# Patient Record
Sex: Male | Born: 1960
Health system: Southern US, Community
[De-identification: ages and names within clinical notes are randomized; demographics above are authoritative.]

## PROBLEM LIST (undated history)

## (undated) DIAGNOSIS — I5030 Unspecified diastolic (congestive) heart failure: Secondary | ICD-10-CM

## (undated) DIAGNOSIS — I272 Pulmonary hypertension, unspecified: Secondary | ICD-10-CM

## (undated) DIAGNOSIS — J9691 Respiratory failure, unspecified with hypoxia: Secondary | ICD-10-CM

## (undated) DIAGNOSIS — J449 Chronic obstructive pulmonary disease, unspecified: Secondary | ICD-10-CM

## (undated) DIAGNOSIS — K219 Gastro-esophageal reflux disease without esophagitis: Secondary | ICD-10-CM

## (undated) DIAGNOSIS — I2723 Pulmonary hypertension due to lung diseases and hypoxia: Secondary | ICD-10-CM

## (undated) DIAGNOSIS — J40 Bronchitis, not specified as acute or chronic: Secondary | ICD-10-CM

## (undated) HISTORY — DX: Respiratory failure, unspecified with hypoxia: J96.91

## (undated) HISTORY — DX: Gastro-esophageal reflux disease without esophagitis: K21.9

## (undated) HISTORY — DX: Chronic obstructive pulmonary disease, unspecified: J44.9

## (undated) HISTORY — DX: Unspecified diastolic (congestive) heart failure: I50.30

## (undated) HISTORY — DX: Pulmonary hypertension, unspecified: I27.20

## (undated) HISTORY — DX: Pulmonary hypertension due to lung diseases and hypoxia: I27.23

## (undated) HISTORY — PX: HERNIA REPAIR: SHX51

---

## 2014-08-02 ENCOUNTER — Emergency Department (HOSPITAL_COMMUNITY)
Admission: EM | Admit: 2014-08-02 | Discharge: 2014-08-02 | Disposition: A | Discharge status: Home / Self Care | Payer: Self-pay | Attending: Emergency Medicine | Admitting: Emergency Medicine

## 2014-08-02 ENCOUNTER — Emergency Department (HOSPITAL_COMMUNITY): Payer: Self-pay

## 2014-08-02 ENCOUNTER — Encounter (HOSPITAL_COMMUNITY): Payer: Self-pay | Admitting: Emergency Medicine

## 2014-08-02 DIAGNOSIS — H6122 Impacted cerumen, left ear: Secondary | ICD-10-CM | POA: Insufficient documentation

## 2014-08-02 DIAGNOSIS — J40 Bronchitis, not specified as acute or chronic: Secondary | ICD-10-CM | POA: Insufficient documentation

## 2014-08-02 DIAGNOSIS — Z720 Tobacco use: Secondary | ICD-10-CM | POA: Insufficient documentation

## 2014-08-02 MED ORDER — HYDROCODONE-ACETAMINOPHEN 7.5-325 MG/15ML PO SOLN: 15.0000 mL | Freq: Every evening | ORAL | Status: DC | PRN

## 2014-08-02 MED ORDER — DM-GUAIFENESIN ER 30-600 MG PO TB12: 1.0000 | ORAL_TABLET | Freq: Two times a day (BID) | ORAL | Status: DC

## 2014-08-02 MED ORDER — ALBUTEROL SULFATE HFA 108 (90 BASE) MCG/ACT IN AERS: 1.0000 | INHALATION_SPRAY | Freq: Four times a day (QID) | RESPIRATORY_TRACT | Status: DC | PRN

## 2014-08-02 NOTE — ED Provider Notes (Signed)
CSN: [id number]     Arrival date & time 08/02/14  1557 History   This chart was scribed for non-physician practitioner, Lorrine Kin, PA-C working with [place], DO by Evelene Croon, ED Scribe. This patient was seen in room WTR8/WTR8 and the patient's care was started at 6:20 PM.    Chief Complaint  Patient presents with  . Cough    HPI Comments: Anthony Nelson is a 54 y.o. male who presents to the Emergency Department complaining of a persistent cough and chest congestion for a few weeks. Pt reports associated subjective fever, chills and night sweats.  That started last night and resolved this am. Pt has been taking ibuprofen with relief of fever; his last dose was this am around 1000. Pt also reports mild wheezing. He is a current everyday smoker. He denies CP, rash, and SOB. He also denies sick contacts but notes he works in a [place].   The history is provided by the patient. No language interpreter was used.    History reviewed. No pertinent past medical history. History reviewed. No pertinent past surgical history. History reviewed. No pertinent family history. History  Substance Use Topics  . Smoking status: Current Every Day Smoker -- 0.50 packs/day    Types: Cigarettes  . Smokeless tobacco: Not on file  . Alcohol Use: Yes     Comment: Socially     Review of Systems  Constitutional: Positive for fever, chills and diaphoresis.  HENT: Positive for congestion.   Respiratory: Positive for cough and wheezing. Negative for shortness of breath.   Cardiovascular: Negative for chest pain.  Skin: Negative for rash.  All other systems reviewed and are negative.     Allergies  Erythromycin  Home Medications   Prior to Admission medications   Not on File   BP 124/95 mmHg  Pulse 95  Temp(Src) 98 F (36.7 C) (Oral)  Resp 18  SpO2 98% Physical Exam  Constitutional: He is oriented to person, place, and time. He appears well-developed and well-nourished. No distress.   HENT:  Head: Normocephalic and atraumatic.  Left Ear: Tympanic membrane normal.  Nose: Rhinorrhea present.  Mouth/Throat: Posterior oropharyngeal erythema present. No oropharyngeal exudate, posterior oropharyngeal edema or tonsillar abscesses.  Left canal occluded by cerumen.  Neck: Neck supple.  Cardiovascular: Normal rate and regular rhythm.   Pulmonary/Chest: Effort normal. No respiratory distress. He has no wheezes. He has no rales.  Lymphadenopathy:       Head (right side): No tonsillar adenopathy present.       Head (left side): No tonsillar adenopathy present.       Right cervical: No superficial cervical and no posterior cervical adenopathy present.      Left cervical: No superficial cervical and no posterior cervical adenopathy present.  Neurological: He is oriented to person, place, and time.  Skin: Skin is warm and dry. He is not diaphoretic.  Psychiatric: He has a normal mood and affect. His behavior is normal.  Nursing note and vitals reviewed.   ED Course  Procedures   DIAGNOSTIC STUDIES:  Oxygen Saturation is 98% on RA, normal by my interpretation.    COORDINATION OF CARE:  6:31 PM Updated pt with XR results. Will discharge with inhaler and referral to PCP. Discussed treatment plan with pt and wife at bedside and pt agreed to plan.  Labs Review Labs Reviewed - No data to display  Imaging Review Dg Chest 2 View  08/02/2014   CLINICAL DATA:  Shortness of  breath and cough for 2 weeks.  Smoker.  EXAM: CHEST  2 VIEW  COMPARISON:  None.  FINDINGS: The chest is hyperexpanded with attenuation of the pulmonary vasculature. The lungs are clear. Heart size is normal. There is no pneumothorax or pleural effusion.  IMPRESSION: Emphysema without acute disease.   Electronically Signed   By: Inge Rise M.D.   On: 08/02/2014 16:46     EKG Interpretation None      MDM   Final diagnoses:  Bronchitis   Patient with cough, afebrile in ED, SPO2 98%, chest x-ray  negative for acute changes. Plan to treat for viral bronchitis. Resources given. Discussed imaging results, and treatment plan with the patient. Return precautions given. Reports understanding and no other concerns at this time.  Patient is stable for discharge at this time.  Meds given in ED:  Medications - No data to display  New Prescriptions   ALBUTEROL (PROVENTIL HFA;VENTOLIN HFA) 108 (90 BASE) MCG/ACT INHALER    Inhale 1 puff into the lungs every 6 (six) hours as needed for wheezing or shortness of breath.   DEXTROMETHORPHAN-GUAIFENESIN (MUCINEX DM) 30-600 MG PER 12 HR TABLET    Take 1 tablet by mouth 2 (two) times daily.   HYDROCODONE-ACETAMINOPHEN (HYCET) 7.5-325 MG/15 ML SOLUTION    Take 15 mLs by mouth at bedtime as needed (cough).   I personally performed the services described in this documentation, which was scribed in my presence. The recorded information has been reviewed and is accurate.     Harvie Heck, PA-C 08/02/14 [place], DO 08/03/14 [id number]

## 2014-08-02 NOTE — ED Notes (Signed)
Pt c/o chills, night sweats, nasal congestion.

## 2014-08-02 NOTE — ED Notes (Signed)
Pt reports a cough that has off and on for a couple of weeks. Pt reports that last night he had a sharp pain in his right rib cage area, no pain at this time.

## 2014-08-02 NOTE — Discharge Instructions (Signed)
Call for a follow up appointment with a Family or Primary Care Provider.  Return if Symptoms worsen.   Take medication as prescribed.  Ibuprofen and tylenol for body aches and pain. Do not drink alcohol or operate heavy machinery while taking cough syrup.  Emergency Department Resource Guide 1) Find a Doctor and Pay Out of Pocket Although you won't have to find out who is covered by your insurance plan, it is a good idea to ask around and get recommendations. You will then need to call the office and see if the doctor you have chosen will accept you as a new patient and what types of options they offer for patients who are self-pay. Some doctors offer discounts or will set up payment plans for their patients who do not have insurance, but you will need to ask so you aren't surprised when you get to your appointment.  2) Contact Your Local Health Department Not all health departments have doctors that can see patients for sick visits, but many do, so it is worth a call to see if yours does. If you don't know where your local health department is, you can check in your phone book. The CDC also has a tool to help you locate your state's health department, and many state websites also have listings of all of their local health departments.  3) Find a [facility] If your illness is not likely to be very severe or complicated, you may want to try a walk in clinic. These are popping up all over the country in pharmacies, drugstores, and shopping centers. They're usually staffed by nurse practitioners or physician assistants that have been trained to treat common illnesses and complaints. They're usually fairly quick and inexpensive. However, if you have serious medical issues or chronic medical problems, these are probably not your best option.  No Primary Care Doctor: - Call Health Connect at  [phone] - they can help you locate a primary care doctor that  accepts your insurance, provides certain  services, etc. - Physician Referral Service- [phone]  Chronic Pain Problems: Organization         Address  Phone   Notes  [facility]  [phone] Patients need to be referred by their primary care doctor.   Medication Assistance: Organization         Address  Phone   Notes  [facility] Medication [facility] [address] [place], [place] [address] [phone] --Must be a resident of [facility] -- Must have NO insurance coverage whatsoever (no Medicaid/ Medicare, etc.) -- The pt. MUST have a primary care doctor that directs their care regularly and follows them in the community   MedAssist  [phone]   [employer]  [phone]    Agencies that provide inexpensive medical care: Organization         Address  Phone   Notes  [place]  [phone]   [facility]    [phone]   [facility] [place], [place] [address] [phone]   [place] [address], [place] [phone]   Planned Parenthood    ([phone]   [facility]    [phone]   [place] [address], [place] Phone:  [phone], Fax:  [phone] Hours of Operation:  9 am - 6 pm, M-F.  Also accepts Medicaid/Medicare and self-pay.  [facility] for [address], [address], [place] Phone: [phone], Fax: [phone]. Hours of Operation:  8:30 am - 5:30 pm, M-F.  Also accepts Medicaid and self-pay.  [facility] [address] [place] Phone: [phone]   [place], [place], [place] [phone], Ext. [phone] Mondays & Thursdays: 7-9 AM.  First 15 patients are seen on a first come, first serve basis.    [place] Providers:  Organization         Address  Phone   Notes  [facility] [address], [place] [phone] Also accepts self-pay patients.  [facility] [address] [place], [place]  [phone]   [place], [address], [place] [phone]   [facility] [address], [place] [phone]   Lucianne Lei [address], [place]   [phone] Only accepts [place] Access [place] patients after they have their name applied to their card.   Self-Pay (no insurance) in [facility] Xxx:  Organization         Address  Phone   Notes  Sickle Cell Patients, [facility] [place] [phone]   [facility] [place] [phone]   [facility] [place]  [place], [place], [place] [phone]   [facility]/Dr. Osei-Bonsu  [address] [place] or [address], [place] [phone] Phone number for both [place] and [place] locations is the same.  Urgent Medical and [facility] [address], [place] [phone]   [facility] [address] [place] or [address] [phone]   [facility] [address] [place] [phone], phone; [phone], fax Sees patients 1st and 3rd Saturday of every month.  Must not qualify for public or private insurance (i.e. Medicaid, Medicare, [place] Health Choice, Veterans' Benefits)  Household income should be no more than 200% of the poverty level The clinic cannot treat you if you are pregnant or think you are pregnant  Sexually transmitted diseases are not treated at the clinic.    Dental Care: Organization         Address  Phone  Notes  [facility] Department of [facility] [place] [phone] Accepts  children up to age 57 who are enrolled in [place] or [place]; pregnant women with a Medicaid card; and children who have applied for Medicaid or [place] Health Choice, but were declined, whose parents can pay a reduced fee at time of service.  [facility] Department of [facility]  [address], [place] [phone] Accepts children up to age 80 who are enrolled in [place] or [place]; pregnant women with a Medicaid card; and children who have applied for Medicaid or [place] Health Choice, but were declined, whose parents can pay a reduced fee at time of service.  [facility] Adult [facility] Access PROGRAM  [place] [phone] Patients are seen by appointment only. Walk-ins are not accepted. [place] will see patients 42 years of age and older. Monday - Tuesday (  8am-5pm) Most Wednesdays (8:30-5pm) $30 per visit, cash only  [facility] Adult Dental Access PROGRAM  [address], [facility] [phone] Patients are seen by appointment only. Walk-ins are not accepted. [place] will see patients 78 years of age and older. One Wednesday Evening (Monthly: Volunteer Based).  $30 per visit, cash only  [place]  [phone] for adults; Children under age 55, call Graduate Pediatric Dentistry at [phone]. Children aged 17-14, please call [phone] to request a pediatric application.  Dental services are provided in all areas of dental care including fillings, crowns and bridges, complete and partial dentures, implants, gum treatment, root canals, and extractions. Preventive care is also provided. Treatment is provided to both adults and children. Patients are selected via a lottery and there is often a waiting list.   [facility], [facility] [address], [place]  [phone] [email or web address]   [facility] [address] [place], [place] [phone], Ext. [phone] Second and  Fourth Thursday of each month, opens at 6:30 AM; Clinic ends at 9 AM.  Patients are seen on a first-come first-served basis, and a limited number are seen during each clinic.   [facility]  [address] Hillard Danker [place], [place] [phone]   Eligibility Requirements You must have lived in [place], [place], or [place] counties for at least the last three months.   You cannot be eligible for state or federal sponsored [employer], including [employer], [place], or [employer].   You generally cannot be eligible for healthcare insurance through your employer.    How to apply: Eligibility screenings are held every Tuesday and Wednesday afternoon from 1:00 pm until 4:00 pm. You do not need an appointment for the interview!  [facility] [address] [place], [place]   Head of the [place]  [place] Department  [place]  [phone]    Behavioral Health Resources in the Community: Intensive Outpatient Programs Organization         Address  Phone  Notes  [place]. [address] [place], [place] [phone]   [facility] [address], [place], [place]   ADS: Alcohol & Drug Svcs [address] [place], [place]   [place] [address],  [place], [place] or [phone]   Substance Abuse Resources Organization         Address  Phone  Notes  Alcohol and Drug Services  [phone]   [place]  [phone]   [facility] [place]   Chinita Pester  [phone]   Residential & Outpatient Substance Abuse Program  [phone]   Psychological Services Organization         Address  Phone  Notes  [facility] [place]  [place]  [phone]   [place]  [address], [place] or [phone]    Mobile Crisis Teams Organization         Address  Phone  Notes  [facility], Mobile Crisis Care Unit  [phone]   Assertive Psychotherapeutic Services  [address] [place], [place]   [place] [address], [place] [phone]    Self-Help/Support Groups Organization         Address  Phone  Notes  Mental Health Assoc. of [place] - variety of support groups  [place] Call for more information  Narcotics Anonymous (NA), [facility] [address], [employer] [place]  2 meetings at this location   Special educational needs teacher         Address  Phone  Notes  ASAP Residential Treatment [place],    [place]  [phone]   [facility]  [address], [place] [address], [place], [place]   [place], [place] [phone] Admissions: 8am-3pm M-F  Incentives Substance [place] [address]    [place], [place] [phone]   [facility] [address] [place], [place], [place]   [facility] [address]  [place], [place]   [facility] Programs - Intensive Outpatient [address], [place], [place]   [facility] ([place] [place].,  [place], [place] [phone] or [phone]   [facility] (RTS) [address] [place], [place] Accepts Medicaid  [facility] [place] [address],  [place] [phone] Substance Abuse/Addiction Treatment   [facility] Organization         Address  Phone  Notes  [facility]  [phone]   Domenic Schwab, PhD [address] Arlis [place], [place]   [phone] or [phone]   [place], [place] [phone]   [facility] [address], [place], [place] [phone] Insurance/Medicaid/sponsorship through [facility] and Families [address]                                    [place], [place] [phone] [place] [address], [place] [phone]    Dr. Adele Schilder  [phone]   [facility] Dept. 1) [address], [place] 2) [place] 3)  [place] [address], [place] [phone]  [phone]   [place] [phone] or [phone] (After Hours)

## 2018-02-08 ENCOUNTER — Emergency Department (HOSPITAL_COMMUNITY): Payer: Self-pay

## 2018-02-08 ENCOUNTER — Inpatient Hospital Stay (HOSPITAL_COMMUNITY)
Admission: EM | Admit: 2018-02-08 | Discharge: 2018-02-10 | DRG: 190 | Disposition: A | Discharge status: Home / Self Care | Payer: Self-pay | Attending: Family Medicine | Admitting: Family Medicine

## 2018-02-08 ENCOUNTER — Other Ambulatory Visit: Payer: Self-pay

## 2018-02-08 ENCOUNTER — Inpatient Hospital Stay (HOSPITAL_COMMUNITY): Payer: Self-pay

## 2018-02-08 ENCOUNTER — Encounter (HOSPITAL_COMMUNITY): Payer: Self-pay

## 2018-02-08 DIAGNOSIS — J441 Chronic obstructive pulmonary disease with (acute) exacerbation: Principal | ICD-10-CM | POA: Diagnosis present

## 2018-02-08 DIAGNOSIS — J9601 Acute respiratory failure with hypoxia: Secondary | ICD-10-CM | POA: Diagnosis present

## 2018-02-08 DIAGNOSIS — K219 Gastro-esophageal reflux disease without esophagitis: Secondary | ICD-10-CM | POA: Diagnosis present

## 2018-02-08 DIAGNOSIS — Z881 Allergy status to other antibiotic agents status: Secondary | ICD-10-CM

## 2018-02-08 DIAGNOSIS — F17210 Nicotine dependence, cigarettes, uncomplicated: Secondary | ICD-10-CM | POA: Diagnosis present

## 2018-02-08 DIAGNOSIS — I2729 Other secondary pulmonary hypertension: Secondary | ICD-10-CM | POA: Diagnosis present

## 2018-02-08 DIAGNOSIS — I5031 Acute diastolic (congestive) heart failure: Secondary | ICD-10-CM | POA: Diagnosis present

## 2018-02-08 DIAGNOSIS — K761 Chronic passive congestion of liver: Secondary | ICD-10-CM | POA: Diagnosis present

## 2018-02-08 DIAGNOSIS — I361 Nonrheumatic tricuspid (valve) insufficiency: Secondary | ICD-10-CM

## 2018-02-08 DIAGNOSIS — M7989 Other specified soft tissue disorders: Secondary | ICD-10-CM

## 2018-02-08 DIAGNOSIS — I5082 Biventricular heart failure: Secondary | ICD-10-CM | POA: Diagnosis present

## 2018-02-08 DIAGNOSIS — Z87891 Personal history of nicotine dependence: Secondary | ICD-10-CM

## 2018-02-08 HISTORY — DX: Bronchitis, not specified as acute or chronic: J40

## 2018-02-08 LAB — I-STAT CHEM 8, ED
BUN: 24 mg/dL — AB (ref 6–20)
CREATININE: 0.9 mg/dL (ref 0.61–1.24)
Calcium, Ion: 0.96 mmol/L — ABNORMAL LOW (ref 1.15–1.40)
Chloride: 91 mmol/L — ABNORMAL LOW (ref 98–111)
Glucose, Bld: 108 mg/dL — ABNORMAL HIGH (ref 70–99)
HCT: 56 % — ABNORMAL HIGH (ref 39.0–52.0)
HEMOGLOBIN: 19 g/dL — AB (ref 13.0–17.0)
POTASSIUM: 3.4 mmol/L — AB (ref 3.5–5.1)
Sodium: 136 mmol/L (ref 135–145)
TCO2: 35 mmol/L — ABNORMAL HIGH (ref 22–32)

## 2018-02-08 LAB — URINALYSIS, ROUTINE W REFLEX MICROSCOPIC
Bacteria, UA: NONE SEEN
Bilirubin Urine: NEGATIVE
GLUCOSE, UA: NEGATIVE mg/dL
Hgb urine dipstick: NEGATIVE
Ketones, ur: NEGATIVE mg/dL
Leukocytes, UA: NEGATIVE
Nitrite: NEGATIVE
PROTEIN: 30 mg/dL — AB
Specific Gravity, Urine: >1.046 — ABNORMAL HIGH (ref 1.005–1.030)
pH: 6 (ref 5.0–8.0)

## 2018-02-08 LAB — CBC WITH DIFFERENTIAL/PLATELET
Basophils Absolute: 0 10*3/uL (ref 0.0–0.1)
Basophils Relative: 0 %
Eosinophils Absolute: 0 10*3/uL (ref 0.0–0.7)
Eosinophils Relative: 1 %
HCT: 52.8 % — ABNORMAL HIGH (ref 39.0–52.0)
Hemoglobin: 17.8 g/dL — ABNORMAL HIGH (ref 13.0–17.0)
LYMPHS ABS: 1 10*3/uL (ref 0.7–4.0)
LYMPHS PCT: 18 %
MCH: 34.7 pg — ABNORMAL HIGH (ref 26.0–34.0)
MCHC: 33.7 g/dL (ref 30.0–36.0)
MCV: 102.9 fL — AB (ref 78.0–100.0)
Monocytes Absolute: 0.5 10*3/uL (ref 0.1–1.0)
Monocytes Relative: 9 %
NEUTROS PCT: 72 %
Neutro Abs: 4.1 10*3/uL (ref 1.7–7.7)
Platelets: 170 10*3/uL (ref 150–400)
RBC: 5.13 MIL/uL (ref 4.22–5.81)
RDW: 14.5 % (ref 11.5–15.5)
WBC: 5.7 10*3/uL (ref 4.0–10.5)

## 2018-02-08 LAB — COMPREHENSIVE METABOLIC PANEL
ALT: 135 U/L — ABNORMAL HIGH (ref 0–44)
AST: 69 U/L — AB (ref 15–41)
Albumin: 3.6 g/dL (ref 3.5–5.0)
Alkaline Phosphatase: 66 U/L (ref 38–126)
Anion gap: 12 (ref 5–15)
BUN: 23 mg/dL — AB (ref 6–20)
CO2: 33 mmol/L — ABNORMAL HIGH (ref 22–32)
Calcium: 8.5 mg/dL — ABNORMAL LOW (ref 8.9–10.3)
Chloride: 94 mmol/L — ABNORMAL LOW (ref 98–111)
Creatinine, Ser: 0.88 mg/dL (ref 0.61–1.24)
GFR calc non Af Amer: >60 mL/min (ref >60)
Glucose, Bld: 109 mg/dL — ABNORMAL HIGH (ref 70–99)
POTASSIUM: 3.7 mmol/L (ref 3.5–5.1)
Sodium: 139 mmol/L (ref 135–145)
Total Bilirubin: 0.9 mg/dL (ref 0.3–1.2)
Total Protein: 6.4 g/dL — ABNORMAL LOW (ref 6.5–8.1)

## 2018-02-08 LAB — ECHOCARDIOGRAM COMPLETE
Height: 72 in
Weight: 2064 oz

## 2018-02-08 LAB — TROPONIN I
TROPONIN I: 0.03 ng/mL — AB (ref <0.03)
Troponin I: 0.03 ng/mL (ref <0.03)
Troponin I: <0.03 ng/mL (ref <0.03)

## 2018-02-08 LAB — BRAIN NATRIURETIC PEPTIDE: B NATRIURETIC PEPTIDE 5: 825.4 pg/mL — AB (ref 0.0–100.0)

## 2018-02-08 LAB — I-STAT CG4 LACTIC ACID, ED
LACTIC ACID, VENOUS: 0.79 mmol/L (ref 0.5–1.9)
Lactic Acid, Venous: 2.05 mmol/L (ref 0.5–1.9)

## 2018-02-08 LAB — I-STAT TROPONIN, ED: Troponin i, poc: 0.03 ng/mL (ref 0.00–0.08)

## 2018-02-08 MED ORDER — GUAIFENESIN-DM 100-10 MG/5ML PO SYRP: 5.0000 mL | ORAL_SOLUTION | ORAL | Status: DC | PRN

## 2018-02-08 MED ORDER — ACETAMINOPHEN 325 MG PO TABS: 650.0000 mg | ORAL_TABLET | Freq: Four times a day (QID) | ORAL | Status: DC | PRN

## 2018-02-08 MED ORDER — ALBUTEROL SULFATE (2.5 MG/3ML) 0.083% IN NEBU: 2.5000 mg | INHALATION_SOLUTION | RESPIRATORY_TRACT | Status: DC | PRN

## 2018-02-08 MED ORDER — ENOXAPARIN SODIUM 40 MG/0.4ML ~~LOC~~ SOLN
40.0000 mg | SUBCUTANEOUS | Status: DC
  Administered 2018-02-08 – 2018-02-09 (×2): 40 mg via SUBCUTANEOUS
  Filled 2018-02-08 (×2): qty 0.4

## 2018-02-08 MED ORDER — NICOTINE 14 MG/24HR TD PT24
14.0000 mg | MEDICATED_PATCH | Freq: Every day | TRANSDERMAL | Status: DC
  Administered 2018-02-08 – 2018-02-10 (×3): 14 mg via TRANSDERMAL
  Filled 2018-02-08 (×3): qty 1

## 2018-02-08 MED ORDER — ALBUTEROL SULFATE (2.5 MG/3ML) 0.083% IN NEBU
5.0000 mg | INHALATION_SOLUTION | Freq: Once | RESPIRATORY_TRACT | Status: AC
  Administered 2018-02-08: 5 mg via RESPIRATORY_TRACT
  Filled 2018-02-08: qty 6

## 2018-02-08 MED ORDER — POLYETHYLENE GLYCOL 3350 17 G PO PACK
17.0000 g | PACK | Freq: Every day | ORAL | Status: DC | PRN
  Administered 2018-02-09: 17 g via ORAL
  Filled 2018-02-08: qty 1

## 2018-02-08 MED ORDER — METHYLPREDNISOLONE SODIUM SUCC 125 MG IJ SOLR
125.0000 mg | Freq: Once | INTRAMUSCULAR | Status: AC
  Administered 2018-02-08: 125 mg via INTRAVENOUS
  Filled 2018-02-08: qty 2

## 2018-02-08 MED ORDER — FUROSEMIDE 10 MG/ML IJ SOLN
40.0000 mg | Freq: Two times a day (BID) | INTRAMUSCULAR | Status: DC
  Administered 2018-02-08: 40 mg via INTRAVENOUS
  Filled 2018-02-08 (×2): qty 4

## 2018-02-08 MED ORDER — IPRATROPIUM-ALBUTEROL 0.5-2.5 (3) MG/3ML IN SOLN
3.0000 mL | Freq: Three times a day (TID) | RESPIRATORY_TRACT | Status: DC
  Administered 2018-02-08 – 2018-02-10 (×6): 3 mL via RESPIRATORY_TRACT
  Filled 2018-02-08 (×7): qty 3

## 2018-02-08 MED ORDER — ACETAMINOPHEN 650 MG RE SUPP: 650.0000 mg | Freq: Four times a day (QID) | RECTAL | Status: DC | PRN

## 2018-02-08 MED ORDER — ORAL CARE MOUTH RINSE
15.0000 mL | Freq: Two times a day (BID) | OROMUCOSAL | Status: DC
  Administered 2018-02-08 – 2018-02-10 (×2): 15 mL via OROMUCOSAL

## 2018-02-08 MED ORDER — ONDANSETRON HCL 4 MG PO TABS: 4.0000 mg | ORAL_TABLET | Freq: Four times a day (QID) | ORAL | Status: DC | PRN

## 2018-02-08 MED ORDER — METHYLPREDNISOLONE SODIUM SUCC 40 MG IJ SOLR
40.0000 mg | Freq: Two times a day (BID) | INTRAMUSCULAR | Status: DC
  Administered 2018-02-09 – 2018-02-10 (×3): 40 mg via INTRAVENOUS
  Filled 2018-02-08 (×3): qty 1

## 2018-02-08 MED ORDER — IPRATROPIUM-ALBUTEROL 0.5-2.5 (3) MG/3ML IN SOLN
3.0000 mL | Freq: Once | RESPIRATORY_TRACT | Status: AC
  Administered 2018-02-08: 3 mL via RESPIRATORY_TRACT
  Filled 2018-02-08: qty 3

## 2018-02-08 MED ORDER — DOXYCYCLINE HYCLATE 100 MG PO TABS
100.0000 mg | ORAL_TABLET | Freq: Two times a day (BID) | ORAL | Status: DC
  Administered 2018-02-08 – 2018-02-10 (×5): 100 mg via ORAL
  Filled 2018-02-08 (×5): qty 1

## 2018-02-08 MED ORDER — IOPAMIDOL (ISOVUE-370) INJECTION 76%
INTRAVENOUS | Status: AC
  Administered 2018-02-08: 100 mL
  Filled 2018-02-08: qty 100

## 2018-02-08 MED ORDER — ONDANSETRON HCL 4 MG/2ML IJ SOLN
4.0000 mg | Freq: Four times a day (QID) | INTRAMUSCULAR | Status: DC | PRN
Start: 2018-02-08 — End: 2018-02-10

## 2018-02-08 MED ORDER — IPRATROPIUM-ALBUTEROL 0.5-2.5 (3) MG/3ML IN SOLN: 3.0000 mL | Freq: Four times a day (QID) | RESPIRATORY_TRACT | Status: DC

## 2018-02-08 NOTE — ED Notes (Signed)
BLOOD CULTURE X 1 OBTAINED RT FOREARM

## 2018-02-08 NOTE — ED Notes (Signed)
ADMITTING Provider at bedside.

## 2018-02-08 NOTE — ED Notes (Signed)
BLOOD CULTURES DISCARDED PER ADMISSION MD.

## 2018-02-08 NOTE — Progress Notes (Signed)
Patient O2 decreased to 1 L post-tx; patient 97% on 2 L [place]. RT will continue to monitor patient.

## 2018-02-08 NOTE — Progress Notes (Signed)
  Echocardiogram 2D Echocardiogram has been performed.  Anthony Nelson 02/08/2018, 3:37 PM

## 2018-02-08 NOTE — ED Triage Notes (Signed)
Patient c/o feeling fatigue x 2 weeks and bilateral leg swelling x 2 days. Patient also reports tht he is SOB at times. Sats in triage 73%on room air.

## 2018-02-08 NOTE — ED Notes (Signed)
Date and time results received: 02/08/18  (use smartphrase ".now" to insert current time)8:52 AM Test: LACTIC Critical Value: 2.05  Name of Provider Notified: EDP REES  Orders Received? Or Actions Taken? NEW ORDERS

## 2018-02-08 NOTE — ED Notes (Signed)
AWARE OF NEED FOR URINE. URINAL AT BEDSIDE

## 2018-02-08 NOTE — ED Notes (Signed)
MD AND RN NOTIFIED OF PATIENT'S LACTIC ACID OF 2.05

## 2018-02-08 NOTE — ED Notes (Signed)
ED Provider at bedside. EDP REES AT BEDSIDE. DISCUSSED RESULTS AND PLAN OF CARE. DECREASED 02 TO 2LNC AND WILL REASSESS PT. PT WITHOUT PAIN AND COMFORTABLE

## 2018-02-08 NOTE — ED Provider Notes (Signed)
[facility] DEPT Provider Note   CSN: [id number] Arrival date & time: 02/08/18  0746     History   Chief Complaint Chief Complaint  Patient presents with  . Fatigue  . Leg Swelling  . Shortness of Breath    HPI Anthony Nelson is a 57 y.o. male.  The history is provided by the patient. No language interpreter was used.  Shortness of Breath    Anthony Nelson is a 57 y.o. male who presents to the Emergency Department complaining of fatigue. He presents to the emergency department complaining of fatigue and generalized weakness for the last few weeks. Yesterday he noted swelling to his legs. He does endorse small amount of bleeding at the end of bowel movements that is been ongoing for a long time. He attributes this bleeding to hemorrhoids. He denies any fevers, chest pain, abdominal pain, nausea, vomiting, diarrhea. He does have occasional cough productive of phlegm. He has no known medical problems and takes no medications. He does smoke. Occasional alcohol, no drug use. Past Medical History:  Diagnosis Date  . Bronchitis     Patient Active Problem List   Diagnosis Date Noted  . COPD exacerbation ([place]) 02/08/2018  . Leg swelling 02/08/2018  . Tobacco abuse 02/08/2018    Past Surgical History:  Procedure Laterality Date  . HERNIA REPAIR          Home Medications    Prior to Admission medications   Medication Sig Start Date End Date Taking? Authorizing Provider  ibuprofen (ADVIL,MOTRIN) 200 MG tablet Take 800 mg by mouth daily as needed for moderate pain.   Yes [provider]    Family History Family History  Problem Relation Age of Onset  . Cancer Mother   . Cancer Father     Social History Social History   Tobacco Use  . Smoking status: Current Every Day Smoker    Packs/day: 0.50    Types: Cigarettes  . Smokeless tobacco: Never Used  Substance Use Topics  . Alcohol use: Yes    Comment: Socially   . Drug use: No       Allergies   Erythromycin   Review of Systems Review of Systems  Respiratory: Positive for shortness of breath.   All other systems reviewed and are negative.    Physical Exam Updated Vital Signs BP 123/85 (BP Location: Left Arm)   Pulse 96   Temp 97.9 F (36.6 C) (Oral)   Resp 18   Ht 6' (1.829 m)   Wt 58.5 kg   SpO2 92%   BMI 17.50 kg/m   Physical Exam  Constitutional: He is oriented to person, place, and time. He appears well-developed and well-nourished.  HENT:  Head: Normocephalic and atraumatic.  Cardiovascular: Regular rhythm.  No murmur heard. tachycardic  Pulmonary/Chest: Effort normal. No respiratory distress.  Tachypneic.  Decreased air movement bilaterally  Abdominal: Soft. There is no tenderness. There is no rebound and no guarding.  Musculoskeletal: He exhibits no tenderness.  2-3+ pitting edema to LLE. 1+ pitting edema to RLE.  Neurological: He is alert and oriented to person, place, and time.  Skin: Skin is warm and dry.  Psychiatric: He has a normal mood and affect. His behavior is normal.  Nursing note and vitals reviewed.    ED Treatments / Results  Labs (all labs ordered are listed, but only abnormal results are displayed) Labs Reviewed  URINALYSIS, ROUTINE W REFLEX MICROSCOPIC - Abnormal; Notable for the following components:  Result Value   Specific Gravity, Urine >1.046 (*)    Protein, ur 30 (*)    All other components within normal limits  COMPREHENSIVE METABOLIC PANEL - Abnormal; Notable for the following components:   Chloride 94 (*)    CO2 33 (*)    Glucose, Bld 109 (*)    BUN 23 (*)    Calcium 8.5 (*)    Total Protein 6.4 (*)    AST 69 (*)    ALT 135 (*)    All other components within normal limits  CBC WITH DIFFERENTIAL/PLATELET - Abnormal; Notable for the following components:   Hemoglobin 17.8 (*)    HCT 52.8 (*)    MCV 102.9 (*)    MCH 34.7 (*)    All other components within normal limits  TROPONIN I -  Abnormal; Notable for the following components:   Troponin I 0.03 (*)    All other components within normal limits  BRAIN NATRIURETIC PEPTIDE - Abnormal; Notable for the following components:   B Natriuretic Peptide 825.4 (*)    All other components within normal limits  I-STAT CHEM 8, ED - Abnormal; Notable for the following components:   Potassium 3.4 (*)    Chloride 91 (*)    BUN 24 (*)    Glucose, Bld 108 (*)    Calcium, Ion 0.96 (*)    TCO2 35 (*)    Hemoglobin 19.0 (*)    HCT 56.0 (*)    All other components within normal limits  I-STAT CG4 LACTIC ACID, ED - Abnormal; Notable for the following components:   Lactic Acid, Venous 2.05 (*)    All other components within normal limits  HIV ANTIBODY (ROUTINE TESTING)  TROPONIN I  TROPONIN I  HEPATITIS PANEL, ACUTE  I-STAT TROPONIN, ED  I-STAT CG4 LACTIC ACID, ED    EKG EKG Interpretation  Date/Time:  Saturday February 08 2018 08:16:21 EDT Ventricular Rate:  95 PR Interval:    QRS Duration: 94 QT Interval:  394 QTC Calculation: 496 R Axis:   124 Text Interpretation:  Sinus rhythm Biatrial enlargement Probable RVH w/ secondary repol abnormality Borderline prolonged QT interval baseline wander in II, III,  aVR, aVL, aVF, V4, V5, V6 no prior available for comparison Confirmed by Quintella Reichert [phone]) on 02/08/2018 8:43:21 AM   Radiology Ct Angio Chest Pe W/cm &/or Wo Cm  Result Date: 02/08/2018 CLINICAL DATA:  Fatigue, shortness of breath, hypoxia and bilateral lower extremity edema. EXAM: CT ANGIOGRAPHY CHEST WITH CONTRAST TECHNIQUE: Multidetector CT imaging of the chest was performed using the standard protocol during bolus administration of intravenous contrast. Multiplanar CT image reconstructions and MIPs were obtained to evaluate the vascular anatomy. CONTRAST:  145m ISOVUE-370 IOPAMIDOL (ISOVUE-370) INJECTION 76% COMPARISON:  Chest x-ray earlier today. FINDINGS: Cardiovascular: Satisfactory opacification of the pulmonary  arteries to the segmental level. No evidence of pulmonary embolism. Central pulmonary arteries are normal in caliber. Right ventricle and right atrium are dilated and there is some reflux of contrast into the intrahepatic IVC and central hepatic veins. Findings are consistent with some component of underlying right heart failure and elevated right heart pressures. The thoracic aorta is poorly opacified but shows normal caliber without evidence of aneurysmal disease. No pericardial fluid identified. No significant calcified coronary artery plaque identified. Mediastinum/Nodes: No enlarged mediastinal, hilar, or axillary lymph nodes. Thyroid gland, trachea, and esophagus demonstrate no significant findings. Lungs/Pleura: There is evidence of moderately advanced emphysematous lung disease throughout both lungs and predominantly in the  upper lung zones. Mild scarring present at both lung bases. There is no evidence of pulmonary edema, consolidation, pneumothorax, nodule or pleural fluid. Upper Abdomen: No acute abnormality. Musculoskeletal: No chest wall abnormality. No acute or significant osseous findings. Review of the MIP images confirms the above findings. IMPRESSION: 1. No evidence of pulmonary embolism. 2. Evidence of elevated right heart pressures and underlying right heart failure with dilated right-sided heart chambers and reflux of contrast into the intrahepatic IVC and central hepatic veins. No overt airspace edema or pleural fluid. 3. Moderately advanced emphysematous lung disease, predominantly in the upper lung zones bilaterally. No acute pulmonary findings. Emphysema (ICD10-J43.9). Electronically Signed   By: Aletta Edouard M.D.   On: 02/08/2018 09:58   Dg Chest Port 1 View  Result Date: 02/08/2018 CLINICAL DATA:  Fatigue for 2 weeks.  Bilateral leg swelling. EXAM: PORTABLE CHEST 1 VIEW COMPARISON:  August 02, 2014 FINDINGS: Evaluation of heart size is limited due to portable technique. The heart  appears borderline but this may be exaggerated. No pneumothorax. No nodules or masses. No focal infiltrates. No overt edema. IMPRESSION: Evaluation the cardiac size is limited due to portable technique. No edema or gross cardiomegaly. Electronically Signed   By: Dorise Bullion III M.D   On: 02/08/2018 09:08    Procedures Procedures (including critical care time) CRITICAL CARE Performed by: Quintella Reichert   Total critical care time: 35 minutes  Critical care time was exclusive of separately billable procedures and treating other patients.  Critical care was necessary to treat or prevent imminent or life-threatening deterioration.  Critical care was time spent personally by me on the following activities: development of treatment plan with patient and/or surrogate as well as nursing, discussions with consultants, evaluation of patient's response to treatment, examination of patient, obtaining history from patient or surrogate, ordering and performing treatments and interventions, ordering and review of laboratory studies, ordering and review of radiographic studies, pulse oximetry and re-evaluation of patient's condition.  Medications Ordered in ED Medications  enoxaparin (LOVENOX) injection 40 mg (has no administration in time range)  acetaminophen (TYLENOL) tablet 650 mg (has no administration in time range)    Or  acetaminophen (TYLENOL) suppository 650 mg (has no administration in time range)  ondansetron (ZOFRAN) tablet 4 mg (has no administration in time range)    Or  ondansetron (ZOFRAN) injection 4 mg (has no administration in time range)  polyethylene glycol (MIRALAX / GLYCOLAX) packet 17 g (has no administration in time range)  furosemide (LASIX) injection 40 mg (has no administration in time range)  doxycycline (VIBRA-TABS) tablet 100 mg (100 mg Oral Given 02/08/18 1406)  methylPREDNISolone sodium succinate (SOLU-MEDROL) 40 mg/mL injection 40 mg (has no administration in time  range)  albuterol (PROVENTIL) (2.5 MG/3ML) 0.083% nebulizer solution 2.5 mg (has no administration in time range)  guaiFENesin-dextromethorphan (ROBITUSSIN DM) 100-10 MG/5ML syrup 5 mL (has no administration in time range)  MEDLINE mouth rinse (15 mLs Mouth Rinse Given 02/08/18 1406)  ipratropium-albuterol (DUONEB) 0.5-2.5 (3) MG/3ML nebulizer solution 3 mL (has no administration in time range)  ipratropium-albuterol (DUONEB) 0.5-2.5 (3) MG/3ML nebulizer solution 3 mL (3 mLs Nebulization Given 02/08/18 0903)  methylPREDNISolone sodium succinate (SOLU-MEDROL) 125 mg/2 mL injection 125 mg (125 mg Intravenous Given 02/08/18 0958)  iopamidol (ISOVUE-370) 76 % injection (100 mLs  Contrast Given 02/08/18 0939)  albuterol (PROVENTIL) (2.5 MG/3ML) 0.083% nebulizer solution 5 mg (5 mg Nebulization Given 02/08/18 1059)     Initial Impression / Assessment and Plan / ED  Course  I have reviewed the triage vital signs and the nursing notes.  Pertinent labs & imaging results that were available during my care of the patient were reviewed by me and considered in my medical decision making (see chart for details).     Patient here for evaluation of progressive fatigue, shortness of breath and leg edema. He has hypoxia on ED arrival with tachypnea. Hypoxia improved with supplemental oxygen. Initial examination with decreased air movement bilaterally. Following albuterol treatment he did have some and expiratory wheezes. Initial concern for possible PE given his asymmetric lower extremity edema and hypoxia. CT is negative for acute PE. There is evidence of acute right-sided heart failure, likely secondary to underlying lung disease. Plan to admit to the hospital service for further evaluation and treatment. Patient and family updated findings of studies and recommendation for admission for further treatment and they are in agreement with plan.  Final Clinical Impressions(s) / ED Diagnoses   Final diagnoses:  None     ED Discharge Orders    None       Quintella Reichert, MD 02/08/18 1441

## 2018-02-08 NOTE — Progress Notes (Signed)
CRITICAL VALUE ALERT  Critical Value:  Troponin 0.03  Date & Time Notied:  02/08/2018 12:53 pm  Provider Notified: Emokpae  Orders Received/Actions taken:

## 2018-02-08 NOTE — ED Notes (Signed)
ED Provider at bedside. REES

## 2018-02-08 NOTE — H&P (Addendum)
History and Physical    Zacchaeus Halm LTJ:[id number] DOB: Oct 17, 1960 DOA: 02/08/2018  PCP: Patient, No Pcp Per   Patient coming from: Home  Chief Complaint: Fatigue, SOB, leg swelling  HPI: Anthony Nelson is a 57 y.o. male with medical history significant for bronchitis diagnosed a year ago, who presented to the ED with complaints of intermittent difficulty breathing which has become persistent and worse over the past 2 to 3 weeks, with fatigue of 2 weeks duration and bilateral lower extremity swelling of one day duration, without redness or pain.  He reports a chronic cough productive of whitish thick sputum over the past 6 months, mostly unchanged.  No associated chest pain.No fever or chills no dysuria. He denies recent travel, personal or known family history of blood clots or heart attacks.  Patient smokes half a pack to pack of cigarettes daily.  Not on home O2.  ED Course: O2 sat 73% on room air, improved to >98% on 2L , mildly elevated liver enzymes AST 69 ALT 125.  I-STAT troponin negative.  Mildly elevated lactic acid 2.05. CTA- No PE, evidence of elevated right heart pressures and underlying right heart failure with dilated right-sided heart chambers and reflux of contrast into intrahepatic IVC and central hepatic veins.  No overt airspace edema or pleural fluid.  Moderately advanced emphysematous lung disease, predominantly in upper lung zones bilaterally.  Patient was given IV Solu-Medrol 125 mg and bronchodilators.  Hospitalist was called to admit for COPD exacerbation and probable right heart failure.  Review of Systems: As per HPI all other systems reviewed and negative  Past Medical History:  Diagnosis Date  . Bronchitis     Past Surgical History:  Procedure Laterality Date  . HERNIA REPAIR       reports that he has been smoking cigarettes. He has been smoking about 0.50 packs per day. He has never used smokeless tobacco. He reports that he drinks alcohol. He reports that he  does not use drugs.  Allergies  Allergen Reactions  . Erythromycin     Family History  Problem Relation Age of Onset  . Cancer Mother   . Cancer Father     Prior to Admission medications   Medication Sig Start Date End Date Taking? Authorizing Provider  ibuprofen (ADVIL,MOTRIN) 200 MG tablet Take 800 mg by mouth daily as needed for moderate pain.   Yes [provider]    Physical Exam: Vitals:   02/08/18 0810 02/08/18 0856 02/08/18 0904 02/08/18 0930  BP: (!) 122/92   103/77  Pulse:  95 95 92  Resp:  _0 Temp:      TempSrc:      SpO2:  98% 97% 94%  Weight:      Height:        Constitutional: NAD, calm, comfortable Vitals:   02/08/18 0810 02/08/18 0856 02/08/18 0904 02/08/18 0930  BP: (!) 122/92   103/77  Pulse:  95 95 92  Resp:  _1 Temp:      TempSrc:      SpO2:  98% 97% 94%  Weight:      Height:       Eyes: PERRL, lids and conjunctivae normal ENMT: Mucous membranes are moist. Posterior pharynx clear of any exudate or lesions.  Neck: normal, supple, no masses, no thyromegaly Respiratory: Diffusely reduced breath sounds with expiratory wheezing, no crackles. Normal respiratory effort. No accessory muscle use.  Cardiovascular: Regular rate and rhythm, no murmurs / rubs /  gallops. 2 + pitting pedal edema to knees, distended neck veins to ears  Abdomen: no tenderness, no masses palpated. No hepatosplenomegaly. Bowel sounds positive.  Musculoskeletal: no clubbing / cyanosis. No joint deformity upper and lower extremities. Good ROM, no contractures. Normal muscle tone.  Skin: no rashes, lesions, ulcers. No induration Neurologic: CN 2-12 grossly intact. Strength 5/5 in all 4.  Psychiatric: Normal judgment and insight. Alert and oriented x 3. Normal mood.   Labs on Admission: I have personally reviewed following labs and imaging studies  CBC: Recent Labs  Lab 02/08/18 0828 02/08/18 0839  WBC 5.7  --   NEUTROABS 4.1  --   HGB 17.8* 19.0*    HCT 52.8* 56.0*  MCV 102.9*  --   PLT 170  --    Basic Metabolic Panel: Recent Labs  Lab 02/08/18 0828 02/08/18 0839  NA 139 136  K 3.7 3.4*  CL 94* 91*  CO2 33*  --   GLUCOSE 109* 108*  BUN 23* 24*  CREATININE 0.88 0.90  CALCIUM 8.5*  --    GFR: Estimated Creatinine Clearance: 80.4 mL/min (by C-G formula based on SCr of 0.9 mg/dL). Liver Function Tests: Recent Labs  Lab 02/08/18 0828  AST 69*  ALT 135*  ALKPHOS 66  BILITOT 0.9  PROT 6.4*  ALBUMIN 3.6    Radiological Exams on Admission: Ct Angio Chest Pe W/cm &/or Wo Cm  Result Date: 02/08/2018 CLINICAL DATA:  Fatigue, shortness of breath, hypoxia and bilateral lower extremity edema. EXAM: CT ANGIOGRAPHY CHEST WITH CONTRAST TECHNIQUE: Multidetector CT imaging of the chest was performed using the standard protocol during bolus administration of intravenous contrast. Multiplanar CT image reconstructions and MIPs were obtained to evaluate the vascular anatomy. CONTRAST:  171m ISOVUE-370 IOPAMIDOL (ISOVUE-370) INJECTION 76% COMPARISON:  Chest x-ray earlier today. FINDINGS: Cardiovascular: Satisfactory opacification of the pulmonary arteries to the segmental level. No evidence of pulmonary embolism. Central pulmonary arteries are normal in caliber. Right ventricle and right atrium are dilated and there is some reflux of contrast into the intrahepatic IVC and central hepatic veins. Findings are consistent with some component of underlying right heart failure and elevated right heart pressures. The thoracic aorta is poorly opacified but shows normal caliber without evidence of aneurysmal disease. No pericardial fluid identified. No significant calcified coronary artery plaque identified. Mediastinum/Nodes: No enlarged mediastinal, hilar, or axillary lymph nodes. Thyroid gland, trachea, and esophagus demonstrate no significant findings. Lungs/Pleura: There is evidence of moderately advanced emphysematous lung disease throughout both  lungs and predominantly in the upper lung zones. Mild scarring present at both lung bases. There is no evidence of pulmonary edema, consolidation, pneumothorax, nodule or pleural fluid. Upper Abdomen: No acute abnormality. Musculoskeletal: No chest wall abnormality. No acute or significant osseous findings. Review of the MIP images confirms the above findings. IMPRESSION: 1. No evidence of pulmonary embolism. 2. Evidence of elevated right heart pressures and underlying right heart failure with dilated right-sided heart chambers and reflux of contrast into the intrahepatic IVC and central hepatic veins. No overt airspace edema or pleural fluid. 3. Moderately advanced emphysematous lung disease, predominantly in the upper lung zones bilaterally. No acute pulmonary findings. Emphysema (ICD10-J43.9). Electronically Signed   By: GAletta EdouardM.D.   On: 02/08/2018 09:58   Dg Chest Port 1 View  Result Date: 02/08/2018 CLINICAL DATA:  Fatigue for 2 weeks.  Bilateral leg swelling. EXAM: PORTABLE CHEST 1 VIEW COMPARISON:  August 02, 2014 FINDINGS: Evaluation of heart size is limited due to  portable technique. The heart appears borderline but this may be exaggerated. No pneumothorax. No nodules or masses. No focal infiltrates. No overt edema. IMPRESSION: Evaluation the cardiac size is limited due to portable technique. No edema or gross cardiomegaly. Electronically Signed   By: Dorise Bullion III M.D   On: 02/08/2018 09:08    EKG: Independently reviewed.  Sinus rhythm with artifacts.  Nonspecific T wave flattening 2 3 aVF. no Old EKG to compare.  Assessment/Plan Active Problems:   COPD exacerbation ([facility])   Leg swelling   Tobacco abuse   COPD exacerbation- SOB, hypoxia, with wheeze.  Tobacco abuse.  CTA no infiltrates to suggest right heart failure. WBC- 5.7. -IV Solu-Medrol 40 twice daily -DuoNeb scheduled albuterol as needed -Doxycycline (erythromycin allergy noted) -PRN mucolytic's, supplemental O2 -  Counselled extensively on quitting tobacco abuse  Probable right heart failure/Pulmonary HTN-  Bilat lower extremity swelling, with distended neck veins, fatigue, SOB, elevated liver enzymes. CTA-suggest right heart failure.  ? 2/2 COPD.  i-STAT troponin negative.  EKG-artifacts, nonspecific T wave flattening 2 3 aVF. - Echocardiogram-evaluate right heart pressures - BNP -for future reference- 825 - lasix 40 BID - Strict in and out  - daily weights  -Bilateral lower extremity Dopplers - CMP a.m - Trops X 3 -Repeat EKG - Addendum- Echo- Ef 60-65%, G1DD, severe pulmonary HTN, PA pressures 80. Please Consult cardiology in A.m  Elevated liver enzymes-AST 69, ALT- 135. No priors to compare. Likely related to congestive hepatopathy 2/2 right heart failure. - CMP a.m -Acute hepatitis panel  HIV as part of routine health screening  DVT prophylaxis: Lovenox Code Status: Full Family Communication: Spouse at bedside Disposition Plan: per rounding team Consults called: Consult cardiology in a.m Admission status: inpt, tele   Bethena Roys MD [facility] Pager [phone] From 7AM-7PM.  Otherwise please contact night-coverage [email or web address] Password [facility]  02/08/2018, 10:38 AM

## 2018-02-08 NOTE — ED Notes (Signed)
BLOOD CULTURE X 2 OBTAINED BY ANNIEAH MOSER NT LEFT AC

## 2018-02-08 NOTE — ED Notes (Signed)
ED TO INPATIENT HANDOFF REPORT  Name/Age/Gender Anthony Nelson 57 y.o. male  Code Status   Home/SNF/Other Home  Chief Complaint fatigue/leg swelling  Level of Care/Admitting Diagnosis ED Disposition    ED Disposition Condition [facility] Area: [place] [[id number]]  Level of Care: Telemetry [5]  Admit to tele based on following criteria: Other see comments  Comments: Dyspnea  Diagnosis: COPD exacerbation [facility]) [[id number]]  Admitting Physician: Kara Pacer  Attending Physician: Bethena Roys [phone]  Estimated length of stay: past midnight tomorrow  Certification:: I certify this patient will need inpatient services for at least 2 midnights  PT Class (Do Not Modify): Inpatient [101]  PT Acc Code (Do Not Modify): Private [1]       Medical History Past Medical History:  Diagnosis Date  . Bronchitis     Allergies Allergies  Allergen Reactions  . Erythromycin     IV Location/Drains/Wounds Patient Lines/Drains/Airways Status   Active Line/Drains/Airways    Name:   Placement date:   Placement time:   Site:   Days:   Peripheral IV 02/08/18 Right Forearm   02/08/18    0828    Forearm   less than 1          Labs/Imaging Results for orders placed or performed during the hospital encounter of 02/08/18 (from the past 48 hour(s))  Comprehensive metabolic panel     Status: Abnormal   Collection Time: 02/08/18  8:28 AM  Result Value Ref Range   Sodium 139 135 - 145 mmol/L   Potassium 3.7 3.5 - 5.1 mmol/L   Chloride 94 (L) 98 - 111 mmol/L   CO2 33 (H) 22 - 32 mmol/L   Glucose, Bld 109 (H) 70 - 99 mg/dL   BUN 23 (H) 6 - 20 mg/dL   Creatinine, Ser 0.88 0.61 - 1.24 mg/dL   Calcium 8.5 (L) 8.9 - 10.3 mg/dL   Total Protein 6.4 (L) 6.5 - 8.1 g/dL   Albumin 3.6 3.5 - 5.0 g/dL   AST 69 (H) 15 - 41 U/L   ALT 135 (H) 0 - 44 U/L   Alkaline Phosphatase 66 38 - 126 U/L   Total Bilirubin 0.9 0.3 - 1.2 mg/dL   GFR calc non  Af Amer >60 >60 mL/min   GFR calc Af Amer >60 >60 mL/min    Comment: (NOTE) The eGFR has been calculated using the CKD EPI equation. This calculation has not been validated in all clinical situations. eGFR's persistently <60 mL/min signify possible Chronic Kidney Disease.    Anion gap 12 5 - 15    Comment: Performed at [facility], [place] [address], [place], [place] [address]  CBC with Differential     Status: Abnormal   Collection Time: 02/08/18  8:28 AM  Result Value Ref Range   WBC 5.7 4.0 - 10.5 K/uL   RBC 5.13 4.22 - 5.81 MIL/uL   Hemoglobin 17.8 (H) 13.0 - 17.0 g/dL   HCT 52.8 (H) 39.0 - 52.0 %   MCV 102.9 (H) 78.0 - 100.0 fL   MCH 34.7 (H) 26.0 - 34.0 pg   MCHC 33.7 30.0 - 36.0 g/dL   RDW 14.5 11.5 - 15.5 %   Platelets 170 150 - 400 K/uL   Neutrophils Relative % 72 %   Neutro Abs 4.1 1.7 - 7.7 K/uL   Lymphocytes Relative 18 %   Lymphs Abs 1.0 0.7 - 4.0 K/uL   Monocytes  Relative 9 %   Monocytes Absolute 0.5 0.1 - 1.0 K/uL   Eosinophils Relative 1 %   Eosinophils Absolute 0.0 0.0 - 0.7 K/uL   Basophils Relative 0 %   Basophils Absolute 0.0 0.0 - 0.1 K/uL    Comment: Performed at [facility], [place] [address] [place], [place] [address]  I-stat troponin, ED     Status: None   Collection Time: 02/08/18  8:37 AM  Result Value Ref Range   Troponin i, poc 0.03 0.00 - 0.08 ng/mL   Comment 3            Comment: Due to the release kinetics of cTnI, a negative result within the first hours of the onset of symptoms does not rule out myocardial infarction with certainty. If myocardial infarction is still suspected, repeat the test at appropriate intervals.   I-Stat CG4 Lactic Acid, ED     Status: Abnormal   Collection Time: 02/08/18  8:38 AM  Result Value Ref Range   Lactic Acid, Venous 2.05 (HH) 0.5 - 1.9 mmol/L   Comment NOTIFIED PHYSICIAN   I-stat Chem 8, ED     Status: Abnormal   Collection Time: 02/08/18  8:39 AM  Result Value Ref  Range   Sodium 136 135 - 145 mmol/L   Potassium 3.4 (L) 3.5 - 5.1 mmol/L   Chloride 91 (L) 98 - 111 mmol/L   BUN 24 (H) 6 - 20 mg/dL   Creatinine, Ser 0.90 0.61 - 1.24 mg/dL   Glucose, Bld 108 (H) 70 - 99 mg/dL   Calcium, Ion 0.96 (L) 1.15 - 1.40 mmol/L   TCO2 35 (H) 22 - 32 mmol/L   Hemoglobin 19.0 (H) 13.0 - 17.0 g/dL   HCT 56.0 (H) 39.0 - 52.0 %  I-Stat CG4 Lactic Acid, ED     Status: None   Collection Time: 02/08/18 11:11 AM  Result Value Ref Range   Lactic Acid, Venous 0.79 0.5 - 1.9 mmol/L   Ct Angio Chest Pe W/cm &/or Wo Cm  Result Date: 02/08/2018 CLINICAL DATA:  Fatigue, shortness of breath, hypoxia and bilateral lower extremity edema. EXAM: CT ANGIOGRAPHY CHEST WITH CONTRAST TECHNIQUE: Multidetector CT imaging of the chest was performed using the standard protocol during bolus administration of intravenous contrast. Multiplanar CT image reconstructions and MIPs were obtained to evaluate the vascular anatomy. CONTRAST:  137m ISOVUE-370 IOPAMIDOL (ISOVUE-370) INJECTION 76% COMPARISON:  Chest x-ray earlier today. FINDINGS: Cardiovascular: Satisfactory opacification of the pulmonary arteries to the segmental level. No evidence of pulmonary embolism. Central pulmonary arteries are normal in caliber. Right ventricle and right atrium are dilated and there is some reflux of contrast into the intrahepatic IVC and central hepatic veins. Findings are consistent with some component of underlying right heart failure and elevated right heart pressures. The thoracic aorta is poorly opacified but shows normal caliber without evidence of aneurysmal disease. No pericardial fluid identified. No significant calcified coronary artery plaque identified. Mediastinum/Nodes: No enlarged mediastinal, hilar, or axillary lymph nodes. Thyroid gland, trachea, and esophagus demonstrate no significant findings. Lungs/Pleura: There is evidence of moderately advanced emphysematous lung disease throughout both lungs and  predominantly in the upper lung zones. Mild scarring present at both lung bases. There is no evidence of pulmonary edema, consolidation, pneumothorax, nodule or pleural fluid. Upper Abdomen: No acute abnormality. Musculoskeletal: No chest wall abnormality. No acute or significant osseous findings. Review of the MIP images confirms the above findings. IMPRESSION: 1. No evidence of pulmonary embolism. 2. Evidence  of elevated right heart pressures and underlying right heart failure with dilated right-sided heart chambers and reflux of contrast into the intrahepatic IVC and central hepatic veins. No overt airspace edema or pleural fluid. 3. Moderately advanced emphysematous lung disease, predominantly in the upper lung zones bilaterally. No acute pulmonary findings. Emphysema (ICD10-J43.9). Electronically Signed   By: Aletta Edouard M.D.   On: 02/08/2018 09:58   Dg Chest Port 1 View  Result Date: 02/08/2018 CLINICAL DATA:  Fatigue for 2 weeks.  Bilateral leg swelling. EXAM: PORTABLE CHEST 1 VIEW COMPARISON:  August 02, 2014 FINDINGS: Evaluation of heart size is limited due to portable technique. The heart appears borderline but this may be exaggerated. No pneumothorax. No nodules or masses. No focal infiltrates. No overt edema. IMPRESSION: Evaluation the cardiac size is limited due to portable technique. No edema or gross cardiomegaly. Electronically Signed   By: Dorise Bullion III M.D   On: 02/08/2018 09:08    Pending Labs Unresulted Labs (From admission, onward)    Start     Ordered   02/08/18 1123  Brain natriuretic peptide  Add-on,   R     02/08/18 1122   02/08/18 1041  Troponin I  Add-on,   R     02/08/18 1040   02/08/18 0812  Urinalysis, Routine w reflex microscopic  STAT,   STAT     02/08/18 0812   Signed and Held  HIV antibody (Routine Testing)  Once,   R     Signed and Held   Signed and Held  Troponin I (q 6hr x 3)  Now then every 6 hours,   R     Signed and Held   Signed and Held   Comprehensive metabolic panel  Tomorrow morning,   R     Signed and Held   Signed and Held  Hepatitis panel, acute  Once,   R     Signed and Held          Vitals/Pain Today's Vitals   02/08/18 0930 02/08/18 0957 02/08/18 1100 02/08/18 1102  BP: 103/77  104/83   Pulse: 92  88 89  Resp: 18  (!) 21 15  Temp:      TempSrc:      SpO2: 94%  90% 92%  Weight:      Height:      PainSc:  0-No pain      Isolation Precautions No active isolations  Medications Medications  ipratropium-albuterol (DUONEB) 0.5-2.5 (3) MG/3ML nebulizer solution 3 mL (3 mLs Nebulization Given 02/08/18 0903)  methylPREDNISolone sodium succinate (SOLU-MEDROL) 125 mg/2 mL injection 125 mg (125 mg Intravenous Given 02/08/18 0958)  iopamidol (ISOVUE-370) 76 % injection (100 mLs  Contrast Given 02/08/18 0939)  albuterol (PROVENTIL) (2.5 MG/3ML) 0.083% nebulizer solution 5 mg (5 mg Nebulization Given 02/08/18 1059)    Mobility walks

## 2018-02-09 ENCOUNTER — Inpatient Hospital Stay (HOSPITAL_COMMUNITY): Payer: Self-pay

## 2018-02-09 DIAGNOSIS — M7989 Other specified soft tissue disorders: Secondary | ICD-10-CM

## 2018-02-09 LAB — COMPREHENSIVE METABOLIC PANEL
ALT: 95 U/L — ABNORMAL HIGH (ref 0–44)
AST: 38 U/L (ref 15–41)
Albumin: 3 g/dL — ABNORMAL LOW (ref 3.5–5.0)
Alkaline Phosphatase: 61 U/L (ref 38–126)
Anion gap: 11 (ref 5–15)
BUN: 18 mg/dL (ref 6–20)
CO2: 37 mmol/L — ABNORMAL HIGH (ref 22–32)
Calcium: 8.2 mg/dL — ABNORMAL LOW (ref 8.9–10.3)
Chloride: 94 mmol/L — ABNORMAL LOW (ref 98–111)
Creatinine, Ser: 0.81 mg/dL (ref 0.61–1.24)
GFR calc Af Amer: >60 mL/min (ref >60)
GFR calc non Af Amer: >60 mL/min (ref >60)
Glucose, Bld: 131 mg/dL — ABNORMAL HIGH (ref 70–99)
Potassium: 4.1 mmol/L (ref 3.5–5.1)
Sodium: 142 mmol/L (ref 135–145)
Total Bilirubin: 0.5 mg/dL (ref 0.3–1.2)
Total Protein: 5.4 g/dL — ABNORMAL LOW (ref 6.5–8.1)

## 2018-02-09 LAB — HEPATITIS PANEL, ACUTE
HCV Ab: <0.1 s/co ratio (ref 0.0–0.9)
HEP B S AG: NEGATIVE
Hep A IgM: NEGATIVE
Hep B C IgM: NEGATIVE

## 2018-02-09 LAB — HIV ANTIBODY (ROUTINE TESTING W REFLEX): HIV SCREEN 4TH GENERATION: NONREACTIVE

## 2018-02-09 MED ORDER — BUDESONIDE 0.5 MG/2ML IN SUSP
0.5000 mg | Freq: Two times a day (BID) | RESPIRATORY_TRACT | Status: DC
  Administered 2018-02-09 – 2018-02-10 (×2): 0.5 mg via RESPIRATORY_TRACT
  Filled 2018-02-09 (×2): qty 2

## 2018-02-09 MED ORDER — FAMOTIDINE 20 MG PO TABS
20.0000 mg | ORAL_TABLET | Freq: Two times a day (BID) | ORAL | Status: DC
  Administered 2018-02-09 – 2018-02-10 (×2): 20 mg via ORAL
  Filled 2018-02-09 (×2): qty 1

## 2018-02-09 MED ORDER — ARFORMOTEROL TARTRATE 15 MCG/2ML IN NEBU
15.0000 ug | INHALATION_SOLUTION | Freq: Two times a day (BID) | RESPIRATORY_TRACT | Status: DC
  Administered 2018-02-09 – 2018-02-10 (×2): 15 ug via RESPIRATORY_TRACT
  Filled 2018-02-09 (×2): qty 2

## 2018-02-09 NOTE — Progress Notes (Signed)
PROGRESS NOTE [facility]   Anthony Nelson   XHB:[id number] DOB: [birth year]  DOA: 02/08/2018 PCP: Patient, No Pcp Per   Brief Narrative:  Anthony Nelson 57 year old male with no significant medical history other than bronchitis diagnosed a year ago and smoker.  Patient presented to the emergency department complaining of shortness of breath, cough, fatigue and leg swelling.  Symptoms started 2 to 3 weeks prior to admission.  However worsening over day past 2 days.  Upon ED evaluation was found to have oxygen saturation of 73% on room air, which improved to 98% on 2 L nasal cannula.  Lab work-up revealed mild elevated LFTs, mild elevated lactic acid. CTA with no evidence of PE, however there was evidence of elevated right heart pressure and possible underlying right heart failure.  Moderately advanced emphysematous lungs disease predominantly in the upper lungs bilaterally.  Patient was admitted with working diagnosis of COPD exacerbation and work-up for right heart failure.  Subjective: Patient seen and examined, reported his breathing is significantly better also his leg swelling is almost resolved.  Patient denies any chest pain, palpitations and weakness.  No acute events overnight.  And remains afebrile.  Assessment & Plan: Acute respiratory failure with hypoxia Multifactorial felt to be secondary to undiagnosed COPD and pulmonary hypertension, leading to acute diastolic CHF exacerbation.  Clinically improving, wean oxygen as able.  Treat underlying causes.  COPD exacerbation Clinically correlate with COPD CT with emphysematous changes, long-term smoker and responding to nebulizer and steroids.  Will continue IV Solu-Medrol, add Brovana and Pulmicort. Continue duo nebs, and supportive treatment.  Will add flutter valve.  Will check pulse ox with ambulation's in a.m. check procalcitonin.  Continue doxycycline for now.  Pulmonary hypertension - GP 3 Treat underlying cause, continue oxygen  for hypoxemia.  Acute diastolic CHF Echocardiogram shows severe pulmonary hypertension and grade 1 diastolic dysfunction. LVEF 60 to 65%.  PA pressure 80  Elevated BNPPatient was treated with IV Lasix and responded well.  Will place TED hose on the d/c IV Lasix for now will evaluate in a.m. to see if will need oral diuretics.  .  No further ischemic work-up at this point.  Elevated liver enzymes Felt to be related to congestive hepatopathy, LFTs trending down.  Panel negative Continue to monitor  GERD Started on famotidine.  Tobacco abuse Cessation discussed Nicotine patch  DVT prophylaxis: Lovenox Code Status: Full code Family Communication: Wife at bedside Disposition Plan: Home in 1 to 2 days  Consultants:   None  Procedures:   ECHO 02/08/18  Antimicrobials:  None   Objective: Vitals:   02/09/18 0651 02/09/18 0851 02/09/18 0941 02/09/18 1332  BP: 100/68  90/64 103/71  Pulse:  98 93 95  Resp:  _0 Temp:    98.8 F (37.1 C)  TempSrc:    Oral  SpO2:  94% 95% 94%  Weight: 58.4 kg     Height:        Intake/Output Summary (Last 24 hours) at 02/09/2018 1446 Last data filed at 02/09/2018 1330 Gross per 24 hour  Intake 600 ml  Output 1600 ml  Net -1000 ml   Filed Weights   02/08/18 0756 02/08/18 1230 02/09/18 0651  Weight: 63.5 kg 58.5 kg 58.4 kg    Examination:  General exam: NAD HEENT:  OP moist and clear Respiratory system: Decreased air entry, diffuse wheezing bilaterally, mild rhonchi bibasilar Cardiovascular system: S1 & S2 heard, RRR. No JVD, murmurs, rubs or gallops Gastrointestinal system: Abdomen  is nondistended, soft and nontender.  Central nervous system: Alert and oriented. No focal neurological deficits. Extremities: Lateral lower extremity trace edema Skin: No rashes, lesions or ulcers Psychiatry: Mood & affect appropriate.    Data Reviewed: I have personally reviewed following labs and imaging studies  CBC: Recent Labs  Lab  02/08/18 0828 02/08/18 0839  WBC 5.7  --   NEUTROABS 4.1  --   HGB 17.8* 19.0*  HCT 52.8* 56.0*  MCV 102.9*  --   PLT 170  --    Basic Metabolic Panel: Recent Labs  Lab 02/08/18 0828 02/08/18 0839 02/09/18 0431  NA 139 136 142  K 3.7 3.4* 4.1  CL 94* 91* 94*  CO2 33*  --  37*  GLUCOSE 109* 108* 131*  BUN 23* 24* 18  CREATININE 0.88 0.90 0.81  CALCIUM 8.5*  --  8.2*   GFR: Estimated Creatinine Clearance: 82.1 mL/min (by C-G formula based on SCr of 0.81 mg/dL). Liver Function Tests: Recent Labs  Lab 02/08/18 0828 02/09/18 0431  AST 69* 38  ALT 135* 95*  ALKPHOS 66 61  BILITOT 0.9 0.5  PROT 6.4* 5.4*  ALBUMIN 3.6 3.0*   No results for input(s): LIPASE, AMYLASE in the last 168 hours. No results for input(s): AMMONIA in the last 168 hours. Coagulation Profile: No results for input(s): INR, PROTIME in the last 168 hours. Cardiac Enzymes: Recent Labs  Lab 02/08/18 1153 02/08/18 1402 02/08/18 2055  TROPONINI 0.03* 0.03* <0.03   BNP (last 3 results) No results for input(s): PROBNP in the last 8760 hours. HbA1C: No results for input(s): HGBA1C in the last 72 hours. CBG: No results for input(s): GLUCAP in the last 168 hours. Lipid Profile: No results for input(s): CHOL, HDL, LDLCALC, TRIG, CHOLHDL, LDLDIRECT in the last 72 hours. Thyroid Function Tests: No results for input(s): TSH, T4TOTAL, FREET4, T3FREE, THYROIDAB in the last 72 hours. Anemia Panel: No results for input(s): VITAMINB12, FOLATE, FERRITIN, TIBC, IRON, RETICCTPCT in the last 72 hours. Sepsis Labs: Recent Labs  Lab 02/08/18 0838 02/08/18 1111  LATICACIDVEN 2.05* 0.79    No results found for this or any previous visit (from the past 240 hour(s)).    Radiology Studies: Ct Angio Chest Pe W/cm &/or Wo Cm  Result Date: 02/08/2018 CLINICAL DATA:  Fatigue, shortness of breath, hypoxia and bilateral lower extremity edema. EXAM: CT ANGIOGRAPHY CHEST WITH CONTRAST TECHNIQUE: Multidetector CT  imaging of the chest was performed using the standard protocol during bolus administration of intravenous contrast. Multiplanar CT image reconstructions and MIPs were obtained to evaluate the vascular anatomy. CONTRAST:  187m ISOVUE-370 IOPAMIDOL (ISOVUE-370) INJECTION 76% COMPARISON:  Chest x-ray earlier today. FINDINGS: Cardiovascular: Satisfactory opacification of the pulmonary arteries to the segmental level. No evidence of pulmonary embolism. Central pulmonary arteries are normal in caliber. Right ventricle and right atrium are dilated and there is some reflux of contrast into the intrahepatic IVC and central hepatic veins. Findings are consistent with some component of underlying right heart failure and elevated right heart pressures. The thoracic aorta is poorly opacified but shows normal caliber without evidence of aneurysmal disease. No pericardial fluid identified. No significant calcified coronary artery plaque identified. Mediastinum/Nodes: No enlarged mediastinal, hilar, or axillary lymph nodes. Thyroid gland, trachea, and esophagus demonstrate no significant findings. Lungs/Pleura: There is evidence of moderately advanced emphysematous lung disease throughout both lungs and predominantly in the upper lung zones. Mild scarring present at both lung bases. There is no evidence of pulmonary edema, consolidation, pneumothorax, nodule  or pleural fluid. Upper Abdomen: No acute abnormality. Musculoskeletal: No chest wall abnormality. No acute or significant osseous findings. Review of the MIP images confirms the above findings. IMPRESSION: 1. No evidence of pulmonary embolism. 2. Evidence of elevated right heart pressures and underlying right heart failure with dilated right-sided heart chambers and reflux of contrast into the intrahepatic IVC and central hepatic veins. No overt airspace edema or pleural fluid. 3. Moderately advanced emphysematous lung disease, predominantly in the upper lung zones  bilaterally. No acute pulmonary findings. Emphysema (ICD10-J43.9). Electronically Signed   By: Aletta Edouard M.D.   On: 02/08/2018 09:58   Dg Chest Port 1 View  Result Date: 02/08/2018 CLINICAL DATA:  Fatigue for 2 weeks.  Bilateral leg swelling. EXAM: PORTABLE CHEST 1 VIEW COMPARISON:  August 02, 2014 FINDINGS: Evaluation of heart size is limited due to portable technique. The heart appears borderline but this may be exaggerated. No pneumothorax. No nodules or masses. No focal infiltrates. No overt edema. IMPRESSION: Evaluation the cardiac size is limited due to portable technique. No edema or gross cardiomegaly. Electronically Signed   By: Dorise Bullion III M.D   On: 02/08/2018 09:08      Scheduled Meds: . arformoterol  15 mcg Nebulization BID  . budesonide (PULMICORT) nebulizer solution  0.5 mg Nebulization BID  . doxycycline  100 mg Oral Q12H  . enoxaparin (LOVENOX) injection  40 mg Subcutaneous Q24H  . ipratropium-albuterol  3 mL Nebulization TID  . mouth rinse  15 mL Mouth Rinse BID  . methylPREDNISolone (SOLU-MEDROL) injection  40 mg Intravenous Q12H  . nicotine  14 mg Transdermal Daily   Continuous Infusions:   LOS: 1 day    Time spent: Total of 35 minutes spent with pt, greater than 50% of which was spent in discussion of  treatment, counseling and coordination of care   Anthony Oman, MD Pager: Text Page via [email or web address]   If 7PM-7AM, please contact night-coverage [email or web address] 02/09/2018, 2:46 PM   Note - This record has been created using [employer]. Chart creation errors have been sought, but may not always have been located. Such creation errors do not reflect on the standard of medical care.

## 2018-02-09 NOTE — Progress Notes (Signed)
Pt ambulated >360 feet in hallway. O2 Sats dropped to 68% 3L/[place] when ambulating, but pt showed no signs of hypoxia. Pt was able to continue talking the whole walk and only c/o mild SOB. Pt also c/o his belly feeling distended. O2 Sats recovered to 95-96% 3L/[place] after sitting down. MD Quincy Simmonds paged to be made aware.

## 2018-02-09 NOTE — Progress Notes (Signed)
Bilateral lower extremity venous duplex completed. Preliminary results - There is no evidence of a DVT or Baker's cyst. Toma Copier, RVS  02/09/2018 8:55 AM

## 2018-02-10 ENCOUNTER — Telehealth: Payer: Self-pay

## 2018-02-10 DIAGNOSIS — Z720 Tobacco use: Secondary | ICD-10-CM

## 2018-02-10 DIAGNOSIS — I5031 Acute diastolic (congestive) heart failure: Secondary | ICD-10-CM

## 2018-02-10 DIAGNOSIS — J441 Chronic obstructive pulmonary disease with (acute) exacerbation: Principal | ICD-10-CM

## 2018-02-10 DIAGNOSIS — I272 Pulmonary hypertension, unspecified: Secondary | ICD-10-CM

## 2018-02-10 LAB — PROCALCITONIN

## 2018-02-10 MED ORDER — TIOTROPIUM BROMIDE MONOHYDRATE 18 MCG IN CAPS: 18.0000 ug | ORAL_CAPSULE | Freq: Every day | RESPIRATORY_TRACT | 12 refills | Status: DC

## 2018-02-10 MED ORDER — BISACODYL 5 MG PO TBEC: 10.0000 mg | DELAYED_RELEASE_TABLET | Freq: Once | ORAL | Status: DC

## 2018-02-10 MED ORDER — BUDESONIDE-FORMOTEROL FUMARATE 160-4.5 MCG/ACT IN AERO: 2.0000 | INHALATION_SPRAY | Freq: Two times a day (BID) | RESPIRATORY_TRACT | 0 refills | Status: DC

## 2018-02-10 MED ORDER — MOMETASONE FURO-FORMOTEROL FUM 200-5 MCG/ACT IN AERO: 2.0000 | INHALATION_SPRAY | Freq: Two times a day (BID) | RESPIRATORY_TRACT | 0 refills | Status: DC

## 2018-02-10 MED ORDER — FUROSEMIDE 20 MG PO TABS: 20.0000 mg | ORAL_TABLET | Freq: Every day | ORAL | 0 refills | Status: DC

## 2018-02-10 MED ORDER — ALBUTEROL SULFATE HFA 108 (90 BASE) MCG/ACT IN AERS: 2.0000 | INHALATION_SPRAY | Freq: Four times a day (QID) | RESPIRATORY_TRACT | 0 refills | Status: DC | PRN

## 2018-02-10 MED ORDER — FAMOTIDINE 20 MG PO TABS: 20.0000 mg | ORAL_TABLET | Freq: Two times a day (BID) | ORAL | 0 refills | Status: DC

## 2018-02-10 MED ORDER — PREDNISONE 20 MG PO TABS: 40.0000 mg | ORAL_TABLET | Freq: Every day | ORAL | 0 refills | Status: DC

## 2018-02-10 MED ORDER — NICOTINE 14 MG/24HR TD PT24: 14.0000 mg | MEDICATED_PATCH | Freq: Every day | TRANSDERMAL | 0 refills | Status: DC

## 2018-02-10 MED FILL — DULERA 200 MCG/5 MCG INH: 200-5 | 30 days supply | Qty: 13 | Fill #0

## 2018-02-10 MED FILL — VENTOLIN HFA 90 MCG INHALER: 108 (90 BAS | 25 days supply | Qty: 18 | Fill #0

## 2018-02-10 MED FILL — FUROSEMIDE 20 MG TABS: 20 | 30 days supply | Qty: 30 | Fill #0

## 2018-02-10 MED FILL — FAMOTIDINE 20 MG TABLET: 20 | 30 days supply | Qty: 60 | Fill #0

## 2018-02-10 MED FILL — predniSONE 20 MG TABS: 20 | 5 days supply | Qty: 10 | Fill #0

## 2018-02-10 NOTE — Progress Notes (Signed)
Patient discharged via [facility] with no complaints a the current time. Patient given d/c instructions, scripts and home O2.

## 2018-02-10 NOTE — Care Management Note (Signed)
Case Management Note  Patient Details  Name: Francisco Ostrovsky MRN: [id number] Date of Birth: 05-24-1961  Subjective/Objective: Admitted w/COPD. From home. No health insurance, or pcp. Provided w/pcp listing-encouraged [facility], [employer], [employer] med list. Qualify for home 02, await home 02 order-[facility] dme rep Jermaine aware of d/c today to deliver travel home 02 tank to rm prior d/c. 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 Patient stated he will f/u w/[facility] for [facility] later this week.No further CM needs.                   Action/Plan:d/c home w/home 02.   Expected Discharge Date:  02/10/18               Expected Discharge Plan:  Home/Self Care  In-House Referral:     Discharge planning Services  CM Consult, [facility], Medication Assistance  Post Acute Care Choice:    Choice offered to:     DME Arranged:  [facility] DME Agency:  [place]:    [facility] Agency:     Status of Service:  Completed, signed off  If discussed at [place] of Stay Meetings, dates discussed:    Additional Comments:  Dessa Phi, RN 02/10/2018, 11:26 AM

## 2018-02-10 NOTE — Care Management Note (Signed)
Case Management Note  Patient Details  Name: Anthony Nelson MRN: [id number] Date of Birth: [birth year]  Subjective/Objective: Will utilize [facility] program for albuterol inhaler, & Dulera both under alloted cost. Patient can afford co pay $3/per script. Informed patient of MATCH program-1x use/12 calendar months/$3 co pay/each script/select pharmacies. Patient will use [facility] otpt pharmacy. Provided patient w/MATCH program form to take to the pharmacy.No further CM needs.                   Action/Plan:d/c home.   Expected Discharge Date:  02/10/18               Expected Discharge Plan:  Home/Self Care  In-House Referral:     Discharge planning Services  CM Consult, [facility], [facility] Program  Post Acute Care Choice:    Choice offered to:     DME Arranged:  [facility] DME Agency:  [place]:    [facility] Agency:     Status of Service:  Completed, signed off  If discussed at [place] of Stay Meetings, dates discussed:    Additional Comments:  Dessa Phi, RN 02/10/2018, 1:31 PM

## 2018-02-10 NOTE — Discharge Summary (Signed)
Physician Discharge Summary  Anthony Nelson  HUT:[id number]  DOB: 08/01/1960  DOA: 02/08/2018 PCP: Patient, No Pcp Per  Admit date: 02/08/2018 Discharge date: 02/10/2018  Admitted From: Home  Disposition: Home  Recommendations for Outpatient Follow-up:  1. Follow up with PCP in 1 week 2. Pulmonology follow in 3-4 weeks for PFT  3. Will need cardiology referral   Equipment/Devices: O2 2L [place]   Discharge Condition: Stable  CODE STATUS: Full Code  Diet recommendation: Heart Healthy   Brief/Interim Summary: For full details see H&P/Progress note, but in brief, Anthony Nelson is a 57 year old male with no significant medical history other than bronchitis diagnosed a year ago and smoker.  Patient presented to the emergency department complaining of shortness of breath, cough, fatigue and leg swelling.  Symptoms started 2 to 3 weeks prior to admission.  However worsening over day past 2 days.  Upon ED evaluation was found to have oxygen saturation of 73% on room air, which improved to 98% on 2 L nasal cannula.  Lab work-up revealed mild elevated LFTs, mild elevated lactic acid. CTA with no evidence of PE, however there was evidence of elevated right heart pressure and possible underlying right heart failure.  Moderately advanced emphysematous lungs disease predominantly in the upper lungs bilaterally.  Patient was admitted with working diagnosis of COPD exacerbation and work-up for right heart failure.  Subjective: Patient seen and examined, he is doing well, denies SOB, palpitations and chest pain. No acute events overnight. Desated with ambulation while on room air.   Discharge Diagnoses/Hospital Course:  Acute respiratory failure with hypoxia Multifactorial felt to be secondary to undiagnosed COPD and pulmonary hypertension, leading to acute diastolic CHF exacerbation.  Clinically improved, unable to wean O2 as expected.   COPD exacerbation Clinically correlate with COPD CT with emphysematous  changes, long-term smoker and responding to nebulizer and steroids.  Patient was treated with IV Solu-Medrol, Brovana and Pulmicort.  Will transition to prednisone for 5 more days.  Will start on Symbicort, Spiriva and albuterol as needed.  Procalcitonin was negative okay to discontinue doxycycline.  Patient will need to follow-up with pulmonologist for official PFTs. Will need to continue with oxygen supplementation for now to help with pulmonary hypertension.    Pulmonary hypertension - GP 3 Treat underlying cause, continue oxygen for hypoxemia. Cardiology follow-up as an outpatient.  Acute diastolic CHF Echocardiogram shows severe pulmonary hypertension and grade 1 diastolic dysfunction. LVEF 60 to 65%.  PA pressure 80  Elevated BNPPatient was treated with IV Lasix and responded well.  Place it on TED hose, he has mild trace edema.  Patient will be discharged on Lasix 20 mg daily.  No further ischemic work-up at this point.  Follow-up with PCP to evaluate renal function and need of continued diuresis.   Elevated liver enzymes Felt to be related to congestive hepatopathy, LFTs trending down. Hepatitis panel negative Monitor CMP in 1 week.   GERD Continue Famotidine   Tobacco abuse Long discussion about cessation. Patient   On the day of the discharge the patient's vitals were stable, and no other acute medical condition were reported by patient. the patient was felt safe to be discharge to home.   Discharge Instructions  You were cared for by a hospitalist during your hospital stay. If you have any questions about your discharge medications or the care you received while you were in the hospital after you are discharged, you can call the unit and asked to speak with the hospitalist on call  if the hospitalist that took care of you is not available. Once you are discharged, your primary care physician will handle any further medical issues. Please note that NO REFILLS for any discharge  medications will be authorized once you are discharged, as it is imperative that you return to your primary care physician (or establish a relationship with a primary care physician if you do not have one) for your aftercare needs so that they can reassess your need for medications and monitor your lab values.  Discharge Instructions    Call MD for:  difficulty breathing, headache or visual disturbances   Complete by:  As directed    Call MD for:  extreme fatigue   Complete by:  As directed    Call MD for:  hives   Complete by:  As directed    Call MD for:  persistant dizziness or light-headedness   Complete by:  As directed    Call MD for:  persistant nausea and vomiting   Complete by:  As directed    Call MD for:  redness, tenderness, or signs of infection (pain, swelling, redness, odor or green/yellow discharge around incision site)   Complete by:  As directed    Call MD for:  severe uncontrolled pain   Complete by:  As directed    Call MD for:  temperature >100.4   Complete by:  As directed    Diet - low sodium heart healthy   Complete by:  As directed    Increase activity slowly   Complete by:  As directed      Allergies as of 02/10/2018      Reactions   Erythromycin       Medication List    STOP taking these medications   ibuprofen 200 MG tablet Commonly known as:  ADVIL,MOTRIN     TAKE these medications   albuterol 108 (90 Base) MCG/ACT inhaler Commonly known as:  PROVENTIL HFA;VENTOLIN HFA Inhale 2 puffs into the lungs every 6 (six) hours as needed for wheezing or shortness of breath.   budesonide-formoterol 160-4.5 MCG/ACT inhaler Commonly known as:  SYMBICORT Inhale 2 puffs into the lungs 2 (two) times daily.   famotidine 20 MG tablet Commonly known as:  PEPCID Take 1 tablet (20 mg total) by mouth 2 (two) times daily.   furosemide 20 MG tablet Commonly known as:  LASIX Take 1 tablet (20 mg total) by mouth daily.   nicotine 14 mg/24hr patch Commonly  known as:  NICODERM CQ - dosed in mg/24 hours Place 1 patch (14 mg total) onto the skin daily. Start taking on:  02/11/2018   predniSONE 20 MG tablet Commonly known as:  DELTASONE Take 2 tablets (40 mg total) by mouth daily with breakfast.   tiotropium 18 MCG inhalation capsule Commonly known as:  SPIRIVA Place 1 capsule (18 mcg total) into inhaler and inhale daily.            Durable Medical Equipment  (From admission, onward)         Start     Ordered   02/10/18 1227  DME Oxygen  Once    Question Answer Comment  Mode or (Route) Nasal cannula   Liters per Minute 2   Frequency Continuous (stationary and portable oxygen unit needed)   Oxygen delivery system Gas      02/10/18 1227         Follow-up Information    [place]. Go on 02/18/2018.  Why:  at 1:30pm for a hospital follow up appointment with Dr Chapman Fitch.  Contact information: [place] [phone] [phone]         Allergies  Allergen Reactions  . Erythromycin     Consultations:  None    Procedures/Studies: Ct Angio Chest Pe W/cm &/or Wo Cm  Result Date: 02/08/2018 CLINICAL DATA:  Fatigue, shortness of breath, hypoxia and bilateral lower extremity edema. EXAM: CT ANGIOGRAPHY CHEST WITH CONTRAST TECHNIQUE: Multidetector CT imaging of the chest was performed using the standard protocol during bolus administration of intravenous contrast. Multiplanar CT image reconstructions and MIPs were obtained to evaluate the vascular anatomy. CONTRAST:  194m ISOVUE-370 IOPAMIDOL (ISOVUE-370) INJECTION 76% COMPARISON:  Chest x-ray earlier today. FINDINGS: Cardiovascular: Satisfactory opacification of the pulmonary arteries to the segmental level. No evidence of pulmonary embolism. Central pulmonary arteries are normal in caliber. Right ventricle and right atrium are dilated and there is some reflux of contrast into the intrahepatic IVC and central  hepatic veins. Findings are consistent with some component of underlying right heart failure and elevated right heart pressures. The thoracic aorta is poorly opacified but shows normal caliber without evidence of aneurysmal disease. No pericardial fluid identified. No significant calcified coronary artery plaque identified. Mediastinum/Nodes: No enlarged mediastinal, hilar, or axillary lymph nodes. Thyroid gland, trachea, and esophagus demonstrate no significant findings. Lungs/Pleura: There is evidence of moderately advanced emphysematous lung disease throughout both lungs and predominantly in the upper lung zones. Mild scarring present at both lung bases. There is no evidence of pulmonary edema, consolidation, pneumothorax, nodule or pleural fluid. Upper Abdomen: No acute abnormality. Musculoskeletal: No chest wall abnormality. No acute or significant osseous findings. Review of the MIP images confirms the above findings. IMPRESSION: 1. No evidence of pulmonary embolism. 2. Evidence of elevated right heart pressures and underlying right heart failure with dilated right-sided heart chambers and reflux of contrast into the intrahepatic IVC and central hepatic veins. No overt airspace edema or pleural fluid. 3. Moderately advanced emphysematous lung disease, predominantly in the upper lung zones bilaterally. No acute pulmonary findings. Emphysema (ICD10-J43.9). Electronically Signed   By: GAletta EdouardM.D.   On: 02/08/2018 09:58   Dg Chest Port 1 View  Result Date: 02/08/2018 CLINICAL DATA:  Fatigue for 2 weeks.  Bilateral leg swelling. EXAM: PORTABLE CHEST 1 VIEW COMPARISON:  August 02, 2014 FINDINGS: Evaluation of heart size is limited due to portable technique. The heart appears borderline but this may be exaggerated. No pneumothorax. No nodules or masses. No focal infiltrates. No overt edema. IMPRESSION: Evaluation the cardiac size is limited due to portable technique. No edema or gross cardiomegaly.  Electronically Signed   By: DDorise BullionIII M.D   On: 02/08/2018 09:08    Discharge Exam: Vitals:   02/10/18 0730 02/10/18 1100  BP:    Pulse:  (!) 54  Resp:    Temp:    SpO2: 93% (!) 75%   Vitals:   02/10/18 0620 02/10/18 0654 02/10/18 0730 02/10/18 1100  BP: 98/67     Pulse: 89   (!) 54  Resp: 18     Temp: 97.8 F (36.6 C)     TempSrc: Oral     SpO2: 94%  93% (!) 75%  Weight:  59.6 kg    Height:        General: NAD, 2L [place]  Cardiovascular: RRR, S1/S2 +, no rubs, no gallops Respiratory: Good air entry, No wheezing or rales  Abdominal: Soft,  NT, ND, bowel sounds + Extremities: B/L LE trace edema   The results of significant diagnostics from this hospitalization (including imaging, microbiology, ancillary and laboratory) are listed below for reference.     Microbiology: No results found for this or any previous visit (from the past 240 hour(s)).   Labs: BNP (last 3 results) Recent Labs    02/08/18 0900  BNP 254.2*   Basic Metabolic Panel: Recent Labs  Lab 02/08/18 0828 02/08/18 0839 02/09/18 0431  NA 139 136 142  K 3.7 3.4* 4.1  CL 94* 91* 94*  CO2 33*  --  37*  GLUCOSE 109* 108* 131*  BUN 23* 24* 18  CREATININE 0.88 0.90 0.81  CALCIUM 8.5*  --  8.2*   Liver Function Tests: Recent Labs  Lab 02/08/18 0828 02/09/18 0431  AST 69* 38  ALT 135* 95*  ALKPHOS 66 61  BILITOT 0.9 0.5  PROT 6.4* 5.4*  ALBUMIN 3.6 3.0*   No results for input(s): LIPASE, AMYLASE in the last 168 hours. No results for input(s): AMMONIA in the last 168 hours. CBC: Recent Labs  Lab 02/08/18 0828 02/08/18 0839  WBC 5.7  --   NEUTROABS 4.1  --   HGB 17.8* 19.0*  HCT 52.8* 56.0*  MCV 102.9*  --   PLT 170  --    Cardiac Enzymes: Recent Labs  Lab 02/08/18 1153 02/08/18 1402 02/08/18 2055  TROPONINI 0.03* 0.03* <0.03   BNP: Invalid input(s): POCBNP CBG: No results for input(s): GLUCAP in the last 168 hours. D-Dimer No results for input(s): DDIMER in the  last 72 hours. Hgb A1c No results for input(s): HGBA1C in the last 72 hours. Lipid Profile No results for input(s): CHOL, HDL, LDLCALC, TRIG, CHOLHDL, LDLDIRECT in the last 72 hours. Thyroid function studies No results for input(s): TSH, T4TOTAL, T3FREE, THYROIDAB in the last 72 hours.  Invalid input(s): FREET3 Anemia work up No results for input(s): VITAMINB12, FOLATE, FERRITIN, TIBC, IRON, RETICCTPCT in the last 72 hours. Urinalysis    Component Value Date/Time   COLORURINE YELLOW 02/08/2018 1153   APPEARANCEUR CLEAR 02/08/2018 1153   LABSPEC >1.046 (H) 02/08/2018 1153   PHURINE 6.0 02/08/2018 1153   GLUCOSEU NEGATIVE 02/08/2018 1153   HGBUR NEGATIVE 02/08/2018 1153   BILIRUBINUR NEGATIVE 02/08/2018 1153   KETONESUR NEGATIVE 02/08/2018 1153   PROTEINUR 30 (A) 02/08/2018 1153   NITRITE NEGATIVE 02/08/2018 1153   LEUKOCYTESUR NEGATIVE 02/08/2018 1153   Sepsis Labs Invalid input(s): PROCALCITONIN,  WBC,  LACTICIDVEN Microbiology No results found for this or any previous visit (from the past 240 hour(s)).  Time coordinating discharge: 35 minutes  SIGNED:  Chipper Oman, MD  [facility] 02/10/2018, 12:27 PM  Pager please text page via  [email or web address]  Note - This record has been created using [employer]. Chart creation errors have been sought, but may not always have been located. Such creation errors do not reflect on the standard of medical care.

## 2018-02-10 NOTE — Telephone Encounter (Signed)
Message received from Dessa Phi, RN CM requesting a hospital follow up appointment for the patient at [facility].  An appointment was scheduled for 02/18/18 @ 1330 and the information was placed on the AVS. Update provided to K. Mahabir, RN CM

## 2018-02-10 NOTE — Progress Notes (Signed)
SATURATION QUALIFICATIONS: (This note is used to comply with regulatory documentation for home oxygen)  Patient Saturations on Room Air at Rest = 90  Patient Saturations on Room Air while Ambulating  =74  Patient Saturations on 2 Liters of oxygen while Ambulating = 92  Please briefly explain why patient needs home oxygen: desats while ambulating

## 2018-02-18 ENCOUNTER — Other Ambulatory Visit: Payer: Self-pay | Admitting: Pharmacist

## 2018-02-18 ENCOUNTER — Ambulatory Visit: Payer: Self-pay | Attending: Family Medicine | Admitting: Family Medicine

## 2018-02-18 ENCOUNTER — Encounter: Payer: Self-pay | Admitting: Family Medicine

## 2018-02-18 VITALS — BP 103/69 | HR 88 | Temp 97.7°F | Resp 18 | Ht 72.0 in | Wt 129.0 lb

## 2018-02-18 DIAGNOSIS — M7989 Other specified soft tissue disorders: Secondary | ICD-10-CM | POA: Insufficient documentation

## 2018-02-18 DIAGNOSIS — I272 Pulmonary hypertension, unspecified: Secondary | ICD-10-CM

## 2018-02-18 DIAGNOSIS — Z881 Allergy status to other antibiotic agents status: Secondary | ICD-10-CM | POA: Insufficient documentation

## 2018-02-18 DIAGNOSIS — J441 Chronic obstructive pulmonary disease with (acute) exacerbation: Secondary | ICD-10-CM

## 2018-02-18 DIAGNOSIS — K219 Gastro-esophageal reflux disease without esophagitis: Secondary | ICD-10-CM | POA: Insufficient documentation

## 2018-02-18 DIAGNOSIS — F17210 Nicotine dependence, cigarettes, uncomplicated: Secondary | ICD-10-CM | POA: Insufficient documentation

## 2018-02-18 DIAGNOSIS — R945 Abnormal results of liver function studies: Secondary | ICD-10-CM

## 2018-02-18 DIAGNOSIS — I5031 Acute diastolic (congestive) heart failure: Secondary | ICD-10-CM

## 2018-02-18 DIAGNOSIS — Z9981 Dependence on supplemental oxygen: Secondary | ICD-10-CM | POA: Insufficient documentation

## 2018-02-18 DIAGNOSIS — Z79899 Other long term (current) drug therapy: Secondary | ICD-10-CM | POA: Insufficient documentation

## 2018-02-18 DIAGNOSIS — J9601 Acute respiratory failure with hypoxia: Secondary | ICD-10-CM

## 2018-02-18 DIAGNOSIS — Z7951 Long term (current) use of inhaled steroids: Secondary | ICD-10-CM | POA: Insufficient documentation

## 2018-02-18 DIAGNOSIS — Z23 Encounter for immunization: Secondary | ICD-10-CM

## 2018-02-18 DIAGNOSIS — R7989 Other specified abnormal findings of blood chemistry: Secondary | ICD-10-CM

## 2018-02-18 MED ORDER — PREDNISONE 20 MG PO TABS: ORAL_TABLET | ORAL | 1 refills | Status: DC

## 2018-02-18 MED ORDER — TIOTROPIUM BROMIDE MONOHYDRATE 18 MCG IN CAPS: 18.0000 ug | ORAL_CAPSULE | Freq: Every day | RESPIRATORY_TRACT | 11 refills | Status: DC

## 2018-02-18 MED ORDER — FUROSEMIDE 20 MG PO TABS: 40.0000 mg | ORAL_TABLET | Freq: Every day | ORAL | 4 refills | Status: DC

## 2018-02-18 MED ORDER — UMECLIDINIUM BROMIDE 62.5 MCG/INH IN AEPB: 1.0000 | INHALATION_SPRAY | Freq: Every day | RESPIRATORY_TRACT | 11 refills | Status: DC

## 2018-02-18 MED ORDER — BUDESONIDE-FORMOTEROL FUMARATE 160-4.5 MCG/ACT IN AERO: 2.0000 | INHALATION_SPRAY | Freq: Two times a day (BID) | RESPIRATORY_TRACT | 11 refills | Status: DC

## 2018-02-18 MED FILL — !INCRUSE ELLIPTA 62.5 MCG I: 62.5 | 30 days supply | Qty: 30 | Fill #0

## 2018-02-18 MED FILL — predniSONE 20 MG TABS: 20 | 5 days supply | Qty: 10 | Fill #0

## 2018-02-18 NOTE — Progress Notes (Signed)
Subjective:    Patient ID: Anthony Nelson, male    DOB: 03/02/1961, 57 y.o.   MRN: [id number]  HPI       57 yo male seen to establish care after a recent hospitalization for shortness of breath and lower extremity swelling and was diagnosed with COPD, acute respiratory failure with hypoxia, pulmonary hypertension, acute diastolic CHF, elevated LFT's GERD and tobacco abuse.  Patient was hospitalized from 02/08/2018 through 02/10/2018.  Patient will need to have follow-up with pulmonology and cardiology.  Patient is accompanied by his wife at today's visit.  Patient states that he does not want to become dependent on oxygen therefore he does not wear his oxygen all the time.  Patient states that when he is sitting still at home, he does not wear his oxygen.  Patient states that he also continues to go out onto his front porch and smoke and he does not wear oxygen at this time.  Patient states that he did not receive the prescribed Spiriva at the time of hospital discharge as the social worker told him that this medication was too expensive for him.  Patient does have the Symbicort.  Patient however has only been using Symbicort once per day and not twice daily.  Patient states that when he uses it once a day he does note that he feels a little better but that by the time he wakes up in the morning he has shortness of breath.  Patient cannot lie down secondary to shortness of breath and has to prop himself up.  Patient has been sleeping in his recliner since his hospitalization.  Both patient and wife have noticed that patient has had a recent increase in edema/swelling in his lower legs.  Patient is taking Lasix 20 mg daily as prescribed at hospital discharge but has not taken his Lasix so for today due to his appointment.      Patient was asked what he understood regarding his recent hospitalization and discharge.  Patient states that he believes he was told that he has bronchitis.  Patient states that he does not  want to become dependent on oxygen because he would like to return to work as he currently works as a Presenter, broadcasting.  Patient however admits that with his shortness of breath, fatigue and peripheral edema that he likely cannot return to work for some time.  Patient denies any headaches, dizziness or muscle cramping at today's visit.  Patient states that prior to hospital admission he was more short of breath and did have dizziness/lightheadedness with changes in position.  Patient reports that prior to hospitalization he had developed a chronic cough.  Patient's wife states that the cough has pretty much gone away status post hospitalization.  Patient does have some sensation of chest congestion, and increasing shortness of breath at today's visit.  Patient denies fever or chills.  Past Medical History:  Diagnosis Date  . Bronchitis    Family History  Problem Relation Age of Onset  . Cancer Mother   . Cancer Father    Social History   Tobacco Use  . Smoking status: Current Every Day Smoker    Packs/day: 0.25    Types: Cigarettes  . Smokeless tobacco: Never Used  Substance Use Topics  . Alcohol use: Yes    Comment: Socially   . Drug use: No   Past Surgical History:  Procedure Laterality Date  . HERNIA REPAIR     Allergies  Allergen Reactions  . Erythromycin  Review of Systems     Objective:   Physical Exam  BP 103/69 (BP Location: Left Arm, Patient Position: Sitting, Cuff Size: Small)   Pulse 88   Temp 97.7 F (36.5 C) (Oral)   Resp 18   Ht 6' (1.829 m)   Wt 129 lb (58.5 kg)   SpO2 (!) 89% Comment: RA  BMI 17.50 kg/m   General-well-nourished, well-developed older male in no acute distress sitting on the exam table.  Patient is not wearing oxygen.  Patient is accompanied by his wife. ENT-TMs gray, nares with mild edema, mild posterior pharynx erythema. Neck-supple, no lymphadenopathy, no JVD Cardiovascular- regular rate and rhythm patient does appear to have a  displaced PMI Lungs- patient with decreased air movement and mild expiratory wheeze heard best in the upper lung fields.  Patient with decreased breath sounds in the lower lung fields, no rales Abdomen-soft, nontender Back-no CVA tenderness Extremities- patient with bilateral 2+ pitting edema to the midshin and 1+ edema from midshin to the knees      Assessment & Plan:  1. COPD exacerbation ([place]) Importance of complete tobacco cessation was discussed with the patient.  Patient is being referred to pulmonology for further follow-up.  Patient did have some continued wheezing on today's exam therefore patient was given additional short taper of prednisone.  Patient will have CMP and follow-up of COPD and medication use.  Patient also given new prescription for Spiriva HandiHaler which he was to have received at hospital discharge.  Patient also given new prescription for Symbicort with additional refills.  Patient is to continue his oxygen by nasal cannula - Comprehensive metabolic panel - predniSONE (DELTASONE) 20 MG tablet; 2 pills (51m) once per day for 5 days for COPD exacerbation/wheezing  Dispense: 10 tablet; Refill: 1 - tiotropium (SPIRIVA HANDIHALER) 18 MCG inhalation capsule; Place 1 capsule (18 mcg total) into inhaler and inhale daily.  Dispense: 30 capsule; Refill: 11 - budesonide-formoterol (SYMBICORT) 160-4.5 MCG/ACT inhaler; Inhale 2 puffs into the lungs 2 (two) times daily.  Dispense: 1 Inhaler; Refill: 11 - Ambulatory referral to Pulmonology  2. Acute respiratory failure with hypoxia ([facility] Discussed with patient the importance of smoking cessation, taking his respiratory medications daily as prescribed and using his oxygen continuously as prescribed.  Patient is being referred to cardiology and pulmonology for further evaluation and treatment - Ambulatory referral to Cardiology - Ambulatory referral to Pulmonology  3. Acute diastolic heart failure ([facility] Patient with recent  diagnosis of acute diastolic heart failure.  Patient does have increased peripheral edema at today's visit.  Patient will have BNP and CMP and follow-up as for his heart failure medication use.  Patient has not taken his Lasix today and was advised to start 40 mg daily beginning tonight when he gets home.  Patient is also to keep his feet elevated when possible throughout the day.  Patient is to keep his follow-up with cardiology - Brain natriuretic peptide - Comprehensive metabolic panel - Ambulatory referral to Cardiology - furosemide (LASIX) 20 MG tablet; Take 2 tablets (40 mg total) by mouth daily.  Dispense: 60 tablet; Refill: 4  4. Pulmonary hypertension ([facility] Patient with pulmonary hypertension and CHF.  Patient is to keep follow-up with cardiology and pulmonology as well as remain compliant with medications - Ambulatory referral to Cardiology  5. Elevated LFTs Patient had elevated LFTs during his recent hospitalization.  Will check LFTs to make sure that these have continued to trend downward towards normal.  See hospital discharge notes. -  Comprehensive metabolic panel  6. Need for immunization against influenza Patient was offered influenza immunization which she agreed to have at today's visit.  Patient also received informational handout regarding influenza immunization - Flu Vaccine QUAD 36+ mos IM  An After Visit Summary was printed and given to the patient. Allergies as of 02/18/2018      Reactions   Erythromycin       Medication List        Accurate as of 02/18/18 11:59 PM. Always use your most recent med list.          albuterol 108 (90 Base) MCG/ACT inhaler Commonly known as:  PROVENTIL HFA;VENTOLIN HFA Inhale 2 puffs into the lungs every 6 (six) hours as needed for wheezing or shortness of breath.   budesonide-formoterol 160-4.5 MCG/ACT inhaler Commonly known as:  SYMBICORT Inhale 2 puffs into the lungs 2 (two) times daily.   famotidine 20 MG tablet Commonly  known as:  PEPCID Take 1 tablet (20 mg total) by mouth 2 (two) times daily.   furosemide 20 MG tablet Commonly known as:  LASIX Take 2 tablets (40 mg total) by mouth daily.   mometasone-formoterol 200-5 MCG/ACT Aero Commonly known as:  DULERA Inhale 2 puffs into the lungs 2 (two) times daily.   nicotine 14 mg/24hr patch Commonly known as:  NICODERM CQ - dosed in mg/24 hours Place 1 patch (14 mg total) onto the skin daily.   predniSONE 20 MG tablet Commonly known as:  DELTASONE 2 pills (68m) once per day for 5 days for COPD exacerbation/wheezing   tiotropium 18 MCG inhalation capsule Commonly known as:  SPIRIVA Place 1 capsule (18 mcg total) into inhaler and inhale daily.   umeclidinium bromide 62.5 MCG/INH Aepb Commonly known as:  INCRUSE ELLIPTA Inhale 1 puff into the lungs daily.      Return in about 2 weeks (around 03/04/2018) for 1 week lab; 2-3 with PCP-office visit.

## 2018-02-18 NOTE — Patient Instructions (Signed)
Chronic Obstructive Pulmonary Disease Chronic obstructive pulmonary disease (COPD) is a long-term (chronic) lung problem. When you have COPD, it is hard for air to get in and out of your lungs. The way your lungs work will never return to normal. Usually the condition gets worse over time. There are things you can do to keep yourself as healthy as possible. Your doctor may treat your condition with:  Medicines.  Quitting smoking, if you smoke.  Rehabilitation. This may involve a team of specialists.  Oxygen.  Exercise and changes to your diet.  Lung surgery.  Comfort measures (palliative care).  Follow these instructions at home: Medicines  Take over-the-counter and prescription medicines only as told by your doctor.  Talk to your doctor before taking any cough or allergy medicines. You may need to avoid medicines that cause your lungs to be dry. Lifestyle  If you smoke, stop. Smoking makes the problem worse. If you need help quitting, ask your doctor.  Avoid being around things that make your breathing worse. This may include smoke, chemicals, and fumes.  Stay active, but remember to also rest.  Learn and use tips on how to relax.  Make sure you get enough sleep. Most adults need at least 7 hours a night.  Eat healthy foods. Eat smaller meals more often. Rest before meals. Controlled breathing  Learn and use tips on how to control your breathing as told by your doctor. Try: ? Breathing in (inhaling) through your nose for 1 second. Then, pucker your lips and breath out (exhale) through your lips for 2 seconds. ? Putting one hand on your belly (abdomen). Breathe in slowly through your nose for 1 second. Your hand on your belly should move out. Pucker your lips and breathe out slowly through your lips. Your hand on your belly should move in as you breathe out. Controlled coughing  Learn and use controlled coughing to clear mucus from your lungs. The steps are: 1. Lean your  head a little forward. 2. Breathe in deeply. 3. Try to hold your breath for 3 seconds. 4. Keep your mouth slightly open while coughing 2 times. 5. Spit any mucus out into a tissue. 6. Rest and do the steps again 1 or 2 times as needed. General instructions  Make sure you get all the shots (vaccines) that your doctor recommends. Ask your doctor about a flu shot and a pneumonia shot.  Use oxygen therapy and therapy to help improve your lungs (pulmonary rehabilitation) if told by your doctor. If you need home oxygen therapy, ask your doctor if you should buy a tool to measure your oxygen level (oximeter).  Make a COPD action plan with your doctor. This helps you know what to do if you feel worse than usual.  Manage any other conditions you have as told by your doctor.  Avoid going outside when it is very hot, cold, or humid.  Avoid people who have a sickness you can catch (contagious).  Keep all follow-up visits as told by your doctor. This is important. Contact a doctor if:  You cough up more mucus than usual.  There is a change in the color or thickness of the mucus.  It is harder to breathe than usual.  Your breathing is faster than usual.  You have trouble sleeping.  You need to use your medicines more often than usual.  You have trouble doing your normal activities such as getting dressed or walking around the house. Get help right away if:    You have shortness of breath while resting.  You have shortness of breath that stops you from: ? Being able to talk. ? Doing normal activities.  Your chest hurts for longer than 5 minutes.  Your skin color is more blue than usual.  Your pulse oximeter shows that you have low oxygen for longer than 5 minutes.  You have a fever.  You feel too tired to breathe normally. Summary  Chronic obstructive pulmonary disease (COPD) is a long-term lung problem.  The way your lungs work will never return to normal. Usually the  condition gets worse over time. There are things you can do to keep yourself as healthy as possible.  Take over-the-counter and prescription medicines only as told by your doctor.  If you smoke, stop. Smoking makes the problem worse. This information is not intended to replace advice given to you by your health care provider. Make sure you discuss any questions you have with your health care provider. Document Released: 12/05/2007 Document Revised: 11/24/2015 Document Reviewed: 02/12/2013 Elsevier Interactive Patient Education  2017 [place].  Heart Failure Heart failure means your heart has trouble pumping blood. This makes it hard for your body to work well. Heart failure is usually a long-term (chronic) condition. You must take good care of yourself and follow your doctor's treatment plan. Follow these instructions at home:  Take your heart medicine as told by your doctor. ? Do not stop taking medicine unless your doctor tells you to. ? Do not skip any dose of medicine. ? Refill your medicines before they run out. ? Take other medicines only as told by your doctor or pharmacist.  Stay active if told by your doctor. The elderly and people with severe heart failure should talk with a doctor about physical activity.  Eat heart-healthy foods. Choose foods that are without trans fat and are low in saturated fat, cholesterol, and salt (sodium). This includes fresh or frozen fruits and vegetables, fish, lean meats, fat-free or low-fat dairy foods, whole grains, and high-fiber foods. Lentils and dried peas and beans (legumes) are also good choices.  Limit salt if told by your doctor.  Cook in a healthy way. Roast, grill, broil, bake, poach, steam, or stir-fry foods.  Limit fluids as told by your doctor.  Weigh yourself every morning. Do this after you pee (urinate) and before you eat breakfast. Write down your weight to give to your doctor.  Take your blood pressure and write it down  if your doctor tells you to.  Ask your doctor how to check your pulse. Check your pulse as told.  Lose weight if told by your doctor.  Stop smoking or chewing tobacco. Do not use gum or patches that help you quit without your doctor's approval.  Schedule and go to doctor visits as told.  Nonpregnant women should have no more than 1 drink a day. Men should have no more than 2 drinks a day. Talk to your doctor about drinking alcohol.  Stop illegal drug use.  Stay current with shots (immunizations).  Manage your health conditions as told by your doctor.  Learn to manage your stress.  Rest when you are tired.  If it is really hot outside: ? Avoid intense activities. ? Use air conditioning or fans, or get in a cooler place. ? Avoid caffeine and alcohol. ? Wear loose-fitting, lightweight, and light-colored clothing.  If it is really cold outside: ? Avoid intense activities. ? Layer your clothing. ? Wear mittens or gloves, a  hat, and a scarf when going outside. ? Avoid alcohol.  Learn about heart failure and get support as needed.  Get help to maintain or improve your quality of life and your ability to care for yourself as needed. Contact a doctor if:  You gain weight quickly.  You are more short of breath than usual.  You cannot do your normal activities.  You tire easily.  You cough more than normal, especially with activity.  You have any or more puffiness (swelling) in areas such as your hands, feet, ankles, or belly (abdomen).  You cannot sleep because it is hard to breathe.  You feel like your heart is beating fast (palpitations).  You get dizzy or light-headed when you stand up. Get help right away if:  You have trouble breathing.  There is a change in mental status, such as becoming less alert or not being able to focus.  You have chest pain or discomfort.  You faint. This information is not intended to replace advice given to you by your health care  provider. Make sure you discuss any questions you have with your health care provider. Document Released: 03/27/2008 Document Revised: 11/24/2015 Document Reviewed: 08/04/2012 Elsevier Interactive Patient Education  2017 [employer].

## 2018-02-19 LAB — COMPREHENSIVE METABOLIC PANEL WITH GFR
ALT: 78 IU/L — ABNORMAL HIGH (ref 0–44)
AST: 31 IU/L (ref 0–40)
Albumin/Globulin Ratio: 1.8 (ref 1.2–2.2)
Albumin: 4 g/dL (ref 3.5–5.5)
Alkaline Phosphatase: 60 IU/L (ref 39–117)
BUN/Creatinine Ratio: 19 (ref 9–20)
BUN: 17 mg/dL (ref 6–24)
Bilirubin Total: 0.7 mg/dL (ref 0.0–1.2)
CO2: 29 mmol/L (ref 20–29)
Calcium: 9.1 mg/dL (ref 8.7–10.2)
Chloride: 96 mmol/L (ref 96–106)
Creatinine, Ser: 0.9 mg/dL (ref 0.76–1.27)
GFR calc Af Amer: 108 mL/min/1.73
GFR calc non Af Amer: 94 mL/min/1.73
Globulin, Total: 2.2 g/dL (ref 1.5–4.5)
Glucose: 80 mg/dL (ref 65–99)
Potassium: 3.8 mmol/L (ref 3.5–5.2)
Sodium: 148 mmol/L — ABNORMAL HIGH (ref 134–144)
Total Protein: 6.2 g/dL (ref 6.0–8.5)

## 2018-02-19 LAB — BRAIN NATRIURETIC PEPTIDE: BNP: 361 pg/mL — ABNORMAL HIGH (ref 0.0–100.0)

## 2018-02-24 ENCOUNTER — Telehealth: Payer: Self-pay

## 2018-02-24 NOTE — Telephone Encounter (Signed)
-----  Message from [name], MD sent at 02/20/2018  8:12 PM EDT ----- Please notify patient that his BNP was elevated consistent with retention of extra fluid from CHF.  Please make sure that patient increased his Lasix as directed.  Please fax a copy of his labs to his cardiologist.  Please remind patient to follow-up with cardiology or go to the emergency department sooner if he continues to have shortness of breath, swelling, chest pain or any concerns.  Patient's complete metabolic panel showed a mild increase in his sodium level and a mild increase in his ALT which is a liver enzyme but this is improved when compared to labs from his recent hospitalization.

## 2018-02-24 NOTE — Telephone Encounter (Signed)
Patient verified DOB. Patient was informed of most recent lab results and pcp message. Patient verbalized understanding and had no further questions.

## 2018-02-25 ENCOUNTER — Ambulatory Visit: Payer: Self-pay | Attending: Family Medicine

## 2018-02-25 ENCOUNTER — Other Ambulatory Visit: Payer: Self-pay | Admitting: Family Medicine

## 2018-02-25 DIAGNOSIS — I5031 Acute diastolic (congestive) heart failure: Secondary | ICD-10-CM | POA: Insufficient documentation

## 2018-02-25 MED FILL — FUROSEMIDE 20 MG TABLET: 20 | 30 days supply | Qty: 60 | Fill #0

## 2018-02-25 NOTE — Progress Notes (Signed)
Patient presents for a lab only visit today and will have BMP in follow-up of his recent increase in lasix as well as BNP in follow-up of newly diagnosed heart failure. Orders placed.

## 2018-02-25 NOTE — Progress Notes (Signed)
Patient here for lab visit  

## 2018-02-26 ENCOUNTER — Telehealth: Payer: Self-pay

## 2018-02-26 LAB — BASIC METABOLIC PANEL WITH GFR
BUN/Creatinine Ratio: 21 — ABNORMAL HIGH (ref 9–20)
BUN: 18 mg/dL (ref 6–24)
CO2: 25 mmol/L (ref 20–29)
Calcium: 9.3 mg/dL (ref 8.7–10.2)
Chloride: 100 mmol/L (ref 96–106)
Creatinine, Ser: 0.86 mg/dL (ref 0.76–1.27)
GFR calc Af Amer: 110 mL/min/1.73
GFR calc non Af Amer: 96 mL/min/1.73
Glucose: 68 mg/dL (ref 65–99)
Potassium: 3.5 mmol/L (ref 3.5–5.2)
Sodium: 145 mmol/L — ABNORMAL HIGH (ref 134–144)

## 2018-02-26 LAB — BRAIN NATRIURETIC PEPTIDE: BNP: 232.2 pg/mL — ABNORMAL HIGH (ref 0.0–100.0)

## 2018-02-26 NOTE — Telephone Encounter (Signed)
Patient was informed as well as wife, verified DOB, patient spouse verbalized understanding and had no further questions. Patients wife stated that the patient sob has gotten better but the swelling in the legs is still present.

## 2018-02-28 ENCOUNTER — Telehealth: Payer: Self-pay

## 2018-02-28 NOTE — Telephone Encounter (Signed)
-----  Message from [name], MD sent at 02/27/2018  9:05 AM EDT ----- Notify patient/wife that the BNP is slightly better but patient needs to be seen in swelling and SOB have not improved. Very important to follow-up with cardiologist as well

## 2018-02-28 NOTE — Telephone Encounter (Signed)
Patient was called, wife answered and stated that the patient was asleep and would take message. Patients wife verified his dob and was given message. Patients wife verbalized understanding and had no further questions.

## 2018-03-06 ENCOUNTER — Other Ambulatory Visit: Payer: Self-pay

## 2018-03-06 ENCOUNTER — Encounter: Payer: Self-pay | Admitting: Family Medicine

## 2018-03-06 ENCOUNTER — Ambulatory Visit: Payer: Self-pay | Attending: Family Medicine | Admitting: Family Medicine

## 2018-03-06 VITALS — BP 110/76 | HR 89 | Temp 97.7°F | Resp 20 | Ht 72.0 in | Wt 119.4 lb

## 2018-03-06 DIAGNOSIS — Z79899 Other long term (current) drug therapy: Secondary | ICD-10-CM

## 2018-03-06 DIAGNOSIS — I5030 Unspecified diastolic (congestive) heart failure: Secondary | ICD-10-CM | POA: Insufficient documentation

## 2018-03-06 DIAGNOSIS — K761 Chronic passive congestion of liver: Secondary | ICD-10-CM | POA: Insufficient documentation

## 2018-03-06 DIAGNOSIS — F17210 Nicotine dependence, cigarettes, uncomplicated: Secondary | ICD-10-CM | POA: Insufficient documentation

## 2018-03-06 DIAGNOSIS — J449 Chronic obstructive pulmonary disease, unspecified: Secondary | ICD-10-CM

## 2018-03-06 DIAGNOSIS — I272 Pulmonary hypertension, unspecified: Secondary | ICD-10-CM

## 2018-03-06 DIAGNOSIS — M7989 Other specified soft tissue disorders: Secondary | ICD-10-CM | POA: Insufficient documentation

## 2018-03-06 DIAGNOSIS — R748 Abnormal levels of other serum enzymes: Secondary | ICD-10-CM | POA: Insufficient documentation

## 2018-03-06 DIAGNOSIS — I509 Heart failure, unspecified: Secondary | ICD-10-CM

## 2018-03-06 DIAGNOSIS — K219 Gastro-esophageal reflux disease without esophagitis: Secondary | ICD-10-CM

## 2018-03-06 MED ORDER — BUDESONIDE-FORMOTEROL FUMARATE 160-4.5 MCG/ACT IN AERO: 2.0000 | INHALATION_SPRAY | Freq: Two times a day (BID) | RESPIRATORY_TRACT | 11 refills | Status: DC

## 2018-03-06 MED ORDER — FAMOTIDINE 20 MG PO TABS: 20.0000 mg | ORAL_TABLET | Freq: Two times a day (BID) | ORAL | 3 refills | Status: DC

## 2018-03-06 MED ORDER — FUROSEMIDE 20 MG PO TABS: 40.0000 mg | ORAL_TABLET | Freq: Every day | ORAL | 6 refills | Status: DC

## 2018-03-06 MED ORDER — TIOTROPIUM BROMIDE MONOHYDRATE 18 MCG IN CAPS: 18.0000 ug | ORAL_CAPSULE | Freq: Every day | RESPIRATORY_TRACT | 11 refills | Status: DC

## 2018-03-06 MED ORDER — UMECLIDINIUM BROMIDE 62.5 MCG/INH IN AEPB: 1.0000 | INHALATION_SPRAY | Freq: Every day | RESPIRATORY_TRACT | 11 refills | Status: DC

## 2018-03-06 MED ORDER — MOMETASONE FURO-FORMOTEROL FUM 200-5 MCG/ACT IN AERO: 2.0000 | INHALATION_SPRAY | Freq: Two times a day (BID) | RESPIRATORY_TRACT | 11 refills | Status: DC

## 2018-03-06 MED ORDER — ALBUTEROL SULFATE HFA 108 (90 BASE) MCG/ACT IN AERS: 2.0000 | INHALATION_SPRAY | Freq: Four times a day (QID) | RESPIRATORY_TRACT | 11 refills | Status: DC | PRN

## 2018-03-06 NOTE — Progress Notes (Signed)
Subjective:    Patient ID: Anthony Nelson, male    DOB: Feb 23, 1961, 57 y.o.   MRN: [id number]  HPI       57 year old male who was seen in follow-up of recent CHF exacerbation status post hospitalization from 8/10 through 02/10/2018 at which time he was newly diagnosed with COPD with emphysematous changes, heart failure and pulmonary hypertension.  Patient also had elevated liver enzymes due to congestive hepatopathy.  Patient additionally with GERD for which she was placed on famotidine.  At today's visit, patient reports that his lower extremity swelling is greatly improved.  Patient states that he is less short of breath.  Patient does not have his oxygen with him at today's visit.  Patient states that he tries not to use his oxygen unless he is short of breath.  Per wife, she sometimes hears her husband wheezing at night when he is sleeping and she will wake him up and get him to put on his oxygen which stops the wheezing.  Patient states that he has been taking all of his pulmonary medications as prescribed.  Patient states that he continues to smoke but has greatly decreased the amount of cigarettes he uses per day.  Patient believes that he still smokes secondary to stress regarding his medical condition as well as financial concerns regarding when he can return to work.      At today's visit, patient denies any abdominal pain, no nausea, no reflux symptoms.  Patient has had no productive cough.  Patient is not performing daily weights.  Patient denies fever or chills.  Peripheral edema has resolved.  Patient has had no chest pain, no palpitations no muscle cramping.  Patient states that he feels better but still fatigues easily and become short of breath with activity.      Past Medical History:  Diagnosis Date  . Bronchitis   . COPD (chronic obstructive pulmonary disease) ([place])   . Diastolic heart failure ([place])   . GERD (gastroesophageal reflux disease)   . Pulmonary hypertension ([place])   .  Respiratory failure with hypoxia [facility])    Past Surgical History:  Procedure Laterality Date  . HERNIA REPAIR     Social History   Tobacco Use  . Smoking status: Current Every Day Smoker    Packs/day: 0.25    Types: Cigarettes  . Smokeless tobacco: Never Used  Substance Use Topics  . Alcohol use: Not Currently    Comment: Socially   . Drug use: No   Family History  Problem Relation Age of Onset  . Cancer Mother   . Cancer Father     Review of Systems  Constitutional: Positive for fatigue. Negative for chills and fever.  HENT: Negative for congestion and sore throat.   Respiratory: Positive for shortness of breath (Improved). Negative for cough.   Cardiovascular: Negative for chest pain, palpitations and leg swelling.  Gastrointestinal: Negative for abdominal pain and blood in stool.  Genitourinary: Positive for frequency (Related to use of Lasix). Negative for dysuria.  Musculoskeletal: Positive for back pain (Occasional). Negative for gait problem.  Neurological: Negative for dizziness and headaches.       Objective:   Physical Exam BP 110/76 (BP Location: Right Arm, Patient Position: Sitting, Cuff Size: Normal)   Pulse 89   Temp 97.7 F (36.5 C) (Oral)   Resp 20   Ht 6' (1.829 m)   Wt 119 lb 6.4 oz (54.2 kg)   SpO2 92%   BMI 16.19 kg/m  Vital signs and nursing notes reviewed  General- well-nourished well-developed older male in no acute distress.  Patient is not wearing his oxygen and does not have it with him at today's visit.  Patient is accompanied by his wife. ENT- TMs gray, nares with mild edema of the nasal turbinates, mild posterior pharynx erythema Neck-supple, no JVD, no thyromegaly Lungs-clear to auscultation bilaterally Cardiovascular-regular rate and rhythm Abdomen-soft and nontender Back-no CVA tenderness Extremities- no edema     Assessment & Plan:  1. Congestive heart failure, unspecified HF chronicity, unspecified heart failure type  [facility]) Patient with improvement in CHF.  Patient with normal lung exam, decreased shortness of breath and patient does not have any peripheral edema at today's visit.  Patient may decrease Lasix to 20 mg in the a.m. and 10 mg in the late afternoon or early evening.  Patient will have repeat CMP and BNP.  Patient has upcoming cardiology appointment September 26.  Again discussed CHF with patient and his wife and discussed importance of monitoring weight on a daily basis.  Patient and wife now states that they will obtain a scale for home use.  CHF action plan information provided as part of AVS - Comprehensive metabolic panel - Brain natriuretic peptide - furosemide (LASIX) 20 MG tablet; Take 2 tablets (40 mg total) by mouth daily.  Dispense: 60 tablet; Refill: 6  2. Chronic obstructive pulmonary disease, unspecified COPD type ([place]) Patient reports improvement in breathing.  Patient will continue use of Dulera on a daily basis as well as Incruse Ellipta which he is taking in place of Spiriva.  Patient will also continue use of albuterol as needed.  Patient is encouraged to use his oxygen especially with any activity.  Patient states that he has not yet heard anything regarding his cardiology appointment therefore [place] provided patient and wife with the name of the pulmonary office to which he has been referred so that patient may contact the office directly regarding an appointment.  Complete cessation of tobacco use is encouraged. - albuterol (PROVENTIL HFA;VENTOLIN HFA) 108 (90 Base) MCG/ACT inhaler; Inhale 2 puffs into the lungs every 6 (six) hours as needed for wheezing or shortness of breath.  Dispense: 1 Inhaler; Refill: 11 - umeclidinium bromide (INCRUSE ELLIPTA) 62.5 MCG/INH AEPB; Inhale 1 puff into the lungs daily.  Dispense: 30 each; Refill: 11 - mometasone-formoterol (DULERA) 200-5 MCG/ACT AERO; Inhale 2 puffs into the lungs 2 (two) times daily.  Dispense: 1 Inhaler; Refill: 11  3. Pulmonary  hypertension ([place]) Patient with diagnosis of pulmonary hypertension status post recent hospitalization at which time patient also had a severe COPD exacerbation, acute respiratory failure requiring oxygen and acute diastolic heart failure.  Patient is to keep his follow-up with pulmonology.  Patient appears to be stable at this time. 4. Encounter for long-term (current) use of medications Patient will have CMP at today's visit and follow-up of long-term use of medications and patient additionally had elevation of LFTs which was thought to be secondary to hepatic congestion during his hospitalization.  LFTs have been trending down. - Comprehensive metabolic panel  5. Gastroesophageal reflux disease, esophagitis presence not specified Prescription provided for patient to continue the use of Pepcid 20 mg twice daily for GERD - famotidine (PEPCID) 20 MG tablet; Take 1 tablet (20 mg total) by mouth 2 (two) times daily.  Dispense: 60 tablet; Refill: 3  An After Visit Summary was printed and given to the patient.  Return in about 3 months (around 06/05/2018), or if  symptoms worsen or fail to improve, for CHF/COPD.

## 2018-03-06 NOTE — Patient Instructions (Signed)
Heart Failure Action Plan A heart failure action plan helps you understand what to do when you have symptoms of heart failure. Follow the plan that was created by you and your health care provider. Review your plan each time you visit your health care provider. Red zone These signs and symptoms mean you should get medical help right away:  You have trouble breathing when resting.  You have a dry cough that is getting worse.  You have swelling or pain in your legs or abdomen that is getting worse.  You suddenly gain more than 2-3 lb (0.9-1.4 kg) in a day, or more than 5 lb (2.3 kg) in one week. This amount may be more or less depending on your condition.  You have trouble staying awake or you feel confused.  You have chest pain.  You do not have an appetite.  You pass out.  If you experience any of these symptoms:  Call your local emergency services (911 in the [place] right away or seek help at the emergency department of the nearest hospital.  Yellow zone These signs and symptoms mean your condition may be getting worse and you should make some changes:  You have trouble breathing when you are active or you need to sleep with extra pillows.  You have swelling in your legs or abdomen.  You gain 2-3 lb (0.9-1.4 kg) in one day, or 5 lb (2.3 kg) in one week. This amount may be more or less depending on your condition.  You get tired easily.  You have trouble sleeping.  You have a dry cough.  If you experience any of these symptoms:  Contact your health care provider within the next day.  Your health care provider may adjust your medicines.  Green zone These signs mean you are doing well and can continue what you are doing:  You do not have shortness of breath.  You have very little swelling or no new swelling.  Your weight is stable (no gain or loss).  You have a normal activity level.  You do not have chest pain or any other new symptoms.  Follow these  instructions at home:  Take over-the-counter and prescription medicines only as told by your health care provider.  Weigh yourself daily. Your target weight is __________ lb (__________ kg). ? Call your health care provider if you gain more than __________ lb (__________ kg) in a day, or more than __________ lb (__________ kg) in one week.  Eat a heart-healthy diet. Work with a diet and nutrition specialist (dietitian) to create an eating plan that is best for you.  Keep all follow-up visits as told by your health care provider. This is important. Where to find more information:  American Heart Association: www.heart.org Summary  Follow the action plan that was created by you and your health care provider.  Get help right away if you have any symptoms in the Red zone. This information is not intended to replace advice given to you by your health care provider. Make sure you discuss any questions you have with your health care provider. Document Released: 07/28/2016 Document Revised: 07/28/2016 Document Reviewed: 07/28/2016 Elsevier Interactive Patient Education  [employer].

## 2018-03-06 NOTE — Progress Notes (Signed)
Patient has already received the flu shot and patient stated that he was up to date on his tdap.

## 2018-03-07 LAB — COMPREHENSIVE METABOLIC PANEL WITH GFR
ALT: 41 IU/L (ref 0–44)
AST: 24 IU/L (ref 0–40)
Albumin/Globulin Ratio: 1.6 (ref 1.2–2.2)
Albumin: 4.2 g/dL (ref 3.5–5.5)
Alkaline Phosphatase: 66 IU/L (ref 39–117)
BUN/Creatinine Ratio: 18 (ref 9–20)
BUN: 15 mg/dL (ref 6–24)
Bilirubin Total: 0.4 mg/dL (ref 0.0–1.2)
CO2: 20 mmol/L (ref 20–29)
Calcium: 9.2 mg/dL (ref 8.7–10.2)
Chloride: 98 mmol/L (ref 96–106)
Creatinine, Ser: 0.83 mg/dL (ref 0.76–1.27)
GFR calc Af Amer: 112 mL/min/1.73
GFR calc non Af Amer: 97 mL/min/1.73
Globulin, Total: 2.7 g/dL (ref 1.5–4.5)
Glucose: 113 mg/dL — ABNORMAL HIGH (ref 65–99)
Potassium: 4.4 mmol/L (ref 3.5–5.2)
Sodium: 143 mmol/L (ref 134–144)
Total Protein: 6.9 g/dL (ref 6.0–8.5)

## 2018-03-07 LAB — BRAIN NATRIURETIC PEPTIDE: BNP: 111.9 pg/mL — ABNORMAL HIGH (ref 0.0–100.0)

## 2018-03-08 ENCOUNTER — Encounter: Payer: Self-pay | Admitting: Family Medicine

## 2018-03-12 NOTE — Telephone Encounter (Signed)
Patient was called, answered, verified DOB. Patient was informed and verbalized understanding of most recent lab results, he had no further questions.

## 2018-03-12 NOTE — Telephone Encounter (Signed)
-----  Message from [name], MD sent at 03/07/2018  5:05 PM EDT ----- Notify patient of normal CMP (glucose of 113 but patient was likely non-fasting). Also, BNP is improved now at 113 and less than 100 is normal so this is consistent with his improvement in his CHF

## 2018-03-18 ENCOUNTER — Encounter: Payer: Self-pay | Admitting: Internal Medicine

## 2018-03-18 ENCOUNTER — Ambulatory Visit (INDEPENDENT_AMBULATORY_CARE_PROVIDER_SITE_OTHER): Payer: Self-pay | Admitting: Internal Medicine

## 2018-03-18 ENCOUNTER — Telehealth: Payer: Self-pay | Admitting: Internal Medicine

## 2018-03-18 VITALS — BP 106/74 | HR 113 | Ht 69.5 in | Wt 120.0 lb

## 2018-03-18 DIAGNOSIS — J449 Chronic obstructive pulmonary disease, unspecified: Secondary | ICD-10-CM | POA: Insufficient documentation

## 2018-03-18 DIAGNOSIS — I2781 Cor pulmonale (chronic): Secondary | ICD-10-CM

## 2018-03-18 DIAGNOSIS — F17210 Nicotine dependence, cigarettes, uncomplicated: Secondary | ICD-10-CM

## 2018-03-18 LAB — PULMONARY FUNCTION TEST

## 2018-03-18 MED ORDER — GLYCOPYRROLATE-FORMOTEROL 9-4.8 MCG/ACT IN AERO: 2.0000 | INHALATION_SPRAY | Freq: Two times a day (BID) | RESPIRATORY_TRACT | 11 refills | Status: DC

## 2018-03-18 MED ORDER — GLYCOPYRROLATE-FORMOTEROL 9-4.8 MCG/ACT IN AERO: 2.0000 | INHALATION_SPRAY | Freq: Two times a day (BID) | RESPIRATORY_TRACT | 0 refills | Status: DC

## 2018-03-18 NOTE — Patient Instructions (Signed)
Stop dulera and incruse and start  BEVESPI  Take 2 puffs first thing in am and then another 2 puffs about 12 hours later.     Work on inhaler technique:  relax and gently blow all the way out then take a nice smooth deep breath back in, triggering the inhaler at same time you start breathing in.  Hold for up to 5 seconds if you can. Blow out thru nose. Rinse and gargle with water when done     Only use your albuterol as a rescue medication to be used if you can't catch your breath by resting or doing a relaxed purse lip breathing pattern.  - The less you use it, the better it will work when you need it. - Ok to use up to 2 puffs  every 4 hours if you must but call for immediate appointment if use goes up over your usual need - Don't leave home without it !!  (think of it like the spare tire for your car)   The key is to stop smoking completely before smoking completely stops you!    Please schedule a follow up office visit in 4 weeks, sooner if needed  with all medications /inhalers/ solutions in hand so we can verify exactly what you are taking. This includes all medications from all doctors and over the counters - full pfts on return

## 2018-03-18 NOTE — Progress Notes (Signed)
Kimberlee Nearing, male    DOB: 03/22/1961,     MRN: [id number]    Brief patient profile:  57 yobm active smoker  Admit date: 02/08/2018 Discharge date: 02/10/2018   Brief/Interim Summary: For full details see H&P/Progress note, but in brief, Jovi Zavadil is a 57 year old male with no significant medical history other than bronchitis diagnosed a year ago and smoker. Patient presented to the emergency department complaining of shortness of breath, cough, fatigue and leg swelling. Symptoms started 2 to 3 weeks prior to admission. However worsening over day past 2 days. Upon ED evaluation was found to have oxygen saturation of 73% on room air, which improved to 98% on 2 L nasal cannula. Lab work-up revealed mild elevated LFTs, mild elevated lactic acid. CTA with no evidence of PE, however there was evidence of elevated right heart pressure and possible underlying right heart failure. Moderately advanced emphysematous lungs disease predominantly in the upper lungs bilaterally. Patient was admitted with working diagnosis of COPD exacerbation and work-up for right heart failure.  Subjective on day of d/c  Patient seen and examined, he is doing well, denies SOB, palpitations and chest pain. No acute events overnight. Desated with ambulation while on room air.   Discharge Diagnoses/Hospital Course:  Acute respiratory failure with hypoxia Multifactorial felt to be secondary to undiagnosed COPD and pulmonary hypertension, leading to acute diastolic CHF exacerbation. Clinically improved, unable to wean O2 as expected.   COPD exacerbation Clinically correlate with COPD CT with emphysematous changes, long-term smoker and responding to nebulizer and steroids.  Patient was treated with IV Solu-Medrol, Brovana and Pulmicort.  Will transition to prednisone for 5 more days.  Will start on Symbicort, Spiriva and albuterol as needed.  Procalcitonin was negative okay to discontinue doxycycline.  Patient will  need to follow-up with pulmonologist for official PFTs. Will need to continue with oxygen supplementation for now to help with pulmonary hypertension.    Pulmonary hypertension- GP 3 Treat underlying cause, continue oxygen for hypoxemia. Cardiology follow-up as an outpatient.  Acute diastolic CHF Echocardiogram shows severe pulmonary hypertension and grade 1 diastolic dysfunction.LVEF 60 to 65%. PA pressure 80 Elevated BNPPatient was treated with IV Lasix and responded well.  Place it on TED hose, he has mild trace edema.  Patient will be discharged on Lasix 20 mg daily. No further ischemic work-up at this point.  Follow-up with PCP to evaluate renal function and need of continued diuresis.   Elevated liver enzymes Felt to be related to congestive hepatopathy,LFTs trending down.Hepatitis panel negative Monitor CMP in 1 week.   GERD Continue Famotidine   Tobacco abuse Long discussion about cessation   03/18/2018  Pulmonary/ new pt eval / Reanna Scoggin  Chief Complaint  Patient presents with  . Pulmonary Consult    Referred by Dr. Chapman Fitch for eval of COPD. Pt states he was dxed with COPD while he was in the hospital approx 1 month. He states he has occ SOB, mainly when he gets in a rush. He has an albuterol inhaler that he uses once per wk on average.   Dyspnea:  MMRC2 = can't walk a nl pace on a flat grade s sob but does fine slow and flat  s 02 Cough: very little  Sleep: 2 pillows / no am congestion  SABA use: rare now  Wife also smokes  Says 02 sats were fine s 02 when last checked so has no home 02    No obvious day to day or daytime variability  or assoc excess/ purulent sputum or mucus plugs or hemoptysis or cp or chest tightness, subjective wheeze or overt sinus or hb symptoms.   Sleeps as above  without nocturnal  or early am exacerbation  of respiratory  c/o's or need for noct saba. Also denies any obvious fluctuation of symptoms with weather or environmental changes or other  aggravating or alleviating factors except as outlined above   No unusual exposure hx or h/o childhood pna/ asthma or knowledge of premature birth.  Current Allergies, Complete Past Medical History, Past Surgical History, Family History, and Social History were reviewed in [employer] record.  ROS  The following are not active complaints unless bolded Hoarseness, sore throat, dysphagia, dental problems, itching, sneezing,  nasal congestion or discharge of excess mucus or purulent secretions, ear ache,   fever, chills, sweats, unintended wt loss or wt gain, classically pleuritic or exertional cp,  orthopnea pnd or arm/hand swelling  or leg swelling, presyncope, palpitations, abdominal pain, anorexia, nausea, vomiting, diarrhea  or change in bowel habits or change in bladder habits, change in stools or change in urine, dysuria, hematuria,  rash, arthralgias, visual complaints, headache, numbness, weakness or ataxia or problems with walking or coordination,  change in mood or  memory.              Past Medical History:  Diagnosis Date  . Bronchitis   . COPD (chronic obstructive pulmonary disease) ([place])   . Diastolic heart failure ([place])   . GERD (gastroesophageal reflux disease)   . Pulmonary hypertension ([place])   . Respiratory failure with hypoxia [facility])     Outpatient Medications Prior to Visit  Medication Sig Dispense Refill  . albuterol (PROVENTIL HFA;VENTOLIN HFA) 108 (90 Base) MCG/ACT inhaler Inhale 2 puffs into the lungs every 6 (six) hours as needed for wheezing or shortness of breath. 1 Inhaler 11  . famotidine (PEPCID) 20 MG tablet Take 1 tablet (20 mg total) by mouth 2 (two) times daily. 60 tablet 3  . furosemide (LASIX) 20 MG tablet Take 2 tablets (40 mg total) by mouth daily. 60 tablet 6  . mometasone-formoterol (DULERA) 200-5 MCG/ACT AERO Inhale 2 puffs into the lungs 2 (two) times daily. 1 Inhaler 11  . OXYGEN 2lpm as needed per pt  [facility]    . umeclidinium  bromide (INCRUSE ELLIPTA) 62.5 MCG/INH AEPB Inhale 1 puff into the lungs daily. 30 each 11  . nicotine (NICODERM CQ - DOSED IN MG/24 HOURS) 14 mg/24hr patch Place 1 patch (14 mg total) onto the skin daily. 28 patch 0               Objective:     BP 106/74 (BP Location: Left Arm, Cuff Size: Normal)   Pulse (!) 113   Ht 5' 9.5" (1.765 m)   Wt 120 lb (54.4 kg)   SpO2 94%   BMI 17.47 kg/m   SpO2: 94 %  RA     HEENT: nl dentition / oropharynx. Nl external ear canals without cough reflex -  Mild bilateral non-specific turbinate edema     NECK :  without JVD/Nodes/TM/ nl carotid upstrokes bilaterally   LUNGS: no acc muscle use,  Mod barrel  contour chest wall with bilateral  Distant bs s audible wheeze and  without cough on insp or exp maneuver and mod  Hyperresonant  to  percussion bilaterally     CV:  RRR  no s3 or murmur or increase in P2, and no edema  ABD:  soft and nontender with pos end  insp Hoover's  in the supine position. No bruits or organomegaly appreciated, bowel sounds nl  MS:   Nl gait/  ext warm without deformities, calf tenderness, cyanosis or clubbing No obvious joint restrictions   SKIN: warm and dry without lesions    NEURO:  alert, approp, nl sensorium with  no motor or cerebellar deficits apparent.         I personally reviewed images and agree with radiology impression as follows:   Chest CTa 02/08/18 1. No evidence of pulmonary embolism. 2. Evidence of elevated right heart pressures and underlying right heart failure with dilated right-sided heart chambers and reflux of contrast into the intrahepatic IVC and central hepatic veins. No overt airspace edema or pleural fluid. 3. Moderately advanced emphysematous lung disease, predominantly in the upper lung zones bilaterally. No acute pulmonary findings.         Assessment   COPD GOLD IV still smoking Spirometry 03/18/2018  FEV1 0.7 (22%)  Ratio 42 with classic curvature p dulera 200/  incruse  - 03/18/2018  After extensive coaching inhaler device,  effectiveness =    75% > try bevespi 2bid   Initial eval suggests GOLD IV copd with group D symptoms now but limited funds for meds so reasonable to just try bevespi samples and if effective can get for free thru [place] and me program     Cigarette smoker 4-5 min discussion re active cigarette smoking in addition to office E&M  Ask about tobacco use:   Ongoing / as is wife  Advise quitting   4-5 min discussion re active cigarette smoking in addition to office E&M  Ask about tobacco use:   ongoing Advise quitting   I emphasized that although we never turn away smokers from the pulmonary clinic, we do ask that they understand that the recommendations that we make  won't work nearly as well in the presence of continued cigarette exposure. In fact, we may very well  reach a point where we can't promise to help the patient if he/she can't quit smoking. (We can and will promise to try to help, we just can't promise what we recommend will really work)  Assess willingness:  Not committed at this point Assist in quit attempt:  Per PCP when ready Arrange follow up:   Follow up per Primary Care planned       Assess willingness:  Not committed at this point Assist in quit attempt:  Per PCP when ready Arrange follow up:   Follow up per Primary Care planned        Cor pulmonale (chronic) ([place]) Echo 02/08/18 - Normal LV systolic function; mild diastolic dysfunction; mild   LVH; D shaped septum; mild RAE; moderate RVE with moderate to   severe RV dysfunction; moderate TR with severe pulmonary   hypertension; small pericardial effusion.   Need to maintain sats > 90% in this setting so critical he not smoke and monitor his sats over time     Total time devoted to counseling  > 50 % of initial 60 min office visit:  review case with pt/ discussion of options/alternatives/ personally creating written customized instructions  in presence of  pt  then going over those specific  Instructions directly with the pt including how to use all of the meds but in particular covering each new medication in detail and the difference between the maintenance= "automatic" meds and the prns using an action plan format  for the latter (If this problem/symptom => do that organization reading Left to right).  Please see AVS from this visit for a full list of these instructions which I personally wrote for this pt and  are unique to this visit.   See device teaching which extended face to face time for this visit   Christinia Gully, MD 03/18/2018

## 2018-03-18 NOTE — Telephone Encounter (Signed)
Spoke with patient, requesting work note. Patient was in ER 08/10 and has not been back to work. Would like a work note from 02/08/2018 thru 03/31/2018.   MW please advise if okay to give patient work note covering those dates. Thanks.

## 2018-03-19 ENCOUNTER — Encounter: Payer: Self-pay | Admitting: Internal Medicine

## 2018-03-19 DIAGNOSIS — I2781 Cor pulmonale (chronic): Secondary | ICD-10-CM | POA: Insufficient documentation

## 2018-03-19 NOTE — Telephone Encounter (Signed)
Called and spoke with patient advised that we have written letter and it is placed up front for pick up. Patient verbalized understanding. Nothing further needed.

## 2018-03-19 NOTE — Progress Notes (Signed)
Letter typed for work.

## 2018-03-19 NOTE — Telephone Encounter (Signed)
Ok to write letter for out of work for those dates and return s restrictions

## 2018-03-19 NOTE — Assessment & Plan Note (Signed)
Echo 02/08/18 - Normal LV systolic function; mild diastolic dysfunction; mild   LVH; D shaped septum; mild RAE; moderate RVE with moderate to   severe RV dysfunction; moderate TR with severe pulmonary   hypertension; small pericardial effusion.   Need to maintain sats > 90% in this setting so critical he not smoke and monitor his sats over time     Total time devoted to counseling  > 50 % of initial 60 min office visit:  review case with pt/ discussion of options/alternatives/ personally creating written customized instructions  in presence of pt  then going over those specific  Instructions directly with the pt including how to use all of the meds but in particular covering each new medication in detail and the difference between the maintenance= "automatic" meds and the prns using an action plan format for the latter (If this problem/symptom => do that organization reading Left to right).  Please see AVS from this visit for a full list of these instructions which I personally wrote for this pt and  are unique to this visit.

## 2018-03-19 NOTE — Assessment & Plan Note (Signed)
4-5 min discussion re active cigarette smoking in addition to office E&M  Ask about tobacco use:   Ongoing / as is wife  Advise quitting   4-5 min discussion re active cigarette smoking in addition to office E&M  Ask about tobacco use:   ongoing Advise quitting   I emphasized that although we never turn away smokers from the pulmonary clinic, we do ask that they understand that the recommendations that we make  won't work nearly as well in the presence of continued cigarette exposure. In fact, we may very well  reach a point where we can't promise to help the patient if he/she can't quit smoking. (We can and will promise to try to help, we just can't promise what we recommend will really work)  Assess willingness:  Not committed at this point Assist in quit attempt:  Per PCP when ready Arrange follow up:   Follow up per Primary Care planned       Assess willingness:  Not committed at this point Assist in quit attempt:  Per PCP when ready Arrange follow up:   Follow up per Primary Care planned

## 2018-03-19 NOTE — Assessment & Plan Note (Signed)
Spirometry 03/18/2018  FEV1 0.7 (22%)  Ratio 42 with classic curvature p dulera 200/ incruse  - 03/18/2018  After extensive coaching inhaler device,  effectiveness =    75% > try bevespi 2bid   Initial eval suggests GOLD IV copd with group D symptoms now but limited funds for meds so reasonable to just try bevespi samples and if effective can get for free thru [place] and me program

## 2018-03-27 ENCOUNTER — Ambulatory Visit: Payer: Self-pay | Admitting: Cardiovascular Disease

## 2018-04-04 ENCOUNTER — Encounter

## 2018-04-04 ENCOUNTER — Ambulatory Visit (INDEPENDENT_AMBULATORY_CARE_PROVIDER_SITE_OTHER): Payer: Self-pay | Admitting: Cardiovascular Disease

## 2018-04-04 ENCOUNTER — Encounter: Payer: Self-pay | Admitting: Cardiovascular Disease

## 2018-04-04 VITALS — BP 102/74 | HR 99 | Ht 71.0 in | Wt 121.0 lb

## 2018-04-04 DIAGNOSIS — I272 Pulmonary hypertension, unspecified: Secondary | ICD-10-CM

## 2018-04-04 DIAGNOSIS — J449 Chronic obstructive pulmonary disease, unspecified: Secondary | ICD-10-CM

## 2018-04-04 DIAGNOSIS — I2723 Pulmonary hypertension due to lung diseases and hypoxia: Secondary | ICD-10-CM

## 2018-04-04 DIAGNOSIS — Z720 Tobacco use: Secondary | ICD-10-CM

## 2018-04-04 NOTE — Progress Notes (Signed)
Cardiology Office Note   Date:  04/04/2018   ID:  Anthony Nelson, DOB 1960-08-26, MRN [id number]  PCP:  Anthony Blackbird, MD  Cardiologist:   Anthony Latch, MD   Chief Complaint  Patient presents with  . Follow-up  . Shortness of Breath    Slight.    History of Present Illness: Anthony Nelson is a 56 y.o. male with COPD, severe pulmonary hypertension, and tobacco abuse who is being seen today for the evaluation of pulmonary hypertension at the request of Anthony Blackbird, MD.  [name] was admitted 01/2018 with a COPD exacerbation. This was a new diagnosis for him.  [name] has a longstanding history of tobacco abuse.  When he came to the ED he had progressive dyspnea.  He also had lower extreity edema but no orthopnea or PND.  He was hypoxic to 73% on room air and improved to >98% on 2L.  Chest CT was negative for PE but suggested elevated R heart disease.  It also showed moderate emphysema.  Echo that admission revealed LVEF 60-65% with grade 1 diastolic dysfunction, moderately dilated RV with moderate-severely reduced systolic function.  PASP was 80 mmHg.  Labs were notable for mildly elevated LFTs.  BNP was 825.  He was treated with IV steroids and inhalers.  He was also diuresed but continued to have an oxygen requirement at discharge.  Since that time he has been able to come off oxygen and maintains sats >88% at rest and with exertion.  Since being discharged [name] has been feeling well.  He had a cold last week from being around his grandchildren.  He no longer has LE edema.  He has a cough productive of white/yellow phlegm.  He also notes that he ran out of glycopyrrolate over a week ago.  He is trying to cut back on smoking and now a pack will last him 5-7 days.  The patches were helpful in the hospital. He isn't exercising much.  He denies chest pain and his breathing has been stable.  He snores but denies apnea.  He feels rested in the AM.  He has experienced a change in his sex  drive and has decreased desire as well as difficulty achieving an erection.   Past Medical History:  Diagnosis Date  . Bronchitis   . COPD (chronic obstructive pulmonary disease) ([place])   . Diastolic heart failure ([place])   . GERD (gastroesophageal reflux disease)   . Pulmonary hypertension ([place])   . Respiratory failure with hypoxia [facility])     Past Surgical History:  Procedure Laterality Date  . HERNIA REPAIR       Current Outpatient Medications  Medication Sig Dispense Refill  . albuterol (PROVENTIL HFA;VENTOLIN HFA) 108 (90 Base) MCG/ACT inhaler Inhale 2 puffs into the lungs every 6 (six) hours as needed for wheezing or shortness of breath. 1 Inhaler 11  . famotidine (PEPCID) 20 MG tablet Take 1 tablet (20 mg total) by mouth 2 (two) times daily. 60 tablet 3  . furosemide (LASIX) 20 MG tablet Take 2 tablets (40 mg total) by mouth daily. 60 tablet 6  . Glycopyrrolate-Formoterol (BEVESPI AEROSPHERE) 9-4.8 MCG/ACT AERO Inhale 2 puffs into the lungs 2 (two) times daily. 1 Inhaler 11  . OXYGEN 2lpm as needed per pt  [facility]     No current facility-administered medications for this visit.     Allergies:   Erythromycin    Social History:  The patient  reports that he has been  smoking cigarettes. He has a 44.00 pack-year smoking history. He has never used smokeless tobacco. He reports that he drank alcohol. He reports that he does not use drugs.   Family History:  The patient's family history includes CAD in his father; Cancer in his father and mother; Diabetes in his maternal grandmother; Heart attack in his paternal grandfather; Heart failure in his father; Kidney cancer in his paternal grandmother; Kidney disease in his maternal grandmother.    ROS:  Please see the history of present illness.   Otherwise, review of systems are positive for none.   All other systems are reviewed and negative.    PHYSICAL EXAM: VS:  BP 102/74 (BP Location: Left Arm, Patient Position: Sitting, Cuff Size:  Normal)   Pulse 99   Ht _0  (1.803 m)   Wt 121 lb (54.9 kg)   BMI 16.88 kg/m  , BMI Body mass index is 16.88 kg/m. GENERAL:  Well appearing HEENT:  Pupils equal round and reactive, fundi not visualized, oral mucosa unremarkable NECK:  No jugular venous distention, waveform within normal limits, carotid upstroke brisk and symmetric, no bruits, no thyromegaly LYMPHATICS:  No cervical adenopathy LUNGS:  Clear to auscultation bilaterally HEART:  RRR.  PMI not displaced or sustained,S1 and S2 within normal limits, no S3, no S4, no clicks, no rubs, no  murmurs ABD:  Flat, positive bowel sounds normal in frequency in pitch, no bruits, no rebound, no guarding, no midline pulsatile mass, no hepatomegaly, no splenomegaly EXT:  2 plus pulses throughout, no edema, no cyanosis no clubbing SKIN:  No rashes no nodules NEURO:  Cranial nerves II through XII grossly intact, motor grossly intact throughout PSYCH:  Cognitively intact, oriented to person place and time    EKG:  EKG is ordered today. The ekg ordered today demonstrates sinus rhythm.  Rate 99 bpm.  Right atrial enlargement.  Anterior T wave and normalities.   Recent Labs: 02/08/2018: Hemoglobin 19.0; Platelets 170 03/06/2018: ALT 41; BNP 111.9; BUN 15; Creatinine, Ser 0.83; Potassium 4.4; Sodium 143    Lipid Panel No results found for: CHOL, TRIG, HDL, CHOLHDL, VLDL, LDLCALC, LDLDIRECT    Wt Readings from Last 3 Encounters:  04/04/18 121 lb (54.9 kg)  03/18/18 120 lb (54.4 kg)  03/06/18 119 lb 6.4 oz (54.2 kg)      ASSESSMENT AND PLAN:  # Severe pulmonary hypertension: # Tobacco abuse: PASP was 80 mmHg in the setting of newly diagnosed COPD exacerbation.  Likely WHO Class III in the setting of lung disease.  We discussed the importance of smoking cessation.  He will call 1-800-QUIT-NOW and work on getting some patches.  He is also out of some of his pulmonary medications.  We discussed the importance of taking his meds as  prescribed.   He will continue to monitor his oxygen saturation to maintain >88% and use supplemental oxygen as needed.  He is experiencing ED but not safe to use PDE-5 inhibitors at this time.  We will repeat his echo in 2 months to see if there is improvement in the pulmonary pressure with treatement of COPD and diuresis.  Check ANA, RF and ANCA.  We discussed smoking cessation for 5 minutes.   Current medicines are reviewed at length with the patient today.  The patient does not have concerns regarding medicines.  The following changes have been made:  no change  Labs/ tests ordered today include:  No orders of the defined types were placed in this encounter.  Disposition:   FU with Rajan Burgard C. Oval Linsey, MD, [facility] in 2 months.     Signed, Cambrea Kirt C. Oval Linsey, MD, [facility]  04/04/2018 4:01 PM    [place]

## 2018-04-04 NOTE — Patient Instructions (Addendum)
  Medication Instructions:  Your physician recommends that you continue on your current medications as directed. Please refer to the Current Medication list given to you today.  If you need a refill on your cardiac medications before your next appointment, please call your pharmacy.   Lab work: ANA/RA [place]  If you have labs (blood work) drawn today and your tests are completely normal, you will receive your results only by: Marland Kitchen MyChart Message (if you have MyChart) OR . A paper copy in the mail If you have any lab test that is abnormal or we need to change your treatment, we will call you to review the results.  Testing/Procedures: Your physician has requested that you have an echocardiogram. Echocardiography is a painless test that uses sound waves to create images of your heart. It provides your doctor with information about the size and shape of your heart and how well your heart's chambers and valves are working. This procedure takes approximately one hour. There are no restrictions for this procedure.  [facility] [place] [address]  Follow-Up: At [facility], you and your health needs are our priority.  As part of our continuing mission to provide you with exceptional heart care, we have created designated Provider Care Teams.  These Care Teams include your primary Cardiologist (physician) and Advanced Practice Providers (APPs -  Physician Assistants and Nurse Practitioners) who all work together to provide you with the care you need, when you need it. You will need a follow up appointment in 2 months FEW DAYS AFTER ECHO You may see Skeet Latch, MD or one of the following Advanced Practice Providers on your designated Care Team:   Kerin Ransom, PA-C Roby Lofts, [place] . Sande Rives, PA-C

## 2018-04-08 MED FILL — BEVESPI AEROSPHERE INHALER: 9-4.8 | 30 days supply | Qty: 11 | Fill #0

## 2018-04-10 ENCOUNTER — Encounter: Payer: Self-pay | Admitting: Cardiovascular Disease

## 2018-04-10 DIAGNOSIS — J449 Chronic obstructive pulmonary disease, unspecified: Secondary | ICD-10-CM

## 2018-04-10 DIAGNOSIS — I2723 Pulmonary hypertension due to lung diseases and hypoxia: Secondary | ICD-10-CM | POA: Insufficient documentation

## 2018-04-10 HISTORY — DX: Pulmonary hypertension due to lung diseases and hypoxia: J44.9

## 2018-04-10 HISTORY — DX: Pulmonary hypertension due to lung diseases and hypoxia: I27.23

## 2018-04-15 ENCOUNTER — Ambulatory Visit: Payer: Self-pay | Admitting: Internal Medicine

## 2018-05-05 ENCOUNTER — Ambulatory Visit: Payer: Self-pay | Admitting: Internal Medicine

## 2018-05-28 MED FILL — BEVESPI AEROSPHERE INHALER: 9-4.8 | 30 days supply | Qty: 11 | Fill #1

## 2018-05-28 MED FILL — !INCRUSE ELLIPTA 62.5 MCG I: 62.5 | 30 days supply | Qty: 30 | Fill #0

## 2018-05-28 MED FILL — ALBUTEROL SULFATE HFA 108 (: 108 (90 BAS | 25 days supply | Qty: 9 | Fill #0

## 2018-06-05 ENCOUNTER — Ambulatory Visit: Payer: Self-pay | Admitting: Family Medicine

## 2018-06-12 ENCOUNTER — Ambulatory Visit: Payer: Self-pay | Admitting: Internal Medicine

## 2018-06-16 ENCOUNTER — Other Ambulatory Visit (HOSPITAL_COMMUNITY): Payer: Self-pay

## 2018-06-24 ENCOUNTER — Ambulatory Visit: Payer: Self-pay | Admitting: Cardiovascular Disease

## 2018-06-26 ENCOUNTER — Encounter: Payer: Self-pay | Admitting: Cardiovascular Disease

## 2018-07-04 ENCOUNTER — Ambulatory Visit: Payer: Self-pay | Admitting: Cardiovascular Disease

## 2018-07-04 ENCOUNTER — Ambulatory Visit: Payer: Self-pay | Admitting: Internal Medicine

## 2018-07-08 ENCOUNTER — Ambulatory Visit: Payer: Self-pay | Admitting: Cardiology

## 2018-07-08 ENCOUNTER — Telehealth: Payer: Self-pay

## 2018-07-08 NOTE — Telephone Encounter (Signed)
New message     Just an FYI. We have made several attempts to contact this patient including sending a letter to schedule or reschedule their echocardiogram. We will be removing the patient from the echo WQ.   Thank you

## 2018-07-17 NOTE — Telephone Encounter (Signed)
Will forward to Dr Oval Linsey as Juluis Rainier

## 2018-07-23 ENCOUNTER — Ambulatory Visit: Payer: Self-pay | Admitting: Cardiology

## 2018-07-23 MED FILL — !VENTOLIN HFA INHALER: 108 (90 BAS | 25 days supply | Qty: 18 | Fill #0

## 2018-07-23 MED FILL — BEVESPI AEROSPHERE INHALER: 9-4.8 | 30 days supply | Qty: 11 | Fill #1

## 2018-07-23 MED FILL — !INCRUSE ELLIPTA 62.5 MCG I: 62.5 | 30 days supply | Qty: 30 | Fill #1

## 2018-07-23 NOTE — Progress Notes (Deleted)
Cardiology Office Note   Date:  07/23/2018   ID:  Rober Skeels, DOB 07-May-1961, MRN [id number]  PCP:  [name], MD  Cardiologist:  Skeet Latch, MD EP: None  No chief complaint on file.     History of Present Illness: Anthony Nelson is a 58 y.o. male with PMH of COPD, severe pulmonary HTN, and tobacco abuse, who presents for follow-up of his pulmonary HTN.  Patient was last seen by cardiology, Dr. Oval Linsey, outpatient 04/2018 for initial evaluation of pulmonary HTN. His echocardiogram revealed a PASP of 44mHg in the setting of newly diagnosed COPD exacerbation. He was recommended for repeat echocardiogram in 2 months to monitor for improvement in pulmonary pressures - this has not been completed yet.   He returns today for follow-up of his pulmonary HTN.  1. Pulmonary HTN: echo 01/2018 with PA pressures 80 mmHg in the setting of COPD exacerbation. Recommended for f/u echo, however this has not been completed yet.   2. COPD:  3. Tobacco abuse:     Past Medical History:  Diagnosis Date  . Bronchitis   . COPD (chronic obstructive pulmonary disease) ([place]   . Diastolic heart failure ([place]   . GERD (gastroesophageal reflux disease)   . Pulmonary hypertension ([place]   . 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 ([facility] 04/10/2018  . Respiratory failure with hypoxia ([facility]     Past Surgical History:  Procedure Laterality Date  . HERNIA REPAIR       Current Outpatient Medications  Medication Sig Dispense Refill  . albuterol (PROVENTIL HFA;VENTOLIN HFA) 108 (90 Base) MCG/ACT inhaler Inhale 2 puffs into the lungs every 6 (six) hours as needed for wheezing or shortness of breath. 1 Inhaler 11  . famotidine (PEPCID) 20 MG tablet Take 1 tablet (20 mg total) by mouth 2 (two) times daily. 60 tablet 3  . furosemide (LASIX) 20 MG tablet Take 2 tablets (40 mg total) by mouth daily. 60 tablet 6  . Glycopyrrolate-Formoterol (BEVESPI AEROSPHERE) 9-4.8  MCG/ACT AERO Inhale 2 puffs into the lungs 2 (two) times daily. 1 Inhaler 11  . OXYGEN 2lpm as needed per pt  [facility]     No current facility-administered medications for this visit.     Allergies:   Erythromycin    Social History:  The patient  reports that he has been smoking cigarettes. He has a 44.00 pack-year smoking history. He has never used smokeless tobacco. He reports previous alcohol use. He reports that he does not use drugs.   Family History:  The patient's ***family history includes CAD in his father; Cancer in his father and mother; Diabetes in his maternal grandmother; Heart attack in his paternal grandfather; Heart failure in his father; Kidney cancer in his paternal grandmother; Kidney disease in his maternal grandmother.    ROS:  Please see the history of present illness.   Otherwise, review of systems are positive for {NONE DEFAULTED:18576::"none"}.   All other systems are reviewed and negative.    PHYSICAL EXAM: VS:  There were no vitals taken for this visit. , BMI There is no height or weight on file to calculate BMI. GEN: Well nourished, well developed, in no acute distress HEENT: normal Neck: no JVD, carotid bruits, or masses Cardiac: ***RRR; no murmurs, rubs, or gallops,no edema  Respiratory:  clear to auscultation bilaterally, normal work of breathing GI: soft, nontender, nondistended, + BS MS: no deformity or atrophy Skin: warm and dry, no rash Neuro:  Strength and sensation are  intact Psych: euthymic mood, full affect   EKG:  EKG {ACTION; IS/IS [id number]} ordered today. The ekg ordered today demonstrates ***   Recent Labs: 02/08/2018: Hemoglobin 19.0; Platelets 170 03/06/2018: ALT 41; BNP 111.9; BUN 15; Creatinine, Ser 0.83; Potassium 4.4; Sodium 143    Lipid Panel No results found for: CHOL, TRIG, HDL, CHOLHDL, VLDL, LDLCALC, LDLDIRECT    Wt Readings from Last 3 Encounters:  04/04/18 121 lb (54.9 kg)  03/18/18 120 lb (54.4 kg)  03/06/18 119 lb  6.4 oz (54.2 kg)      Other studies Reviewed: Additional studies/ records that were reviewed today include: ***.    ASSESSMENT AND PLAN:  1.  ***   Current medicines are reviewed at length with the patient today.  The patient {ACTIONS; HAS/DOES NOT HAVE:19233} concerns regarding medicines.  The following changes have been made:  {PLAN; NO CHANGE:13088:s}  Labs/ tests ordered today include: *** No orders of the defined types were placed in this encounter.    Disposition:   FU with *** in {gen number 3-79:024097} {Days to years:10300}  Signed, Abigail Butts, PA-C  07/23/2018 8:43 AM

## 2018-09-30 MED FILL — !VENTOLIN HFA INHALER: 108 (90 BAS | 25 days supply | Qty: 18 | Fill #1

## 2018-09-30 MED FILL — !INCRUSE ELLIPTA 62.5 MCG I: 62.5 | 30 days supply | Qty: 30 | Fill #2

## 2018-09-30 MED FILL — BEVESPI AEROSPHERE INHALER: 9-4.8 | 30 days supply | Qty: 11 | Fill #2

## 2018-11-17 MED FILL — !VENTOLIN HFA INHALER: 108 (90 BAS | 25 days supply | Qty: 18 | Fill #2

## 2018-11-17 MED FILL — !INCRUSE ELLIPTA 62.5 MCG I: 62.5 | 30 days supply | Qty: 30 | Fill #3

## 2018-11-17 MED FILL — BEVESPI AEROSPHERE INHALER: 9-4.8 | 30 days supply | Qty: 11 | Fill #3

## 2019-01-21 MED FILL — ALBUTEROL SULFATE HFA 108 (: 108 (90 BAS | 25 days supply | Qty: 18 | Fill #3

## 2019-01-21 MED FILL — !INCRUSE ELLIPTA 62.5 MCG I: 62.5 | 30 days supply | Qty: 30 | Fill #4

## 2019-04-24 ENCOUNTER — Emergency Department (HOSPITAL_COMMUNITY): Payer: Self-pay

## 2019-04-24 ENCOUNTER — Inpatient Hospital Stay (HOSPITAL_COMMUNITY)
Admission: EM | Admit: 2019-04-24 | Discharge: 2019-04-30 | DRG: 291 | Disposition: A | Discharge status: Home Health | Payer: Self-pay | Attending: Internal Medicine | Admitting: Internal Medicine

## 2019-04-24 ENCOUNTER — Encounter (HOSPITAL_COMMUNITY): Payer: Self-pay | Admitting: Emergency Medicine

## 2019-04-24 ENCOUNTER — Other Ambulatory Visit: Payer: Self-pay

## 2019-04-24 DIAGNOSIS — Z79899 Other long term (current) drug therapy: Secondary | ICD-10-CM

## 2019-04-24 DIAGNOSIS — J9612 Chronic respiratory failure with hypercapnia: Secondary | ICD-10-CM | POA: Diagnosis present

## 2019-04-24 DIAGNOSIS — I509 Heart failure, unspecified: Secondary | ICD-10-CM

## 2019-04-24 DIAGNOSIS — Z9981 Dependence on supplemental oxygen: Secondary | ICD-10-CM

## 2019-04-24 DIAGNOSIS — J9621 Acute and chronic respiratory failure with hypoxia: Secondary | ICD-10-CM | POA: Diagnosis present

## 2019-04-24 DIAGNOSIS — J439 Emphysema, unspecified: Secondary | ICD-10-CM | POA: Diagnosis present

## 2019-04-24 DIAGNOSIS — J9611 Chronic respiratory failure with hypoxia: Secondary | ICD-10-CM | POA: Diagnosis present

## 2019-04-24 DIAGNOSIS — J9601 Acute respiratory failure with hypoxia: Secondary | ICD-10-CM | POA: Diagnosis present

## 2019-04-24 DIAGNOSIS — Z20828 Contact with and (suspected) exposure to other viral communicable diseases: Secondary | ICD-10-CM | POA: Diagnosis present

## 2019-04-24 DIAGNOSIS — I110 Hypertensive heart disease with heart failure: Principal | ICD-10-CM | POA: Diagnosis present

## 2019-04-24 DIAGNOSIS — I5033 Acute on chronic diastolic (congestive) heart failure: Secondary | ICD-10-CM | POA: Diagnosis present

## 2019-04-24 DIAGNOSIS — I2721 Secondary pulmonary arterial hypertension: Secondary | ICD-10-CM | POA: Diagnosis present

## 2019-04-24 DIAGNOSIS — D751 Secondary polycythemia: Secondary | ICD-10-CM | POA: Diagnosis present

## 2019-04-24 DIAGNOSIS — Z8249 Family history of ischemic heart disease and other diseases of the circulatory system: Secondary | ICD-10-CM

## 2019-04-24 DIAGNOSIS — Z9119 Patient's noncompliance with other medical treatment and regimen: Secondary | ICD-10-CM

## 2019-04-24 DIAGNOSIS — Z881 Allergy status to other antibiotic agents status: Secondary | ICD-10-CM

## 2019-04-24 DIAGNOSIS — I071 Rheumatic tricuspid insufficiency: Secondary | ICD-10-CM | POA: Diagnosis present

## 2019-04-24 DIAGNOSIS — Z8051 Family history of malignant neoplasm of kidney: Secondary | ICD-10-CM

## 2019-04-24 DIAGNOSIS — F17210 Nicotine dependence, cigarettes, uncomplicated: Secondary | ICD-10-CM | POA: Diagnosis present

## 2019-04-24 DIAGNOSIS — I959 Hypotension, unspecified: Secondary | ICD-10-CM | POA: Diagnosis present

## 2019-04-24 DIAGNOSIS — J449 Chronic obstructive pulmonary disease, unspecified: Secondary | ICD-10-CM | POA: Diagnosis present

## 2019-04-24 DIAGNOSIS — Z833 Family history of diabetes mellitus: Secondary | ICD-10-CM

## 2019-04-24 DIAGNOSIS — K219 Gastro-esophageal reflux disease without esophagitis: Secondary | ICD-10-CM | POA: Diagnosis present

## 2019-04-24 DIAGNOSIS — I2781 Cor pulmonale (chronic): Secondary | ICD-10-CM | POA: Diagnosis present

## 2019-04-24 DIAGNOSIS — K761 Chronic passive congestion of liver: Secondary | ICD-10-CM | POA: Diagnosis present

## 2019-04-24 DIAGNOSIS — E876 Hypokalemia: Secondary | ICD-10-CM | POA: Diagnosis present

## 2019-04-24 DIAGNOSIS — I5032 Chronic diastolic (congestive) heart failure: Secondary | ICD-10-CM | POA: Diagnosis present

## 2019-04-24 DIAGNOSIS — E874 Mixed disorder of acid-base balance: Secondary | ICD-10-CM | POA: Diagnosis present

## 2019-04-24 LAB — CBC
HCT: 56.7 % — ABNORMAL HIGH (ref 39.0–52.0)
Hemoglobin: 18.1 g/dL — ABNORMAL HIGH (ref 13.0–17.0)
MCH: 33.8 pg (ref 26.0–34.0)
MCHC: 31.9 g/dL (ref 30.0–36.0)
MCV: 106 fL — ABNORMAL HIGH (ref 80.0–100.0)
Platelets: 196 10*3/uL (ref 150–400)
RBC: 5.35 MIL/uL (ref 4.22–5.81)
RDW: 15.5 % (ref 11.5–15.5)
WBC: 5.7 10*3/uL (ref 4.0–10.5)
nRBC: 0 % (ref 0.0–0.2)

## 2019-04-24 LAB — BASIC METABOLIC PANEL
Anion gap: 13 (ref 5–15)
BUN: 25 mg/dL — ABNORMAL HIGH (ref 6–20)
CO2: 38 mmol/L — ABNORMAL HIGH (ref 22–32)
Calcium: 9.2 mg/dL (ref 8.9–10.3)
Chloride: 90 mmol/L — ABNORMAL LOW (ref 98–111)
Creatinine, Ser: 1.11 mg/dL (ref 0.61–1.24)
GFR calc Af Amer: >60 mL/min (ref >60)
GFR calc non Af Amer: >60 mL/min (ref >60)
Glucose, Bld: 123 mg/dL — ABNORMAL HIGH (ref 70–99)
Potassium: 3.4 mmol/L — ABNORMAL LOW (ref 3.5–5.1)
Sodium: 141 mmol/L (ref 135–145)

## 2019-04-24 LAB — BRAIN NATRIURETIC PEPTIDE: B Natriuretic Peptide: 1169.3 pg/mL — ABNORMAL HIGH (ref 0.0–100.0)

## 2019-04-24 LAB — TROPONIN I (HIGH SENSITIVITY): Troponin I (High Sensitivity): 30 ng/L — ABNORMAL HIGH (ref <18)

## 2019-04-24 MED ORDER — SODIUM CHLORIDE 0.9% FLUSH: 3.0000 mL | Freq: Once | INTRAVENOUS | Status: DC

## 2019-04-24 NOTE — ED Triage Notes (Signed)
Pt c/o increased weakness and fatigue for several weeks. Pt uses 02 as needed at home, pt noted to have 02 sat 69% in triage. Pt denies shob but does appear pale. Denies pain, c/o increased swelling to lower extremities.sat up to high 80's when placed on 2L [place]. A & O.

## 2019-04-24 NOTE — ED Notes (Signed)
Pt placed on 2lpm o2 by N/C o2 up to 94%

## 2019-04-25 ENCOUNTER — Other Ambulatory Visit: Payer: Self-pay

## 2019-04-25 ENCOUNTER — Encounter (HOSPITAL_COMMUNITY): Payer: Self-pay | Admitting: Internal Medicine

## 2019-04-25 ENCOUNTER — Inpatient Hospital Stay (HOSPITAL_COMMUNITY): Payer: Self-pay

## 2019-04-25 DIAGNOSIS — I5033 Acute on chronic diastolic (congestive) heart failure: Secondary | ICD-10-CM

## 2019-04-25 DIAGNOSIS — I5032 Chronic diastolic (congestive) heart failure: Secondary | ICD-10-CM | POA: Diagnosis present

## 2019-04-25 DIAGNOSIS — R609 Edema, unspecified: Secondary | ICD-10-CM

## 2019-04-25 DIAGNOSIS — J9612 Chronic respiratory failure with hypercapnia: Secondary | ICD-10-CM | POA: Diagnosis present

## 2019-04-25 DIAGNOSIS — J9611 Chronic respiratory failure with hypoxia: Secondary | ICD-10-CM | POA: Diagnosis present

## 2019-04-25 DIAGNOSIS — J449 Chronic obstructive pulmonary disease, unspecified: Secondary | ICD-10-CM

## 2019-04-25 DIAGNOSIS — J9601 Acute respiratory failure with hypoxia: Secondary | ICD-10-CM | POA: Diagnosis present

## 2019-04-25 LAB — CBC WITH DIFFERENTIAL/PLATELET
Abs Immature Granulocytes: 0.01 10*3/uL (ref 0.00–0.07)
Basophils Absolute: 0 10*3/uL (ref 0.0–0.1)
Basophils Relative: 1 %
Eosinophils Absolute: 0.1 10*3/uL (ref 0.0–0.5)
Eosinophils Relative: 1 %
HCT: 52.9 % — ABNORMAL HIGH (ref 39.0–52.0)
Hemoglobin: 16.7 g/dL (ref 13.0–17.0)
Immature Granulocytes: 0 %
Lymphocytes Relative: 23 %
Lymphs Abs: 1.3 10*3/uL (ref 0.7–4.0)
MCH: 33.6 pg (ref 26.0–34.0)
MCHC: 31.6 g/dL (ref 30.0–36.0)
MCV: 106.4 fL — ABNORMAL HIGH (ref 80.0–100.0)
Monocytes Absolute: 0.6 10*3/uL (ref 0.1–1.0)
Monocytes Relative: 11 %
Neutro Abs: 3.8 10*3/uL (ref 1.7–7.7)
Neutrophils Relative %: 64 %
Platelets: 182 10*3/uL (ref 150–400)
RBC: 4.97 MIL/uL (ref 4.22–5.81)
RDW: 15.3 % (ref 11.5–15.5)
WBC: 5.9 10*3/uL (ref 4.0–10.5)
nRBC: 0 % (ref 0.0–0.2)

## 2019-04-25 LAB — COMPREHENSIVE METABOLIC PANEL
ALT: 40 U/L (ref 0–44)
AST: 34 U/L (ref 15–41)
Albumin: 3.5 g/dL (ref 3.5–5.0)
Alkaline Phosphatase: 54 U/L (ref 38–126)
Anion gap: 14 (ref 5–15)
BUN: 23 mg/dL — ABNORMAL HIGH (ref 6–20)
CO2: 36 mmol/L — ABNORMAL HIGH (ref 22–32)
Calcium: 8.7 mg/dL — ABNORMAL LOW (ref 8.9–10.3)
Chloride: 92 mmol/L — ABNORMAL LOW (ref 98–111)
Creatinine, Ser: 1 mg/dL (ref 0.61–1.24)
GFR calc Af Amer: >60 mL/min (ref >60)
GFR calc non Af Amer: >60 mL/min (ref >60)
Glucose, Bld: 128 mg/dL — ABNORMAL HIGH (ref 70–99)
Potassium: 3 mmol/L — ABNORMAL LOW (ref 3.5–5.1)
Sodium: 142 mmol/L (ref 135–145)
Total Bilirubin: 0.8 mg/dL (ref 0.3–1.2)
Total Protein: 6.1 g/dL — ABNORMAL LOW (ref 6.5–8.1)

## 2019-04-25 LAB — TSH: TSH: 1.757 u[IU]/mL (ref 0.350–4.500)

## 2019-04-25 LAB — MAGNESIUM: Magnesium: 1.8 mg/dL (ref 1.7–2.4)

## 2019-04-25 LAB — HIV ANTIBODY (ROUTINE TESTING W REFLEX): HIV Screen 4th Generation wRfx: NONREACTIVE

## 2019-04-25 LAB — SARS CORONAVIRUS 2 (TAT 6-24 HRS): SARS Coronavirus 2: NEGATIVE

## 2019-04-25 LAB — TROPONIN I (HIGH SENSITIVITY): Troponin I (High Sensitivity): 19 ng/L — ABNORMAL HIGH (ref <18)

## 2019-04-25 MED ORDER — FUROSEMIDE 10 MG/ML IJ SOLN
40.0000 mg | Freq: Two times a day (BID) | INTRAMUSCULAR | Status: DC
  Administered 2019-04-25 (×2): 40 mg via INTRAVENOUS
  Filled 2019-04-25 (×2): qty 4

## 2019-04-25 MED ORDER — POTASSIUM CHLORIDE CRYS ER 20 MEQ PO TBCR
20.0000 meq | EXTENDED_RELEASE_TABLET | Freq: Two times a day (BID) | ORAL | Status: DC
  Administered 2019-04-25 – 2019-04-30 (×11): 20 meq via ORAL
  Filled 2019-04-25 (×11): qty 1

## 2019-04-25 MED ORDER — ENOXAPARIN SODIUM 40 MG/0.4ML ~~LOC~~ SOLN
40.0000 mg | Freq: Every day | SUBCUTANEOUS | Status: DC
  Administered 2019-04-25 – 2019-04-29 (×5): 40 mg via SUBCUTANEOUS
  Filled 2019-04-25 (×6): qty 0.4

## 2019-04-25 MED ORDER — ACETAMINOPHEN 650 MG RE SUPP: 650.0000 mg | Freq: Four times a day (QID) | RECTAL | Status: DC | PRN

## 2019-04-25 MED ORDER — ONDANSETRON HCL 4 MG/2ML IJ SOLN: 4.0000 mg | Freq: Four times a day (QID) | INTRAMUSCULAR | Status: DC | PRN

## 2019-04-25 MED ORDER — ALBUTEROL SULFATE HFA 108 (90 BASE) MCG/ACT IN AERS
8.0000 | INHALATION_SPRAY | Freq: Once | RESPIRATORY_TRACT | Status: AC
  Administered 2019-04-25: 8 via RESPIRATORY_TRACT
  Filled 2019-04-25: qty 6.7

## 2019-04-25 MED ORDER — FAMOTIDINE 20 MG PO TABS
20.0000 mg | ORAL_TABLET | Freq: Two times a day (BID) | ORAL | Status: DC
  Administered 2019-04-25 – 2019-04-30 (×10): 20 mg via ORAL
  Filled 2019-04-25 (×11): qty 1

## 2019-04-25 MED ORDER — ALBUTEROL SULFATE (2.5 MG/3ML) 0.083% IN NEBU: 2.5000 mg | INHALATION_SOLUTION | Freq: Four times a day (QID) | RESPIRATORY_TRACT | Status: DC | PRN

## 2019-04-25 MED ORDER — FUROSEMIDE 10 MG/ML IJ SOLN
40.0000 mg | Freq: Once | INTRAMUSCULAR | Status: AC
  Administered 2019-04-25: 03:00:00 40 mg via INTRAVENOUS
  Filled 2019-04-25: qty 4

## 2019-04-25 MED ORDER — ACETAMINOPHEN 325 MG PO TABS: 650.0000 mg | ORAL_TABLET | Freq: Four times a day (QID) | ORAL | Status: DC | PRN

## 2019-04-25 MED ORDER — ONDANSETRON HCL 4 MG PO TABS: 4.0000 mg | ORAL_TABLET | Freq: Four times a day (QID) | ORAL | Status: DC | PRN

## 2019-04-25 NOTE — ED Notes (Signed)
ED TO INPATIENT HANDOFF REPORT  ED Nurse Name and Phone #: Luellen Pucker [phone]  S Name/Age/Gender Anthony Nelson 58 y.o. male Room/Bed: 027C/027C  Code Status   Code Status: Full Code  Home/SNF/Other Home Patient oriented to: self, place, time and situation Is this baseline? Yes   Triage Complete: Triage complete  Chief Complaint SOB, Leg Swelling  Triage Note Pt c/o increased weakness and fatigue for several weeks. Pt uses 02 as needed at home, pt noted to have 02 sat 69% in triage. Pt denies shob but does appear pale. Denies pain, c/o increased swelling to lower extremities.sat up to high 80's when placed on 2L [place]. A & O.    Allergies Allergies  Allergen Reactions  . Erythromycin     Level of Care/Admitting Diagnosis ED Disposition    ED Disposition Condition [facility] Area: [place] [[id number]]  Level of Care: Telemetry Cardiac [103]  Covid Evaluation: Asymptomatic Screening Protocol (No Symptoms)  Diagnosis: Acute respiratory failure with hypoxia [facility]) [[id number]]  Admitting Physician: Rise Patience [phone]  Attending Physician: Rise Patience [phone]  Estimated length of stay: past midnight tomorrow  Certification:: I certify this patient will need inpatient services for at least 2 midnights  PT Class (Do Not Modify): Inpatient [101]  PT Acc Code (Do Not Modify): Private [1]       B Medical/Surgery History Past Medical History:  Diagnosis Date  . Bronchitis   . COPD (chronic obstructive pulmonary disease) ([place])   . Diastolic heart failure ([place])   . GERD (gastroesophageal reflux disease)   . Pulmonary hypertension ([place])   . 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 ([place]) 04/10/2018  . Respiratory failure with hypoxia [facility])    Past Surgical History:  Procedure Laterality Date  . HERNIA REPAIR       A IV Location/Drains/Wounds Patient Lines/Drains/Airways Status   Active  Line/Drains/Airways    Name:   Placement date:   Placement time:   Site:   Days:   Peripheral IV 04/25/19 Left;Upper Forearm   04/25/19    0305    Forearm   less than 1          Intake/Output Last 24 hours  Intake/Output Summary (Last 24 hours) at 04/25/2019 1234 Last data filed at 04/25/2019 1033 Gross per 24 hour  Intake -  Output 2550 ml  Net -2550 ml    Labs/Imaging Results for orders placed or performed during the hospital encounter of 04/24/19 (from the past 48 hour(s))  Basic metabolic panel     Status: Abnormal   Collection Time: 04/24/19  7:52 PM  Result Value Ref Range   Sodium 141 135 - 145 mmol/L   Potassium 3.4 (L) 3.5 - 5.1 mmol/L   Chloride 90 (L) 98 - 111 mmol/L   CO2 38 (H) 22 - 32 mmol/L   Glucose, Bld 123 (H) 70 - 99 mg/dL   BUN 25 (H) 6 - 20 mg/dL   Creatinine, Ser 1.11 0.61 - 1.24 mg/dL   Calcium 9.2 8.9 - 10.3 mg/dL   GFR calc non Af Amer >60 >60 mL/min   GFR calc Af Amer >60 >60 mL/min   Anion gap 13 5 - 15    Comment: Performed at [facility], [place] [address], [place], [place] [address]  CBC     Status: Abnormal   Collection Time: 04/24/19  7:52 PM  Result Value Ref Range   WBC 5.7 4.0 -  10.5 K/uL   RBC 5.35 4.22 - 5.81 MIL/uL   Hemoglobin 18.1 (H) 13.0 - 17.0 g/dL    Comment: REPEATED TO VERIFY   HCT 56.7 (H) 39.0 - 52.0 %   MCV 106.0 (H) 80.0 - 100.0 fL   MCH 33.8 26.0 - 34.0 pg   MCHC 31.9 30.0 - 36.0 g/dL   RDW 15.5 11.5 - 15.5 %   Platelets 196 150 - 400 K/uL    Comment: REPEATED TO VERIFY   nRBC 0.0 0.0 - 0.2 %    Comment: Performed at [facility], [place] [address], [place], [place] [address]  Brain natriuretic peptide     Status: Abnormal   Collection Time: 04/24/19  7:52 PM  Result Value Ref Range   B Natriuretic Peptide 1,169.3 (H) 0.0 - 100.0 pg/mL    Comment: Performed at [place] [address], [place], [place] [address]  Troponin I (High Sensitivity)     Status: Abnormal   Collection Time: 04/24/19   7:52 PM  Result Value Ref Range   Troponin I (High Sensitivity) 30 (H) <18 ng/L    Comment: (NOTE) Elevated high sensitivity troponin I (hsTnI) values and significant  changes across serial measurements may suggest ACS but many other  chronic and acute conditions are known to elevate hsTnI results.  Refer to the "Links" section for chest pain algorithms and additional  guidance. Performed at [facility], [place] [address], [place], [place] [address]   SARS CORONAVIRUS 2 (TAT 6-24 HRS) Nasopharyngeal Nasopharyngeal Swab     Status: None   Collection Time: 04/25/19  3:26 AM   Specimen: Nasopharyngeal Swab  Result Value Ref Range   SARS Coronavirus 2 NEGATIVE NEGATIVE    Comment: (NOTE) SARS-CoV-2 target nucleic acids are NOT DETECTED. The SARS-CoV-2 RNA is generally detectable in upper and lower respiratory specimens during the acute phase of infection. Negative results do not preclude SARS-CoV-2 infection, do not rule out co-infections with other pathogens, and should not be used as the sole basis for treatment or other patient management decisions. Negative results must be combined with clinical observations, patient history, and epidemiological information. The expected result is Negative. Fact Sheet for Patients: SugarRoll.be Fact Sheet for Healthcare Providers: https://www.woods-mathews.com/ This test is not yet approved or cleared by the [place] FDA and  has been authorized for detection and/or diagnosis of SARS-CoV-2 by FDA under an Emergency Use Authorization (EUA). This EUA will remain  in effect (meaning this test can be used) for the duration of the COVID-19 declaration under Section 56 4(b)(1) of the Act, 21 U.S.C. section 360bbb-3(b)(1), unless the authorization is terminated or revoked sooner. Performed at [facility], [place] [address], [place], [place] [address]   Troponin I (High Sensitivity)     Status:  Abnormal   Collection Time: 04/25/19  5:27 AM  Result Value Ref Range   Troponin I (High Sensitivity) 19 (H) <18 ng/L    Comment: (NOTE) Elevated high sensitivity troponin I (hsTnI) values and significant  changes across serial measurements may suggest ACS but many other  chronic and acute conditions are known to elevate hsTnI results.  Refer to the "Links" section for chest pain algorithms and additional  guidance. Performed at [facility], [place] [address] [place], [place] [address]   HIV Antibody (routine testing w rflx)     Status: None   Collection Time: 04/25/19  5:27 AM  Result Value Ref Range  HIV Screen 4th Generation wRfx NON REACTIVE NON REACTIVE    Comment: Performed at [facility], [place] [address] [place], [place] [address]  Magnesium     Status: None   Collection Time: 04/25/19  5:27 AM  Result Value Ref Range   Magnesium 1.8 1.7 - 2.4 mg/dL    Comment: Performed at [place] [address], [place], [place] [address]  TSH     Status: None   Collection Time: 04/25/19  5:27 AM  Result Value Ref Range   TSH 1.757 0.350 - 4.500 uIU/mL    Comment: Performed by a 3rd Generation assay with a functional sensitivity of <=0.01 uIU/mL. Performed at [facility], [place] [address], [place], [place] [address]   Comprehensive metabolic panel     Status: Abnormal   Collection Time: 04/25/19  5:27 AM  Result Value Ref Range   Sodium 142 135 - 145 mmol/L   Potassium 3.0 (L) 3.5 - 5.1 mmol/L   Chloride 92 (L) 98 - 111 mmol/L   CO2 36 (H) 22 - 32 mmol/L   Glucose, Bld 128 (H) 70 - 99 mg/dL   BUN 23 (H) 6 - 20 mg/dL   Creatinine, Ser 1.00 0.61 - 1.24 mg/dL   Calcium 8.7 (L) 8.9 - 10.3 mg/dL   Total Protein 6.1 (L) 6.5 - 8.1 g/dL   Albumin 3.5 3.5 - 5.0 g/dL   AST 34 15 - 41 U/L   ALT 40 0 - 44 U/L   Alkaline Phosphatase 54 38 - 126 U/L   Total Bilirubin 0.8 0.3 - 1.2 mg/dL   GFR calc non Af Amer >60 >60 mL/min   GFR calc Af Amer >60 >60 mL/min    Anion gap 14 5 - 15    Comment: Performed at [facility], [place] [address] [place], [place] [address]  CBC WITH DIFFERENTIAL     Status: Abnormal   Collection Time: 04/25/19  5:27 AM  Result Value Ref Range   WBC 5.9 4.0 - 10.5 K/uL   RBC 4.97 4.22 - 5.81 MIL/uL   Hemoglobin 16.7 13.0 - 17.0 g/dL   HCT 52.9 (H) 39.0 - 52.0 %   MCV 106.4 (H) 80.0 - 100.0 fL   MCH 33.6 26.0 - 34.0 pg   MCHC 31.6 30.0 - 36.0 g/dL   RDW 15.3 11.5 - 15.5 %   Platelets 182 150 - 400 K/uL   nRBC 0.0 0.0 - 0.2 %   Neutrophils Relative % 64 %   Neutro Abs 3.8 1.7 - 7.7 K/uL   Lymphocytes Relative 23 %   Lymphs Abs 1.3 0.7 - 4.0 K/uL   Monocytes Relative 11 %   Monocytes Absolute 0.6 0.1 - 1.0 K/uL   Eosinophils Relative 1 %   Eosinophils Absolute 0.1 0.0 - 0.5 K/uL   Basophils Relative 1 %   Basophils Absolute 0.0 0.0 - 0.1 K/uL   Immature Granulocytes 0 %   Abs Immature Granulocytes 0.01 0.00 - 0.07 K/uL    Comment: Performed at [facility], [address] [place],  [address]   Dg Chest 2 View  Result Date: 04/24/2019 CLINICAL DATA:  Chest pain and fatigue.  Decreased oxygen saturation EXAM: CHEST - 2 VIEW COMPARISON:  February 08, 2018 FINDINGS: There is no evident edema or consolidation. Heart is borderline enlarged. There is prominence of the main pulmonary arteries with rapid peripheral tapering. No adenopathy. No bone lesions. IMPRESSION: Borderline  cardiomegaly with evidence of a degree of pulmonary arterial hypertension. There is no demonstrable edema or consolidation. No adenopathy appreciable. Electronically Signed   By: Lowella Grip III M.D.   On: 04/24/2019 20:18    Pending Labs Unresulted Labs (From admission, onward)    Start     Ordered   04/26/19 0500  Renal function panel  Tomorrow morning,   R     04/25/19 0817   04/26/19 0500  CBC with Differential/Platelet  Tomorrow morning,   R     04/25/19 0817   04/26/19 0500  Magnesium  Tomorrow morning,   R      04/25/19 0817          Vitals/Pain Today's Vitals   04/25/19 0900 04/25/19 1000 04/25/19 1100 04/25/19 1200  BP: 103/87 113/81 105/86 102/73  Pulse: 87 85 84 84  Resp:      Temp:      TempSrc:      SpO2: 94% 95% 92% 95%  Weight:      Height:      PainSc:        Isolation Precautions No active isolations  Medications Medications  sodium chloride flush (NS) 0.9 % injection 3 mL (3 mLs Intravenous Not Given 04/25/19 0228)  famotidine (PEPCID) tablet 20 mg (has no administration in time range)  albuterol (PROVENTIL) (2.5 MG/3ML) 0.083% nebulizer solution 2.5 mg (has no administration in time range)  acetaminophen (TYLENOL) tablet 650 mg (has no administration in time range)    Or  acetaminophen (TYLENOL) suppository 650 mg (has no administration in time range)  ondansetron (ZOFRAN) tablet 4 mg (has no administration in time range)    Or  ondansetron (ZOFRAN) injection 4 mg (has no administration in time range)  enoxaparin (LOVENOX) injection 40 mg (has no administration in time range)  furosemide (LASIX) injection 40 mg (40 mg Intravenous Given 04/25/19 0846)  potassium chloride SA (KLOR-CON) CR tablet 20 mEq (20 mEq Oral Given 04/25/19 0846)  albuterol (VENTOLIN HFA) 108 (90 Base) MCG/ACT inhaler 8 puff (8 puffs Inhalation Given 04/25/19 0305)  furosemide (LASIX) injection 40 mg (40 mg Intravenous Given 04/25/19 0305)    Mobility walks     Focused Assessments Pulmonary Assessment Handoff:  Lung sounds: Bilateral Breath Sounds: (S) Diminished L Breath Sounds: Diminished R Breath Sounds: Diminished O2 Device: (S) Nasal Cannula O2 Flow Rate (L/min): (S) 2 L/min      R Recommendations: See Admitting Provider Note  Report given to:   Additional Notes: [place]

## 2019-04-25 NOTE — H&P (Signed)
History and Physical    Anthony Nelson HFW:[id number] DOB: 1960-07-18 DOA: 04/24/2019  PCP: [name], MD  Patient coming from: Home.  Chief Complaint: Shortness of breath.  HPI: Anthony Nelson is a 58 y.o. male with history of COPD, diastolic dysfunction ongoing tobacco abuse presents to the ER with complaint of shortness of breath which has been progressive worsening over the last 2 weeks.  Patient also has noticed increasing lower extremity edema.  Patient has been taking his Lasix 20 mg p.o. daily.  Denies any fever chills chest pain productive cough.  ED Course: On exam patient has elevated JVD and bilateral lower extremity edema extending up to the thigh.  Chest x-ray shows cardiomegaly with signs of pulmonary hypertension.  EKG shows sinus rhythm with biatrial enlargement LVH and nonspecific T changes.  Labs show BNP of 1169.  Potassium 3.4.  Patient was given 40 mg IV Lasix and admitted for acute CHF.  Review of Systems: As per HPI, rest all negative.   Past Medical History:  Diagnosis Date   Bronchitis    COPD (chronic obstructive pulmonary disease) (HCC)    Diastolic heart failure (HCC)    GERD (gastroesophageal reflux disease)    Pulmonary hypertension ([place])    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 ([place]) 04/10/2018   Respiratory failure with hypoxia ([place])     Past Surgical History:  Procedure Laterality Date   HERNIA REPAIR       reports that he has been smoking cigarettes. He has a 44.00 pack-year smoking history. He has never used smokeless tobacco. He reports previous alcohol use. He reports that he does not use drugs.  Allergies  Allergen Reactions   Erythromycin     Family History  Problem Relation Age of Onset   Cancer Mother    Cancer Father    CAD Father    Heart failure Father    Diabetes Maternal Grandmother    Kidney disease Maternal Grandmother    Kidney cancer Paternal Grandmother    Heart attack  Paternal Grandfather     Prior to Admission medications   Medication Sig Start Date End Date Taking? Authorizing Provider  albuterol (PROVENTIL HFA;VENTOLIN HFA) 108 (90 Base) MCG/ACT inhaler Inhale 2 puffs into the lungs every 6 (six) hours as needed for wheezing or shortness of breath. 03/06/18   Fulp, Cammie, MD  famotidine (PEPCID) 20 MG tablet Take 1 tablet (20 mg total) by mouth 2 (two) times daily. 03/06/18   Fulp, Cammie, MD  furosemide (LASIX) 20 MG tablet Take 2 tablets (40 mg total) by mouth daily. 03/06/18 03/06/19  Fulp, Cammie, MD  Glycopyrrolate-Formoterol (BEVESPI AEROSPHERE) 9-4.8 MCG/ACT AERO Inhale 2 puffs into the lungs 2 (two) times daily. 03/18/18   Tanda Rockers, MD  OXYGEN 2lpm as needed per pt  [facility]    [provider]    Physical Exam: Constitutional: Moderately built and nourished. Vitals:   04/25/19 0245 04/25/19 0330 04/25/19 0415 04/25/19 0430  BP: (!) 115/95 116/87 114/78 114/81  Pulse: 91 94    Resp: (!) 21 16 (!) 27 (!) 26  Temp:      TempSrc:      SpO2: 93% 92%    Weight:      Height:       Eyes: Anicteric no pallor. ENMT: No discharge from the ears eyes nose or mouth current Neck: No mass or.  No neck rigidity. Respiratory: Air entry appears tight bilaterally. Cardiovascular: S1-S2 heard. Abdomen: Soft nontender  bowel sound present. Musculoskeletal: Bilateral lower extremity edema extending up to the thighs. Skin: No rash. Neurologic: Alert awake oriented to time place and person.  Moves all extremities. Psychiatric: Appears normal per normal affect.   Labs on Admission: I have personally reviewed following labs and imaging studies  CBC: Recent Labs  Lab 04/24/19 1952  WBC 5.7  HGB 18.1*  HCT 56.7*  MCV 106.0*  PLT 161   Basic Metabolic Panel: Recent Labs  Lab 04/24/19 1952  NA 141  K 3.4*  CL 90*  CO2 38*  GLUCOSE 123*  BUN 25*  CREATININE 1.11  CALCIUM 9.2   GFR: Estimated Creatinine Clearance: 59.8 mL/min (by C-G  formula based on SCr of 1.11 mg/dL). Liver Function Tests: No results for input(s): AST, ALT, ALKPHOS, BILITOT, PROT, ALBUMIN in the last 168 hours. No results for input(s): LIPASE, AMYLASE in the last 168 hours. No results for input(s): AMMONIA in the last 168 hours. Coagulation Profile: No results for input(s): INR, PROTIME in the last 168 hours. Cardiac Enzymes: No results for input(s): CKTOTAL, CKMB, CKMBINDEX, TROPONINI in the last 168 hours. BNP (last 3 results) No results for input(s): PROBNP in the last 8760 hours. HbA1C: No results for input(s): HGBA1C in the last 72 hours. CBG: No results for input(s): GLUCAP in the last 168 hours. Lipid Profile: No results for input(s): CHOL, HDL, LDLCALC, TRIG, CHOLHDL, LDLDIRECT in the last 72 hours. Thyroid Function Tests: No results for input(s): TSH, T4TOTAL, FREET4, T3FREE, THYROIDAB in the last 72 hours. Anemia Panel: No results for input(s): VITAMINB12, FOLATE, FERRITIN, TIBC, IRON, RETICCTPCT in the last 72 hours. Urine analysis:    Component Value Date/Time   COLORURINE YELLOW 02/08/2018 [place] 02/08/2018 1153   LABSPEC >1.046 (H) 02/08/2018 1153   PHURINE 6.0 02/08/2018 1153   GLUCOSEU NEGATIVE 02/08/2018 1153   HGBUR NEGATIVE 02/08/2018 [place] 02/08/2018 1153   KETONESUR NEGATIVE 02/08/2018 1153   PROTEINUR 30 (A) 02/08/2018 1153   NITRITE NEGATIVE 02/08/2018 1153   LEUKOCYTESUR NEGATIVE 02/08/2018 1153   Sepsis Labs: _0 (procalcitonin:4,lacticidven:4) )No results found for this or any previous visit (from the past 240 hour(s)).   Radiological Exams on Admission: Dg Chest 2 View  Result Date: 04/24/2019 CLINICAL DATA:  Chest pain and fatigue.  Decreased oxygen saturation EXAM: CHEST - 2 VIEW COMPARISON:  February 08, 2018 FINDINGS: There is no evident edema or consolidation. Heart is borderline enlarged. There is prominence of the main pulmonary arteries with rapid  peripheral tapering. No adenopathy. No bone lesions. IMPRESSION: Borderline cardiomegaly with evidence of a degree of pulmonary arterial hypertension. There is no demonstrable edema or consolidation. No adenopathy appreciable. Electronically Signed   By: Lowella Grip III M.D.   On: 04/24/2019 20:18    EKG: Independently reviewed.  Normal sinus rhythm with biatrial enlargement and LVH.  Nonspecific ST-T changes.  Assessment/Plan Principal Problem:   Acute on chronic diastolic CHF (congestive heart failure) (HCC) Active Problems:   COPD GOLD IV still smoking   Cor pulmonale (chronic) (HCC)   Acute respiratory failure with hypoxia (HCC)    1. Acute on chronic diastolic CHF with last EF measured in 2019 was 60 to 65% with grade 1 diastolic dysfunction with history of cor pulmonale patient has been placed on Lasix 40 mg IV every 12 may need further increase in dose depending on response.  Since patient has significant lower extremity we will check Dopplers to rule out DVT.  Closely follow  intake output metabolic panel and daily weights. 2. COPD presently air entry appears tight but not actively wheezing continue inhalers. 3. Tobacco abuse -advised about quitting. 4. 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  COVID-19 test is pending.  Given that patient has significant lower extremity edema and fluid overload with acutely shortness of breath will need more than 2 midnight stay in inpatient status.   DVT prophylaxis: Lovenox. Code Status: Full code. Family Communication: Family at the bedside. Disposition Plan: Home. Consults called: None. Admission status: Inpatient.   Rise Patience MD [facility] Pager [phone].  If 7PM-7AM, please contact night-coverage [email or web address] Password [facility]  04/25/2019, 4:47 AM

## 2019-04-25 NOTE — ED Notes (Signed)
Vital signs stable. 

## 2019-04-25 NOTE — ED Notes (Addendum)
Difficult to palpated dorsalis pedis pulses. Bilateral pulses present using ultrasound

## 2019-04-25 NOTE — ED Notes (Signed)
ED TO INPATIENT HANDOFF REPORT  ED Nurse Name and Phone #:   S Name/Age/Gender Anthony Nelson 58 y.o. male Room/Bed: 027C/027C  Code Status   Code Status: Full Code  Home/SNF/Other Home Patient oriented to: self, place, time and situation Is this baseline? No   Triage Complete: Triage complete  Chief Complaint SOB, Leg Swelling  Triage Note Pt c/o increased weakness and fatigue for several weeks. Pt uses 02 as needed at home, pt noted to have 02 sat 69% in triage. Pt denies shob but does appear pale. Denies pain, c/o increased swelling to lower extremities.sat up to high 80's when placed on 2L [place]. A & O.    Allergies Allergies  Allergen Reactions  . Erythromycin     Level of Care/Admitting Diagnosis ED Disposition    ED Disposition Condition [facility]: [place] [[id number]]  Level of Care: Telemetry Cardiac [103]  Covid Evaluation: Asymptomatic Screening Protocol (No Symptoms)  Diagnosis: Acute respiratory failure with hypoxia [facility]) [[id number]]  Admitting Physician: Rise Patience [phone]  Attending Physician: Rise Patience [phone]  Estimated length of stay: past midnight tomorrow  Certification:: I certify this patient will need inpatient services for at least 2 midnights  PT Class (Do Not Modify): Inpatient [101]  PT Acc Code (Do Not Modify): Private [1]       B Medical/Surgery History Past Medical History:  Diagnosis Date  . Bronchitis   . COPD (chronic obstructive pulmonary disease) ([place])   . Diastolic heart failure ([place])   . GERD (gastroesophageal reflux disease)   . Pulmonary hypertension ([place])   . 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 Respiratory failure with hypoxia [facility])    Past Surgical History:  Procedure Laterality Date  . HERNIA REPAIR       A IV Location/Drains/Wounds Patient Lines/Drains/Airways Status   Active Line/Drains/Airways    Name:    Placement date:   Placement time:   Site:   Days:   Peripheral IV 04/25/19 Left;Upper Forearm   04/25/19    0305    Forearm   less than 1          Intake/Output Last 24 hours  Intake/Output Summary (Last 24 hours) at 04/25/2019 1233 Last data filed at 04/25/2019 1033 Gross per 24 hour  Intake -  Output 2550 ml  Net -2550 ml    Labs/Imaging Results for orders placed or performed during the hospital encounter of 04/24/19 (from the past 48 hour(s))  Basic metabolic panel     Status: Abnormal   Collection Time: 04/24/19  7:52 PM  Result Value Ref Range   Sodium 141 135 - 145 mmol/L   Potassium 3.4 (L) 3.5 - 5.1 mmol/L   Chloride 90 (L) 98 - 111 mmol/L   CO2 38 (H) 22 - 32 mmol/L   Glucose, Bld 123 (H) 70 - 99 mg/dL   BUN 25 (H) 6 - 20 mg/dL   Creatinine, Ser 1.11 0.61 - 1.24 mg/dL   Calcium 9.2 8.9 - 10.3 mg/dL   GFR calc non Af Amer >60 >60 mL/min   GFR calc Af Amer >60 >60 mL/min   Anion gap 13 5 - 15    Comment: Performed at [facility], [place] [address], [place], [place] [address]  CBC     Status: Abnormal   Collection Time: 04/24/19  7:52 PM  Result Value Ref Range   WBC 5.7 4.0 - 10.5  K/uL   RBC 5.35 4.22 - 5.81 MIL/uL   Hemoglobin 18.1 (H) 13.0 - 17.0 g/dL    Comment: REPEATED TO VERIFY   HCT 56.7 (H) 39.0 - 52.0 %   MCV 106.0 (H) 80.0 - 100.0 fL   MCH 33.8 26.0 - 34.0 pg   MCHC 31.9 30.0 - 36.0 g/dL   RDW 15.5 11.5 - 15.5 %   Platelets 196 150 - 400 K/uL    Comment: REPEATED TO VERIFY   nRBC 0.0 0.0 - 0.2 %    Comment: Performed at [facility], [place] [address], [place], [place] [address]  Brain natriuretic peptide     Status: Abnormal   Collection Time: 04/24/19  7:52 PM  Result Value Ref Range   B Natriuretic Peptide 1,169.3 (H) 0.0 - 100.0 pg/mL    Comment: Performed at [facility] [address], [place], [place] [address]  Troponin I (High Sensitivity)     Status: Abnormal   Collection Time: 04/24/19  7:52 PM  Result Value Ref Range    Troponin I (High Sensitivity) 30 (H) <18 ng/L    Comment: (NOTE) Elevated high sensitivity troponin I (hsTnI) values and significant  changes across serial measurements may suggest ACS but many other  chronic and acute conditions are known to elevate hsTnI results.  Refer to the "Links" section for chest pain algorithms and additional  guidance. Performed at [facility], [place] [address], [place], [place] [address]   SARS CORONAVIRUS 2 (TAT 6-24 HRS) Nasopharyngeal Nasopharyngeal Swab     Status: None   Collection Time: 04/25/19  3:26 AM   Specimen: Nasopharyngeal Swab  Result Value Ref Range   SARS Coronavirus 2 NEGATIVE NEGATIVE    Comment: (NOTE) SARS-CoV-2 target nucleic acids are NOT DETECTED. The SARS-CoV-2 RNA is generally detectable in upper and lower respiratory specimens during the acute phase of infection. Negative results do not preclude SARS-CoV-2 infection, do not rule out co-infections with other pathogens, and should not be used as the sole basis for treatment or other patient management decisions. Negative results must be combined with clinical observations, patient history, and epidemiological information. The expected result is Negative. Fact Sheet for Patients: SugarRoll.be Fact Sheet for Healthcare Providers: https://www.woods-mathews.com/ This test is not yet approved or cleared by the [place] FDA and  has been authorized for detection and/or diagnosis of SARS-CoV-2 by FDA under an Emergency Use Authorization (EUA). This EUA will remain  in effect (meaning this test can be used) for the duration of the COVID-19 declaration under Section 56 4(b)(1) of the Act, 21 U.S.C. section 360bbb-3(b)(1), unless the authorization is terminated or revoked sooner. Performed at [facility], [place] [address], [place], [place] [address]   Troponin I (High Sensitivity)     Status: Abnormal   Collection Time:  04/25/19  5:27 AM  Result Value Ref Range   Troponin I (High Sensitivity) 19 (H) <18 ng/L    Comment: (NOTE) Elevated high sensitivity troponin I (hsTnI) values and significant  changes across serial measurements may suggest ACS but many other  chronic and acute conditions are known to elevate hsTnI results.  Refer to the "Links" section for chest pain algorithms and additional  guidance. Performed at [facility], [place] [address], [place], [place] [address]   HIV Antibody (routine testing w rflx)     Status: None   Collection Time: 04/25/19  5:27 AM  Result Value Ref Range  HIV Screen 4th Generation wRfx NON REACTIVE NON REACTIVE    Comment: Performed at [facility], [place] [address] [place], [place] [address]  Magnesium     Status: None   Collection Time: 04/25/19  5:27 AM  Result Value Ref Range   Magnesium 1.8 1.7 - 2.4 mg/dL    Comment: Performed at [facility] [address] [place], [place] [address]  TSH     Status: None   Collection Time: 04/25/19  5:27 AM  Result Value Ref Range   TSH 1.757 0.350 - 4.500 uIU/mL    Comment: Performed by a 3rd Generation assay with a functional sensitivity of <=0.01 uIU/mL. Performed at [facility], [place] [address] [place], [place] [address]   Comprehensive metabolic panel     Status: Abnormal   Collection Time: 04/25/19  5:27 AM  Result Value Ref Range   Sodium 142 135 - 145 mmol/L   Potassium 3.0 (L) 3.5 - 5.1 mmol/L   Chloride 92 (L) 98 - 111 mmol/L   CO2 36 (H) 22 - 32 mmol/L   Glucose, Bld 128 (H) 70 - 99 mg/dL   BUN 23 (H) 6 - 20 mg/dL   Creatinine, Ser 1.00 0.61 - 1.24 mg/dL   Calcium 8.7 (L) 8.9 - 10.3 mg/dL   Total Protein 6.1 (L) 6.5 - 8.1 g/dL   Albumin 3.5 3.5 - 5.0 g/dL   AST 34 15 - 41 U/L   ALT 40 0 - 44 U/L   Alkaline Phosphatase 54 38 - 126 U/L   Total Bilirubin 0.8 0.3 - 1.2 mg/dL   GFR calc non Af Amer >60 >60 mL/min   GFR calc Af Amer >60 >60 mL/min   Anion gap 14 5 - 15     Comment: Performed at [facility], [place] [address] [place], [place] [address]  CBC WITH DIFFERENTIAL     Status: Abnormal   Collection Time: 04/25/19  5:27 AM  Result Value Ref Range   WBC 5.9 4.0 - 10.5 K/uL   RBC 4.97 4.22 - 5.81 MIL/uL   Hemoglobin 16.7 13.0 - 17.0 g/dL   HCT 52.9 (H) 39.0 - 52.0 %   MCV 106.4 (H) 80.0 - 100.0 fL   MCH 33.6 26.0 - 34.0 pg   MCHC 31.6 30.0 - 36.0 g/dL   RDW 15.3 11.5 - 15.5 %   Platelets 182 150 - 400 K/uL   nRBC 0.0 0.0 - 0.2 %   Neutrophils Relative % 64 %   Neutro Abs 3.8 1.7 - 7.7 K/uL   Lymphocytes Relative 23 %   Lymphs Abs 1.3 0.7 - 4.0 K/uL   Monocytes Relative 11 %   Monocytes Absolute 0.6 0.1 - 1.0 K/uL   Eosinophils Relative 1 %   Eosinophils Absolute 0.1 0.0 - 0.5 K/uL   Basophils Relative 1 %   Basophils Absolute 0.0 0.0 - 0.1 K/uL   Immature Granulocytes 0 %   Abs Immature Granulocytes 0.01 0.00 - 0.07 K/uL    Comment: Performed at [facility], [address], [place], [place] [address]   Dg Chest 2 View  Result Date: 04/24/2019 CLINICAL DATA:  Chest pain and fatigue.  Decreased oxygen saturation EXAM: CHEST - 2 VIEW COMPARISON:  February 08, 2018 FINDINGS: There is no evident edema or consolidation. Heart is borderline enlarged. There is prominence of the main pulmonary arteries with rapid peripheral tapering. No adenopathy. No bone lesions. IMPRESSION: Borderline  cardiomegaly with evidence of a degree of pulmonary arterial hypertension. There is no demonstrable edema or consolidation. No adenopathy appreciable. Electronically Signed   By: Lowella Grip III M.D.   On: 04/24/2019 20:18    Pending Labs Unresulted Labs (From admission, onward)    Start     Ordered   04/26/19 0500  Renal function panel  Tomorrow morning,   R     04/25/19 0817   04/26/19 0500  CBC with Differential/Platelet  Tomorrow morning,   R     04/25/19 0817   04/26/19 0500  Magnesium  Tomorrow morning,   R     04/25/19 0817           Vitals/Pain Today's Vitals   04/25/19 0900 04/25/19 1000 04/25/19 1100 04/25/19 1200  BP: 103/87 113/81 105/86 102/73  Pulse: 87 85 84 84  Resp:      Temp:      TempSrc:      SpO2: 94% 95% 92% 95%  Weight:      Height:      PainSc:        Isolation Precautions No active isolations  Medications Medications  sodium chloride flush (NS) 0.9 % injection 3 mL (3 mLs Intravenous Not Given 04/25/19 0228)  famotidine (PEPCID) tablet 20 mg (has no administration in time range)  albuterol (PROVENTIL) (2.5 MG/3ML) 0.083% nebulizer solution 2.5 mg (has no administration in time range)  acetaminophen (TYLENOL) tablet 650 mg (has no administration in time range)    Or  acetaminophen (TYLENOL) suppository 650 mg (has no administration in time range)  ondansetron (ZOFRAN) tablet 4 mg (has no administration in time range)    Or  ondansetron (ZOFRAN) injection 4 mg (has no administration in time range)  enoxaparin (LOVENOX) injection 40 mg (has no administration in time range)  furosemide (LASIX) injection 40 mg (40 mg Intravenous Given 04/25/19 0846)  potassium chloride SA (KLOR-CON) CR tablet 20 mEq (20 mEq Oral Given 04/25/19 0846)  albuterol (VENTOLIN HFA) 108 (90 Base) MCG/ACT inhaler 8 puff (8 puffs Inhalation Given 04/25/19 0305)  furosemide (LASIX) injection 40 mg (40 mg Intravenous Given 04/25/19 0305)    Mobility walks     Focused Assessments Pulmonary Assessment Handoff:  Lung sounds: Bilateral Breath Sounds: (S) Diminished L Breath Sounds: Diminished R Breath Sounds: Diminished O2 Device: (S) Nasal Cannula O2 Flow Rate (L/min): (S) 2 L/min      R Recommendations: See Admitting Provider Note  Report given to:   Additional Notes:

## 2019-04-25 NOTE — ED Provider Notes (Signed)
TIME SEEN: 2:27 AM  CHIEF COMPLAINT: Shortness of breath, bilateral lower extremity swelling  HPI: Patient is a 58 year old male with history of grade 1 diastolic dysfunction, COPD on home oxygen intermittently who presents to the emergency department with 2 weeks of increasing bilateral lower extremity swelling, shortness of breath.  No chest pain or chest discomfort.  Patient denies fevers or cough.  States he ran out of his COPD medications about 6 months ago.  He has been unable to get in touch with his PCP at [facility] for refills.  He is on Lasix 40 mg daily and reports compliance.  His significant other became concerned when he appeared to be more short of breath and his legs were more swollen.  Echo August 2019:  Study Conclusions  - Left ventricle: The cavity size was normal. Wall thickness was   increased in a pattern of mild LVH. Systolic function was normal.   The estimated ejection fraction was in the range of 60% to 65%.   Wall motion was normal; there were no regional wall motion   abnormalities. Doppler parameters are consistent with abnormal   left ventricular relaxation (grade 1 diastolic dysfunction). - Ventricular septum: The contour showed diastolic flattening and   systolic flattening. - Right ventricle: The cavity size was moderately dilated. Systolic   function was moderately to severely reduced. - Right atrium: The atrium was mildly dilated. - Tricuspid valve: There was moderate regurgitation. - Pulmonary arteries: Systolic pressure was severely increased. PA   peak pressure: 80 mm Hg (S). - Pericardium, extracardiac: A trivial pericardial effusion was   identified.  ROS: See HPI Constitutional: no fever  Eyes: no drainage  ENT: no runny nose   Cardiovascular:  no chest pain  Resp: SOB  GI: no vomiting GU: no dysuria Integumentary: no rash  Allergy: no hives  Musculoskeletal: no leg swelling  Neurological: no slurred speech ROS otherwise  negative  PAST MEDICAL HISTORY/PAST SURGICAL HISTORY:  Past Medical History:  Diagnosis Date  . Bronchitis   . COPD (chronic obstructive pulmonary disease) ([place])   . Diastolic heart failure ([place])   . GERD (gastroesophageal reflux disease)   . Pulmonary hypertension ([place])   . 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 Respiratory failure with hypoxia ([facility])     MEDICATIONS:  Prior to Admission medications   Medication Sig Start Date End Date Taking? Authorizing Provider  albuterol (PROVENTIL HFA;VENTOLIN HFA) 108 (90 Base) MCG/ACT inhaler Inhale 2 puffs into the lungs every 6 (six) hours as needed for wheezing or shortness of breath. 03/06/18   Fulp, Cammie, MD  famotidine (PEPCID) 20 MG tablet Take 1 tablet (20 mg total) by mouth 2 (two) times daily. 03/06/18   Fulp, Cammie, MD  furosemide (LASIX) 20 MG tablet Take 2 tablets (40 mg total) by mouth daily. 03/06/18 03/06/19  Fulp, Cammie, MD  Glycopyrrolate-Formoterol (BEVESPI AEROSPHERE) 9-4.8 MCG/ACT AERO Inhale 2 puffs into the lungs 2 (two) times daily. 03/18/18   Tanda Rockers, MD  OXYGEN 2lpm as needed per pt  [facility]    [provider]    ALLERGIES:  Allergies  Allergen Reactions  . Erythromycin     SOCIAL HISTORY:  Social History   Tobacco Use  . Smoking status: Current Every Day Smoker    Packs/day: 1.00    Years: 44.00    Pack years: 44.00    Types: Cigarettes  . Smokeless tobacco: Never Used  Substance Use Topics  .  Alcohol use: Not Currently    Comment: Socially     FAMILY HISTORY: Family History  Problem Relation Age of Onset  . Cancer Mother   . Cancer Father   . CAD Father   . Heart failure Father   . Diabetes Maternal Grandmother   . Kidney disease Maternal Grandmother   . Kidney cancer Paternal Grandmother   . Heart attack Paternal Grandfather     EXAM: BP 114/85 (BP Location: Right Arm)   Pulse 98   Temp 98.4 F (36.9 C) (Oral) Comment:  Simultaneous filing. User may not have seen previous data. Comment (Src): Simultaneous filing. User may not have seen previous data.  Resp 19   Ht 6' (1.829 m)   Wt 59 kg   SpO2 100%   BMI 17.64 kg/m  CONSTITUTIONAL: Alert and oriented and responds appropriately to questions.  Chronically ill-appearing HEAD: Normocephalic EYES: Conjunctivae clear, pupils appear equal, EOMI ENT: normal nose; moist mucous membranes NECK: Supple, no meningismus, no nuchal rigidity, no LAD, JVD present to the mandible when sitting upright in bed CARD: RRR; S1 and S2 appreciated; no murmurs, no clicks, no rubs, no gallops RESP: Patient has some mild tachypnea.  No hypoxia on 2 L nasal cannula.  No wheezing.  No rhonchi.  Patient has minimal bibasilar rales on exam but is mostly diminished throughout.  No respiratory distress. ABD/GI: Normal bowel sounds; non-distended; soft, non-tender, no rebound, no guarding, no peritoneal signs, no hepatosplenomegaly BACK:  The back appears normal and is non-tender to palpation, there is no CVA tenderness EXT: Normal ROM in all joints; non-tender to palpation; bilateral 2+ pitting edema in his lower extremities; normal capillary refill; no cyanosis, no calf tenderness or swelling    SKIN: Normal color for age and race; warm; no rash NEURO: Moves all extremities equally PSYCH: The patient's mood and manner are appropriate. Grooming and personal hygiene are appropriate.  MEDICAL DECISION MAKING: Patient here with what I suspect is a CHF exacerbation.  He has JVD, peripheral edema and bibasilar rales on exam.  His BNP is almost 1200.  He denies having chest pain.  Troponin is 30.  EKG shows no new ischemic change.  Chest x-ray shows cardiomegaly with pulmonary arterial hypertension but no edema or consolidation.  I suspect that he clinically does have pulmonary edema and I have recommended admission for IV diuresis and patient and wife agree.  We will also provide him with albuterol  inhaler to use as needed.  I suspect this is more likely CHF than COPD.  Will discuss with hospitalist for admission.  ED PROGRESS:   3:33 AM Discussed patient's case with hospitalist, Dr. Hal Hope.  I have recommended admission and patient (and family if present) agree with this plan. Admitting physician will place admission orders.   I reviewed all nursing notes, vitals, pertinent previous records, EKGs, lab and urine results, imaging (as available).    Anthony Nelson was evaluated in Emergency Department on 04/25/2019 for the symptoms described in the history of present illness. He was evaluated in the context of the global COVID-19 pandemic, which necessitated consideration that the patient might be at risk for infection with the SARS-CoV-2 virus that causes COVID-19. Institutional protocols and algorithms that pertain to the evaluation of patients at risk for COVID-19 are in a state of rapid change based on information released by regulatory bodies including the CDC and federal and state organizations. These policies and algorithms were followed during the patient's care in the ED.  EKG Interpretation  Date/Time:  Saturday April 25 2019 02:32:59 EDT Ventricular Rate:  96 PR Interval:    QRS Duration: 98 QT Interval:  363 QTC Calculation: 459 R Axis:   136 Text Interpretation:  Sinus rhythm Biatrial enlargement Right ventricular hypertrophy Borderline T abnormalities, anterior leads Borderline ST elevation, anterolateral leads No significant change since last tracing Confirmed by Lanett Lasorsa, Cyril Mourning [phone]) on 04/25/2019 2:34:37 AM         Natashia Roseman, Delice Bison, DO 04/25/19 9675

## 2019-04-25 NOTE — Progress Notes (Signed)
Bilateral lower extremity venous duplex completed. Refer to "CV Proc" under chart review to view preliminary results.  04/25/2019 4:00 PM Maudry Mayhew, MHA, RVT, RDCS, RDMS

## 2019-04-25 NOTE — ED Notes (Signed)
Tele ordered bfast

## 2019-04-25 NOTE — Progress Notes (Signed)
Hospitalist progress note  If 7PM-7AM,  night-coverage-look on AMION -prefer pages-not epic chat,please  Anthony Nelson  OIN:[id number] DOB: 1960/09/29 DOA: 04/24/2019 PCP: [name], MD  Narrative:  43 male former smoker severe pulmonary group 3 HTN EF 60-65% dilated RV PASP 80-follows with Dr. 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 daily COPD smoker Still smokes 3 to 4 cigarettes a day but is somewhat elusive about actual amount Continue cessation efforts, no wheeze today therefore as needed albuterol-continue 2 L of oxygen-he states he uses mainly as as needed HTN Stable monitor trends in house Prior transaminitis Check in a.m. lab monitoring for Hypokalemia Replacing with potassium 20 twice daily likely secondary to diuresis Repeat labs including magnesium Secondary polycythemia Probably from smoking  DVT prophylaxis: Lovenox  Code Status:   Full code presumed   Family Communication:   Discussed briefly with family and everyone was sleepy f  Disposition Plan: Inpatient None yet Consultants:   None yet Procedures:   No Antimicrobials:   No Subjective: Awake alert coherent No pain Has passed some urine since 910 chest pain Chest pain Abdominal pain does have lower extremity Objective: Vitals:   04/25/19 0430 04/25/19 0500 04/25/19 0600 04/25/19 0700  BP: 114/81 117/85 122/83 105/79  Pulse:  98 91 80  Resp: (!) 26  (!) 24   Temp:      TempSrc:      SpO2:  94% (!) 86% 95%  Weight:       Height:        Intake/Output Summary (Last 24 hours) at 04/25/2019 0729 Last data filed at 04/25/2019 0437 Gross per 24 hour  Intake -  Output 1350 ml  Net -1350 ml   Filed Weights   04/24/19 2004  Weight: 59 kg    Examination: EOMI NCAT slight build JVD elevated to neck Grade 2-3 lower extremity edema S1-S2 displaced PMI Abdomen soft no rebound no guarding Neurologically intact although a little bit sleepy  Data Reviewed: I have personally reviewed following labs and imaging studies CBC: Recent Labs  Lab 04/24/19 1952 04/25/19 0527  WBC 5.7 5.9  NEUTROABS  --  3.8  HGB 18.1* 16.7  HCT 56.7* 52.9*  MCV 106.0* 106.4*  PLT 196 709   Basic Metabolic Panel: Recent Labs  Lab 04/24/19 1952 04/25/19 0527  NA 141 142  K 3.4* 3.0*  CL 90* 92*  CO2 38* 36*  GLUCOSE 123* 128*  BUN 25* 23*  CREATININE 1.11 1.00  CALCIUM 9.2 8.7*  MG  --  1.8   GFR: Estimated Creatinine Clearance: 66.4 mL/min (by C-G formula based on SCr of 1 mg/dL). Liver Function Tests: Recent Labs  Lab 04/25/19 0527  AST 34  ALT 40  ALKPHOS 54  BILITOT 0.8  PROT 6.1*  ALBUMIN 3.5   No results for input(s): LIPASE, AMYLASE in the last 168 hours. No results for input(s): AMMONIA in the last 168 hours. Coagulation Profile: No results for input(s): INR, PROTIME in the last 168 hours. Cardiac Enzymes: Radiology Studies: Reviewed images personally in health database  Scheduled Meds: . enoxaparin (LOVENOX)  injection  40 mg Subcutaneous Daily  . famotidine  20 mg Oral BID  . furosemide  40 mg Intravenous BID  . potassium chloride  20 mEq Oral BID  . sodium chloride flush  3 mL Intravenous Once   Continuous Infusions:  LOS: 0 days   Time spent: Adrian, MD [facility]  04/25/2019, 7:29 AM

## 2019-04-26 LAB — CBC WITH DIFFERENTIAL/PLATELET
Abs Immature Granulocytes: 0.02 10*3/uL (ref 0.00–0.07)
Basophils Absolute: 0 10*3/uL (ref 0.0–0.1)
Basophils Relative: 0 %
Eosinophils Absolute: 0.1 10*3/uL (ref 0.0–0.5)
Eosinophils Relative: 1 %
HCT: 48.6 % (ref 39.0–52.0)
Hemoglobin: 15.3 g/dL (ref 13.0–17.0)
Immature Granulocytes: 0 %
Lymphocytes Relative: 18 %
Lymphs Abs: 0.9 10*3/uL (ref 0.7–4.0)
MCH: 34 pg (ref 26.0–34.0)
MCHC: 31.5 g/dL (ref 30.0–36.0)
MCV: 108 fL — ABNORMAL HIGH (ref 80.0–100.0)
Monocytes Absolute: 0.5 10*3/uL (ref 0.1–1.0)
Monocytes Relative: 9 %
Neutro Abs: 3.4 10*3/uL (ref 1.7–7.7)
Neutrophils Relative %: 72 %
Platelets: 173 10*3/uL (ref 150–400)
RBC: 4.5 MIL/uL (ref 4.22–5.81)
RDW: 14.7 % (ref 11.5–15.5)
WBC: 4.8 10*3/uL (ref 4.0–10.5)
nRBC: 0 % (ref 0.0–0.2)

## 2019-04-26 LAB — RENAL FUNCTION PANEL
Albumin: 3 g/dL — ABNORMAL LOW (ref 3.5–5.0)
Anion gap: 7 (ref 5–15)
BUN: 17 mg/dL (ref 6–20)
CO2: 49 mmol/L — ABNORMAL HIGH (ref 22–32)
Calcium: 8.2 mg/dL — ABNORMAL LOW (ref 8.9–10.3)
Chloride: 88 mmol/L — ABNORMAL LOW (ref 98–111)
Creatinine, Ser: 0.82 mg/dL (ref 0.61–1.24)
GFR calc Af Amer: >60 mL/min (ref >60)
GFR calc non Af Amer: >60 mL/min (ref >60)
Glucose, Bld: 94 mg/dL (ref 70–99)
Phosphorus: 3.8 mg/dL (ref 2.5–4.6)
Potassium: 3.3 mmol/L — ABNORMAL LOW (ref 3.5–5.1)
Sodium: 144 mmol/L (ref 135–145)

## 2019-04-26 LAB — MAGNESIUM: Magnesium: 1.8 mg/dL (ref 1.7–2.4)

## 2019-04-26 MED ORDER — FUROSEMIDE 10 MG/ML IJ SOLN
40.0000 mg | Freq: Every day | INTRAMUSCULAR | Status: DC
  Administered 2019-04-26 – 2019-04-29 (×4): 40 mg via INTRAVENOUS
  Filled 2019-04-26 (×4): qty 4

## 2019-04-26 MED ORDER — POLYETHYLENE GLYCOL 3350 17 G PO PACK
17.0000 g | PACK | Freq: Every day | ORAL | Status: DC
  Administered 2019-04-26 – 2019-04-28 (×3): 17 g via ORAL
  Filled 2019-04-26 (×4): qty 1

## 2019-04-26 MED ORDER — ALUM & MAG HYDROXIDE-SIMETH 200-200-20 MG/5ML PO SUSP
30.0000 mL | ORAL | Status: DC | PRN
  Administered 2019-04-26: 30 mL via ORAL
  Filled 2019-04-26: qty 30

## 2019-04-26 NOTE — Progress Notes (Signed)
Hospitalist progress note  If 7PM-7AM,  night-coverage-look on AMION -prefer pages-not epic chat,please  Anthony Nelson  KPT:[id number] DOB: 03-Mar-1961 DOA: 04/24/2019 PCP: [name], MD  Narrative:  40 male former smoker severe pulmonary group 3 HTN EF 60-65% dilated RV PASP 80-follows with Dr. Oval Linsey cardiology, tobacco abuse with COPD/emphysema, transaminitis secondary to congestive hepatopathy--admit to [facility] 10/24 AM by my partner 2 weeks lower extremity edema shortness of breath-CXR = cardiomegaly EKG biatrial enlargement BNP 1100   Assessment & Plan: Acute diastolic heart failure/pulmonary hypertension 2/2 noncompliance IV Lasix changed to once daily secondary to metabolic alkalosis and mild hypotension, already feeling better less short of breath Desat screen-still has JVD so expect will be several days prior to transition to oral Lasix Lower extremity Dopplers 10/25 are negative for DVT COPD smoker Still smokes 3 to 4 cigarettes a day but is somewhat elusive about actual amount HTN with complications of hypotension See above-not on antihypertensives-monitor Prior transaminitis LFTs seems to have resolved Hypokalemia Improved 3.0-->3.3 magnesium 1.8-continue replacement Secondary polycythemia Probably from smoking  DVT prophylaxis: Lovenox  Code Status:   Full code presumed   Family Communication:   Discussed with patient alone Disposition Plan: Inpatient None yet Consultants:   None yet Procedures:   No Antimicrobials:   No Subjective: Overall improved - S OB - F- NVD - blurred vision - unilateral weakness  Objective: Vitals:   04/26/19 0035 04/26/19 0650 04/26/19 0839 04/26/19 1257  BP: 98/70 (!) 86/61 103/73 94/75  Pulse: 86 84 82 79  Resp: _0 Temp: 98.1 F (36.7 C) 98.1 F (36.7 C)  97.6 F (36.4 C)  TempSrc: Oral Oral  Oral  SpO2: (!) 87% 95% 98% 98%  Weight:      Height:        Intake/Output Summary (Last 24 hours) at 04/26/2019  1328 Last data filed at 04/26/2019 1257 Gross per 24 hour  Intake 720 ml  Output 2050 ml  Net -1330 ml   Filed Weights   04/24/19 2004 04/25/19 1326  Weight: 59 kg 53.3 kg    Examination:  Coherent no distress EOMI NCAT Chest clear no added sound Abdomen soft no rebound no guarding 2+ lower extremity edema Neurologically intact JVD present at bedside  Data Reviewed: I have personally reviewed following labs and imaging studies CBC: Recent Labs  Lab 04/24/19 1952 04/25/19 0527 04/26/19 0522  WBC 5.7 5.9 4.8  NEUTROABS  --  3.8 3.4  HGB 18.1* 16.7 15.3  HCT 56.7* 52.9* 48.6  MCV 106.0* 106.4* 108.0*  PLT 196 182 170   Basic Metabolic Panel: Recent Labs  Lab 04/24/19 1952 04/25/19 0527 04/26/19 0522  NA 141 142 144  K 3.4* 3.0* 3.3*  CL 90* 92* 88*  CO2 38* 36* 49*  GLUCOSE 123* 128* 94  BUN 25* 23* 17  CREATININE 1.11 1.00 0.82  CALCIUM 9.2 8.7* 8.2*  MG  --  1.8 1.8  PHOS  --   --  3.8   GFR: Estimated Creatinine Clearance: 73.1 mL/min (by C-G formula based on SCr of 0.82 mg/dL). Liver Function Tests: Recent Labs  Lab 04/25/19 0527 04/26/19 0522  AST 34  --   ALT 40  --   ALKPHOS 54  --   BILITOT 0.8  --   PROT 6.1*  --   ALBUMIN 3.5 3.0*   No results for input(s): LIPASE, AMYLASE in the last 168 hours. No results for input(s): AMMONIA in the last 168 hours.  Coagulation Profile: No results for input(s): INR, PROTIME in the last 168 hours. Cardiac Enzymes: Radiology Studies: Reviewed images personally in health database  Scheduled Meds: . enoxaparin (LOVENOX) injection  40 mg Subcutaneous Daily  . famotidine  20 mg Oral BID  . furosemide  40 mg Intravenous Daily  . potassium chloride  20 mEq Oral BID  . sodium chloride flush  3 mL Intravenous Once   Continuous Infusions:  LOS: 1 day   Time spent: [place], MD [facility]  04/26/2019, 1:28 PM

## 2019-04-26 NOTE — Progress Notes (Signed)
Pt states he has not had a BM since admission  Pt states he takes colace at home  Messaged MD to inform via amnion

## 2019-04-26 NOTE — Progress Notes (Signed)
Pt called to state he was short of breath. When arrived to room pt was working to breath and sats in the 70's. Increased O2 to 3 liters and pt's breathing relaxed and sats returned to the 90's. Will continue to monitor.

## 2019-04-27 LAB — RENAL FUNCTION PANEL
Albumin: 3 g/dL — ABNORMAL LOW (ref 3.5–5.0)
Anion gap: 10 (ref 5–15)
BUN: 17 mg/dL (ref 6–20)
CO2: 43 mmol/L — ABNORMAL HIGH (ref 22–32)
Calcium: 8.5 mg/dL — ABNORMAL LOW (ref 8.9–10.3)
Chloride: 88 mmol/L — ABNORMAL LOW (ref 98–111)
Creatinine, Ser: 0.86 mg/dL (ref 0.61–1.24)
GFR calc Af Amer: >60 mL/min (ref >60)
GFR calc non Af Amer: >60 mL/min (ref >60)
Glucose, Bld: 97 mg/dL (ref 70–99)
Phosphorus: 3.9 mg/dL (ref 2.5–4.6)
Potassium: 4.4 mmol/L (ref 3.5–5.1)
Sodium: 141 mmol/L (ref 135–145)

## 2019-04-27 LAB — MAGNESIUM: Magnesium: 2.1 mg/dL (ref 1.7–2.4)

## 2019-04-27 NOTE — Progress Notes (Signed)
Hospitalist progress note  If 7PM-7AM,  night-coverage-look on AMION -prefer pages-not epic chat,please  Anthony Nelson  VCB:[id number] DOB: May 14, 1961 DOA: 04/24/2019 PCP: [name], MD  Narrative:  80 male former smoker severe pulmonary group 3 HTN EF 60-65% dilated RV PASP 80-follows with Dr. Oval Linsey cardiology, tobacco abuse with COPD/emphysema, transaminitis secondary to congestive hepatopathy--admit to [facility] 10/24 AM by my partner 2 weeks lower extremity edema shortness of breath-CXR = cardiomegaly EKG biatrial enlargement BNP 1100   Assessment & Plan: Acute diastolic heart failure/pulmonary hypertension 2/2 noncompliance IV Lasix 40 mg expect slow process-needs to stay on fluid restriction need to see negative balance  hypoxic earlier today monitor trends Alkalosis better but still present Lower extremity Dopplers 10/25 are negative for DVT COPD smoker Still smokes 3 to 4 cigarettes a day cessation counseling done  HTN with complications of hypotension See above-not on antihypertensives-monitor Prior transaminitis LFTs seems to have resolved Hypokalemia Improved 4.4 magnesium two-point 1 AM trend Secondary polycythemia Probably from smoking  DVT prophylaxis: Lovenox  Code Status:   Full code presumed   Family Communication:   Discussed with patient and wife Disposition Plan: Inpatient and not ready for discharge yet secondary to need for further diuresis None yet Consultants:   None yet Procedures:   No Antimicrobials:   No Subjective:  Has not been compliant on 1200 cc restriction, tolerating diet-had episodic shortness of breath earlier today No chest Swelling seem to little less Not been out of bed Wife came into her room and she understands plan  Objective: Vitals:   04/26/19 1257 04/26/19 2100 04/27/19 0500 04/27/19 0600  BP: 94/75 104/76  99/69  Pulse: 79 90  74  Resp: 15     Temp: 97.6 F (36.4 C) (!) 97.5 F (36.4 C)  97.8 F (36.6 C)   TempSrc: Oral Axillary  Oral  SpO2: 98% 98%  100%  Weight:   55.5 kg   Height:        Intake/Output Summary (Last 24 hours) at 04/27/2019 1002 Last data filed at 04/26/2019 2100 Gross per 24 hour  Intake 582 ml  Output 800 ml  Net -218 ml   Filed Weights   04/24/19 2004 04/25/19 1326 04/27/19 0500  Weight: 59 kg 53.3 kg 55.5 kg    Examination:  Coherent mild JVD EOMI NCAT Chest clear no added sound no rales no rhonchi Abdomen soft no rebound no guarding 1+ lower extremity edema Neurologically intact JVD present at bedside  Data Reviewed: I have personally reviewed following labs and imaging studies CBC: Recent Labs  Lab 04/24/19 1952 04/25/19 0527 04/26/19 0522  WBC 5.7 5.9 4.8  NEUTROABS  --  3.8 3.4  HGB 18.1* 16.7 15.3  HCT 56.7* 52.9* 48.6  MCV 106.0* 106.4* 108.0*  PLT 196 182 384   Basic Metabolic Panel: Recent Labs  Lab 04/24/19 1952 04/25/19 0527 04/26/19 0522 04/27/19 0741  NA 141 142 144 141  K 3.4* 3.0* 3.3* 4.4  CL 90* 92* 88* 88*  CO2 38* 36* 49* 43*  GLUCOSE 123* 128* 94 97  BUN 25* 23* 17 17  CREATININE 1.11 1.00 0.82 0.86  CALCIUM 9.2 8.7* 8.2* 8.5*  MG  --  1.8 1.8 2.1  PHOS  --   --  3.8 3.9   GFR: Estimated Creatinine Clearance: 72.6 mL/min (by C-G formula based on SCr of 0.86 mg/dL). Liver Function Tests: Recent Labs  Lab 04/25/19 0527 04/26/19 0522 04/27/19 0741  AST 34  --   --  ALT 40  --   --   ALKPHOS 54  --   --   BILITOT 0.8  --   --   PROT 6.1*  --   --   ALBUMIN 3.5 3.0* 3.0*   No results for input(s): LIPASE, AMYLASE in the last 168 hours. No results for input(s): AMMONIA in the last 168 hours. Coagulation Profile: No results for input(s): INR, PROTIME in the last 168 hours. Cardiac Enzymes: Radiology Studies: Reviewed images personally in health database  Scheduled Meds: . enoxaparin (LOVENOX) injection  40 mg Subcutaneous Daily  . famotidine  20 mg Oral BID  . furosemide  40 mg Intravenous Daily   . polyethylene glycol  17 g Oral Daily  . potassium chloride  20 mEq Oral BID  . sodium chloride flush  3 mL Intravenous Once   Continuous Infusions:  LOS: 2 days   Time spent: [place], MD [facility]  04/27/2019, 10:02 AM

## 2019-04-27 NOTE — Progress Notes (Signed)
Chaplain responded to spiritual consult. Patient's wife, Rosario Adie, son Larey Days died by homicide earlier this year.  Patient concerned for her.  Chaplain offered ministry of presence and prayer.   Patient's wife is expected to come and be with him today. Rev. Tamsen Snider Pager [phone]

## 2019-04-27 NOTE — Plan of Care (Signed)
Patient stable, discussed POC with patient and s/o, agreeable with plan, denies question/concerns at this time.   Problem: Education: Goal: Knowledge of General Education information will improve Description: Including pain rating scale, medication(s)/side effects and non-pharmacologic comfort measures Outcome: Progressing

## 2019-04-27 NOTE — TOC Progression Note (Addendum)
Transition of Care [facility]) - Progression Note    Patient Details  Name: Anthony Nelson MRN: [id number] Date of Birth: Oct 30, 1960  Transition of Care [facility]) CM/SW Contact  Jacalyn Lefevre Edson Snowball, RN Phone Number: 04/27/2019, 12:08 PM  Clinical Narrative:     [name] to patient and Wife [name] at bedside.   Patient from home with wife. PCP is [facility]. Patient states due to covid he was unable to get an appointment at [place] , so his prescriptions ran out and that's why he came back to hospital.   Patient and wife both in agreement for NCM to call to schedule appointment. Next available appointment with Dr Chapman Fitch is May 21, 2019 at 3:50 pm.   Patient has home oxygen already through [facility].  Will use TOC pharmacy to assist with scripts at discharge.  Will continue to follow.  Expected Discharge Plan: Home/Self Care Barriers to Discharge: Continued Medical Work up  Expected Discharge Plan and Services Expected Discharge Plan: Home/Self Care   Discharge Planning Services: CM Consult   Living arrangements for the past 2 months: Single Family Home                 DME Arranged: N/A         HH Arranged: NA           Social Determinants of Health (SDOH) Interventions    Readmission Risk Interventions No flowsheet data found.

## 2019-04-28 LAB — CBC WITH DIFFERENTIAL/PLATELET
Abs Immature Granulocytes: 0.01 10*3/uL (ref 0.00–0.07)
Basophils Absolute: 0 10*3/uL (ref 0.0–0.1)
Basophils Relative: 0 %
Eosinophils Absolute: 0.1 10*3/uL (ref 0.0–0.5)
Eosinophils Relative: 2 %
HCT: 51.1 % (ref 39.0–52.0)
Hemoglobin: 15.9 g/dL (ref 13.0–17.0)
Immature Granulocytes: 0 %
Lymphocytes Relative: 15 %
Lymphs Abs: 0.8 10*3/uL (ref 0.7–4.0)
MCH: 34 pg (ref 26.0–34.0)
MCHC: 31.1 g/dL (ref 30.0–36.0)
MCV: 109.4 fL — ABNORMAL HIGH (ref 80.0–100.0)
Monocytes Absolute: 0.5 10*3/uL (ref 0.1–1.0)
Monocytes Relative: 9 %
Neutro Abs: 4 10*3/uL (ref 1.7–7.7)
Neutrophils Relative %: 74 %
Platelets: 181 10*3/uL (ref 150–400)
RBC: 4.67 MIL/uL (ref 4.22–5.81)
RDW: 14.5 % (ref 11.5–15.5)
WBC: 5.5 10*3/uL (ref 4.0–10.5)
nRBC: 0 % (ref 0.0–0.2)

## 2019-04-28 LAB — BASIC METABOLIC PANEL
Anion gap: 6 (ref 5–15)
BUN: 18 mg/dL (ref 6–20)
CO2: 45 mmol/L — ABNORMAL HIGH (ref 22–32)
Calcium: 8.6 mg/dL — ABNORMAL LOW (ref 8.9–10.3)
Chloride: 92 mmol/L — ABNORMAL LOW (ref 98–111)
Creatinine, Ser: 0.68 mg/dL (ref 0.61–1.24)
GFR calc Af Amer: >60 mL/min (ref >60)
GFR calc non Af Amer: >60 mL/min (ref >60)
Glucose, Bld: 126 mg/dL — ABNORMAL HIGH (ref 70–99)
Potassium: 4.8 mmol/L (ref 3.5–5.1)
Sodium: 143 mmol/L (ref 135–145)

## 2019-04-28 MED ORDER — DOCUSATE SODIUM 100 MG PO CAPS
100.0000 mg | ORAL_CAPSULE | Freq: Every day | ORAL | Status: DC
  Administered 2019-04-28 – 2019-04-30 (×3): 100 mg via ORAL
  Filled 2019-04-28 (×3): qty 1

## 2019-04-28 NOTE — Progress Notes (Addendum)
Hospitalist progress note  If 7PM-7AM,  night-coverage-look on AMION -prefer pages-not epic chat,please  Anthony Nelson  GIT:[id number] DOB: March 30, 1961 DOA: 04/24/2019 PCP: [name], MD  Narrative:  37 male former smoker severe pulmonary group 3 HTN EF 60-65% dilated RV PASP 80-follows with Dr. Oval Linsey cardiology, tobacco abuse with COPD/emphysema, transaminitis secondary to congestive hepatopathy--admit to [facility] 10/24 AM by my partner 2 weeks lower extremity edema shortness of breath-CXR = cardiomegaly EKG biatrial enlargement BNP 1100  Assessment & Plan: Acute diastolic heart failure/pulmonary hypertension 2/2 noncompliance IV Lasix 40 mg daily but likely can swithc to PO if connntues to have net neg fluid out and drop in wght over next several days--1.3 liters today, wght 55-->52 Lower extremity Dopplers 10/25 are negative for DVT COPD smoker Still smokes 3 to 4 cigarettes a day cessation counseling done  HTN with complications of hypotension See above-not on antihypertensives-monitor Prior transaminitis LFTs seems to have resolved Hypokalemia metabolic alkalosis Improved 4.8 , Co2 now 45 and trend carefully--suspect will improrve with lowering dose of lasix No confusion at this time Secondary polycythemia Probably from smoking  DVT prophylaxis: Lovenox  Code Status:   Full code presumed   Family Communication:   Discussed with patient and wife Disposition Plan: Inpatient and not ready for discharge--needs diuresis None yet Consultants:   None yet Procedures:   No Antimicrobials:   No Subjective:  Awake coherent says is passing more urine-feels swelling has come down significantly No cp   Objective: Vitals:   04/27/19 1237 04/27/19 1754 04/28/19 0010 04/28/19 0554  BP: 123/79 1_0  Pulse: 72 96 79 74  Resp: _1 Temp: 98.1 F (36.7 C) 98.3 F (36.8 C) 98 F (36.7 C) 98 F (36.7 C)  TempSrc: Oral Oral Oral Oral  SpO2: 96% 94% 97%  96%  Weight:    52.4 kg  Height:        Intake/Output Summary (Last 24 hours) at 04/28/2019 0823 Last data filed at 04/27/2019 2058 Gross per 24 hour  Intake 710 ml  Output 2250 ml  Net -1540 ml   Filed Weights   04/25/19 1326 04/27/19 0500 04/28/19 0554  Weight: 53.3 kg 55.5 kg 52.4 kg    Examination:  awake coherent no distress no cp eomi ncat slight build ctab no added sound no rales no ronchi Decreasing LE edema Neuro intact no focal deficit  Data Reviewed: I have personally reviewed following labs and imaging studies CBC: Recent Labs  Lab 04/24/19 1952 04/25/19 0527 04/26/19 0522 04/28/19 0432  WBC 5.7 5.9 4.8 5.5  NEUTROABS  --  3.8 3.4 4.0  HGB 18.1* 16.7 15.3 15.9  HCT 56.7* 52.9* 48.6 51.1  MCV 106.0* 106.4* 108.0* 109.4*  PLT 196 182 173 550   Basic Metabolic Panel: Recent Labs  Lab 04/24/19 1952 04/25/19 0527 04/26/19 0522 04/27/19 0741 04/28/19 0432  NA 141 142 144 141 143  K 3.4* 3.0* 3.3* 4.4 4.8  CL 90* 92* 88* 88* 92*  CO2 38* 36* 49* 43* 45*  GLUCOSE 123* 128* 94 97 126*  BUN 25* 23* _2 CREATININE 1.11 1.00 0.82 0.86 0.68  CALCIUM 9.2 8.7* 8.2* 8.5* 8.6*  MG  --  1.8 1.8 2.1  --   PHOS  --   --  3.8 3.9  --    GFR: Estimated Creatinine Clearance: 73.7 mL/min (by C-G formula based on SCr of 0.68 mg/dL). Liver Function Tests: Recent Labs  Lab 04/25/19  1833 04/26/19 0522 04/27/19 0741  AST 34  --   --   ALT 40  --   --   ALKPHOS 54  --   --   BILITOT 0.8  --   --   PROT 6.1*  --   --   ALBUMIN 3.5 3.0* 3.0*   No results for input(s): LIPASE, AMYLASE in the last 168 hours. No results for input(s): AMMONIA in the last 168 hours. Coagulation Profile: No results for input(s): INR, PROTIME in the last 168 hours. Cardiac Enzymes: Radiology Studies: Reviewed images personally in health database  Scheduled Meds: . enoxaparin (LOVENOX) injection  40 mg Subcutaneous Daily  . famotidine  20 mg Oral BID  . furosemide  40 mg  Intravenous Daily  . polyethylene glycol  17 g Oral Daily  . potassium chloride  20 mEq Oral BID  . sodium chloride flush  3 mL Intravenous Once   Continuous Infusions:  LOS: 3 days   Time spent: [place], MD [facility]  04/28/2019, 8:23 AM

## 2019-04-29 LAB — CBC WITH DIFFERENTIAL/PLATELET
Abs Immature Granulocytes: 0.02 10*3/uL (ref 0.00–0.07)
Basophils Absolute: 0 10*3/uL (ref 0.0–0.1)
Basophils Relative: 1 %
Eosinophils Absolute: 0.1 10*3/uL (ref 0.0–0.5)
Eosinophils Relative: 2 %
HCT: 48.4 % (ref 39.0–52.0)
Hemoglobin: 15.6 g/dL (ref 13.0–17.0)
Immature Granulocytes: 0 %
Lymphocytes Relative: 19 %
Lymphs Abs: 1 10*3/uL (ref 0.7–4.0)
MCH: 34.4 pg — ABNORMAL HIGH (ref 26.0–34.0)
MCHC: 32.2 g/dL (ref 30.0–36.0)
MCV: 106.8 fL — ABNORMAL HIGH (ref 80.0–100.0)
Monocytes Absolute: 0.6 10*3/uL (ref 0.1–1.0)
Monocytes Relative: 11 %
Neutro Abs: 3.7 10*3/uL (ref 1.7–7.7)
Neutrophils Relative %: 67 %
Platelets: 173 10*3/uL (ref 150–400)
RBC: 4.53 MIL/uL (ref 4.22–5.81)
RDW: 14.3 % (ref 11.5–15.5)
WBC: 5.5 10*3/uL (ref 4.0–10.5)
nRBC: 0 % (ref 0.0–0.2)

## 2019-04-29 LAB — RENAL FUNCTION PANEL
Albumin: 2.9 g/dL — ABNORMAL LOW (ref 3.5–5.0)
Anion gap: 6 (ref 5–15)
BUN: 20 mg/dL (ref 6–20)
CO2: 40 mmol/L — ABNORMAL HIGH (ref 22–32)
Calcium: 8.4 mg/dL — ABNORMAL LOW (ref 8.9–10.3)
Chloride: 95 mmol/L — ABNORMAL LOW (ref 98–111)
Creatinine, Ser: 0.68 mg/dL (ref 0.61–1.24)
GFR calc Af Amer: >60 mL/min (ref >60)
GFR calc non Af Amer: >60 mL/min (ref >60)
Glucose, Bld: 71 mg/dL (ref 70–99)
Phosphorus: 2.8 mg/dL (ref 2.5–4.6)
Potassium: 4.4 mmol/L (ref 3.5–5.1)
Sodium: 141 mmol/L (ref 135–145)

## 2019-04-29 MED ORDER — IPRATROPIUM-ALBUTEROL 0.5-2.5 (3) MG/3ML IN SOLN
3.0000 mL | Freq: Three times a day (TID) | RESPIRATORY_TRACT | Status: DC
  Administered 2019-04-30: 09:00:00 3 mL via RESPIRATORY_TRACT
  Filled 2019-04-29: qty 3

## 2019-04-29 MED ORDER — FUROSEMIDE 10 MG/ML IJ SOLN
20.0000 mg | Freq: Every day | INTRAMUSCULAR | Status: DC
  Administered 2019-04-30: 20 mg via INTRAVENOUS
  Filled 2019-04-29: qty 2

## 2019-04-29 MED ORDER — NICOTINE 14 MG/24HR TD PT24
14.0000 mg | MEDICATED_PATCH | Freq: Every day | TRANSDERMAL | Status: DC
  Administered 2019-04-30: 14 mg via TRANSDERMAL
  Filled 2019-04-29 (×2): qty 1

## 2019-04-29 MED ORDER — IPRATROPIUM-ALBUTEROL 0.5-2.5 (3) MG/3ML IN SOLN
3.0000 mL | Freq: Four times a day (QID) | RESPIRATORY_TRACT | Status: DC
  Administered 2019-04-29: 3 mL via RESPIRATORY_TRACT
  Filled 2019-04-29: qty 3

## 2019-04-29 MED ORDER — METHYLPREDNISOLONE SODIUM SUCC 125 MG IJ SOLR
60.0000 mg | Freq: Two times a day (BID) | INTRAMUSCULAR | Status: DC
  Administered 2019-04-29 – 2019-04-30 (×3): 60 mg via INTRAVENOUS
  Filled 2019-04-29 (×3): qty 2

## 2019-04-29 NOTE — Progress Notes (Addendum)
Provider paged and made aware pt oxygen saturation is between 85-91% on 2L of oxygen . When patient attempted to walk to restroom on room air, his oxygen saturation decreased to the upper 70s. Patient educated to have nasal cannula on at all times during stay to maintain appropriate oxygenation. Pt not always compliant with request to keep nasal cannula on.  Oxygen increase to 3L to maintain oxygen saturation above 92%.

## 2019-04-29 NOTE — Progress Notes (Signed)
Pt reduces to 2L of oxygen via nasal cannula. Pt able to maintain oxygenation saturation above 92% when wearing nasal cannula properly and with minimal exertion.

## 2019-04-29 NOTE — Progress Notes (Signed)
PROGRESS NOTE  Anthony Nelson FKC:[id number] DOB: 1960/09/25 DOA: 04/24/2019 PCP: [name], MD  Brief History   51 male former smoker severe pulmonary group 3 HTN EF 60-65% dilated RV PASP 80-follows with [facility] cardiology, tobacco abuse with COPD/emphysema, transaminitis secondary to congestive hepatopathy. The patient was admitted to [facility] 10/24 AM by my partner. The patient presented with 2 weeks of lower extremity edema, shortness of breath, and a CXR demonstrating cardiomegaly and pulmonary edema. BNP 1100.  The patient was admitted to a telemetry bed. He has been diuresed. His oxygen requirements are declining.   Consultants  . None  Procedures  . None  Antibiotics   Anti-infectives (From admission, onward)   None    .  Subjective  The patient complains of feeling tight in his chest and short of breath with conversation or activity,.  Objective   Vitals:  Vitals:   04/29/19 0554 04/29/19 1236  BP: 94/73 100/70  Pulse: 75 89  Resp: 18 18  Temp: 98.2 F (36.8 C) 98.3 F (36.8 C)  SpO2: 97% 94%   Exam:  Constitutional:  . The patient is awake, alert, and oriented x 3. No acute distress. Respiratory:  . No increased work of breathing, except with conversation. Marland Kitchen Poor air entry. Greatly diminished breath sounds. . No wheezes, rales, or rhonchi are auscultated. . No tactile fremitus Cardiovascular:  . Regular rate and rhythm . No murmurs, ectopy, or gallups. . No lateral PMI. No thrills. Abdomen:  . Abdomen is soft, non-tender, non-distended . No hernias, masses, or organomegaly . Normoactive bowel sounds.  Musculoskeletal:  . No cyanosis, clubbing, or edema Skin:  . No rashes, lesions, ulcers . palpation of skin: no induration or nodules Neurologic:  . CN 2-12 intact . Sensation all 4 extremities intact Psychiatric:  . Mental status o Mood, affect appropriate o Orientation to person, place, time  . judgment and insight appear intact I have  personally reviewed the following:   Today's Data  . Vitals, CBC, BMP  Scheduled Meds: . docusate sodium  100 mg Oral Daily  . enoxaparin (LOVENOX) injection  40 mg Subcutaneous Daily  . famotidine  20 mg Oral BID  . [START ON 04/30/2019] furosemide  20 mg Intravenous Daily  . ipratropium-albuterol  3 mL Nebulization Q6H  . methylPREDNISolone (SOLU-MEDROL) injection  60 mg Intravenous Q12H  . potassium chloride  20 mEq Oral BID  . sodium chloride flush  3 mL Intravenous Once   Continuous Infusions:  Principal Problem:   Acute on chronic diastolic CHF (congestive heart failure) (HCC) Active Problems:   COPD GOLD IV still smoking   Cor pulmonale (chronic) (HCC)   Acute respiratory failure with hypoxia (HCC)   LOS: 4 days   A & P   Acute diastolic heart failure/pulmonary hypertension 2/2 noncompliance. The patient is receiving IV Lasix 40 mg daily. I have reduced this to 20 mg daily due to low normal blood pressures. Plan to change to orals tomorrow. The patient has a negative fluid balance of 8.8 Liters since admission. Lower extremity Dopplers 10/25 are negative for DVT.  COPD exacerbation: I have added IV steroids and scheduled breathing treatments. This is currently his bigger breathing problem and the one that is preventing him from discharge.   Tobacco abuse: Ongoing. The patient still smokes 3 to 4 cigarettes a day cessation counseling done. Nicotine patch has been made available.   History of essential hypertension: Currently low normal blood pressures due to diuresis. Not on antihypertensives  at home. Will reduce lasix and change to oral lasix tomorrow.   Transaminitis on admission: Due to congestive heart failure. Resolved.   Hypokalemia: Due to metabolic acidosis. Both resolved. Monitor.  Secondary polycythemia: Likely due to Tobacco abuse.  I have seen and examined this patient myself. I have spent 45 minutes in his evaluation and care. More than 50% of this was  spent in the discussion with the patient's wife.  I have discussed the patient with his wife in detail. All questions answered to the best of my ability. DVT prophylaxis: Lovenox Code Status:   Full code  Family Communication:   Discussed with patient and wife Disposition Plan: Home. Inpatient and not ready for discharge due to COPD exacerbation  Chi Woodham, DO [facility] Direct contact: see [email or web address]  7PM-7AM contact night coverage as above 04/29/2019, 2:34 PM  LOS: 4 days

## 2019-04-29 NOTE — Progress Notes (Signed)
When I walked into room, patient was watching TV and had his O2 off.  Checked his O2 level, it was 77%.  Asked patient to put his [place] back in his nose and informed him that his O2 level was down.  Got patient back up to 87%.  Patient did confirm that he has COPD, that he came into hospital with increasing SOB.  No distress was noted at the time.  Will continue to monitor.

## 2019-04-30 MED ORDER — FUROSEMIDE 20 MG PO TABS: 20.0000 mg | ORAL_TABLET | Freq: Every day | ORAL | 0 refills | Status: DC

## 2019-04-30 MED ORDER — NICOTINE 14 MG/24HR TD PT24: 14.0000 mg | MEDICATED_PATCH | Freq: Every day | TRANSDERMAL | 0 refills | Status: DC

## 2019-04-30 MED ORDER — POTASSIUM CHLORIDE CRYS ER 20 MEQ PO TBCR: 20.0000 meq | EXTENDED_RELEASE_TABLET | Freq: Every day | ORAL | 0 refills | Status: DC

## 2019-04-30 MED ORDER — DOCUSATE SODIUM 100 MG PO CAPS: 100.0000 mg | ORAL_CAPSULE | Freq: Every day | ORAL | 0 refills | Status: DC

## 2019-04-30 MED ORDER — DOCUSATE SODIUM 100 MG PO CAPS: 100.0000 mg | ORAL_CAPSULE | Freq: Every day | ORAL | Status: DC

## 2019-04-30 MED ORDER — PREDNISONE 10 MG PO TABS: ORAL_TABLET | ORAL | 0 refills | Status: AC

## 2019-04-30 MED ORDER — IPRATROPIUM-ALBUTEROL 0.5-2.5 (3) MG/3ML IN SOLN: 3.0000 mL | Freq: Two times a day (BID) | RESPIRATORY_TRACT | Status: DC

## 2019-04-30 NOTE — Progress Notes (Signed)
Pt. sats 89% via room air resting. While ambulating with oxygen patients sats dropped to 76% briefly then returned to 94%, hr 88, rr 18. Patient does not complain of shortness of breath.

## 2019-04-30 NOTE — Discharge Summary (Signed)
Physician Discharge Summary  [name] HER:[id number] DOB: [birth year] DOA: 04/24/2019  PCP: [name], MD  Admit date: 04/24/2019 Discharge date: 04/30/2019  Recommendations for Outpatient Follow-up:  1. Home health RN and RT. 2. O2 2-3 L by nasal cannula continuous. 3. Follow up with PCP in 7-10 days. 4. Weigh yourself daily at the same time and same location each day. Write down your weight. Call your doctor if you gain 3 lbs in one day or 5 lbs in 1 week. 5. Have chemistry drawn on 05/07/2019. Report results to PCP. 6. Stop smoking  Follow-up [facility] up.   Why: May 21, 2019 at 3:50 pm  Contact information: [address] [place] [address] [phone]         Discharge Diagnoses: Principal diagnosis is #1 1. Acute on chronic respiratory failure 2. Diastolic CHF exacerbation 3. COPD exacerbation 4. Tobacco abuse 5. Hypertesnion 6. Hypotension 7. Metabolic acidosis  Discharge Condition: Fair  Disposition: Home  Diet recommendation:Heart healthy  Filed Weights   04/28/19 0554 04/29/19 0056 04/30/19 0043  Weight: 52.4 kg 51.7 kg 50.4 kg    History of present illness:  Anthony Nelson is a 58 y.o. male with history of COPD, diastolic dysfunction ongoing tobacco abuse presents to the ER with complaint of shortness of breath which has been progressive worsening over the last 2 weeks.  Patient also has noticed increasing lower extremity edema.  Patient has been taking his Lasix 20 mg p.o. daily.  Denies any fever chills chest pain productive cough.  ED Course: On exam patient has elevated JVD and bilateral lower extremity edema extending up to the thigh.  Chest x-ray shows cardiomegaly with signs of pulmonary hypertension.  EKG shows sinus rhythm with biatrial enlargement LVH and nonspecific T changes.  Labs show BNP of 1169.  Potassium 3.4.  Patient was given 40 mg IV Lasix and  admitted for acute CHF.  Hospital Course:  89 male former smoker severe pulmonary group 3 HTN EF 60-65% dilated RV PASP 80-follows with [facility] cardiology, tobacco abuse with COPD/emphysema, transaminitis secondary to congestive hepatopathy. The patient was admitted to [facility] 10/24 AM by my partner. The patient presented with 2 weeks of lower extremity edema, shortness of breath, and a CXR demonstrating cardiomegaly and pulmonary edema. BNP 1100.  The patient was admitted to a telemetry bed. He has been diuresed. His oxygen requirements are declining. The patient developed an exacerbation of COPD. His nebulizer treatments were made scheduled, and his steroids were increased. His oxygen status has returned to baseline. The patient has not been compliant with his O2 in his room. I have asked nursing to ambulate the patient on 2 liters. He started out at 89%. When he began walking he initially dropped to 76%, but then quickly recovered to 94% with a heart rate of 88 and respiratory rate at 18.  Today's assessment: S: The patient is resting comfortably. No new complaints. O: Vitals:  Vitals:   04/30/19 0449 04/30/19 0852  BP: 99/76   Pulse: 81   Resp: 18 18  Temp: 97.9 F (36.6 C)   SpO2: 93%     Constitutional:   The patient is awake, alert, and oriented x 3. No acute distress. Respiratory:   No increased work of breathing  Improved air entry.   No wheezes, rales, or rhonchi are auscultated.  No tactile fremitus Cardiovascular:   Regular rate and rhythm  No murmurs, ectopy,  or gallups.  No lateral PMI. No thrills. Abdomen:   Abdomen is soft, non-tender, non-distended  No hernias, masses, or organomegaly  Normoactive bowel sounds.  Musculoskeletal:   No cyanosis, clubbing, or edema Skin:   No rashes, lesions, ulcers  palpation of skin: no induration or nodules Neurologic:   CN 2-12 intact  Sensation all 4 extremities intact Psychiatric:   Mental  status ? Mood, affect appropriate ? Orientation to person, place, time   judgment and insight appear intact  Discharge Instructions  Discharge Instructions    (HEART FAILURE PATIENTS) Call MD:  Anytime you have any of the following symptoms: 1) 3 pound weight gain in 24 hours or 5 pounds in 1 week 2) shortness of breath, with or without a dry hacking cough 3) swelling in the hands, feet or stomach 4) if you have to sleep on extra pillows at night in order to breathe.   Complete by: As directed    Activity as tolerated - No restrictions   Complete by: As directed    Call MD for:  difficulty breathing, headache or visual disturbances   Complete by: As directed    Call MD for:  persistant dizziness or light-headedness   Complete by: As directed    Call MD for:  temperature >100.4   Complete by: As directed    Diet - low sodium heart healthy   Complete by: As directed    Discharge instructions   Complete by: As directed    Home health RN and RT Pt to continue O2 at home as prior to admission. 2-3L O2 continuous by nasal cannula. Follow up with PCP in 7-10 days. Weigh yourself upon arriving home and each day at the same day and location. Record values.   Increase activity slowly   Complete by: As directed      Allergies as of 04/30/2019      Reactions   Erythromycin       Medication List    TAKE these medications   albuterol 108 (90 Base) MCG/ACT inhaler Commonly known as: VENTOLIN HFA Inhale 2 puffs into the lungs every 6 (six) hours as needed for wheezing or shortness of breath.   docusate sodium 100 MG capsule Commonly known as: COLACE Take 1 capsule (100 mg total) by mouth daily. Start taking on: May 01, 2019   famotidine 20 MG tablet Commonly known as: PEPCID Take 1 tablet (20 mg total) by mouth 2 (two) times daily.   furosemide 20 MG tablet Commonly known as: Lasix Take 1 tablet (20 mg total) by mouth daily. What changed: how much to take    Glycopyrrolate-Formoterol 9-4.8 MCG/ACT Aero Commonly known as: Bevespi Aerosphere Inhale 2 puffs into the lungs 2 (two) times daily.   nicotine 14 mg/24hr patch Commonly known as: NICODERM CQ - dosed in mg/24 hours Place 1 patch (14 mg total) onto the skin daily. Start taking on: May 01, 2019   OXYGEN 2lpm as needed per pt  [facility]   potassium chloride SA 20 MEQ tablet Commonly known as: KLOR-CON Take 1 tablet (20 mEq total) by mouth daily.   predniSONE 10 MG tablet Commonly known as: DELTASONE Take 4 tablets (40 mg total) by mouth daily for 3 days, THEN 3 tablets (30 mg total) daily for 3 days, THEN 2 tablets (20 mg total) daily for 3 days, THEN 1 tablet (10 mg total) daily for 3 days, THEN 0.5 tablets (5 mg total) daily for 3 days. Start taking on: April 30, 2019      Allergies  Allergen Reactions   Erythromycin     The results of significant diagnostics from this hospitalization (including imaging, microbiology, ancillary and laboratory) are listed below for reference.    Significant Diagnostic Studies: Dg Chest 2 View  Result Date: 04/24/2019 CLINICAL DATA:  Chest pain and fatigue.  Decreased oxygen saturation EXAM: CHEST - 2 VIEW COMPARISON:  February 08, 2018 FINDINGS: There is no evident edema or consolidation. Heart is borderline enlarged. There is prominence of the main pulmonary arteries with rapid peripheral tapering. No adenopathy. No bone lesions. IMPRESSION: Borderline cardiomegaly with evidence of a degree of pulmonary arterial hypertension. There is no demonstrable edema or consolidation. No adenopathy appreciable. Electronically Signed   By: Lowella Grip III M.D.   On: 04/24/2019 20:18   Vas [place] Lower Extremity Venous (dvt)  Result Date: 04/25/2019  Lower Venous Study Indications: Swelling.  Comparison Study: 02/09/2018 negative bilateral lower extremity venous duplex Performing Technologist: Maudry Mayhew MHA, RDMS, RVT, RDCS  Examination  Guidelines: A complete evaluation includes B-mode imaging, spectral Doppler, color Doppler, and power Doppler as needed of all accessible portions of each vessel. Bilateral testing is considered an integral part of a complete examination. Limited examinations for reoccurring indications may be performed as noted.  +---------+---------------+---------+-----------+----------+--------------+  RIGHT     Compressibility Phasicity Spontaneity Properties Thrombus Aging  +---------+---------------+---------+-----------+----------+--------------+  CFV       Full            No        Yes                                    +---------+---------------+---------+-----------+----------+--------------+  SFJ       Full                                                             +---------+---------------+---------+-----------+----------+--------------+  FV Prox   Full                                                             +---------+---------------+---------+-----------+----------+--------------+  FV Mid    Full                                                             +---------+---------------+---------+-----------+----------+--------------+  FV Distal Full                                                             +---------+---------------+---------+-----------+----------+--------------+  PFV       Full                                                             +---------+---------------+---------+-----------+----------+--------------+  POP       Full            No        Yes                                    +---------+---------------+---------+-----------+----------+--------------+  PTV       Full                                                             +---------+---------------+---------+-----------+----------+--------------+  PERO      Full                                                             +---------+---------------+---------+-----------+----------+--------------+   +---------+---------------+---------+-----------+----------+--------------+  LEFT      Compressibility Phasicity Spontaneity Properties Thrombus Aging  +---------+---------------+---------+-----------+----------+--------------+  CFV       Full            No        Yes                                    +---------+---------------+---------+-----------+----------+--------------+  SFJ       Full                                                             +---------+---------------+---------+-----------+----------+--------------+  FV Prox   Full                                                             +---------+---------------+---------+-----------+----------+--------------+  FV Mid    Full                                                             +---------+---------------+---------+-----------+----------+--------------+  FV Distal Full                                                             +---------+---------------+---------+-----------+----------+--------------+  PFV       Full                                                             +---------+---------------+---------+-----------+----------+--------------+  POP       Full            No        Yes                                    +---------+---------------+---------+-----------+----------+--------------+  PTV       Full                                                             +---------+---------------+---------+-----------+----------+--------------+  PERO      Full                                                             +---------+---------------+---------+-----------+----------+--------------+  Summary: Right: There is no evidence of deep vein thrombosis in the lower extremity. No cystic structure found in the popliteal fossa. Left: There is no evidence of deep vein thrombosis in the lower extremity. No cystic structure found in the popliteal fossa.  Pulsatile lower extremity venous flow is suggestive of possible elevated right heart  pressure. *See table(s) above for measurements and observations. Electronically signed by Monica Martinez MD on 04/25/2019 at 6:38:25 PM.    Final     Microbiology: Recent Results (from the past 240 hour(s))  SARS CORONAVIRUS 2 (TAT 6-24 HRS) Nasopharyngeal Nasopharyngeal Swab     Status: None   Collection Time: 04/25/19  3:26 AM   Specimen: Nasopharyngeal Swab  Result Value Ref Range Status   SARS Coronavirus 2 NEGATIVE NEGATIVE Final    Comment: (NOTE) SARS-CoV-2 target nucleic acids are NOT DETECTED. The SARS-CoV-2 RNA is generally detectable in upper and lower respiratory specimens during the acute phase of infection. Negative results do not preclude SARS-CoV-2 infection, do not rule out co-infections with other pathogens, and should not be used as the sole basis for treatment or other patient management decisions. Negative results must be combined with clinical observations, patient history, and epidemiological information. The expected result is Negative. Fact Sheet for Patients: SugarRoll.be Fact Sheet for Healthcare Providers: https://www.woods-mathews.com/ This test is not yet approved or cleared by the [place] FDA and  has been authorized for detection and/or diagnosis of SARS-CoV-2 by FDA under an Emergency Use Authorization (EUA). This EUA will remain  in effect (meaning this test can be used) for the duration of the COVID-19 declaration under Section 56 4(b)(1) of the Act, 21 U.S.C. section 360bbb-3(b)(1), unless the authorization is terminated or revoked sooner. Performed at [facility], [place] [address] [place], [place] [address]      Labs: Basic Metabolic Panel: Recent Labs  Lab 04/25/19 0527 04/26/19 0522 04/27/19 0741 04/28/19 0432 04/29/19 0444  NA 142 144 141 143 141  K 3.0* 3.3* 4.4 4.8 4.4  CL 92* 88* 88* 92* 95*  CO2 36* 49* 43* 45* 40*  GLUCOSE 128* 94 97 126* 71  BUN 23* _0 CREATININE 1.00 0.82 0.86 0.68 0.68  CALCIUM 8.7* 8.2* 8.5* 8.6* 8.4*  MG 1.8 1.8 2.1  --   --  PHOS  --  3.8 3.9  --  2.8   Liver Function Tests: Recent Labs  Lab 04/25/19 0527 04/26/19 0522 04/27/19 0741 04/29/19 0444  AST 34  --   --   --   ALT 40  --   --   --   ALKPHOS 54  --   --   --   BILITOT 0.8  --   --   --   PROT 6.1*  --   --   --   ALBUMIN 3.5 3.0* 3.0* 2.9*   No results for input(s): LIPASE, AMYLASE in the last 168 hours. No results for input(s): AMMONIA in the last 168 hours. CBC: Recent Labs  Lab 04/24/19 1952 04/25/19 0527 04/26/19 0522 04/28/19 0432 04/29/19 0444  WBC 5.7 5.9 4.8 5.5 5.5  NEUTROABS  --  3.8 3.4 4.0 3.7  HGB 18.1* 16.7 15.3 15.9 15.6  HCT 56.7* 52.9* 48.6 51.1 48.4  MCV 106.0* 106.4* 108.0* 109.4* 106.8*  PLT 196 182 173 181 173   Cardiac Enzymes: No results for input(s): CKTOTAL, CKMB, CKMBINDEX, TROPONINI in the last 168 hours. BNP: BNP (last 3 results) Recent Labs    04/24/19 1952  BNP 1,169.3*    ProBNP (last 3 results) No results for input(s): PROBNP in the last 8760 hours.  CBG: No results for input(s): GLUCAP in the last 168 hours.  Principal Problem:   Acute on chronic diastolic CHF (congestive heart failure) (HCC) Active Problems:   COPD GOLD IV still smoking   Cor pulmonale (chronic) (HCC)   Acute respiratory failure with hypoxia ([place])   Time coordinating discharge: 38 minutes.  Signed:        Giovan Pinsky, DO [facility]  04/30/2019, 2:57 PM

## 2019-05-21 ENCOUNTER — Ambulatory Visit: Payer: Self-pay | Attending: Family Medicine | Admitting: Family Medicine

## 2019-05-21 ENCOUNTER — Other Ambulatory Visit: Payer: Self-pay

## 2019-05-21 ENCOUNTER — Encounter: Payer: Self-pay | Admitting: Family Medicine

## 2019-05-21 VITALS — BP 103/68 | HR 89 | Temp 98.2°F | Resp 18 | Ht 72.0 in | Wt 115.0 lb

## 2019-05-21 DIAGNOSIS — Z09 Encounter for follow-up examination after completed treatment for conditions other than malignant neoplasm: Secondary | ICD-10-CM

## 2019-05-21 DIAGNOSIS — J449 Chronic obstructive pulmonary disease, unspecified: Secondary | ICD-10-CM

## 2019-05-21 DIAGNOSIS — I509 Heart failure, unspecified: Secondary | ICD-10-CM

## 2019-05-21 DIAGNOSIS — I10 Essential (primary) hypertension: Secondary | ICD-10-CM

## 2019-05-21 DIAGNOSIS — I5032 Chronic diastolic (congestive) heart failure: Secondary | ICD-10-CM

## 2019-05-21 MED ORDER — ALBUTEROL SULFATE HFA 108 (90 BASE) MCG/ACT IN AERS: 2.0000 | INHALATION_SPRAY | Freq: Four times a day (QID) | RESPIRATORY_TRACT | 11 refills | Status: DC | PRN

## 2019-05-21 MED ORDER — FUROSEMIDE 20 MG PO TABS: 20.0000 mg | ORAL_TABLET | Freq: Every day | ORAL | 3 refills | Status: DC

## 2019-05-21 MED ORDER — POTASSIUM CHLORIDE CRYS ER 20 MEQ PO TBCR: 20.0000 meq | EXTENDED_RELEASE_TABLET | Freq: Every day | ORAL | 3 refills | Status: DC

## 2019-05-21 MED ORDER — INCRUSE ELLIPTA 62.5 MCG/INH IN AEPB: 1.0000 | INHALATION_SPRAY | Freq: Every day | RESPIRATORY_TRACT | 11 refills | Status: DC

## 2019-05-21 MED FILL — INCRUSE ELLIPTA 62.5 MCG IN: 62.5 | 30 days supply | Qty: 30 | Fill #0

## 2019-05-21 MED FILL — ALBUTEROL SULFATE HFA 108 (: 108 (90 BAS | 25 days supply | Qty: 9 | Fill #0

## 2019-05-21 MED FILL — FUROSEMIDE 20 MG TABS: 20 | 30 days supply | Qty: 30 | Fill #0

## 2019-05-21 MED FILL — POTASSIUM CL ER 20 MEQ TAB: 20 | 30 days supply | Qty: 30 | Fill #0

## 2019-05-21 NOTE — Progress Notes (Addendum)
Established Patient Office Visit  Subjective:  Patient ID: Anthony Nelson, male    DOB: [birth year]  Age: 58 y.o. MRN: [id number]  CC: Hospital follow-up  Where: [facility] When: 04/24/2019 through 04/30/2019 Diagnoses: Acute on chronic respiratory failure, diastolic CHF exacerbation, COPD exacerbation, tobacco abuse, hypertension, hypotension and metabolic acidosis  HPI Anthony Nelson, 58 yo male who was last seen in the office on 03/06/2018, who is being seen in follow-up of recent hospitalization at [facility] from 04/24/2019 and 04/30/2019 due to acute on chronic respiratory failure, acute on chroinc diastolic heart failure, COPD exacerbation, chronic cor pulmonale and hypertension.         He reports that prior to his hospitalization, he has started experiencing increased shortness of breath, swelling in his lower legs and difficulty lying down flat when trying to sleep.  He feels better status post hospitalization but admits to some decreased mood/depression due to currently having to wear oxygen as he has previously felt healthy though was having some fatigue and shortness of breath with exertion before recent onset of swelling in his legs and increased difficulty breathing.  He is worried financially about not being able to work currently due to his health status.  He does continue to smoke but knows that he cannot do so well around his oxygen and he is working on decreasing cigarette use and is down to only a few per day.  He would like to completely quit but is not sure if he can at this time as he feels that it also helps with stress.  He is taking his medications as per hospital discharge.  He continues to have some swelling in his legs.  He continues to have some shortness of breath when lying down versus being in a more upright position.  He denies any chest pain or palpitations, no headaches or dizziness.  Past Medical History:  Diagnosis Date  . Bronchitis   . COPD  (chronic obstructive pulmonary disease) ([place])   . Diastolic heart failure ([place])   . GERD (gastroesophageal reflux disease)   . Pulmonary hypertension ([place])   . 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 Respiratory failure with hypoxia [facility])     Past Surgical History:  Procedure Laterality Date  . HERNIA REPAIR      Family History  Problem Relation Age of Onset  . Cancer Mother   . Cancer Father   . CAD Father   . Heart failure Father   . Diabetes Maternal Grandmother   . Kidney disease Maternal Grandmother   . Kidney cancer Paternal Grandmother   . Heart attack Paternal Grandfather     Social History   Socioeconomic History  . Marital status: Married    Spouse name: Not on file  . Number of children: Not on file  . Years of education: Not on file  . Highest education level: Not on file  Occupational History  . Not on file  Tobacco Use  . Smoking status: Current Every Day Smoker    Packs/day: 1.00    Years: 44.00    Pack years: 44.00    Types: Cigarettes  . Smokeless tobacco: Never Used  Substance and Sexual Activity  . Alcohol use: Not Currently    Comment: Socially   . Drug use: No  . Sexual activity: Not Currently  Other Topics Concern  . Not on file  Social History Narrative  . Not on file   Social Determinants  of Health   Financial Resource Strain:   . Difficulty of Paying Living Expenses:   Food Insecurity:   . Worried About Charity fundraiser in the Last Year:   . Arboriculturist in the Last Year:   Transportation Needs:   . Film/video editor (Medical):   Marland Kitchen Lack of Transportation (Non-Medical):   Physical Activity:   . Days of Exercise per Week:   . Minutes of Exercise per Session:   Stress:   . Feeling of Stress :   Social Connections:   . Frequency of Communication with Friends and Family:   . Frequency of Social Gatherings with Friends and Family:   . Attends Religious Services:   .  Active Member of Clubs or Organizations:   . Attends Archivist Meetings:   Marland Kitchen Marital Status:   Intimate Partner Violence:   . Fear of Current or Ex-Partner:   . Emotionally Abused:   Marland Kitchen Physically Abused:   . Sexually Abused:     Outpatient Medications Prior to Visit  Medication Sig Dispense Refill  . docusate sodium (COLACE) 100 MG capsule Take 1 capsule (100 mg total) by mouth daily. 10 capsule 0  . OXYGEN 2lpm as needed per pt  [facility]    . albuterol (PROVENTIL HFA;VENTOLIN HFA) 108 (90 Base) MCG/ACT inhaler Inhale 2 puffs into the lungs every 6 (six) hours as needed for wheezing or shortness of breath. 1 Inhaler 11  . famotidine (PEPCID) 20 MG tablet Take 1 tablet (20 mg total) by mouth 2 (two) times daily. 60 tablet 3  . furosemide (LASIX) 20 MG tablet Take 1 tablet (20 mg total) by mouth daily. 30 tablet 0  . Glycopyrrolate-Formoterol (BEVESPI AEROSPHERE) 9-4.8 MCG/ACT AERO Inhale 2 puffs into the lungs 2 (two) times daily. (Patient not taking: Reported on 06/09/2019) 1 Inhaler 11  . nicotine (NICODERM CQ - DOSED IN MG/24 HOURS) 14 mg/24hr patch Place 1 patch (14 mg total) onto the skin daily. (Patient not taking: Reported on 06/24/2019) 28 patch 0  . potassium chloride SA (KLOR-CON) 20 MEQ tablet Take 1 tablet (20 mEq total) by mouth daily. 30 tablet 0   No facility-administered medications prior to visit.    Allergies  Allergen Reactions  . Erythromycin     ROS Review of Systems  Constitutional: Positive for fatigue. Negative for chills and fever.  HENT: Negative for sore throat and trouble swallowing.   Eyes: Negative for photophobia and visual disturbance.  Respiratory: Positive for shortness of breath. Negative for cough.   Cardiovascular: Positive for leg swelling. Negative for chest pain and palpitations.  Gastrointestinal: Negative for abdominal pain, constipation, diarrhea and nausea.  Endocrine: Positive for polyuria (Due to Lasix). Negative for polydipsia  and polyphagia.  Genitourinary: Positive for frequency (Due to Lasix use). Negative for dysuria.  Musculoskeletal: Negative for arthralgias and back pain.  Neurological: Negative for dizziness and headaches.  Hematological: Negative for adenopathy. Does not bruise/bleed easily.  Psychiatric/Behavioral: Negative for self-injury and suicidal ideas. The patient is nervous/anxious.       Objective:    Physical Exam  Constitutional: He is oriented to person, place, and time. He appears well-developed and well-nourished.  Neck: No JVD present.  Cardiovascular: Normal rate and regular rhythm.  Pulmonary/Chest: Effort normal and breath sounds normal. No respiratory distress. He has no wheezes.  Abdominal: Soft. There is no abdominal tenderness. There is no rebound and no guarding.  Musculoskeletal:        General:  Edema (Bilateral distal lower extremity edema, nonpitting) present.     Cervical back: Normal range of motion.  Lymphadenopathy:    He has no cervical adenopathy.  Neurological: He is alert and oriented to person, place, and time.  Skin: Skin is warm and dry.  Psychiatric: He has a normal mood and affect.  Nursing note and vitals reviewed.   BP 103/68 (BP Location: Left Arm, Patient Position: Sitting, Cuff Size: Normal)   Pulse 89   Temp 98.2 F (36.8 C) (Oral)   Resp 18   Ht 6' (1.829 m)   Wt 115 lb (52.2 kg)   SpO2 (!) 85%   BMI 15.60 kg/m  Wt Readings from Last 3 Encounters:  08/11/19 115 lb (52.2 kg)  06/24/19 121 lb (54.9 kg)  05/21/19 115 lb (52.2 kg)     Health Maintenance Due  Topic Date Due  . COVID-19 Vaccine (1) Never done  . COLONOSCOPY  Never done     Lab Results  Component Value Date   TSH 1.757 04/25/2019   Lab Results  Component Value Date   WBC 5.9 06/24/2019   HGB 17.1 06/24/2019   HCT 48.6 06/24/2019   MCV 96 06/24/2019   PLT 152 06/24/2019   Lab Results  Component Value Date   NA 144 06/24/2019   K 3.8 06/24/2019   CO2 39 (H)  06/24/2019   GLUCOSE 71 06/24/2019   BUN 20 06/24/2019   CREATININE 1.07 06/24/2019   BILITOT 0.4 08/11/2019   ALKPHOS 67 08/11/2019   AST 27 08/11/2019   ALT 32 08/11/2019   PROT 7.2 08/11/2019   ALBUMIN 4.3 08/11/2019   CALCIUM 9.1 06/24/2019   ANIONGAP 6 04/29/2019   No results found for: CHOL No results found for: HDL No results found for: LDLCALC No results found for: TRIG No results found for: CHOLHDL No results found for: HGBA1C    Assessment & Plan:  1. Chronic diastolic heart failure ([place]);; 4.  Hospital discharge follow-up  Discussed the importance of compliance with medications for treatment of congestive heart failure and hypertension as well as cardiology follow-up.  Discussed the need to monitor weight daily which can help patient tell if he is having an increase in fluid retention which can lead to CHF exacerbation.  We will recheck electrolytes due to use of Lasix for treatment of CHF.  Educational material on CHF/heart failure and self-care was provided to the patient as part of his after visit summary. - Basic Metabolic Panel  2. Chronic obstructive pulmonary disease, unspecified COPD type ([place]) Patient currently continues to smoke and I discussed the importance of complete smoking cessation as patient with gold stage II COPD.  He will continue daily compliance with Incruse Ellipta and as needed use of albuterol with continued pulmonology follow-up.  Pulmonology referral placed at today's visit. - albuterol (VENTOLIN HFA) 108 (90 Base) MCG/ACT inhaler; Inhale 2 puffs into the lungs every 6 (six) hours as needed for wheezing or shortness of breath.  Dispense: 18 g; Refill: 11 - umeclidinium bromide (INCRUSE ELLIPTA) 62.5 MCG/INH AEPB; Inhale 1 puff into the lungs daily.  Dispense: 1 each; Refill: 11 - Ambulatory referral to Pulmonology  3.  Essential hypertension Patient has history of hypertension but during hospitalization experienced issues with hypotension.   Continue use of Lasix for CHF which is also controlling patient's hypertension.  Blood pressure is low normal and therefore no need to institute additional medications for control of blood pressure at this time.  An  After Visit Summary was printed and given to the patient.   Follow-up: Return in about 4 weeks (around 06/18/2019) for CHF/COPD-needs to see pulmonologist at this office please.    [name], MD

## 2019-05-21 NOTE — Patient Instructions (Signed)
COPD and Physical Activity Chronic obstructive pulmonary disease (COPD) is a long-term (chronic) condition that affects the lungs. COPD is a general term that can be used to describe many different lung problems that cause lung swelling (inflammation) and limit airflow, including chronic bronchitis and emphysema. The main symptom of COPD is shortness of breath, which makes it harder to do even simple tasks. This can also make it harder to exercise and be active. Talk with your health care provider about treatments to help you breathe better and actions you can take to prevent breathing problems during physical activity. What are the benefits of exercising with COPD? Exercising regularly is an important part of a healthy lifestyle. You can still exercise and do physical activities even though you have COPD. Exercise and physical activity improve your shortness of breath by increasing blood flow (circulation). This causes your heart to pump more oxygen through your body. Moderate exercise can improve your:  Oxygen use.  Energy level.  Shortness of breath.  Strength in your breathing muscles.  Heart health.  Sleep.  Self-esteem and feelings of self-worth.  Depression, stress, and anxiety levels. Exercise can benefit everyone with COPD. The severity of your disease may affect how hard you can exercise, especially at first, but everyone can benefit. Talk with your health care provider about how much exercise is safe for you, and which activities and exercises are safe for you. What actions can I take to prevent breathing problems during physical activity?  Sign up for a pulmonary rehabilitation program. This type of program may include: ? Education about lung diseases. ? Exercise classes that teach you how to exercise and be more active while improving your breathing. This usually involves:  Exercise using your lower extremities, such as a stationary bicycle.  About 30 minutes of exercise, 2  to 5 times per week, for 6 to 12 weeks  Strength training, such as push ups or leg lifts. ? Nutrition education. ? Group classes in which you can talk with others who also have COPD and learn ways to manage stress.  If you use an oxygen tank, you should use it while you exercise. Work with your health care provider to adjust your oxygen for your physical activity. Your resting flow rate is different from your flow rate during physical activity.  While you are exercising: ? Take slow breaths. ? Pace yourself and do not try to go too fast. ? Purse your lips while breathing out. Pursing your lips is similar to a kissing or whistling position. ? If doing exercise that uses a quick burst of effort, such as weight lifting:  Breathe in before starting the exercise.  Breathe out during the hardest part of the exercise (such as raising the weights). Where to find support You can find support for exercising with COPD from:  Your health care provider.  A pulmonary rehabilitation program.  Your local health department or community health programs.  Support groups, online or in-person. Your health care provider may be able to recommend support groups. Where to find more information You can find more information about exercising with COPD from:  American Lung Association: lung.org.  COPD Foundation: copdfoundation.org. Contact a health care provider if:  Your symptoms get worse.  You have chest pain.  You have nausea.  You have a fever.  You have trouble talking or catching your breath.  You want to start a new exercise program or a new activity. Summary  COPD is a general term that can   be used to describe many different lung problems that cause lung swelling (inflammation) and limit airflow. This includes chronic bronchitis and emphysema.  Exercise and physical activity improve your shortness of breath by increasing blood flow (circulation). This causes your heart to provide more  oxygen to your body.  Contact your health care provider before starting any exercise program or new activity. Ask your health care provider what exercises and activities are safe for you. This information is not intended to replace advice given to you by your health care provider. Make sure you discuss any questions you have with your health care provider. Document Released: 07/11/2017 Document Revised: 10/08/2018 Document Reviewed: 07/11/2017 Elsevier Patient Education  2020 [place].  Heart Failure, Self Care Heart failure is a serious condition. This sheet explains things you need to do to take care of yourself at home. To help you stay as healthy as possible, you may be asked to change your diet, take certain medicines, and make other changes in your life. Your doctor may also give you more specific instructions. If you have problems or questions, call your doctor. What are the risks? Having heart failure makes it more likely for you to have some problems. These problems can get worse if you do not take good care of yourself. Problems may include:  Blood clotting problems. This may cause a stroke.  Damage to the kidneys, liver, or lungs.  Abnormal heart rhythms. Supplies needed:  Scale for weighing yourself.  Blood pressure monitor.  Notebook.  Medicines. How to care for yourself when you have heart failure Medicines Take over-the-counter and prescription medicines only as told by your doctor. Take your medicines every day.  Do not stop taking your medicine unless your doctor tells you to do so.  Do not skip any medicines.  Get your prescriptions refilled before you run out of medicine. This is important. Eating and drinking   Eat heart-healthy foods. Talk with a diet specialist (dietitian) to create an eating plan.  Choose foods that: ? Have no trans fat. ? Are low in saturated fat and cholesterol.  Choose healthy foods, such as: ? Fresh or frozen fruits and  vegetables. ? Fish. ? Low-fat (lean) meats. ? Legumes, such as beans, peas, and lentils. ? Fat-free or low-fat dairy products. ? Whole-grain foods. ? High-fiber foods.  Limit salt (sodium) if told by your doctor. Ask your diet specialist to tell you which seasonings are healthy for your heart.  Cook in healthy ways instead of frying. Healthy ways of cooking include roasting, grilling, broiling, baking, poaching, steaming, and stir-frying.  Limit how much fluid you drink, if told by your doctor. Alcohol use  Do not drink alcohol if: ? Your doctor tells you not to drink. ? Your heart was damaged by alcohol, or you have very bad heart failure. ? You are pregnant, may be pregnant, or are planning to become pregnant.  If you drink alcohol: ? Limit how much you use to:  0-1 drink a day for women.  0-2 drinks a day for men. ? Be aware of how much alcohol is in your drink. In the [place] one drink equals one 12 oz bottle of beer (355 mL), one 5 oz glass of wine (148 mL), or one 1 oz glass of hard liquor (44 mL). Lifestyle   Do not use any products that contain nicotine or tobacco, such as cigarettes, e-cigarettes, and chewing tobacco. If you need help quitting, ask your doctor. ? Do not use nicotine  gum or patches before talking to your doctor.  Do not use illegal drugs.  Lose weight if told by your doctor.  Do physical activity if told by your doctor. Talk to your doctor before you begin an exercise if: ? You are an older adult. ? You have very bad heart failure.  Learn to manage stress. If you need help, ask your doctor.  Get rehab (rehabilitation) to help you stay independent and to help with your quality of life.  Plan time to rest when you get tired. Check weight and blood pressure   Weigh yourself every day. This will help you to know if fluid is building up in your body. ? Weigh yourself every morning after you pee (urinate) and before you eat breakfast. ? Wear the  same amount of clothing each time. ? Write down your daily weight. Give your record to your doctor.  Check and write down your blood pressure as told by your doctor.  Check your pulse as told by your doctor. Dealing with very hot and very cold weather  If it is very hot: ? Avoid activities that take a lot of energy. ? Use air conditioning or fans, or find a cooler place. ? Avoid caffeine and alcohol. ? Wear clothing that is loose-fitting, lightweight, and light-colored.  If it is very cold: ? Avoid activities that take a lot of energy. ? Layer your clothes. ? Wear mittens or gloves, a hat, and a scarf when you go outside. ? Avoid alcohol. Follow these instructions at home:  Stay up to date with shots (vaccines). Get pneumococcal and flu (influenza) shots.  Keep all follow-up visits as told by your doctor. This is important. Contact a doctor if:  You gain weight quickly.  You have increasing shortness of breath.  You cannot do your normal activities.  You get tired easily.  You cough a lot.  You don't feel like eating or feel like you may vomit (nauseous).  You become puffy (swell) in your hands, feet, ankles, or belly (abdomen).  You cannot sleep well because it is hard to breathe.  You feel like your heart is beating fast (palpitations).  You get dizzy when you stand up. Get help right away if:  You have trouble breathing.  You or someone else notices a change in your behavior, such as having trouble staying awake.  You have chest pain or discomfort.  You pass out (faint). These symptoms may be an emergency. Do not wait to see if the symptoms will go away. Get medical help right away. Call your local emergency services (911 in the [place] Do not drive yourself to the hospital. Summary  Heart failure is a serious condition. To care for yourself, you may have to change your diet, take medicines, and make other lifestyle changes.  Take your medicines every day.  Do not stop taking them unless your doctor tells you to do so.  Eat heart-healthy foods, such as fresh or frozen fruits and vegetables, fish, lean meats, legumes, fat-free or low-fat dairy products, and whole-grain or high-fiber foods.  Ask your doctor if you can drink alcohol. You may have to stop alcohol use if you have very bad heart failure.  Contact your doctor if you gain weight quickly or feel that your heart is beating too fast. Get help right away if you pass out, or have chest pain or trouble breathing. This information is not intended to replace advice given to you by your health care provider.  Make sure you discuss any questions you have with your health care provider. Document Released: 10/01/2018 Document Revised: 09/30/2018 Document Reviewed: 10/01/2018 Elsevier Patient Education  [place].  Heart Failure Action Plan A heart failure action plan helps you understand what to do when you have symptoms of heart failure. Follow the plan that was created by you and your health care provider. Review your plan each time you visit your health care provider. Red zone These signs and symptoms mean you should get medical help right away:  You have trouble breathing when resting.  You have a dry cough that is getting worse.  You have swelling or pain in your legs or abdomen that is getting worse.  You suddenly gain more than 2-3 lb (0.9-1.4 kg) in a day, or more than 5 lb (2.3 kg) in one week. This amount may be more or less depending on your condition.  You have trouble staying awake or you feel confused.  You have chest pain.  You do not have an appetite.  You pass out. If you experience any of these symptoms:  Call your local emergency services (911 in the [place] right away or seek help at the emergency department of the nearest hospital. Yellow zone These signs and symptoms mean your condition may be getting worse and you should make some changes:  You have trouble  breathing when you are active or you need to sleep with extra pillows.  You have swelling in your legs or abdomen.  You gain 2-3 lb (0.9-1.4 kg) in one day, or 5 lb (2.3 kg) in one week. This amount may be more or less depending on your condition.  You get tired easily.  You have trouble sleeping.  You have a dry cough. If you experience any of these symptoms:  Contact your health care provider within the next day.  Your health care provider may adjust your medicines. Green zone These signs mean you are doing well and can continue what you are doing:  You do not have shortness of breath.  You have very little swelling or no new swelling.  Your weight is stable (no gain or loss).  You have a normal activity level.  You do not have chest pain or any other new symptoms. Follow these instructions at home:  Take over-the-counter and prescription medicines only as told by your health care provider.  Weigh yourself daily. Your target weight is __________ lb (__________ kg). ? Call your health care provider if you gain more than __________ lb (__________ kg) in a day, or more than __________ lb (__________ kg) in one week.  Eat a heart-healthy diet. Work with a diet and nutrition specialist (dietitian) to create an eating plan that is best for you.  Keep all follow-up visits as told by your health care provider. This is important. Where to find more information  American Heart Association: www.heart.org Summary  Follow the action plan that was created by you and your health care provider.  Get help right away if you have any symptoms in the Red zone. This information is not intended to replace advice given to you by your health care provider. Make sure you discuss any questions you have with your health care provider. Document Released: 07/28/2016 Document Revised: 05/31/2017 Document Reviewed: 07/28/2016 Elsevier Patient Education  2020 [employer].

## 2019-05-22 LAB — BASIC METABOLIC PANEL WITH GFR
BUN/Creatinine Ratio: 16 (ref 9–20)
BUN: 12 mg/dL (ref 6–24)
CO2: 30 mmol/L — ABNORMAL HIGH (ref 20–29)
Calcium: 9.2 mg/dL (ref 8.7–10.2)
Chloride: 97 mmol/L (ref 96–106)
Creatinine, Ser: 0.75 mg/dL — ABNORMAL LOW (ref 0.76–1.27)
GFR calc Af Amer: 116 mL/min/1.73
GFR calc non Af Amer: 100 mL/min/1.73
Glucose: 117 mg/dL — ABNORMAL HIGH (ref 65–99)
Potassium: 4.6 mmol/L (ref 3.5–5.2)
Sodium: 142 mmol/L (ref 134–144)

## 2019-06-04 MED FILL — ALBUTEROL SULFATE HFA 108 (: 108 (90 BAS | 25 days supply | Qty: 9 | Fill #0

## 2019-06-04 MED FILL — INCRUSE ELLIPTA 62.5 MCG IN: 62.5 | 30 days supply | Qty: 30 | Fill #0

## 2019-06-04 MED FILL — FUROSEMIDE 20 MG TABS: 20 | 30 days supply | Qty: 30 | Fill #0

## 2019-06-04 MED FILL — POTASSIUM CL ER 20 MEQ TAB: 20 | 30 days supply | Qty: 30 | Fill #0

## 2019-06-09 ENCOUNTER — Encounter: Payer: Self-pay | Admitting: Critical Care Medicine

## 2019-06-09 ENCOUNTER — Other Ambulatory Visit: Payer: Self-pay

## 2019-06-09 ENCOUNTER — Ambulatory Visit: Payer: Self-pay | Attending: Critical Care Medicine | Admitting: Critical Care Medicine

## 2019-06-09 DIAGNOSIS — I2723 Pulmonary hypertension due to lung diseases and hypoxia: Secondary | ICD-10-CM

## 2019-06-09 DIAGNOSIS — F17210 Nicotine dependence, cigarettes, uncomplicated: Secondary | ICD-10-CM

## 2019-06-09 DIAGNOSIS — I2781 Cor pulmonale (chronic): Secondary | ICD-10-CM

## 2019-06-09 DIAGNOSIS — I5032 Chronic diastolic (congestive) heart failure: Secondary | ICD-10-CM

## 2019-06-09 DIAGNOSIS — J9611 Chronic respiratory failure with hypoxia: Secondary | ICD-10-CM

## 2019-06-09 DIAGNOSIS — J449 Chronic obstructive pulmonary disease, unspecified: Secondary | ICD-10-CM

## 2019-06-09 NOTE — Progress Notes (Signed)
Subjective:    Patient ID: Anthony Nelson, male    DOB: 1961/01/02, 58 y.o.   MRN: [id number] Virtual Visit via Telephone Note  I connected with Kimberlee Nearing on 06/09/19 at  2:00 PM EST by telephone and verified that I am speaking with the correct person using two identifiers.   Consent:  I discussed the limitations, risks, security and privacy concerns of performing an evaluation and management service by telephone and the availability of in person appointments. I also discussed with the patient that there may be a patient responsible charge related to this service. The patient expressed understanding and agreed to proceed.  Location of patient: the pt was at home  Location of provider: I was in my office  Persons participating in the televisit with the patient.   No one else on the call     History of Present Illness:   This is a 58 year old male with gold stage D COPD on home oxygen.  The patient's been followed over time by the pulmonary clinic with Dr. Shyrl Numbers.  His last visit was in 2019.  The patient is still actively smoking at this time.  The patient has been on a variety of inhalers but currently takes Incruse daily and Symbicort 160  twoinhalations twice daily.  Patient states overall his level of dyspnea is stable he states he has minimal cough.  He is not had recent exacerbations.  He does state when he ran out of his inhalers and was under a lot of stress he started smoking again his breathing worsened.  He is trying to go back to the nicotine patches and focusing on smoking cessation.  He does not have excess secretions at this time.  Pulmonary functions done in 2019 revealed FEV1 of less than 30% predicted consistent with gold stage D COPD  Note the patient also has pulmonary hypertension and cor pulmonale on the basis of the patient's COPD  The patient is on oxygen 2 L continuous at home   Shortness of Breath This is a chronic problem. The current episode started more than  1 year ago. The problem occurs daily. The problem has been gradually improving. Associated symptoms include wheezing. Pertinent negatives include no chest pain, claudication, fever, PND or sputum production. Risk factors include smoking. He has tried beta agonist inhalers, ipratropium inhalers and steroid inhalers for the symptoms. The treatment provided significant relief. His past medical history is significant for COPD.    Past Medical History:  Diagnosis Date  . Bronchitis   . COPD (chronic obstructive pulmonary disease) ([place])   . Diastolic heart failure ([place])   . GERD (gastroesophageal reflux disease)   . Pulmonary hypertension ([place])   . 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 ([place]) 04/10/2018  . Respiratory failure with hypoxia (HCC)      Family History  Problem Relation Age of Onset  . Cancer Mother   . Cancer Father   . CAD Father   . Heart failure Father   . Diabetes Maternal Grandmother   . Kidney disease Maternal Grandmother   . Kidney cancer Paternal Grandmother   . Heart attack Paternal Grandfather      Social History   Socioeconomic History  . Marital status: Married    Spouse name: Not on file  . Number of children: Not on file  . Years of education: Not on file  . Highest education level: Not on file  Occupational History  . Not on file  Social Needs  .  Financial resource strain: Not on file  . Food insecurity    Worry: Not on file    Inability: Not on file  . Transportation needs    Medical: Not on file    Non-medical: Not on file  Tobacco Use  . Smoking status: Current Every Day Smoker    Packs/day: 1.00    Years: 44.00    Pack years: 44.00    Types: Cigarettes  . Smokeless tobacco: Never Used  Substance and Sexual Activity  . Alcohol use: Not Currently    Comment: Socially   . Drug use: No  . Sexual activity: Not Currently  Lifestyle  . Physical activity    Days per week: Not on file    Minutes per session: Not  on file  . Stress: Not on file  Relationships  . Social Herbalist on phone: Not on file    Gets together: Not on file    Attends religious service: Not on file    Active member of club or organization: Not on file    Attends meetings of clubs or organizations: Not on file    Relationship status: Not on file  . Intimate partner violence    Fear of current or ex partner: Not on file    Emotionally abused: Not on file    Physically abused: Not on file    Forced sexual activity: Not on file  Other Topics Concern  . Not on file  Social History Narrative  . Not on file     Allergies  Allergen Reactions  . Erythromycin      Outpatient Medications Prior to Visit  Medication Sig Dispense Refill  . albuterol (VENTOLIN HFA) 108 (90 Base) MCG/ACT inhaler Inhale 2 puffs into the lungs every 6 (six) hours as needed for wheezing or shortness of breath. 18 g 11  . budesonide-formoterol (SYMBICORT) 160-4.5 MCG/ACT inhaler Inhale 2 puffs into the lungs 2 (two) times daily.    . famotidine (PEPCID) 20 MG tablet Take 1 tablet (20 mg total) by mouth 2 (two) times daily. 60 tablet 3  . furosemide (LASIX) 20 MG tablet Take 1 tablet (20 mg total) by mouth daily. 30 tablet 3  . nicotine (NICODERM CQ - DOSED IN MG/24 HOURS) 14 mg/24hr patch Place 1 patch (14 mg total) onto the skin daily. 28 patch 0  . OXYGEN 2lpm as needed per pt  [facility]    . potassium chloride SA (KLOR-CON) 20 MEQ tablet Take 1 tablet (20 mEq total) by mouth daily. 30 tablet 3  . umeclidinium bromide (INCRUSE ELLIPTA) 62.5 MCG/INH AEPB Inhale 1 puff into the lungs daily. 1 each 11  . docusate sodium (COLACE) 100 MG capsule Take 1 capsule (100 mg total) by mouth daily. (Patient not taking: Reported on 06/09/2019) 10 capsule 0  . Glycopyrrolate-Formoterol (BEVESPI AEROSPHERE) 9-4.8 MCG/ACT AERO Inhale 2 puffs into the lungs 2 (two) times daily. (Patient not taking: Reported on 06/09/2019) 1 Inhaler 11   No facility-administered  medications prior to visit.      Review of Systems  Constitutional: Negative for fever.  Respiratory: Positive for shortness of breath and wheezing. Negative for sputum production.   Cardiovascular: Negative for chest pain, claudication and PND.       Objective:   Physical Exam This is a telephone note there is no exam  Lung functions are reviewed in the [facility] link system  Chest x-ray from October 2020 showed borderline cardiomegaly evidence of pulmonary hypertension  no acute process seen     Assessment & Plan:  I personally reviewed all images and lab data in the [facility] system as well as any outside material available during this office visit and agree with the  radiology impressions.   COPD GOLD IV still smoking Gold stage D COPD with compliance concerns over lack of access some of the inhalers but currently the patient is on Symbicort 2 inhalations twice daily and Incruse 1 puff daily  We will ensure there are refills on these medications to our pharmacy  Continue oxygen therapy  Chronic respiratory failure with hypoxia (HCC) Chronic respiratory failure with hypoxemia   Rayjon was seen today for copd.  Diagnoses and all orders for this visit:  COPD GOLD IV still smoking  Chronic respiratory failure with hypoxia ([place])      Follow Up Instructions: The patient knows will be in office exam December 23  I discussed the assessment and treatment plan with the patient. The patient was provided an opportunity to ask questions and all were answered. The patient agreed with the plan and demonstrated an understanding of the instructions.   The patient was advised to call back or seek an in-person evaluation if the symptoms worsen or if the condition fails to improve as anticipated.  I provided 30 minutes of non-face-to-face time during this encounter  including  median intraservice time , review of notes, labs, imaging, medications  and explaining diagnosis and  management to the patient .    Asencion Noble, MD

## 2019-06-09 NOTE — Assessment & Plan Note (Signed)
Gold stage D COPD with compliance concerns over lack of access some of the inhalers but currently the patient is on Symbicort 2 inhalations twice daily and Incruse 1 puff daily  We will ensure there are refills on these medications to our pharmacy  Continue oxygen therapy

## 2019-06-09 NOTE — Assessment & Plan Note (Signed)
Chronic respiratory failure with hypoxemia

## 2019-06-24 ENCOUNTER — Ambulatory Visit: Payer: Self-pay | Attending: Critical Care Medicine | Admitting: Critical Care Medicine

## 2019-06-24 ENCOUNTER — Encounter: Payer: Self-pay | Admitting: Critical Care Medicine

## 2019-06-24 ENCOUNTER — Ambulatory Visit: Payer: Self-pay | Admitting: Family Medicine

## 2019-06-24 VITALS — BP 99/68 | HR 97 | Temp 98.0°F | Ht 72.0 in | Wt 121.0 lb

## 2019-06-24 DIAGNOSIS — I2723 Pulmonary hypertension due to lung diseases and hypoxia: Secondary | ICD-10-CM

## 2019-06-24 DIAGNOSIS — J441 Chronic obstructive pulmonary disease with (acute) exacerbation: Secondary | ICD-10-CM

## 2019-06-24 DIAGNOSIS — J9611 Chronic respiratory failure with hypoxia: Secondary | ICD-10-CM

## 2019-06-24 DIAGNOSIS — I2781 Cor pulmonale (chronic): Secondary | ICD-10-CM

## 2019-06-24 DIAGNOSIS — I5032 Chronic diastolic (congestive) heart failure: Secondary | ICD-10-CM

## 2019-06-24 DIAGNOSIS — F17210 Nicotine dependence, cigarettes, uncomplicated: Secondary | ICD-10-CM

## 2019-06-24 DIAGNOSIS — K219 Gastro-esophageal reflux disease without esophagitis: Secondary | ICD-10-CM

## 2019-06-24 DIAGNOSIS — J449 Chronic obstructive pulmonary disease, unspecified: Secondary | ICD-10-CM

## 2019-06-24 MED ORDER — PREDNISONE 10 MG PO TABS: ORAL_TABLET | ORAL | 0 refills | Status: DC

## 2019-06-24 MED ORDER — FUROSEMIDE 20 MG PO TABS: 20.0000 mg | ORAL_TABLET | Freq: Every day | ORAL | 3 refills | Status: DC

## 2019-06-24 MED ORDER — BUDESONIDE-FORMOTEROL FUMARATE 160-4.5 MCG/ACT IN AERO: 2.0000 | INHALATION_SPRAY | Freq: Two times a day (BID) | RESPIRATORY_TRACT | 11 refills | Status: DC

## 2019-06-24 MED ORDER — NICOTINE POLACRILEX 2 MG MT LOZG: LOZENGE | OROMUCOSAL | 2 refills | Status: DC

## 2019-06-24 MED ORDER — POTASSIUM CHLORIDE CRYS ER 20 MEQ PO TBCR: 20.0000 meq | EXTENDED_RELEASE_TABLET | Freq: Every day | ORAL | 3 refills | Status: DC

## 2019-06-24 MED ORDER — FAMOTIDINE 20 MG PO TABS: 20.0000 mg | ORAL_TABLET | Freq: Two times a day (BID) | ORAL | 3 refills | Status: DC

## 2019-06-24 MED ORDER — AZITHROMYCIN 250 MG PO TABS: 250.0000 mg | ORAL_TABLET | Freq: Every day | ORAL | 0 refills | Status: DC

## 2019-06-24 MED FILL — AZITHROMYCIN 250 MG TABLET: 250 | 5 days supply | Qty: 6 | Fill #0

## 2019-06-24 MED FILL — predniSONE 10 MG TABS: 10 | 5 days supply | Qty: 20 | Fill #0

## 2019-06-24 MED FILL — POTASSIUM CL ER 20 MEQ TAB: 20 | 30 days supply | Qty: 30 | Fill #0

## 2019-06-24 MED FILL — SYMBICORT 160-4.5 MCG INH: 160-4.5 | 30 days supply | Qty: 10 | Fill #0

## 2019-06-24 MED FILL — NICOTINE 2 MG LOZENGE: 2 | 72 days supply | Qty: 72 | Fill #0

## 2019-06-24 MED FILL — FUROSEMIDE 20 MG TABS: 20 | 30 days supply | Qty: 30 | Fill #0

## 2019-06-24 NOTE — Assessment & Plan Note (Signed)
I spent about 5 minutes going over smoking cessation techniques and recommended the Nicorette ret lozenge at a 2 mg dose 3 times daily and I sent a prescription to our pharmacy for this

## 2019-06-24 NOTE — Progress Notes (Signed)
Subjective:    Patient ID: Anthony Nelson, male    DOB: [birth year], 59 y.o.   MRN: [id number]   This is a 58 year old male with gold stage D COPD on home oxygen.  The patient's been followed over time by the pulmonary clinic with Dr. Shyrl Numbers.  His last visit was in 2019.  The patient is still actively smoking at this time.  The patient has been on a variety of inhalers but currently takes Incruse daily and Symbicort 160  twoinhalations twice daily.  Patient states overall his level of dyspnea is stable he states he has minimal cough.  He is not had recent exacerbations.  He does state when he ran out of his inhalers and was under a lot of stress he started smoking again his breathing worsened.  He is trying to go back to the nicotine patches and focusing on smoking cessation.  He does not have excess secretions at this time.  Pulmonary functions done in 2019 revealed FEV1 of less than 30% predicted consistent with gold stage D COPD  Note the patient also has pulmonary hypertension and cor pulmonale on the basis of the patient's COPD  The patient is on oxygen 2 L continuous at home   06/24/2019 This is the first in office visit as the last visit was a telephone visit.  This patient has gold stage IV COPD still actively smoking.  He has had issues with access to some of the inhalers however we were able to achieve at the last telephone visit patient assistance for Symbicort 160 and Incruse and is on both these medicines at this time.  He also maintains oxygen 2 L continuous.  Note he had been hospitalized in October.  During the October admission he had significant fluid retention and evidence of pulmonary hypertension with left ventricular hypertrophy  Today he notes he has increased weight and increased swelling in the feet and has not been using his furosemide on a regular basis.  He is under a great deal stress because in April of this year his son was apparently murdered and he and his wife both of  been in significant depression over this  He comes in today with oxygen saturation of 82% walking from the lobby into the exam room on 2 L  He is still smoking 3 to 4 cigarettes daily Review shortness of breath assessment below  Shortness of Breath This is a chronic problem. The current episode started more than 1 year ago. The problem occurs daily. The problem has been gradually improving. Associated symptoms include leg swelling, sputum production and wheezing. Pertinent negatives include no chest pain, claudication, fever, headaches, hemoptysis, PND, rhinorrhea or sore throat. The symptoms are aggravated by any activity, weather changes and lying flat. Associated symptoms comments: Mucus is dark.  Cloudy . Risk factors include smoking. He has tried beta agonist inhalers, ipratropium inhalers and steroid inhalers for the symptoms. The treatment provided significant relief. His past medical history is significant for COPD.    Past Medical History:  Diagnosis Date  . Bronchitis   . COPD (chronic obstructive pulmonary disease) ([place])   . Diastolic heart failure ([place])   . GERD (gastroesophageal reflux disease)   . Pulmonary hypertension ([place])   . 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 ([place]) 04/10/2018  . Respiratory failure with hypoxia (HCC)      Family History  Problem Relation Age of Onset  . Cancer Mother   . Cancer Father   . CAD  Father   . Heart failure Father   . Diabetes Maternal Grandmother   . Kidney disease Maternal Grandmother   . Kidney cancer Paternal Grandmother   . Heart attack Paternal Grandfather      Social History   Socioeconomic History  . Marital status: Married    Spouse name: Not on file  . Number of children: Not on file  . Years of education: Not on file  . Highest education level: Not on file  Occupational History  . Not on file  Tobacco Use  . Smoking status: Current Every Day Smoker    Packs/day: 1.00    Years:  44.00    Pack years: 44.00    Types: Cigarettes  . Smokeless tobacco: Never Used  Substance and Sexual Activity  . Alcohol use: Not Currently    Comment: Socially   . Drug use: No  . Sexual activity: Not Currently  Other Topics Concern  . Not on file  Social History Narrative  . Not on file   Social Determinants of Health   Financial Resource Strain:   . Difficulty of Paying Living Expenses: Not on file  Food Insecurity:   . Worried About Charity fundraiser in the Last Year: Not on file  . Ran Out of Food in the Last Year: Not on file  Transportation Needs:   . Lack of Transportation (Medical): Not on file  . Lack of Transportation (Non-Medical): Not on file  Physical Activity:   . Days of Exercise per Week: Not on file  . Minutes of Exercise per Session: Not on file  Stress:   . Feeling of Stress : Not on file  Social Connections:   . Frequency of Communication with Friends and Family: Not on file  . Frequency of Social Gatherings with Friends and Family: Not on file  . Attends Religious Services: Not on file  . Active Member of Clubs or Organizations: Not on file  . Attends Archivist Meetings: Not on file  . Marital Status: Not on file  Intimate Partner Violence:   . Fear of Current or Ex-Partner: Not on file  . Emotionally Abused: Not on file  . Physically Abused: Not on file  . Sexually Abused: Not on file     Allergies  Allergen Reactions  . Erythromycin      Outpatient Medications Prior to Visit  Medication Sig Dispense Refill  . albuterol (VENTOLIN HFA) 108 (90 Base) MCG/ACT inhaler Inhale 2 puffs into the lungs every 6 (six) hours as needed for wheezing or shortness of breath. 18 g 11  . OXYGEN 2lpm as needed per pt  [facility]    . umeclidinium bromide (INCRUSE ELLIPTA) 62.5 MCG/INH AEPB Inhale 1 puff into the lungs daily. 1 each 11  . budesonide-formoterol (SYMBICORT) 160-4.5 MCG/ACT inhaler Inhale 2 puffs into the lungs 2 (two) times daily.    .  famotidine (PEPCID) 20 MG tablet Take 1 tablet (20 mg total) by mouth 2 (two) times daily. 60 tablet 3  . furosemide (LASIX) 20 MG tablet Take 1 tablet (20 mg total) by mouth daily. 30 tablet 3  . potassium chloride SA (KLOR-CON) 20 MEQ tablet Take 1 tablet (20 mEq total) by mouth daily. 30 tablet 3  . docusate sodium (COLACE) 100 MG capsule Take 1 capsule (100 mg total) by mouth daily. (Patient not taking: Reported on 06/09/2019) 10 capsule 0  . Glycopyrrolate-Formoterol (BEVESPI AEROSPHERE) 9-4.8 MCG/ACT AERO Inhale 2 puffs into the lungs 2 (two)  times daily. (Patient not taking: Reported on 06/09/2019) 1 Inhaler 11  . nicotine (NICODERM CQ - DOSED IN MG/24 HOURS) 14 mg/24hr patch Place 1 patch (14 mg total) onto the skin daily. (Patient not taking: Reported on 06/24/2019) 28 patch 0   No facility-administered medications prior to visit.     Review of Systems  Constitutional: Negative for fever.  HENT: Negative for rhinorrhea and sore throat.   Respiratory: Positive for sputum production, shortness of breath and wheezing. Negative for hemoptysis.   Cardiovascular: Positive for leg swelling. Negative for chest pain, claudication and PND.  Neurological: Negative for headaches.       Objective:   Physical Exam     Vitals:   06/24/19 1124  BP: 99/68  Pulse: 97  Temp: 98 F (36.7 C)  TempSrc: Oral  SpO2: (!) 82%  Weight: 121 lb (54.9 kg)  Height: 6' (1.829 m)    Gen: Pleasant, thin, in no distress,  normal affect  ENT: No lesions,  mouth clear,  oropharynx clear, no postnasal drip  Neck: No JVD, no TMG, no carotid bruits  Lungs: No use of accessory muscles, no dullness to percussion, distant breath sounds with hyperresonance to percussion  Cardiovascular: RRR, heart sounds normal, no murmur or gallops, 2+ peripheral edema  Abdomen: soft and NT, no HSM,  BS normal  Musculoskeletal: No deformities, no cyanosis or clubbing  Neuro: alert, non focal  Skin: Warm, no lesions  or rashes   Assessment & Plan:  I personally reviewed all images and lab data in the [facility] system as well as any outside material available during this office visit and agree with the  radiology impressions.   COPD GOLD IV still smoking COPD Gold stage D  Patient now on patient assistance with Symbicort 160 at 10 elations twice daily and Incruse 1 puff daily  Oxygen will be increased to 2 L rest 3 to 4 L exertion  Me to go ahead and give the patient azithromycin and pulse dose of prednisone for mild exacerbation  COPD exacerbation (HCC) Per COPD assessment prednisone and antibiotics given for exacerbation at this visit  Chronic diastolic heart failure (HCC) Chronic diastolic heart failure exacerbated by pulmonary hypertension due to cor pulmonale  I have requested the patient uses furosemide and potassium on a daily basis  Increasing his oxygen prescription will also assist with fluid removal in this patient with chronic hypoxemia  Chronic respiratory failure with hypoxia (HCC) Review COPD assessment  Cigarette smoker I spent about 5 minutes going over smoking cessation techniques and recommended the Nicorette ret lozenge at a 2 mg dose 3 times daily and I sent a prescription to our pharmacy for this   Jimmy was seen today for copd.  Diagnoses and all orders for this visit:  COPD exacerbation ([place])  Chronic obstructive pulmonary disease, unspecified COPD type ([place]) -     CBC with Differential/Platelet; Future -     CBC with Differential/Platelet  Gastroesophageal reflux disease without esophagitis -     famotidine (PEPCID) 20 MG tablet; Take 1 tablet (20 mg total) by mouth 2 (two) times daily.  Chronic diastolic heart failure (HCC) -     furosemide (LASIX) 20 MG tablet; Take 1 tablet (20 mg total) by mouth daily. -     potassium chloride SA (KLOR-CON) 20 MEQ tablet; Take 1 tablet (20 mEq total) by mouth daily. -     Basic Metabolic Panel  Pulmonary hypertension due to  chronic obstructive pulmonary disease (HCC)  Cor pulmonale (chronic) (HCC)  COPD GOLD IV still smoking  Chronic respiratory failure with hypoxia (HCC)  Cigarette smoker  Other orders -     budesonide-formoterol (SYMBICORT) 160-4.5 MCG/ACT inhaler; Inhale 2 puffs into the lungs 2 (two) times daily. -     predniSONE (DELTASONE) 10 MG tablet; Take 4 tablets daily for 5 days then stop -     azithromycin (ZITHROMAX) 250 MG tablet; Take 1 tablet (250 mg total) by mouth daily. Take two once then one daily until gone -     nicotine polacrilex (NICORETTE MINI) 2 MG lozenge; Use 3-4 times daily to quit smoking  Tetanus vaccine was given at this visit

## 2019-06-24 NOTE — Assessment & Plan Note (Signed)
Chronic diastolic heart failure exacerbated by pulmonary hypertension due to cor pulmonale  I have requested the patient uses furosemide and potassium on a daily basis  Increasing his oxygen prescription will also assist with fluid removal in this patient with chronic hypoxemia

## 2019-06-24 NOTE — Assessment & Plan Note (Signed)
Per COPD assessment prednisone and antibiotics given for exacerbation at this visit

## 2019-06-24 NOTE — Patient Instructions (Addendum)
Refills on all your medications were sent to our pharmacy  Begin prednisone 10 mg strength take 4 daily for 5 days  Begin azithromycin take 2 the first day then 1 daily till gone  Both prednisone and azithromycin is for bronchitis  Increase your oxygen to: 2L rest,  3-4 liters exertion  Focus on smoking cessation and use the nicotine lozenges we sent to our pharmacy  Labs today: metabolic panel and blood counts  A tetanus vaccine was given  Our licensed clinical social worker will connect with you  Return in follow-up see Dr. [name] 1 month

## 2019-06-24 NOTE — Assessment & Plan Note (Signed)
COPD Gold stage D  Patient now on patient assistance with Symbicort 160 at 10 elations twice daily and Incruse 1 puff daily  Oxygen will be increased to 2 L rest 3 to 4 L exertion  Me to go ahead and give the patient azithromycin and pulse dose of prednisone for mild exacerbation

## 2019-06-24 NOTE — Assessment & Plan Note (Signed)
Review COPD assessment

## 2019-06-25 LAB — CBC WITH DIFFERENTIAL/PLATELET
Basophils Absolute: 0 10*3/uL (ref 0.0–0.2)
Basos: 1 %
EOS (ABSOLUTE): 0.1 10*3/uL (ref 0.0–0.4)
Eos: 1 %
Hematocrit: 48.6 % (ref 37.5–51.0)
Hemoglobin: 17.1 g/dL (ref 13.0–17.7)
Immature Grans (Abs): 0 10*3/uL (ref 0.0–0.1)
Immature Granulocytes: 0 %
Lymphocytes Absolute: 1 10*3/uL (ref 0.7–3.1)
Lymphs: 16 %
MCH: 33.7 pg — ABNORMAL HIGH (ref 26.6–33.0)
MCHC: 35.2 g/dL (ref 31.5–35.7)
MCV: 96 fL (ref 79–97)
Monocytes Absolute: 0.5 10*3/uL (ref 0.1–0.9)
Monocytes: 8 %
Neutrophils Absolute: 4.4 10*3/uL (ref 1.4–7.0)
Neutrophils: 74 %
Platelets: 152 10*3/uL (ref 150–450)
RBC: 5.07 x10E6/uL (ref 4.14–5.80)
RDW: 13.2 % (ref 11.6–15.4)
WBC: 5.9 10*3/uL (ref 3.4–10.8)

## 2019-06-25 LAB — BASIC METABOLIC PANEL
BUN/Creatinine Ratio: 19 (ref 9–20)
BUN: 20 mg/dL (ref 6–24)
CO2: 39 mmol/L — ABNORMAL HIGH (ref 20–29)
Calcium: 9.1 mg/dL (ref 8.7–10.2)
Chloride: 90 mmol/L — ABNORMAL LOW (ref 96–106)
Creatinine, Ser: 1.07 mg/dL (ref 0.76–1.27)
GFR calc Af Amer: 87 mL/min/{1.73_m2} (ref >59)
GFR calc non Af Amer: 76 mL/min/{1.73_m2} (ref >59)
Glucose: 71 mg/dL (ref 65–99)
Potassium: 3.8 mmol/L (ref 3.5–5.2)
Sodium: 144 mmol/L (ref 134–144)

## 2019-06-30 ENCOUNTER — Telehealth: Payer: Self-pay | Admitting: Family Medicine

## 2019-06-30 NOTE — Telephone Encounter (Signed)
No neb solutions are on this patient's profile. Will forward to Dr. [name].

## 2019-06-30 NOTE — Telephone Encounter (Signed)
Patient called saying he has a nebulizer machine but needs both solutions that go in it. Please f/u

## 2019-07-01 MED ORDER — ALBUTEROL SULFATE (2.5 MG/3ML) 0.083% IN NEBU: 2.5000 mg | INHALATION_SOLUTION | Freq: Four times a day (QID) | RESPIRATORY_TRACT | 12 refills | Status: DC | PRN

## 2019-07-01 MED FILL — ALBUTEROL SUL 2.5 MG/3 ML S: (2.5 MG/3ML | 6 days supply | Qty: 75 | Fill #0

## 2019-07-01 MED FILL — INCRUSE ELLIPTA 62.5 MCG IN: 62.5 | 30 days supply | Qty: 30 | Fill #1

## 2019-07-01 NOTE — Telephone Encounter (Signed)
Stay on both inhalers and will send albuterol neb med for nebulizer

## 2019-07-01 NOTE — Addendum Note (Signed)
Addended by: Asencion Noble E on: 07/01/2019 01:15 PM   Modules accepted: Orders

## 2019-07-22 ENCOUNTER — Ambulatory Visit: Payer: Self-pay | Admitting: Critical Care Medicine

## 2019-07-31 MED FILL — ?ALBUTEROL SUL 2.5 MG/3 MLS: (2.5 MG/3ML | 14 days supply | Qty: 180 | Fill #1

## 2019-07-31 MED FILL — !INCRUSE ELLIPTA 62.5 MCG I: 62.5 | 30 days supply | Qty: 30 | Fill #2

## 2019-07-31 MED FILL — SYMBICORT 160-4.5 MCG INH: 160-4.5 | 30 days supply | Qty: 10 | Fill #1

## 2019-07-31 MED FILL — ALBUTEROL SULFATE HFA 108 (: 108 (90 BAS | 25 days supply | Qty: 9 | Fill #1

## 2019-07-31 MED FILL — FAMOTIDINE 20 MG TABS: 20 | 30 days supply | Qty: 60 | Fill #0

## 2019-08-06 IMAGING — CT CT ANGIO CHEST
2 of 6 series · 18 of 46 positions shown · IV contrast (ISOVUE)
Comparison: Chest x-ray earlier today.

CLINICAL DATA: Fatigue, shortness of breath, hypoxia and bilateral
lower extremity edema.

EXAM:
CT ANGIOGRAPHY CHEST WITH CONTRAST
TECHNIQUE: Multidetector CT imaging of the chest was performed using the
standard protocol during bolus administration of intravenous
contrast. Multiplanar CT image reconstructions and MIPs were
obtained to evaluate the vascular anatomy.
CONTRAST:  100mL 3IFO1A-BM3 IOPAMIDOL (3IFO1A-BM3) INJECTION 76%

[Series 5: thins · axial · 0.68mm/px · z∈[-161,+182]mm · 15 of 377 slices shown]
[im 17/377  lung]
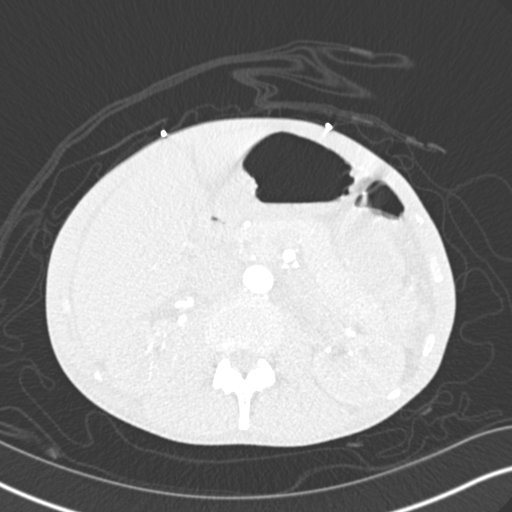
[im 50/377  soft-tissue]
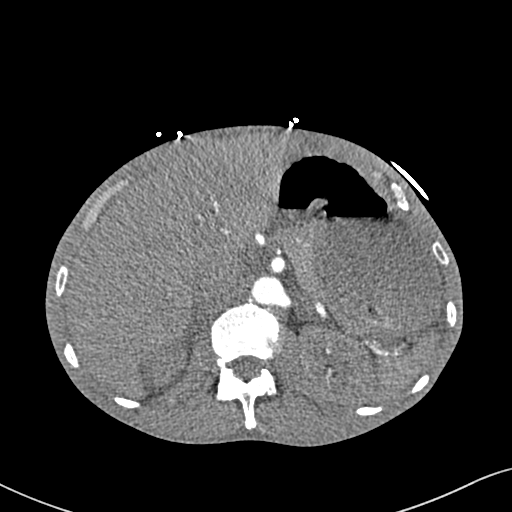
[im 66/377  lung]
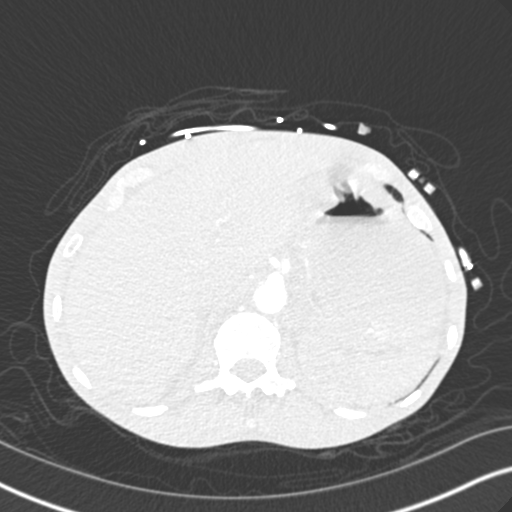
[im 99/377  soft-tissue]
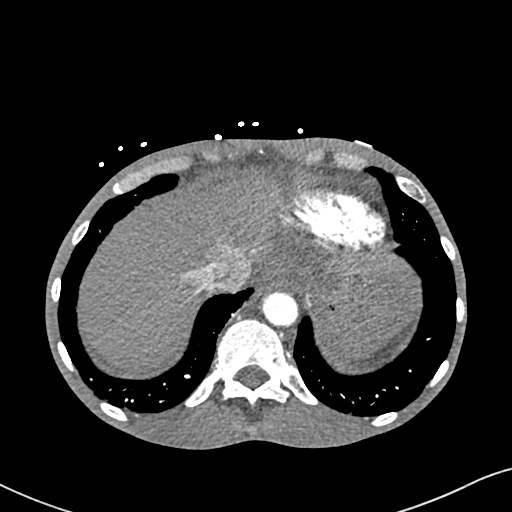
[im 115/377  lung]
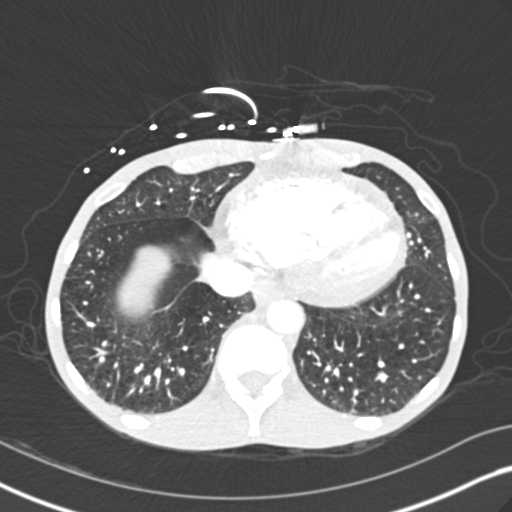
[im 148/377  soft-tissue]
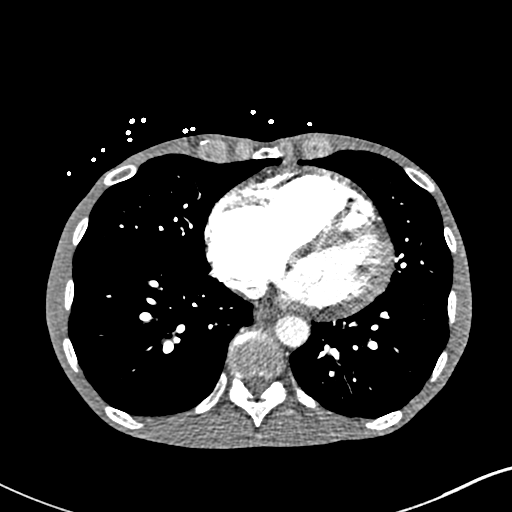
[im 164/377  lung]
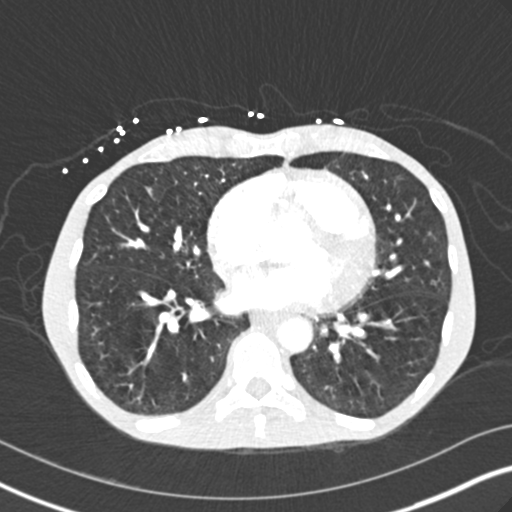
[im 197/377  soft-tissue]
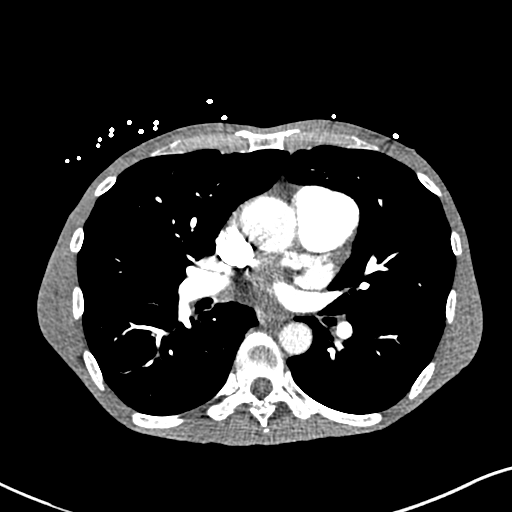
[im 213/377  lung]
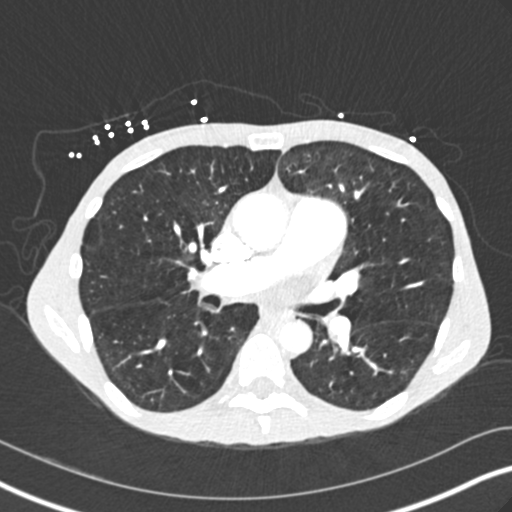
[im 229/377  soft-tissue]
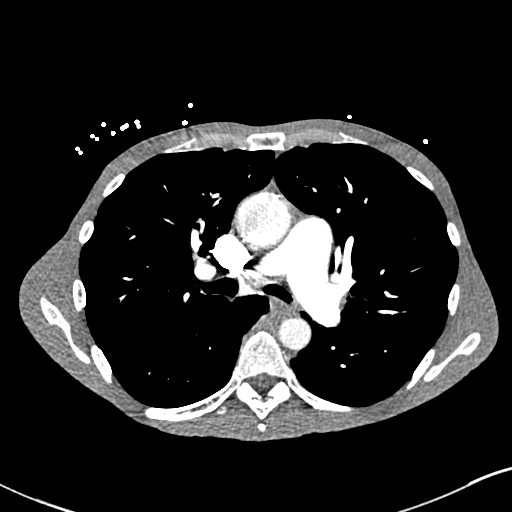
[im 262/377  lung]
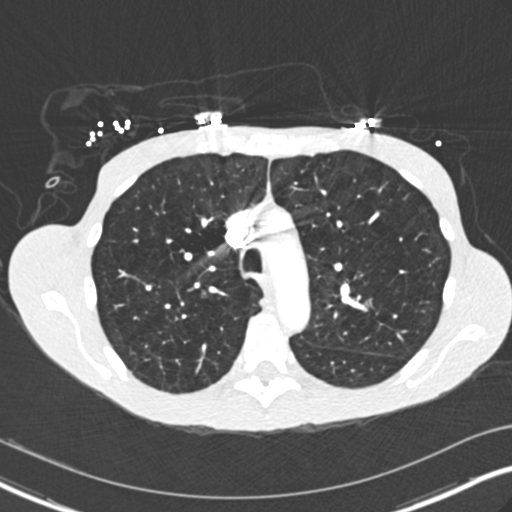
[im 278/377  soft-tissue]
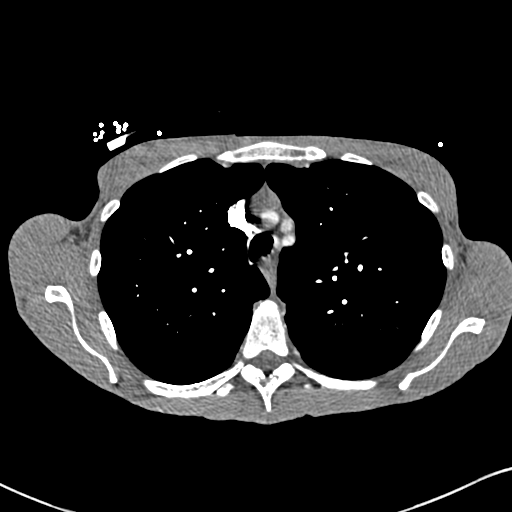
[im 311/377  lung]
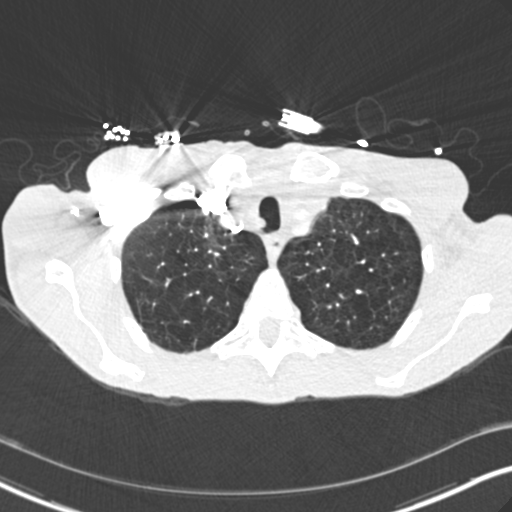
[im 327/377  soft-tissue]
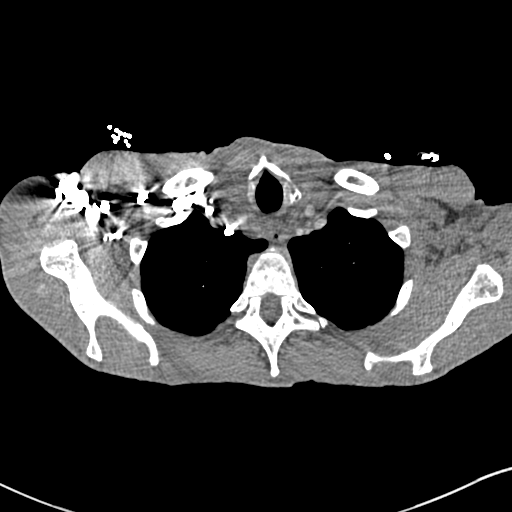
[im 360/377  lung]
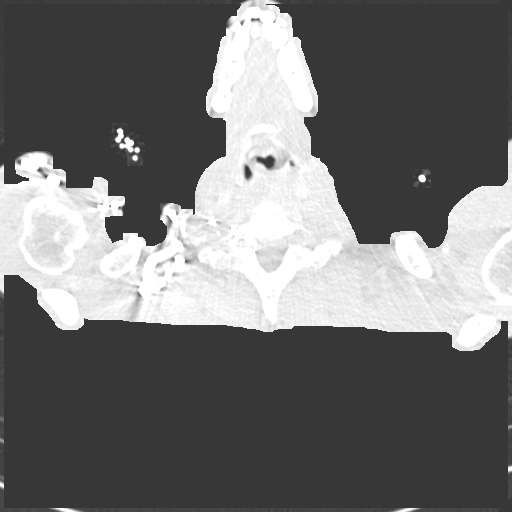

[Series 7: coronal mpr · coronal · 0.62mm/px · 3 of 151 slices shown]
[im 38/151  soft-tissue]
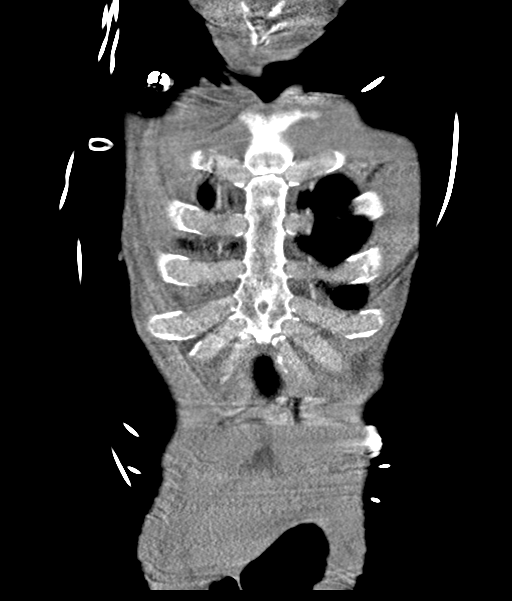
[im 76/151  soft-tissue]
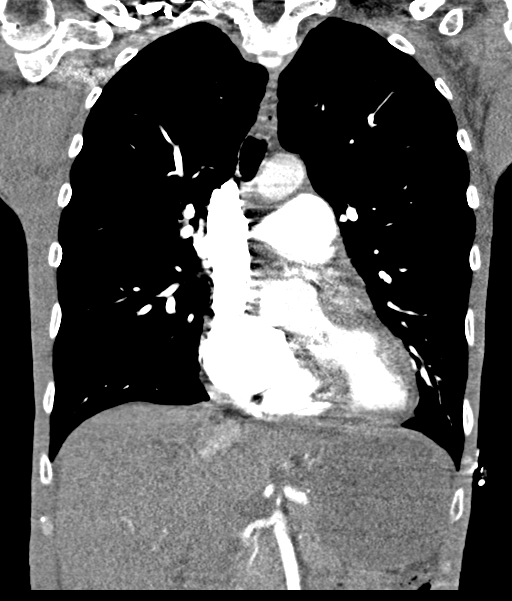
[im 113/151  soft-tissue]
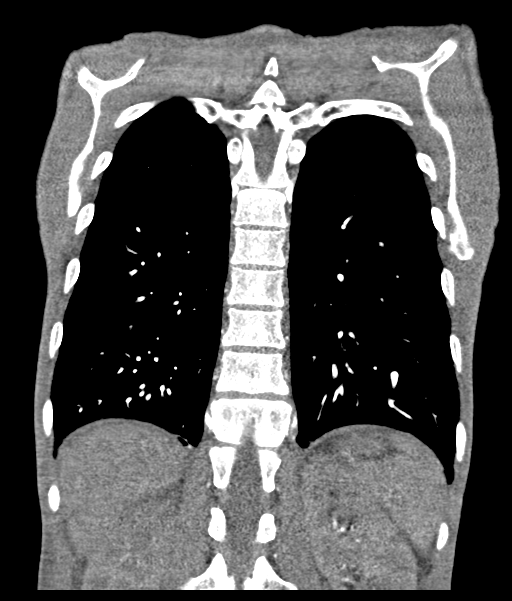

[18 of 46 positions shown; findings below may reference images not displayed]

FINDINGS: Cardiovascular: Satisfactory opacification of the pulmonary arteries
to the segmental level. No evidence of pulmonary embolism. Central
pulmonary arteries are normal in caliber.

Right ventricle and right atrium are dilated and there is some
reflux of contrast into the intrahepatic IVC and central hepatic
veins. Findings are consistent with some component of underlying
right heart failure and elevated right heart pressures. The thoracic
aorta is poorly opacified but shows normal caliber without evidence
of aneurysmal disease. No pericardial fluid identified. No
significant calcified coronary artery plaque identified.

Mediastinum/Nodes: No enlarged mediastinal, hilar, or axillary lymph
nodes. Thyroid gland, trachea, and esophagus demonstrate no
significant findings.

Lungs/Pleura: There is evidence of moderately advanced emphysematous
lung disease throughout both lungs and predominantly in the upper
lung zones. Mild scarring present at both lung bases. There is no
evidence of pulmonary edema, consolidation, pneumothorax, nodule or
pleural fluid.

Upper Abdomen: No acute abnormality.

Musculoskeletal: No chest wall abnormality. No acute or significant
osseous findings.

Review of the MIP images confirms the above findings.
IMPRESSION: 1. No evidence of pulmonary embolism.
2. Evidence of elevated right heart pressures and underlying right
heart failure with dilated right-sided heart chambers and reflux of
contrast into the intrahepatic IVC and central hepatic veins. No
overt airspace edema or pleural fluid.
3. Moderately advanced emphysematous lung disease, predominantly in
the upper lung zones bilaterally. No acute pulmonary findings.

Emphysema ([id number]).

## 2019-08-11 ENCOUNTER — Encounter: Payer: Self-pay | Admitting: Critical Care Medicine

## 2019-08-11 ENCOUNTER — Other Ambulatory Visit: Payer: Self-pay

## 2019-08-11 ENCOUNTER — Ambulatory Visit: Payer: Self-pay | Attending: Critical Care Medicine | Admitting: Critical Care Medicine

## 2019-08-11 VITALS — BP 100/66 | HR 99 | Temp 98.0°F | Resp 18 | Ht 72.0 in | Wt 115.0 lb

## 2019-08-11 DIAGNOSIS — J441 Chronic obstructive pulmonary disease with (acute) exacerbation: Secondary | ICD-10-CM

## 2019-08-11 DIAGNOSIS — R34 Anuria and oliguria: Secondary | ICD-10-CM

## 2019-08-11 DIAGNOSIS — R918 Other nonspecific abnormal finding of lung field: Secondary | ICD-10-CM | POA: Insufficient documentation

## 2019-08-11 DIAGNOSIS — I5032 Chronic diastolic (congestive) heart failure: Secondary | ICD-10-CM

## 2019-08-11 DIAGNOSIS — Z1211 Encounter for screening for malignant neoplasm of colon: Secondary | ICD-10-CM

## 2019-08-11 DIAGNOSIS — J449 Chronic obstructive pulmonary disease, unspecified: Secondary | ICD-10-CM

## 2019-08-11 DIAGNOSIS — R634 Abnormal weight loss: Secondary | ICD-10-CM

## 2019-08-11 DIAGNOSIS — F17210 Nicotine dependence, cigarettes, uncomplicated: Secondary | ICD-10-CM

## 2019-08-11 DIAGNOSIS — J9611 Chronic respiratory failure with hypoxia: Secondary | ICD-10-CM

## 2019-08-11 MED ORDER — PREDNISONE 10 MG PO TABS: ORAL_TABLET | ORAL | 0 refills | Status: DC

## 2019-08-11 MED FILL — ?PREDINSONE 10MG TABLETS: 10 | 5 days supply | Qty: 20 | Fill #0

## 2019-08-11 NOTE — Assessment & Plan Note (Signed)
Gold stage D COPD with FEV1 of 22% of predicted  The is currently on Symbicort and Incruse as he cannot afford the Bevespi inhaler  Even with patient assistance would not be able obtain this medication for him  He does maintain albuterol as well as needed  Plan to be to continue oxygen therapy Symbicort and Incruse and will also give a pulse dose of prednisone

## 2019-08-11 NOTE — Progress Notes (Signed)
Patient states Vespi opens his airways more than the other to. Patient needs assistance with appetite.

## 2019-08-11 NOTE — Assessment & Plan Note (Signed)
See COPD assessment

## 2019-08-11 NOTE — Patient Instructions (Signed)
Labs today include cancer screening blood work, liver function and stool for occult blood  CT scan of the chest will be ordered to look for lung nodules  Continue to focus on smoking cessation  Stay on Symbicort and Incruse refills on your inhalers are at our pharmacy you use need to let them know when you are ready for refills  Prednisone 40 mg a day for 5 days was sent to our pharmacy  Continue nutritional supplements through the day  Return to see Dr. [name] 2 months

## 2019-08-11 NOTE — Assessment & Plan Note (Signed)
With concern of possible lung nodule on chest x-ray will obtain a CT scan of the chest with weight loss

## 2019-08-11 NOTE — Assessment & Plan Note (Signed)
We will check CEA level along with CT scan of the chest and a PSA level

## 2019-08-11 NOTE — Progress Notes (Signed)
Subjective:    Patient ID: Anthony Nelson, male    DOB: January 20, 1961, 59 y.o.   MRN: [id number]   This is a 59 year old male with gold stage D COPD on home oxygen.  The patient's been followed over time by the pulmonary clinic with Dr. Melvyn Novas.  His last visit was in 2019.  The patient is still actively smoking at this time.  The patient has been on a variety of inhalers but currently takes Incruse daily and Symbicort 160  twoinhalations twice daily.  Patient states overall his level of dyspnea is stable he states he has minimal cough.  He is not had recent exacerbations.  He does state when he ran out of his inhalers and was under a lot of stress he started smoking again his breathing worsened.  He is trying to go back to the nicotine patches and focusing on smoking cessation.  He does not have excess secretions at this time.  Pulmonary functions done in 2019 revealed FEV1 of less than 30% predicted consistent with gold stage D COPD  Note the patient also has pulmonary hypertension and cor pulmonale on the basis of the patient's COPD  The patient is on oxygen 2 L continuous at home   06/24/2019 This is the first in office visit as the last visit was a telephone visit.  This patient has gold stage IV COPD still actively smoking.  He has had issues with access to some of the inhalers however we were able to achieve at the last telephone visit patient assistance for Symbicort 160 and Incruse and is on both these medicines at this time.  He also maintains oxygen 2 L continuous.  Note he had been hospitalized in October.  During the October admission he had significant fluid retention and evidence of pulmonary hypertension with left ventricular hypertrophy  Today he notes he has increased weight and increased swelling in the feet and has not been using his furosemide on a regular basis.  He is under a great deal stress because in April of this year his son was apparently murdered and he and his wife both of  been in significant depression over this  He comes in today with oxygen saturation of 82% walking from the lobby into the exam room on 2 L  He is still smoking 3 to 4 cigarettes daily Review shortness of breath assessment below  08/11/2019  This patient returns today in follow-up for advanced COPD has continued to lose weight is down to 115 pounds with a BMI of 15.6  He has early satiety and has increased gas retention and aerophagia.  He maintains Symbicort at 2 inhalations twice daily and Incruse 1 puff a day  The patient also is using furosemide for chronic diastolic heart failure and does maintain an increased level of oxygen from 2 L rest to 4 L exertion  With the use of nicotine replacement therapy the patient is down to 4 cigarettes daily he does not drink alcohol  The mucus he is producing is clear with a less productive cough noted  The patient still has some element of wheezing  Shortness of Breath This is a chronic problem. The current episode started more than 1 year ago. The problem occurs daily. The problem has been gradually improving. Associated symptoms include sputum production and wheezing. Pertinent negatives include no chest pain, claudication, fever, headaches, hemoptysis, leg swelling, PND, rhinorrhea or sore throat. The symptoms are aggravated by any activity, weather changes and lying flat. Associated  symptoms comments: Mucus is dark.  Cloudy . Risk factors include smoking. He has tried beta agonist inhalers, ipratropium inhalers and steroid inhalers for the symptoms. The treatment provided significant relief. His past medical history is significant for COPD.    Past Medical History:  Diagnosis Date  . Bronchitis   . COPD (chronic obstructive pulmonary disease) ([place])   . Diastolic heart failure ([place])   . GERD (gastroesophageal reflux disease)   . Pulmonary hypertension ([place])   . 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 ([place]) 04/10/2018   . Respiratory failure with hypoxia (HCC)      Family History  Problem Relation Age of Onset  . Cancer Mother   . Cancer Father   . CAD Father   . Heart failure Father   . Diabetes Maternal Grandmother   . Kidney disease Maternal Grandmother   . Kidney cancer Paternal Grandmother   . Heart attack Paternal Grandfather      Social History   Socioeconomic History  . Marital status: Married    Spouse name: Not on file  . Number of children: Not on file  . Years of education: Not on file  . Highest education level: Not on file  Occupational History  . Not on file  Tobacco Use  . Smoking status: Current Every Day Smoker    Packs/day: 1.00    Years: 44.00    Pack years: 44.00    Types: Cigarettes  . Smokeless tobacco: Never Used  Substance and Sexual Activity  . Alcohol use: Not Currently    Comment: Socially   . Drug use: No  . Sexual activity: Not Currently  Other Topics Concern  . Not on file  Social History Narrative  . Not on file   Social Determinants of Health   Financial Resource Strain:   . Difficulty of Paying Living Expenses: Not on file  Food Insecurity:   . Worried About Charity fundraiser in the Last Year: Not on file  . Ran Out of Food in the Last Year: Not on file  Transportation Needs:   . Lack of Transportation (Medical): Not on file  . Lack of Transportation (Non-Medical): Not on file  Physical Activity:   . Days of Exercise per Week: Not on file  . Minutes of Exercise per Session: Not on file  Stress:   . Feeling of Stress : Not on file  Social Connections:   . Frequency of Communication with Friends and Family: Not on file  . Frequency of Social Gatherings with Friends and Family: Not on file  . Attends Religious Services: Not on file  . Active Member of Clubs or Organizations: Not on file  . Attends Archivist Meetings: Not on file  . Marital Status: Not on file  Intimate Partner Violence:   . Fear of Current or Ex-Partner:  Not on file  . Emotionally Abused: Not on file  . Physically Abused: Not on file  . Sexually Abused: Not on file     Allergies  Allergen Reactions  . Erythromycin      Outpatient Medications Prior to Visit  Medication Sig Dispense Refill  . albuterol (PROVENTIL) (2.5 MG/3ML) 0.083% nebulizer solution Take 3 mLs (2.5 mg total) by nebulization every 6 (six) hours as needed for wheezing or shortness of breath. 75 mL 12  . albuterol (VENTOLIN HFA) 108 (90 Base) MCG/ACT inhaler Inhale 2 puffs into the lungs every 6 (six) hours as needed for wheezing or shortness of  breath. 18 g 11  . budesonide-formoterol (SYMBICORT) 160-4.5 MCG/ACT inhaler Inhale 2 puffs into the lungs 2 (two) times daily. 1 Inhaler 11  . docusate sodium (COLACE) 100 MG capsule Take 1 capsule (100 mg total) by mouth daily. 10 capsule 0  . nicotine polacrilex (NICORETTE MINI) 2 MG lozenge Use 3-4 times daily to quit smoking 100 lozenge 2  . OXYGEN 2lpm as needed per pt  [facility]    . potassium chloride SA (KLOR-CON) 20 MEQ tablet Take 1 tablet (20 mEq total) by mouth daily. 30 tablet 3  . umeclidinium bromide (INCRUSE ELLIPTA) 62.5 MCG/INH AEPB Inhale 1 puff into the lungs daily. 1 each 11  . furosemide (LASIX) 20 MG tablet Take 1 tablet (20 mg total) by mouth daily. (Patient not taking: Reported on 08/11/2019) 30 tablet 3  . azithromycin (ZITHROMAX) 250 MG tablet Take 1 tablet (250 mg total) by mouth daily. Take two once then one daily until gone 6 tablet 0  . famotidine (PEPCID) 20 MG tablet Take 1 tablet (20 mg total) by mouth 2 (two) times daily. (Patient not taking: Reported on 08/11/2019) 60 tablet 3  . predniSONE (DELTASONE) 10 MG tablet Take 4 tablets daily for 5 days then stop 20 tablet 0   No facility-administered medications prior to visit.     Review of Systems  Constitutional: Positive for unexpected weight change. Negative for fever.  HENT: Negative for rhinorrhea and sore throat.   Respiratory: Positive for cough,  sputum production, chest tightness, shortness of breath and wheezing. Negative for hemoptysis.   Cardiovascular: Negative for chest pain, claudication, leg swelling and PND.  Gastrointestinal: Negative.   Genitourinary: Negative.   Musculoskeletal: Negative.   Neurological: Negative for headaches.  Psychiatric/Behavioral: Negative.        Objective:   Physical Exam    Vitals:   08/11/19 1332  BP: 100/66  Pulse: 99  Resp: 18  Temp: 98 F (36.7 C)  TempSrc: Oral  SpO2: 95%  Weight: 115 lb (52.2 kg)  Height: 6' (1.829 m)    Gen: Pleasant, thin, in no distress,  normal affect  ENT: No lesions,  mouth clear,  oropharynx clear, no postnasal drip  Neck: No JVD, no TMG, no carotid bruits  Lungs: No use of accessory muscles, no dullness to percussion, distant breath sounds with hyperresonance to percussion, few expiratory wheezes noted  Cardiovascular: RRR, heart sounds normal, no murmur or gallops, 2+ peripheral edema  Abdomen: soft and NT, no HSM,  BS normal  Musculoskeletal: No deformities, no cyanosis or clubbing  Neuro: alert, non focal  Skin: Warm, no lesions or rashes   Assessment & Plan:  I personally reviewed all images and lab data in the [facility] system as well as any outside material available during this office visit and agree with the  radiology impressions.   COPD GOLD IV still smoking Gold stage D COPD with FEV1 of 22% of predicted  The is currently on Symbicort and Incruse as he cannot afford the Bevespi inhaler  Even with patient assistance would not be able obtain this medication for him  He does maintain albuterol as well as needed  Plan to be to continue oxygen therapy Symbicort and Incruse and will also give a pulse dose of prednisone  COPD exacerbation ([place]) See COPD assessment  Chronic diastolic heart failure (HCC) Chronic diastolic heart failure we will continue furosemide only as needed  Cigarette smoker Active tobacco use for this will  continue nicotine 2 mg lozenges 3-4  times daily  Abnormal findings on diagnostic imaging of lung With concern of possible lung nodule on chest x-ray will obtain a CT scan of the chest with weight loss  Weight loss We will check CEA level along with CT scan of the chest and a PSA level   [place] was seen today for follow-up.  Diagnoses and all orders for this visit:  COPD exacerbation ([place])  Abnormal findings on diagnostic imaging of lung -     CT Chest Wo Contrast; Future  Weight loss -     PSA -     CEA -     Hepatic function panel  Low urine output -     PSA  Colon cancer screening -     Fecal occult blood, imunochemical  Chronic respiratory failure with hypoxia (HCC)  COPD GOLD IV still smoking  Chronic diastolic heart failure (HCC)  Cigarette smoker  Other orders -     predniSONE (DELTASONE) 10 MG tablet; Take 4 tablets daily for 5 days then stop  Tetanus vaccine was given at this visit

## 2019-08-11 NOTE — Assessment & Plan Note (Signed)
Chronic diastolic heart failure we will continue furosemide only as needed

## 2019-08-11 NOTE — Assessment & Plan Note (Signed)
Active tobacco use for this will continue nicotine 2 mg lozenges 3-4 times daily

## 2019-08-13 LAB — HEPATIC FUNCTION PANEL
ALT: 32 IU/L (ref 0–44)
AST: 27 IU/L (ref 0–40)
Albumin: 4.3 g/dL (ref 3.8–4.9)
Alkaline Phosphatase: 67 IU/L (ref 39–117)
Bilirubin Total: 0.4 mg/dL (ref 0.0–1.2)
Bilirubin, Direct: 0.15 mg/dL (ref 0.00–0.40)
Total Protein: 7.2 g/dL (ref 6.0–8.5)

## 2019-08-13 LAB — CEA: CEA: 6.2 ng/mL — ABNORMAL HIGH (ref 0.0–4.7)

## 2019-08-13 LAB — PSA: Prostate Specific Ag, Serum: 1 ng/mL (ref 0.0–4.0)

## 2019-08-14 ENCOUNTER — Other Ambulatory Visit: Payer: Self-pay | Admitting: Critical Care Medicine

## 2019-08-14 DIAGNOSIS — R634 Abnormal weight loss: Secondary | ICD-10-CM

## 2019-08-14 DIAGNOSIS — R970 Elevated carcinoembryonic antigen [CEA]: Secondary | ICD-10-CM | POA: Insufficient documentation

## 2019-08-17 ENCOUNTER — Ambulatory Visit (HOSPITAL_COMMUNITY): Admission: RE | Admit: 2019-08-17 | Payer: Self-pay | Source: Ambulatory Visit

## 2019-08-24 LAB — FECAL OCCULT BLOOD, GUAIAC: Fecal Occult Blood: NEGATIVE

## 2019-08-28 MED FILL — SYMBICORT 160-4.5 MCG INH: 160-4.5 | 30 days supply | Qty: 10 | Fill #2

## 2019-08-28 MED FILL — ALBUTEROL SULFATE HFA 108 (: 108 (90 BAS | 25 days supply | Qty: 9 | Fill #2

## 2019-08-28 MED FILL — !INCRUSE ELLIPTA 62.5 MCG I: 62.5 | 30 days supply | Qty: 30 | Fill #3

## 2019-08-28 MED FILL — ?ALBUTEROL SUL 2.5 MG/3 MLS: (2.5 MG/3ML | 14 days supply | Qty: 180 | Fill #2

## 2019-09-30 MED FILL — SYMBICORT 160-4.5 MCG INH: 160-4.5 | 30 days supply | Qty: 10 | Fill #3

## 2019-09-30 MED FILL — ALBUTEROL SULFATE HFA 108 (: 108 (90 BAS | 25 days supply | Qty: 9 | Fill #3

## 2019-09-30 MED FILL — !INCRUSE ELLIPTA 62.5 MCG I: 62.5 | 30 days supply | Qty: 30 | Fill #4

## 2019-09-30 MED FILL — ALBUTEROL SUL 2.5 MG/3 ML S: (2.5 MG/3ML | 14 days supply | Qty: 180 | Fill #3

## 2019-10-13 ENCOUNTER — Ambulatory Visit: Payer: Self-pay | Admitting: Critical Care Medicine

## 2019-10-21 ENCOUNTER — Encounter: Payer: Self-pay | Admitting: Family Medicine

## 2019-11-05 MED FILL — !INCRUSE ELLIPTA 62.5 MCG I: 62.5 | 30 days supply | Qty: 30 | Fill #5

## 2019-11-05 MED FILL — SYMBICORT 160-4.5 MCG INH: 160-4.5 | 30 days supply | Qty: 10 | Fill #4

## 2019-11-12 ENCOUNTER — Encounter: Payer: Self-pay | Admitting: Critical Care Medicine

## 2019-11-12 ENCOUNTER — Ambulatory Visit: Payer: Self-pay | Attending: Critical Care Medicine | Admitting: Critical Care Medicine

## 2019-11-12 ENCOUNTER — Other Ambulatory Visit: Payer: Self-pay

## 2019-11-12 VITALS — BP 93/65 | HR 107 | Temp 98.0°F | Resp 18 | Ht 71.0 in | Wt 116.0 lb

## 2019-11-12 DIAGNOSIS — Z9981 Dependence on supplemental oxygen: Secondary | ICD-10-CM | POA: Insufficient documentation

## 2019-11-12 DIAGNOSIS — Z8249 Family history of ischemic heart disease and other diseases of the circulatory system: Secondary | ICD-10-CM | POA: Insufficient documentation

## 2019-11-12 DIAGNOSIS — R970 Elevated carcinoembryonic antigen [CEA]: Secondary | ICD-10-CM | POA: Insufficient documentation

## 2019-11-12 DIAGNOSIS — Z1211 Encounter for screening for malignant neoplasm of colon: Secondary | ICD-10-CM | POA: Insufficient documentation

## 2019-11-12 DIAGNOSIS — Z79899 Other long term (current) drug therapy: Secondary | ICD-10-CM | POA: Insufficient documentation

## 2019-11-12 DIAGNOSIS — K219 Gastro-esophageal reflux disease without esophagitis: Secondary | ICD-10-CM | POA: Insufficient documentation

## 2019-11-12 DIAGNOSIS — J9611 Chronic respiratory failure with hypoxia: Secondary | ICD-10-CM | POA: Insufficient documentation

## 2019-11-12 DIAGNOSIS — Z833 Family history of diabetes mellitus: Secondary | ICD-10-CM | POA: Insufficient documentation

## 2019-11-12 DIAGNOSIS — R634 Abnormal weight loss: Secondary | ICD-10-CM | POA: Insufficient documentation

## 2019-11-12 DIAGNOSIS — I2729 Other secondary pulmonary hypertension: Secondary | ICD-10-CM | POA: Insufficient documentation

## 2019-11-12 DIAGNOSIS — Z7951 Long term (current) use of inhaled steroids: Secondary | ICD-10-CM | POA: Insufficient documentation

## 2019-11-12 DIAGNOSIS — R918 Other nonspecific abnormal finding of lung field: Secondary | ICD-10-CM

## 2019-11-12 DIAGNOSIS — Z881 Allergy status to other antibiotic agents status: Secondary | ICD-10-CM | POA: Insufficient documentation

## 2019-11-12 DIAGNOSIS — I2781 Cor pulmonale (chronic): Secondary | ICD-10-CM

## 2019-11-12 DIAGNOSIS — F17210 Nicotine dependence, cigarettes, uncomplicated: Secondary | ICD-10-CM | POA: Insufficient documentation

## 2019-11-12 DIAGNOSIS — Z681 Body mass index (BMI) 19 or less, adult: Secondary | ICD-10-CM | POA: Insufficient documentation

## 2019-11-12 DIAGNOSIS — J449 Chronic obstructive pulmonary disease, unspecified: Secondary | ICD-10-CM | POA: Insufficient documentation

## 2019-11-12 NOTE — Patient Instructions (Addendum)
No change in medications  Please consider Covid vaccine: COVID-19 Vaccine Information can be found at: [email or web address].uk For questions related to vaccine distribution or appointments, please email [email or web address] or call 4121898646.    We will look into getting you a light weight portable oxygen system  Use Ensure max protein shake three times a day  Will schedule CT chest and Abdomen  Return Dr [name] 4 months with video visit

## 2019-11-12 NOTE — Progress Notes (Signed)
Subjective:    Patient ID: Anthony Nelson, male    DOB: [birth year], 59 y.o.   MRN: [id number]   This is a 59 year old male with gold stage D COPD on home oxygen.  The patient's been followed over time by the pulmonary clinic with Dr. Melvyn Novas.  His last visit was in 2019.  The patient is still actively smoking at this time.  The patient has been on a variety of inhalers but currently takes Incruse daily and Symbicort 160  twoinhalations twice daily.  Patient states overall his level of dyspnea is stable he states he has minimal cough.  He is not had recent exacerbations.  He does state when he ran out of his inhalers and was under a lot of stress he started smoking again his breathing worsened.  He is trying to go back to the nicotine patches and focusing on smoking cessation.  He does not have excess secretions at this time.  Pulmonary functions done in 2019 revealed FEV1 of less than 30% predicted consistent with gold stage D COPD  Note the patient also has pulmonary hypertension and cor pulmonale on the basis of the patient's COPD  The patient is on oxygen 2 L continuous at home   06/24/2019 This is the first in office visit as the last visit was a telephone visit.  This patient has gold stage IV COPD still actively smoking.  He has had issues with access to some of the inhalers however we were able to achieve at the last telephone visit patient assistance for Symbicort 160 and Incruse and is on both these medicines at this time.  He also maintains oxygen 2 L continuous.  Note he had been hospitalized in October.  During the October admission he had significant fluid retention and evidence of pulmonary hypertension with left ventricular hypertrophy  Today he notes he has increased weight and increased swelling in the feet and has not been using his furosemide on a regular basis.  He is under a great deal stress because in April of this year his son was apparently murdered and he and his wife both of  been in significant depression over this  He comes in today with oxygen saturation of 82% walking from the lobby into the exam room on 2 L  He is still smoking 3 to 4 cigarettes daily Review shortness of breath assessment below  08/11/2019  This patient returns today in follow-up for advanced COPD has continued to lose weight is down to 115 pounds with a BMI of 15.6  He has early satiety and has increased gas retention and aerophagia.  He maintains Symbicort at 2 inhalations twice daily and Incruse 1 puff a day  The patient also is using furosemide for chronic diastolic heart failure and does maintain an increased level of oxygen from 2 L rest to 4 L exertion  With the use of nicotine replacement therapy the patient is down to 4 cigarettes daily he does not drink alcohol  The mucus he is producing is clear with a less productive cough noted  The patient still has some element of wheezing  11/12/2019 Patient is seen in return follow-up for chronic obstructive lung disease and unexplained weight loss. Patient states his level of dyspnea is at baseline.  He is maintaining Symbicort twice daily and Incruse daily.  He does maintain furosemide for diastolic heart failure and oxygen 2 L rest for exertion.  He does have stage IV COPD based on previous spirometry.  Patient  still smoking 3 cigarettes daily.  He is applying for disability remains anxious over this.  Wt Readings from Last 3 Encounters: 11/12/19 : 116 lb (52.6 kg) 08/11/19 : 115 lb (52.2 kg) 06/24/19 : 121 lb (54.9 kg)  Note at the last visit he had an elevated CEA level and we ordered a CT chest and abdomen that this is yet to be completed   Shortness of Breath This is a chronic problem. The current episode started more than 1 year ago. The problem occurs daily. The problem has been gradually improving. Associated symptoms include sputum production. Pertinent negatives include no chest pain, claudication, fever, headaches,  hemoptysis, leg swelling, PND, rhinorrhea, sore throat or wheezing. The symptoms are aggravated by any activity, weather changes and lying flat. Associated symptoms comments: Mucus is dark.  Cloudy . Risk factors include smoking. He has tried beta agonist inhalers, ipratropium inhalers and steroid inhalers for the symptoms. The treatment provided significant relief. His past medical history is significant for COPD.    Past Medical History:  Diagnosis Date  . Bronchitis   . COPD (chronic obstructive pulmonary disease) ([place])   . Diastolic heart failure ([place])   . GERD (gastroesophageal reflux disease)   . Pulmonary hypertension ([place])   . 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 ([place]) 04/10/2018  . Respiratory failure with hypoxia (HCC)      Family History  Problem Relation Age of Onset  . Cancer Mother   . Cancer Father   . CAD Father   . Heart failure Father   . Diabetes Maternal Grandmother   . Kidney disease Maternal Grandmother   . Kidney cancer Paternal Grandmother   . Heart attack Paternal Grandfather      Social History   Socioeconomic History  . Marital status: Married    Spouse name: Not on file  . Number of children: Not on file  . Years of education: Not on file  . Highest education level: Not on file  Occupational History  . Not on file  Tobacco Use  . Smoking status: Current Every Day Smoker    Packs/day: 0.10    Years: 44.00    Pack years: 4.40    Types: Cigarettes  . Smokeless tobacco: Never Used  Substance and Sexual Activity  . Alcohol use: Not Currently    Comment: Socially   . Drug use: No  . Sexual activity: Not Currently  Other Topics Concern  . Not on file  Social History Narrative  . Not on file   Social Determinants of Health   Financial Resource Strain:   . Difficulty of Paying Living Expenses:   Food Insecurity:   . Worried About Charity fundraiser in the Last Year:   . Arboriculturist in the Last Year:    Transportation Needs:   . Film/video editor (Medical):   Marland Kitchen Lack of Transportation (Non-Medical):   Physical Activity:   . Days of Exercise per Week:   . Minutes of Exercise per Session:   Stress:   . Feeling of Stress :   Social Connections:   . Frequency of Communication with Friends and Family:   . Frequency of Social Gatherings with Friends and Family:   . Attends Religious Services:   . Active Member of Clubs or Organizations:   . Attends Archivist Meetings:   Marland Kitchen Marital Status:   Intimate Partner Violence:   . Fear of Current or Ex-Partner:   . Emotionally Abused:   .  Physically Abused:   . Sexually Abused:      Allergies  Allergen Reactions  . Erythromycin      Outpatient Medications Prior to Visit  Medication Sig Dispense Refill  . albuterol (PROVENTIL) (2.5 MG/3ML) 0.083% nebulizer solution Take 3 mLs (2.5 mg total) by nebulization every 6 (six) hours as needed for wheezing or shortness of breath. 75 mL 12  . albuterol (VENTOLIN HFA) 108 (90 Base) MCG/ACT inhaler Inhale 2 puffs into the lungs every 6 (six) hours as needed for wheezing or shortness of breath. 18 g 11  . budesonide-formoterol (SYMBICORT) 160-4.5 MCG/ACT inhaler Inhale 2 puffs into the lungs 2 (two) times daily. 1 Inhaler 11  . docusate sodium (COLACE) 100 MG capsule Take 1 capsule (100 mg total) by mouth daily. 10 capsule 0  . OXYGEN 2lpm as needed per pt  [facility]    . potassium chloride SA (KLOR-CON) 20 MEQ tablet Take 1 tablet (20 mEq total) by mouth daily. 30 tablet 3  . umeclidinium bromide (INCRUSE ELLIPTA) 62.5 MCG/INH AEPB Inhale 1 puff into the lungs daily. 1 each 11  . furosemide (LASIX) 20 MG tablet Take 1 tablet (20 mg total) by mouth daily. (Patient taking differently: Take 20 mg by mouth daily. Taking it only when feet are swollen) 30 tablet 3  . nicotine polacrilex (NICORETTE MINI) 2 MG lozenge Use 3-4 times daily to quit smoking (Patient not taking: Reported on 11/12/2019) 100  lozenge 2  . predniSONE (DELTASONE) 10 MG tablet Take 4 tablets daily for 5 days then stop (Patient not taking: Reported on 11/12/2019) 20 tablet 0   No facility-administered medications prior to visit.     Review of Systems  Constitutional: Positive for unexpected weight change. Negative for fever.  HENT: Negative for rhinorrhea and sore throat.   Respiratory: Positive for sputum production and shortness of breath. Negative for cough, hemoptysis, chest tightness and wheezing.   Cardiovascular: Negative for chest pain, claudication, leg swelling and PND.  Gastrointestinal: Negative.   Genitourinary: Negative.   Musculoskeletal: Negative.   Neurological: Negative for headaches.  Psychiatric/Behavioral: Negative.        Objective:   Physical Exam    Vitals:   11/12/19 1050  BP: 93/65  Pulse: (!) 107  Resp: 18  Temp: 98 F (36.7 C)  SpO2: 94%  Weight: 116 lb (52.6 kg)  Height: _0  (1.803 m)    Gen: Pleasant, thin, in no distress,  normal affect  ENT: No lesions,  mouth clear,  oropharynx clear, no postnasal drip  Neck: No JVD, no TMG, no carotid bruits  Lungs: No use of accessory muscles, no dullness to percussion, distant breath sounds with hyperresonance to percussion, no wheezes noted  Cardiovascular: RRR, heart sounds normal, no murmur or gallops, 1+ peripheral edema  Abdomen: soft and NT, no HSM,  BS normal  Musculoskeletal: No deformities, no cyanosis or clubbing  Neuro: alert, non focal  Skin: Warm, no lesions or rashes   Assessment & Plan:  I personally reviewed all images and lab data in the [facility] system as well as any outside material available during this office visit and agree with the  radiology impressions.   COPD GOLD IV still smoking Gold stage D COPD with continued smoking  I spent a considerable amount of time at this visit with smoking cessation counseling recommended continue nicotine replacement therapy the patient is yet to set a quit  date he has been given educational information I also spent time a behavioral  modification at this visit giving him coping meals when he is triggered to smoke  Patient to maintain current inhaled medications as prescribed and oxygen as prescribed.  I told him once he obtains Medicaid and disability we can consider getting him a portable oxygen concentrator  Weight loss Ongoing weight loss which actually stabilized over the last several months  I gave the patient samples of high-protein Ensure that I recommended he use to avoid the high carbohydrate contents of other nutritional supplements  I am obtaining a CT scan of the abdomen and chest with a high CEA level system make sure that he does not have an underlying malignancy   Diagnoses and all orders for this visit:  COPD GOLD IV still smoking -     CT Chest W Contrast; Future  Colon cancer screening -     Cancel: Fecal occult blood, imunochemical; Future  Cor pulmonale (chronic) (HCC)  Chronic respiratory failure with hypoxia (HCC)  Weight loss -     CT Chest W Contrast; Future -     Cancel: CT ABDOMEN PELVIS W WO CONTRAST; Future -     CT ABDOMEN PELVIS W CONTRAST; Future  Elevated CEA -     Cancel: CT ABDOMEN PELVIS W WO CONTRAST; Future -     CT ABDOMEN PELVIS W CONTRAST; Future  Abnormal findings on diagnostic imaging of lung -     CT Chest W Contrast; Future

## 2019-11-12 NOTE — Progress Notes (Signed)
Here for COPD f /u / feeling little anxious lately  due to disability process    Spoke with [facility] / test scheduled both order linked/ Instructions given to pt

## 2019-11-13 NOTE — Assessment & Plan Note (Signed)
Gold stage D COPD with continued smoking  I spent a considerable amount of time at this visit with smoking cessation counseling recommended continue nicotine replacement therapy the patient is yet to set a quit date he has been given educational information I also spent time a behavioral modification at this visit giving him coping meals when he is triggered to smoke  Patient to maintain current inhaled medications as prescribed and oxygen as prescribed.  I told him once he obtains Medicaid and disability we can consider getting him a portable oxygen concentrator

## 2019-11-13 NOTE — Assessment & Plan Note (Signed)
Ongoing weight loss which actually stabilized over the last several months  I gave the patient samples of high-protein Ensure that I recommended he use to avoid the high carbohydrate contents of other nutritional supplements  I am obtaining a CT scan of the abdomen and chest with a high CEA level system make sure that he does not have an underlying malignancy

## 2019-11-16 ENCOUNTER — Telehealth: Payer: Self-pay | Admitting: Critical Care Medicine

## 2019-11-16 NOTE — Telephone Encounter (Signed)
I tried to call the patient,  His stool study was negative for blood in the stool  Can you call him back to tell him?

## 2019-11-17 NOTE — Telephone Encounter (Signed)
No answer.  UTR, unable to leave message

## 2019-11-26 ENCOUNTER — Other Ambulatory Visit: Payer: Self-pay

## 2019-11-26 ENCOUNTER — Ambulatory Visit (HOSPITAL_COMMUNITY)
Admission: RE | Admit: 2019-11-26 | Discharge: 2019-11-26 | Disposition: A | Discharge status: Home / Self Care | Payer: Self-pay | Source: Ambulatory Visit | Attending: Critical Care Medicine | Admitting: Critical Care Medicine

## 2019-11-26 DIAGNOSIS — R634 Abnormal weight loss: Secondary | ICD-10-CM | POA: Insufficient documentation

## 2019-11-26 DIAGNOSIS — R970 Elevated carcinoembryonic antigen [CEA]: Secondary | ICD-10-CM | POA: Insufficient documentation

## 2019-11-26 DIAGNOSIS — R918 Other nonspecific abnormal finding of lung field: Secondary | ICD-10-CM | POA: Insufficient documentation

## 2019-11-26 DIAGNOSIS — J449 Chronic obstructive pulmonary disease, unspecified: Secondary | ICD-10-CM | POA: Insufficient documentation

## 2019-11-26 MED ORDER — IOHEXOL 300 MG/ML  SOLN
100.0000 mL | Freq: Once | INTRAMUSCULAR | Status: AC | PRN
  Administered 2019-11-26: 100 mL via INTRAVENOUS

## 2019-11-26 MED ORDER — SODIUM CHLORIDE (PF) 0.9 % IJ SOLN
INTRAMUSCULAR | Status: AC
  Filled 2019-11-26: qty 50

## 2019-11-27 ENCOUNTER — Telehealth: Payer: Self-pay

## 2019-11-27 NOTE — Telephone Encounter (Signed)
Patient has spoken to Eden Lathe and discuss his concerns.

## 2019-11-27 NOTE — Telephone Encounter (Addendum)
Pt request Anthony Nelson prescribe Portable oxygen machine. Pt says someone was supposed to be checking into it. Pt wants it sent to [facility] because Medicaid will pay there. Pt is willing to pick up prescription to take it there. [facility] on [address] in [place], they are no longer in [place].  Call pt [phone] cell phone Also pt needs a large tank to have on standby.

## 2019-11-27 NOTE — Telephone Encounter (Signed)
Call returned to patient regarding his O2 questions. Since his call to [facility] he has determined that he only has [employer] and that does not cover O2 or medications  He needs to contact DSS about the medicaid and inquire why he is not eligible for full medicaid.  If/when he receives full medicaid, then the order for POC can be placed. He said that he uses his O2 @ 2-2.5 L most of the time but not all of the time. Informed him that [facility] does not carry POC and his O2 would need to be switched to another company.  He also needs to call [facility] about a large back up O2 tank for his home.  He said that he never received it when he received his initial O2 set up.  He is working on a Environmental manager and has contacted [employer] but has been having difficulty getting all of his medical records from [place].  Instructed him to contact [employer] again.  He said that he has an attorney on stand by if more assistance is needed.

## 2019-12-03 ENCOUNTER — Telehealth: Payer: Self-pay | Admitting: Family Medicine

## 2019-12-03 NOTE — Telephone Encounter (Signed)
Pt called back, says someone called him but no notes are on file for any missed calls.

## 2019-12-11 MED FILL — !INCRUSE ELLIPTA 62.5 MCG I: 62.5 | 30 days supply | Qty: 30 | Fill #6

## 2020-01-21 ENCOUNTER — Other Ambulatory Visit: Payer: Self-pay | Admitting: Family Medicine

## 2020-01-21 DIAGNOSIS — J449 Chronic obstructive pulmonary disease, unspecified: Secondary | ICD-10-CM

## 2020-01-21 DIAGNOSIS — I5032 Chronic diastolic (congestive) heart failure: Secondary | ICD-10-CM

## 2020-01-21 MED ORDER — ALBUTEROL SULFATE (2.5 MG/3ML) 0.083% IN NEBU: 2.5000 mg | INHALATION_SOLUTION | Freq: Four times a day (QID) | RESPIRATORY_TRACT | 0 refills | Status: DC | PRN

## 2020-01-21 MED ORDER — ALBUTEROL SULFATE HFA 108 (90 BASE) MCG/ACT IN AERS: 2.0000 | INHALATION_SPRAY | Freq: Four times a day (QID) | RESPIRATORY_TRACT | 0 refills | Status: DC | PRN

## 2020-01-21 MED ORDER — FUROSEMIDE 20 MG PO TABS: 20.0000 mg | ORAL_TABLET | Freq: Every day | ORAL | 1 refills | Status: DC

## 2020-01-21 MED ORDER — BUDESONIDE-FORMOTEROL FUMARATE 160-4.5 MCG/ACT IN AERO: 2.0000 | INHALATION_SPRAY | Freq: Two times a day (BID) | RESPIRATORY_TRACT | 1 refills | Status: DC

## 2020-01-21 MED FILL — ALBUTEROL SUL 2.5 MG/3 ML S: (2.5 MG/3ML | 6 days supply | Qty: 75 | Fill #0

## 2020-01-21 MED FILL — !VENTOLIN HFA INHALER: 108 (90 BAS | 25 days supply | Qty: 18 | Fill #0

## 2020-01-21 MED FILL — FUROSEMIDE 20 MG TABS: 20 | 30 days supply | Qty: 30 | Fill #0

## 2020-01-21 MED FILL — !SYMBICORT 160-4.5 MCG INH: 160-4.5 | 30 days supply | Qty: 1 | Fill #0

## 2020-01-21 NOTE — Telephone Encounter (Signed)
PT need a refill albuterol (PROVENTIL) (2.5 MG/3ML) 0.083% nebulizer solution [[id number]]  albuterol (VENTOLIN HFA) 108 (90 Base) MCG/ACT inhaler [[id number]]  budesonide-formoterol (SYMBICORT) 160-4.5 MCG/ACT inhaler [[id number]]  furosemide (LASIX) 20 MG tablet [[id number]]  [place], [place] [address]  [place] [address]  Phone: [phone] Fax: [phone]

## 2020-01-21 NOTE — Addendum Note (Signed)
Addended by: Jefferson Fuel on: 01/21/2020 12:14 PM   Modules accepted: Orders

## 2020-01-29 MED FILL — !INCRUSE ELLIPTA 62.5 MCG I: 62.5 | 30 days supply | Qty: 30 | Fill #7

## 2020-02-05 MED FILL — FAMOTIDINE 20 MG TABS: 20 | 30 days supply | Qty: 60 | Fill #1

## 2020-02-18 MED FILL — FAMOTIDINE 20 MG TABS: 20 | 30 days supply | Qty: 60 | Fill #1

## 2020-03-08 ENCOUNTER — Emergency Department (HOSPITAL_COMMUNITY): Payer: Medicaid Other

## 2020-03-08 ENCOUNTER — Other Ambulatory Visit: Payer: Self-pay

## 2020-03-08 ENCOUNTER — Inpatient Hospital Stay (HOSPITAL_COMMUNITY)
Admission: EM | Admit: 2020-03-08 | Discharge: 2020-03-15 | DRG: 190 | Disposition: A | Discharge status: Home Health | Payer: Medicaid Other | Attending: Family Medicine | Admitting: Family Medicine

## 2020-03-08 ENCOUNTER — Inpatient Hospital Stay (HOSPITAL_COMMUNITY): Payer: Medicaid Other

## 2020-03-08 ENCOUNTER — Encounter (HOSPITAL_COMMUNITY): Payer: Self-pay | Admitting: Emergency Medicine

## 2020-03-08 DIAGNOSIS — R0902 Hypoxemia: Secondary | ICD-10-CM | POA: Diagnosis not present

## 2020-03-08 DIAGNOSIS — E874 Mixed disorder of acid-base balance: Secondary | ICD-10-CM | POA: Diagnosis present

## 2020-03-08 DIAGNOSIS — Z833 Family history of diabetes mellitus: Secondary | ICD-10-CM

## 2020-03-08 DIAGNOSIS — E162 Hypoglycemia, unspecified: Secondary | ICD-10-CM | POA: Diagnosis present

## 2020-03-08 DIAGNOSIS — N179 Acute kidney failure, unspecified: Secondary | ICD-10-CM | POA: Diagnosis present

## 2020-03-08 DIAGNOSIS — K219 Gastro-esophageal reflux disease without esophagitis: Secondary | ICD-10-CM | POA: Diagnosis present

## 2020-03-08 DIAGNOSIS — J441 Chronic obstructive pulmonary disease with (acute) exacerbation: Secondary | ICD-10-CM | POA: Diagnosis present

## 2020-03-08 DIAGNOSIS — Z881 Allergy status to other antibiotic agents status: Secondary | ICD-10-CM | POA: Diagnosis not present

## 2020-03-08 DIAGNOSIS — I5033 Acute on chronic diastolic (congestive) heart failure: Secondary | ICD-10-CM | POA: Diagnosis present

## 2020-03-08 DIAGNOSIS — Z7951 Long term (current) use of inhaled steroids: Secondary | ICD-10-CM

## 2020-03-08 DIAGNOSIS — Z20822 Contact with and (suspected) exposure to covid-19: Secondary | ICD-10-CM | POA: Diagnosis present

## 2020-03-08 DIAGNOSIS — J9611 Chronic respiratory failure with hypoxia: Secondary | ICD-10-CM | POA: Diagnosis present

## 2020-03-08 DIAGNOSIS — Z9119 Patient's noncompliance with other medical treatment and regimen: Secondary | ICD-10-CM | POA: Diagnosis not present

## 2020-03-08 DIAGNOSIS — G9341 Metabolic encephalopathy: Secondary | ICD-10-CM | POA: Diagnosis present

## 2020-03-08 DIAGNOSIS — J9621 Acute and chronic respiratory failure with hypoxia: Secondary | ICD-10-CM | POA: Diagnosis present

## 2020-03-08 DIAGNOSIS — I2729 Other secondary pulmonary hypertension: Secondary | ICD-10-CM | POA: Diagnosis present

## 2020-03-08 DIAGNOSIS — F17210 Nicotine dependence, cigarettes, uncomplicated: Secondary | ICD-10-CM | POA: Diagnosis present

## 2020-03-08 DIAGNOSIS — E875 Hyperkalemia: Secondary | ICD-10-CM | POA: Diagnosis present

## 2020-03-08 DIAGNOSIS — Z87891 Personal history of nicotine dependence: Secondary | ICD-10-CM | POA: Diagnosis present

## 2020-03-08 DIAGNOSIS — I2781 Cor pulmonale (chronic): Secondary | ICD-10-CM | POA: Diagnosis present

## 2020-03-08 DIAGNOSIS — J9612 Chronic respiratory failure with hypercapnia: Secondary | ICD-10-CM | POA: Diagnosis present

## 2020-03-08 DIAGNOSIS — Z8051 Family history of malignant neoplasm of kidney: Secondary | ICD-10-CM

## 2020-03-08 DIAGNOSIS — Z8249 Family history of ischemic heart disease and other diseases of the circulatory system: Secondary | ICD-10-CM

## 2020-03-08 DIAGNOSIS — J9622 Acute and chronic respiratory failure with hypercapnia: Secondary | ICD-10-CM | POA: Diagnosis present

## 2020-03-08 DIAGNOSIS — J449 Chronic obstructive pulmonary disease, unspecified: Secondary | ICD-10-CM | POA: Diagnosis present

## 2020-03-08 DIAGNOSIS — E860 Dehydration: Secondary | ICD-10-CM | POA: Diagnosis present

## 2020-03-08 DIAGNOSIS — N170 Acute kidney failure with tubular necrosis: Secondary | ICD-10-CM

## 2020-03-08 DIAGNOSIS — R609 Edema, unspecified: Secondary | ICD-10-CM | POA: Diagnosis not present

## 2020-03-08 DIAGNOSIS — I5032 Chronic diastolic (congestive) heart failure: Secondary | ICD-10-CM | POA: Diagnosis present

## 2020-03-08 DIAGNOSIS — Z79899 Other long term (current) drug therapy: Secondary | ICD-10-CM | POA: Diagnosis not present

## 2020-03-08 DIAGNOSIS — E872 Acidosis: Secondary | ICD-10-CM

## 2020-03-08 DIAGNOSIS — R4182 Altered mental status, unspecified: Secondary | ICD-10-CM | POA: Diagnosis not present

## 2020-03-08 DIAGNOSIS — R404 Transient alteration of awareness: Secondary | ICD-10-CM | POA: Diagnosis not present

## 2020-03-08 LAB — CBC WITH DIFFERENTIAL/PLATELET
Abs Immature Granulocytes: 0.03 10*3/uL (ref 0.00–0.07)
Basophils Absolute: 0 10*3/uL (ref 0.0–0.1)
Basophils Relative: 0 %
Eosinophils Absolute: 0 10*3/uL (ref 0.0–0.5)
Eosinophils Relative: 0 %
HCT: 48.1 % (ref 39.0–52.0)
Hemoglobin: 14.5 g/dL (ref 13.0–17.0)
Immature Granulocytes: 1 %
Lymphocytes Relative: 4 %
Lymphs Abs: 0.2 10*3/uL — ABNORMAL LOW (ref 0.7–4.0)
MCH: 32.6 pg (ref 26.0–34.0)
MCHC: 30.1 g/dL (ref 30.0–36.0)
MCV: 108.1 fL — ABNORMAL HIGH (ref 80.0–100.0)
Monocytes Absolute: 0.9 10*3/uL (ref 0.1–1.0)
Monocytes Relative: 14 %
Neutro Abs: 5.2 10*3/uL (ref 1.7–7.7)
Neutrophils Relative %: 81 %
Platelets: 146 10*3/uL — ABNORMAL LOW (ref 150–400)
RBC: 4.45 MIL/uL (ref 4.22–5.81)
RDW: 13.4 % (ref 11.5–15.5)
WBC: 6.4 10*3/uL (ref 4.0–10.5)
nRBC: 0 % (ref 0.0–0.2)

## 2020-03-08 LAB — BLOOD GAS, ARTERIAL
Acid-Base Excess: 25.7 mmol/L — ABNORMAL HIGH (ref 0.0–2.0)
Bicarbonate: 54.6 mmol/L — ABNORMAL HIGH (ref 20.0–28.0)
FIO2: 35
O2 Saturation: 98 %
Patient temperature: 36.9
pCO2 arterial: >120 mmHg (ref 32.0–48.0)
pH, Arterial: 7.183 — CL (ref 7.350–7.450)
pO2, Arterial: 128 mmHg — ABNORMAL HIGH (ref 83.0–108.0)

## 2020-03-08 LAB — SARS CORONAVIRUS 2 BY RT PCR (HOSPITAL ORDER, PERFORMED IN ~~LOC~~ HOSPITAL LAB): SARS Coronavirus 2: NEGATIVE

## 2020-03-08 LAB — COMPREHENSIVE METABOLIC PANEL
ALT: 122 U/L — ABNORMAL HIGH (ref 0–44)
ALT: 134 U/L — ABNORMAL HIGH (ref 0–44)
AST: 113 U/L — ABNORMAL HIGH (ref 15–41)
AST: 102 U/L — ABNORMAL HIGH (ref 15–41)
Albumin: 3.6 g/dL (ref 3.5–5.0)
Albumin: 3.9 g/dL (ref 3.5–5.0)
Alkaline Phosphatase: 63 U/L (ref 38–126)
Alkaline Phosphatase: 64 U/L (ref 38–126)
Anion gap: 12 (ref 5–15)
Anion gap: 13 (ref 5–15)
BUN: 28 mg/dL — ABNORMAL HIGH (ref 6–20)
BUN: 30 mg/dL — ABNORMAL HIGH (ref 6–20)
CO2: 40 mmol/L — ABNORMAL HIGH (ref 22–32)
CO2: 44 mmol/L — ABNORMAL HIGH (ref 22–32)
Calcium: 8.4 mg/dL — ABNORMAL LOW (ref 8.9–10.3)
Calcium: 8.6 mg/dL — ABNORMAL LOW (ref 8.9–10.3)
Chloride: 80 mmol/L — ABNORMAL LOW (ref 98–111)
Chloride: 76 mmol/L — ABNORMAL LOW (ref 98–111)
Creatinine, Ser: 1.31 mg/dL — ABNORMAL HIGH (ref 0.61–1.24)
Creatinine, Ser: 1.08 mg/dL (ref 0.61–1.24)
GFR calc Af Amer: >60 mL/min (ref >60)
GFR calc Af Amer: >60 mL/min (ref >60)
GFR calc non Af Amer: 59 mL/min — ABNORMAL LOW (ref >60)
GFR calc non Af Amer: >60 mL/min (ref >60)
Glucose, Bld: 128 mg/dL — ABNORMAL HIGH (ref 70–99)
Glucose, Bld: 89 mg/dL (ref 70–99)
Potassium: 6.2 mmol/L — ABNORMAL HIGH (ref 3.5–5.1)
Potassium: 5.1 mmol/L (ref 3.5–5.1)
Sodium: 132 mmol/L — ABNORMAL LOW (ref 135–145)
Sodium: 133 mmol/L — ABNORMAL LOW (ref 135–145)
Total Bilirubin: 1.6 mg/dL — ABNORMAL HIGH (ref 0.3–1.2)
Total Bilirubin: 0.9 mg/dL (ref 0.3–1.2)
Total Protein: 5.5 g/dL — ABNORMAL LOW (ref 6.5–8.1)
Total Protein: 6.5 g/dL (ref 6.5–8.1)

## 2020-03-08 LAB — CBC
HCT: 49.9 % (ref 39.0–52.0)
Hemoglobin: 15 g/dL (ref 13.0–17.0)
MCH: 32.6 pg (ref 26.0–34.0)
MCHC: 30.1 g/dL (ref 30.0–36.0)
MCV: 108.5 fL — ABNORMAL HIGH (ref 80.0–100.0)
Platelets: 132 10*3/uL — ABNORMAL LOW (ref 150–400)
RBC: 4.6 MIL/uL (ref 4.22–5.81)
RDW: 13.3 % (ref 11.5–15.5)
WBC: 7.6 10*3/uL (ref 4.0–10.5)
nRBC: 0 % (ref 0.0–0.2)

## 2020-03-08 LAB — CBG MONITORING, ED
Glucose-Capillary: 131 mg/dL — ABNORMAL HIGH (ref 70–99)
Glucose-Capillary: 222 mg/dL — ABNORMAL HIGH (ref 70–99)
Glucose-Capillary: 62 mg/dL — ABNORMAL LOW (ref 70–99)
Glucose-Capillary: 81 mg/dL (ref 70–99)
Glucose-Capillary: 34 mg/dL — CL (ref 70–99)

## 2020-03-08 LAB — HEMOGLOBIN A1C
Hgb A1c MFr Bld: 5.9 % — ABNORMAL HIGH (ref 4.8–5.6)
Mean Plasma Glucose: 122.63 mg/dL

## 2020-03-08 LAB — URINALYSIS, ROUTINE W REFLEX MICROSCOPIC
Bilirubin Urine: NEGATIVE
Glucose, UA: NEGATIVE mg/dL
Hgb urine dipstick: NEGATIVE
Ketones, ur: NEGATIVE mg/dL
Leukocytes,Ua: NEGATIVE
Nitrite: NEGATIVE
Protein, ur: NEGATIVE mg/dL
Specific Gravity, Urine: 1.006 (ref 1.005–1.030)
pH: 5 (ref 5.0–8.0)

## 2020-03-08 LAB — MAGNESIUM: Magnesium: 1.7 mg/dL (ref 1.7–2.4)

## 2020-03-08 LAB — BRAIN NATRIURETIC PEPTIDE: B Natriuretic Peptide: 1235.5 pg/mL — ABNORMAL HIGH (ref 0.0–100.0)

## 2020-03-08 LAB — PHOSPHORUS: Phosphorus: 5.6 mg/dL — ABNORMAL HIGH (ref 2.5–4.6)

## 2020-03-08 MED ORDER — INSULIN ASPART 100 UNIT/ML ~~LOC~~ SOLN
0.0000 [IU] | SUBCUTANEOUS | Status: DC
  Administered 2020-03-08: 1 [IU] via SUBCUTANEOUS

## 2020-03-08 MED ORDER — ONDANSETRON HCL 4 MG/2ML IJ SOLN: 4.0000 mg | Freq: Four times a day (QID) | INTRAMUSCULAR | Status: DC | PRN

## 2020-03-08 MED ORDER — SODIUM CHLORIDE 0.9 % IV BOLUS
1000.0000 mL | Freq: Once | INTRAVENOUS | Status: AC
  Administered 2020-03-08: 1000 mL via INTRAVENOUS

## 2020-03-08 MED ORDER — DOCUSATE SODIUM 100 MG PO CAPS
100.0000 mg | ORAL_CAPSULE | Freq: Two times a day (BID) | ORAL | Status: DC | PRN
  Administered 2020-03-09 – 2020-03-11 (×2): 100 mg via ORAL
  Filled 2020-03-08 (×2): qty 1

## 2020-03-08 MED ORDER — DEXTROSE 50 % IV SOLN
INTRAVENOUS | Status: AC
  Administered 2020-03-08: 50 mL
  Filled 2020-03-08: qty 50

## 2020-03-08 MED ORDER — POLYETHYLENE GLYCOL 3350 17 G PO PACK
17.0000 g | PACK | Freq: Every day | ORAL | Status: DC | PRN
  Administered 2020-03-09 – 2020-03-15 (×3): 17 g via ORAL
  Filled 2020-03-08 (×3): qty 1

## 2020-03-08 MED ORDER — INSULIN ASPART 100 UNIT/ML ~~LOC~~ SOLN
10.0000 [IU] | Freq: Once | SUBCUTANEOUS | Status: AC
  Administered 2020-03-08: 10 [IU] via SUBCUTANEOUS

## 2020-03-08 MED ORDER — ALBUTEROL SULFATE (2.5 MG/3ML) 0.083% IN NEBU: 2.5000 mg | INHALATION_SOLUTION | RESPIRATORY_TRACT | Status: DC | PRN

## 2020-03-08 MED ORDER — DEXTROSE 50 % IV SOLN
50.0000 mL | Freq: Once | INTRAVENOUS | Status: AC
  Administered 2020-03-08: 50 mL via INTRAVENOUS
  Filled 2020-03-08: qty 50

## 2020-03-08 MED ORDER — IPRATROPIUM-ALBUTEROL 0.5-2.5 (3) MG/3ML IN SOLN
3.0000 mL | Freq: Four times a day (QID) | RESPIRATORY_TRACT | Status: DC
  Administered 2020-03-08 – 2020-03-11 (×14): 3 mL via RESPIRATORY_TRACT
  Filled 2020-03-08 (×14): qty 3

## 2020-03-08 MED ORDER — METHYLPREDNISOLONE SODIUM SUCC 40 MG IJ SOLR
40.0000 mg | Freq: Every day | INTRAMUSCULAR | Status: DC
  Administered 2020-03-08 – 2020-03-13 (×6): 40 mg via INTRAVENOUS
  Filled 2020-03-08 (×6): qty 1

## 2020-03-08 MED ORDER — HEPARIN SODIUM (PORCINE) 5000 UNIT/ML IJ SOLN
5000.0000 [IU] | Freq: Three times a day (TID) | INTRAMUSCULAR | Status: DC
  Administered 2020-03-08 – 2020-03-15 (×19): 5000 [IU] via SUBCUTANEOUS
  Filled 2020-03-08 (×19): qty 1

## 2020-03-08 MED ORDER — DEXTROSE-NACL 5-0.9 % IV SOLN: INTRAVENOUS | Status: DC

## 2020-03-08 MED ORDER — FAMOTIDINE IN NACL 20-0.9 MG/50ML-% IV SOLN
20.0000 mg | Freq: Two times a day (BID) | INTRAVENOUS | Status: DC
  Administered 2020-03-08 – 2020-03-11 (×7): 20 mg via INTRAVENOUS
  Filled 2020-03-08 (×8): qty 50

## 2020-03-08 NOTE — Progress Notes (Signed)
Pt placed on BiPAP, AVAPS mode, 12-20/6, rt 16, 30%.

## 2020-03-08 NOTE — ED Notes (Signed)
Dr. Vanita Panda to bedside

## 2020-03-08 NOTE — Progress Notes (Signed)
eLink Physician-Brief Progress Note Patient Name: Anthony Nelson DOB: 08-Mar-1961 MRN: [id number]   Date of Service  03/08/2020  HPI/Events of Note  Notified of hypoglycemia requiring D 50 Patient still in ED and unable to camera in. On review on notes patient looks dry  eICU Interventions  Ordered to start D5 NS at 75 cc/hr        [place] 03/08/2020, 11:50 PM

## 2020-03-08 NOTE — ED Notes (Signed)
Admitting MD notified on patient's hypoglycemia.

## 2020-03-08 NOTE — ED Notes (Signed)
Reported cbg to rn

## 2020-03-08 NOTE — ED Provider Notes (Addendum)
[facility]GENCY DEPARTMENT Provider Note   CSN: [id number] Arrival date & time: 03/08/20  1123     History Chief Complaint  Patient presents with  . Altered Mental Status  . Shortness of Breath    Anthony Nelson is a 59 y.o. male.  HPI    Patient presents with concern of altered mental status.  The patient self cannot provide any details of his history, level 5 caveat secondary to acuity of condition. Per EMS the patient has been listless today, and on their arrival he was found to have hypoxia, with a saturation of 44%. Saturation improved in the 90% range with nonrebreather mask. No reported change in medication, activity.  No report of fever, fall. No known coronavirus infection.  Past Medical History:  Diagnosis Date  . Bronchitis   . COPD (chronic obstructive pulmonary disease) ([place])   . Diastolic heart failure ([place])   . GERD (gastroesophageal reflux disease)   . Pulmonary hypertension ([place])   . 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 Respiratory failure with hypoxia [facility])     Patient Active Problem List   Diagnosis Date Noted  . Elevated CEA 08/14/2019  . Weight loss 08/11/2019  . Abnormal findings on diagnostic imaging of lung 08/11/2019  . Chronic diastolic heart failure ([place]) 04/25/2019  . Chronic respiratory failure with hypoxia ([place]) 04/25/2019  . 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 Cor pulmonale (chronic) ([place]) 03/19/2018  . COPD GOLD IV still smoking 03/18/2018  . Leg swelling 02/08/2018  . Cigarette smoker 02/08/2018    Past Surgical History:  Procedure Laterality Date  . HERNIA REPAIR         Family History  Problem Relation Age of Onset  . Cancer Mother   . Cancer Father   . CAD Father   . Heart failure Father   . Diabetes Maternal Grandmother   . Kidney disease Maternal Grandmother   . Kidney cancer Paternal  Grandmother   . Heart attack Paternal Grandfather     Social History   Tobacco Use  . Smoking status: Current Every Day Smoker    Packs/day: 0.10    Years: 44.00    Pack years: 4.40    Types: Cigarettes  . Smokeless tobacco: Never Used  Vaping Use  . Vaping Use: Never used  Substance Use Topics  . Alcohol use: Not Currently    Comment: Socially   . Drug use: No    Home Medications Prior to Admission medications   Medication Sig Start Date End Date Taking? Authorizing Provider  albuterol (PROVENTIL) (2.5 MG/3ML) 0.083% nebulizer solution Take 3 mLs (2.5 mg total) by nebulization every 6 (six) hours as needed for wheezing or shortness of breath. 01/21/20 01/20/21  Fulp, Cammie, MD  albuterol (VENTOLIN HFA) 108 (90 Base) MCG/ACT inhaler Inhale 2 puffs into the lungs every 6 (six) hours as needed for wheezing or shortness of breath. 01/21/20   Fulp, Cammie, MD  budesonide-formoterol (SYMBICORT) 160-4.5 MCG/ACT inhaler Inhale 2 puffs into the lungs 2 (two) times daily. 01/21/20   Fulp, Cammie, MD  docusate sodium (COLACE) 100 MG capsule Take 1 capsule (100 mg total) by mouth daily. 05/01/19   Swayze, Ava, DO  furosemide (LASIX) 20 MG tablet Take 1 tablet (20 mg total) by mouth daily. 01/21/20 01/20/21  Fulp, Ander Gaster, MD  nicotine polacrilex (NICORETTE MINI) 2 MG lozenge Use 3-4 times daily to quit smoking Patient not taking: Reported  on 11/12/2019 06/24/19   Elsie Stain, MD  OXYGEN 2lpm as needed per pt  [facility]    [provider]  potassium chloride SA (KLOR-CON) 20 MEQ tablet Take 1 tablet (20 mEq total) by mouth daily. 06/24/19   Elsie Stain, MD  predniSONE (DELTASONE) 10 MG tablet Take 4 tablets daily for 5 days then stop Patient not taking: Reported on 11/12/2019 08/11/19   Elsie Stain, MD  umeclidinium bromide (INCRUSE ELLIPTA) 62.5 MCG/INH AEPB Inhale 1 puff into the lungs daily. 05/21/19   Fulp, Cammie, MD    Allergies    Erythromycin  Review of Systems    Review of Systems  Unable to perform ROS: Acuity of condition    Physical Exam Updated Vital Signs BP 98/68   Pulse (!) 108   Temp 98.3 F (36.8 C) (Oral)   Resp (!) 23   SpO2 99%   Physical Exam Vitals and nursing note reviewed.  Constitutional:      General: He is in acute distress.     Appearance: He is ill-appearing.     Comments: Sickly appearing thin adult male listless  HENT:     Head: Normocephalic and atraumatic.  Eyes:     Conjunctiva/sclera: Conjunctivae normal.  Cardiovascular:     Rate and Rhythm: Regular rhythm. Tachycardia present.  Pulmonary:     Effort: Tachypnea present.     Breath sounds: Decreased air movement present. Decreased breath sounds present. No wheezing.  Abdominal:     General: There is no distension.  Skin:    General: Skin is warm and dry.  Neurological:     Comments: Patient listless, does not follow commands.  Psychiatric:        Cognition and Memory: Cognition is impaired.      ED Results / Procedures / Treatments   Labs (all labs ordered are listed, but only abnormal results are displayed) Labs Reviewed  COMPREHENSIVE METABOLIC PANEL - Abnormal; Notable for the following components:      Result Value   Sodium 132 (*)    Potassium 6.2 (*)    Chloride 80 (*)    CO2 40 (*)    Glucose, Bld 128 (*)    BUN 28 (*)    Creatinine, Ser 1.31 (*)    Calcium 8.4 (*)    Total Protein 5.5 (*)    AST 113 (*)    ALT 122 (*)    Total Bilirubin 1.6 (*)    GFR calc non Af Amer 59 (*)    All other components within normal limits  CBC WITH DIFFERENTIAL/PLATELET - Abnormal; Notable for the following components:   MCV 108.1 (*)    Platelets 146 (*)    Lymphs Abs 0.2 (*)    All other components within normal limits  BRAIN NATRIURETIC PEPTIDE - Abnormal; Notable for the following components:   B Natriuretic Peptide 1,235.5 (*)    All other components within normal limits  BLOOD GAS, ARTERIAL - Abnormal; Notable for the following  components:   pH, Arterial 7.183 (*)    pCO2 arterial >120.0 (*)    pO2, Arterial 128 (*)    Bicarbonate 54.6 (*)    Acid-Base Excess 25.7 (*)    All other components within normal limits  SARS CORONAVIRUS 2 BY RT PCR (HOSPITAL ORDER, [facility])  URINALYSIS, ROUTINE W REFLEX MICROSCOPIC  CBC  MAGNESIUM  COMPREHENSIVE METABOLIC PANEL  PHOSPHORUS  BLOOD GAS, VENOUS    EKG  EKG Interpretation  Date/Time:  Tuesday March 08 2020 11:43:56 EDT Ventricular Rate:  110 PR Interval:    QRS Duration: 104 QT Interval:  334 QTC Calculation: 452 R Axis:   130 Text Interpretation: Sinus tachycardia Biatrial enlargement ST-t wave abnormality Abnormal ECG Confirmed by Carmin Muskrat [phone]) on 03/08/2020 4:08:07 PM   Radiology CT Head Wo Contrast  Result Date: 03/08/2020 CLINICAL DATA:  Mental status change EXAM: CT HEAD WITHOUT CONTRAST TECHNIQUE: Contiguous axial images were obtained from the base of the skull through the vertex without intravenous contrast. COMPARISON:  None. FINDINGS: Brain: There is no acute intracranial hemorrhage, mass effect, or edema. Gray-white differentiation is preserved. There is no extra-axial fluid collection. Ventricles and sulci are within normal limits in size and configuration. Vascular: There is atherosclerotic calcification at the skull base. Skull: Calvarium is unremarkable. Sinuses/Orbits: No acute finding. Other: None. IMPRESSION: No acute intracranial abnormality. Electronically Signed   By: Macy Mis M.D.   On: 03/08/2020 13:18   DG Chest Port 1 View  Result Date: 03/08/2020 CLINICAL DATA:  Shortness of breath. EXAM: PORTABLE CHEST 1 VIEW COMPARISON:  April 24, 2019. FINDINGS: Stable cardiomediastinal silhouette. No pneumothorax or pleural effusion is noted. Both lungs are clear. The visualized skeletal structures are unremarkable. IMPRESSION: No active disease. Electronically Signed   By: Marijo Conception M.D.   On:  03/08/2020 12:10    Procedures Procedures (including critical care time)  Medications Ordered in ED Medications  docusate sodium (COLACE) capsule 100 mg (has no administration in time range)  polyethylene glycol (MIRALAX / GLYCOLAX) packet 17 g (has no administration in time range)  heparin injection 5,000 Units (has no administration in time range)  ondansetron (ZOFRAN) injection 4 mg (has no administration in time range)  famotidine (PEPCID) IVPB 20 mg premix (has no administration in time range)  ipratropium-albuterol (DUONEB) 0.5-2.5 (3) MG/3ML nebulizer solution 3 mL (3 mLs Nebulization Given 03/08/20 1556)  albuterol (PROVENTIL) (2.5 MG/3ML) 0.083% nebulizer solution 2.5 mg (has no administration in time range)  sodium chloride 0.9 % bolus 1,000 mL (1,000 mLs Intravenous New Bag/Given 03/08/20 1430)    ED Course  I have reviewed the triage vital signs and the nursing notes.  Pertinent labs & imaging results that were available during my care of the patient were reviewed by me and considered in my medical decision making (see chart for details).  Immediately after arrival, receiving report from our EMS colleagues, on initial evaluation the patient is found to have substantial hypoxia. Patient was seemingly, transiently, on medical air.  After being changed to supplemental oxygen, via nonrebreather mask the patient had substantial improvement in his oxygenation levels.  Update:, Now on nasal cannula, 5 L, the patient saturation 95%.  He is moving extremities minimally spontaneously, minimally.  Update: Patient now more listless, has been progressively more so.  Continues to respond only to painful stimuli.  With concern for retention, as on arrival, the patient will attempt BiPAP.  Update:, ABG notable for severe acidosis, CO2 greater than 100, consistent with, dioxide retention patient starting BiPAP.  4:06 PM Patient now speaking, has had substantial improvement, though continues  to require BiPAP. X-ray reassuring, no pleural effusion, no congestion, the patient's habitus is more consistent with hypokalemia, though he does have elevated BNP concerning for his history of heart failure. Patient presentation consistent with likely multifactorial episode of respiratory failure secondary to COPD/CHF, and now requires admission to the intensive care unit for further monitoring, management.   Final  Clinical Impression(s) / ED Diagnoses Final diagnoses:  Acute on chronic respiratory failure with hypoxia and hypercapnia ([place])   MDM Number of Diagnoses or Management Options Acute on chronic respiratory failure with hypoxia and hypercapnia ([place]): established, worsening   Amount and/or Complexity of Data Reviewed Clinical lab tests: reviewed Tests in the radiology section of CPT: reviewed Tests in the medicine section of CPT: reviewed Decide to obtain previous medical records or to obtain history from someone other than the patient: yes Obtain history from someone other than the patient: yes Review and summarize past medical records: yes Discuss the patient with other providers: yes Independent visualization of images, tracings, or specimens: yes  Risk of Complications, Morbidity, and/or Mortality Presenting problems: high Diagnostic procedures: high Management options: high  Critical Care Total time providing critical care: 30-74 minutes (40)  Patient Progress Patient progress: stable    Carmin Muskrat, MD 03/08/20 1609    Carmin Muskrat, MD 03/23/20 1958

## 2020-03-08 NOTE — Consult Note (Signed)
NAME:  Anthony Nelson, MRN:  [id number], DOB:  1961-01-13, LOS: 0 ADMISSION DATE:  03/08/2020, CONSULTATION DATE:  03/08/2020 REFERRING MD: Vanita Panda , CHIEF COMPLAINT:  Acute on chronic respiratory failure   Brief History   59 year old male current every day smoker with history of COPD, PAH,  and CHF  found listless at home 03/08/2020 EMS found with sats of 45% on RA. Initially placed on NRB with adequate sats, but then decompensated in the ED requiring BiPAP. ABG revealed pH of 7.183 and CO2 of > 120. PCCM were asked to admit and manage care.  History of present illness   59 year old male current every day smoker with history of COPD and CHF  found listless at home 03/08/2020 EMS found with sats of 45% on RA. Initially placed on NRB with adequate sats, but then decompensated in the ED requiring BiPAP. ABG revealed pH of 7.183 and CO2 of > 120. PCCM were asked to admit and manage care. Pt was initially obtunded, but was alert and appropriate after a few hours of BiPAP therapy. Pt was afebrile with normal WBC, BP was soft . PCCM was asked to admit and manage care.   Past Medical History   Past Medical History:  Diagnosis Date  . Bronchitis   . COPD (chronic obstructive pulmonary disease) ([place])   . Diastolic heart failure ([place])   . GERD (gastroesophageal reflux disease)   . Pulmonary hypertension ([place])   . 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 Respiratory failure with hypoxia ([place])     Significant Hospital Events   03/08/2020 Admission with hypercarbic respiratory failure  Consults:  03/08/2020>> PCCM  Procedures:    Significant Diagnostic Tests:  03/08/2020 CT head >> No acute intracranial abnormality  Micro Data:    Antimicrobials:  None  Interim history/subjective:  Awake and alert with sats of 99% on BiPAP  Objective   Blood pressure 103/73, pulse 91, temperature 98.3 F (36.8 C), temperature source Oral, resp. rate 16, SpO2 100  %.    FiO2 (%):  [30 %] 30 %  No intake or output data in the 24 hours ending 03/08/20 1652 There were no vitals filed for this visit.  Examination: General: Awake and alert elderly male on BiPAP, in NAD HENT: NCAT, Temporal wasting, BiPAP mask in place, NO LAD, No JVD Lungs: Bilateral chest excursion, Coarse throughout, Diminished per bases Cardiovascular:  S1, S2, RRR, No RMG, ST per tele Abdomen: Soft, NT, ND, BS + Extremities: No obvious deformities, warm and dry, brisk cap refill Neuro: MAE x 4, A&O x 3 GU: Not assessed  Resolved Hospital Problem list     Assessment & Plan:  Acute on Chronic Respiratory Failure ( Hypoxemia, hypercarbia) COPD PAH Plan Continue BiPAP for now VBG follow up Saturation goals are > 88% Wean Oxygen as able ABG prn CXR in am and prn NPO No sedation Trend Mag ( Goal > 2)>> replete as needed Duonebs Q 6  Albuterol nebs Q 2 prn  CHF>> BNP is 1235 CXR is clear Appears dry Plan Trend BNP EKG in am  CXR in am    Hyperkalemia Plan Will treat now Follow BMP at 2200 and in am  EKG  Acute Kidney Injury Creatinine 1.31  Plan Monitor UO Trend BMET Avoid nephrotoxic medications Maintain renal perfusion > 65 mm HG    Will admit to ICU overnight for close monitoring. If remains stable and Co2 continues to down trend, will  have [facility] pick patient up 9/8   Magdalen Spatz, MSN, AGACNP-BC [place] for personal pager PCCM on call pager [phone] 03/08/2020 5:09 PM   Best practice:  Diet: NPO Pain/Anxiety/Delirium protocol (if indicated): NA VAP protocol (if indicated): NA DVT prophylaxis: Heparin GI prophylaxis: Pepcid Glucose control: CBG and SSI Mobility: BR Code Status: FC Family Communication: No family at bedside Disposition: ICU  Labs   CBC: Recent Labs  Lab 03/08/20 1141  WBC 6.4  NEUTROABS 5.2  HGB 14.5  HCT 48.1  MCV 108.1*  PLT 146*    Basic Metabolic  Panel: Recent Labs  Lab 03/08/20 1141  NA 132*  K 6.2*  CL 80*  CO2 40*  GLUCOSE 128*  BUN 28*  CREATININE 1.31*  CALCIUM 8.4*   GFR: CrCl cannot be calculated (Unknown ideal weight.). Recent Labs  Lab 03/08/20 1141  WBC 6.4    Liver Function Tests: Recent Labs  Lab 03/08/20 1141  AST 113*  ALT 122*  ALKPHOS 63  BILITOT 1.6*  PROT 5.5*  ALBUMIN 3.6   No results for input(s): LIPASE, AMYLASE in the last 168 hours. No results for input(s): AMMONIA in the last 168 hours.  ABG    Component Value Date/Time   PHART 7.183 (LL) 03/08/2020 1445   PCO2ART >120.0 (HH) 03/08/2020 1445   PO2ART 128 (H) 03/08/2020 1445   HCO3 54.6 (H) 03/08/2020 1445   TCO2 35 (H) 02/08/2018 0839   O2SAT 98.0 03/08/2020 1445     Coagulation Profile: No results for input(s): INR, PROTIME in the last 168 hours.  Cardiac Enzymes: No results for input(s): CKTOTAL, CKMB, CKMBINDEX, TROPONINI in the last 168 hours.  HbA1C: No results found for: HGBA1C  CBG: No results for input(s): GLUCAP in the last 168 hours.  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   Surgical History    Past Surgical History:  Procedure Laterality Date  . HERNIA REPAIR       Social History   reports that he has been smoking cigarettes. He has a 4.40 pack-year smoking history. He has never used smokeless tobacco. He reports previous alcohol use. He reports that he does not use drugs.   Family History   His family history includes CAD in his father; Cancer in his father and mother; Diabetes in his maternal grandmother; Heart attack in his paternal grandfather; Heart failure  in his father; Kidney cancer in his paternal grandmother; Kidney disease in his maternal grandmother.   Allergies Allergies  Allergen Reactions  . Erythromycin      Home Medications  Prior to Admission medications   Medication Sig Start Date End Date Taking? Authorizing Provider  albuterol (PROVENTIL) (2.5 MG/3ML) 0.083% nebulizer solution Take 3 mLs (2.5 mg total) by nebulization every 6 (six) hours as needed for wheezing or shortness of breath. 01/21/20 01/20/21  Fulp, Cammie, MD  albuterol (VENTOLIN HFA) 108 (90 Base) MCG/ACT inhaler Inhale 2 puffs into the lungs every 6 (six) hours as needed for wheezing or shortness of breath. 01/21/20   Fulp, Cammie, MD  budesonide-formoterol (SYMBICORT) 160-4.5 MCG/ACT inhaler Inhale 2 puffs into the lungs 2 (two) times daily. 01/21/20   Fulp, Ander Gaster, MD  docusate sodium (  COLACE) 100 MG capsule Take 1 capsule (100 mg total) by mouth daily. 05/01/19   Swayze, Ava, DO  furosemide (LASIX) 20 MG tablet Take 1 tablet (20 mg total) by mouth daily. 01/21/20 01/20/21  [name], MD  nicotine polacrilex (NICORETTE MINI) 2 MG lozenge Use 3-4 times daily to quit smoking Patient not taking: Reported on 11/12/2019 06/24/19   Elsie Stain, MD  OXYGEN 2lpm as needed per pt  [facility]    [provider]  potassium chloride SA (KLOR-CON) 20 MEQ tablet Take 1 tablet (20 mEq total) by mouth daily. 06/24/19   Elsie Stain, MD  predniSONE (DELTASONE) 10 MG tablet Take 4 tablets daily for 5 days then stop Patient not taking: Reported on 11/12/2019 08/11/19   Elsie Stain, MD  umeclidinium bromide (INCRUSE ELLIPTA) 62.5 MCG/INH AEPB Inhale 1 puff into the lungs daily. 05/21/19   [name], MD     Critical care time: 60 minutes    Magdalen Spatz, MSN, AGACNP-BC [place] for personal pager PCCM on call pager [phone]

## 2020-03-08 NOTE — ED Triage Notes (Addendum)
Pt arrives from home where he lives with wife and suddenly become altered and not as responsive- on ems arrival pt had o2 sats in 40's on 2L [place] ems placed on 15 liters NRB and patient came up into low 90's.  Hx of copd & CHF.

## 2020-03-08 NOTE — ED Notes (Addendum)
RN has request that secertary to page critical care MD for pt CBG OF 35

## 2020-03-09 ENCOUNTER — Inpatient Hospital Stay (HOSPITAL_COMMUNITY): Payer: Medicaid Other

## 2020-03-09 ENCOUNTER — Other Ambulatory Visit: Payer: Self-pay

## 2020-03-09 ENCOUNTER — Other Ambulatory Visit (HOSPITAL_COMMUNITY): Payer: Medicaid Other

## 2020-03-09 DIAGNOSIS — I5031 Acute diastolic (congestive) heart failure: Secondary | ICD-10-CM

## 2020-03-09 DIAGNOSIS — N179 Acute kidney failure, unspecified: Secondary | ICD-10-CM

## 2020-03-09 LAB — BASIC METABOLIC PANEL
Anion gap: 11 (ref 5–15)
BUN: 22 mg/dL — ABNORMAL HIGH (ref 6–20)
CO2: 46 mmol/L — ABNORMAL HIGH (ref 22–32)
Calcium: 8.5 mg/dL — ABNORMAL LOW (ref 8.9–10.3)
Chloride: 79 mmol/L — ABNORMAL LOW (ref 98–111)
Creatinine, Ser: 0.74 mg/dL (ref 0.61–1.24)
GFR calc Af Amer: >60 mL/min (ref >60)
GFR calc non Af Amer: >60 mL/min (ref >60)
Glucose, Bld: 131 mg/dL — ABNORMAL HIGH (ref 70–99)
Potassium: 4.1 mmol/L (ref 3.5–5.1)
Sodium: 136 mmol/L (ref 135–145)

## 2020-03-09 LAB — CBC
HCT: 43.5 % (ref 39.0–52.0)
Hemoglobin: 13.3 g/dL (ref 13.0–17.0)
MCH: 32.4 pg (ref 26.0–34.0)
MCHC: 30.6 g/dL (ref 30.0–36.0)
MCV: 106.1 fL — ABNORMAL HIGH (ref 80.0–100.0)
Platelets: 147 10*3/uL — ABNORMAL LOW (ref 150–400)
RBC: 4.1 MIL/uL — ABNORMAL LOW (ref 4.22–5.81)
RDW: 13.4 % (ref 11.5–15.5)
WBC: 7.1 10*3/uL (ref 4.0–10.5)
nRBC: 0 % (ref 0.0–0.2)

## 2020-03-09 LAB — BLOOD GAS, ARTERIAL
Acid-Base Excess: 25.7 mmol/L — ABNORMAL HIGH (ref 0.0–2.0)
Bicarbonate: 52.9 mmol/L — ABNORMAL HIGH (ref 20.0–28.0)
Drawn by: 56037
FIO2: 40
O2 Saturation: 97.5 %
Patient temperature: 36.8
pCO2 arterial: 97.7 mmHg (ref 32.0–48.0)
pH, Arterial: 7.352 (ref 7.350–7.450)
pO2, Arterial: 98.7 mmHg (ref 83.0–108.0)

## 2020-03-09 LAB — ECHOCARDIOGRAM COMPLETE
Area-P 1/2: 3.08 cm2
S' Lateral: 2.6 cm

## 2020-03-09 LAB — CBG MONITORING, ED
Glucose-Capillary: 122 mg/dL — ABNORMAL HIGH (ref 70–99)
Glucose-Capillary: 139 mg/dL — ABNORMAL HIGH (ref 70–99)
Glucose-Capillary: 80 mg/dL (ref 70–99)
Glucose-Capillary: 85 mg/dL (ref 70–99)
Glucose-Capillary: 95 mg/dL (ref 70–99)

## 2020-03-09 LAB — MAGNESIUM: Magnesium: 1.5 mg/dL — ABNORMAL LOW (ref 1.7–2.4)

## 2020-03-09 LAB — GLUCOSE, CAPILLARY
Glucose-Capillary: 76 mg/dL (ref 70–99)
Glucose-Capillary: 147 mg/dL — ABNORMAL HIGH (ref 70–99)
Glucose-Capillary: 169 mg/dL — ABNORMAL HIGH (ref 70–99)

## 2020-03-09 LAB — PHOSPHORUS: Phosphorus: 3.3 mg/dL (ref 2.5–4.6)

## 2020-03-09 MED ORDER — HALOPERIDOL LACTATE 5 MG/ML IJ SOLN: 1.0000 mg | Freq: Four times a day (QID) | INTRAMUSCULAR | Status: DC | PRN

## 2020-03-09 MED ORDER — MAGNESIUM SULFATE 4 GM/100ML IV SOLN
4.0000 g | Freq: Once | INTRAVENOUS | Status: AC
  Administered 2020-03-09: 4 g via INTRAVENOUS
  Filled 2020-03-09: qty 100

## 2020-03-09 MED ORDER — ACETAZOLAMIDE ER 500 MG PO CP12
500.0000 mg | ORAL_CAPSULE | Freq: Two times a day (BID) | ORAL | Status: AC
  Administered 2020-03-09 – 2020-03-11 (×5): 500 mg via ORAL
  Filled 2020-03-09 (×6): qty 1

## 2020-03-09 MED ORDER — SODIUM CHLORIDE 0.9 % IV BOLUS
1000.0000 mL | Freq: Once | INTRAVENOUS | Status: AC
  Administered 2020-03-09: 1000 mL via INTRAVENOUS

## 2020-03-09 MED ORDER — LORAZEPAM 2 MG/ML IJ SOLN
0.5000 mg | Freq: Once | INTRAMUSCULAR | Status: AC | PRN
  Administered 2020-03-09: 1 mg via INTRAVENOUS
  Filled 2020-03-09: qty 1

## 2020-03-09 MED ORDER — NICOTINE 21 MG/24HR TD PT24
21.0000 mg | MEDICATED_PATCH | Freq: Every day | TRANSDERMAL | Status: DC
  Administered 2020-03-10 – 2020-03-15 (×6): 21 mg via TRANSDERMAL
  Filled 2020-03-09 (×6): qty 1

## 2020-03-09 NOTE — Progress Notes (Signed)
Pt was taken off of BIPAP and placed on 2L [place]. Pt is answering questions appropriately at this time. RN aware.

## 2020-03-09 NOTE — Progress Notes (Signed)
  Echocardiogram 2D Echocardiogram with 3D has been performed.  Anthony Nelson M 03/09/2020, 3:27 PM

## 2020-03-09 NOTE — ED Notes (Signed)
While RN was in another room,pt was pulling off BIPAP , requesting to go home, pt threatening staff regarding being able to leave, RN Delsa Sale asked secertary to page Critical care MD to inform them , pt requesting to leave. RN awaiting call from MD.  A steel knife was also found in the pt's bed by RN Delsa Sale and EMT Jesus, knife has been given to security

## 2020-03-09 NOTE — Progress Notes (Addendum)
NAME:  Anthony Nelson, MRN:  [id number], DOB:  [birth year], LOS: 1 ADMISSION DATE:  03/08/2020, CONSULTATION DATE:  03/08/2020 REFERRING MD: Vanita Panda , CHIEF COMPLAINT:  Acute on chronic respiratory failure   Brief History   59 year old male current every day smoker with history of COPD, PAH,  and CHF  found listless at home 03/08/2020 EMS found with sats of 45% on RA. Initially placed on NRB with adequate sats, but then decompensated in the ED requiring BiPAP. ABG revealed pH of 7.183 and CO2 of > 120. PCCM were asked to admit  Past Medical History   Past Medical History:  Diagnosis Date  . Bronchitis   . COPD (chronic obstructive pulmonary disease) ([place])   . Diastolic heart failure ([place])   . GERD (gastroesophageal reflux disease)   . Pulmonary hypertension ([place])   . 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 Respiratory failure with hypoxia ([place])     Significant Hospital Events   03/08/2020 Admission with hypercarbic respiratory failure  Consults:  03/08/2020>> PCCM  Procedures:    Significant Diagnostic Tests:  03/08/2020 CT head >> No acute intracranial abnormality  Micro Data:  9/7 Covid negative  Antimicrobials:  None  Interim history/subjective:  Patient was agitated, aggressive and restless last night, ripping off BiPAP multiple times. His pH has improved but continue to have PCO2 in the high 90s with serum bicarbonate of 46  Objective   Blood pressure 100/72, pulse 88, temperature 98.3 F (36.8 C), temperature source Oral, resp. rate 18, SpO2 97 %.    FiO2 (%):  [30 %] 30 %   Intake/Output Summary (Last 24 hours) at 03/09/2020 1110 Last data filed at 03/08/2020 1849 Gross per 24 hour  Intake 1050 ml  Output --  Net 1050 ml   There were no vitals filed for this visit.  Examination: General: Chronically ill appearing elderly cachectic male, on BiPAP HENT: NCAT, Temporal wasting, No JVD Lungs: Reduced air entry all over, no  wheezes Cardiovascular:  S1, S2, RRR, No RMG, Abdomen: Soft, NT, ND, BS + Extremities: No obvious deformities, warm and dry, brisk cap refill Neuro: MAE x 4, A&O x 3 Skin: Chronic changes, no rash  Resolved Hospital Problem list   Acute kidney injury Hyperkalemia Assessment & Plan:  Acute on Chronic hypoxic/hypercapnic respiratory Failure Acute COPD exacerbation PAH  Patient's pH has improved but continue to have hypercapnia with PCO2 97, much improved from more than 120 We will give him trial of being off BiPAP Saturation goals are > 88% Wean Oxygen as able Continue nebs and Solu-Medrol  Acute metabolic encephalopathy, due to hypercarbia Patient was agitated and restless last night Prn haldol is ordered  Hyperkalemia, due to acute respiratory acidosis.  Improved Monitor closely  Acute Kidney Injury, improved Monitor serum creatinine Avoid nephrotoxic medications Maintain renal perfusion > 65 mm HG   Acute on chronic respiratory acidosis with severe metabolic alkalosis Will give 1 L of normal saline bolus Started on acetazolamide  Recurrent hypoglycemia Patient became hypoglycemic twice last night, stop sliding scale insulin Monitor capillary glucose Resume diet once off BiPAP  Best practice:  Diet: Trial of clear liquid diet after being off BiPAP Pain/Anxiety/Delirium protocol (if indicated): NA VAP protocol (if indicated): NA DVT prophylaxis: Heparin GI prophylaxis: Pepcid Glucose control: N/A Mobility: BR Code Status: Full code Family Communication: No family at bedside Disposition: Transfer to PCU  Labs   CBC: Recent Labs  Lab 03/08/20 1141 03/08/20 1615 03/09/20  0435  WBC 6.4 7.6 7.1  NEUTROABS 5.2  --   --   HGB 14.5 15.0 13.3  HCT 48.1 49.9 43.5  MCV 108.1* 108.5* 106.1*  PLT 146* 132* 147*    Basic Metabolic Panel: Recent Labs  Lab 03/08/20 1141 03/08/20 1615 03/09/20 0435  NA 132* 133* 136  K 6.2* 5.1 4.1  CL 80* 76* 79*  CO2 40*  44* 46*  GLUCOSE 128* 89 131*  BUN 28* 30* 22*  CREATININE 1.31* 1.08 0.74  CALCIUM 8.4* 8.6* 8.5*  MG  --  1.7 1.5*  PHOS  --  5.6* 3.3   GFR: CrCl cannot be calculated (Unknown ideal weight.). Recent Labs  Lab 03/08/20 1141 03/08/20 1615 03/09/20 0435  WBC 6.4 7.6 7.1    Liver Function Tests: Recent Labs  Lab 03/08/20 1141 03/08/20 1615  AST 113* 102*  ALT 122* 134*  ALKPHOS 63 64  BILITOT 1.6* 0.9  PROT 5.5* 6.5  ALBUMIN 3.6 3.9   No results for input(s): LIPASE, AMYLASE in the last 168 hours. No results for input(s): AMMONIA in the last 168 hours.  ABG    Component Value Date/Time   PHART 7.352 03/09/2020 0440   PCO2ART 97.7 (HH) 03/09/2020 0440   PO2ART 98.7 03/09/2020 0440   HCO3 52.9 (H) 03/09/2020 0440   TCO2 35 (H) 02/08/2018 0839   O2SAT 97.5 03/09/2020 0440     Coagulation Profile: No results for input(s): INR, PROTIME in the last 168 hours.  Cardiac Enzymes: No results for input(s): CKTOTAL, CKMB, CKMBINDEX, TROPONINI in the last 168 hours.  HbA1C: Hgb A1c MFr Bld  Date/Time Value Ref Range Status  03/08/2020 07:00 PM 5.9 (H) 4.8 - 5.6 % Final    Comment:    (NOTE) Pre diabetes:          5.7%-6.4%  Diabetes:              >6.4%  Glycemic control for   <7.0% adults with diabetes     CBG: Recent Labs  Lab 03/09/20 0001 03/09/20 0015 03/09/20 0250 03/09/20 0547 03/09/20 0756  GLUCAP 139* 122* 85 80 95    Review of Systems:   Unable  Past Medical History  He,  has a past medical history of Bronchitis, COPD (chronic obstructive pulmonary disease) ([place]), Diastolic heart failure ([place]), GERD (gastroesophageal reflux disease), Pulmonary hypertension ([place]), 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   Surgical History    Past Surgical History:  Procedure Laterality Date  . HERNIA REPAIR       Social History   reports that he has been  smoking cigarettes. He has a 4.40 pack-year smoking history. He has never used smokeless tobacco. He reports previous alcohol use. He reports that he does not use drugs.   Family History   His family history includes CAD in his father; Cancer in his father and mother; Diabetes in his maternal grandmother; Heart attack in his paternal grandfather; Heart failure in his father; Kidney cancer in his paternal grandmother; Kidney disease in his maternal grandmother.   Allergies Allergies  Allergen Reactions  . Erythromycin Other (See Comments)    Allergy from childhood- does not take it     Home Medications  Prior to Admission medications   Medication Sig Start Date End Date Taking? Authorizing Provider  albuterol (PROVENTIL) (2.5 MG/3ML) 0.083% nebulizer solution Take 3 mLs (2.5 mg total) by nebulization every 6 (six) hours as needed  for wheezing or shortness of breath. 01/21/20 01/20/21  Fulp, Ander Gaster, MD  albuterol (VENTOLIN HFA) 108 (90 Base) MCG/ACT inhaler Inhale 2 puffs into the lungs every 6 (six) hours as needed for wheezing or shortness of breath. 01/21/20   Fulp, Cammie, MD  budesonide-formoterol (SYMBICORT) 160-4.5 MCG/ACT inhaler Inhale 2 puffs into the lungs 2 (two) times daily. 01/21/20   Fulp, Cammie, MD  docusate sodium (COLACE) 100 MG capsule Take 1 capsule (100 mg total) by mouth daily. 05/01/19   Swayze, Ava, DO  furosemide (LASIX) 20 MG tablet Take 1 tablet (20 mg total) by mouth daily. 01/21/20 01/20/21  [name], MD  nicotine polacrilex (NICORETTE MINI) 2 MG lozenge Use 3-4 times daily to quit smoking Patient not taking: Reported on 11/12/2019 06/24/19   Elsie Stain, MD  OXYGEN 2lpm as needed per pt  [facility]    [provider]  potassium chloride SA (KLOR-CON) 20 MEQ tablet Take 1 tablet (20 mEq total) by mouth daily. 06/24/19   Elsie Stain, MD  predniSONE (DELTASONE) 10 MG tablet Take 4 tablets daily for 5 days then stop Patient not taking: Reported on  11/12/2019 08/11/19   Elsie Stain, MD  umeclidinium bromide (INCRUSE ELLIPTA) 62.5 MCG/INH AEPB Inhale 1 puff into the lungs daily. 05/21/19   Fulp, Ander Gaster, MD     Total critical care time: 32 minutes  Performed by: [place] care time was exclusive of separately billable procedures and treating other patients.   Critical care was necessary to treat or prevent imminent or life-threatening deterioration.   Critical care was time spent personally by me on the following activities: development of treatment plan with patient and/or surrogate as well as nursing, discussions with consultants, evaluation of patient's response to treatment, examination of patient, obtaining history from patient or surrogate, ordering and performing treatments and interventions, ordering and review of laboratory studies, ordering and review of radiographic studies, pulse oximetry and re-evaluation of patient's condition.   Jacky Kindle MD Critical care physician [facility]  Pager: [phone] Mobile: [phone]

## 2020-03-09 NOTE — H&P (Signed)
Please see Consult note dictated by Eric Form as an H&P.  Jacky Kindle MD Critical care physician [facility]  Pager: [phone] Mobile: [phone]

## 2020-03-09 NOTE — Progress Notes (Signed)
eLink Physician-Brief Progress Note Patient Name: Anthony Nelson DOB: [birth year] MRN: [id number]   Date of Service  03/09/2020  HPI/Events of Note  Notified of PCO2 97 PH 7.35 on BiPap, hemodynamically stable  eICU Interventions  Acute on chronic hypercapneic respiratory failure, compensated     Intervention Category Major Interventions: Respiratory failure - evaluation and management  Judd Lien 03/09/2020, 6:29 AM

## 2020-03-09 NOTE — Progress Notes (Signed)
eLink Physician-Brief Progress Note Patient Name: Anthony Nelson DOB: 09-Apr-1961 MRN: [id number]   Date of Service  03/09/2020  HPI/Events of Note  Notified of agitation, kicking nurses Im unable to camera in for this patient in the ED Noted use of nicotine  eICU Interventions  Ordered ativan 0.5 to 1 mg IV Ordered nicotine patch     Intervention Category Minor Interventions: Agitation / anxiety - evaluation and management  Judd Lien 03/09/2020, 3:41 AM

## 2020-03-09 NOTE — ED Notes (Signed)
X-ray to room, pt in fetal position, RN and EMT Jesus and [place] and [facility] tried to assist pt in moving into the correct position, pt is not cooperating, RN attempted to walk pt up, pt resumed same fetal position. X-ray could not be completed at this time due to pt being uncooperative. Will have Network engineer page MD

## 2020-03-09 NOTE — ED Notes (Signed)
Pt found sitting on the end of his bed, agitated and confused.  He had removed his bipap.  He states "I'm going to fight... give me those pills." There were no pills on the table.  Provider paged.  Nurses were able to convince him to wear the bipap.

## 2020-03-10 LAB — CBC
HCT: 42.6 % (ref 39.0–52.0)
Hemoglobin: 13.1 g/dL (ref 13.0–17.0)
MCH: 32.4 pg (ref 26.0–34.0)
MCHC: 30.8 g/dL (ref 30.0–36.0)
MCV: 105.4 fL — ABNORMAL HIGH (ref 80.0–100.0)
Platelets: 130 10*3/uL — ABNORMAL LOW (ref 150–400)
RBC: 4.04 MIL/uL — ABNORMAL LOW (ref 4.22–5.81)
RDW: 13.3 % (ref 11.5–15.5)
WBC: 5.1 10*3/uL (ref 4.0–10.5)
nRBC: 0 % (ref 0.0–0.2)

## 2020-03-10 LAB — GLUCOSE, CAPILLARY
Glucose-Capillary: 113 mg/dL — ABNORMAL HIGH (ref 70–99)
Glucose-Capillary: 113 mg/dL — ABNORMAL HIGH (ref 70–99)
Glucose-Capillary: 119 mg/dL — ABNORMAL HIGH (ref 70–99)
Glucose-Capillary: 147 mg/dL — ABNORMAL HIGH (ref 70–99)
Glucose-Capillary: 181 mg/dL — ABNORMAL HIGH (ref 70–99)
Glucose-Capillary: 78 mg/dL (ref 70–99)
Glucose-Capillary: 99 mg/dL (ref 70–99)

## 2020-03-10 LAB — BASIC METABOLIC PANEL
Anion gap: 5 (ref 5–15)
BUN: 13 mg/dL (ref 6–20)
CO2: 43 mmol/L — ABNORMAL HIGH (ref 22–32)
Calcium: 8.5 mg/dL — ABNORMAL LOW (ref 8.9–10.3)
Chloride: 89 mmol/L — ABNORMAL LOW (ref 98–111)
Creatinine, Ser: 0.72 mg/dL (ref 0.61–1.24)
GFR calc Af Amer: >60 mL/min (ref >60)
GFR calc non Af Amer: >60 mL/min (ref >60)
Glucose, Bld: 100 mg/dL — ABNORMAL HIGH (ref 70–99)
Potassium: 3.6 mmol/L (ref 3.5–5.1)
Sodium: 137 mmol/L (ref 135–145)

## 2020-03-10 LAB — PHOSPHORUS: Phosphorus: 2.6 mg/dL (ref 2.5–4.6)

## 2020-03-10 LAB — MAGNESIUM: Magnesium: 2 mg/dL (ref 1.7–2.4)

## 2020-03-10 NOTE — Progress Notes (Addendum)
PROGRESS NOTE    Anthony Nelson  FIE:[id number] DOB: [birth year] DOA: 03/08/2020 PCP: [name], MD    Brief Narrative:  This  59 years old male with history of ongoing tobacco abuse and COPD on 2L/ m home oxygen at baseline,  he was found to have altered mental status at home.  EMS found his oxygen saturation at 45% on room air , he was initially placed on nonrebreather with adequate sats then again he decompensated in the ED requiring BiPAP.  Patient was admitted initially in the ICU. He required few hours of BiPAP therapy.  His PCO2 has improved on ABG.  Patient is back to nasal cannula on 3 L saturating 100%.  PCCM pick up 9/9.  He is admitted for acute on chronic hypoxic and hypercapnic respiratory failure requiring BiPAP.  He is on nasal cannula 3L/ m now saturating 94%.  His PCO2 is  still elevated but better than before.  Patient is more alert and awake.   Assessment & Plan:   Principal Problem:   Acute on chronic respiratory failure with hypercapnia (HCC) Active Problems:   Cigarette smoker   COPD GOLD IV still smoking   Cor pulmonale (chronic) (HCC)   Chronic diastolic heart failure (HCC)   Chronic respiratory failure with hypoxia (HCC)   Acute on Chronic hypoxic/hypercapnic respiratory Failure sec. to Acute COPD exacerbation / PAH Initially admitted in the ICU. He was found to have PCO2 120 on initial ABG. Patient's pH has improved but continue to have hypercapnia with PCO2 97, much improved from more than 120 He is off BIPAP, Continue BIPAP at night or as needed. Saturation goals are > 88% Wean Oxygen as able Continue nebs Q6hr  and Solu-Medrol 40 mg daily  Acute metabolic encephalopathy, due to hypercarbia >>> Improved. Patient was agitated and restless 9/7 Continue Haldol PRN. He is more alert, oriented,   Hyperkalemia >>>Improved Monitor closely. Recheck labs.  Acute Kidney Injury >>>>improved Monitor serum creatinine. Avoid nephrotoxic medications  Acute  on chronic respiratory acidosis with severe metabolic alkalosis He was given saline bolus. Continue acetazolamide  Recurrent hypoglycemia Patient became hypoglycemic 9/7, sliding scale insulin held. Monitor capillary glucose Resume diet once off BiPAP   DVT prophylaxis: heparin sq Code Status: Full Family Communication:  No one at bed side, D/w Patient Disposition Plan: Status is: Inpatient  Remains inpatient appropriate because:Inpatient level of care appropriate due to severity of illness   Dispo: The patient is from: Home              Anticipated d/c is to: Home              Anticipated d/c date is: 2 days              Patient currently is not medically stable to d/c.   Consultants:   PCCM  Procedures: Bipap , Echo    Antimicrobials: Anti-infectives (From admission, onward)   None     Subjective: Patient was seen and examined at bedside.  Overnight events noted.   Patient was on 3 L supplemental oxygen saturating 100%.   Patient is alert,  awake oriented, asks when he can be discharged home.  Objective: Vitals:   03/10/20 1100 03/10/20 1200 03/10/20 1300 03/10/20 1350  BP: 96/72 (!) 82/54 (!) 94/58   Pulse: 75 75 74 84  Resp: _0 Temp:      TempSrc:      SpO2: 99% 99% 95%   Weight:  Intake/Output Summary (Last 24 hours) at 03/10/2020 1626 Last data filed at 03/10/2020 0944 Gross per 24 hour  Intake 60 ml  Output 725 ml  Net -665 ml   Filed Weights   03/10/20 0429  Weight: 54.2 kg    Examination:  General exam: Appears calm and comfortable , Alert, oriented x 3  Respiratory system: Clear to auscultation. Respiratory effort normal. Cardiovascular system: S1 & S2 heard, RRR. No JVD, murmurs, rubs, gallops or clicks. No pedal edema. Gastrointestinal system: Abdomen is nondistended, soft and nontender. No organomegaly or masses felt. Normal bowel sounds heard. Central nervous system: Alert and oriented. No focal neurological  deficits. Extremities:  No edema, no cyanosis, no clubbing. Skin: No rashes, lesions or ulcers Psychiatry: Judgement and insight appear normal. Mood & affect appropriate.     Data Reviewed: I have personally reviewed following labs and imaging studies  CBC: Recent Labs  Lab 03/08/20 1141 03/08/20 1615 03/09/20 0435 03/10/20 0955  WBC 6.4 7.6 7.1 5.1  NEUTROABS 5.2  --   --   --   HGB 14.5 15.0 13.3 13.1  HCT 48.1 49.9 43.5 42.6  MCV 108.1* 108.5* 106.1* 105.4*  PLT 146* 132* 147* 177*   Basic Metabolic Panel: Recent Labs  Lab 03/08/20 1141 03/08/20 1615 03/09/20 0435 03/10/20 0955  NA 132* 133* 136 137  K 6.2* 5.1 4.1 3.6  CL 80* 76* 79* 89*  CO2 40* 44* 46* 43*  GLUCOSE 128* 89 131* 100*  BUN 28* 30* 22* 13  CREATININE 1.31* 1.08 0.74 0.72  CALCIUM 8.4* 8.6* 8.5* 8.5*  MG  --  1.7 1.5* 2.0  PHOS  --  5.6* 3.3 2.6   GFR: Estimated Creatinine Clearance: 75.3 mL/min (by C-G formula based on SCr of 0.72 mg/dL). Liver Function Tests: Recent Labs  Lab 03/08/20 1141 03/08/20 1615  AST 113* 102*  ALT 122* 134*  ALKPHOS 63 64  BILITOT 1.6* 0.9  PROT 5.5* 6.5  ALBUMIN 3.6 3.9   No results for input(s): LIPASE, AMYLASE in the last 168 hours. No results for input(s): AMMONIA in the last 168 hours. Coagulation Profile: No results for input(s): INR, PROTIME in the last 168 hours. Cardiac Enzymes: No results for input(s): CKTOTAL, CKMB, CKMBINDEX, TROPONINI in the last 168 hours. BNP (last 3 results) No results for input(s): PROBNP in the last 8760 hours. HbA1C: Recent Labs    03/08/20 1900  HGBA1C 5.9*   CBG: Recent Labs  Lab 03/09/20 2030 03/10/20 0030 03/10/20 0427 03/10/20 0751 03/10/20 1105  GLUCAP 169* 113* 119* 99 78   Lipid Profile: No results for input(s): CHOL, HDL, LDLCALC, TRIG, CHOLHDL, LDLDIRECT in the last 72 hours. Thyroid Function Tests: No results for input(s): TSH, T4TOTAL, FREET4, T3FREE, THYROIDAB in the last 72 hours. Anemia  Panel: No results for input(s): VITAMINB12, FOLATE, FERRITIN, TIBC, IRON, RETICCTPCT in the last 72 hours. Sepsis Labs: No results for input(s): PROCALCITON, LATICACIDVEN in the last 168 hours.  Recent Results (from the past 240 hour(s))  SARS Coronavirus 2 by RT PCR (hospital order, performed in [facility] hospital lab) Nasopharyngeal Nasopharyngeal Swab     Status: None   Collection Time: 03/08/20 11:41 AM   Specimen: Nasopharyngeal Swab  Result Value Ref Range Status   SARS Coronavirus 2 NEGATIVE NEGATIVE Final    Comment: (NOTE) SARS-CoV-2 target nucleic acids are NOT DETECTED.  The SARS-CoV-2 RNA is generally detectable in upper and lower respiratory specimens during the acute phase of infection. The lowest concentration of  SARS-CoV-2 viral copies this assay can detect is 250 copies / mL. A negative result does not preclude SARS-CoV-2 infection and should not be used as the sole basis for treatment or other patient management decisions.  A negative result may occur with improper specimen collection / handling, submission of specimen other than nasopharyngeal swab, presence of viral mutation(s) within the areas targeted by this assay, and inadequate number of viral copies (<250 copies / mL). A negative result must be combined with clinical observations, patient history, and epidemiological information.  Fact Sheet for Patients:   StrictlyIdeas.no  Fact Sheet for Healthcare Providers: BankingDealers.co.za  This test is not yet approved or  cleared by the [place] FDA and has been authorized for detection and/or diagnosis of SARS-CoV-2 by FDA under an Emergency Use Authorization (EUA).  This EUA will remain in effect (meaning this test can be used) for the duration of the COVID-19 declaration under Section 564(b)(1) of the Act, 21 U.S.C. section 360bbb-3(b)(1), unless the authorization is terminated or revoked  sooner.  Performed at [facility], [place] [address], [place], [place] [address]      Radiology Studies: DG Chest [place] 1 View  Result Date: 03/09/2020 CLINICAL DATA:  59 year old male with acute on chronic respiratory failure. Shortness of breath. EXAM: PORTABLE CHEST 1 VIEW COMPARISON:  Portable chest 03/08/2020 and earlier. FINDINGS: Portable AP semi upright view at 0725 hours. Chronic pulmonary hyperinflation with stable cardiac and mediastinal contours. Heart size remains within normal limits. No pneumothorax, pulmonary edema, pleural effusion or acute pulmonary opacity. No acute osseous abnormality identified. Negative visible bowel gas pattern. IMPRESSION: Chronic pulmonary hyperinflation. No acute cardiopulmonary abnormality. Electronically Signed   By: Genevie Ann M.D.   On: 03/09/2020 07:36   ECHOCARDIOGRAM COMPLETE  Result Date: 03/09/2020    ECHOCARDIOGRAM REPORT   Patient Name:   RANDEEP BIONDOLILLO Date of Exam: 03/09/2020 Medical Rec #:  [id number]    Height:       71.0 in Accession #:    [id number]   Weight:       116.0 lb Date of Birth:  [birth year]     BSA:          1.673 m Patient Age:    53 years     BP:           107/76 mmHg Patient Gender: M            HR:           75 bpm. Exam Location:  Inpatient Procedure: 2D Echo and 3D Echo Indications:    CHF-Acute Diastolic 382.50 / N39.76  History:        Patient has prior history of Echocardiogram examinations, most                 recent 02/08/2018. COPD and Pulmonary HTN,                 Signs/Symptoms:Shortness of Breath and Altered mental status;                 Risk Factors:Former Smoker. Respiratory failure with hypoxia.  Sonographer:    Darlina Sicilian RDCS Referring Phys: [place] Comments: Difficult to images patient on BiPap. IMPRESSIONS  1. Severe RV failure.  2. Left ventricular ejection fraction, by estimation, is 60 to 65%. The left ventricle has normal function. The left ventricle has no regional wall motion  abnormalities. Left ventricular diastolic parameters were normal.  3. Right  ventricular systolic function is severely reduced. The right ventricular size is severely enlarged. There is moderately elevated pulmonary artery systolic pressure.  4. The mitral valve is normal in structure. Trivial mitral valve regurgitation. No evidence of mitral stenosis.  5. Tricuspid valve regurgitation is mild to moderate.  6. The aortic valve is tricuspid. Aortic valve regurgitation is not visualized. Mild aortic valve sclerosis is present, with no evidence of aortic valve stenosis.  7. The inferior vena cava is dilated in size with <50% respiratory variability, suggesting right atrial pressure of 15 mmHg. FINDINGS  Left Ventricle: Left ventricular ejection fraction, by estimation, is 60 to 65%. The left ventricle has normal function. The left ventricle has no regional wall motion abnormalities. The left ventricular internal cavity size was normal in size. There is  no left ventricular hypertrophy. Left ventricular diastolic parameters were normal. Right Ventricle: The right ventricular size is severely enlarged. Right vetricular wall thickness was not assessed. Right ventricular systolic function is severely reduced. There is moderately elevated pulmonary artery systolic pressure. The tricuspid regurgitant velocity is 2.74 m/s, and with an assumed right atrial pressure of 15 mmHg, the estimated right ventricular systolic pressure is 35.5 mmHg. Left Atrium: Left atrial size was normal in size. Right Atrium: Right atrial size was normal in size. Pericardium: There is no evidence of pericardial effusion. Mitral Valve: The mitral valve is normal in structure. Trivial mitral valve regurgitation. No evidence of mitral valve stenosis. Tricuspid Valve: The tricuspid valve is normal in structure. Tricuspid valve regurgitation is mild to moderate. No evidence of tricuspid stenosis. Aortic Valve: The aortic valve is tricuspid. Aortic valve  regurgitation is not visualized. Mild aortic valve sclerosis is present, with no evidence of aortic valve stenosis. Pulmonic Valve: The pulmonic valve was normal in structure. Pulmonic valve regurgitation is not visualized. No evidence of pulmonic stenosis. Aorta: The aortic root is normal in size and structure. Venous: The inferior vena cava is dilated in size with less than 50% respiratory variability, suggesting right atrial pressure of 15 mmHg. IAS/Shunts: No atrial level shunt detected by color flow Doppler. Additional Comments: Severe RV failure.  LEFT VENTRICLE PLAX 2D LVIDd:         4.30 cm  Diastology LVIDs:         2.60 cm  LV e' medial:    5.98 cm/s LV PW:         0.90 cm  LV E/e' medial:  5.1 LV IVS:        0.80 cm  LV e' lateral:   5.66 cm/s LVOT diam:     2.00 cm  LV E/e' lateral: 5.4 LVOT Area:     3.14 cm  RIGHT VENTRICLE RV S prime:     10.80 cm/s TAPSE (M-mode): 1.6 cm LEFT ATRIUM           Index LA diam:      3.30 cm 1.97 cm/m LA Vol (A4C): 22.3 ml 13.33 ml/m   AORTA Ao Root diam: 3.30 cm MITRAL VALVE               TRICUSPID VALVE MV Area (PHT): 3.08 cm    TR Peak grad:   30.0 mmHg MV Decel Time: 246 msec    TR Vmax:        274.00 cm/s MV E velocity: 30.60 cm/s MV A velocity: 42.10 cm/s  SHUNTS MV E/A ratio:  0.73        Systemic Diam: 2.00 cm Jenkins Rouge MD Electronically signed  by Jenkins Rouge MD Signature Date/Time: 03/09/2020/3:33:12 PM    Final      Scheduled Meds: . acetaZOLAMIDE  500 mg Oral Q12H  . heparin  5,000 Units Subcutaneous Q8H  . ipratropium-albuterol  3 mL Nebulization Q6H  . methylPREDNISolone (SOLU-MEDROL) injection  40 mg Intravenous q1800  . nicotine  21 mg Transdermal Daily   Continuous Infusions: . dextrose 5 % and 0.9% NaCl 75 mL/hr at 03/10/20 0206  . famotidine (PEPCID) IV 20 mg (03/10/20 0944)     LOS: 2 days    Time spent: 25 mins.    Shawna Clamp, MD [facility]   If 7PM-7AM, please contact night-coverage

## 2020-03-10 NOTE — Progress Notes (Signed)
Pt is off the BIPAP and resting well. He would wait to wear it. RT will continue to monitor. BIPAP SB.

## 2020-03-10 NOTE — Progress Notes (Signed)
°   03/10/20 0753  Assess: MEWS Score  Temp 98.3 F (36.8 C)  BP 94/67  Pulse Rate 66  ECG Heart Rate 67  Resp 14  Level of Consciousness Responds to Voice  SpO2 97 %  O2 Device Bi-PAP  Assess: if the MEWS score is Yellow or Red  Were vital signs taken at a resting state? Yes  Focused Assessment No change from prior assessment  Early Detection of Sepsis Score *See Row Information* Low  MEWS guidelines implemented *See Row Information* Yes  Treat  MEWS Interventions Escalated (See documentation below)  Take Vital Signs  Increase Vital Sign Frequency  Yellow: Q 2hr X 2 then Q 4hr X 2, if remains yellow, continue Q 4hrs  Escalate  MEWS: Escalate Yellow: discuss with charge nurse/RN and consider discussing with provider and RRT  Notify: Charge Nurse/RN  Name of Charge Nurse/RN Notified Abeni Finchum  Date Charge Nurse/RN Notified 03/10/20  Time Charge Nurse/RN Notified 0758  Document  Patient Outcome Stabilized after interventions  Progress note created (see row info) Yes

## 2020-03-11 ENCOUNTER — Encounter (HOSPITAL_COMMUNITY): Payer: Self-pay | Admitting: Internal Medicine

## 2020-03-11 LAB — CBC
HCT: 39.6 % (ref 39.0–52.0)
Hemoglobin: 12.5 g/dL — ABNORMAL LOW (ref 13.0–17.0)
MCH: 33.2 pg (ref 26.0–34.0)
MCHC: 31.6 g/dL (ref 30.0–36.0)
MCV: 105 fL — ABNORMAL HIGH (ref 80.0–100.0)
Platelets: 126 10*3/uL — ABNORMAL LOW (ref 150–400)
RBC: 3.77 MIL/uL — ABNORMAL LOW (ref 4.22–5.81)
RDW: 13.2 % (ref 11.5–15.5)
WBC: 5.1 10*3/uL (ref 4.0–10.5)
nRBC: 0 % (ref 0.0–0.2)

## 2020-03-11 LAB — GLUCOSE, CAPILLARY
Glucose-Capillary: 162 mg/dL — ABNORMAL HIGH (ref 70–99)
Glucose-Capillary: 106 mg/dL — ABNORMAL HIGH (ref 70–99)
Glucose-Capillary: 91 mg/dL (ref 70–99)
Glucose-Capillary: 129 mg/dL — ABNORMAL HIGH (ref 70–99)
Glucose-Capillary: 138 mg/dL — ABNORMAL HIGH (ref 70–99)

## 2020-03-11 LAB — MAGNESIUM: Magnesium: 1.7 mg/dL (ref 1.7–2.4)

## 2020-03-11 LAB — PHOSPHORUS: Phosphorus: 2.8 mg/dL (ref 2.5–4.6)

## 2020-03-11 LAB — BASIC METABOLIC PANEL
Anion gap: 4 — ABNORMAL LOW (ref 5–15)
BUN: 11 mg/dL (ref 6–20)
CO2: 38 mmol/L — ABNORMAL HIGH (ref 22–32)
Calcium: 8.3 mg/dL — ABNORMAL LOW (ref 8.9–10.3)
Chloride: 95 mmol/L — ABNORMAL LOW (ref 98–111)
Creatinine, Ser: 0.68 mg/dL (ref 0.61–1.24)
GFR calc Af Amer: >60 mL/min (ref >60)
GFR calc non Af Amer: >60 mL/min (ref >60)
Glucose, Bld: 185 mg/dL — ABNORMAL HIGH (ref 70–99)
Potassium: 4 mmol/L (ref 3.5–5.1)
Sodium: 137 mmol/L (ref 135–145)

## 2020-03-11 MED ORDER — ENSURE ENLIVE PO LIQD
237.0000 mL | Freq: Two times a day (BID) | ORAL | Status: DC
  Administered 2020-03-11 – 2020-03-15 (×9): 237 mL via ORAL

## 2020-03-11 MED ORDER — FAMOTIDINE 20 MG PO TABS: 20.0000 mg | ORAL_TABLET | Freq: Two times a day (BID) | ORAL | Status: DC

## 2020-03-11 MED ORDER — FAMOTIDINE 20 MG PO TABS
20.0000 mg | ORAL_TABLET | Freq: Two times a day (BID) | ORAL | Status: DC
  Administered 2020-03-11 – 2020-03-15 (×7): 20 mg via ORAL
  Filled 2020-03-11 (×7): qty 1

## 2020-03-11 MED ORDER — IPRATROPIUM-ALBUTEROL 0.5-2.5 (3) MG/3ML IN SOLN
3.0000 mL | Freq: Three times a day (TID) | RESPIRATORY_TRACT | Status: DC
  Administered 2020-03-12 – 2020-03-14 (×5): 3 mL via RESPIRATORY_TRACT
  Filled 2020-03-11 (×5): qty 3

## 2020-03-11 NOTE — Evaluation (Signed)
Physical Therapy Evaluation Patient Details Name: Anthony Nelson MRN: [id number] DOB: October 17, 1960 Today's Date: 03/11/2020   History of Present Illness  59 year old male with COPD who was brought into the emergency department with complaint of shortness of breath was found to be hypoxemic with O2 sat 45% on room air, initially was placed on nonrebreather mask. His ABG revealed pH of 7.1 with PCO2 over 120, he was placed on BiPAP. Admitted 03/08/20 for treatment of acute hypoxic/hypercapnic respiratory failure and Acuted COPD exacerbation.   Clinical Impression  PTA pt living with wife in single story home with 3 steps to enter. Pt reports independence in mobility, ADLs and iADLs, although reports decreased endurance especially with ambulation and bathing. Pt is limited in safe mobility by oxygen desaturation (see General Comments) and generalized weakness. Pt is independent in bed mobility and transfers and min guard for ambulation of 40 feet without AD. Pt reports he knows that he needs to stop smoking and eat. Pt reports increase constipation reducing his desire to eat. Pt educated on purse lip breathing for oxygen recovery, incentive spirometer usage, energy conservation and need for protein consumption. Pt has poor understanding of his COPD diagnosis. PT recommending Rollator and HHPT for energy conservation. PT will continue to follow acutely.     Follow Up Recommendations Home health PT;Supervision for mobility/OOB    Equipment Recommendations  Other (comment) (Rollator)    Recommendations for Other Services       Precautions / Restrictions Precautions Precautions: None Restrictions Weight Bearing Restrictions: No      Mobility  Bed Mobility Overal bed mobility: Independent                Transfers Overall transfer level: Independent                  Ambulation/Gait Ambulation/Gait assistance: Min guard Gait Distance (Feet): 20 Feet (2x20 feet ) Assistive device:  None Gait Pattern/deviations: Step-through pattern;Decreased step length - right;Decreased step length - left;Shuffle Gait velocity: slowed   General Gait Details: min guard for safety and line management pt limited by oxygen desaturation      Balance Overall balance assessment: Mild deficits observed, not formally tested                                           Pertinent Vitals/Pain Pain Assessment: No/denies pain    Home Living Family/patient expects to be discharged to:: Private residence Living Arrangements: Spouse/significant other Available Help at Discharge: Family;Available 24 hours/day Type of Home: House Home Access: Stairs to enter Entrance Stairs-Rails: None Entrance Stairs-Number of Steps: 3 Home Layout: One level Home Equipment: Hand held shower head      Prior Function Level of Independence: Independent         Comments: independent but reports difficulty with endurance during bathing and longer distance ambulation      Hand Dominance        Extremity/Trunk Assessment   Upper Extremity Assessment Upper Extremity Assessment: Defer to OT evaluation    Lower Extremity Assessment Lower Extremity Assessment: Overall WFL for tasks assessed       Communication   Communication: No difficulties  Cognition Arousal/Alertness: Awake/alert Behavior During Therapy: WFL for tasks assessed/performed Overall Cognitive Status: Within Functional Limits for tasks assessed  General Comments: poor understanding of COPD       General Comments General comments (skin integrity, edema, etc.): Pt on 2L supplemental O2 at home, currently on 3 L O2 via [place] at rest and able to maintain SaO2 >90%O2, with standing SaO2 dropped to 77%O2, with purse lip breathing able to recover to 85%O2, increased to 4L O2 via , able to recover to 94%O2, with ambulation SaO2 dropped to 90%O2 with 20 feet ambulation dropped to  85%O2 with 40 feet ambulation, pt sat and able to recover to 90%O2, with continued sitting able to reduce to 3L O2, and SaO2 >92%O2, at rest HR 84bpm, max HR with ambulation 118 bpm, BP 96/69     Exercises Other Exercises Other Exercises: IS x10 with max inhalation 1000 mL   Assessment/Plan    PT Assessment Patient needs continued PT services  PT Problem List Cardiopulmonary status limiting activity;Decreased activity tolerance;Decreased strength       PT Treatment Interventions DME instruction;Gait training;Stair training;Functional mobility training;Therapeutic activities;Therapeutic exercise;Balance training;Cognitive remediation;Patient/family education    PT Goals (Current goals can be found in the Care Plan section)  Acute Rehab PT Goals Patient Stated Goal: go home PT Goal Formulation: With patient Time For Goal Achievement: 03/25/20 Potential to Achieve Goals: Good    Frequency Min 3X/week    AM-PAC PT "6 Clicks" Mobility  Outcome Measure Help needed turning from your back to your side while in a flat bed without using bedrails?: None Help needed moving from lying on your back to sitting on the side of a flat bed without using bedrails?: None Help needed moving to and from a bed to a chair (including a wheelchair)?: None Help needed standing up from a chair using your arms (e.g., wheelchair or bedside chair)?: None Help needed to walk in hospital room?: A Little Help needed climbing 3-5 steps with a railing? : A Little 6 Click Score: 22    End of Session Equipment Utilized During Treatment: Gait belt;Oxygen Activity Tolerance: Treatment limited secondary to medical complications (Comment) (oxygen desaturation ) Patient left: in chair;with call bell/phone within reach;with chair alarm set Nurse Communication: Mobility status;Other (comment) (c/o constipation ) PT Visit Diagnosis: Difficulty in walking, not elsewhere classified (R26.2)    Time: 1009-1100 PT Time  Calculation (min) (ACUTE ONLY): 51 min   Charges:   PT Evaluation $PT Eval Moderate Complexity: 1 Mod PT Treatments $Gait Training: 8-22 mins $Self Care/Home Management: 8-22        Anthony Nelson B. Migdalia Dk PT, DPT Acute Rehabilitation Services Pager [phone] Office [phone]   [place] 03/11/2020, 11:21 AM

## 2020-03-11 NOTE — Progress Notes (Signed)
PROGRESS NOTE    Dace Denn  MOL:[id number] DOB: 01/22/1961 DOA: 03/08/2020 PCP: [name], MD    Brief Narrative:  This  59 years old male with history of ongoing tobacco abuse and COPD on 2L/ m home oxygen at baseline,  he was found to have altered mental status at home.  EMS found his oxygen saturation at 45% on room air , he was initially placed on nonrebreather with adequate sats then again he decompensated in the ED requiring BiPAP.  Patient was admitted initially in the ICU. He required few hours of BiPAP therapy.  His PCO2 has improved on ABG.  Patient is back to nasal cannula on 3 L saturating 100%.  PCCM pick up 9/9.  He is admitted for acute on chronic hypoxic and hypercapnic respiratory failure requiring BiPAP.  He is on nasal cannula 3L/ m now saturating 94%.  His PCO2 is still elevated but better than before.  Patient is more alert and awake.   Assessment & Plan:   Principal Problem:   Acute on chronic respiratory failure with hypercapnia (HCC) Active Problems:   Cigarette smoker   COPD GOLD IV still smoking   Cor pulmonale (chronic) (HCC)   Chronic diastolic heart failure (HCC)   Chronic respiratory failure with hypoxia (HCC)   Acute on Chronic hypoxic/hypercapnic respiratory Failure sec. to Acute COPD exacerbation / PAH Initially admitted in the ICU. He was found to have PCO2 120 on initial ABG. Patient's pH has improved but continue to have hypercapnia with PCO2 97, much improved from more than 120 He is off BIPAP, Continue BIPAP at night or as needed. Saturation goals are > 88% Wean Oxygen as able Continue nebs Q6hr  and Solu-Medrol 40 mg daily Will transition to prednisone at discharge.  Acute metabolic encephalopathy, due to hypercarbia >>> Improved. Patient was agitated and restless 9/7 Continue Haldol PRN. He is more alert, oriented,cooperative   Hyperkalemia >>>Improved Monitor closely. Recheck labs.  Acute Kidney Injury >>>>improved Monitor  serum creatinine. Avoid nephrotoxic medications  Acute on chronic respiratory acidosis with severe metabolic alkalosis - Improved He was given saline bolus. Continue acetazolamide  Recurrent hypoglycemia Patient became hypoglycemic 9/7, sliding scale insulin held. Monitor capillary glucose Resume diet once off BiPAP   DVT prophylaxis: heparin sq Code Status: Full Family Communication:  No one at bed side, D/w Patient Disposition Plan: Status is: Inpatient  Remains inpatient appropriate because:Inpatient level of care appropriate due to severity of illness   Dispo: The patient is from: Home              Anticipated d/c is to: Home              Anticipated d/c date is: 2 days              Patient currently is not medically stable to d/c.   Consultants:   PCCM  Procedures: Bipap , Echo    Antimicrobials: Anti-infectives (From admission, onward)   None     Subjective: Patient was seen and examined at bedside.  Overnight events noted.   Patient was on 3 L supplemental oxygen while ambulation during physical therapy,  his O2 saturation has dropped.  Oxygen increased to 5 L.  Will wean as tolerated.  Objective: Vitals:   03/11/20 0806 03/11/20 1110 03/11/20 1449 03/11/20 1706  BP: 96/65 100/62  100/66  Pulse: 84 89  94  Resp: _0 Temp: 98.1 F (36.7 C) 98.1 F (36.7 C)  98  F (36.7 C)  TempSrc: Oral Oral  Oral  SpO2: 93% 96%  92%  Weight:      Height:   _0  (1.854 m)     Intake/Output Summary (Last 24 hours) at 03/11/2020 1737 Last data filed at 03/11/2020 1700 Gross per 24 hour  Intake 3457.82 ml  Output 3900 ml  Net -442.18 ml   Filed Weights   03/10/20 0429 03/11/20 0404  Weight: 54.2 kg 56.9 kg    Examination:  General exam: Appears calm and comfortable , Alert, oriented x 3  Respiratory system: Clear to auscultation. Respiratory effort normal. Cardiovascular system: S1 & S2 heard, RRR. No JVD, murmurs, rubs, gallops or clicks. No  pedal edema. Gastrointestinal system: Abdomen is nondistended, soft and nontender. No organomegaly or masses felt. Normal bowel sounds heard. Central nervous system: Alert and oriented. No focal neurological deficits. Extremities:  No edema, no cyanosis, no clubbing. Skin: No rashes, lesions or ulcers Psychiatry: Judgement and insight appear normal. Mood & affect appropriate.     Data Reviewed: I have personally reviewed following labs and imaging studies  CBC: Recent Labs  Lab 03/08/20 1141 03/08/20 1615 03/09/20 0435 03/10/20 0955 03/11/20 0354  WBC 6.4 7.6 7.1 5.1 5.1  NEUTROABS 5.2  --   --   --   --   HGB 14.5 15.0 13.3 13.1 12.5*  HCT 48.1 49.9 43.5 42.6 39.6  MCV 108.1* 108.5* 106.1* 105.4* 105.0*  PLT 146* 132* 147* 130* 161*   Basic Metabolic Panel: Recent Labs  Lab 03/08/20 1141 03/08/20 1615 03/09/20 0435 03/10/20 0955 03/11/20 0354  NA 132* 133* 136 137 137  K 6.2* 5.1 4.1 3.6 4.0  CL 80* 76* 79* 89* 95*  CO2 40* 44* 46* 43* 38*  GLUCOSE 128* 89 131* 100* 185*  BUN 28* 30* 22* 13 11  CREATININE 1.31* 1.08 0.74 0.72 0.68  CALCIUM 8.4* 8.6* 8.5* 8.5* 8.3*  MG  --  1.7 1.5* 2.0 1.7  PHOS  --  5.6* 3.3 2.6 2.8   GFR: Estimated Creatinine Clearance: 79 mL/min (by C-G formula based on SCr of 0.68 mg/dL). Liver Function Tests: Recent Labs  Lab 03/08/20 1141 03/08/20 1615  AST 113* 102*  ALT 122* 134*  ALKPHOS 63 64  BILITOT 1.6* 0.9  PROT 5.5* 6.5  ALBUMIN 3.6 3.9   No results for input(s): LIPASE, AMYLASE in the last 168 hours. No results for input(s): AMMONIA in the last 168 hours. Coagulation Profile: No results for input(s): INR, PROTIME in the last 168 hours. Cardiac Enzymes: No results for input(s): CKTOTAL, CKMB, CKMBINDEX, TROPONINI in the last 168 hours. BNP (last 3 results) No results for input(s): PROBNP in the last 8760 hours. HbA1C: Recent Labs    03/08/20 1900  HGBA1C 5.9*   CBG: Recent Labs  Lab 03/10/20 2116  03/10/20 2353 03/11/20 0402 03/11/20 0803 03/11/20 1108  GLUCAP 147* 181* 162* 106* 91   Lipid Profile: No results for input(s): CHOL, HDL, LDLCALC, TRIG, CHOLHDL, LDLDIRECT in the last 72 hours. Thyroid Function Tests: No results for input(s): TSH, T4TOTAL, FREET4, T3FREE, THYROIDAB in the last 72 hours. Anemia Panel: No results for input(s): VITAMINB12, FOLATE, FERRITIN, TIBC, IRON, RETICCTPCT in the last 72 hours. Sepsis Labs: No results for input(s): PROCALCITON, LATICACIDVEN in the last 168 hours.  Recent Results (from the past 240 hour(s))  SARS Coronavirus 2 by RT PCR (hospital order, performed in [facility] hospital lab) Nasopharyngeal Nasopharyngeal Swab     Status: None  Collection Time: 03/08/20 11:41 AM   Specimen: Nasopharyngeal Swab  Result Value Ref Range Status   SARS Coronavirus 2 NEGATIVE NEGATIVE Final    Comment: (NOTE) SARS-CoV-2 target nucleic acids are NOT DETECTED.  The SARS-CoV-2 RNA is generally detectable in upper and lower respiratory specimens during the acute phase of infection. The lowest concentration of SARS-CoV-2 viral copies this assay can detect is 250 copies / mL. A negative result does not preclude SARS-CoV-2 infection and should not be used as the sole basis for treatment or other patient management decisions.  A negative result may occur with improper specimen collection / handling, submission of specimen other than nasopharyngeal swab, presence of viral mutation(s) within the areas targeted by this assay, and inadequate number of viral copies (<250 copies / mL). A negative result must be combined with clinical observations, patient history, and epidemiological information.  Fact Sheet for Patients:   StrictlyIdeas.no  Fact Sheet for Healthcare Providers: BankingDealers.co.za  This test is not yet approved or  cleared by the [place] FDA and has been authorized for detection and/or  diagnosis of SARS-CoV-2 by FDA under an Emergency Use Authorization (EUA).  This EUA will remain in effect (meaning this test can be used) for the duration of the COVID-19 declaration under Section 564(b)(1) of the Act, 21 U.S.C. section 360bbb-3(b)(1), unless the authorization is terminated or revoked sooner.  Performed at [facility], [place] [address], [place], [place] [address]      Radiology Studies: No results found.   Scheduled Meds: . acetaZOLAMIDE  500 mg Oral Q12H  . famotidine  20 mg Oral BID  . feeding supplement (ENSURE ENLIVE)  237 mL Oral BID BM  . heparin  5,000 Units Subcutaneous Q8H  . ipratropium-albuterol  3 mL Nebulization Q6H  . methylPREDNISolone (SOLU-MEDROL) injection  40 mg Intravenous q1800  . nicotine  21 mg Transdermal Daily   Continuous Infusions: . dextrose 5 % and 0.9% NaCl 75 mL/hr at 03/10/20 2134     LOS: 3 days    Time spent: 25 mins.    Shawna Clamp, MD [facility]   If 7PM-7AM, please contact night-coverage

## 2020-03-11 NOTE — Progress Notes (Signed)
Initial Nutrition Assessment  DOCUMENTATION CODES:   Underweight  INTERVENTION:    Ensure Enlive po BID, each supplement provides 350 kcal and 20 grams of protein  NUTRITION DIAGNOSIS:   Increased nutrient needs related to chronic illness (CHF, COPD, underweight with BMI = 16.6) as evidenced by estimated needs.  GOAL:   Patient will meet greater than or equal to 90% of their needs  MONITOR:   PO intake, Supplement acceptance, Labs  REASON FOR ASSESSMENT:   Malnutrition Screening Tool    ASSESSMENT:   59 yo male admitted with AMS, acute on chronic respiratory failure. PMH includes COPD, ongoing tobacco abuse, on 2 L home oxygen, pulmonary hypotension, GERD, HF.   Patient required BiPAP on admission, currently on 2 L via [place].   Patient is underweight with BMI = 16.55 Meal intake is good, he ate 100% of breakfast today. Suspect underweight status is related to increased energy expenditure with COPD and CHF and inadequate intake. Patient is likely malnourished, unable to obtain enough information at this time for identification of malnutrition.   Labs reviewed. CBG: [id number]  Medications reviewed and include solu-medrol.  52.6 kg 4 months ago. Currently 56.9 kg.  NUTRITION - FOCUSED PHYSICAL EXAM:  unable to complete  Diet Order:   Diet Order            Diet Heart Room service appropriate? Yes; Fluid consistency: Thin  Diet effective now                 EDUCATION NEEDS:   Not appropriate for education at this time  Skin:  Skin Assessment: Reviewed RN Assessment  Last BM:  9/9  Height:   Ht Readings from Last 1 Encounters:  03/11/20 _0  (1.854 m)    Weight:   Wt Readings from Last 1 Encounters:  03/11/20 56.9 kg    Ideal Body Weight:  83.6 kg  BMI:  Body mass index is 16.55 kg/m.  Estimated Nutritional Needs:   Kcal:  1900-2100  Protein:  85-100 gm  Fluid:  1.8-2 L    Lucas Mallow, RD, LDN, CNSC Please refer to Amion for  contact information.

## 2020-03-11 NOTE — Care Management (Signed)
1459 03-11-20 Case Manager spoke with patient regarding [place] recommendations from Physical Therapy (PT). Patient declined home health PT services and felt like he would fine without the services. Case Manager made Staff RN and MD aware. Patient is agreeable to the rollator and referral sent to [facility] with [employer]. Durable Medical Equipment will be delivered to the room prior to transition home. Pt has oxygen via [employer]- per wife he uses 2 Liters in the home. Patient has a PCP- MD [name] at the [facility] and [place] appointment scheduled and placed on the AVS. No further needs from Case Manager at this time. Graves-Bigelow, Ocie Cornfield, RN, BSN Case Manager

## 2020-03-11 NOTE — Evaluation (Signed)
Occupational Therapy Evaluation Patient Details Name: Anthony Nelson MRN: [id number] DOB: Aug 18, 1960 Today's Date: 03/11/2020    History of Present Illness 59 year old male with COPD who was brought into the emergency department with complaint of shortness of breath was found to be hypoxemic with O2 sat 45% on room air, initially was placed on nonrebreather mask. His ABG revealed pH of 7.1 with PCO2 over 120, he was placed on BiPAP. Admitted 03/08/20 for treatment of acute hypoxic/hypercapnic respiratory failure and Acuted COPD exacerbation.    Clinical Impression   Patient appears to be functioning at or near his baseline for toileting, functional mobility and self care.  Spouse in the room during ADL; states he is a little slow at home, but takes care of himself, and moves around the home well.  He is anticipating being discharged sometime this weekend.  No further OT needs in the acute setting, and no HH OT recommended.  RN at home to discuss COPD education may be beneficial.      Follow Up Recommendations  No OT follow up                 Precautions / Restrictions Precautions Precautions: Fall Precaution Comments: O2 sats Restrictions Weight Bearing Restrictions: No      Mobility Bed Mobility                  Transfers Overall transfer level: Modified independent               General transfer comment: did reach for objects in his environment.    Balance Overall balance assessment: Mild deficits observed, not formally tested                                         ADL either performed or assessed with clinical judgement   ADL Overall ADL's : Modified independent                                       General ADL Comments: Increased time and effort.  O2 sats monitored.     Vision Baseline Vision/History: No visual deficits Patient Visual Report: No change from baseline       Perception     Praxis      Pertinent  Vitals/Pain Pain Assessment: No/denies pain     Hand Dominance Right   Extremity/Trunk Assessment Upper Extremity Assessment Upper Extremity Assessment: Overall WFL for tasks assessed   Lower Extremity Assessment Lower Extremity Assessment: Defer to PT evaluation   Cervical / Trunk Assessment Cervical / Trunk Assessment: Normal   Communication Communication Communication: No difficulties   Cognition Arousal/Alertness: Awake/alert Behavior During Therapy: WFL for tasks assessed/performed Overall Cognitive Status: Within Functional Limits for tasks assessed                                 General Comments: May benefit from [facility] RN for COPD teaching   General Comments  Redness to B feet, patient did de-sat once to 88%, but quickley rebounded to 94%.  Patient was holding his breath while washing feet.    Exercises     Shoulder Instructions      Home Living Family/patient expects to be discharged to:: Private residence Living Arrangements: Spouse/significant other  Available Help at Discharge: Family Type of Home: House Home Access: Stairs to enter           Bathroom Shower/Tub: Tub/shower unit;Curtain   Bathroom Toilet: Standard Bathroom Accessibility: Yes How Accessible: Accessible via wheelchair Home Equipment: Hand held shower head;Shower seat          Prior Functioning/Environment Level of Independence: Independent                 OT Problem List:        OT Treatment/Interventions:      OT Goals(Current goals can be found in the care plan section) Acute Rehab OT Goals Patient Stated Goal: Stop coming to the hospital every year. OT Goal Formulation: With patient Time For Goal Achievement: 03/31/20 Potential to Achieve Goals: Fair  OT Frequency:     Barriers to D/C:            Co-evaluation              AM-PAC OT "6 Clicks" Daily Activity     Outcome Measure Help from another person eating meals?: None Help from  another person taking care of personal grooming?: None Help from another person toileting, which includes using toliet, bedpan, or urinal?: A Little Help from another person bathing (including washing, rinsing, drying)?: A Little Help from another person to put on and taking off regular upper body clothing?: A Little Help from another person to put on and taking off regular lower body clothing?: None 6 Click Score: 21   End of Session    Activity Tolerance: Patient limited by fatigue;Other (comment) (O2 sats) Patient left: in chair;with call bell/phone within reach;with chair alarm set;with family/visitor present  OT Visit Diagnosis: Other (comment) (DOE)                Time: 1400-1430 OT Time Calculation (min): 30 min Charges:  OT General Charges $OT Visit: 1 Visit OT Evaluation $OT Eval Moderate Complexity: 1 Mod OT Treatments $Self Care/Home Management : 8-22 mins  03/11/2020  Rich, OTR/L  Acute Rehabilitation Services  Office:  [place] 03/11/2020, 2:39 PM

## 2020-03-12 LAB — BLOOD GAS, ARTERIAL
Acid-Base Excess: 12.5 mmol/L — ABNORMAL HIGH (ref 0.0–2.0)
Acid-Base Excess: 10.2 mmol/L — ABNORMAL HIGH (ref 0.0–2.0)
Acid-Base Excess: 8.3 mmol/L — ABNORMAL HIGH (ref 0.0–2.0)
Bicarbonate: 41.1 mmol/L — ABNORMAL HIGH (ref 20.0–28.0)
Bicarbonate: 38.3 mmol/L — ABNORMAL HIGH (ref 20.0–28.0)
Bicarbonate: 36.3 mmol/L — ABNORMAL HIGH (ref 20.0–28.0)
Drawn by: 51702
Drawn by: 213381
Drawn by: 213381
FIO2: 27
FIO2: 40
FIO2: 40
O2 Saturation: 95.8 %
O2 Saturation: 94.7 %
O2 Saturation: 96.2 %
Patient temperature: 36.1
Patient temperature: 37
Patient temperature: 37
pCO2 arterial: 113 mmHg (ref 32.0–48.0)
pCO2 arterial: 102 mmHg (ref 32.0–48.0)
pCO2 arterial: 99.5 mmHg (ref 32.0–48.0)
pH, Arterial: 7.18 — CL (ref 7.350–7.450)
pH, Arterial: 7.198 — CL (ref 7.350–7.450)
pH, Arterial: 7.188 — CL (ref 7.350–7.450)
pO2, Arterial: 89.8 mmHg (ref 83.0–108.0)
pO2, Arterial: 76.3 mmHg — ABNORMAL LOW (ref 83.0–108.0)
pO2, Arterial: 90.6 mmHg (ref 83.0–108.0)

## 2020-03-12 LAB — GLUCOSE, CAPILLARY
Glucose-Capillary: 122 mg/dL — ABNORMAL HIGH (ref 70–99)
Glucose-Capillary: 138 mg/dL — ABNORMAL HIGH (ref 70–99)
Glucose-Capillary: 89 mg/dL (ref 70–99)
Glucose-Capillary: 115 mg/dL — ABNORMAL HIGH (ref 70–99)
Glucose-Capillary: 102 mg/dL — ABNORMAL HIGH (ref 70–99)
Glucose-Capillary: 118 mg/dL — ABNORMAL HIGH (ref 70–99)

## 2020-03-12 LAB — BASIC METABOLIC PANEL
Anion gap: 5 (ref 5–15)
BUN: 14 mg/dL (ref 6–20)
CO2: 39 mmol/L — ABNORMAL HIGH (ref 22–32)
Calcium: 9 mg/dL (ref 8.9–10.3)
Chloride: 99 mmol/L (ref 98–111)
Creatinine, Ser: 0.84 mg/dL (ref 0.61–1.24)
GFR calc Af Amer: >60 mL/min (ref >60)
GFR calc non Af Amer: >60 mL/min (ref >60)
Glucose, Bld: 121 mg/dL — ABNORMAL HIGH (ref 70–99)
Potassium: 4.3 mmol/L (ref 3.5–5.1)
Sodium: 143 mmol/L (ref 135–145)

## 2020-03-12 MED ORDER — SENNA 8.6 MG PO TABS
1.0000 | ORAL_TABLET | Freq: Once | ORAL | Status: AC
  Administered 2020-03-12: 8.6 mg via ORAL
  Filled 2020-03-12: qty 1

## 2020-03-12 NOTE — Progress Notes (Signed)
Pt was already off bipap this AM and on 2L [place], per RN pt took bipap off.  RT will continue to monitor.

## 2020-03-12 NOTE — Progress Notes (Signed)
RT called by RN to assess pt that was placed back on bipap.  PT is tolerating well.  MD wanted ABG after pt back on bipap.  RT will continue to monitor and f/u with ABG.

## 2020-03-12 NOTE — Progress Notes (Signed)
PROGRESS NOTE    Anthony Nelson  UJW:[id number] DOB: 09/03/1960 DOA: 03/08/2020 PCP: [name], MD    Brief Narrative:  This  59 years old male with history of ongoing tobacco abuse and COPD on 2L/ m home oxygen at baseline,  he was found to have altered mental status at home.  EMS found his oxygen saturation at 45% on room air , he was initially placed on nonrebreather with adequate sats then again he decompensated in the ED requiring BiPAP.  Patient was admitted initially in the ICU. He required few hours of BiPAP therapy.  His PCO2 has improved on ABG.  Patient is back to nasal cannula on 3 L saturating 100%.  PCCM pick up 9/9. He is admitted for acute on chronic hypoxic and hypercapnic respiratory failure requiring BiPAP.  He is on nasal cannula 3L/ m now saturating 94%.  His PCO2 is still elevated but better than before.  Patient refused to use BiPAP last night and in the morning ABG shows increased PCO2,  after detailed conversation patient agreed to use BiPAP.  Repeat ABG shows improving PCO2.   Assessment & Plan:   Principal Problem:   Acute on chronic respiratory failure with hypercapnia (HCC) Active Problems:   Cigarette smoker   COPD GOLD IV still smoking   Cor pulmonale (chronic) (HCC)   Chronic diastolic heart failure (HCC)   Chronic respiratory failure with hypoxia (HCC)   Acute on Chronic hypoxic/hypercapnic respiratory Failure sec. to Acute COPD exacerbation / PAH Initially admitted in the ICU. He was found to have PCO2 120 on initial ABG. Patient's pH has improved but continue to have hypercapnia with PCO2 97, much improved from more than 120 He is off BIPAP, Continue BIPAP at night or as needed. Saturation goals are > 88% Wean Oxygen as able Continue nebs Q6hr  and Solu-Medrol 40 mg daily Will transition to prednisone at discharge. Patient has been noncompliant with BiPAP use which causes PCO2 retention and altered mentation. Explained to him in detail that he needs  to use BiPAP as needed.  Acute metabolic encephalopathy, due to hypercarbia >>> Improved. Patient was agitated and restless 9/7 Continue Haldol PRN. He is more alert, oriented,cooperative   Hyperkalemia >>>Improved Monitor closely. Recheck labs.  Acute Kidney Injury >>>>improved Monitor serum creatinine. Avoid nephrotoxic medications  Acute on chronic respiratory acidosis with severe metabolic alkalosis - Improved He was given saline bolus. Continue acetazolamide  Recurrent hypoglycemia Patient became hypoglycemic 9/7, sliding scale insulin held. Monitor capillary glucose Resume diet once off BiPAP   DVT prophylaxis: heparin sq Code Status: Full Family Communication:  No one at bed side, D/w Patient Disposition Plan: Status is: Inpatient  Remains inpatient appropriate because:Inpatient level of care appropriate due to severity of illness   Dispo: The patient is from: Home              Anticipated d/c is to: Home              Anticipated d/c date is: 2 days              Patient currently is not medically stable to d/c.   Consultants:   PCCM  Procedures: Bipap , Echo    Antimicrobials: Anti-infectives (From admission, onward)   None     Subjective: Patient was seen and examined at bedside.  Overnight events noted.  He declined to use BiPAP last night, He was slightly altered in the morning,  PCO2 was found to be elevated.  Patient  was placed on BiPAP,  repeat ABG shows improving PCO2.   Objective: Vitals:   03/12/20 0830 03/12/20 0937 03/12/20 1123 03/12/20 1133  BP: 100/65 108/73  100/68  Pulse: 96 91 84 86  Resp:   (!) 22   Temp: 97.9 F (36.6 C) 98 F (36.7 C)    TempSrc: Oral Oral    SpO2: 93% 97% 99% 99%  Weight:      Height:        Intake/Output Summary (Last 24 hours) at 03/12/2020 1521 Last data filed at 03/12/2020 1200 Gross per 24 hour  Intake 1405.72 ml  Output 1550 ml  Net -144.28 ml   Filed Weights   03/10/20 0429 03/11/20  0404  Weight: 54.2 kg 56.9 kg    Examination:  General exam: Appears calm and comfortable , Alert, oriented x 3  Respiratory system: Clear to auscultation. Respiratory effort normal. Cardiovascular system: S1 & S2 heard, RRR. No JVD, murmurs, rubs, gallops or clicks. No pedal edema. Gastrointestinal system: Abdomen is nondistended, soft and nontender. No organomegaly or masses felt. Normal bowel sounds heard. Central nervous system: Alert and oriented. No focal neurological deficits. Extremities:  No edema, no cyanosis, no clubbing. Skin: No rashes, lesions or ulcers Psychiatry: Judgement and insight appear normal. Mood & affect appropriate.     Data Reviewed: I have personally reviewed following labs and imaging studies  CBC: Recent Labs  Lab 03/08/20 1141 03/08/20 1615 03/09/20 0435 03/10/20 0955 03/11/20 0354  WBC 6.4 7.6 7.1 5.1 5.1  NEUTROABS 5.2  --   --   --   --   HGB 14.5 15.0 13.3 13.1 12.5*  HCT 48.1 49.9 43.5 42.6 39.6  MCV 108.1* 108.5* 106.1* 105.4* 105.0*  PLT 146* 132* 147* 130* 849*   Basic Metabolic Panel: Recent Labs  Lab 03/08/20 1615 03/09/20 0435 03/10/20 0955 03/11/20 0354 03/12/20 0455  NA 133* 136 137 137 143  K 5.1 4.1 3.6 4.0 4.3  CL 76* 79* 89* 95* 99  CO2 44* 46* 43* 38* 39*  GLUCOSE 89 131* 100* 185* 121*  BUN 30* 22* _0 CREATININE 1.08 0.74 0.72 0.68 0.84  CALCIUM 8.6* 8.5* 8.5* 8.3* 9.0  MG 1.7 1.5* 2.0 1.7  --   PHOS 5.6* 3.3 2.6 2.8  --    GFR: Estimated Creatinine Clearance: 75.3 mL/min (by C-G formula based on SCr of 0.84 mg/dL). Liver Function Tests: Recent Labs  Lab 03/08/20 1141 03/08/20 1615  AST 113* 102*  ALT 122* 134*  ALKPHOS 63 64  BILITOT 1.6* 0.9  PROT 5.5* 6.5  ALBUMIN 3.6 3.9   No results for input(s): LIPASE, AMYLASE in the last 168 hours. No results for input(s): AMMONIA in the last 168 hours. Coagulation Profile: No results for input(s): INR, PROTIME in the last 168 hours. Cardiac  Enzymes: No results for input(s): CKTOTAL, CKMB, CKMBINDEX, TROPONINI in the last 168 hours. BNP (last 3 results) No results for input(s): PROBNP in the last 8760 hours. HbA1C: No results for input(s): HGBA1C in the last 72 hours. CBG: Recent Labs  Lab 03/11/20 2147 03/12/20 0100 03/12/20 0456 03/12/20 0749 03/12/20 1128  GLUCAP 138* 122* 138* 89 115*   Lipid Profile: No results for input(s): CHOL, HDL, LDLCALC, TRIG, CHOLHDL, LDLDIRECT in the last 72 hours. Thyroid Function Tests: No results for input(s): TSH, T4TOTAL, FREET4, T3FREE, THYROIDAB in the last 72 hours. Anemia Panel: No results for input(s): VITAMINB12, FOLATE, FERRITIN, TIBC, IRON, RETICCTPCT in the last 72 hours.  Sepsis Labs: No results for input(s): PROCALCITON, LATICACIDVEN in the last 168 hours.  Recent Results (from the past 240 hour(s))  SARS Coronavirus 2 by RT PCR (hospital order, performed in [facility] hospital lab) Nasopharyngeal Nasopharyngeal Swab     Status: None   Collection Time: 03/08/20 11:41 AM   Specimen: Nasopharyngeal Swab  Result Value Ref Range Status   SARS Coronavirus 2 NEGATIVE NEGATIVE Final    Comment: (NOTE) SARS-CoV-2 target nucleic acids are NOT DETECTED.  The SARS-CoV-2 RNA is generally detectable in upper and lower respiratory specimens during the acute phase of infection. The lowest concentration of SARS-CoV-2 viral copies this assay can detect is 250 copies / mL. A negative result does not preclude SARS-CoV-2 infection and should not be used as the sole basis for treatment or other patient management decisions.  A negative result may occur with improper specimen collection / handling, submission of specimen other than nasopharyngeal swab, presence of viral mutation(s) within the areas targeted by this assay, and inadequate number of viral copies (<250 copies / mL). A negative result must be combined with clinical observations, patient history, and epidemiological  information.  Fact Sheet for Patients:   StrictlyIdeas.no  Fact Sheet for Healthcare Providers: BankingDealers.co.za  This test is not yet approved or  cleared by the [place] FDA and has been authorized for detection and/or diagnosis of SARS-CoV-2 by FDA under an Emergency Use Authorization (EUA).  This EUA will remain in effect (meaning this test can be used) for the duration of the COVID-19 declaration under Section 564(b)(1) of the Act, 21 U.S.C. section 360bbb-3(b)(1), unless the authorization is terminated or revoked sooner.  Performed at [facility], [place] [address], [place], [place] [address]      Radiology Studies: No results found.   Scheduled Meds: . famotidine  20 mg Oral BID  . feeding supplement (ENSURE ENLIVE)  237 mL Oral BID BM  . heparin  5,000 Units Subcutaneous Q8H  . ipratropium-albuterol  3 mL Nebulization TID  . methylPREDNISolone (SOLU-MEDROL) injection  40 mg Intravenous q1800  . nicotine  21 mg Transdermal Daily   Continuous Infusions: . dextrose 5 % and 0.9% NaCl 75 mL/hr at 03/12/20 0358     LOS: 4 days    Time spent: 25 mins.    Shawna Clamp, MD [facility]   If 7PM-7AM, please contact night-coverage

## 2020-03-12 NOTE — Progress Notes (Signed)
CRITICAL VALUE ALERT  Critical Value: ABG: pH 7.18, CO2: 113, pO2 89.8  Date & Time Notied:  9/11 @ 0430  Provider Notified: Cyd Silence, MD  Orders Received/Actions taken: Pt placed on Bi-pap, awaiting further orders  Elaina Hoops, RN

## 2020-03-12 NOTE — Progress Notes (Signed)
Critical Result - Dr Dwyane Dee notified - pH = 7.188, pCO2 = 99.5

## 2020-03-12 NOTE — Significant Event (Signed)
HOSPITAL MEDICINE OVERNIGHT EVENT NOTE    Notified by nursing ABG this morning on 2 liters [place] revealing pH 7.18 pCO2 113 and pO2 89.8.  Per my discussion with RT and nursing, patient is currently at baseline mentation and not in respiratory distress.  Chart reviewed.   This ABG is similar to ABG on 9/7 which was remedied with BIPAP without mechanical ventilation.    Because of this, will try trial of BIPAP.  Patient has been placed on 22/8 with 40% FiO2.  I will order repeat ABG for 730AM to confirm improvement.  Anthony Emerald  MD [facility]

## 2020-03-12 NOTE — Progress Notes (Signed)
Pt refused BIPAP at this time. sitting comfortably in chair alert and vitals stable. Patient stated he would call if SOB. RT to cont to monitor.

## 2020-03-12 NOTE — Progress Notes (Signed)
Pt removed Bipap and refused. Nasal cannula placed at 2L/min. Will monitor.

## 2020-03-13 LAB — CBC
HCT: 39.5 % (ref 39.0–52.0)
Hemoglobin: 11.9 g/dL — ABNORMAL LOW (ref 13.0–17.0)
MCH: 32.3 pg (ref 26.0–34.0)
MCHC: 30.1 g/dL (ref 30.0–36.0)
MCV: 107.3 fL — ABNORMAL HIGH (ref 80.0–100.0)
Platelets: 118 10*3/uL — ABNORMAL LOW (ref 150–400)
RBC: 3.68 MIL/uL — ABNORMAL LOW (ref 4.22–5.81)
RDW: 13.7 % (ref 11.5–15.5)
WBC: 4.9 10*3/uL (ref 4.0–10.5)
nRBC: 0 % (ref 0.0–0.2)

## 2020-03-13 LAB — BLOOD GAS, ARTERIAL
Acid-Base Excess: 8.4 mmol/L — ABNORMAL HIGH (ref 0.0–2.0)
Bicarbonate: 34 mmol/L — ABNORMAL HIGH (ref 20.0–28.0)
Drawn by: 28338
FIO2: 30
O2 Saturation: 92.5 %
Patient temperature: 36.7
pCO2 arterial: 62.1 mmHg — ABNORMAL HIGH (ref 32.0–48.0)
pH, Arterial: 7.356 (ref 7.350–7.450)
pO2, Arterial: 59.7 mmHg — ABNORMAL LOW (ref 83.0–108.0)

## 2020-03-13 LAB — BASIC METABOLIC PANEL
Anion gap: 4 — ABNORMAL LOW (ref 5–15)
BUN: 13 mg/dL (ref 6–20)
CO2: 33 mmol/L — ABNORMAL HIGH (ref 22–32)
Calcium: 8.6 mg/dL — ABNORMAL LOW (ref 8.9–10.3)
Chloride: 103 mmol/L (ref 98–111)
Creatinine, Ser: 0.6 mg/dL — ABNORMAL LOW (ref 0.61–1.24)
GFR calc Af Amer: >60 mL/min (ref >60)
GFR calc non Af Amer: >60 mL/min (ref >60)
Glucose, Bld: 75 mg/dL (ref 70–99)
Potassium: 4.2 mmol/L (ref 3.5–5.1)
Sodium: 140 mmol/L (ref 135–145)

## 2020-03-13 LAB — GLUCOSE, CAPILLARY
Glucose-Capillary: 114 mg/dL — ABNORMAL HIGH (ref 70–99)
Glucose-Capillary: 109 mg/dL — ABNORMAL HIGH (ref 70–99)
Glucose-Capillary: 91 mg/dL (ref 70–99)
Glucose-Capillary: 73 mg/dL (ref 70–99)
Glucose-Capillary: 178 mg/dL — ABNORMAL HIGH (ref 70–99)
Glucose-Capillary: 164 mg/dL — ABNORMAL HIGH (ref 70–99)
Glucose-Capillary: 128 mg/dL — ABNORMAL HIGH (ref 70–99)

## 2020-03-13 LAB — MAGNESIUM: Magnesium: 1.6 mg/dL — ABNORMAL LOW (ref 1.7–2.4)

## 2020-03-13 LAB — PHOSPHORUS: Phosphorus: 2.6 mg/dL (ref 2.5–4.6)

## 2020-03-13 MED ORDER — MAGNESIUM SULFATE 2 GM/50ML IV SOLN
2.0000 g | Freq: Once | INTRAVENOUS | Status: AC
  Administered 2020-03-13: 2 g via INTRAVENOUS
  Filled 2020-03-13: qty 50

## 2020-03-13 NOTE — Progress Notes (Signed)
PROGRESS NOTE    Anthony Nelson  HTX:[id number] DOB: 1960/10/13 DOA: 03/08/2020 PCP: [name], MD    Brief Narrative:  This  59 years old male with history of ongoing tobacco abuse and COPD on 2L/ m home oxygen at baseline,  he was found to have altered mental status at home.  EMS found his oxygen saturation at 45% on room air , he was initially placed on nonrebreather with adequate sats then again he decompensated in the ED requiring BiPAP.  Patient was admitted initially in the ICU. He required few hours of BiPAP therapy.  His PCO2 has improved on ABG.  Patient is back to nasal cannula on 3 L saturating 100%.  PCCM pick up 9/9. He is admitted for acute on chronic hypoxic and hypercapnic respiratory failure requiring BiPAP.  He is on nasal cannula 3L/ m now saturating 94%.  His PCO2 is still elevated but better than before.  Patient refused to use BiPAP last night and in the morning ABG shows increased PCO2,  after detailed conversation patient agreed to use BiPAP.  Repeat ABG shows improving PCO2.   Assessment & Plan:   Principal Problem:   Acute on chronic respiratory failure with hypercapnia (HCC) Active Problems:   Cigarette smoker   COPD GOLD IV still smoking   Cor pulmonale (chronic) (HCC)   Chronic diastolic heart failure (HCC)   Chronic respiratory failure with hypoxia (HCC)   Acute on Chronic hypoxic/hypercapnic respiratory Failure sec. to Acute COPD exacerbation / PAH Initially admitted in the ICU. He was found to have PCO2 120 on initial ABG. Patient's pH has improved but continue to have hypercapnia with PCO2 97, much improved from more than 120 He is off BIPAP, Continue BIPAP at night or as needed. Saturation goals are > 88% Wean Oxygen as able Continue nebs Q6hr and Solu-Medrol 40 mg daily Will transition to prednisone at discharge. Patient has been noncompliant with BiPAP use which causes PCO2 retention and altered mentation. Explained to him in detail that he needs  to use BiPAP as needed.  Acute metabolic encephalopathy, due to hypercarbia >>> Improved. Patient was agitated and restless 9/7 Continue Haldol PRN. He is more alert, oriented,cooperative.  Hypomagnesemia:  Mag 1.6  Replaced, recheck labs in AM.  Hyperkalemia >>>Improved Monitor closely. Recheck labs.  Acute Kidney Injury >>>>improved Monitor serum creatinine. Avoid nephrotoxic medications  Acute on chronic respiratory acidosis with severe metabolic alkalosis - Improved He was given saline bolus. Continue acetazolamide  Recurrent hypoglycemia Patient became hypoglycemic 9/7, sliding scale insulin held. Monitor capillary glucose , FS improved. Resumed diet once off  BiPAP   DVT prophylaxis: heparin sq Code Status: Full Family Communication:  No one at bed side, D/w Patient Disposition Plan: Status is: Inpatient  Remains inpatient appropriate because:Inpatient level of care appropriate due to severity of illness   Dispo: The patient is from: Home              Anticipated d/c is to: Home              Anticipated d/c date is: 2 days              Patient currently is not medically stable to d/c.   Consultants:   PCCM  Procedures: Bipap , Echo    Antimicrobials: Anti-infectives (From admission, onward)   None     Subjective: Patient was seen and examined at bedside.  Overnight events noted.  He used BiPAP throughout the night and in the morning,  he was very alert oriented.  ABG shows improved PCO2.  Daughter was at bedside,  all questions answered. Objective: Vitals:   03/13/20 0742 03/13/20 0820 03/13/20 1228 03/13/20 1314  BP:  100/67 104/69   Pulse: 63 69 88   Resp:  (!) 24 16   Temp:  98 F (36.7 C) 98.5 F (36.9 C)   TempSrc:   Oral   SpO2: 97% 98% 97% 91%  Weight:      Height:        Intake/Output Summary (Last 24 hours) at 03/13/2020 1610 Last data filed at 03/13/2020 0900 Gross per 24 hour  Intake 0 ml  Output 1575 ml  Net -1575 ml    Filed Weights   03/10/20 0429 03/11/20 0404  Weight: 54.2 kg 56.9 kg    Examination:  General exam: Appears calm and comfortable , Alert, oriented x 3  Respiratory system: Clear to auscultation. Respiratory effort normal. No wheezing, No crackles. Cardiovascular system: S1 & S2 heard, RRR. No JVD, murmurs, rubs, gallops or clicks. No pedal edema. Gastrointestinal system: Abdomen is nondistended, soft and nontender. No organomegaly or masses felt.  Normal bowel sounds heard. Central nervous system: Alert and oriented. No focal neurological deficits. Extremities:  No edema, no cyanosis, no clubbing. Skin: No rashes, lesions or ulcers Psychiatry: Judgement and insight appear normal. Mood & affect appropriate.     Data Reviewed: I have personally reviewed following labs and imaging studies  CBC: Recent Labs  Lab 03/08/20 1141 03/08/20 1141 03/08/20 1615 03/09/20 0435 03/10/20 0955 03/11/20 0354 03/13/20 0734  WBC 6.4   < > 7.6 7.1 5.1 5.1 4.9  NEUTROABS 5.2  --   --   --   --   --   --   HGB 14.5   < > 15.0 13.3 13.1 12.5* 11.9*  HCT 48.1   < > 49.9 43.5 42.6 39.6 39.5  MCV 108.1*   < > 108.5* 106.1* 105.4* 105.0* 107.3*  PLT 146*   < > 132* 147* 130* 126* 118*   < > = values in this interval not displayed.   Basic Metabolic Panel: Recent Labs  Lab 03/08/20 1615 03/08/20 1615 03/09/20 0435 03/10/20 0955 03/11/20 0354 03/12/20 0455 03/13/20 0734  NA 133*   < > 136 137 137 143 140  K 5.1   < > 4.1 3.6 4.0 4.3 4.2  CL 76*   < > 79* 89* 95* 99 103  CO2 44*   < > 46* 43* 38* 39* 33*  GLUCOSE 89   < > 131* 100* 185* 121* 75  BUN 30*   < > 22* _0 CREATININE 1.08   < > 0.74 0.72 0.68 0.84 0.60*  CALCIUM 8.6*   < > 8.5* 8.5* 8.3* 9.0 8.6*  MG 1.7  --  1.5* 2.0 1.7  --  1.6*  PHOS 5.6*  --  3.3 2.6 2.8  --  2.6   < > = values in this interval not displayed.   GFR: Estimated Creatinine Clearance: 79 mL/min (A) (by C-G formula based on SCr of 0.6 mg/dL  (L)). Liver Function Tests: Recent Labs  Lab 03/08/20 1141 03/08/20 1615  AST 113* 102*  ALT 122* 134*  ALKPHOS 63 64  BILITOT 1.6* 0.9  PROT 5.5* 6.5  ALBUMIN 3.6 3.9   No results for input(s): LIPASE, AMYLASE in the last 168 hours. No results for input(s): AMMONIA in the last 168 hours. Coagulation Profile: No results for  input(s): INR, PROTIME in the last 168 hours. Cardiac Enzymes: No results for input(s): CKTOTAL, CKMB, CKMBINDEX, TROPONINI in the last 168 hours. BNP (last 3 results) No results for input(s): PROBNP in the last 8760 hours. HbA1C: No results for input(s): HGBA1C in the last 72 hours. CBG: Recent Labs  Lab 03/13/20 0053 03/13/20 0337 03/13/20 0529 03/13/20 0816 03/13/20 1226  GLUCAP 114* 109* 91 73 178*   Lipid Profile: No results for input(s): CHOL, HDL, LDLCALC, TRIG, CHOLHDL, LDLDIRECT in the last 72 hours. Thyroid Function Tests: No results for input(s): TSH, T4TOTAL, FREET4, T3FREE, THYROIDAB in the last 72 hours. Anemia Panel: No results for input(s): VITAMINB12, FOLATE, FERRITIN, TIBC, IRON, RETICCTPCT in the last 72 hours. Sepsis Labs: No results for input(s): PROCALCITON, LATICACIDVEN in the last 168 hours.  Recent Results (from the past 240 hour(s))  SARS Coronavirus 2 by RT PCR (hospital order, performed in [facility] hospital lab) Nasopharyngeal Nasopharyngeal Swab     Status: None   Collection Time: 03/08/20 11:41 AM   Specimen: Nasopharyngeal Swab  Result Value Ref Range Status   SARS Coronavirus 2 NEGATIVE NEGATIVE Final    Comment: (NOTE) SARS-CoV-2 target nucleic acids are NOT DETECTED.  The SARS-CoV-2 RNA is generally detectable in upper and lower respiratory specimens during the acute phase of infection. The lowest concentration of SARS-CoV-2 viral copies this assay can detect is 250 copies / mL. A negative result does not preclude SARS-CoV-2 infection and should not be used as the sole basis for treatment or other patient  management decisions.  A negative result may occur with improper specimen collection / handling, submission of specimen other than nasopharyngeal swab, presence of viral mutation(s) within the areas targeted by this assay, and inadequate number of viral copies (<250 copies / mL). A negative result must be combined with clinical observations, patient history, and epidemiological information.  Fact Sheet for Patients:   StrictlyIdeas.no  Fact Sheet for Healthcare Providers: BankingDealers.co.za  This test is not yet approved or  cleared by the [place] FDA and has been authorized for detection and/or diagnosis of SARS-CoV-2 by FDA under an Emergency Use Authorization (EUA).  This EUA will remain in effect (meaning this test can be used) for the duration of the COVID-19 declaration under Section 564(b)(1) of the Act, 21 U.S.C. section 360bbb-3(b)(1), unless the authorization is terminated or revoked sooner.  Performed at [facility], [place] [address] [place], [place] [address]      Radiology Studies: No results found.   Scheduled Meds: . famotidine  20 mg Oral BID  . feeding supplement (ENSURE ENLIVE)  237 mL Oral BID BM  . heparin  5,000 Units Subcutaneous Q8H  . ipratropium-albuterol  3 mL Nebulization TID  . methylPREDNISolone (SOLU-MEDROL) injection  40 mg Intravenous q1800  . nicotine  21 mg Transdermal Daily   Continuous Infusions: . dextrose 5 % and 0.9% NaCl 75 mL/hr at 03/13/20 0944     LOS: 5 days    Time spent: 25 mins.    Shawna Clamp, MD [facility]   If 7PM-7AM, please contact night-coverage

## 2020-03-14 LAB — GLUCOSE, CAPILLARY
Glucose-Capillary: 102 mg/dL — ABNORMAL HIGH (ref 70–99)
Glucose-Capillary: 108 mg/dL — ABNORMAL HIGH (ref 70–99)
Glucose-Capillary: 111 mg/dL — ABNORMAL HIGH (ref 70–99)
Glucose-Capillary: 121 mg/dL — ABNORMAL HIGH (ref 70–99)
Glucose-Capillary: 127 mg/dL — ABNORMAL HIGH (ref 70–99)
Glucose-Capillary: 131 mg/dL — ABNORMAL HIGH (ref 70–99)
Glucose-Capillary: 85 mg/dL (ref 70–99)

## 2020-03-14 LAB — MAGNESIUM: Magnesium: 1.8 mg/dL (ref 1.7–2.4)

## 2020-03-14 MED ORDER — ALBUTEROL SULFATE HFA 108 (90 BASE) MCG/ACT IN AERS: 2.0000 | INHALATION_SPRAY | Freq: Four times a day (QID) | RESPIRATORY_TRACT | Status: DC | PRN

## 2020-03-14 MED ORDER — ALBUTEROL SULFATE (2.5 MG/3ML) 0.083% IN NEBU: 2.5000 mg | INHALATION_SOLUTION | Freq: Four times a day (QID) | RESPIRATORY_TRACT | Status: DC | PRN

## 2020-03-14 MED ORDER — PREDNISONE 20 MG PO TABS
40.0000 mg | ORAL_TABLET | Freq: Every day | ORAL | Status: DC
  Administered 2020-03-15: 40 mg via ORAL
  Filled 2020-03-14: qty 2

## 2020-03-14 MED ORDER — UMECLIDINIUM BROMIDE 62.5 MCG/INH IN AEPB
1.0000 | INHALATION_SPRAY | Freq: Every day | RESPIRATORY_TRACT | Status: DC
  Administered 2020-03-14 – 2020-03-15 (×2): 1 via RESPIRATORY_TRACT
  Filled 2020-03-14: qty 7

## 2020-03-14 MED ORDER — MOMETASONE FURO-FORMOTEROL FUM 100-5 MCG/ACT IN AERO
2.0000 | INHALATION_SPRAY | Freq: Two times a day (BID) | RESPIRATORY_TRACT | Status: DC
  Administered 2020-03-14 – 2020-03-15 (×3): 2 via RESPIRATORY_TRACT
  Filled 2020-03-14: qty 8.8

## 2020-03-14 NOTE — Progress Notes (Signed)
   NAME:  Anthony Nelson, MRN:  [id number], DOB:  [birth year], LOS: 6 ADMISSION DATE:  03/08/2020, CONSULTATION DATE:  03/08/2020 REFERRING MD: Vanita Panda , CHIEF COMPLAINT:  Acute on chronic respiratory failure   Brief History   59 year old male current every day smoker with history of COPD, PAH,  and CHF  found listless at home 03/08/2020 EMS found with sats of 45% on RA. Initially placed on NRB with adequate sats, but then decompensated in the ED requiring BiPAP. ABG revealed pH of 7.183 and CO2 of > 120.  Had been stable then AM 9/12 had hypercapnia again (had been somewhat noncompliant with BiPAP per primary team MD, had gone a few nights refusing to wear it).  He was placed back on BiPAP and tolerated fine.  AM 9/13, PCCM asked to see again prior to discharge either later today or 9/14.  He is followed as outpatient by Dr. [name], last seen May 2021.  He tells me that he has a follow up with him soon.  Past Medical History   Past Medical History:  Diagnosis Date  . Bronchitis   . COPD (chronic obstructive pulmonary disease) ([place])   . Diastolic heart failure ([place])   . GERD (gastroesophageal reflux disease)   . Pulmonary hypertension ([place])   . 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 Respiratory failure with hypoxia ([place])     Significant Hospital Events   03/08/2020 Admission with hypercarbic respiratory failure  Consults:  03/08/2020>> PCCM  Procedures:    Significant Diagnostic Tests:  03/08/2020 CT head >> No acute intracranial abnormality Echo 9/8 > EF 60-65%.  Micro Data:  9/7 Covid negative  Antimicrobials:  None  Interim history/subjective:  No complaints.  States he feels good.  Is eager to go home. He says he will comply with nocturnal BiPAP (ordered by primary team already) and that he plans to follow up with Dr. [name] soon.  Objective   Blood pressure (!) 86/53, pulse 75, temperature 98 F (36.7 C), temperature source Oral,  resp. rate 19, height _0  (1.854 m), weight 54.4 kg, SpO2 96 %.        Intake/Output Summary (Last 24 hours) at 03/14/2020 1045 Last data filed at 03/14/2020 0504 Gross per 24 hour  Intake 240 ml  Output 1400 ml  Net -1160 ml   Filed Weights   03/10/20 0429 03/11/20 0404 03/14/20 0348  Weight: 54.2 kg 56.9 kg 54.4 kg    Examination: General: Chronically ill appearing elderly cachectic male,  Watching TV, in NAD HENT: NCAT, MMM Lungs: CTAB Cardiovascular:  RRR, no M/R/G Abdomen: BS x 4, S/NT/ND Extremities: No obvious deformities, warm and dry Neuro: MAE x 4, A&O x 3 Skin: Chronic changes, no rash  Assessment & Plan:   Acute on Chronic hypoxic/hypercapnic respiratory Failure Acute COPD exacerbation PAH Continue nocturnal BiPAP (primary team tells me that this has been ordered). Importance of compliance with BiPAP was stressed and pt voiced understanding. Continue BD's, transitioned back to home inhalers. D/c solumedrol and switch to prednisone.  Recommend slow steroid taper to off. Continue nicotine patch and encourage tobacco cessation. F/u with Dr. [name] as outpatient.  Nothing further to add.  PCCM will sign off.  Please do not hesitate to call us back if we can be of any further assistance.   Montey Hora, [facility] 03/14/2020, 10:45 AM

## 2020-03-14 NOTE — Progress Notes (Signed)
PROGRESS NOTE    Anthony Nelson  CVE:[id number] DOB: 1961/03/16 DOA: 03/08/2020 PCP: [name], MD    Brief Narrative:  This  59 years old male with history of ongoing tobacco abuse and COPD on 2L/ m home oxygen at baseline,  he was found to have altered mental status at home.  EMS found his oxygen saturation at 45% on room air , he was initially placed on nonrebreather with adequate sats then again he decompensated in the ED requiring BiPAP.  Patient was admitted initially in the ICU. He required few hours of BiPAP therapy.  His PCO2 has improved on ABG.  Patient is back to nasal cannula on 3 L saturating 100%.  PCCM pick up 9/9. He is admitted for acute on chronic hypoxic and hypercapnic respiratory failure requiring BiPAP.  He is on nasal cannula 3L/ m now saturating 94%.  His PCO2 is still elevated but better than before.  Patient refused to use BiPAP last night and in the morning ABG shows increased PCO2,  after detailed conversation patient agreed to use BiPAP.  Repeat ABG shows improving PCO2. CPAP needs to be arranged before patient can be discharged, Case manger notified.   Assessment & Plan:   Principal Problem:   Acute on chronic respiratory failure with hypercapnia (HCC) Active Problems:   Cigarette smoker   COPD GOLD IV still smoking   Cor pulmonale (chronic) (HCC)   Chronic diastolic heart failure (HCC)   Chronic respiratory failure with hypoxia (HCC)   Acute on Chronic hypoxic/hypercapnic respiratory Failure sec. to Acute COPD exacerbation / PAH Initially admitted in the ICU. He was found to have PCO2 120 on initial ABG. Patient's pH has improved but continue to have hypercapnia with PCO2 97, much improved from more than 120 He is off BIPAP, Continue BIPAP at night or as needed. Saturation goals are > 88% Wean Oxygen as able Continue nebs Q6hr and Solu-Medrol 40 mg daily Will transition to prednisone at discharge. Patient has been noncompliant with BiPAP use which causes  PCO2 retention and altered mentation. Explained to him in detail that he needs to use BiPAP as needed. Will arrange CPAP before discharge, He needs outpatient sleep studies. He needs outpatient pulmonology follow up.  Acute metabolic encephalopathy, due to hypercarbia >>> Improved. Patient was agitated and restless 9/7 Continue Haldol PRN. He is more alert, oriented,cooperative.  Hypomagnesemia:  Mag 1.6  Recheck Mag 1.8  Hyperkalemia >>>Improved Monitor closely. Recheck labs.  Acute Kidney Injury >>>>improved Monitor serum creatinine. Avoid nephrotoxic medications  Acute on chronic respiratory acidosis with severe metabolic alkalosis - Improved He was given saline bolus. IV fluids discontinued. Continue acetazolamide  Recurrent hypoglycemia Patient became hypoglycemic 9/7, sliding scale insulin held. Monitor capillary glucose , FS improved. Resumed diet once off  BiPAP   DVT prophylaxis: heparin sq Code Status: Full Family Communication:  No one at bed side, D/w Patient Disposition Plan: Status is: Inpatient  Remains inpatient appropriate because:Inpatient level of care appropriate due to severity of illness.   Dispo: The patient is from: Home              Anticipated d/c is to: Home              Anticipated d/c date is: 1 days              Patient currently is not medically stable to d/c.   Consultants:   PCCM  Procedures: Bipap , Echo    Antimicrobials: Anti-infectives (From admission, onward)  None     Subjective: Patient was seen and examined at bedside.  Overnight events noted.  He reports feeling better.  He is on 3 L supplemental oxygen saturating 92%. Her blood pressure is slightly low, Explained to him in detail that he needs to continue BiPAP at night.  CPAP will be arranged before he can be discharged.   Objective: Vitals:   03/14/20 0925 03/14/20 0927 03/14/20 1033 03/14/20 1338  BP: 103/68  (!) 86/53 103/71  Pulse: 84  75 87    Resp: _0 Temp: 97.9 F (36.6 C)  98 F (36.7 C) 98.5 F (36.9 C)  TempSrc: Oral  Oral Oral  SpO2: 93% 93% 96% 96%  Weight:      Height:        Intake/Output Summary (Last 24 hours) at 03/14/2020 1610 Last data filed at 03/14/2020 1300 Gross per 24 hour  Intake --  Output 2000 ml  Net -2000 ml   Filed Weights   03/10/20 0429 03/11/20 0404 03/14/20 0348  Weight: 54.2 kg 56.9 kg 54.4 kg    Examination:  General exam: Appears calm and comfortable , Alert, oriented x 3.  Respiratory system: Clear to auscultation. Respiratory effort normal. No wheezing, No crackles. Cardiovascular system: S1 & S2 heard, RRR. No JVD, murmurs, rubs, gallops or clicks. No pedal edema. Gastrointestinal system: Abdomen is nondistended, soft and nontender. No organomegaly or masses felt.  Normal bowel sounds heard. Central nervous system: Alert and oriented. No focal neurological deficits. Extremities:  No edema, no cyanosis, no clubbing. Skin: No rashes, lesions or ulcers Psychiatry: Judgement and insight appear normal. Mood & affect appropriate.     Data Reviewed: I have personally reviewed following labs and imaging studies  CBC: Recent Labs  Lab 03/08/20 1141 03/08/20 1141 03/08/20 1615 03/09/20 0435 03/10/20 0955 03/11/20 0354 03/13/20 0734  WBC 6.4   < > 7.6 7.1 5.1 5.1 4.9  NEUTROABS 5.2  --   --   --   --   --   --   HGB 14.5   < > 15.0 13.3 13.1 12.5* 11.9*  HCT 48.1   < > 49.9 43.5 42.6 39.6 39.5  MCV 108.1*   < > 108.5* 106.1* 105.4* 105.0* 107.3*  PLT 146*   < > 132* 147* 130* 126* 118*   < > = values in this interval not displayed.   Basic Metabolic Panel: Recent Labs  Lab 03/08/20 1615 03/08/20 1615 03/09/20 0435 03/10/20 0955 03/11/20 0354 03/12/20 0455 03/13/20 0734 03/14/20 0348  NA 133*   < > 136 137 137 143 140  --   K 5.1   < > 4.1 3.6 4.0 4.3 4.2  --   CL 76*   < > 79* 89* 95* 99 103  --   CO2 44*   < > 46* 43* 38* 39* 33*  --   GLUCOSE 89   < >  131* 100* 185* 121* 75  --   BUN 30*   < > 22* _1 --   CREATININE 1.08   < > 0.74 0.72 0.68 0.84 0.60*  --   CALCIUM 8.6*   < > 8.5* 8.5* 8.3* 9.0 8.6*  --   MG 1.7   < > 1.5* 2.0 1.7  --  1.6* 1.8  PHOS 5.6*  --  3.3 2.6 2.8  --  2.6  --    < > = values in this interval not displayed.  GFR: Estimated Creatinine Clearance: 75.6 mL/min (A) (by C-G formula based on SCr of 0.6 mg/dL (L)). Liver Function Tests: Recent Labs  Lab 03/08/20 1141 03/08/20 1615  AST 113* 102*  ALT 122* 134*  ALKPHOS 63 64  BILITOT 1.6* 0.9  PROT 5.5* 6.5  ALBUMIN 3.6 3.9   No results for input(s): LIPASE, AMYLASE in the last 168 hours. No results for input(s): AMMONIA in the last 168 hours. Coagulation Profile: No results for input(s): INR, PROTIME in the last 168 hours. Cardiac Enzymes: No results for input(s): CKTOTAL, CKMB, CKMBINDEX, TROPONINI in the last 168 hours. BNP (last 3 results) No results for input(s): PROBNP in the last 8760 hours. HbA1C: No results for input(s): HGBA1C in the last 72 hours. CBG: Recent Labs  Lab 03/13/20 2053 03/14/20 0107 03/14/20 0536 03/14/20 0924 03/14/20 1336  GLUCAP 128* 127* 121* 85 131*   Lipid Profile: No results for input(s): CHOL, HDL, LDLCALC, TRIG, CHOLHDL, LDLDIRECT in the last 72 hours. Thyroid Function Tests: No results for input(s): TSH, T4TOTAL, FREET4, T3FREE, THYROIDAB in the last 72 hours. Anemia Panel: No results for input(s): VITAMINB12, FOLATE, FERRITIN, TIBC, IRON, RETICCTPCT in the last 72 hours. Sepsis Labs: No results for input(s): PROCALCITON, LATICACIDVEN in the last 168 hours.  Recent Results (from the past 240 hour(s))  SARS Coronavirus 2 by RT PCR (hospital order, performed in [facility] hospital lab) Nasopharyngeal Nasopharyngeal Swab     Status: None   Collection Time: 03/08/20 11:41 AM   Specimen: Nasopharyngeal Swab  Result Value Ref Range Status   SARS Coronavirus 2 NEGATIVE NEGATIVE Final    Comment:  (NOTE) SARS-CoV-2 target nucleic acids are NOT DETECTED.  The SARS-CoV-2 RNA is generally detectable in upper and lower respiratory specimens during the acute phase of infection. The lowest concentration of SARS-CoV-2 viral copies this assay can detect is 250 copies / mL. A negative result does not preclude SARS-CoV-2 infection and should not be used as the sole basis for treatment or other patient management decisions.  A negative result may occur with improper specimen collection / handling, submission of specimen other than nasopharyngeal swab, presence of viral mutation(s) within the areas targeted by this assay, and inadequate number of viral copies (<250 copies / mL). A negative result must be combined with clinical observations, patient history, and epidemiological information.  Fact Sheet for Patients:   StrictlyIdeas.no  Fact Sheet for Healthcare Providers: BankingDealers.co.za  This test is not yet approved or  cleared by the [place] FDA and has been authorized for detection and/or diagnosis of SARS-CoV-2 by FDA under an Emergency Use Authorization (EUA).  This EUA will remain in effect (meaning this test can be used) for the duration of the COVID-19 declaration under Section 564(b)(1) of the Act, 21 U.S.C. section 360bbb-3(b)(1), unless the authorization is terminated or revoked sooner.  Performed at [facility], [place] [address], [place], [place] [address]      Radiology Studies: No results found.   Scheduled Meds: . famotidine  20 mg Oral BID  . feeding supplement (ENSURE ENLIVE)  237 mL Oral BID BM  . heparin  5,000 Units Subcutaneous Q8H  . mometasone-formoterol  2 puff Inhalation BID  . nicotine  21 mg Transdermal Daily  . [START ON 03/15/2020] predniSONE  40 mg Oral Q breakfast  . umeclidinium bromide  1 puff Inhalation Daily   Continuous Infusions: . dextrose 5 % and 0.9% NaCl Stopped (03/14/20  1047)     LOS: 6 days  Time spent: 25 mins.    Shawna Clamp, MD [facility]   If 7PM-7AM, please contact night-coverage

## 2020-03-14 NOTE — Progress Notes (Signed)
Physical Therapy Treatment Patient Details Name: Anthony Nelson MRN: [id number] DOB: 1961/04/11 Today's Date: 03/14/2020    History of Present Illness 59 year old male with COPD who was brought into the emergency department with complaint of shortness of breath was found to be hypoxemic with O2 sat 45% on room air, initially was placed on nonrebreather mask. His ABG revealed pH of 7.1 with PCO2 over 120, he was placed on BiPAP. Admitted 03/08/20 for treatment of acute hypoxic/hypercapnic respiratory failure and Acuted COPD exacerbation.     PT Comments    Demonstrating significant improvements today.  He was able to ambulate 350' with rollator on 3 LPM O2 with stable sats.  Required min cues for breathing technique and use of rollator.  Educated on safe transfers and progression of activity at home.  Cont POC.     Follow Up Recommendations  Home health PT;Supervision for mobility/OOB     Equipment Recommendations  Other (comment) (rollator)    Recommendations for Other Services       Precautions / Restrictions Precautions Precautions: Fall Precaution Comments: O2 sats Restrictions Weight Bearing Restrictions: No    Mobility  Bed Mobility Overal bed mobility: Independent                Transfers Overall transfer level: Needs assistance Equipment used: 4-wheeled walker Transfers: Sit to/from Stand Sit to Stand: Supervision         General transfer comment: educated on brakes for rollator  Ambulation/Gait Ambulation/Gait assistance: Supervision Gait Distance (Feet): 350 Feet Assistive device: 4-wheeled walker Gait Pattern/deviations: Step-through pattern Gait velocity: slowed   General Gait Details: cues for RW; assist for line management; no rest breaks needed; cued for pursed lip breathing   Stairs             Wheelchair Mobility    Modified Rankin (Stroke Patients Only)       Balance Overall balance assessment: Needs assistance Sitting-balance  support: No upper extremity supported Sitting balance-Leahy Scale: Good     Standing balance support: No upper extremity supported Standing balance-Leahy Scale: Good                              Cognition Arousal/Alertness: Awake/alert Behavior During Therapy: WFL for tasks assessed/performed Overall Cognitive Status: Within Functional Limits for tasks assessed                                        Exercises      General Comments General comments (skin integrity, edema, etc.): Pt on 3 LPM O2 with sats 99%; ambulated on 3 LPM and sats 93%; back to 98% at rest.  Educated on home mobility, gradual increase in activity, and use of rollator as needed for rest breaks      Pertinent Vitals/Pain Pain Assessment: No/denies pain    Home Living                      Prior Function            PT Goals (current goals can now be found in the care plan section) Acute Rehab PT Goals Patient Stated Goal: return home PT Goal Formulation: With patient Time For Goal Achievement: 03/25/20 Potential to Achieve Goals: Good Progress towards PT goals: Progressing toward goals    Frequency    Min 3X/week  PT Plan Current plan remains appropriate    Co-evaluation              AM-PAC PT "6 Clicks" Mobility   Outcome Measure  Help needed turning from your back to your side while in a flat bed without using bedrails?: None Help needed moving from lying on your back to sitting on the side of a flat bed without using bedrails?: None Help needed moving to and from a bed to a chair (including a wheelchair)?: None Help needed standing up from a chair using your arms (e.g., wheelchair or bedside chair)?: None Help needed to walk in hospital room?: None Help needed climbing 3-5 steps with a railing? : A Little 6 Click Score: 23    End of Session Equipment Utilized During Treatment: Gait belt;Oxygen Activity Tolerance: Patient tolerated  treatment well Patient left: in chair;with chair alarm set;with family/visitor present;with call bell/phone within reach Nurse Communication: Mobility status PT Visit Diagnosis: Difficulty in walking, not elsewhere classified (R26.2)     Time: 7944-4619 PT Time Calculation (min) (ACUTE ONLY): 26 min  Charges:  $Gait Training: 8-22 mins $Therapeutic Activity: 8-22 mins                     Abran Richard, PT Acute Rehab Services Pager [phone] [facility] [place] 03/14/2020, 1:26 PM

## 2020-03-14 NOTE — TOC Progression Note (Signed)
Transition of Care [facility]) - Progression Note    Patient Details  Name: Anthony Nelson MRN: [id number] Date of Birth: January 16, 1961  Transition of Care [facility]) CM/SW Contact  Zenon Mayo, RN Phone Number: 03/14/2020, 4:44 PM  Clinical Narrative:    Patient will need a bipap, he does not have insurance listed, will need to do a LOG for a Bipap,  The machine will cost 630.00 and the mask is 109 for a total of 739.  Will advise supervisor.        Expected Discharge Plan and Services                                                 Social Determinants of Health (SDOH) Interventions    Readmission Risk Interventions No flowsheet data found.

## 2020-03-15 ENCOUNTER — Ambulatory Visit: Payer: Medicaid Other | Admitting: Critical Care Medicine

## 2020-03-15 LAB — GLUCOSE, CAPILLARY
Glucose-Capillary: 90 mg/dL (ref 70–99)
Glucose-Capillary: 87 mg/dL (ref 70–99)
Glucose-Capillary: 167 mg/dL — ABNORMAL HIGH (ref 70–99)

## 2020-03-15 MED ORDER — METHYLPREDNISOLONE 4 MG PO TBPK: ORAL_TABLET | ORAL | 0 refills | Status: DC

## 2020-03-15 MED FILL — ?METHYLPREDNISOLONE 4MG TAB: 4 | 6 days supply | Qty: 21 | Fill #0

## 2020-03-15 NOTE — Discharge Instructions (Signed)
Advised to follow-up with primary care physician in 1 week. Advised to follow-up with pulmonologist Dr. [name] as scheduled. Patient has been discharged home with home services. CPAP has been arranged for the patient to use it at night and as needed for difficulty breathing. Advised to take prednisone as directed.

## 2020-03-15 NOTE — Discharge Summary (Signed)
Physician Discharge Summary  Anthony Nelson EZM:[id number] DOB: 1960/11/03 DOA: 03/08/2020  PCP: [name], MD  Admit date: 03/08/2020 .  Discharge date: 03/15/2020  Admitted From:  Home. Disposition:  Home with home services  Recommendations for Outpatient Follow-up:  1. Follow up with PCP in 1-2 weeks. 2. Please obtain BMP/CBC in one week. 3.   Advised to follow-up with pulmonologist Dr. [name] as scheduled. 4.   Patient has been discharged home with home services. 5.   BIPAP has been arranged for the patient to use it at night and as needed for difficulty breathing. 6.   Advised to take prednisone as directed.  Home Health: Yes / PT. Equipment/Devices:Oxygen and BIPAP  Discharge Condition: Stable CODE STATUS:Full code Diet recommendation: Heart Healthy   Brief Summary / Hospital course: This 59 years old male with history of ongoing tobacco abuse and COPD on 2L/ m home oxygen at baseline,  he was found to have altered mental status at home.  EMS found his oxygen saturation at 45% on room air , he was initially placed on nonrebreather with adequate sats then again he decompensated in the ED requiring BiPAP.  Patient was admitted initially in the ICU. He required few hours of BiPAP therapy.  His PCO2 has improved on ABG.  Patient is back to nasal cannula on 3 L saturating 100%.  PCCM pick up 9/9. He was admitted for acute on chronic hypoxic and hypercapnic respiratory failure requiring BiPAP.  He is on nasal cannula 3L/ m now saturating 94%. His PCO2 was still elevated but better than before.  Patient refused to use BiPAP twice during night and became lethargic in the morning with increased PCO2,  After detailed conversation and frequents conversions he has agreed to use BIPAP. Repeat ABG shows improving PCO2. Patient was using BIPAP at night and as needed afterwards, He feels significantly better, cleared form pulmonology for discharge.  Patient will follow up with pulmonology Dr. [name] as  scheduled.  Patient is being discharged on prednisone taper.  Patient was counseled to quit smoking indefinitely and he agreed.  He was managed for below problems.   Discharge Diagnoses:  Principal Problem:   Acute on chronic respiratory failure with hypercapnia (HCC) Active Problems:   Cigarette smoker   COPD GOLD IV still smoking   Cor pulmonale (chronic) (HCC)   Chronic diastolic heart failure (HCC)   Chronic respiratory failure with hypoxia (HCC)   Acute on Chronichypoxic/hypercapnic respiratory Failure sec. to AcuteCOPDexacerbation / [place] Initially admitted in the ICU. He was found to have PCO2 120 on initial ABG. Patient's pH has improved but continue to have hypercapnia with PCO2 97, much improved from more than 120 He is off BIPAP, Continue BIPAP at night or as needed. Saturation goals are > 88% Wean Oxygen as able Continue nebs Q6hr and Solu-Medrol 40 mg daily Will transition to prednisone at discharge. Patient has been noncompliant with BiPAP use which causes PCO2 retention and altered mentation. Explained to him in detail that he needs to use BiPAP as needed.  Acute metabolic encephalopathy, due to hypercarbia >>> Improved. Patient was agitated and restless 9/7 Continue Haldol PRN. He is more alert, oriented,cooperative.  Hypomagnesemia: Replaced, recheck labs in AM.  Hyperkalemia >>>Improved Monitor closely. Recheck labs.  Acute Kidney Injury >>>>improved Monitor serum creatinine. Avoid nephrotoxic medications  Acute on chronic respiratory acidosis with severe metabolic alkalosis - Improved He was given saline bolus. Continue acetazolamide  Recurrent hypoglycemia Patient became hypoglycemic 9/7, sliding scale insulin held. Monitor  capillary glucose , FS improved. Resumed diet once off  BiPAP   Discharge Instructions  Discharge Instructions    Diet - low sodium heart healthy   Complete by: As directed    Diet - low sodium heart healthy    Complete by: As directed    Diet Carb Modified   Complete by: As directed    Increase activity slowly   Complete by: As directed    Increase activity slowly   Complete by: As directed      Allergies as of 03/15/2020      Reactions   Erythromycin Other (See Comments)   Allergy from childhood- does not take it      Medication List    STOP taking these medications   nicotine polacrilex 2 MG lozenge Commonly known as: Nicorette Mini   predniSONE 10 MG tablet Commonly known as: DELTASONE     TAKE these medications   albuterol (2.5 MG/3ML) 0.083% nebulizer solution Commonly known as: PROVENTIL Take 3 mLs (2.5 mg total) by nebulization every 6 (six) hours as needed for wheezing or shortness of breath.   albuterol 108 (90 Base) MCG/ACT inhaler Commonly known as: VENTOLIN HFA Inhale 2 puffs into the lungs every 6 (six) hours as needed for wheezing or shortness of breath.   budesonide-formoterol 160-4.5 MCG/ACT inhaler Commonly known as: SYMBICORT Inhale 2 puffs into the lungs 2 (two) times daily.   docusate sodium 100 MG capsule Commonly known as: COLACE Take 1 capsule (100 mg total) by mouth daily.   famotidine 20 MG tablet Commonly known as: PEPCID Take 20 mg by mouth 2 (two) times daily.   furosemide 20 MG tablet Commonly known as: Lasix Take 1 tablet (20 mg total) by mouth daily.   Incruse Ellipta 62.5 MCG/INH Aepb Generic drug: umeclidinium bromide Inhale 1 puff into the lungs daily.   methylPREDNISolone 4 MG Tbpk tablet Commonly known as: MEDROL DOSEPAK Take as advised.   nicotine 7 mg/24hr patch Commonly known as: NICODERM CQ - dosed in mg/24 hr Place 7 mg onto the skin daily.   OXYGEN Inhale 2 L/min into the lungs continuous.   potassium chloride SA 20 MEQ tablet Commonly known as: KLOR-CON Take 1 tablet (20 mEq total) by mouth daily.   Vitamin D3 50 MCG (2000 UT) Tabs Take 2,000 Units by mouth daily.            Durable Medical Equipment   (From admission, onward)         Start     Ordered   03/15/20 1252  DME Oxygen  Once       Question Answer Comment  Length of Need 6 Months   Mode or (Route) Nasal cannula   Liters per Minute 3   Frequency Continuous (stationary and portable oxygen unit needed)   Oxygen conserving device Yes   Oxygen delivery system Gas      03/15/20 1252   03/15/20 1252  DME continuous positive airway pressure (CPAP)  Once       Question Answer Comment  Length of Need Lifetime   Patient has OSA or probable OSA Yes   Is the patient currently using CPAP in the home No   Settings Autotitration   Signs and symptoms of probable OSA  (select all that apply) Snoring   CPAP supplies needed Mask, headgear, cushions, filters, heated tubing and water chamber      03/15/20 1252   03/14/20 1640  For home use only DME Bipap  Once  Comments: With full face mask and supplies  Question Answer Comment  Length of Need Lifetime   Bleed in oxygen (LPM) 2   Keep 02 saturation above 90   Inspiratory pressure 8   Expiratory pressure 20      03/14/20 1641          Follow-up Information    [facility] Follow up.   Why: Rolling walker with seat- to be delivered to the room prior to transition home.  Contact information: [place] [address] [phone]        Elsie Stain, MD Follow up on 03/15/2020.   Specialty: Pulmonary Disease Why: Telehealth appointment @ 11:00 am- wife to call to make sure the office has the correct # on file.  Contact information: [address] [address] [phone]        [name], MD Follow up in 1 week(s).   Specialty: Family Medicine Contact information: [place] [address] [phone]        Skeet Latch, MD .   Specialty: Cardiology Contact information: [address] [place] [address] [phone]              Allergies  Allergen Reactions    Erythromycin Other (See Comments)    Allergy from childhood- does not take it    Consultations:  Pulmonology   Procedures/Studies: CT Head Wo Contrast  Result Date: 03/08/2020 CLINICAL DATA:  Mental status change EXAM: CT HEAD WITHOUT CONTRAST TECHNIQUE: Contiguous axial images were obtained from the base of the skull through the vertex without intravenous contrast. COMPARISON:  None. FINDINGS: Brain: There is no acute intracranial hemorrhage, mass effect, or edema. Gray-white differentiation is preserved. There is no extra-axial fluid collection. Ventricles and sulci are within normal limits in size and configuration. Vascular: There is atherosclerotic calcification at the skull base. Skull: Calvarium is unremarkable. Sinuses/Orbits: No acute finding. Other: None. IMPRESSION: No acute intracranial abnormality. Electronically Signed   By: Macy Mis M.D.   On: 03/08/2020 13:18   DG Chest Port 1 View  Result Date: 03/09/2020 CLINICAL DATA:  59 year old male with acute on chronic respiratory failure. Shortness of breath. EXAM: PORTABLE CHEST 1 VIEW COMPARISON:  Portable chest 03/08/2020 and earlier. FINDINGS: Portable AP semi upright view at 0725 hours. Chronic pulmonary hyperinflation with stable cardiac and mediastinal contours. Heart size remains within normal limits. No pneumothorax, pulmonary edema, pleural effusion or acute pulmonary opacity. No acute osseous abnormality identified. Negative visible bowel gas pattern. IMPRESSION: Chronic pulmonary hyperinflation. No acute cardiopulmonary abnormality. Electronically Signed   By: Genevie Ann M.D.   On: 03/09/2020 07:36   DG Chest Port 1 View  Result Date: 03/08/2020 CLINICAL DATA:  Shortness of breath. EXAM: PORTABLE CHEST 1 VIEW COMPARISON:  April 24, 2019. FINDINGS: Stable cardiomediastinal silhouette. No pneumothorax or pleural effusion is noted. Both lungs are clear. The visualized skeletal structures are unremarkable. IMPRESSION: No  active disease. Electronically Signed   By: Marijo Conception M.D.   On: 03/08/2020 12:10   ECHOCARDIOGRAM COMPLETE  Result Date: 03/09/2020    ECHOCARDIOGRAM REPORT   Patient Name:   EZIO WIECK Date of Exam: 03/09/2020 Medical Rec #:  [id number]    Height:       71.0 in Accession #:    [id number]   Weight:       116.0 lb Date of Birth:  Feb 27, 1961     BSA:  1.673 m Patient Age:    76 years     BP:           107/76 mmHg Patient Gender: M            HR:           75 bpm. Exam Location:  Inpatient Procedure: 2D Echo and 3D Echo Indications:    CHF-Acute Diastolic 539.76 / B34.19  History:        Patient has prior history of Echocardiogram examinations, most                 recent 02/08/2018. COPD and Pulmonary HTN,                 Signs/Symptoms:Shortness of Breath and Altered mental status;                 Risk Factors:Former Smoker. Respiratory failure with hypoxia.  Sonographer:    Darlina Sicilian RDCS Referring Phys: [place] Comments: Difficult to images patient on BiPap. IMPRESSIONS  1. Severe RV failure.  2. Left ventricular ejection fraction, by estimation, is 60 to 65%. The left ventricle has normal function. The left ventricle has no regional wall motion abnormalities. Left ventricular diastolic parameters were normal.  3. Right ventricular systolic function is severely reduced. The right ventricular size is severely enlarged. There is moderately elevated pulmonary artery systolic pressure.  4. The mitral valve is normal in structure. Trivial mitral valve regurgitation. No evidence of mitral stenosis.  5. Tricuspid valve regurgitation is mild to moderate.  6. The aortic valve is tricuspid. Aortic valve regurgitation is not visualized. Mild aortic valve sclerosis is present, with no evidence of aortic valve stenosis.  7. The inferior vena cava is dilated in size with <50% respiratory variability, suggesting right atrial pressure of 15 mmHg. FINDINGS  Left Ventricle: Left ventricular  ejection fraction, by estimation, is 60 to 65%. The left ventricle has normal function. The left ventricle has no regional wall motion abnormalities. The left ventricular internal cavity size was normal in size. There is  no left ventricular hypertrophy. Left ventricular diastolic parameters were normal. Right Ventricle: The right ventricular size is severely enlarged. Right vetricular wall thickness was not assessed. Right ventricular systolic function is severely reduced. There is moderately elevated pulmonary artery systolic pressure. The tricuspid regurgitant velocity is 2.74 m/s, and with an assumed right atrial pressure of 15 mmHg, the estimated right ventricular systolic pressure is 37.9 mmHg. Left Atrium: Left atrial size was normal in size. Right Atrium: Right atrial size was normal in size. Pericardium: There is no evidence of pericardial effusion. Mitral Valve: The mitral valve is normal in structure. Trivial mitral valve regurgitation. No evidence of mitral valve stenosis. Tricuspid Valve: The tricuspid valve is normal in structure. Tricuspid valve regurgitation is mild to moderate. No evidence of tricuspid stenosis. Aortic Valve: The aortic valve is tricuspid. Aortic valve regurgitation is not visualized. Mild aortic valve sclerosis is present, with no evidence of aortic valve stenosis. Pulmonic Valve: The pulmonic valve was normal in structure. Pulmonic valve regurgitation is not visualized. No evidence of pulmonic stenosis. Aorta: The aortic root is normal in size and structure. Venous: The inferior vena cava is dilated in size with less than 50% respiratory variability, suggesting right atrial pressure of 15 mmHg. IAS/Shunts: No atrial level shunt detected by color flow Doppler. Additional Comments: Severe RV failure.  LEFT VENTRICLE PLAX 2D LVIDd:  4.30 cm  Diastology LVIDs:         2.60 cm  LV e' medial:    5.98 cm/s LV PW:         0.90 cm  LV E/e' medial:  5.1 LV IVS:        0.80 cm  LV e'  lateral:   5.66 cm/s LVOT diam:     2.00 cm  LV E/e' lateral: 5.4 LVOT Area:     3.14 cm  RIGHT VENTRICLE RV S prime:     10.80 cm/s TAPSE (M-mode): 1.6 cm LEFT ATRIUM           Index LA diam:      3.30 cm 1.97 cm/m LA Vol (A4C): 22.3 ml 13.33 ml/m   AORTA Ao Root diam: 3.30 cm MITRAL VALVE               TRICUSPID VALVE MV Area (PHT): 3.08 cm    TR Peak grad:   30.0 mmHg MV Decel Time: 246 msec    TR Vmax:        274.00 cm/s MV E velocity: 30.60 cm/s MV A velocity: 42.10 cm/s  SHUNTS MV E/A ratio:  0.73        Systemic Diam: 2.00 cm Jenkins Rouge MD Electronically signed by Jenkins Rouge MD Signature Date/Time: 03/09/2020/3:33:12 PM    Final        Subjective: Patient was seen and examined at bedside.  Overnight events noted.  Patient been very compliant in the last couple of days.  His O2 saturation has significantly improved.  He feels better, BIPAP machine has been arranged for him to use at home.  Discharge Exam: Vitals:   03/15/20 0850 03/15/20 1212  BP:  108/69  Pulse: 83 87  Resp: 20 19  Temp:  98.4 F (36.9 C)  SpO2: 94% 93%   Vitals:   03/15/20 0350 03/15/20 0837 03/15/20 0850 03/15/20 1212  BP: (!) 90/58 92/64  108/69  Pulse: 64 71 83 87  Resp: _0 Temp: 98.1 F (36.7 C) 98.1 F (36.7 C)  98.4 F (36.9 C)  TempSrc: Axillary Axillary  Oral  SpO2: 96% 99% 94% 93%  Weight:      Height:        General: Pt is alert, awake, not in acute distress Cardiovascular: RRR, S1/S2 +, no rubs, no gallops Respiratory: CTA bilaterally, no wheezing, no rhonchi Abdominal: Soft, NT, ND, bowel sounds + Extremities: no edema, no cyanosis    The results of significant diagnostics from this hospitalization (including imaging, microbiology, ancillary and laboratory) are listed below for reference.     Microbiology: Recent Results (from the past 240 hour(s))  SARS Coronavirus 2 by RT PCR (hospital order, performed in [facility] hospital lab) Nasopharyngeal Nasopharyngeal Swab      Status: None   Collection Time: 03/08/20 11:41 AM   Specimen: Nasopharyngeal Swab  Result Value Ref Range Status   SARS Coronavirus 2 NEGATIVE NEGATIVE Final    Comment: (NOTE) SARS-CoV-2 target nucleic acids are NOT DETECTED.  The SARS-CoV-2 RNA is generally detectable in upper and lower respiratory specimens during the acute phase of infection. The lowest concentration of SARS-CoV-2 viral copies this assay can detect is 250 copies / mL. A negative result does not preclude SARS-CoV-2 infection and should not be used as the sole basis for treatment or other patient management decisions.  A negative result may occur with improper specimen collection / handling, submission of  specimen other than nasopharyngeal swab, presence of viral mutation(s) within the areas targeted by this assay, and inadequate number of viral copies (<250 copies / mL). A negative result must be combined with clinical observations, patient history, and epidemiological information.  Fact Sheet for Patients:   StrictlyIdeas.no  Fact Sheet for Healthcare Providers: BankingDealers.co.za  This test is not yet approved or  cleared by the [place] FDA and has been authorized for detection and/or diagnosis of SARS-CoV-2 by FDA under an Emergency Use Authorization (EUA).  This EUA will remain in effect (meaning this test can be used) for the duration of the COVID-19 declaration under Section 564(b)(1) of the Act, 21 U.S.C. section 360bbb-3(b)(1), unless the authorization is terminated or revoked sooner.  Performed at [facility], [place] [address] [place], [place] [address]      Labs: BNP (last 3 results) Recent Labs    04/24/19 1952 03/08/20 1141  BNP 1,169.3* 4,580.9*   Basic Metabolic Panel: Recent Labs  Lab 03/08/20 1615 03/08/20 1615 03/09/20 0435 03/10/20 0955 03/11/20 0354 03/12/20 0455 03/13/20 0734 03/14/20 0348  NA 133*   < > 136  137 137 143 140  --   K 5.1   < > 4.1 3.6 4.0 4.3 4.2  --   CL 76*   < > 79* 89* 95* 99 103  --   CO2 44*   < > 46* 43* 38* 39* 33*  --   GLUCOSE 89   < > 131* 100* 185* 121* 75  --   BUN 30*   < > 22* _0 --   CREATININE 1.08   < > 0.74 0.72 0.68 0.84 0.60*  --   CALCIUM 8.6*   < > 8.5* 8.5* 8.3* 9.0 8.6*  --   MG 1.7   < > 1.5* 2.0 1.7  --  1.6* 1.8  PHOS 5.6*  --  3.3 2.6 2.8  --  2.6  --    < > = values in this interval not displayed.   Liver Function Tests: Recent Labs  Lab 03/08/20 1615  AST 102*  ALT 134*  ALKPHOS 64  BILITOT 0.9  PROT 6.5  ALBUMIN 3.9   No results for input(s): LIPASE, AMYLASE in the last 168 hours. No results for input(s): AMMONIA in the last 168 hours. CBC: Recent Labs  Lab 03/08/20 1615 03/09/20 0435 03/10/20 0955 03/11/20 0354 03/13/20 0734  WBC 7.6 7.1 5.1 5.1 4.9  HGB 15.0 13.3 13.1 12.5* 11.9*  HCT 49.9 43.5 42.6 39.6 39.5  MCV 108.5* 106.1* 105.4* 105.0* 107.3*  PLT 132* 147* 130* 126* 118*   Cardiac Enzymes: No results for input(s): CKTOTAL, CKMB, CKMBINDEX, TROPONINI in the last 168 hours. BNP: Invalid input(s): POCBNP CBG: Recent Labs  Lab 03/14/20 2053 03/14/20 2336 03/15/20 0347 03/15/20 0835 03/15/20 1209  GLUCAP 111* 102* 90 87 167*   D-Dimer No results for input(s): DDIMER in the last 72 hours. Hgb A1c No results for input(s): HGBA1C in the last 72 hours. Lipid Profile No results for input(s): CHOL, HDL, LDLCALC, TRIG, CHOLHDL, LDLDIRECT in the last 72 hours. Thyroid function studies No results for input(s): TSH, T4TOTAL, T3FREE, THYROIDAB in the last 72 hours.  Invalid input(s): FREET3 Anemia work up No results for input(s): VITAMINB12, FOLATE, FERRITIN, TIBC, IRON, RETICCTPCT in the last 72 hours. Urinalysis    Component Value Date/Time   COLORURINE STRAW (A) 03/08/2020 1624   APPEARANCEUR CLEAR 03/08/2020 1624   LABSPEC 1.006  03/08/2020 1624   PHURINE 5.0 03/08/2020 1624   GLUCOSEU NEGATIVE  03/08/2020 1624   HGBUR NEGATIVE 03/08/2020 1624   BILIRUBINUR NEGATIVE 03/08/2020 1624   KETONESUR NEGATIVE 03/08/2020 1624   PROTEINUR NEGATIVE 03/08/2020 1624   NITRITE NEGATIVE 03/08/2020 1624   LEUKOCYTESUR NEGATIVE 03/08/2020 1624   Sepsis Labs Invalid input(s): PROCALCITONIN,  WBC,  LACTICIDVEN Microbiology Recent Results (from the past 240 hour(s))  SARS Coronavirus 2 by RT PCR (hospital order, performed in [place] hospital lab) Nasopharyngeal Nasopharyngeal Swab     Status: None   Collection Time: 03/08/20 11:41 AM   Specimen: Nasopharyngeal Swab  Result Value Ref Range Status   SARS Coronavirus 2 NEGATIVE NEGATIVE Final    Comment: (NOTE) SARS-CoV-2 target nucleic acids are NOT DETECTED.  The SARS-CoV-2 RNA is generally detectable in upper and lower respiratory specimens during the acute phase of infection. The lowest concentration of SARS-CoV-2 viral copies this assay can detect is 250 copies / mL. A negative result does not preclude SARS-CoV-2 infection and should not be used as the sole basis for treatment or other patient management decisions.  A negative result may occur with improper specimen collection / handling, submission of specimen other than nasopharyngeal swab, presence of viral mutation(s) within the areas targeted by this assay, and inadequate number of viral copies (<250 copies / mL). A negative result must be combined with clinical observations, patient history, and epidemiological information.  Fact Sheet for Patients:   StrictlyIdeas.no  Fact Sheet for Healthcare Providers: BankingDealers.co.za  This test is not yet approved or  cleared by the [place] FDA and has been authorized for detection and/or diagnosis of SARS-CoV-2 by FDA under an Emergency Use Authorization (EUA).  This EUA will remain in effect (meaning this test can be used) for the duration of the COVID-19 declaration under  Section 564(b)(1) of the Act, 21 U.S.C. section 360bbb-3(b)(1), unless the authorization is terminated or revoked sooner.  Performed at [facility], [place] [address], [place], [place] [address]      Time coordinating discharge: Over 30 minutes  SIGNED:   Shawna Clamp, MD  [facility] 03/15/2020, 12:54 PM Pager   If 7PM-7AM, please contact night-coverage [email or web address]

## 2020-03-15 NOTE — Progress Notes (Deleted)
Subjective:    Patient ID: Anthony Nelson, male    DOB: [birth year], 59 y.o.   MRN: [id number]   06/09/19 This is a 59 year old male with gold stage D COPD on home oxygen.  The patient's been followed over time by the pulmonary clinic with Dr. Melvyn Novas.  His last visit was in 2019.  The patient is still actively smoking at this time.  The patient has been on a variety of inhalers but currently takes Incruse daily and Symbicort 160  twoinhalations twice daily.  Patient states overall his level of dyspnea is stable he states he has minimal cough.  He is not had recent exacerbations.  He does state when he ran out of his inhalers and was under a lot of stress he started smoking again his breathing worsened.  He is trying to go back to the nicotine patches and focusing on smoking cessation.  He does not have excess secretions at this time.  Pulmonary functions done in 2019 revealed FEV1 of less than 30% predicted consistent with gold stage D COPD  Note the patient also has pulmonary hypertension and cor pulmonale on the basis of the patient's COPD  The patient is on oxygen 2 L continuous at home   06/24/2019 This is the first in office visit as the last visit was a telephone visit.  This patient has gold stage IV COPD still actively smoking.  He has had issues with access to some of the inhalers however we were able to achieve at the last telephone visit patient assistance for Symbicort 160 and Incruse and is on both these medicines at this time.  He also maintains oxygen 2 L continuous.  Note he had been hospitalized in October.  During the October admission he had significant fluid retention and evidence of pulmonary hypertension with left ventricular hypertrophy  Today he notes he has increased weight and increased swelling in the feet and has not been using his furosemide on a regular basis.  He is under a great deal stress because in April of this year his son was apparently murdered and he and his wife  both of been in significant depression over this  He comes in today with oxygen saturation of 82% walking from the lobby into the exam room on 2 L  He is still smoking 3 to 4 cigarettes daily Review shortness of breath assessment below  08/11/2019  This patient returns today in follow-up for advanced COPD has continued to lose weight is down to 115 pounds with a BMI of 15.6  He has early satiety and has increased gas retention and aerophagia.  He maintains Symbicort at 2 inhalations twice daily and Incruse 1 puff a day  The patient also is using furosemide for chronic diastolic heart failure and does maintain an increased level of oxygen from 2 L rest to 4 L exertion  With the use of nicotine replacement therapy the patient is down to 4 cigarettes daily he does not drink alcohol  The mucus he is producing is clear with a less productive cough noted  The patient still has some element of wheezing  11/12/2019 Patient is seen in return follow-up for chronic obstructive lung disease and unexplained weight loss. Patient states his level of dyspnea is at baseline.  He is maintaining Symbicort twice daily and Incruse daily.  He does maintain furosemide for diastolic heart failure and oxygen 2 L rest for exertion.  He does have stage IV COPD based on previous spirometry.  Patient still smoking 3 cigarettes daily.  He is applying for disability remains anxious over this.  Wt Readings from Last 3 Encounters: 11/12/19 : 116 lb (52.6 kg) 08/11/19 : 115 lb (52.2 kg) 06/24/19 : 121 lb (54.9 kg)  Note at the last visit he had an elevated CEA level and we ordered a CT chest and abdomen that this is yet to be completed  03/15/2020  COPD GOLD IV still smoking Gold stage D COPD with continued smoking  I spent a considerable amount of time at this visit with smoking cessation counseling recommended continue nicotine replacement therapy the patient is yet to set a quit date he has been given educational  information I also spent time a behavioral modification at this visit giving him coping meals when he is triggered to smoke  Patient to maintain current inhaled medications as prescribed and oxygen as prescribed.  I told him once he obtains Medicaid and disability we can consider getting him a portable oxygen concentrator  Weight loss Ongoing weight loss which actually stabilized over the last several months  I gave the patient samples of high-protein Ensure that I recommended he use to avoid the high carbohydrate contents of other nutritional supplements  I am obtaining a CT scan of the abdomen and chest with a high CEA level system make sure that he does not have an underlying malignancy   Diagnoses and all orders for this visit:  COPD GOLD IV still smoking -     CT Chest W Contrast; Future  Colon cancer screening -     Cancel: Fecal occult blood, imunochemical; Future  Cor pulmonale (chronic) (HCC)  Chronic respiratory failure with hypoxia (HCC)  Weight loss -     CT Chest W Contrast; Future -     Cancel: CT ABDOMEN PELVIS W WO CONTRAST; Future -     CT ABDOMEN PELVIS W CONTRAST; Future  Elevated CEA -     Cancel: CT ABDOMEN PELVIS W WO CONTRAST; Future -     CT ABDOMEN PELVIS W CONTRAST; Future  Abnormal findings on diagnostic imaging of lung -     CT Chest W Contrast; Future   Post hosp  Acute on chronic hypoxic/hypercarbic respiratory failure Acute COPD exacerbation.  Reviewed PW consultation from 10/2019 Spirometry 03/2018-FEV1 0.70/22%  On exam -barrel chest, no accessory muscle use, decreased breath sounds bilateral, no rhonchi, no edema, S1-S2 regular.  Impression -Although BiPAP has helped him during this hospitalization, he does not like the idea of using nocturnal BiPAP indefinitely.  I explained how BiPAP has helped improve his hypercarbia.  I also discussed severity of his COPD which has caused hypercarbic respiratory failure and implications of this  diagnosis.  His wife also tried to convince him and he was finally agreeable to using BiPAP.  Recommend -Continue Symbicort and Incruse, home medications on discharge. -Case manager to try to obtain nocturnal BiPAP on discharge.  If this is not possible then NIV prescription can be provided.  I explained to the patient that there may be an out-of-pocket cost for him -I explained the need for compliance  He can follow-up with Dr. [name] after discharge and follow-up with the pulmonary office as needed   Shortness of Breath This is a chronic problem. The current episode started more than 1 year ago. The problem occurs daily. The problem has been gradually improving. Associated symptoms include sputum production. Pertinent negatives include no chest pain, claudication, fever, headaches, hemoptysis, leg swelling, PND, rhinorrhea, sore throat  or wheezing. The symptoms are aggravated by any activity, weather changes and lying flat. Associated symptoms comments: Mucus is dark.  Cloudy . Risk factors include smoking. He has tried beta agonist inhalers, ipratropium inhalers and steroid inhalers for the symptoms. The treatment provided significant relief. His past medical history is significant for COPD.    Past Medical History:  Diagnosis Date  . Bronchitis   . COPD (chronic obstructive pulmonary disease) ([place])   . Diastolic heart failure ([place])   . GERD (gastroesophageal reflux disease)   . Pulmonary hypertension ([place])   . 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 ([place]) 04/10/2018  . Respiratory failure with hypoxia (HCC)      Family History  Problem Relation Age of Onset  . Cancer Mother   . Cancer Father   . CAD Father   . Heart failure Father   . Diabetes Maternal Grandmother   . Kidney disease Maternal Grandmother   . Kidney cancer Paternal Grandmother   . Heart attack Paternal Grandfather      Social History   Socioeconomic History  . Marital status:  Married    Spouse name: Cailean Heacock  . Number of children: 3  . Years of education: 16  . Highest education level: Bachelor's degree (e.g., BA, AB, BS)  Occupational History  . Occupation: Presenter, broadcasting  Tobacco Use  . Smoking status: Current Every Day Smoker    Packs/day: 0.10    Years: 44.00    Pack years: 4.40    Types: Cigarettes  . Smokeless tobacco: Never Used  Vaping Use  . Vaping Use: Never used  Substance and Sexual Activity  . Alcohol use: Not Currently    Comment: Socially   . Drug use: No  . Sexual activity: Not Currently  Other Topics Concern  . Not on file  Social History Narrative  . Not on file   Social Determinants of Health   Financial Resource Strain:   . Difficulty of Paying Living Expenses: Not on file  Food Insecurity:   . Worried About Charity fundraiser in the Last Year: Not on file  . Ran Out of Food in the Last Year: Not on file  Transportation Needs:   . Lack of Transportation (Medical): Not on file  . Lack of Transportation (Non-Medical): Not on file  Physical Activity:   . Days of Exercise per Week: Not on file  . Minutes of Exercise per Session: Not on file  Stress:   . Feeling of Stress : Not on file  Social Connections:   . Frequency of Communication with Friends and Family: Not on file  . Frequency of Social Gatherings with Friends and Family: Not on file  . Attends Religious Services: Not on file  . Active Member of Clubs or Organizations: Not on file  . Attends Archivist Meetings: Not on file  . Marital Status: Not on file  Intimate Partner Violence:   . Fear of Current or Ex-Partner: Not on file  . Emotionally Abused: Not on file  . Physically Abused: Not on file  . Sexually Abused: Not on file     Allergies  Allergen Reactions  . Erythromycin Other (See Comments)    Allergy from childhood- does not take it     Facility-Administered Medications Prior to Visit  Medication Dose Route Frequency Provider  Last Rate Last Admin  . albuterol (PROVENTIL) (2.5 MG/3ML) 0.083% nebulizer solution 2.5 mg  2.5 mg Nebulization Q6H PRN Shawna Clamp, MD      .  dextrose 5 %-0.9 % sodium chloride infusion   Intravenous Continuous Judd Lien, MD   Stopped at 03/14/20 1047  . docusate sodium (COLACE) capsule 100 mg  100 mg Oral BID PRN Magdalen Spatz, NP   100 mg at 03/11/20 2103  . famotidine (PEPCID) tablet 20 mg  20 mg Oral BID Shawna Clamp, MD   20 mg at 03/14/20 2229  . feeding supplement (ENSURE ENLIVE) (ENSURE ENLIVE) liquid 237 mL  237 mL Oral BID BM Shawna Clamp, MD   237 mL at 03/14/20 1338  . haloperidol lactate (HALDOL) injection 1 mg  1 mg Intravenous Q6H PRN Jacky Kindle, MD      . heparin injection 5,000 Units  5,000 Units Subcutaneous Q8H Magdalen Spatz, NP   5,000 Units at 03/15/20 [phone]  . mometasone-formoterol (DULERA) 100-5 MCG/ACT inhaler 2 puff  2 puff Inhalation BID Shearon Stalls, Rahul P, PA-C   2 puff at 03/14/20 2058  . nicotine (NICODERM CQ - dosed in mg/24 hours) patch 21 mg  21 mg Transdermal Daily Judd Lien, MD   21 mg at 03/14/20 1043  . ondansetron (ZOFRAN) injection 4 mg  4 mg Intravenous Q6H PRN Magdalen Spatz, NP      . polyethylene glycol (MIRALAX / GLYCOLAX) packet 17 g  17 g Oral Daily PRN Magdalen Spatz, NP   17 g at 03/13/20 1856  . predniSONE (DELTASONE) tablet 40 mg  40 mg Oral Q breakfast Desai, Rahul P, PA-C      . umeclidinium bromide (INCRUSE ELLIPTA) 62.5 MCG/INH 1 puff  1 puff Inhalation Daily Desai, Rahul P, PA-C   1 puff at 03/14/20 1338   Outpatient Medications Prior to Visit  Medication Sig Dispense Refill  . albuterol (PROVENTIL) (2.5 MG/3ML) 0.083% nebulizer solution Take 3 mLs (2.5 mg total) by nebulization every 6 (six) hours as needed for wheezing or shortness of breath. 75 mL 0  . albuterol (VENTOLIN HFA) 108 (90 Base) MCG/ACT inhaler Inhale 2 puffs into the lungs every 6 (six) hours as needed for wheezing or shortness of breath. 18 g 0  .  budesonide-formoterol (SYMBICORT) 160-4.5 MCG/ACT inhaler Inhale 2 puffs into the lungs 2 (two) times daily. 1 Inhaler 1  . Cholecalciferol (VITAMIN D3) 50 MCG (2000 UT) TABS Take 2,000 Units by mouth daily.    [place] Kitchen docusate sodium (COLACE) 100 MG capsule Take 1 capsule (100 mg total) by mouth daily. 10 capsule 0  . famotidine (PEPCID) 20 MG tablet Take 20 mg by mouth 2 (two) times daily.    . furosemide (LASIX) 20 MG tablet Take 1 tablet (20 mg total) by mouth daily. 30 tablet 1  . nicotine (NICODERM CQ - DOSED IN MG/24 HR) 7 mg/24hr patch Place 7 mg onto the skin daily.    . nicotine polacrilex (NICORETTE MINI) 2 MG lozenge Use 3-4 times daily to quit smoking (Patient not taking: Reported on 03/08/2020) 100 lozenge 2  . OXYGEN Inhale 2 L/min into the lungs continuous.     . potassium chloride SA (KLOR-CON) 20 MEQ tablet Take 1 tablet (20 mEq total) by mouth daily. 30 tablet 3  . predniSONE (DELTASONE) 10 MG tablet Take 4 tablets daily for 5 days then stop (Patient not taking: Reported on 03/08/2020) 20 tablet 0  . umeclidinium bromide (INCRUSE ELLIPTA) 62.5 MCG/INH AEPB Inhale 1 puff into the lungs daily. 1 each 11     Review of Systems  Constitutional: Positive for unexpected weight change. Negative for fever.  HENT: Negative for rhinorrhea and sore throat.   Respiratory: Positive for sputum production and shortness of breath. Negative for cough, hemoptysis, chest tightness and wheezing.   Cardiovascular: Negative for chest pain, claudication, leg swelling and PND.  Gastrointestinal: Negative.   Genitourinary: Negative.   Musculoskeletal: Negative.   Neurological: Negative for headaches.  Psychiatric/Behavioral: Negative.        Objective:   Physical Exam    There were no vitals filed for this visit.  Gen: Pleasant, thin, in no distress,  normal affect  ENT: No lesions,  mouth clear,  oropharynx clear, no postnasal drip  Neck: No JVD, no TMG, no carotid bruits  Lungs: No use of  accessory muscles, no dullness to percussion, distant breath sounds with hyperresonance to percussion, no wheezes noted  Cardiovascular: RRR, heart sounds normal, no murmur or gallops, 1+ peripheral edema  Abdomen: soft and NT, no HSM,  BS normal  Musculoskeletal: No deformities, no cyanosis or clubbing  Neuro: alert, non focal  Skin: Warm, no lesions or rashes   Assessment & Plan:  I personally reviewed all images and lab data in the [facility] system as well as any outside material available during this office visit and agree with the  radiology impressions.   No problem-specific Assessment & Plan notes found for this encounter.   There are no diagnoses linked to this encounter.

## 2020-03-15 NOTE — TOC Transition Note (Signed)
Transition of Care [facility]) - CM/SW Discharge Note   Patient Details  Name: Anthony Nelson MRN: [id number] Date of Birth: Aug 05, 1960  Transition of Care [facility] CM/SW Contact:  Zenon Mayo, RN Phone Number: 03/15/2020, 2:02 PM   Clinical Narrative:    Patient is for dc today, NCM supplied bipap thru LOG with [employer],  They will be at the hospital at 3 pm to set up bipap for patient.  NCM informed Staff RN and wife, wife will go to [facility] to pick up medications and come back.  Patient already has home oxygen with [employer] 2 liters also.    Final next level of care: Home/Self Care Barriers to Discharge: No Barriers Identified   Patient Goals and CMS Choice Patient states their goals for this hospitalization and ongoing recovery are:: get better   Choice offered to / list presented to : NA  Discharge Placement                       Discharge Plan and Services                DME Arranged: Bipap DME Agency: [employer] Date DME Agency Contacted: 03/14/20 Time DME Agency Contacted: 39 Representative spoke with at DME Agency: [place]: NA          Social Determinants of Health ([place]) Interventions     Readmission Risk Interventions No flowsheet data found.

## 2020-03-16 ENCOUNTER — Telehealth: Payer: Self-pay

## 2020-03-16 NOTE — Telephone Encounter (Signed)
Transition Care Management Follow-up Telephone Call    Date of discharge and from where: 03/15/2020, [facility]   How have you been since you were released from the hospital? He said he is feeling well.   Any questions or concerns?  none at this time   Items Reviewed:  Did the pt receive and understand the discharge instructions provided?  yes  Medications obtained and verified? he said that he has all medications and did not have any questions about his med regime.   Any new allergies since your discharge?  none reported   Do you have support at home?  yes  Has walker   Has home O2, using it at 2L most of the time. Has BiPAP to use at night   Functional Questionnaire: (I = Independent and D = Dependent) ADLs: independent  Follow up appointments reviewed:   PCP Hospital f/u appt confirmed?.Dr Chapman Fitch 03/25/2020 @ [facility] f/u appt confirmed?. Needs to schedule follow up with Dr [name]  Are transportation arrangements needed?  no  If their condition worsens, is the pt aware to call PCP or go to the Emergency Dept.?  yes  Was the patient provided with contact information for the PCP's office or ED?  he has the phone number for the clinic  Was to pt encouraged to call back with questions or concerns? yes

## 2020-03-25 ENCOUNTER — Encounter: Payer: Self-pay | Admitting: Family Medicine

## 2020-03-25 ENCOUNTER — Ambulatory Visit: Payer: Medicaid Other | Attending: Family Medicine | Admitting: Family Medicine

## 2020-03-25 ENCOUNTER — Other Ambulatory Visit: Payer: Self-pay

## 2020-03-25 VITALS — BP 103/68 | HR 80 | Ht 73.0 in | Wt 113.6 lb

## 2020-03-25 DIAGNOSIS — J9611 Chronic respiratory failure with hypoxia: Secondary | ICD-10-CM

## 2020-03-25 DIAGNOSIS — E875 Hyperkalemia: Secondary | ICD-10-CM

## 2020-03-25 DIAGNOSIS — Z79899 Other long term (current) drug therapy: Secondary | ICD-10-CM

## 2020-03-25 DIAGNOSIS — Z09 Encounter for follow-up examination after completed treatment for conditions other than malignant neoplasm: Secondary | ICD-10-CM

## 2020-03-25 DIAGNOSIS — J449 Chronic obstructive pulmonary disease, unspecified: Secondary | ICD-10-CM

## 2020-03-25 DIAGNOSIS — Z23 Encounter for immunization: Secondary | ICD-10-CM

## 2020-03-25 DIAGNOSIS — I5032 Chronic diastolic (congestive) heart failure: Secondary | ICD-10-CM

## 2020-03-25 NOTE — Progress Notes (Signed)
Established Patient Office Visit  Subjective:  Patient ID: Dandre Sisler, male    DOB: [birth year]  Age: 59 y.o. MRN: [id number]  CC:  Chief Complaint  Patient presents with  . Hospitalization Follow-up    HPI Daquan Crapps, 59 year old male who presents in follow-up of recent hospitalization for acute on chronic respiratory failure. He reports that since his hospitalization, he has stopped smoking cigarettes. He reports that he is trying to use the BiPAP nightly but is having some difficulty getting used to it. He reports compliance with all of his current medications. Per wife, patient's appetite is good. He is eating 3 meals a day plus Ensure. Patient denies any current increase in shortness of breath and no increased cough or chest congestion. He has had no fever or chills. He intends to have his COVID-19 immunization. He is agreeable to having influenza immunization at today's visit. Overall, he feels better than during his recent hospitalization.  Past Medical History:  Diagnosis Date  . Bronchitis   . COPD (chronic obstructive pulmonary disease) ([place])   . Diastolic heart failure ([place])   . GERD (gastroesophageal reflux disease)   . Pulmonary hypertension ([place])   . 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 Respiratory failure with hypoxia [facility])     Past Surgical History:  Procedure Laterality Date  . HERNIA REPAIR      Family History  Problem Relation Age of Onset  . Cancer Mother   . Cancer Father   . CAD Father   . Heart failure Father   . Diabetes Maternal Grandmother   . Kidney disease Maternal Grandmother   . Kidney cancer Paternal Grandmother   . Heart attack Paternal Grandfather     Social History   Socioeconomic History  . Marital status: Married    Spouse name: Drako Maese  . Number of children: 3  . Years of education: 16  . Highest education level: Bachelor's degree (e.g., BA, AB, BS)  Occupational  History  . Occupation: Presenter, broadcasting  Tobacco Use  . Smoking status: Current Every Day Smoker    Packs/day: 0.10    Years: 44.00    Pack years: 4.40    Types: Cigarettes  . Smokeless tobacco: Never Used  Vaping Use  . Vaping Use: Never used  Substance and Sexual Activity  . Alcohol use: Not Currently    Comment: Socially   . Drug use: No  . Sexual activity: Not Currently  Other Topics Concern  . Not on file  Social History Narrative  . Not on file   Social Determinants of Health   Financial Resource Strain:   . Difficulty of Paying Living Expenses: Not on file  Food Insecurity:   . Worried About Charity fundraiser in the Last Year: Not on file  . Ran Out of Food in the Last Year: Not on file  Transportation Needs:   . Lack of Transportation (Medical): Not on file  . Lack of Transportation (Non-Medical): Not on file  Physical Activity:   . Days of Exercise per Week: Not on file  . Minutes of Exercise per Session: Not on file  Stress:   . Feeling of Stress : Not on file  Social Connections:   . Frequency of Communication with Friends and Family: Not on file  . Frequency of Social Gatherings with Friends and Family: Not on file  . Attends Religious Services: Not on file  . Active Member of Clubs or Organizations:  Not on file  . Attends Archivist Meetings: Not on file  . Marital Status: Not on file  Intimate Partner Violence:   . Fear of Current or Ex-Partner: Not on file  . Emotionally Abused: Not on file  . Physically Abused: Not on file  . Sexually Abused: Not on file    Outpatient Medications Prior to Visit  Medication Sig Dispense Refill  . albuterol (PROVENTIL) (2.5 MG/3ML) 0.083% nebulizer solution Take 3 mLs (2.5 mg total) by nebulization every 6 (six) hours as needed for wheezing or shortness of breath. 75 mL 0  . albuterol (VENTOLIN HFA) 108 (90 Base) MCG/ACT inhaler Inhale 2 puffs into the lungs every 6 (six) hours as needed for wheezing or  shortness of breath. 18 g 0  . budesonide-formoterol (SYMBICORT) 160-4.5 MCG/ACT inhaler Inhale 2 puffs into the lungs 2 (two) times daily. 1 Inhaler 1  . Cholecalciferol (VITAMIN D3) 50 MCG (2000 UT) TABS Take 2,000 Units by mouth daily.    Marland Kitchen docusate sodium (COLACE) 100 MG capsule Take 1 capsule (100 mg total) by mouth daily. 10 capsule 0  . famotidine (PEPCID) 20 MG tablet Take 20 mg by mouth 2 (two) times daily.    . furosemide (LASIX) 20 MG tablet Take 1 tablet (20 mg total) by mouth daily. 30 tablet 1  . methylPREDNISolone (MEDROL DOSEPAK) 4 MG TBPK tablet Take as advised. 21 tablet 0  . nicotine (NICODERM CQ - DOSED IN MG/24 HR) 7 mg/24hr patch Place 7 mg onto the skin daily.    . OXYGEN Inhale 2 L/min into the lungs continuous.     . potassium chloride SA (KLOR-CON) 20 MEQ tablet Take 1 tablet (20 mEq total) by mouth daily. 30 tablet 3  . umeclidinium bromide (INCRUSE ELLIPTA) 62.5 MCG/INH AEPB Inhale 1 puff into the lungs daily. 1 each 11   No facility-administered medications prior to visit.    Allergies  Allergen Reactions  . Erythromycin Other (See Comments)    Allergy from childhood- does not take it    ROS Review of Systems  Constitutional: Positive for fatigue. Negative for chills and fever.  HENT: Negative for sore throat and trouble swallowing.   Respiratory: Positive for shortness of breath.   Cardiovascular: Negative for chest pain, palpitations and leg swelling.  Gastrointestinal: Negative for abdominal pain, constipation, diarrhea and nausea.  Endocrine: Negative for polydipsia, polyphagia and polyuria.  Genitourinary: Negative for dysuria and frequency.  Musculoskeletal: Negative for arthralgias and back pain.  Skin: Negative for rash and wound.  Neurological: Negative for dizziness and headaches.  Hematological: Negative for adenopathy. Does not bruise/bleed easily.  Psychiatric/Behavioral: Negative for suicidal ideas. The patient is not nervous/anxious.        Objective:    Physical Exam Nursing note reviewed.  Constitutional:      General: He is not in acute distress.    Comments: Thin framed elderly male in NAD wearing oxygen by [place] at 2 liters sitting on exam table. He is accompanied by his wife at today's visit  Neck:     Vascular: No carotid bruit.  Cardiovascular:     Rate and Rhythm: Normal rate and regular rhythm.  Pulmonary:     Effort: Pulmonary effort is normal.     Breath sounds: Normal breath sounds. No wheezing or rhonchi.  Abdominal:     Palpations: Abdomen is soft.     Tenderness: There is no abdominal tenderness. There is no right CVA tenderness, left CVA tenderness, guarding or  rebound.  Musculoskeletal:     Right lower leg: No edema.     Left lower leg: No edema.  Lymphadenopathy:     Cervical: No cervical adenopathy.  Skin:    General: Skin is warm and dry.  Neurological:     General: No focal deficit present.     Mental Status: He is alert and oriented to person, place, and time.  Psychiatric:        Mood and Affect: Mood normal.        Behavior: Behavior normal.     BP 103/68   Pulse 80   Ht _0  (1.854 m)   Wt 113 lb 9.6 oz (51.5 kg)   SpO2 97%   BMI 14.99 kg/m  Wt Readings from Last 3 Encounters:  03/25/20 113 lb 9.6 oz (51.5 kg)  03/14/20 119 lb 14.9 oz (54.4 kg)  11/12/19 116 lb (52.6 kg)     Health Maintenance Due  Topic Date Due  . COVID-19 Vaccine (1) Never done  . INFLUENZA VACCINE  01/31/2020   Will receive influenza immunization today and is going to have COVID-19 immunization at his local pharmacy  Lab Results  Component Value Date   TSH 1.757 04/25/2019   Lab Results  Component Value Date   WBC 4.9 03/13/2020   HGB 11.9 (L) 03/13/2020   HCT 39.5 03/13/2020   MCV 107.3 (H) 03/13/2020   PLT 118 (L) 03/13/2020   Lab Results  Component Value Date   NA 140 03/13/2020   K 4.2 03/13/2020   CO2 33 (H) 03/13/2020   GLUCOSE 75 03/13/2020   BUN 13 03/13/2020    CREATININE 0.60 (L) 03/13/2020   BILITOT 0.9 03/08/2020   ALKPHOS 64 03/08/2020   AST 102 (H) 03/08/2020   ALT 134 (H) 03/08/2020   PROT 6.5 03/08/2020   ALBUMIN 3.9 03/08/2020   CALCIUM 8.6 (L) 03/13/2020   ANIONGAP 4 (L) 03/13/2020   No results found for: CHOL No results found for: HDL No results found for: LDLCALC No results found for: TRIG No results found for: CHOLHDL Lab Results  Component Value Date   HGBA1C 5.9 (H) 03/08/2020      Assessment & Plan:  1. Hospital discharge follow-up 2. Chronic respiratory failure with hypoxia (HCC) 3. Stage 4 very severe chronic obstructive pulmonary disease by Global Initiative for Chronic Obstructive Lung Disease classification ([place]) 4. Chronic diastolic heart failure ([place]) 5. Encounter for long-term (current) use of medications 6. Hyperkalemia 7. Hypermagnesemia He reports that he does feel better status post hospitalization but is still getting used to the BiPAP.  He reports no current issues with fever or chills and no productive cough.  Appetite is improved.  He is to continue his medications as per hospital discharge.  Will arrange for patient to follow-up with pulmonologist here in this clinic.  He is to continue compliance with home oxygen therapy in addition to use of BiPAP.  He reports that he will receive Covid immunization at his local pharmacy.  He was congratulated on smoking cessation and patient and wife state that all cigarettes have been removed from the household.  CHF appears to be stable.  He will have CBC and basic metabolic panel as per hospital discharge.  He did have issues with hyperkalemia and hypomagnesemia during his hospitalization.  We will also repeat magnesium level. -CBC with differential -Basic metabolic panel -Magnesium  8. Need for immunization against influenza Influenza immunization given at today's visit as patient did not  receive this during his recent hospitalization on review of immunizations in  chart.  He also received educational information regarding influenza immunization. - Flu Vaccine QUAD 36+ mos IM    Follow-up: Return in about 4 weeks (around 04/22/2020) for COPD- needs to see Dr. [name] in 3-4 weeks please.   [name], MD

## 2020-03-26 LAB — CBC WITH DIFFERENTIAL/PLATELET
Basophils Absolute: 0.1 x10E3/uL (ref 0.0–0.2)
Basos: 1 %
EOS (ABSOLUTE): 0.1 x10E3/uL (ref 0.0–0.4)
Eos: 2 %
Hematocrit: 38.5 % (ref 37.5–51.0)
Hemoglobin: 13.5 g/dL (ref 13.0–17.7)
Immature Grans (Abs): 0 x10E3/uL (ref 0.0–0.1)
Immature Granulocytes: 1 %
Lymphocytes Absolute: 1 x10E3/uL (ref 0.7–3.1)
Lymphs: 19 %
MCH: 34.4 pg — ABNORMAL HIGH (ref 26.6–33.0)
MCHC: 35.1 g/dL (ref 31.5–35.7)
MCV: 98 fL — ABNORMAL HIGH (ref 79–97)
Monocytes Absolute: 0.4 x10E3/uL (ref 0.1–0.9)
Monocytes: 6 %
Neutrophils Absolute: 4 x10E3/uL (ref 1.4–7.0)
Neutrophils: 71 %
Platelets: 175 x10E3/uL (ref 150–450)
RBC: 3.92 x10E6/uL — ABNORMAL LOW (ref 4.14–5.80)
RDW: 13.1 % (ref 11.6–15.4)
WBC: 5.6 x10E3/uL (ref 3.4–10.8)

## 2020-03-26 LAB — BASIC METABOLIC PANEL WITH GFR
BUN/Creatinine Ratio: 19 (ref 10–24)
BUN: 11 mg/dL (ref 8–27)
CO2: 37 mmol/L — ABNORMAL HIGH (ref 20–29)
Calcium: 9.1 mg/dL (ref 8.6–10.2)
Chloride: 92 mmol/L — ABNORMAL LOW (ref 96–106)
Creatinine, Ser: 0.58 mg/dL — ABNORMAL LOW (ref 0.76–1.27)
GFR calc Af Amer: 128 mL/min/1.73
GFR calc non Af Amer: 111 mL/min/1.73
Glucose: 89 mg/dL (ref 65–99)
Potassium: 4.3 mmol/L (ref 3.5–5.2)
Sodium: 140 mmol/L (ref 134–144)

## 2020-03-26 LAB — MAGNESIUM: Magnesium: 1.8 mg/dL (ref 1.6–2.3)

## 2020-03-28 NOTE — Progress Notes (Signed)
Normal results letter sent.

## 2020-04-06 ENCOUNTER — Other Ambulatory Visit: Payer: Self-pay

## 2020-04-06 ENCOUNTER — Ambulatory Visit (INDEPENDENT_AMBULATORY_CARE_PROVIDER_SITE_OTHER): Payer: Medicaid Other | Admitting: Cardiovascular Disease

## 2020-04-06 ENCOUNTER — Encounter: Payer: Self-pay | Admitting: Cardiovascular Disease

## 2020-04-06 VITALS — BP 110/68 | HR 90 | Ht 71.5 in | Wt 122.4 lb

## 2020-04-06 DIAGNOSIS — Z5181 Encounter for therapeutic drug level monitoring: Secondary | ICD-10-CM

## 2020-04-06 DIAGNOSIS — I2723 Pulmonary hypertension due to lung diseases and hypoxia: Secondary | ICD-10-CM

## 2020-04-06 DIAGNOSIS — Z13220 Encounter for screening for lipoid disorders: Secondary | ICD-10-CM

## 2020-04-06 DIAGNOSIS — J449 Chronic obstructive pulmonary disease, unspecified: Secondary | ICD-10-CM

## 2020-04-06 DIAGNOSIS — I2781 Cor pulmonale (chronic): Secondary | ICD-10-CM

## 2020-04-06 DIAGNOSIS — I700 Atherosclerosis of aorta: Secondary | ICD-10-CM

## 2020-04-06 DIAGNOSIS — Z23 Encounter for immunization: Secondary | ICD-10-CM | POA: Diagnosis not present

## 2020-04-06 DIAGNOSIS — I5032 Chronic diastolic (congestive) heart failure: Secondary | ICD-10-CM

## 2020-04-06 HISTORY — DX: Atherosclerosis of aorta: I70.0

## 2020-04-06 NOTE — Progress Notes (Signed)
Cardiology Office Note   Date:  04/06/2020   ID:  Anthony Nelson, DOB 07-19-1960, MRN [id number]  PCP:  [name], MD  Cardiologist:   Skeet Latch, MD   No chief complaint on file.   History of Present Illness: Anthony Nelson is a 59 y.o. male with COPD, severe pulmonary hypertension (WHO group 3), aortic atherosclerosis, and prior tobacco abuse here for follow-up.  He was initially seen in 2019 for the evaluation of pulmonary hypertension.  Anthony Nelson was admitted 01/2018 with a COPD exacerbation. This was a new diagnosis for him.  Anthony Nelson has a longstanding history of tobacco abuse.  When he came to the ED he had progressive dyspnea.  He also had lower extremity edema but no orthopnea or PND.  He was hypoxic to 73% on room air and improved to >98% on 2L.  Chest CT was negative for PE but suggested elevated R heart disease.  It also showed moderate emphysema.  Echo that admission revealed LVEF 60-65% with grade 1 diastolic dysfunction, moderately dilated RV with moderate-severely reduced systolic function.  PASP was 80 mmHg.  Labs were notable for mildly elevated LFTs.  BNP was 825.  He was treated with IV steroids and inhalers.  He was also diuresed but continued to have an oxygen requirement at discharge.  Since that time he has been able to come off oxygen and maintains sats >88% at rest and with exertion.  He was to follow up with cardioloy in 2 months and hasn't been seen.  Anthony Nelson was admitted 03/2020 with hypoxic respiratory failure.  EMS found him with an oxygen saturation of 45% on room air.  He was placed on a nonrebreather and then BiPAP.  He was admitted to the ICU and was able to be titrated back down to 3 L nasal cannula.  Echo during that hospitalization revealed LVEF 60 to 65% with normal diastolic function.  He had severe right ventricular failure.  PASP was 45 mmHg.  Since being discharged she has been doing much better.  He feels like his breathing is back to baseline  and he is gaining weight.  He stopped smoking by going cold [place].  He still struggles with wearing the BiPAP.  The first time he wore it is when he woke up in the ICU and this was very traumatic for him.  He is trying to wear it for longer and longer periods of time at home.  He has had very mild edema denies any orthopnea or PND.  He has no chest pain or pressure.  He has been trying to walk for about 10 minutes at a time.   Past Medical History:  Diagnosis Date  . Atherosclerosis of aorta ([place]) 04/06/2020  . Bronchitis   . COPD (chronic obstructive pulmonary disease) ([place])   . Diastolic heart failure ([place])   . GERD (gastroesophageal reflux disease)   . Pulmonary hypertension ([place])   . 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 Respiratory failure with hypoxia [facility])     Past Surgical History:  Procedure Laterality Date  . HERNIA REPAIR       Current Outpatient Medications  Medication Sig Dispense Refill  . albuterol (PROVENTIL) (2.5 MG/3ML) 0.083% nebulizer solution Take 3 mLs (2.5 mg total) by nebulization every 6 (six) hours as needed for wheezing or shortness of breath. 75 mL 0  . albuterol (VENTOLIN HFA) 108 (90 Base) MCG/ACT inhaler Inhale 2 puffs into the lungs  every 6 (six) hours as needed for wheezing or shortness of breath. 18 g 0  . Ascorbic Acid (VITAMIN C) 100 MG tablet Take 100 mg by mouth daily.    . budesonide-formoterol (SYMBICORT) 160-4.5 MCG/ACT inhaler Inhale 2 puffs into the lungs 2 (two) times daily. 1 Inhaler 1  . Cholecalciferol (VITAMIN D3) 50 MCG (2000 UT) TABS Take 2,000 Units by mouth daily.    . Cyanocobalamin (B-12 PO) Take by mouth.    . docusate sodium (COLACE) 100 MG capsule Take 1 capsule (100 mg total) by mouth daily. 10 capsule 0  . famotidine (PEPCID) 20 MG tablet Take 20 mg by mouth 2 (two) times daily.    . furosemide (LASIX) 20 MG tablet Take 1 tablet (20 mg total) by mouth daily. 30 tablet 1  .  Multiple Vitamins-Minerals (ZINC PO) Take by mouth.    . OXYGEN Inhale 2 L/min into the lungs continuous.     . potassium chloride SA (KLOR-CON) 20 MEQ tablet Take 1 tablet (20 mEq total) by mouth daily. 30 tablet 3  . umeclidinium bromide (INCRUSE ELLIPTA) 62.5 MCG/INH AEPB Inhale 1 puff into the lungs daily. 1 each 11   No current facility-administered medications for this visit.    Allergies:   Erythromycin    Social History:  The patient  reports that he has been smoking cigarettes. He has a 4.40 pack-year smoking history. He has never used smokeless tobacco. He reports previous alcohol use. He reports that he does not use drugs.   Family History:  The patient's family history includes CAD in his father; Cancer in his father and mother; Diabetes in his maternal grandmother; Heart attack in his paternal grandfather; Heart failure in his father; Kidney cancer in his paternal grandmother; Kidney disease in his maternal grandmother.    ROS:  Please see the history of present illness.   Otherwise, review of systems are positive for none.   All other systems are reviewed and negative.    PHYSICAL EXAM: VS:  BP 110/68   Pulse 90   Ht 5' 11.5" (1.816 m)   Wt 122 lb 6.4 oz (55.5 kg)   SpO2 98%   BMI 16.83 kg/m  , BMI Body mass index is 16.83 kg/m. GENERAL:  Well appearing HEENT:  Pupils equal round and reactive, fundi not visualized, oral mucosa unremarkable NECK:  No jugular venous distention, waveform within normal limits, carotid upstroke brisk and symmetric, no bruits  LUNGS:  Clear to auscultation bilaterally HEART:  RRR.  PMI not displaced or sustained,S1 and S2 within normal limits, no S3, no S4, no clicks, no rubs, no  murmurs ABD:  Flat, positive bowel sounds normal in frequency in pitch, no bruits, no rebound, no guarding, no midline pulsatile mass, no hepatomegaly, no splenomegaly EXT:  2 plus pulses throughout, no edema, no cyanosis no clubbing SKIN:  No rashes no  nodules NEURO:  Cranial nerves II through XII grossly intact, motor grossly intact throughout PSYCH:  Cognitively intact, oriented to person place and time   EKG:  EKG is not ordered today. The ekg ordered today demonstrates sinus rhythm.  Rate 99 bpm.  Right atrial enlargement.  Anterior T wave and normalities.  Echo 03/09/2020: IMPRESSIONS  1. Severe RV failure.  2. Left ventricular ejection fraction, by estimation, is 60 to 65%. The  left ventricle has normal function. The left ventricle has no regional  wall motion abnormalities. Left ventricular diastolic parameters were  normal.  3. Right ventricular systolic function  is severely reduced. The right  ventricular size is severely enlarged. There is moderately elevated  pulmonary artery systolic pressure.  4. The mitral valve is normal in structure. Trivial mitral valve  regurgitation. No evidence of mitral stenosis.  5. Tricuspid valve regurgitation is mild to moderate.  6. The aortic valve is tricuspid. Aortic valve regurgitation is not  visualized. Mild aortic valve sclerosis is present, with no evidence of  aortic valve stenosis.  7. The inferior vena cava is dilated in size with <50% respiratory  variability, suggesting right atrial pressure of 15 mmHg.   Recent Labs: 04/25/2019: TSH 1.757 03/08/2020: ALT 134; B Natriuretic Peptide 1,235.5 03/25/2020: BUN 11; Creatinine, Ser 0.58; Hemoglobin 13.5; Magnesium 1.8; Platelets 175; Potassium 4.3; Sodium 140    Lipid Panel No results found for: CHOL, TRIG, HDL, CHOLHDL, VLDL, LDLCALC, LDLDIRECT    Wt Readings from Last 3 Encounters:  04/06/20 122 lb 6.4 oz (55.5 kg)  03/25/20 113 lb 9.6 oz (51.5 kg)  03/14/20 119 lb 14.9 oz (54.4 kg)      ASSESSMENT AND PLAN:  # Severe pulmonary hypertension, WHO class 3: # Tobacco abuse: PASP was 80 mmHg in 2019 and 45 mmHg 03/2020.  This likely represents progressive RV failure rather than improvement.  He was congratulated on  smoking cessation.  We discussed the importance of using his BiPAP even though it is difficult to tolerate.  He is interested in increasing his exercise. We will look into pulmonary rehab referral.  His volume status today is pretty good.  Discussed limiting fluids to 2L and sodium to 1547m. He struggles with ED, but he understands that he is not a good candidate for PDE-5 inhibitors. Pulm rehab  # Aorta atherosclerosis: Asymptomatic.  He will check fasting lipids/CMP.  LDL goal <70.  # ED: Not a candidate for PDE-5 inhibitors.  He will follow up with his PCP/urology.  Current medicines are reviewed at length with the patient today.  The patient does not have concerns regarding medicines.  The following changes have been made:  no change  Labs/ tests ordered today include:   Orders Placed This Encounter  Procedures  . Lipid panel  . Comprehensive metabolic panel     Disposition:   FU with Lexxie Winberg C. ROval Linsey MD, FCole[facility] Centerin 6 months.     Signed, Odies Desa C. ROval Linsey MD, FSol[facility] Hospital [facility] 04/06/2020 5:46 PM    [facility]

## 2020-04-06 NOTE — Patient Instructions (Addendum)
Medication Instructions:  Your physician recommends that you continue on your current medications as directed. Please refer to the Current Medication list given to you today.  *If you need a refill on your cardiac medications before your next appointment, please call your pharmacy*  Lab Work: LP/CMET TODAY   If you have labs (blood work) drawn today and your tests are completely normal, you will receive your results only by: Marland Kitchen MyChart Message (if you have MyChart) OR . A paper copy in the mail If you have any lab test that is abnormal or we need to change your treatment, we will call you to review the results.  Testing/Procedures: NONE  Follow-Up: At [facility], you and your health needs are our priority.  As part of our continuing mission to provide you with exceptional heart care, we have created designated Provider Care Teams.  These Care Teams include your primary Cardiologist (physician) and Advanced Practice Providers (APPs -  Physician Assistants and Nurse Practitioners) who all work together to provide you with the care you need, when you need it.  We recommend signing up for the patient portal called "MyChart".  Sign up information is provided on this After Visit Summary.  MyChart is used to connect with patients for Virtual Visits (Telemedicine).  Patients are able to view lab/test results, encounter notes, upcoming appointments, etc.  Non-urgent messages can be sent to your provider as well.   To learn more about what you can do with MyChart, go to [email or web address].    Your next appointment:   6 month(s)  You will receive a reminder letter in the mail two months in advance. If you don't receive a letter, please call our office to schedule the follow-up appointment.   The format for your next appointment:   In Person  Provider:   You may see Skeet Latch, MD or one of the following Advanced Practice Providers on your designated Care Team:    Kerin Ransom,  PA-C  [place], [place]  Coletta Memos, [place]

## 2020-04-07 LAB — COMPREHENSIVE METABOLIC PANEL
ALT: 56 IU/L — ABNORMAL HIGH (ref 0–44)
AST: 29 IU/L (ref 0–40)
Albumin/Globulin Ratio: 1.5 (ref 1.2–2.2)
Albumin: 4.1 g/dL (ref 3.8–4.9)
Alkaline Phosphatase: 96 IU/L (ref 44–121)
BUN/Creatinine Ratio: 20 (ref 10–24)
BUN: 15 mg/dL (ref 8–27)
Bilirubin Total: 0.3 mg/dL (ref 0.0–1.2)
CO2: 33 mmol/L — ABNORMAL HIGH (ref 20–29)
Calcium: 9.1 mg/dL (ref 8.6–10.2)
Chloride: 96 mmol/L (ref 96–106)
Creatinine, Ser: 0.74 mg/dL — ABNORMAL LOW (ref 0.76–1.27)
GFR calc Af Amer: 116 mL/min/{1.73_m2} (ref >59)
GFR calc non Af Amer: 100 mL/min/{1.73_m2} (ref >59)
Globulin, Total: 2.7 g/dL (ref 1.5–4.5)
Glucose: 74 mg/dL (ref 65–99)
Potassium: 4.4 mmol/L (ref 3.5–5.2)
Sodium: 142 mmol/L (ref 134–144)
Total Protein: 6.8 g/dL (ref 6.0–8.5)

## 2020-04-07 LAB — LIPID PANEL
Chol/HDL Ratio: 2.1 ratio (ref 0.0–5.0)
Cholesterol, Total: 238 mg/dL — ABNORMAL HIGH (ref 100–199)
HDL: 112 mg/dL (ref >39)
LDL Chol Calc (NIH): 114 mg/dL — ABNORMAL HIGH (ref 0–99)
Triglycerides: 74 mg/dL (ref 0–149)
VLDL Cholesterol Cal: 12 mg/dL (ref 5–40)

## 2020-04-14 ENCOUNTER — Other Ambulatory Visit: Payer: Self-pay

## 2020-04-14 DIAGNOSIS — Z13220 Encounter for screening for lipoid disorders: Secondary | ICD-10-CM

## 2020-04-14 DIAGNOSIS — Z5181 Encounter for therapeutic drug level monitoring: Secondary | ICD-10-CM

## 2020-04-14 DIAGNOSIS — I700 Atherosclerosis of aorta: Secondary | ICD-10-CM

## 2020-04-14 MED ORDER — ROSUVASTATIN CALCIUM 20 MG PO TABS: 20.0000 mg | ORAL_TABLET | Freq: Every day | ORAL | 4 refills | Status: DC

## 2020-04-14 MED FILL — ROSUVASTATIN CALCIUM 20 MG: 20 | 30 days supply | Qty: 30 | Fill #0

## 2020-04-22 ENCOUNTER — Encounter: Payer: Self-pay | Admitting: Family Medicine

## 2020-04-22 ENCOUNTER — Telehealth: Payer: Self-pay | Admitting: Family Medicine

## 2020-04-22 NOTE — Telephone Encounter (Signed)
Please notify patient that the letter has been printed and is available for pickup.  Please provide patient with 2 copies of the letter as he may need to take 1 letter to his power company to be placed on a priority list in the event of power disruption/loss of electricity due to his use of an oxygen concentrator and BiPAP machine.

## 2020-04-22 NOTE — Telephone Encounter (Signed)
Copied from [place] [phone]. Topic: General - Inquiry >> Apr 22, 2020  1:06 PM Gillis Ends D wrote: Reason for CRM: Patient states he needs a letter from the doctor stating that he is currently on oxygen and a Bi-pap machine. He needs the letter to turn in so he can get some assistance with paying his light bill. Patient can be reached at [phone]. Please advise

## 2020-05-03 MED FILL — ALBUTEROL SULFATE HFA 108 (: 108 (90 BAS | 25 days supply | Qty: 18 | Fill #1

## 2020-05-03 MED FILL — ?FAMOTIDINE 20MG TABLET: 20 | 30 days supply | Qty: 60 | Fill #2

## 2020-05-03 MED FILL — FUROSEMIDE 20 MG TABS: 20 | 30 days supply | Qty: 30 | Fill #1

## 2020-05-03 MED FILL — POTASSIUM CL ER 20 MEQ TAB: 20 | 30 days supply | Qty: 30 | Fill #1

## 2020-05-04 DIAGNOSIS — Z23 Encounter for immunization: Secondary | ICD-10-CM | POA: Diagnosis not present

## 2020-05-09 NOTE — Progress Notes (Signed)
Subjective:    Patient ID: Anthony Nelson, male    DOB: 1961/05/10, 59 y.o.   MRN: [id number]   06/09/2019 This is a 59 year old male with gold stage D COPD on home oxygen.  The patient's been followed over time by the pulmonary clinic with Dr. Melvyn Novas.  His last visit was in 2019.  The patient is still actively smoking at this time.  The patient has been on a variety of inhalers but currently takes Incruse daily and Symbicort 160  twoinhalations twice daily.  Patient states overall his level of dyspnea is stable he states he has minimal cough.  He is not had recent exacerbations.  He does state when he ran out of his inhalers and was under a lot of stress he started smoking again his breathing worsened.  He is trying to go back to the nicotine patches and focusing on smoking cessation.  He does not have excess secretions at this time.  Pulmonary functions done in 2019 revealed FEV1 of less than 30% predicted consistent with gold stage D COPD  Note the patient also has pulmonary hypertension and cor pulmonale on the basis of the patient's COPD  The patient is on oxygen 2 L continuous at home   06/24/2019 This is the first in office visit as the last visit was a telephone visit.  This patient has gold stage IV COPD still actively smoking.  He has had issues with access to some of the inhalers however we were able to achieve at the last telephone visit patient assistance for Symbicort 160 and Incruse and is on both these medicines at this time.  He also maintains oxygen 2 L continuous.  Note he had been hospitalized in October.  During the October admission he had significant fluid retention and evidence of pulmonary hypertension with left ventricular hypertrophy  Today he notes he has increased weight and increased swelling in the feet and has not been using his furosemide on a regular basis.  He is under a great deal stress because in April of this year his son was apparently murdered and he and his  wife both of been in significant depression over this  He comes in today with oxygen saturation of 82% walking from the lobby into the exam room on 2 L  He is still smoking 3 to 4 cigarettes daily Review shortness of breath assessment below  08/11/2019  This patient returns today in follow-up for advanced COPD has continued to lose weight is down to 115 pounds with a BMI of 15.6  He has early satiety and has increased gas retention and aerophagia.  He maintains Symbicort at 2 inhalations twice daily and Incruse 1 puff a day  The patient also is using furosemide for chronic diastolic heart failure and does maintain an increased level of oxygen from 2 L rest to 4 L exertion  With the use of nicotine replacement therapy the patient is down to 4 cigarettes daily he does not drink alcohol  The mucus he is producing is clear with a less productive cough noted  The patient still has some element of wheezing  11/12/2019 Patient is seen in return follow-up for chronic obstructive lung disease and unexplained weight loss. Patient states his level of dyspnea is at baseline.  He is maintaining Symbicort twice daily and Incruse daily.  He does maintain furosemide for diastolic heart failure and oxygen 2 L rest for exertion.  He does have stage IV COPD based on previous spirometry.  Patient still smoking 3 cigarettes daily.  He is applying for disability remains anxious over this.  Wt Readings from Last 3 Encounters: 11/12/19 : 116 lb (52.6 kg) 08/11/19 : 115 lb (52.2 kg) 06/24/19 : 121 lb (54.9 kg)  Note at the last visit he had an elevated CEA level and we ordered a CT chest and abdomen that this is yet to be completed  11/9 This patient is seen return follow-up for Gold's stage D COPD with continued smoking however now the patient is no longer smoking at all and of congratulated him on this.  He still has dyspnea on exertion still has minimal mucus production maintains oxygen 2 L also has CPAP at  night see shortness of breath assessment below  The patient's weight loss work-up was unrevealing and he is actually gained 13 pounds since the last visit.  He is using Ensure supplements. Wt Readings from Last 3 Encounters: 05/10/20 : 129 lb 3.2 oz (58.6 kg) 04/06/20 : 122 lb 6.4 oz (55.5 kg) 03/25/20 : 113 lb 9.6 oz (51.5 kg)   Shortness of Breath This is a chronic problem. The current episode started more than 1 year ago. The problem occurs daily. The problem has been unchanged. Associated symptoms include sputum production. Pertinent negatives include no chest pain, claudication, fever, headaches, hemoptysis, leg swelling, PND, rhinorrhea, sore throat or wheezing. The symptoms are aggravated by any activity and weather changes. Associated symptoms comments: Mucus is clr. Risk factors include smoking. He has tried beta agonist inhalers, ipratropium inhalers and steroid inhalers for the symptoms. The treatment provided significant relief. His past medical history is significant for COPD.    Past Medical History:  Diagnosis Date  . Atherosclerosis of aorta ([place]) 04/06/2020  . Bronchitis   . COPD (chronic obstructive pulmonary disease) ([place])   . Diastolic heart failure ([place])   . GERD (gastroesophageal reflux disease)   . Pulmonary hypertension ([place])   . 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 ([place]) 04/10/2018  . Respiratory failure with hypoxia (HCC)      Family History  Problem Relation Age of Onset  . Cancer Mother   . Cancer Father   . CAD Father   . Heart failure Father   . Diabetes Maternal Grandmother   . Kidney disease Maternal Grandmother   . Kidney cancer Paternal Grandmother   . Heart attack Paternal Grandfather      Social History   Socioeconomic History  . Marital status: Married    Spouse name: Anthony Nelson  . Number of children: 3  . Years of education: 16  . Highest education level: Bachelor's degree (e.g., BA, AB, BS)   Occupational History  . Occupation: Presenter, broadcasting  Tobacco Use  . Smoking status: Current Every Day Smoker    Packs/day: 0.10    Years: 44.00    Pack years: 4.40    Types: Cigarettes  . Smokeless tobacco: Never Used  Vaping Use  . Vaping Use: Never used  Substance and Sexual Activity  . Alcohol use: Not Currently    Comment: Socially   . Drug use: No  . Sexual activity: Not Currently  Other Topics Concern  . Not on file  Social History Narrative  . Not on file   Social Determinants of Health   Financial Resource Strain:   . Difficulty of Paying Living Expenses: Not on file  Food Insecurity:   . Worried About Charity fundraiser in the Last Year: Not on file  .  Ran Out of Food in the Last Year: Not on file  Transportation Needs:   . Lack of Transportation (Medical): Not on file  . Lack of Transportation (Non-Medical): Not on file  Physical Activity:   . Days of Exercise per Week: Not on file  . Minutes of Exercise per Session: Not on file  Stress:   . Feeling of Stress : Not on file  Social Connections:   . Frequency of Communication with Friends and Family: Not on file  . Frequency of Social Gatherings with Friends and Family: Not on file  . Attends Religious Services: Not on file  . Active Member of Clubs or Organizations: Not on file  . Attends Archivist Meetings: Not on file  . Marital Status: Not on file  Intimate Partner Violence:   . Fear of Current or Ex-Partner: Not on file  . Emotionally Abused: Not on file  . Physically Abused: Not on file  . Sexually Abused: Not on file     Allergies  Allergen Reactions  . Erythromycin Other (See Comments)    Allergy from childhood- does not take it     Outpatient Medications Prior to Visit  Medication Sig Dispense Refill  . Ascorbic Acid (VITAMIN C) 100 MG tablet Take 100 mg by mouth daily.    . Cholecalciferol (VITAMIN D3) 50 MCG (2000 UT) TABS Take 2,000 Units by mouth daily.    . Cyanocobalamin  (B-12 PO) Take by mouth.    . docusate sodium (COLACE) 100 MG capsule Take 1 capsule (100 mg total) by mouth daily. 10 capsule 0  . famotidine (PEPCID) 20 MG tablet Take 20 mg by mouth 2 (two) times daily.    . furosemide (LASIX) 20 MG tablet Take 1 tablet (20 mg total) by mouth daily. 30 tablet 1  . Multiple Vitamins-Minerals (ZINC PO) Take by mouth.    . OXYGEN Inhale 2 L/min into the lungs continuous.     . potassium chloride SA (KLOR-CON) 20 MEQ tablet Take 1 tablet (20 mEq total) by mouth daily. 30 tablet 3  . rosuvastatin (CRESTOR) 20 MG tablet Take 1 tablet (20 mg total) by mouth daily. 30 tablet 4  . albuterol (PROVENTIL) (2.5 MG/3ML) 0.083% nebulizer solution Take 3 mLs (2.5 mg total) by nebulization every 6 (six) hours as needed for wheezing or shortness of breath. 75 mL 0  . albuterol (VENTOLIN HFA) 108 (90 Base) MCG/ACT inhaler Inhale 2 puffs into the lungs every 6 (six) hours as needed for wheezing or shortness of breath. 18 g 0  . budesonide-formoterol (SYMBICORT) 160-4.5 MCG/ACT inhaler Inhale 2 puffs into the lungs 2 (two) times daily. 1 Inhaler 1  . umeclidinium bromide (INCRUSE ELLIPTA) 62.5 MCG/INH AEPB Inhale 1 puff into the lungs daily. 1 each 11   No facility-administered medications prior to visit.     Review of Systems  Constitutional: Negative for fever and unexpected weight change.  HENT: Negative for rhinorrhea and sore throat.   Respiratory: Positive for sputum production and shortness of breath. Negative for cough, hemoptysis, chest tightness and wheezing.   Cardiovascular: Negative for chest pain, claudication, leg swelling and PND.  Gastrointestinal: Negative.   Genitourinary: Negative.   Musculoskeletal: Negative.   Neurological: Negative for headaches.  Psychiatric/Behavioral: Negative.        Objective:   Physical Exam    Vitals:   05/10/20 1033  BP: 117/78  Pulse: (!) 105  SpO2: 91%  Weight: 129 lb 3.2 oz (58.6 kg)  Gen: Pleasant, thin, in  no distress,  normal affect  ENT: No lesions,  mouth clear,  oropharynx clear, no postnasal drip  Neck: No JVD, no TMG, no carotid bruits  Lungs: No use of accessory muscles, no dullness to percussion, distant breath sounds with hyperresonance to percussion, no wheezes noted  Cardiovascular: RRR, heart sounds normal, no murmur or gallops,no peripheral edema  Abdomen: soft and NT, no HSM,  BS normal  Musculoskeletal: No deformities, no cyanosis or clubbing  Neuro: alert, non focal  Skin: Warm, no lesions or rashes   Assessment & Plan:  I personally reviewed all images and lab data in the [facility] system as well as any outside material available during this office visit and agree with the  radiology impressions.   Chronic diastolic heart failure (HCC) Chronic diastolic heart failure stable at this time no change in medicines  Chronic respiratory failure with hypoxia (HCC) Chronic respiratory failure with hypoxemia continue 2 L oxygen will obtain new nasal cannula for the patient  COPD GOLD IV  Cold COPD stable at this time continue inhaled medications refills on Symbicort and Incruse given  Cigarette smoker Patient's actively has quit smoking and I congratulated him  Weight loss Weight has stabilized no longer losing weight continue Ensure supplements   Jedaiah was seen today for follow-up.  Diagnoses and all orders for this visit:  Colon cancer screening -     Fecal occult blood, imunochemical  Chronic obstructive pulmonary disease, unspecified COPD type (HCC) -     umeclidinium bromide (INCRUSE ELLIPTA) 62.5 MCG/INH AEPB; Inhale 1 puff into the lungs daily. -     albuterol (VENTOLIN HFA) 108 (90 Base) MCG/ACT inhaler; Inhale 2 puffs into the lungs every 6 (six) hours as needed for wheezing or shortness of breath. -     For home use only DME Other see comment  Chronic respiratory failure with hypoxia ([place]) -     For home use only DME Other see comment  Chronic diastolic  heart failure ([place])  COPD GOLD IV still smoking  Cigarette smoker  Weight loss  Other orders -     budesonide-formoterol (SYMBICORT) 160-4.5 MCG/ACT inhaler; Inhale 2 puffs into the lungs 2 (two) times daily. -     albuterol (PROVENTIL) (2.5 MG/3ML) 0.083% nebulizer solution; Take 3 mLs (2.5 mg total) by nebulization every 6 (six) hours as needed for wheezing or shortness of breath.   Will obtain fecal occult study for colon cancer screening

## 2020-05-10 ENCOUNTER — Ambulatory Visit: Payer: Self-pay | Attending: Critical Care Medicine | Admitting: Critical Care Medicine

## 2020-05-10 ENCOUNTER — Ambulatory Visit (HOSPITAL_BASED_OUTPATIENT_CLINIC_OR_DEPARTMENT_OTHER): Payer: Medicaid Other | Admitting: Pharmacist

## 2020-05-10 ENCOUNTER — Other Ambulatory Visit: Payer: Self-pay | Admitting: Critical Care Medicine

## 2020-05-10 ENCOUNTER — Encounter: Payer: Self-pay | Admitting: Critical Care Medicine

## 2020-05-10 ENCOUNTER — Other Ambulatory Visit: Payer: Self-pay

## 2020-05-10 VITALS — BP 117/78 | HR 105 | Wt 129.2 lb

## 2020-05-10 DIAGNOSIS — F17210 Nicotine dependence, cigarettes, uncomplicated: Secondary | ICD-10-CM

## 2020-05-10 DIAGNOSIS — J9611 Chronic respiratory failure with hypoxia: Secondary | ICD-10-CM

## 2020-05-10 DIAGNOSIS — Z1211 Encounter for screening for malignant neoplasm of colon: Secondary | ICD-10-CM

## 2020-05-10 DIAGNOSIS — Z23 Encounter for immunization: Secondary | ICD-10-CM

## 2020-05-10 DIAGNOSIS — R634 Abnormal weight loss: Secondary | ICD-10-CM

## 2020-05-10 DIAGNOSIS — J449 Chronic obstructive pulmonary disease, unspecified: Secondary | ICD-10-CM

## 2020-05-10 DIAGNOSIS — I5032 Chronic diastolic (congestive) heart failure: Secondary | ICD-10-CM

## 2020-05-10 MED ORDER — ALBUTEROL SULFATE HFA 108 (90 BASE) MCG/ACT IN AERS: 2.0000 | INHALATION_SPRAY | Freq: Four times a day (QID) | RESPIRATORY_TRACT | 0 refills | Status: DC | PRN

## 2020-05-10 MED ORDER — INCRUSE ELLIPTA 62.5 MCG/INH IN AEPB: 1.0000 | INHALATION_SPRAY | Freq: Every day | RESPIRATORY_TRACT | 11 refills | Status: DC

## 2020-05-10 MED ORDER — BUDESONIDE-FORMOTEROL FUMARATE 160-4.5 MCG/ACT IN AERO
2.0000 | INHALATION_SPRAY | Freq: Two times a day (BID) | RESPIRATORY_TRACT | 6 refills | Status: DC
Start: 2020-05-10 — End: 2020-10-04

## 2020-05-10 MED ORDER — ALBUTEROL SULFATE (2.5 MG/3ML) 0.083% IN NEBU: 2.5000 mg | INHALATION_SOLUTION | Freq: Four times a day (QID) | RESPIRATORY_TRACT | 0 refills | Status: DC | PRN

## 2020-05-10 MED FILL — ALBUTEROL 0.083% INHAL SOLN: (2.5 MG/3ML | 6 days supply | Qty: 75 | Fill #0

## 2020-05-10 MED FILL — ROSUVASTATIN CALCIUM 20 MG: 20 | 30 days supply | Qty: 30 | Fill #1

## 2020-05-10 NOTE — Progress Notes (Signed)
Patient presents for vaccination against zoster per orders of Dr. Chapman Fitch. Consent given. Counseling provided. No contraindications exists. Vaccine administered without incident.   Benard Halsted, PharmD, [place] [phone]

## 2020-05-10 NOTE — Assessment & Plan Note (Signed)
Cold COPD stable at this time continue inhaled medications refills on Symbicort and Incruse given

## 2020-05-10 NOTE — Assessment & Plan Note (Signed)
Patient's actively has quit smoking and I congratulated him

## 2020-05-10 NOTE — Patient Instructions (Addendum)
Refills on all your medications were sent to our pharmacy  Pick up a fecal occult kit at our lab before you leave and send in with a sample so we can screen you for colon cancer  We will obtain for you an order for more nasal cannula that she can pick up today at the homecare company we will fax the order over today  No change in oxygen or CPAP prescription  A shingles vaccine first one of the series was given today  Ensure coupons were given  We will fax your information to the disability office  Return to see Dr. [name] 2 months

## 2020-05-10 NOTE — Progress Notes (Signed)
Needs refill on Rosuvastatin  Ensure coupons Needs new oxygen tubing

## 2020-05-10 NOTE — Assessment & Plan Note (Signed)
Weight has stabilized no longer losing weight continue Ensure supplements

## 2020-05-10 NOTE — Assessment & Plan Note (Signed)
Chronic respiratory failure with hypoxemia continue 2 L oxygen will obtain new nasal cannula for the patient

## 2020-05-10 NOTE — Assessment & Plan Note (Signed)
Chronic diastolic heart failure stable at this time no change in medicines

## 2020-06-09 MED FILL — ROSUVASTATIN CALCIUM 20 MG: 20 | 30 days supply | Qty: 30 | Fill #2

## 2020-06-09 MED FILL — POTASSIUM CL ER 20 MEQ TAB: 20 | 30 days supply | Qty: 30 | Fill #2

## 2020-06-09 MED FILL — ALBUTEROL SULFATE HFA 108 (: 108 (90 BAS | 25 days supply | Qty: 18 | Fill #0

## 2020-06-15 MED FILL — FUROSEMIDE 20 MG TABS: 20 | 30 days supply | Qty: 30 | Fill #1

## 2020-07-11 ENCOUNTER — Ambulatory Visit: Payer: Medicaid Other | Admitting: Critical Care Medicine

## 2020-07-27 ENCOUNTER — Other Ambulatory Visit: Payer: Self-pay | Admitting: Critical Care Medicine

## 2020-07-27 DIAGNOSIS — I5032 Chronic diastolic (congestive) heart failure: Secondary | ICD-10-CM

## 2020-07-27 MED FILL — ROSUVASTATIN CALCIUM 20 MG: 20 | 30 days supply | Qty: 30 | Fill #3

## 2020-07-27 NOTE — Telephone Encounter (Signed)
Requested medication (s) are due for refill today: Yes  Requested medication (s) are on the active medication list: Yes  Last refill:  03/2020 and 12/23/0  Future visit scheduled: Yes  Notes to clinic:  Unable to refill per protocol, expired Rx, last refill by historical provider     Requested Prescriptions  Pending Prescriptions Disp Refills   famotidine (PEPCID) 20 MG tablet [Pharmacy Med Name: FAMOTIDINE 20MG TABLET 20 Tablet] 60 tablet 3    Sig: TAKE 1 TABLET (20 MG TOTAL) BY MOUTH 2 (TWO) TIMES DAILY.      Gastroenterology:  H2 Antagonists Passed - 07/27/2020  4:21 PM      Passed - Valid encounter within last 12 months    Recent Outpatient Visits           2 months ago Need for vaccination for zoster   [place], Jarome Matin, RPH-CPP   2 months ago Chronic obstructive pulmonary disease, unspecified COPD type [facility])   [place] Elsie Stain, MD   4 months ago Hospital discharge follow-up   [place], MD   8 months ago COPD GOLD IV still smoking   [place] Elsie Stain, MD   11 months ago COPD exacerbation [facility])   [place], MD       Future Appointments             In 1 month Elsie Stain, MD [place]               potassium chloride SA (KLOR-CON) 20 MEQ tablet [Pharmacy Med Name: POTASSIUM CL ER 20 MEQ TAB 20 Tablet] 30 tablet 3    Sig: Take 1 tablet (20 mEq total) by mouth daily.      Endocrinology:  Minerals - Potassium Supplementation Failed - 07/27/2020  4:21 PM      Failed - Cr in normal range and within 360 days    Creatinine, Ser  Date Value Ref Range Status  04/06/2020 0.74 (L) 0.76 - 1.27 mg/dL Final          Passed - K in normal range and within 360 days    Potassium  Date Value Ref Range Status   04/06/2020 4.4 3.5 - 5.2 mmol/L Final          Passed - Valid encounter within last 12 months    Recent Outpatient Visits           2 months ago Need for vaccination for zoster   [place], Jarome Matin, RPH-CPP   2 months ago Chronic obstructive pulmonary disease, unspecified COPD type [facility])   [place] Elsie Stain, MD   4 months ago Hospital discharge follow-up   [place], MD   8 months ago COPD GOLD IV still smoking   [place] Elsie Stain, MD   11 months ago COPD exacerbation [facility])   [place], MD       Future Appointments             In 1 month [name] Burn[place] Harry, MD [place]

## 2020-07-29 ENCOUNTER — Telehealth: Payer: Self-pay | Admitting: Critical Care Medicine

## 2020-07-29 ENCOUNTER — Other Ambulatory Visit: Payer: Self-pay | Admitting: Critical Care Medicine

## 2020-07-29 DIAGNOSIS — J449 Chronic obstructive pulmonary disease, unspecified: Secondary | ICD-10-CM

## 2020-07-29 DIAGNOSIS — I5032 Chronic diastolic (congestive) heart failure: Secondary | ICD-10-CM

## 2020-07-29 MED ORDER — FUROSEMIDE 20 MG PO TABS: 20.0000 mg | ORAL_TABLET | Freq: Every day | ORAL | 1 refills | Status: DC

## 2020-07-29 MED ORDER — ALBUTEROL SULFATE HFA 108 (90 BASE) MCG/ACT IN AERS: 2.0000 | INHALATION_SPRAY | Freq: Four times a day (QID) | RESPIRATORY_TRACT | 2 refills | Status: DC | PRN

## 2020-07-29 MED ORDER — ALBUTEROL SULFATE (2.5 MG/3ML) 0.083% IN NEBU: 2.5000 mg | INHALATION_SOLUTION | Freq: Four times a day (QID) | RESPIRATORY_TRACT | 2 refills | Status: DC | PRN

## 2020-07-29 MED FILL — POTASSIUM CL ER 20 MEQ TAB: 20 | 30 days supply | Qty: 30 | Fill #0

## 2020-07-29 MED FILL — ?FAMOTIDINE 20MG TABLET: 20 | 30 days supply | Qty: 60 | Fill #0

## 2020-07-29 NOTE — Telephone Encounter (Signed)
Requested medication (s) are due for refill today: Yes  Requested medication (s) are on the active medication list: Yes  Last refill:  04/14/20  Future visit scheduled: Yes  Notes to clinic:  Unable to refill per protocol, last refilled by another provider.     Requested Prescriptions  Pending Prescriptions Disp Refills   rosuvastatin (CRESTOR) 20 MG tablet 30 tablet 4    Sig: Take 1 tablet (20 mg total) by mouth daily.      Cardiovascular:  Antilipid - Statins Failed - 07/29/2020  4:07 PM      Failed - Total Cholesterol in normal range and within 360 days    Cholesterol, Total  Date Value Ref Range Status  04/06/2020 238 (H) 100 - 199 mg/dL Final          Failed - LDL in normal range and within 360 days    LDL Chol Calc (NIH)  Date Value Ref Range Status  04/06/2020 114 (H) 0 - 99 mg/dL Final          Passed - HDL in normal range and within 360 days    HDL  Date Value Ref Range Status  04/06/2020 112 >39 mg/dL Final          Passed - Triglycerides in normal range and within 360 days    Triglycerides  Date Value Ref Range Status  04/06/2020 74 0 - 149 mg/dL Final          Passed - Patient is not pregnant      Passed - Valid encounter within last 12 months    Recent Outpatient Visits           2 months ago Need for vaccination for zoster   [place], Annie Main L, RPH-CPP   2 months ago Chronic obstructive pulmonary disease, unspecified COPD type [facility])   [place] Elsie Stain, MD   4 months ago Hospital discharge follow-up   [place], MD   8 months ago COPD GOLD IV still smoking   [place] Elsie Stain, MD   11 months ago COPD exacerbation [facility])   [place] Elsie Stain, MD       Future Appointments             In 1 month Elsie Stain, MD [place]              Signed Prescriptions Disp Refills   furosemide (LASIX) 20 MG tablet 90 tablet 1    Sig: Take 1 tablet (20 mg total) by mouth daily.      Cardiovascular:  Diuretics - Loop Failed - 07/29/2020  4:07 PM      Failed - Cr in normal range and within 360 days    Creatinine, Ser  Date Value Ref Range Status  04/06/2020 0.74 (L) 0.76 - 1.27 mg/dL Final          Passed - K in normal range and within 360 days    Potassium  Date Value Ref Range Status  04/06/2020 4.4 3.5 - 5.2 mmol/L Final          Passed - Ca in normal range and within 360 days    Calcium  Date Value Ref Range Status  04/06/2020 9.1 8.6 - 10.2 mg/dL Final   Calcium, Ion  Date  Value Ref Range Status  02/08/2018 0.96 (L) 1.15 - 1.40 mmol/L Final          Passed - Na in normal range and within 360 days    Sodium  Date Value Ref Range Status  04/06/2020 142 134 - 144 mmol/L Final          Passed - Last BP in normal range    BP Readings from Last 1 Encounters:  05/10/20 117/78          Passed - Valid encounter within last 6 months    Recent Outpatient Visits           2 months ago Need for vaccination for zoster   [place], Jarome Matin, RPH-CPP   2 months ago Chronic obstructive pulmonary disease, unspecified COPD type [facility])   [place] Elsie Stain, MD   4 months ago Hospital discharge follow-up   [place], MD   8 months ago COPD GOLD IV still smoking   [place] Elsie Stain, MD   11 months ago COPD exacerbation [facility])   [place] Elsie Stain, MD       Future Appointments             In 1 month Elsie Stain, MD [place]               albuterol (PROVENTIL) (2.5 MG/3ML) 0.083% nebulizer solution 75 mL 2     Sig: Take 3 mLs (2.5 mg total) by nebulization every 6 (six) hours as needed for wheezing or shortness of breath.      Pulmonology:  Beta Agonists Failed - 07/29/2020  4:07 PM      Failed - One inhaler should last at least one month. If the patient is requesting refills earlier, contact the patient to check for uncontrolled symptoms.      Passed - Valid encounter within last 12 months    Recent Outpatient Visits           2 months ago Need for vaccination for zoster   [place], Jarome Matin, RPH-CPP   2 months ago Chronic obstructive pulmonary disease, unspecified COPD type [facility])   [place] Elsie Stain, MD   4 months ago Hospital discharge follow-up   [place], MD   8 months ago COPD GOLD IV still smoking   [place] Elsie Stain, MD   11 months ago COPD exacerbation [facility])   [place] Elsie Stain, MD       Future Appointments             In 1 month Elsie Stain, MD [place]               albuterol (VENTOLIN HFA) 108 (90 Base) MCG/ACT inhaler 18 g 2    Sig: Inhale 2 puffs into the lungs every 6 (six) hours as needed for wheezing or shortness of breath.      Pulmonology:  Beta Agonists Failed - 07/29/2020  4:07 PM      Failed - One inhaler should last at least one month. If the patient is requesting refills earlier, contact the  patient to check for uncontrolled symptoms.      Passed - Valid encounter within last 12 months    Recent Outpatient Visits           2 months ago Need for vaccination for zoster   [place], Jarome Matin, RPH-CPP   2 months ago Chronic obstructive pulmonary disease, unspecified COPD type [facility])   [place] Elsie Stain, MD   4  months ago Hospital discharge follow-up   [place], MD   8 months ago COPD GOLD IV still smoking   [place] Elsie Stain, MD   11 months ago COPD exacerbation [facility])   [place] Elsie Stain, MD       Future Appointments             In 1 month [name] Burnett Harry, MD [place]              Refused Prescriptions Disp Refills   famotidine (PEPCID) 20 MG tablet 60 tablet 3      Gastroenterology:  H2 Antagonists Passed - 07/29/2020  4:07 PM      Passed - Valid encounter within last 12 months    Recent Outpatient Visits           2 months ago Need for vaccination for zoster   [place], Stephen L, RPH-CPP   2 months ago Chronic obstructive pulmonary disease, unspecified COPD type [facility])   [place] Elsie Stain, MD   4 months ago Hospital discharge follow-up   [place], MD   8 months ago COPD GOLD IV still smoking   [place] Elsie Stain, MD   11 months ago COPD exacerbation [facility])   [place] Elsie Stain, MD       Future Appointments             In 1 month Elsie Stain, MD [place]               potassium chloride SA (KLOR-CON) 20 MEQ tablet 30 tablet 3    Sig: Take 1 tablet (20 mEq total) by mouth daily.      Endocrinology:  Minerals - Potassium Supplementation Failed - 07/29/2020  4:07 PM      Failed - Cr in normal range and within 360 days    Creatinine, Ser  Date Value Ref Range Status  04/06/2020 0.74 (L) 0.76 - 1.27 mg/dL Final          Passed - K in normal range and within 360 days    Potassium  Date Value Ref Range Status  04/06/2020 4.4 3.5 - 5.2 mmol/L Final           Passed - Valid encounter within last 12 months    Recent Outpatient Visits           2 months ago Need for vaccination for zoster   [place], Jarome Matin, RPH-CPP   2 months ago Chronic obstructive pulmonary disease, unspecified COPD type [facility])   [facility] Elsie Stain, MD   4 months ago Hospital discharge  follow-up   [place], MD   8 months ago COPD GOLD IV still smoking   [place] Elsie Stain, MD   11 months ago COPD exacerbation [facility])   [place], MD       Future Appointments             In 1 month [name] Burnett Harry, MD [place]

## 2020-07-29 NOTE — Telephone Encounter (Signed)
Copied from [place] [id number]. Topic: Quick Communication - Rx Refill/Question >> Jul 29, 2020  3:28 PM Mcneil, Ja-Kwan wrote: Medication: furosemide (LASIX) 20 MG tablet, famotidine (PEPCID) 20 MG tablet, potassium chloride SA (KLOR-CON) 20 MEQ tablet, albuterol (VENTOLIN HFA) 108 (90 Base) MCG/ACT inhaler, albuterol (PROVENTIL) (2.5 MG/3ML) 0.083% nebulizer solution, and rosuvastatin (CRESTOR) 20 MG tablet   Has the patient contacted their pharmacy? yes - Pt stated automated system will not accept refill request  Preferred Pharmacy (with phone number or street name): [place], [place] [facility]  Phone: [phone]   Fax: [phone]  Agent: Please be advised that RX refills may take up to 3 business days. We ask that you follow-up with your pharmacy.

## 2020-07-29 NOTE — Telephone Encounter (Signed)
Copied from [place] [id number]. Topic: General - Other >> Jul 28, 2020 11:14 AM Anthony Nelson wrote: Pt need a callback to go over all his medications / please advise

## 2020-08-01 MED FILL — FUROSEMIDE 20 MG TABS: 20 | 30 days supply | Qty: 30 | Fill #0

## 2020-08-01 MED FILL — ALBUTEROL 0.083% INHAL SOLN: (2.5 MG/3ML | 6 days supply | Qty: 75 | Fill #0

## 2020-08-01 NOTE — Telephone Encounter (Signed)
Pt was called and informed that refills has been sent to pharmacy, he was informed that medication will be ready for pick up today.

## 2020-08-02 ENCOUNTER — Other Ambulatory Visit: Payer: Self-pay | Admitting: Critical Care Medicine

## 2020-08-02 MED ORDER — ROSUVASTATIN CALCIUM 20 MG PO TABS: 20.0000 mg | ORAL_TABLET | Freq: Every day | ORAL | 0 refills | Status: DC

## 2020-08-02 MED FILL — PROAIR HFA 90 MCG INHALER: 108 (90 BAS | 25 days supply | Qty: 9 | Fill #0

## 2020-08-10 MED FILL — PROAIR HFA 90 MCG INHALER: 108 (90 BAS | 25 days supply | Qty: 9 | Fill #0

## 2020-08-10 MED FILL — ALBUTEROL 0.083% INHAL SOLN: (2.5 MG/3ML | 6 days supply | Qty: 75 | Fill #0

## 2020-08-10 MED FILL — ROSUVASTATIN CALCIUM 20 MG: 20 | 30 days supply | Qty: 30 | Fill #3

## 2020-08-10 MED FILL — POTASSIUM CL ER 20 MEQ TAB: 20 | 30 days supply | Qty: 30 | Fill #0

## 2020-08-10 MED FILL — FUROSEMIDE 20 MG TABS: 20 | 30 days supply | Qty: 30 | Fill #0

## 2020-08-10 MED FILL — FAMOTIDINE 20 MG TABS: 20 | 30 days supply | Qty: 60 | Fill #0

## 2020-08-29 ENCOUNTER — Ambulatory Visit: Payer: Medicaid Other | Admitting: Critical Care Medicine

## 2020-09-02 ENCOUNTER — Inpatient Hospital Stay (HOSPITAL_COMMUNITY)
Admission: EM | Admit: 2020-09-02 | Discharge: 2020-09-07 | DRG: 190 | Disposition: A | Discharge status: Home / Self Care | Payer: Medicaid Other | Attending: Internal Medicine | Admitting: Internal Medicine

## 2020-09-02 ENCOUNTER — Other Ambulatory Visit: Payer: Self-pay

## 2020-09-02 DIAGNOSIS — Z9981 Dependence on supplemental oxygen: Secondary | ICD-10-CM

## 2020-09-02 DIAGNOSIS — J9622 Acute and chronic respiratory failure with hypercapnia: Secondary | ICD-10-CM | POA: Diagnosis present

## 2020-09-02 DIAGNOSIS — R410 Disorientation, unspecified: Secondary | ICD-10-CM | POA: Diagnosis not present

## 2020-09-02 DIAGNOSIS — K219 Gastro-esophageal reflux disease without esophagitis: Secondary | ICD-10-CM | POA: Diagnosis present

## 2020-09-02 DIAGNOSIS — J449 Chronic obstructive pulmonary disease, unspecified: Secondary | ICD-10-CM | POA: Diagnosis present

## 2020-09-02 DIAGNOSIS — J441 Chronic obstructive pulmonary disease with (acute) exacerbation: Secondary | ICD-10-CM | POA: Diagnosis not present

## 2020-09-02 DIAGNOSIS — Z20822 Contact with and (suspected) exposure to covid-19: Secondary | ICD-10-CM | POA: Diagnosis present

## 2020-09-02 DIAGNOSIS — D7589 Other specified diseases of blood and blood-forming organs: Secondary | ICD-10-CM | POA: Diagnosis present

## 2020-09-02 DIAGNOSIS — I5032 Chronic diastolic (congestive) heart failure: Secondary | ICD-10-CM | POA: Diagnosis present

## 2020-09-02 DIAGNOSIS — J9621 Acute and chronic respiratory failure with hypoxia: Secondary | ICD-10-CM | POA: Diagnosis present

## 2020-09-02 DIAGNOSIS — R0689 Other abnormalities of breathing: Secondary | ICD-10-CM | POA: Diagnosis not present

## 2020-09-02 DIAGNOSIS — I700 Atherosclerosis of aorta: Secondary | ICD-10-CM | POA: Diagnosis present

## 2020-09-02 DIAGNOSIS — E785 Hyperlipidemia, unspecified: Secondary | ICD-10-CM | POA: Diagnosis present

## 2020-09-02 DIAGNOSIS — G9341 Metabolic encephalopathy: Secondary | ICD-10-CM | POA: Diagnosis present

## 2020-09-02 DIAGNOSIS — R0902 Hypoxemia: Secondary | ICD-10-CM | POA: Diagnosis not present

## 2020-09-02 DIAGNOSIS — Z7982 Long term (current) use of aspirin: Secondary | ICD-10-CM

## 2020-09-02 DIAGNOSIS — D538 Other specified nutritional anemias: Secondary | ICD-10-CM | POA: Diagnosis present

## 2020-09-02 DIAGNOSIS — E872 Acidosis: Secondary | ICD-10-CM | POA: Diagnosis present

## 2020-09-02 DIAGNOSIS — Z7951 Long term (current) use of inhaled steroids: Secondary | ICD-10-CM

## 2020-09-02 DIAGNOSIS — R0602 Shortness of breath: Secondary | ICD-10-CM | POA: Diagnosis not present

## 2020-09-02 DIAGNOSIS — J9602 Acute respiratory failure with hypercapnia: Secondary | ICD-10-CM | POA: Diagnosis present

## 2020-09-02 DIAGNOSIS — R531 Weakness: Secondary | ICD-10-CM | POA: Diagnosis not present

## 2020-09-02 DIAGNOSIS — F17210 Nicotine dependence, cigarettes, uncomplicated: Secondary | ICD-10-CM | POA: Diagnosis present

## 2020-09-02 DIAGNOSIS — Z79899 Other long term (current) drug therapy: Secondary | ICD-10-CM

## 2020-09-02 DIAGNOSIS — I2723 Pulmonary hypertension due to lung diseases and hypoxia: Secondary | ICD-10-CM | POA: Diagnosis present

## 2020-09-02 DIAGNOSIS — I2781 Cor pulmonale (chronic): Secondary | ICD-10-CM | POA: Diagnosis present

## 2020-09-02 NOTE — ED Triage Notes (Signed)
Per [facility] pt coming from home reporting stroke likes/s and then reporting respiratory distress, lung sounds clear bilaterally with expiratory grunting, duo neb in route. 93% baseline, 149/91 cbg 153 pulse 93, rr16, normal sinus on 12 lead.

## 2020-09-03 ENCOUNTER — Emergency Department (HOSPITAL_COMMUNITY): Payer: Medicaid Other

## 2020-09-03 ENCOUNTER — Observation Stay (HOSPITAL_COMMUNITY): Payer: Medicaid Other

## 2020-09-03 ENCOUNTER — Encounter (HOSPITAL_COMMUNITY): Payer: Self-pay | Admitting: Internal Medicine

## 2020-09-03 DIAGNOSIS — I700 Atherosclerosis of aorta: Secondary | ICD-10-CM

## 2020-09-03 DIAGNOSIS — I5032 Chronic diastolic (congestive) heart failure: Secondary | ICD-10-CM | POA: Diagnosis not present

## 2020-09-03 DIAGNOSIS — I2781 Cor pulmonale (chronic): Secondary | ICD-10-CM | POA: Diagnosis present

## 2020-09-03 DIAGNOSIS — I2723 Pulmonary hypertension due to lung diseases and hypoxia: Secondary | ICD-10-CM | POA: Diagnosis not present

## 2020-09-03 DIAGNOSIS — J9621 Acute and chronic respiratory failure with hypoxia: Secondary | ICD-10-CM | POA: Diagnosis not present

## 2020-09-03 DIAGNOSIS — G9341 Metabolic encephalopathy: Secondary | ICD-10-CM | POA: Diagnosis not present

## 2020-09-03 DIAGNOSIS — Z7951 Long term (current) use of inhaled steroids: Secondary | ICD-10-CM | POA: Diagnosis not present

## 2020-09-03 DIAGNOSIS — J9622 Acute and chronic respiratory failure with hypercapnia: Secondary | ICD-10-CM

## 2020-09-03 DIAGNOSIS — D7589 Other specified diseases of blood and blood-forming organs: Secondary | ICD-10-CM | POA: Diagnosis not present

## 2020-09-03 DIAGNOSIS — J9601 Acute respiratory failure with hypoxia: Secondary | ICD-10-CM | POA: Insufficient documentation

## 2020-09-03 DIAGNOSIS — J441 Chronic obstructive pulmonary disease with (acute) exacerbation: Secondary | ICD-10-CM | POA: Diagnosis not present

## 2020-09-03 DIAGNOSIS — J9602 Acute respiratory failure with hypercapnia: Secondary | ICD-10-CM | POA: Diagnosis not present

## 2020-09-03 DIAGNOSIS — K219 Gastro-esophageal reflux disease without esophagitis: Secondary | ICD-10-CM | POA: Diagnosis present

## 2020-09-03 DIAGNOSIS — Z79899 Other long term (current) drug therapy: Secondary | ICD-10-CM | POA: Diagnosis not present

## 2020-09-03 DIAGNOSIS — Z7982 Long term (current) use of aspirin: Secondary | ICD-10-CM | POA: Diagnosis not present

## 2020-09-03 DIAGNOSIS — R0602 Shortness of breath: Secondary | ICD-10-CM | POA: Diagnosis not present

## 2020-09-03 DIAGNOSIS — Z9981 Dependence on supplemental oxygen: Secondary | ICD-10-CM | POA: Diagnosis not present

## 2020-09-03 DIAGNOSIS — F17210 Nicotine dependence, cigarettes, uncomplicated: Secondary | ICD-10-CM | POA: Diagnosis present

## 2020-09-03 DIAGNOSIS — J449 Chronic obstructive pulmonary disease, unspecified: Secondary | ICD-10-CM

## 2020-09-03 DIAGNOSIS — Z20822 Contact with and (suspected) exposure to covid-19: Secondary | ICD-10-CM | POA: Diagnosis present

## 2020-09-03 DIAGNOSIS — E872 Acidosis: Secondary | ICD-10-CM | POA: Diagnosis present

## 2020-09-03 DIAGNOSIS — D538 Other specified nutritional anemias: Secondary | ICD-10-CM | POA: Diagnosis present

## 2020-09-03 DIAGNOSIS — E785 Hyperlipidemia, unspecified: Secondary | ICD-10-CM | POA: Diagnosis present

## 2020-09-03 DIAGNOSIS — I10 Essential (primary) hypertension: Secondary | ICD-10-CM | POA: Diagnosis not present

## 2020-09-03 DIAGNOSIS — R0600 Dyspnea, unspecified: Secondary | ICD-10-CM | POA: Diagnosis not present

## 2020-09-03 LAB — CBC WITH DIFFERENTIAL/PLATELET
Abs Immature Granulocytes: 0.01 10*3/uL (ref 0.00–0.07)
Basophils Absolute: 0 10*3/uL (ref 0.0–0.1)
Basophils Relative: 0 %
Eosinophils Absolute: 0 10*3/uL (ref 0.0–0.5)
Eosinophils Relative: 0 %
HCT: 45.6 % (ref 39.0–52.0)
Hemoglobin: 14.2 g/dL (ref 13.0–17.0)
Immature Granulocytes: 0 %
Lymphocytes Relative: 15 %
Lymphs Abs: 1 10*3/uL (ref 0.7–4.0)
MCH: 32 pg (ref 26.0–34.0)
MCHC: 31.1 g/dL (ref 30.0–36.0)
MCV: 102.7 fL — ABNORMAL HIGH (ref 80.0–100.0)
Monocytes Absolute: 0.7 10*3/uL (ref 0.1–1.0)
Monocytes Relative: 10 %
Neutro Abs: 4.7 10*3/uL (ref 1.7–7.7)
Neutrophils Relative %: 75 %
Platelets: 206 10*3/uL (ref 150–400)
RBC: 4.44 MIL/uL (ref 4.22–5.81)
RDW: 11.7 % (ref 11.5–15.5)
WBC: 6.4 10*3/uL (ref 4.0–10.5)
nRBC: 0 % (ref 0.0–0.2)

## 2020-09-03 LAB — COMPREHENSIVE METABOLIC PANEL
ALT: 27 U/L (ref 0–44)
ALT: 29 U/L (ref 0–44)
AST: 24 U/L (ref 15–41)
AST: 26 U/L (ref 15–41)
Albumin: 3.8 g/dL (ref 3.5–5.0)
Albumin: 3.7 g/dL (ref 3.5–5.0)
Alkaline Phosphatase: 56 U/L (ref 38–126)
Alkaline Phosphatase: 58 U/L (ref 38–126)
Anion gap: 8 (ref 5–15)
Anion gap: 8 (ref 5–15)
BUN: 10 mg/dL (ref 6–20)
BUN: 13 mg/dL (ref 6–20)
CO2: 42 mmol/L — ABNORMAL HIGH (ref 22–32)
CO2: 44 mmol/L — ABNORMAL HIGH (ref 22–32)
Calcium: 9.2 mg/dL (ref 8.9–10.3)
Calcium: 9.3 mg/dL (ref 8.9–10.3)
Chloride: 85 mmol/L — ABNORMAL LOW (ref 98–111)
Chloride: 84 mmol/L — ABNORMAL LOW (ref 98–111)
Creatinine, Ser: 0.62 mg/dL (ref 0.61–1.24)
Creatinine, Ser: 0.59 mg/dL — ABNORMAL LOW (ref 0.61–1.24)
GFR, Estimated: >60 mL/min (ref >60)
GFR, Estimated: >60 mL/min (ref >60)
Glucose, Bld: 133 mg/dL — ABNORMAL HIGH (ref 70–99)
Glucose, Bld: 162 mg/dL — ABNORMAL HIGH (ref 70–99)
Potassium: 4.1 mmol/L (ref 3.5–5.1)
Potassium: 4.9 mmol/L (ref 3.5–5.1)
Sodium: 135 mmol/L (ref 135–145)
Sodium: 136 mmol/L (ref 135–145)
Total Bilirubin: 0.6 mg/dL (ref 0.3–1.2)
Total Bilirubin: 0.6 mg/dL (ref 0.3–1.2)
Total Protein: 7.8 g/dL (ref 6.5–8.1)
Total Protein: 7.9 g/dL (ref 6.5–8.1)

## 2020-09-03 LAB — BLOOD GAS, ARTERIAL
Acid-Base Excess: 19.9 mmol/L — ABNORMAL HIGH (ref 0.0–2.0)
Bicarbonate: 47.7 mmol/L — ABNORMAL HIGH (ref 20.0–28.0)
Drawn by: 53309
FIO2: 50
O2 Saturation: 97.3 %
Patient temperature: 37.1
pCO2 arterial: 110 mmHg (ref 32.0–48.0)
pH, Arterial: 7.26 — ABNORMAL LOW (ref 7.350–7.450)
pO2, Arterial: 99.8 mmHg (ref 83.0–108.0)

## 2020-09-03 LAB — CBC
HCT: 44.5 % (ref 39.0–52.0)
Hemoglobin: 14.3 g/dL (ref 13.0–17.0)
MCH: 32.8 pg (ref 26.0–34.0)
MCHC: 32.1 g/dL (ref 30.0–36.0)
MCV: 102.1 fL — ABNORMAL HIGH (ref 80.0–100.0)
Platelets: 212 10*3/uL (ref 150–400)
RBC: 4.36 MIL/uL (ref 4.22–5.81)
RDW: 11.7 % (ref 11.5–15.5)
WBC: 6.1 10*3/uL (ref 4.0–10.5)
nRBC: 0 % (ref 0.0–0.2)

## 2020-09-03 LAB — TROPONIN I (HIGH SENSITIVITY)
Troponin I (High Sensitivity): 7 ng/L (ref <18)
Troponin I (High Sensitivity): 6 ng/L (ref <18)

## 2020-09-03 LAB — PROCALCITONIN: Procalcitonin: <0.1 ng/mL

## 2020-09-03 LAB — POC SARS CORONAVIRUS 2 AG -  ED: SARS Coronavirus 2 Ag: NEGATIVE

## 2020-09-03 LAB — CBG MONITORING, ED: Glucose-Capillary: 155 mg/dL — ABNORMAL HIGH (ref 70–99)

## 2020-09-03 MED ORDER — MAGNESIUM SULFATE 2 GM/50ML IV SOLN
2.0000 g | Freq: Once | INTRAVENOUS | Status: AC
  Administered 2020-09-03: 2 g via INTRAVENOUS
  Filled 2020-09-03: qty 50

## 2020-09-03 MED ORDER — ONDANSETRON HCL 4 MG/2ML IJ SOLN
4.0000 mg | Freq: Four times a day (QID) | INTRAMUSCULAR | Status: DC | PRN
  Administered 2020-09-03: 4 mg via INTRAVENOUS
  Filled 2020-09-03: qty 2

## 2020-09-03 MED ORDER — ACETAMINOPHEN 325 MG PO TABS: 650.0000 mg | ORAL_TABLET | Freq: Four times a day (QID) | ORAL | Status: DC | PRN

## 2020-09-03 MED ORDER — DOCUSATE SODIUM 100 MG PO CAPS
100.0000 mg | ORAL_CAPSULE | Freq: Every day | ORAL | Status: DC
  Administered 2020-09-06 – 2020-09-07 (×2): 100 mg via ORAL
  Filled 2020-09-03 (×3): qty 1

## 2020-09-03 MED ORDER — METHYLPREDNISOLONE SODIUM SUCC 40 MG IJ SOLR
40.0000 mg | Freq: Three times a day (TID) | INTRAMUSCULAR | Status: AC
  Administered 2020-09-03 (×3): 40 mg via INTRAVENOUS
  Filled 2020-09-03 (×2): qty 1

## 2020-09-03 MED ORDER — FUROSEMIDE 20 MG PO TABS: 20.0000 mg | ORAL_TABLET | Freq: Every day | ORAL | Status: DC

## 2020-09-03 MED ORDER — SODIUM CHLORIDE 0.9 % IV SOLN
100.0000 mg | Freq: Two times a day (BID) | INTRAVENOUS | Status: DC
  Administered 2020-09-03 – 2020-09-06 (×7): 100 mg via INTRAVENOUS
  Filled 2020-09-03 (×8): qty 100

## 2020-09-03 MED ORDER — POTASSIUM CHLORIDE CRYS ER 20 MEQ PO TBCR
20.0000 meq | EXTENDED_RELEASE_TABLET | Freq: Every day | ORAL | Status: DC
  Administered 2020-09-06 – 2020-09-07 (×2): 20 meq via ORAL
  Filled 2020-09-03 (×3): qty 1

## 2020-09-03 MED ORDER — ACETAMINOPHEN 650 MG RE SUPP: 650.0000 mg | Freq: Four times a day (QID) | RECTAL | Status: DC | PRN

## 2020-09-03 MED ORDER — ALBUTEROL SULFATE (2.5 MG/3ML) 0.083% IN NEBU
2.5000 mg | INHALATION_SOLUTION | RESPIRATORY_TRACT | Status: DC | PRN
  Administered 2020-09-03: 2.5 mg via RESPIRATORY_TRACT

## 2020-09-03 MED ORDER — VITAMIN D 25 MCG (1000 UNIT) PO TABS
2000.0000 [IU] | ORAL_TABLET | Freq: Every day | ORAL | Status: DC
  Administered 2020-09-06 – 2020-09-07 (×2): 2000 [IU] via ORAL
  Filled 2020-09-03 (×3): qty 2

## 2020-09-03 MED ORDER — ROSUVASTATIN CALCIUM 20 MG PO TABS: 20.0000 mg | ORAL_TABLET | Freq: Every day | ORAL | Status: DC

## 2020-09-03 MED ORDER — ASPIRIN EC 81 MG PO TBEC: 81.0000 mg | DELAYED_RELEASE_TABLET | Freq: Every day | ORAL | Status: DC

## 2020-09-03 MED ORDER — PANTOPRAZOLE SODIUM 40 MG IV SOLR
40.0000 mg | INTRAVENOUS | Status: DC
  Administered 2020-09-03 – 2020-09-06 (×4): 40 mg via INTRAVENOUS
  Filled 2020-09-03 (×4): qty 40

## 2020-09-03 MED ORDER — IPRATROPIUM-ALBUTEROL 0.5-2.5 (3) MG/3ML IN SOLN
3.0000 mL | RESPIRATORY_TRACT | Status: AC
  Administered 2020-09-03 (×2): 3 mL via RESPIRATORY_TRACT
  Filled 2020-09-03: qty 3

## 2020-09-03 MED ORDER — DM-GUAIFENESIN ER 30-600 MG PO TB12: 1.0000 | ORAL_TABLET | Freq: Two times a day (BID) | ORAL | Status: DC | PRN

## 2020-09-03 MED ORDER — METHYLPREDNISOLONE SODIUM SUCC 125 MG IJ SOLR
125.0000 mg | Freq: Once | INTRAMUSCULAR | Status: AC
  Administered 2020-09-03: 125 mg via INTRAVENOUS
  Filled 2020-09-03: qty 2

## 2020-09-03 MED ORDER — FAMOTIDINE 20 MG PO TABS: 20.0000 mg | ORAL_TABLET | Freq: Two times a day (BID) | ORAL | Status: DC

## 2020-09-03 MED ORDER — ALBUTEROL SULFATE (2.5 MG/3ML) 0.083% IN NEBU
2.5000 mg | INHALATION_SOLUTION | Freq: Four times a day (QID) | RESPIRATORY_TRACT | Status: DC
  Administered 2020-09-03 – 2020-09-05 (×10): 2.5 mg via RESPIRATORY_TRACT
  Filled 2020-09-03 (×11): qty 3

## 2020-09-03 MED ORDER — ALBUTEROL (5 MG/ML) CONTINUOUS INHALATION SOLN
10.0000 mg/h | INHALATION_SOLUTION | RESPIRATORY_TRACT | Status: DC
  Administered 2020-09-03: 10 mg/h via RESPIRATORY_TRACT
  Filled 2020-09-03: qty 20

## 2020-09-03 MED ORDER — ONDANSETRON HCL 4 MG PO TABS: 4.0000 mg | ORAL_TABLET | Freq: Four times a day (QID) | ORAL | Status: DC | PRN

## 2020-09-03 MED ORDER — ENOXAPARIN SODIUM 40 MG/0.4ML ~~LOC~~ SOLN
40.0000 mg | SUBCUTANEOUS | Status: DC
  Administered 2020-09-04 – 2020-09-07 (×4): 40 mg via SUBCUTANEOUS
  Filled 2020-09-03 (×4): qty 0.4

## 2020-09-03 MED ORDER — LEVOFLOXACIN IN D5W 500 MG/100ML IV SOLN
500.0000 mg | Freq: Once | INTRAVENOUS | Status: AC
  Administered 2020-09-03: 500 mg via INTRAVENOUS
  Filled 2020-09-03: qty 100

## 2020-09-03 MED ORDER — PREDNISONE 20 MG PO TABS
40.0000 mg | ORAL_TABLET | Freq: Every day | ORAL | Status: DC
  Filled 2020-09-03: qty 2

## 2020-09-03 MED ORDER — IPRATROPIUM BROMIDE 0.02 % IN SOLN
0.5000 mg | Freq: Four times a day (QID) | RESPIRATORY_TRACT | Status: DC
  Administered 2020-09-03 – 2020-09-05 (×11): 0.5 mg via RESPIRATORY_TRACT
  Filled 2020-09-03 (×11): qty 2.5

## 2020-09-03 NOTE — ED Notes (Addendum)
He ambulated in but only could get to the door without becoming very sob.... his sp02 maintain 93-94%.. he did drop from 99%. The wife is worried about sending him home.. Pt a&ox4 at this time  Made provider aware.

## 2020-09-03 NOTE — Progress Notes (Signed)
OT Cancellation Note  Patient Details Name: Anthony Nelson MRN: [id number] DOB: 1960/09/06   Cancelled Treatment:    Reason Eval/Treat Not Completed: Patient not medically ready - increasing 02 needs with pt now on BiPAP.  Will reattempt as medically appropriate.  Nilsa Nutting., OTR/L Acute Rehabilitation Services Pager [phone] Office [phone]   Lucille Passy M 09/03/2020, 12:52 PM

## 2020-09-03 NOTE — Progress Notes (Signed)
PROGRESS NOTE    Anthony Nelson  IZT:[id number] DOB: 1960/09/17 DOA: 09/02/2020 PCP: Elsie Stain, MD    Brief Narrative:  Anthony Nelson is a 60 year old male with past medical history significant for chronic hypoxic respiratory failure, COPD, chronic diastolic congestive heart failure, GERD, pulmonary hypertension who presented to the emergency department with progressive dyspnea associated with whitish sputum, cough and increasing oxygen requirement.  Patient denied fever, chills, sore throat, nausea, vomiting, chest pain, abdominal pain, diarrhea, constipation, dysuria at time of admission.  In the ED, temperature 98.5 F, HR 90, RR 22, BP 143/90, SPO2 98% on 4 L nasal cannula.,  Potassium 4.1, chloride 85, CO2 42, glucose 133, BUN 10, creatinine 0.62.  He is within normal limits.  SARS-CoV-2/Covid-19 test negative.  X-ray with pulmonary hyperinflation consistent with COPD, no consolidation appreciated.  NSR, normal rate, no concerning ST elevation/depression or T wave inversion.  Bronchodilators, Levaquin 500 mg IV and Solu-Medrol 125 mg IV by EDP.  Hospital service consulted for further evaluation and management of acute on chronic respiratory failure secondary to COPD exacerbation.   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  Patient's vital signs were stable.  Nursing and respiratory called to bedside stat.  Stat ABG w/ PH 7.11, PaCO2 >110, PaO2 379.  EKG with NSR with no concerning dynamic  changes. --Admit to inpatient, progressive unit --CT head without contrast: Pending --Troponin: Pending --Patient placed immediately on BiPAP; will continue for 12 hours with repeat ABG this afternoon  Acute on chronic hypoxic hypercapnic respiratory failure Pulmonary hypertension COPD exacerbation Patient presenting to the ED with progressive shortness of breath.  No concerning consolidation on chest x-ray.  ABG with pH 7.11/PaCO2>110/PaO2 379. --Continuous BiPAP --Albuterol neb every 6 hours scheduled; every 4 hours as needed wheezing --Ipratropium neb every 6 hours --Solu-Medrol 40 mg IV every 8 hours --Doxycycline 100 mg IV every 12 hours --Continue supplemental oxygen, maintain SPO2 greater than 88% --Supportive care --Repeat ABG this afternoon --If further decompensates, will discuss with PCCM for transfer and intubation  Chronic diastolic congestive heart failure, compensated 03/09/2020 with LVEF 60 to 65%, no LV regional wall motion abnormalities, RV systolic function severely reduced, RV severely enlarged, trivial MR, moderate TR, no aortic stenosis, IVC dilated.  Patient on furosemide 20 mg p.o. daily at home.  No concerning signs or volume overload. --Hold home furosemide for now --Strict I's and O's and daily weights  Hyperlipidemia: Holding home Crestor 20 mg p.o. daily until mental status improves  Macrocytic anemia Hemoglobin 14.2, MCV 102.1 stable.  Atherosclerosis of the aorta Holding home aspirin and statin until mental status improves  GERD Pepcid 20 mg twice daily at home. Holding oral medications until mental status improves. --Protonix 40 mg IV every 24 hours   DVT prophylaxis: Lovenox   Code Status: Full Code Family Communication: Updated patient spouse regarding mental status change at this morning and plan of care via telephone  Disposition Plan:  Level of care: Progressive Status is: Inpatient  Remains inpatient appropriate because:Altered mental  status,  Ongoing diagnostic testing needed not appropriate for outpatient work up, Unsafe d/c plan, IV treatments appropriate due to intensity of illness or inability to take PO and Inpatient level of care appropriate due to severity of illness   Dispo: The patient is from: Home              Anticipated d/c is to: Home              Patient currently is not medically stable to d/c.   Difficult to place patient No   Consultants:   None  Procedures:   BiPAP  Antimicrobials:   Levaquin 3/5 - 3/5  Doxycycline 3/5>>   Subjective: Patient seen and examined at bedside.  Patient is obtunded, leaning over the railing with supplemental oxygen and neb mask in place.  No family present.  Patient is unable to respond with verbal or painful stimuli.  Called nursing and RT stat to the bedside.  Stat ABG with pH 7.11 and PaCO2 greater than 110.  Patient also actively having emesis which resolved.  Patient was immediately placed on BiPAP for hypercarbic respiratory failure.  EKG within normal limits without any concerning ST elevation/depression.  Hemodynamically stable with SPO2 100% on 4 L nasal cannula.  Ordered stat troponin, CBC, CMP and CT head without contrast.  Due to significant decline in inpatient status, upgraded to inpatient progressive care.  Unable to obtain any further ROS from patient due to mental status.  Objective: Vitals:   09/03/20 0700 09/03/20 0815 09/03/20 0830 09/03/20 0845  BP: 121/69 (!) 143/89 131/84 139/88  Pulse: 89 (!) 110 (!) 101 100  Resp: (!) 21 (!) 31 15 (!) 25  Temp:      TempSrc:      SpO2: 99% 97% 90% 92%  Weight:      Height:       No intake or output data in the 24 hours ending 09/03/20 0924 Filed Weights   09/02/20 2358  Weight: 58.6 kg    Examination:  General exam: Unresponsive, obtunded Respiratory system: Decreased breath sounds bilateral bases, no crackles, mild late expiratory wheezes, slightly increased respiratory effort, on 4 L nasal  cannula with SPO2 100% Cardiovascular system: S1 & S2 heard, RRR. No JVD, murmurs, rubs, gallops or clicks. No pedal edema. Gastrointestinal system: Abdomen is nondistended, soft and nontender. No organomegaly or masses felt. Normal bowel sounds heard. Central nervous system: Unresponsive/obtunded Extremities: No peripheral edema Skin: No rashes, lesions or ulcers Psychiatry: Unable to evaluate secondary to mental status    Data Reviewed: I have personally reviewed following labs and imaging studies  CBC: Recent Labs  Lab 09/03/20 0010 09/03/20 0804  WBC 6.4 6.1  NEUTROABS 4.7  --   HGB 14.2 14.3  HCT 45.6 44.5  MCV 102.7* 102.1*  PLT 206 993   Basic Metabolic Panel: Recent Labs  Lab 09/03/20 0010  NA 135  K 4.1  CL 85*  CO2 42*  GLUCOSE 133*  BUN 10  CREATININE 0.62  CALCIUM 9.2   GFR: Estimated Creatinine Clearance: 81.4 mL/min (by C-G formula based on SCr of 0.62 mg/dL). Liver Function Tests: Recent Labs  Lab 09/03/20 0010  AST 24  ALT 27  ALKPHOS 56  BILITOT 0.6  PROT 7.8  ALBUMIN 3.8   No results for input(s): LIPASE, AMYLASE in the last 168 hours. No results for input(s): AMMONIA in the last 168 hours. Coagulation Profile: No results for input(s): INR, PROTIME in the last 168 hours. Cardiac Enzymes: No  results for input(s): CKTOTAL, CKMB, CKMBINDEX, TROPONINI in the last 168 hours. BNP (last 3 results) No results for input(s): PROBNP in the last 8760 hours. HbA1C: No results for input(s): HGBA1C in the last 72 hours. CBG: Recent Labs  Lab 09/03/20 0801  GLUCAP 155*   Lipid Profile: No results for input(s): CHOL, HDL, LDLCALC, TRIG, CHOLHDL, LDLDIRECT in the last 72 hours. Thyroid Function Tests: No results for input(s): TSH, T4TOTAL, FREET4, T3FREE, THYROIDAB in the last 72 hours. Anemia Panel: No results for input(s): VITAMINB12, FOLATE, FERRITIN, TIBC, IRON, RETICCTPCT in the last 72 hours. Sepsis Labs: No results for input(s):  PROCALCITON, LATICACIDVEN in the last 168 hours.  No results found for this or any previous visit (from the past 240 hour(s)).       Radiology Studies: DG Chest Portable 1 View  Result Date: 09/03/2020 CLINICAL DATA:  Dyspnea EXAM: PORTABLE CHEST 1 VIEW COMPARISON:  03/09/2020, CT 11/26/2019 FINDINGS: The lungs are symmetrically hyperinflated in keeping with changes of underlying COPD. No pneumothorax or pleural effusion. Cardiac size within normal limits. Central pulmonary arteries appear mildly enlarged suggesting changes of pulmonary arterial hypertension. The pulmonary vascularity is otherwise unremarkable. IMPRESSION: Pulmonary hyperinflation in keeping with changes of underlying COPD. Central pulmonary arterial enlargement indicative of pulmonary artery hypertension. Electronically Signed   By: Fidela Salisbury MD   On: 09/03/2020 01:14        Scheduled Meds: . albuterol  2.5 mg Nebulization Q6H  . aspirin EC  81 mg Oral Daily  . cholecalciferol  2,000 Units Oral Daily  . docusate sodium  100 mg Oral Daily  . enoxaparin (LOVENOX) injection  40 mg Subcutaneous Q24H  . famotidine  20 mg Oral BID  . furosemide  20 mg Oral Daily  . ipratropium  0.5 mg Nebulization Q6H  . methylPREDNISolone (SOLU-MEDROL) injection  40 mg Intravenous Q8H   Followed by  . [START ON 09/04/2020] predniSONE  40 mg Oral Q breakfast  . potassium chloride SA  20 mEq Oral Daily  . rosuvastatin  20 mg Oral Daily   Continuous Infusions: . albuterol 10 mg/hr (09/03/20 0537)     LOS: 0 days    Critical Care Time Upon my evaluation, this patient had a high probability of imminent or life-threatening deterioration due to acute metabolic encephalopathy secondary to severe hypercapnic respiratory failure in which patient is unresponsive/obtunded., which required my direct attention, intervention, and personal management.  I have personally provided 42 minutes of critical care time exclusive of my time spent on  separately billable procedures.  Time includes review of laboratory data, radiology results, discussion with consultants, and monitoring for potential decompensation.       Shekira Drummer J [place] DO [facility] Available via Epic secure chat 7am-7pm After these hours, please refer to coverage provider listed on [email or web address] 09/03/2020, 9:24 AM

## 2020-09-03 NOTE — ED Notes (Signed)
Resp at bedside  MD Randall Hiss at bedside

## 2020-09-03 NOTE — H&P (Signed)
History and Physical    Sadie Pickar GOT:[id number] DOB: 1960-07-28 DOA: 09/02/2020  PCP: Elsie Stain, MD   Patient coming from: Home.  I have personally briefly reviewed patient's old medical records in [place]  Chief Complaint: Shortness of breath.  HPI: Anthony Nelson is a 60 y.o. male with medical history significant of aortic atherosclerosis, chronic bronchitis, COPD Gold 4, chronic diastolic heart failure, GERD, pulmonary hypertension due to COPD, chronic respiratory failure with hypoxia and hypercapnia who is coming to the emergency department due to progressively worse dyspnea associated with whitish sputum productive cough and increasing her oxygen requirement.  No fever, chills, sore throat, nausea, vomiting, chest pain, abdominal pain, diarrhea, constipation, melena or hematochezia.  No dysuria, frequency hematuria.  No polyuria, polydipsia, polyphagia or blurred vision.  ED Course: Initial vital signs were temperature 98.5 F, pulse 90, respirations 22, BP 143/90 mmHg O2 sat 98% on room air.  The patient received supplemental oxygen, bronchodilators, Levaquin 500 mg IVPB and methylprednisolone 125 mg IVP.  Labwork: CBC shows a white count of 6.4, hemoglobin 14.2 g/dL with an MCV of 102.7 fL and platelets of 206.  CMP shows a chloride of 85 and CO2 of 42 mmol/L.  Glucose was 133 mg/dL.  All other CMP values were normal.  Coronavirus 2 antigen was negative.  Imaging: A portable 1 view chest radiograph showed pulmonary hyperinflation and central pulmonary artery enlargement.  There was no pneumothorax, pleural effusion or obvious airspace disease.  Please see images and full radiology report for further detail.  Review of Systems: As per HPI otherwise all other systems reviewed and are negative.  Past Medical History:  Diagnosis Date  . Atherosclerosis of aorta ([place]) 04/06/2020  . Bronchitis   . COPD (chronic obstructive pulmonary disease) ([place])   . Diastolic heart  failure ([place])   . GERD (gastroesophageal reflux disease)   . Pulmonary hypertension ([place])   . 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 Respiratory failure with hypoxia [facility])    Past Surgical History:  Procedure Laterality Date  . HERNIA REPAIR     Social History  reports that he has been smoking cigarettes. He has a 4.40 pack-year smoking history. He has never used smokeless tobacco. He reports previous alcohol use. He reports that he does not use drugs.  Allergies  Allergen Reactions  . Erythromycin Other (See Comments)    Allergy from childhood- does not take it   Family History  Problem Relation Age of Onset  . Cancer Mother   . Cancer Father   . CAD Father   . Heart failure Father   . Diabetes Maternal Grandmother   . Kidney disease Maternal Grandmother   . Kidney cancer Paternal Grandmother   . Heart attack Paternal Grandfather    Prior to Admission medications   Medication Sig Start Date End Date Taking? Authorizing Provider  albuterol (PROVENTIL) (2.5 MG/3ML) 0.083% nebulizer solution Take 3 mLs (2.5 mg total) by nebulization every 6 (six) hours as needed for wheezing or shortness of breath. 07/29/20 07/29/21 Yes Elsie Stain, MD  albuterol (VENTOLIN HFA) 108 (90 Base) MCG/ACT inhaler Inhale 2 puffs into the lungs every 6 (six) hours as needed for wheezing or shortness of breath. 07/29/20  Yes Elsie Stain, MD  Ascorbic Acid (VITAMIN C) 100 MG tablet Take 100 mg by mouth daily.   Yes [provider]  aspirin EC 81 MG tablet Take 81 mg by mouth daily. Swallow whole.  Yes [provider]  budesonide-formoterol (SYMBICORT) 160-4.5 MCG/ACT inhaler Inhale 2 puffs into the lungs 2 (two) times daily. 05/10/20  Yes Elsie Stain, MD  Cholecalciferol (VITAMIN D3) 50 MCG (2000 UT) TABS Take 2,000 Units by mouth daily.   Yes [provider]  Cyanocobalamin (B-12 PO) Take 1 tablet by mouth daily.    Yes [provider]  dextromethorphan-guaiFENesin (MUCINEX DM) 30-600 MG 12hr tablet Take 1 tablet by mouth 2 (two) times daily as needed for cough.   Yes [provider]  docusate sodium (COLACE) 100 MG capsule Take 1 capsule (100 mg total) by mouth daily. 05/01/19  Yes Swayze, Ava, DO  famotidine (PEPCID) 20 MG tablet TAKE 1 TABLET (20 MG TOTAL) BY MOUTH 2 (TWO) TIMES DAILY. Patient taking differently: Take 20 mg by mouth 2 (two) times daily. 07/29/20  Yes Elsie Stain, MD  furosemide (LASIX) 20 MG tablet Take 1 tablet (20 mg total) by mouth daily. 07/29/20 07/29/21 Yes Elsie Stain, MD  Multiple Vitamins-Minerals (ZINC PO) Take 1 tablet by mouth daily.   Yes [provider]  OXYGEN Inhale 2 L/min into the lungs continuous.    Yes [provider]  potassium chloride SA (KLOR-CON) 20 MEQ tablet TAKE 1 TABLET (20 MEQ TOTAL) BY MOUTH DAILY. 07/29/20  Yes Elsie Stain, MD  rosuvastatin (CRESTOR) 20 MG tablet Take 1 tablet (20 mg total) by mouth daily. 08/02/20 09/01/20 Yes Elsie Stain, MD  umeclidinium bromide (INCRUSE ELLIPTA) 62.5 MCG/INH AEPB Inhale 1 puff into the lungs daily. 05/10/20  Yes Elsie Stain, MD    Physical Exam: Vitals:   09/03/20 0500 09/03/20 0515 09/03/20 0517 09/03/20 0540  BP:   (!) 147/95 124/69  Pulse: (!) 102 (!) 113 (!) 114 (!) 106  Resp: (!) 23 (!) 25 (!) 35 17  Temp:      TempSrc:      SpO2: 98% 99% 98% 99%  Weight:      Height:       Constitutional: Looks chronically ill, but currently in NAD. Eyes: PERRL, lids, conjunctiva and sclera are injected. ENMT: Mucous membranes are moist. Posterior pharynx clear of any exudate or lesions. Neck: normal, supple, no masses, no thyromegaly Respiratory: Tachypneic in the mid 20s.  Decreased breath sounds with bilateral wheezing, no rhonchi or crackles.  No accessory muscle use (had supraclavicular retractions earlier).  Cardiovascular: Tachycardic at 103 bpm, no murmurs  / rubs / gallops. No extremity edema. 2+ pedal pulses. No carotid bruits.  Abdomen: No distention.  Bowel sounds positive.  Soft, no tenderness, no masses palpated. No hepatosplenomegaly.  Musculoskeletal: No clubbing / cyanosis.  Good ROM, no contractures. Normal muscle tone.  Skin: No obvious acute rashes, lesions, ulcers very limited dermatological examination. Neurologic: CN 2-12 grossly intact. Sensation intact, DTR normal. Strength 5/5 in all 4.  Psychiatric: Alert and oriented x 3. Normal mood.   Labs on Admission: I have personally reviewed following labs and imaging studies  CBC: Recent Labs  Lab 09/03/20 0010  WBC 6.4  NEUTROABS 4.7  HGB 14.2  HCT 45.6  MCV 102.7*  PLT 161    Basic Metabolic Panel: Recent Labs  Lab 09/03/20 0010  NA 135  K 4.1  CL 85*  CO2 42*  GLUCOSE 133*  BUN 10  CREATININE 0.62  CALCIUM 9.2    GFR: Estimated Creatinine Clearance: 81.4 mL/min (by C-G formula based on SCr of 0.62 mg/dL).  Liver Function Tests: Recent Labs  Lab  09/03/20 0010  AST 24  ALT 27  ALKPHOS 56  BILITOT 0.6  PROT 7.8  ALBUMIN 3.8   Radiological Exams on Admission: DG Chest Portable 1 View  Result Date: 09/03/2020 CLINICAL DATA:  Dyspnea EXAM: PORTABLE CHEST 1 VIEW COMPARISON:  03/09/2020, CT 11/26/2019 FINDINGS: The lungs are symmetrically hyperinflated in keeping with changes of underlying COPD. No pneumothorax or pleural effusion. Cardiac size within normal limits. Central pulmonary arteries appear mildly enlarged suggesting changes of pulmonary arterial hypertension. The pulmonary vascularity is otherwise unremarkable. IMPRESSION: Pulmonary hyperinflation in keeping with changes of underlying COPD. Central pulmonary arterial enlargement indicative of pulmonary artery hypertension. Electronically Signed   By: Fidela Salisbury MD   On: 09/03/2020 01:14   03/09/2020 echocardiogram IMPRESSIONS:   1. Severe RV failure.  2. Left ventricular ejection fraction,  by estimation, is 60 to 65%. The  left ventricle has normal function. The left ventricle has no regional  wall motion abnormalities. Left ventricular diastolic parameters were  normal.  3. Right ventricular systolic function is severely reduced. The right  ventricular size is severely enlarged. There is moderately elevated  pulmonary artery systolic pressure.  4. The mitral valve is normal in structure. Trivial mitral valve  regurgitation. No evidence of mitral stenosis.  5. Tricuspid valve regurgitation is mild to moderate.  6. The aortic valve is tricuspid. Aortic valve regurgitation is not  visualized. Mild aortic valve sclerosis is present, with no evidence of  aortic valve stenosis.  7. The inferior vena cava is dilated in size with <50% respiratory  variability, suggesting right atrial pressure of 15 mmHg.   EKG: Independently reviewed. Vent. rate 90 BPM PR interval * ms QRS duration 99 ms QT/QTc 368/456 ms P-R-T axes 94 205 87 Sinus rhythm Right atrial enlargement Consider right ventricular hypertrophy Repol abnrm suggests ischemia, lateral leads Has significant artifact, which impairs interpretation.  Assessment/Plan Principal Problem:   Acute on chronic respiratory failure with hypoxia and hypercapnia (HCC)   Due to COPD GOLD IV with acute exacerbation (HCC) Observation/stepdown. Continue supplemental oxygen. BiPAP ventilation mode as needed. Magnesium sulfate 2 g IVPB. Continue scheduled and as needed bronchodilators. Solu-Medrol 40 mg IVP every 8 hours x 3 doses. Prednisone taper after methylprednisolone course.  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 Continue diuretic.    Chronic diastolic heart failure (HCC) No signs of hypervolemia. Continue furosemide 20 mg p.o. daily. Continue potassium supplementation.    Hyperlipidemia   Atherosclerosis of aorta (HCC) Continue  rosuvastatin 20 mg p.o. daily.    Macrocytosis Taking B12 supplements. Follow MCV on repeat CBC.   DVT prophylaxis: Lovenox SQ. Code Status:   Full code. Family Communication: Disposition Plan:   Patient is from:  Home.  Anticipated DC to:  Home.  Anticipated DC date:  09-04-2020  Anticipated DC barriers: Clinical improvement.  Consults called: Admission status:  Observation/progressive unit.  High due to increased oxygen requirement with significant symptomatology secondary to acute on chronic  Severity of Illness:  Reubin Milan MD [facility]  How to contact the [facility] Attending or Consulting provider [place] or covering provider during after hours [place], for this patient?   1. Check the care team in [facility] and look for a) attending/consulting [facility] provider listed and b) the [facility] team listed 2. Log into [email or web address] and use [place]'s universal password to access. If you do not have the password, please contact the hospital operator. 3. Locate  the [facility] provider you are looking for under [facility] and page to a number that you can be directly reached. 4. If you still have difficulty reaching the provider, please page the [facility] (Director on Call) for the Hospitalists listed on amion for assistance.  09/03/2020, 6:10 AM   This document was prepared using Dragon voice recognition software and may contain some unintended transcription errors.

## 2020-09-03 NOTE — ED Notes (Signed)
MD at bedside

## 2020-09-03 NOTE — ED Notes (Signed)
Pt unable to follow commands

## 2020-09-03 NOTE — Progress Notes (Signed)
STAT ABG drawn per MD at bedside. Critical values: pH: 7.11 PaCO2: >110 PaO2: 379  Pt placed on NIV.

## 2020-09-03 NOTE — ED Notes (Signed)
Xray at bedside

## 2020-09-03 NOTE — Progress Notes (Signed)
MD notified of ABG results: pH 7.189 PaCO2 >110  NIV settings increased.

## 2020-09-03 NOTE — ED Provider Notes (Signed)
[facility] EMERGENCY DEPARTMENT Provider Note   CSN: [id number] Arrival date & time: 09/02/20  2349     History Chief Complaint  Patient presents with  . Shortness of Breath    Anthony Nelson is a 60 y.o. male.  History of COPD and CHF who presents the emerge department today with 3 days of progressively worsening dyspnea.  Also had a cough with productive sputum that has changed in color slightly.  Patient states that this got significantly worse tonight so EMS was called.  Apparently patient looked ill per his wife's report.  She states that she follows his oxygens and he has not been hypoxic recently.  She states that tonight he just had a faraway look in his eyes that worried her when associated with her shortness of breath.  EMS gave him a DuoNeb and brought her here.  Patient states that seem to help some but does still feel pretty short of breath        Past Medical History:  Diagnosis Date  . Atherosclerosis of aorta ([place]) 04/06/2020  . Bronchitis   . COPD (chronic obstructive pulmonary disease) ([place])   . Diastolic heart failure ([place])   . GERD (gastroesophageal reflux disease)   . Pulmonary hypertension ([place])   . 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 Respiratory failure with hypoxia [facility])     Patient Active Problem List   Diagnosis Date Noted  . Acute respiratory failure with hypoxia ([place]) 09/03/2020  . COPD GOLD IV with acute exacerbation ([place]) 09/03/2020  . Macrocytosis 09/03/2020  . Acute on chronic respiratory failure with hypoxia and hypercapnia ([place]) 09/03/2020  . Atherosclerosis of aorta ([place]) 04/06/2020  . Weight loss 08/11/2019  . Chronic diastolic heart failure ([place]) 04/25/2019  . Chronic respiratory failure with hypoxia ([place]) 04/25/2019  . 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 Cor pulmonale (chronic) ([place]) 03/19/2018  . COPD GOLD IV   03/18/2018  . Cigarette smoker 02/08/2018    Past Surgical History:  Procedure Laterality Date  . HERNIA REPAIR         Family History  Problem Relation Age of Onset  . Cancer Mother   . Cancer Father   . CAD Father   . Heart failure Father   . Diabetes Maternal Grandmother   . Kidney disease Maternal Grandmother   . Kidney cancer Paternal Grandmother   . Heart attack Paternal Grandfather     Social History   Tobacco Use  . Smoking status: Current Every Day Smoker    Packs/day: 0.10    Years: 44.00    Pack years: 4.40    Types: Cigarettes  . Smokeless tobacco: Never Used  Vaping Use  . Vaping Use: Never used  Substance Use Topics  . Alcohol use: Not Currently    Comment: Socially   . Drug use: No    Home Medications Prior to Admission medications   Medication Sig Start Date End Date Taking? Authorizing Provider  albuterol (PROVENTIL) (2.5 MG/3ML) 0.083% nebulizer solution Take 3 mLs (2.5 mg total) by nebulization every 6 (six) hours as needed for wheezing or shortness of breath. 07/29/20 07/29/21 Yes Elsie Stain, MD  albuterol (VENTOLIN HFA) 108 (90 Base) MCG/ACT inhaler Inhale 2 puffs into the lungs every 6 (six) hours as needed for wheezing or shortness of breath. 07/29/20  Yes Elsie Stain, MD  Ascorbic Acid (VITAMIN C) 100 MG tablet Take 100 mg by  mouth daily.   Yes [provider]  aspirin EC 81 MG tablet Take 81 mg by mouth daily. Swallow whole.   Yes [provider]  budesonide-formoterol (SYMBICORT) 160-4.5 MCG/ACT inhaler Inhale 2 puffs into the lungs 2 (two) times daily. 05/10/20  Yes Elsie Stain, MD  Cholecalciferol (VITAMIN D3) 50 MCG (2000 UT) TABS Take 2,000 Units by mouth daily.   Yes [provider]  Cyanocobalamin (B-12 PO) Take 1 tablet by mouth daily.   Yes [provider]  dextromethorphan-guaiFENesin (MUCINEX DM) 30-600 MG 12hr tablet Take 1 tablet by mouth 2 (two) times daily as needed for cough.    Yes [provider]  docusate sodium (COLACE) 100 MG capsule Take 1 capsule (100 mg total) by mouth daily. 05/01/19  Yes Swayze, Ava, DO  famotidine (PEPCID) 20 MG tablet TAKE 1 TABLET (20 MG TOTAL) BY MOUTH 2 (TWO) TIMES DAILY. Patient taking differently: Take 20 mg by mouth 2 (two) times daily. 07/29/20  Yes Elsie Stain, MD  furosemide (LASIX) 20 MG tablet Take 1 tablet (20 mg total) by mouth daily. 07/29/20 07/29/21 Yes Elsie Stain, MD  Multiple Vitamins-Minerals (ZINC PO) Take 1 tablet by mouth daily.   Yes [provider]  OXYGEN Inhale 2 L/min into the lungs continuous.    Yes [provider]  potassium chloride SA (KLOR-CON) 20 MEQ tablet TAKE 1 TABLET (20 MEQ TOTAL) BY MOUTH DAILY. 07/29/20  Yes Elsie Stain, MD  rosuvastatin (CRESTOR) 20 MG tablet Take 1 tablet (20 mg total) by mouth daily. 08/02/20 09/01/20 Yes Elsie Stain, MD  umeclidinium bromide (INCRUSE ELLIPTA) 62.5 MCG/INH AEPB Inhale 1 puff into the lungs daily. 05/10/20  Yes Elsie Stain, MD    Allergies    Erythromycin  Review of Systems   Review of Systems  All other systems reviewed and are negative.   Physical Exam Updated Vital Signs BP 124/69   Pulse (!) 106   Temp 98.5 F (36.9 C) (Oral)   Resp 17   Ht _0  (1.803 m)   Wt 58.6 kg   SpO2 99%   BMI 18.02 kg/m   Physical Exam Vitals and nursing note reviewed.  Constitutional:      Appearance: He is well-developed and well-nourished.  HENT:     Head: Normocephalic and atraumatic.  Cardiovascular:     Rate and Rhythm: Tachycardia present.  Pulmonary:     Effort: Pulmonary effort is normal. Tachypnea present. No respiratory distress.     Breath sounds: Decreased breath sounds and wheezing present.     Comments: Supraclavicular retractions Abdominal:     General: There is no distension.  Musculoskeletal:        General: Normal range of motion.     Cervical back: Normal range of motion.  Skin:     General: Skin is warm and dry.  Neurological:     General: No focal deficit present.     Mental Status: He is alert.     ED Results / Procedures / Treatments   Labs (all labs ordered are listed, but only abnormal results are displayed) Labs Reviewed  CBC WITH DIFFERENTIAL/PLATELET - Abnormal; Notable for the following components:      Result Value   MCV 102.7 (*)    All other components within normal limits  COMPREHENSIVE METABOLIC PANEL - Abnormal; Notable for the following components:   Chloride 85 (*)    CO2 42 (*)    Glucose, Bld 133 (*)  All other components within normal limits  PROCALCITONIN  POC SARS CORONAVIRUS 2 AG -  ED    EKG None  Radiology DG Chest Portable 1 View  Result Date: 09/03/2020 CLINICAL DATA:  Dyspnea EXAM: PORTABLE CHEST 1 VIEW COMPARISON:  03/09/2020, CT 11/26/2019 FINDINGS: The lungs are symmetrically hyperinflated in keeping with changes of underlying COPD. No pneumothorax or pleural effusion. Cardiac size within normal limits. Central pulmonary arteries appear mildly enlarged suggesting changes of pulmonary arterial hypertension. The pulmonary vascularity is otherwise unremarkable. IMPRESSION: Pulmonary hyperinflation in keeping with changes of underlying COPD. Central pulmonary arterial enlargement indicative of pulmonary artery hypertension. Electronically Signed   By: Fidela Salisbury MD   On: 09/03/2020 01:14    Procedures .Critical Care Performed by: Merrily Pew, MD Authorized by: Merrily Pew, MD   Critical care provider statement:    Critical care time (minutes):  45   Critical care was necessary to treat or prevent imminent or life-threatening deterioration of the following conditions:  Respiratory failure   Critical care was time spent personally by me on the following activities:  Discussions with consultants, evaluation of patient's response to treatment, examination of patient, ordering and performing treatments and interventions,  ordering and review of laboratory studies, ordering and review of radiographic studies, pulse oximetry, re-evaluation of patient's condition, obtaining history from patient or surrogate and review of old charts     Medications Ordered in ED Medications  ipratropium-albuterol (DUONEB) 0.5-2.5 (3) MG/3ML nebulizer solution 3 mL (3 mLs Nebulization Not Given 09/03/20 0038)  albuterol (PROVENTIL,VENTOLIN) solution continuous neb (10 mg/hr Nebulization New Bag/Given 09/03/20 0537)  enoxaparin (LOVENOX) injection 40 mg (has no administration in time range)  acetaminophen (TYLENOL) tablet 650 mg (has no administration in time range)    Or  acetaminophen (TYLENOL) suppository 650 mg (has no administration in time range)  ondansetron (ZOFRAN) tablet 4 mg (has no administration in time range)    Or  ondansetron (ZOFRAN) injection 4 mg (has no administration in time range)  ipratropium (ATROVENT) nebulizer solution 0.5 mg (has no administration in time range)  albuterol (PROVENTIL) (2.5 MG/3ML) 0.083% nebulizer solution 2.5 mg (has no administration in time range)  albuterol (PROVENTIL) (2.5 MG/3ML) 0.083% nebulizer solution 2.5 mg (has no administration in time range)  methylPREDNISolone sodium succinate (SOLU-MEDROL) 40 mg/mL injection 40 mg (has no administration in time range)    Followed by  predniSONE (DELTASONE) tablet 40 mg (has no administration in time range)  rosuvastatin (CRESTOR) tablet 20 mg (has no administration in time range)  potassium chloride SA (KLOR-CON) CR tablet 20 mEq (has no administration in time range)  furosemide (LASIX) tablet 20 mg (has no administration in time range)  famotidine (PEPCID) tablet 20 mg (has no administration in time range)  docusate sodium (COLACE) capsule 100 mg (has no administration in time range)  dextromethorphan-guaiFENesin (MUCINEX DM) 30-600 MG per 12 hr tablet 1 tablet (has no administration in time range)  cholecalciferol (VITAMIN D3) tablet 2,000  Units (has no administration in time range)  aspirin EC tablet 81 mg (has no administration in time range)  methylPREDNISolone sodium succinate (SOLU-MEDROL) 125 mg/2 mL injection 125 mg (125 mg Intravenous Given 09/03/20 0055)  levofloxacin (LEVAQUIN) IVPB 500 mg (0 mg Intravenous Stopped 09/03/20 0304)    ED Course  I have reviewed the triage vital signs and the nursing notes.  Pertinent labs & imaging results that were available during my care of the patient were reviewed by me and considered in my medical decision  making (see chart for details).    MDM Rules/Calculators/A&P                          COPD exacerbation.  Given multiple duo nebs with some improvement but still pretty tachypneic.  Patient still with diminished breath sounds and wheezing but improved.  Will institute continuous albuterol for the same.  Discussed with hospitalist who will admit.  Levaquin and steroids already given.  Covid negative.  Final Clinical Impression(s) / ED Diagnoses Final diagnoses:  COPD exacerbation [facility])    Rx / DC Orders ED Discharge Orders    None       Habiba Treloar, Corene Cornea, MD 09/03/20 [phone]

## 2020-09-03 NOTE — Progress Notes (Signed)
   09/03/20 1612  Vitals  Temp (!) 97.5 F (36.4 C)  Temp Source Axillary  BP 97/81  MAP (mmHg) 89  BP Location Left Arm  BP Method Automatic  Patient Position (if appropriate) Lying  Pulse Rate (!) 116  Pulse Rate Source Monitor  ECG Heart Rate (!) 114  Resp 20  Level of Consciousness  Level of Consciousness Alert  MEWS COLOR  MEWS Score Color Yellow  Oxygen Therapy  SpO2 100 %  O2 Device Bi-PAP  Pain Assessment  Pain Scale 0-10  Pain Score 0  PCA/Epidural/Spinal Assessment  Respiratory Pattern Regular;Unlabored  Glasgow Coma Scale  Eye Opening 4  Best Verbal Response (NON-intubated) 5  Best Motor Response 6  Glasgow Coma Scale Score 15  MEWS Score  MEWS Temp 0  MEWS Systolic 1  MEWS Pulse 2  MEWS RR 0  MEWS LOC 0  MEWS Score 3    Patient has arrived onto the unit from the ED. Patient has BIPAP on due to elevated CO2 levels. Did not want [place] to remove the BIPAP to retrieve oral temp. Patient is ST on the monitor.

## 2020-09-03 NOTE — ED Notes (Addendum)
resp was called

## 2020-09-03 NOTE — ED Notes (Signed)
md at bedside

## 2020-09-03 NOTE — ED Notes (Signed)
Pt is on cardiac monitor

## 2020-09-03 NOTE — Progress Notes (Signed)
PT Cancellation Note  Patient Details Name: Anthony Nelson MRN: [id number] DOB: Nov 29, 1960   Cancelled Treatment:    Reason Eval/Treat Not Completed: Patient not medically ready at this time. Pt on BiPAP with increasing O2 needs through day. PT will continue to follow and evaluate as apropriate.   Hardie Pulley, DPT   Acute Rehabilitation Department Pager #: [phone]   Otho Bellows 09/03/2020, 12:52 PM

## 2020-09-04 LAB — BLOOD GAS, ARTERIAL
Acid-Base Excess: 22.3 mmol/L — ABNORMAL HIGH (ref 0.0–2.0)
Acid-Base Excess: 18.9 mmol/L — ABNORMAL HIGH (ref 0.0–2.0)
Bicarbonate: 49.4 mmol/L — ABNORMAL HIGH (ref 20.0–28.0)
Bicarbonate: 44.4 mmol/L — ABNORMAL HIGH (ref 20.0–28.0)
Drawn by: 60057
Drawn by: 54887
FIO2: 50
FIO2: 50
O2 Saturation: 97.8 %
O2 Saturation: 99.2 %
Patient temperature: 36.6
Patient temperature: 37
pCO2 arterial: 94.4 mmHg (ref 32.0–48.0)
pCO2 arterial: 65.6 mmHg (ref 32.0–48.0)
pH, Arterial: 7.336 — ABNORMAL LOW (ref 7.350–7.450)
pH, Arterial: 7.446 (ref 7.350–7.450)
pO2, Arterial: 104 mmHg (ref 83.0–108.0)
pO2, Arterial: 148 mmHg — ABNORMAL HIGH (ref 83.0–108.0)

## 2020-09-04 LAB — BASIC METABOLIC PANEL
Anion gap: 9 (ref 5–15)
BUN: 23 mg/dL — ABNORMAL HIGH (ref 6–20)
CO2: 44 mmol/L — ABNORMAL HIGH (ref 22–32)
Calcium: 9.2 mg/dL (ref 8.9–10.3)
Chloride: 85 mmol/L — ABNORMAL LOW (ref 98–111)
Creatinine, Ser: 0.69 mg/dL (ref 0.61–1.24)
GFR, Estimated: >60 mL/min (ref >60)
Glucose, Bld: 134 mg/dL — ABNORMAL HIGH (ref 70–99)
Potassium: 4.3 mmol/L (ref 3.5–5.1)
Sodium: 138 mmol/L (ref 135–145)

## 2020-09-04 LAB — CBC
HCT: 40 % (ref 39.0–52.0)
Hemoglobin: 12.4 g/dL — ABNORMAL LOW (ref 13.0–17.0)
MCH: 31.4 pg (ref 26.0–34.0)
MCHC: 31 g/dL (ref 30.0–36.0)
MCV: 101.3 fL — ABNORMAL HIGH (ref 80.0–100.0)
Platelets: 203 10*3/uL (ref 150–400)
RBC: 3.95 MIL/uL — ABNORMAL LOW (ref 4.22–5.81)
RDW: 11.7 % (ref 11.5–15.5)
WBC: 10.3 10*3/uL (ref 4.0–10.5)
nRBC: 0 % (ref 0.0–0.2)

## 2020-09-04 LAB — PROCALCITONIN: Procalcitonin: <0.1 ng/mL

## 2020-09-04 LAB — MAGNESIUM: Magnesium: 2.1 mg/dL (ref 1.7–2.4)

## 2020-09-04 LAB — HIV ANTIBODY (ROUTINE TESTING W REFLEX): HIV Screen 4th Generation wRfx: NONREACTIVE

## 2020-09-04 MED ORDER — ADULT MULTIVITAMIN W/MINERALS CH
1.0000 | ORAL_TABLET | Freq: Every day | ORAL | Status: DC
  Administered 2020-09-06 – 2020-09-07 (×2): 1 via ORAL
  Filled 2020-09-04 (×2): qty 1

## 2020-09-04 MED ORDER — SODIUM CHLORIDE 0.9 % IV SOLN
INTRAVENOUS | Status: DC | PRN
  Administered 2020-09-04: 250 mL via INTRAVENOUS
  Administered 2020-09-05: 1000 mL via INTRAVENOUS

## 2020-09-04 MED ORDER — ENSURE ENLIVE PO LIQD
237.0000 mL | Freq: Three times a day (TID) | ORAL | Status: DC
  Administered 2020-09-05 – 2020-09-07 (×6): 237 mL via ORAL
  Filled 2020-09-04: qty 237

## 2020-09-04 NOTE — Progress Notes (Signed)
09/03/20 1940  Assess: MEWS Score  Temp (!) 97.5 F (36.4 C)  BP 102/68  Pulse Rate (!) 111  ECG Heart Rate (!) 111  Level of Consciousness Alert  SpO2 97 %  O2 Device Bi-PAP  FiO2 (%) 50 %  Assess: MEWS Score  MEWS Temp 0  MEWS Systolic 0  MEWS Pulse 2  MEWS RR 0  MEWS LOC 0  MEWS Score 2  MEWS Score Color Yellow  Assess: if the MEWS score is Yellow or Red  Were vital signs taken at a resting state? Yes  Focused Assessment No change from prior assessment  Early Detection of Sepsis Score *See Row Information* Low  MEWS guidelines implemented *See Row Information* No, previously yellow, continue vital signs every 4 hours  Treat  Pain Score 0  Notify: Charge Nurse/RN  Name of Charge Nurse/RN Notified [facility]  Date Charge Nurse/RN Notified 09/04/20  Time Charge Nurse/RN Notified 2000   -pt sometimes not tolerating BIPAP. Eduacted and reminded, pt complaint. Pt alert and oriented -will monitor

## 2020-09-04 NOTE — Progress Notes (Signed)
Critical lab value of pCO2: 65.6.  Received by lab at 1354 on 09/04/2020. [place] DO paged and verbal orders received to take patient off of BiPAP and place on nasal cannula- if patient falls asleep, place back on BiPAP. RN will implement orders.

## 2020-09-04 NOTE — Progress Notes (Signed)
Pt was taken off of BIPAP and placed on 3 L [place]. Pt is tolerating well at this time. Pt is stable at this time. RT will monitor.

## 2020-09-04 NOTE — Progress Notes (Signed)
   09/04/20 1500  Vitals  Pulse Rate (!) 147  ECG Heart Rate (!) 144  MEWS COLOR  MEWS Score Color Yellow  Oxygen Therapy  SpO2 (!) 70 %  O2 Device Nasal Cannula  O2 Flow Rate (L/min) 5 L/min  MEWS Score  MEWS Temp 0  MEWS Systolic 0  MEWS Pulse 3  MEWS RR 0  MEWS LOC 0  MEWS Score 3   Upon entering patient's room, patient's oxygen saturation was 65-70% on nasal canula at 5L. Patient alert and oriented X4, and was immediately placed back on BiPAP. Current oxygen saturation is 98% on BiPAP and heart rate is 109BPM. No signs of distress noted at this time.

## 2020-09-04 NOTE — Progress Notes (Signed)
PT Cancellation Note  Patient Details Name: Anthony Nelson MRN: [id number] DOB: [birth year]   Cancelled Treatment:    Reason Eval/Treat Not Completed: Patient not medically ready Per RN, pt still on BiPAP and not appropriate to work with PT. Will hold until pt medically appropriate and follow up as schedule allows.   Lou Miner, DPT  Acute Rehabilitation Services  Pager: [phone] Office: [phone]    Rudean Hitt 09/04/2020, 12:34 PM

## 2020-09-04 NOTE — Progress Notes (Addendum)
OT Cancellation Note  Patient Details Name: Lerone Onder MRN: [id number] DOB: 1961-02-23   Cancelled Treatment:    Reason Eval/Treat Not Completed: Patient not medically ready. Pt remains on BiPAP. Per conversation with nurse, focus today remains on stabilizing/improving respiratory status. Plan to reattempt tomorrow.   Tyrone Schimke, OT Acute Rehabilitation Services Pager: [phone] Office: [phone]  09/04/2020, 11:53 AM

## 2020-09-04 NOTE — Progress Notes (Signed)
PROGRESS NOTE    Anthony Nelson  ZHG:[id number] DOB: [birth year] DOA: 09/02/2020 PCP: Elsie Stain, MD    Brief Narrative:  Anthony Nelson is a 60 year old male with past medical history significant for chronic hypoxic respiratory failure, COPD, chronic diastolic congestive heart failure, GERD, pulmonary hypertension who presented to the emergency department with progressive dyspnea associated with whitish sputum, cough and increasing oxygen requirement.  Patient denied fever, chills, sore throat, nausea, vomiting, chest pain, abdominal pain, diarrhea, constipation, dysuria at time of admission.  In the ED, temperature 98.5 F, HR 90, RR 22, BP 143/90, SPO2 98% on 4 L nasal cannula.,  Potassium 4.1, chloride 85, CO2 42, glucose 133, BUN 10, creatinine 0.62.  He is within normal limits.  SARS-CoV-2/Covid-19 test negative.  X-ray with pulmonary hyperinflation consistent with COPD, no consolidation appreciated.  NSR, normal rate, no concerning ST elevation/depression or T wave inversion.  Bronchodilators, Levaquin 500 mg IV and Solu-Medrol 125 mg IV by EDP.  Hospital service consulted for further evaluation and management of acute on chronic respiratory failure secondary to COPD exacerbation.   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  Patient's vital signs were stable.  Nursing and respiratory called to bedside stat.  Stat ABG w/ PH 7.11, PaCO2 >110, PaO2 379.  EKG with  NSR with no concerning dynamic changes. Troponin within normal limits. Etiology likely secondary to hypercarbia. --Continue treatment as below  Acute on chronic hypoxic hypercapnic respiratory failure Pulmonary hypertension COPD exacerbation Patient presenting to the ED with progressive shortness of breath.  No concerning consolidation on chest x-ray.  ABG with pH 7.11/PaCO2>110/PaO2 379. --ABG this am PH 7.33/PaCO2 94/PaO2104 --Repeat ABG this afternoon, hopeful for transitioning off BiPAP this afternoon --Albuterol neb every 6 hours scheduled; every 4 hours as needed wheezing --Ipratropium neb every 6 hours --Solu-Medrol now transitioned to prednisone 40m PO daily --Doxycycline 100 mg IV every 12 hours --Continue supplemental oxygen, maintain SPO2 greater than 88% --Supportive care --Repeat ABG this afternoon --If further decompensates, will discuss with PCCM for transfer and intubation  Chronic diastolic congestive heart failure, compensated 03/09/2020 with LVEF 60 to 65%, no LV regional wall motion abnormalities, RV systolic function severely reduced, RV severely enlarged, trivial MR, moderate TR, no aortic stenosis, IVC dilated.  Patient on furosemide 20 mg p.o. daily at home.  No concerning signs or volume overload. --Hold home furosemide for now --Strict I's and O's and daily weights  Hyperlipidemia: Holding home Crestor 20 mg p.o. daily until off bipap  Macrocytic anemia Hemoglobin 14.2, MCV 102.1 stable.  Atherosclerosis of the aorta Holding home aspirin and statin until off bipap  GERD Pepcid 20 mg twice daily at home. Holding oral medications until off bipap --Protonix 40 mg IV every 24 hours   DVT prophylaxis: Lovenox   Code Status: Full Code Family Communication: Updated patient extensively at bedside, no family present this morning.  Disposition Plan:  Level of care: Progressive Status is: Inpatient  Remains inpatient appropriate because:Altered mental status,  Ongoing  diagnostic testing needed not appropriate for outpatient work up, Unsafe d/c plan, IV treatments appropriate due to intensity of illness or inability to take PO and Inpatient level of care appropriate due to severity of illness   Dispo: The patient is from: Home              Anticipated d/c is to: Home              Patient currently is not medically stable to d/c.   Difficult to place patient No   Consultants:   None  Procedures:   BiPAP  Antimicrobials:   Levaquin 3/5 - 3/5  Doxycycline 3/5>>   Subjective: Patient seen and examined at bedside. Patient resting comfortably on BiPAP. Alert and oriented. Remains with elevated PaCO2 of 91. Discussed with patient will continue BiPAP and reassess this afternoon. No other questions or concerns at this time. Denies headache, no fever/chills/night sweats, no nausea/vomiting/diarrhea, no chest pain, no abdominal pain. No acute events overnight per nursing staff.   Objective: Vitals:   09/04/20 0256 09/04/20 0400 09/04/20 0731 09/04/20 0823  BP:  117/74 117/74 103/87  Pulse: (!) 103 (!) 107  (!) 104  Resp: 16 18 (!) 23 19  Temp:  98 F (36.7 C)    TempSrc:      SpO2: 99% 96%    Weight:      Height:        Intake/Output Summary (Last 24 hours) at 09/04/2020 1252 Last data filed at 09/04/2020 0934 Gross per 24 hour  Intake 250.8 ml  Output 250 ml  Net 0.8 ml   Filed Weights   09/02/20 2358 09/04/20 0100  Weight: 58.6 kg 54.1 kg    Examination:  General exam: Alert, NAD Respiratory system: Decreased breath sounds bilateral bases, no crackles, mild late expiratory wheezes, on BiPAP 50% FiO2 Cardiovascular system: S1 & S2 heard, RRR. No JVD, murmurs, rubs, gallops or clicks. No pedal edema. Gastrointestinal system: Abdomen is nondistended, soft and nontender. No organomegaly or masses felt. Normal bowel sounds heard. Central nervous system: No focal neurological deficits, cranial nerves II through XII grossly  intact Extremities: No peripheral edema Skin: No rashes, lesions or ulcers Psychiatry: Judgment/insight normal. Normal mood/affect.    Data Reviewed: I have personally reviewed following labs and imaging studies  CBC: Recent Labs  Lab 09/03/20 0010 09/03/20 0804 09/04/20 0519  WBC 6.4 6.1 10.3  NEUTROABS 4.7  --   --   HGB 14.2 14.3 12.4*  HCT 45.6 44.5 40.0  MCV 102.7* 102.1* 101.3*  PLT 206 212 208   Basic Metabolic Panel: Recent Labs  Lab 09/03/20 0010 09/03/20 0804 09/04/20 0519  NA 135 136 138  K 4.1 4.9 4.3  CL 85* 84* 85*  CO2 42* 44* 44*  GLUCOSE 133* 162* 134*  BUN 10 13 23*  CREATININE 0.62 0.59* 0.69  CALCIUM 9.2 9.3 9.2  MG  --   --  2.1   GFR: Estimated Creatinine Clearance: 75.1 mL/min (by C-G formula based on SCr of 0.69 mg/dL). Liver Function Tests: Recent Labs  Lab 09/03/20 0010 09/03/20 0804  AST 24 26  ALT 27 29  ALKPHOS 56 58  BILITOT 0.6 0.6  PROT 7.8 7.9  ALBUMIN 3.8 3.7   No results for input(s): LIPASE, AMYLASE in the last 168 hours. No results for input(s): AMMONIA in the last 168 hours. Coagulation Profile: No results for input(s): INR, PROTIME in the last 168 hours. Cardiac Enzymes: No results for input(s): CKTOTAL,  CKMB, CKMBINDEX, TROPONINI in the last 168 hours. BNP (last 3 results) No results for input(s): PROBNP in the last 8760 hours. HbA1C: No results for input(s): HGBA1C in the last 72 hours. CBG: Recent Labs  Lab 09/03/20 0801  GLUCAP 155*   Lipid Profile: No results for input(s): CHOL, HDL, LDLCALC, TRIG, CHOLHDL, LDLDIRECT in the last 72 hours. Thyroid Function Tests: No results for input(s): TSH, T4TOTAL, FREET4, T3FREE, THYROIDAB in the last 72 hours. Anemia Panel: No results for input(s): VITAMINB12, FOLATE, FERRITIN, TIBC, IRON, RETICCTPCT in the last 72 hours. Sepsis Labs: Recent Labs  Lab 09/03/20 0804 09/04/20 0519  PROCALCITON <0.10 <0.10    No results found for this or any previous visit  (from the past 240 hour(s)).       Radiology Studies: DG Chest Portable 1 View  Result Date: 09/03/2020 CLINICAL DATA:  Dyspnea EXAM: PORTABLE CHEST 1 VIEW COMPARISON:  03/09/2020, CT 11/26/2019 FINDINGS: The lungs are symmetrically hyperinflated in keeping with changes of underlying COPD. No pneumothorax or pleural effusion. Cardiac size within normal limits. Central pulmonary arteries appear mildly enlarged suggesting changes of pulmonary arterial hypertension. The pulmonary vascularity is otherwise unremarkable. IMPRESSION: Pulmonary hyperinflation in keeping with changes of underlying COPD. Central pulmonary arterial enlargement indicative of pulmonary artery hypertension. Electronically Signed   By: Fidela Salisbury MD   On: 09/03/2020 01:14        Scheduled Meds: . albuterol  2.5 mg Nebulization Q6H  . cholecalciferol  2,000 Units Oral Daily  . docusate sodium  100 mg Oral Daily  . enoxaparin (LOVENOX) injection  40 mg Subcutaneous Q24H  . feeding supplement  237 mL Oral TID BM  . ipratropium  0.5 mg Nebulization Q6H  . multivitamin with minerals  1 tablet Oral Daily  . pantoprazole (PROTONIX) IV  40 mg Intravenous Q24H  . potassium chloride SA  20 mEq Oral Daily  . predniSONE  40 mg Oral Q breakfast   Continuous Infusions: . sodium chloride 250 mL (09/04/20 0833)  . albuterol 10 mg/hr (09/03/20 0537)  . doxycycline (VIBRAMYCIN) IV 100 mg (09/04/20 0834)     LOS: 1 day    Time spent: 45 minutes spent on chart review, discussion with nursing staff, consultants, updating family and interview/physical exam; more than 50% of that time was spent in counseling and/or coordination of care.    Eric J [place] DO [facility] Available via Epic secure chat 7am-7pm After these hours, please refer to coverage provider listed on [email or web address] 09/04/2020, 12:52 PM

## 2020-09-04 NOTE — Progress Notes (Signed)
ABG sample collected and sent to Lab.

## 2020-09-04 NOTE — Progress Notes (Signed)
Initial Nutrition Assessment  DOCUMENTATION CODES:   Underweight  INTERVENTION:    Ensure Enlive po TID, each supplement provides 350 kcal and 20 grams of protein.  Liberalize diet to Regular.  MVI with minerals daily.  NUTRITION DIAGNOSIS:   Increased nutrient needs related to chronic illness (COPD, underweight) as evidenced by estimated needs.  GOAL:   Patient will meet greater than or equal to 90% of their needs  MONITOR:   PO intake,Supplement acceptance,Labs  REASON FOR ASSESSMENT:   Consult COPD Protocol  ASSESSMENT:   60 yo male admitted with COPD exacerbation. PMH includes COPD, CHF, GERD, pulmonary HTN, current smoker.  Patient is currently on BiPAP. PO meds not given this morning. Currently on a heart healthy diet. Meal intakes not recorded, suspect not eating due to BiPAP requirement.   Labs reviewed.  CBG: 155 yesterday  Medications reviewed and include cholecalciferol, Colace, potassium chloride, prednisone.  No significant weight changes noted on review of weight history. Patient is underweight with BMI=16.6. Suspect patient is malnourished, unable to obtain enough information at this time for identification of malnutrition.   NUTRITION - FOCUSED PHYSICAL EXAM:  unable to complete  Diet Order:   Diet Order            Diet Heart Room service appropriate? Yes; Fluid consistency: Thin  Diet effective now                 EDUCATION NEEDS:   Not appropriate for education at this time  Skin:  Skin Assessment: Reviewed RN Assessment  Last BM:  3/6  Height:   Ht Readings from Last 1 Encounters:  09/02/20 5' 11" (1.803 m)    Weight:   Wt Readings from Last 1 Encounters:  09/04/20 54.1 kg    Ideal Body Weight:  78.2 kg  BMI:  Body mass index is 16.63 kg/m.  Estimated Nutritional Needs:   Kcal:  1900-2100  Protein:  85-100 gm  Fluid:  1.8-2 L    Lucas Mallow, RD, LDN, CNSC Please refer to Amion for contact information.

## 2020-09-05 ENCOUNTER — Encounter: Payer: Self-pay | Admitting: Internal Medicine

## 2020-09-05 LAB — PROCALCITONIN: Procalcitonin: <0.1 ng/mL

## 2020-09-05 LAB — BASIC METABOLIC PANEL
Anion gap: 12 (ref 5–15)
BUN: 24 mg/dL — ABNORMAL HIGH (ref 6–20)
CO2: 37 mmol/L — ABNORMAL HIGH (ref 22–32)
Calcium: 9 mg/dL (ref 8.9–10.3)
Chloride: 90 mmol/L — ABNORMAL LOW (ref 98–111)
Creatinine, Ser: 0.65 mg/dL (ref 0.61–1.24)
GFR, Estimated: >60 mL/min (ref >60)
Glucose, Bld: 83 mg/dL (ref 70–99)
Potassium: 3.9 mmol/L (ref 3.5–5.1)
Sodium: 139 mmol/L (ref 135–145)

## 2020-09-05 LAB — CBC
HCT: 36.8 % — ABNORMAL LOW (ref 39.0–52.0)
Hemoglobin: 12 g/dL — ABNORMAL LOW (ref 13.0–17.0)
MCH: 32.4 pg (ref 26.0–34.0)
MCHC: 32.6 g/dL (ref 30.0–36.0)
MCV: 99.5 fL (ref 80.0–100.0)
Platelets: 205 10*3/uL (ref 150–400)
RBC: 3.7 MIL/uL — ABNORMAL LOW (ref 4.22–5.81)
RDW: 11.8 % (ref 11.5–15.5)
WBC: 8.7 10*3/uL (ref 4.0–10.5)
nRBC: 0 % (ref 0.0–0.2)

## 2020-09-05 LAB — MAGNESIUM: Magnesium: 2 mg/dL (ref 1.7–2.4)

## 2020-09-05 MED ORDER — METHYLPREDNISOLONE SODIUM SUCC 40 MG IJ SOLR
40.0000 mg | Freq: Two times a day (BID) | INTRAMUSCULAR | Status: DC
  Administered 2020-09-05 – 2020-09-06 (×3): 40 mg via INTRAVENOUS
  Filled 2020-09-05 (×3): qty 1

## 2020-09-05 MED ORDER — ARFORMOTEROL TARTRATE 15 MCG/2ML IN NEBU
15.0000 ug | INHALATION_SOLUTION | Freq: Two times a day (BID) | RESPIRATORY_TRACT | Status: DC
  Administered 2020-09-05 – 2020-09-07 (×5): 15 ug via RESPIRATORY_TRACT
  Filled 2020-09-05 (×5): qty 2

## 2020-09-05 MED ORDER — BUDESONIDE 0.25 MG/2ML IN SUSP
0.2500 mg | Freq: Two times a day (BID) | RESPIRATORY_TRACT | Status: DC
  Administered 2020-09-05 – 2020-09-07 (×5): 0.25 mg via RESPIRATORY_TRACT
  Filled 2020-09-05 (×5): qty 2

## 2020-09-05 NOTE — Progress Notes (Signed)
OT Cancellation Note  Patient Details Name: Anthony Nelson MRN: [id number] DOB: 12/21/1960   Cancelled Treatment:    Reason Eval/Treat Not Completed: Patient not medically ready (Pt back on Bipap.)  Malka So 09/05/2020, 8:34 AM  Nestor Lewandowsky, OTR/L Acute Rehabilitation Services Pager: [phone] Office: [phone]

## 2020-09-05 NOTE — Progress Notes (Addendum)
PROGRESS NOTE    Anthony Nelson  LDJ:[id number] DOB: [birth year] DOA: 09/02/2020 PCP: Elsie Stain, MD    Brief Narrative:  Anthony Nelson is a 60 year old male with past medical history significant for chronic hypoxic respiratory failure, COPD, chronic diastolic congestive heart failure, GERD, pulmonary hypertension who presented to the emergency department with progressive dyspnea associated with whitish sputum, cough and increasing oxygen requirement.  Patient denied fever, chills, sore throat, nausea, vomiting, chest pain, abdominal pain, diarrhea, constipation, dysuria at time of admission.  In the ED, temperature 98.5 F, HR 90, RR 22, BP 143/90, SPO2 98% on 4 L nasal cannula.,  Potassium 4.1, chloride 85, CO2 42, glucose 133, BUN 10, creatinine 0.62.  He is within normal limits.  SARS-CoV-2/Covid-19 test negative.  X-ray with pulmonary hyperinflation consistent with COPD, no consolidation appreciated.  NSR, normal rate, no concerning ST elevation/depression or T wave inversion.  Bronchodilators, Levaquin 500 mg IV and Solu-Medrol 125 mg IV by EDP.  Hospital service consulted for further evaluation and management of acute on chronic respiratory failure secondary to COPD exacerbation.   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  Patient's vital signs were stable.  Nursing and respiratory called to bedside stat.  Stat ABG w/ PH 7.11, PaCO2 >110, PaO2 379.  EKG with  NSR with no concerning dynamic changes. Troponin within normal limits. Etiology likely secondary to hypercarbia. --Continue treatment as below  Acute on chronic hypoxic hypercapnic respiratory failure Pulmonary hypertension COPD exacerbation Patient presenting to the ED with progressive shortness of breath.  No concerning consolidation on chest x-ray.  ABG with pH 7.11/PaCO2>110/PaO2 379. --Albuterol neb every 6 hours scheduled; every 4 hours as needed wheezing --Ipratropium neb every 6 hours --Solu-Medrol 25m IV q12h --Doxycycline 100 mg IV q12h --Continue supplemental oxygen, maintain SPO2 greater than 88%; on BiPAP this morning with FiO2 50%, will attempt to transition to high flow nasal cannula this morning --Continue BiPAP while sleeping and nocturnally --Supportive care  Chronic diastolic congestive heart failure, compensated 03/09/2020 with LVEF 60 to 65%, no LV regional wall motion abnormalities, RV systolic function severely reduced, RV severely enlarged, trivial MR, moderate TR, no aortic stenosis, IVC dilated.  Patient on furosemide 20 mg p.o. daily at home.  No concerning signs or volume overload. --Hold home furosemide for now --Strict I's and O's and daily weights  Hyperlipidemia: Holding home Crestor 20 mg p.o. daily until off bipap  Macrocytic anemia Hemoglobin 14.2, MCV 102.1 stable.  Atherosclerosis of the aorta Holding home aspirin and statin until off bipap  GERD Pepcid 20 mg twice daily at home. Holding oral medications until off bipap --Protonix 40 mg IV q24h   DVT prophylaxis: Lovenox   Code Status: Full Code Family Communication: No family present at bedside this morning, updated patient spouse via telephone  Disposition Plan:  Level of care: Progressive Status is: Inpatient  Remains inpatient appropriate because:Altered mental status, Ongoing diagnostic testing needed not appropriate for outpatient work up, Unsafe d/c plan, IV treatments appropriate  due to intensity of illness or inability to take PO and Inpatient level of care appropriate due to severity of illness   Dispo: The patient is from: Home              Anticipated d/c is to: Home              Patient currently is not medically stable to d/c.   Difficult to place patient No   Consultants:   None  Procedures:   BiPAP  Antimicrobials:   Levaquin 3/5 - 3/5  Doxycycline 3/5>>   Subjective: Patient seen and examined at bedside.  Resting comfortably, continues on BiPAP.  Hopeful to transition to high flow nasal cannula today. No other questions or concerns at this time. Denies headache, no fever/chills/night sweats, no nausea/vomiting/diarrhea, no chest pain, no abdominal pain. No acute events overnight per nursing staff.   Objective: Vitals:   09/05/20 0824 09/05/20 0828 09/05/20 0829 09/05/20 1001  BP:   113/78 113/78  Pulse:   99 (!) 101  Resp:   (!) 21 18  Temp:      TempSrc:      SpO2: 99% 99% 99% 97%  Weight:      Height:        Intake/Output Summary (Last 24 hours) at 09/05/2020 1007 Last data filed at 09/05/2020 0936 Gross per 24 hour  Intake 539.72 ml  Output 550 ml  Net -10.28 ml   Filed Weights   09/02/20 2358 09/04/20 0100 09/05/20 0145  Weight: 58.6 kg 54.1 kg 53 kg    Examination:  General exam: Alert, NAD Respiratory system: Decreased breath sounds bilateral bases, no crackles, mild late expiratory wheezes, on BiPAP 50% FiO2 Cardiovascular system: S1 & S2 heard, RRR. No JVD, murmurs, rubs, gallops or clicks. No pedal edema. Gastrointestinal system: Abdomen is nondistended, soft and nontender. No organomegaly or masses felt. Normal bowel sounds heard. Central nervous system: No focal neurological deficits, cranial nerves II through XII grossly intact Extremities: No peripheral edema Skin: No rashes, lesions or ulcers Psychiatry: Judgment/insight normal. Normal mood/affect.    Data Reviewed: I have personally reviewed following labs  and imaging studies  CBC: Recent Labs  Lab 09/03/20 0010 09/03/20 0804 09/04/20 0519 09/05/20 0503  WBC 6.4 6.1 10.3 8.7  NEUTROABS 4.7  --   --   --   HGB 14.2 14.3 12.4* 12.0*  HCT 45.6 44.5 40.0 36.8*  MCV 102.7* 102.1* 101.3* 99.5  PLT 206 212 203 476   Basic Metabolic Panel: Recent Labs  Lab 09/03/20 0010 09/03/20 0804 09/04/20 0519 09/05/20 0503  NA 135 136 138 139  K 4.1 4.9 4.3 3.9  CL 85* 84* 85* 90*  CO2 42* 44* 44* 37*  GLUCOSE 133* 162* 134* 83  BUN 10 13 23* 24*  CREATININE 0.62 0.59* 0.69 0.65  CALCIUM 9.2 9.3 9.2 9.0  MG  --   --  2.1 2.0   GFR: Estimated Creatinine Clearance: 73.6 mL/min (by C-G formula based on SCr of 0.65 mg/dL). Liver Function Tests: Recent Labs  Lab 09/03/20 0010 09/03/20 0804  AST 24 26  ALT 27 29  ALKPHOS 56 58  BILITOT 0.6 0.6  PROT 7.8 7.9  ALBUMIN 3.8 3.7   No results for input(s): LIPASE, AMYLASE in the last 168 hours. No results for input(s): AMMONIA in the last 168 hours. Coagulation Profile: No results for input(s): INR, PROTIME in the last 168 hours. Cardiac Enzymes: No results for input(s): CKTOTAL, CKMB, CKMBINDEX, TROPONINI  in the last 168 hours. BNP (last 3 results) No results for input(s): PROBNP in the last 8760 hours. HbA1C: No results for input(s): HGBA1C in the last 72 hours. CBG: Recent Labs  Lab 09/03/20 0801  GLUCAP 155*   Lipid Profile: No results for input(s): CHOL, HDL, LDLCALC, TRIG, CHOLHDL, LDLDIRECT in the last 72 hours. Thyroid Function Tests: No results for input(s): TSH, T4TOTAL, FREET4, T3FREE, THYROIDAB in the last 72 hours. Anemia Panel: No results for input(s): VITAMINB12, FOLATE, FERRITIN, TIBC, IRON, RETICCTPCT in the last 72 hours. Sepsis Labs: Recent Labs  Lab 09/03/20 0804 09/04/20 0519 09/05/20 0503  PROCALCITON <0.10 <0.10 <0.10    No results found for this or any previous visit (from the past 240 hour(s)).       Radiology Studies: No results  found.      Scheduled Meds: . albuterol  2.5 mg Nebulization Q6H  . arformoterol  15 mcg Nebulization BID  . budesonide (PULMICORT) nebulizer solution  0.25 mg Nebulization BID  . cholecalciferol  2,000 Units Oral Daily  . docusate sodium  100 mg Oral Daily  . enoxaparin (LOVENOX) injection  40 mg Subcutaneous Q24H  . feeding supplement  237 mL Oral TID BM  . ipratropium  0.5 mg Nebulization Q6H  . methylPREDNISolone (SOLU-MEDROL) injection  40 mg Intravenous Q12H  . multivitamin with minerals  1 tablet Oral Daily  . pantoprazole (PROTONIX) IV  40 mg Intravenous Q24H  . potassium chloride SA  20 mEq Oral Daily   Continuous Infusions: . sodium chloride 250 mL (09/04/20 0833)  . albuterol 10 mg/hr (09/03/20 0537)  . doxycycline (VIBRAMYCIN) IV 100 mg (09/04/20 2210)     LOS: 2 days    Time spent: 38 minutes spent on chart review, discussion with nursing staff, consultants, updating family and interview/physical exam; more than 50% of that time was spent in counseling and/or coordination of care.    Zackaria Burkey J [place] DO [facility] Available via Epic secure chat 7am-7pm After these hours, please refer to coverage provider listed on [email or web address] 09/05/2020, 10:07 AM

## 2020-09-05 NOTE — Progress Notes (Signed)
Nurse spoke with pt's spouse regarding plan of care with pt's permission.

## 2020-09-05 NOTE — Evaluation (Signed)
Occupational Therapy Evaluation Patient Details Name: Anthony Nelson MRN: [id number] DOB: 04-10-1961 Today's Date: 09/05/2020    History of Present Illness Patient is a 60 y/o male who presents on 09/03/20 with SOB and cough. Found to have acute respiratory failure with hypoxia and hypercapnia due to COPD exacerbation. PMH includes pulmonary HTN, diastolic HF, bronchitis.   Clinical Impression   Pt was independent prior to admission. Report enjoying spending time with his 3 grandchildren. Pt presents with mild standing balance deficit and decreased activity tolerance. Pt with resting HR of 123, increased to 130 with standing. Pt currently dependent on 8L 02 to maintain Sp02 above 90%. Pt likely to progress well. Will follow for ADL training, to increase endurance and instruct in energy conservation strategies.     Follow Up Recommendations  No OT follow up    Equipment Recommendations  None recommended by OT    Recommendations for Other Services       Precautions / Restrictions Precautions Precautions: Fall;Other (comment) Precaution Comments: watch 02/HR Restrictions Weight Bearing Restrictions: No      Mobility Bed Mobility Overal bed mobility: Modified Independent             General bed mobility comments: HOB elevated, use of rails, no assist.    Transfers Overall transfer level: Needs assistance Equipment used: None Transfers: Sit to/from Stand Sit to Stand: Min guard         General transfer comment: min guard for safety, some leaning on bed for stability while using urinal    Balance Overall balance assessment: Needs assistance Sitting-balance support: Feet supported;No upper extremity supported Sitting balance-Leahy Scale: Good     Standing balance support: During functional activity Standing balance-Leahy Scale: Fair Standing balance comment: Min guard for safety in standing due to slight posterior lean.                           ADL either  performed or assessed with clinical judgement   ADL Overall ADL's : Needs assistance/impaired Eating/Feeding: Independent   Grooming: Wash/dry hands;Sitting;Set up   Upper Body Bathing: Set up;Sitting   Lower Body Bathing: Min guard;Sit to/from stand   Upper Body Dressing : Set up;Sitting   Lower Body Dressing: Min guard;Sit to/from stand   Toilet Transfer: Magazine features editor Details (indicate cue type and reason): stood to use urinal Toileting- Water quality scientist and Hygiene: Min guard;Sit to/from stand         General ADL Comments: pt with poor activity tolerance, instructed in pursed lip breathing     Vision Patient Visual Report: No change from baseline       Perception     Praxis      Pertinent Vitals/Pain Pain Assessment: No/denies pain     Hand Dominance Right   Extremity/Trunk Assessment Upper Extremity Assessment Upper Extremity Assessment: Overall WFL for tasks assessed   Lower Extremity Assessment Lower Extremity Assessment: Defer to PT evaluation       Communication Communication Communication: No difficulties   Cognition Arousal/Alertness: Awake/alert Behavior During Therapy: WFL for tasks assessed/performed Overall Cognitive Status: Within Functional Limits for tasks assessed                                    General Comments      Exercises     Shoulder Instructions      Home Living Family/patient expects  to be discharged to:: Private residence Living Arrangements: Spouse/significant other;Children Available Help at Discharge: Family;Available PRN/intermittently Type of Home: House Home Access: Stairs to enter [employer] of Steps: 3 Entrance Stairs-Rails: None Home Layout: One level     Bathroom Shower/Tub: Tub/shower unit;Curtain   Bathroom Toilet: Standard Bathroom Accessibility: Yes   Home Equipment: Hand held shower head;Shower seat          Prior Functioning/Environment Level  of Independence: Independent with assistive device(s)        Comments: Wears 2L 02 at home PRN. DOes ADLS/IADLs. does not drive. Limited to household ambulation as of recently. Sits to shower.        OT Problem List: Decreased activity tolerance;Impaired balance (sitting and/or standing);Cardiopulmonary status limiting activity      OT Treatment/Interventions: Self-care/ADL training;Energy conservation;Patient/family education;Balance training    OT Goals(Current goals can be found in the care plan section) Acute Rehab OT Goals Patient Stated Goal: to play with his grandchildren OT Goal Formulation: With patient Time For Goal Achievement: 09/19/20 Potential to Achieve Goals: Good ADL Goals Additional ADL Goal #1: Pt will perform basic ADL modified independently. Additional ADL Goal #2: Pt will incorporate energy conservation and breathing techniques in ADL and mobility independently.  OT Frequency: Min 2X/week   Barriers to D/C:            Co-evaluation              AM-PAC OT "6 Clicks" Daily Activity     Outcome Measure Help from another person eating meals?: A Little Help from another person taking care of personal grooming?: A Little Help from another person toileting, which includes using toliet, bedpan, or urinal?: A Little Help from another person bathing (including washing, rinsing, drying)?: A Little Help from another person to put on and taking off regular upper body clothing?: A Little Help from another person to put on and taking off regular lower body clothing?: A Little 6 Click Score: 18   End of Session Equipment Utilized During Treatment: Oxygen  Activity Tolerance: Patient limited by fatigue Patient left: in bed;with call bell/phone within reach  OT Visit Diagnosis: Unsteadiness on feet (R26.81) (decreased activity tolerance)                Time: 2060-1561 OT Time Calculation (min): 17 min Charges:  OT General Charges $OT Visit: 1 Visit OT  Evaluation $OT Eval Moderate Complexity: 1 Mod  Nestor Lewandowsky, OTR/L Acute Rehabilitation Services Pager: [phone] Office: [phone]  Malka So 09/05/2020, 3:42 PM

## 2020-09-05 NOTE — Progress Notes (Signed)
   09/05/20 1600  Vitals  Temp 98.3 F (36.8 C)  Temp Source Oral  BP 132/82  MAP (mmHg) 94  BP Location Right Arm  BP Method Automatic  Patient Position (if appropriate) Lying  Pulse Rate (!) 110  Pulse Rate Source Monitor  ECG Heart Rate (!) 106  Resp 15  MEWS COLOR  MEWS Score Color Green  Oxygen Therapy  SpO2 99 %  O2 Device HFNC  O2 Flow Rate (L/min) 6 L/min  MEWS Score  MEWS Temp 0  MEWS Systolic 0  MEWS Pulse 1  MEWS RR 0  MEWS LOC 0  MEWS Score 1   Pt has been in HFNC since 1000 today. Instructions from MD to place BIPAP back on when pt is asleep. Pt refuses to wear it at this time. Pt stated that he is breathing good.  MD was paged to make aware that pt refuses to wear BIPAP while sleeping at this time.   Orders to place pt on BIPAP for night time were given by MD.   Will make upcoming nurse aware.  Will continue to monitor.

## 2020-09-05 NOTE — Evaluation (Signed)
Physical Therapy Evaluation Patient Details Name: Anthony Nelson MRN: [id number] DOB: Sep 09, 1960 Today's Date: 09/05/2020   History of Present Illness  Patient is a 60 y/o male who presents on 09/03/20 with SOB and cough. Found to have acute respiratory failure with hypoxia and hypercapnia due to COPD exacerbation. PMH includes pulmonary HTN, diastolic HF, bronchitis.  Clinical Impression  Patient presents with dyspnea at rest, worsened with exertion, decreased activity tolerance, impaired endurance and impaired mobility s/p above. Pt sleepy upon arrival. Pt lives with family and reports being independent for ADLs/IADLs and walking PTA. Today, pt tolerated bed mobility and transfers supervision-Mod I. Able to take a few steps along side bed but fatigued quickly with 2-3/4 DOE. HR in 115 bpm at rest; increased to 130s bpm with minimal activity. Sp02 ranged from 90-95% on 8L HF [place]. Pt quickly falling asleep once returning to supine. Encouraged OOB to chair when feeling and breathing better to improve oxygenation. Likely will progress well when able to increase activity. Will follow acutely to maximize independence and mobility prior to return home.    Follow Up Recommendations No PT follow up;Supervision - Intermittent    Equipment Recommendations  None recommended by PT    Recommendations for Other Services       Precautions / Restrictions Precautions Precautions: Fall;Other (comment) Precaution Comments: watch 02/HR Restrictions Weight Bearing Restrictions: No      Mobility  Bed Mobility Overal bed mobility: Modified Independent             General bed mobility comments: HOB elevated, use of rails, no assist.    Transfers Overall transfer level: Needs assistance Equipment used: None Transfers: Sit to/from Stand Sit to Stand: Min guard         General transfer comment: Min guard for safety. Stood from EOB x1, posterior lean but no overt LOB. Took a few steps along side bed.  Declined transfer to chair or ambulation due to SOB/fatigue.  Ambulation/Gait                Stairs            Wheelchair Mobility    Modified Rankin (Stroke Patients Only)       Balance Overall balance assessment: Needs assistance Sitting-balance support: Feet supported;No upper extremity supported Sitting balance-Leahy Scale: Good     Standing balance support: During functional activity Standing balance-Leahy Scale: Fair Standing balance comment: Min guard for safety in standing due to slight posterior lean.                             Pertinent Vitals/Pain Pain Assessment: No/denies pain    Home Living Family/patient expects to be discharged to:: Private residence Living Arrangements: Spouse/significant other;Children Available Help at Discharge: Family;Available PRN/intermittently Type of Home: House Home Access: Stairs to enter Entrance Stairs-Rails: None Entrance Stairs-Number of Steps: 3 Home Layout: One level Home Equipment: Hand held shower head;Shower seat      Prior Function Level of Independence: Independent         Comments: Wears 2L 02 at home PRN. DOes ADLS/IADLs. does not drive. Limited to household ambulation as of recently.     Hand Dominance   Dominant Hand: Right    Extremity/Trunk Assessment   Upper Extremity Assessment Upper Extremity Assessment: Defer to OT evaluation    Lower Extremity Assessment Lower Extremity Assessment: Generalized weakness (but functional)       Communication   Communication: No difficulties  Cognition Arousal/Alertness: Awake/alert Behavior During Therapy: WFL for tasks assessed/performed Overall Cognitive Status: Within Functional Limits for tasks assessed                                 General Comments: for basic mobility tasks. needs repetition at times, appears sleepy.      General Comments General comments (skin integrity, edema, etc.): HR in 115 bpm at  rest; increased to 130s bpm with minimal activity. Sp02 ranged from 90-95% on 8L HF [place].    Exercises     Assessment/Plan    PT Assessment Patient needs continued PT services  PT Problem List Decreased strength;Decreased mobility;Decreased balance;Cardiopulmonary status limiting activity;Decreased activity tolerance       PT Treatment Interventions Therapeutic exercise;Gait training;Patient/family education;Therapeutic activities;Functional mobility training;Stair training;Balance training    PT Goals (Current goals can be found in the Care Plan section)  Acute Rehab PT Goals Patient Stated Goal: to get better PT Goal Formulation: With patient Time For Goal Achievement: 09/19/20 Potential to Achieve Goals: Good    Frequency Min 3X/week   Barriers to discharge        Co-evaluation               AM-PAC PT "6 Clicks" Mobility  Outcome Measure Help needed turning from your back to your side while in a flat bed without using bedrails?: None Help needed moving from lying on your back to sitting on the side of a flat bed without using bedrails?: None Help needed moving to and from a bed to a chair (including a wheelchair)?: A Little Help needed standing up from a chair using your arms (e.g., wheelchair or bedside chair)?: A Little Help needed to walk in hospital room?: A Little Help needed climbing 3-5 steps with a railing? : A Little 6 Click Score: 20    End of Session Equipment Utilized During Treatment: Oxygen Activity Tolerance: Patient limited by fatigue Patient left: in bed;with call bell/phone within reach;with bed alarm set Nurse Communication: Mobility status PT Visit Diagnosis: Difficulty in walking, not elsewhere classified (R26.2)    Time: 4854-6270 PT Time Calculation (min) (ACUTE ONLY): 13 min   Charges:   PT Evaluation $PT Eval Moderate Complexity: 1 Mod          Marisa Severin, PT, DPT Acute Rehabilitation Services Pager [phone] Office  [phone]      [place] 09/05/2020, 2:53 PM

## 2020-09-05 NOTE — Progress Notes (Signed)
PT Cancellation Note  Patient Details Name: Anthony Nelson MRN: [id number] DOB: 01-Jul-1961   Cancelled Treatment:    Reason Eval/Treat Not Completed: Patient not medically ready Pt continues to require BiPAP. Rn requesting to hold PT eval this AM; plan to transition to HF [place] some point this morning. Will follow for appropriateness.   Marguarite Arbour A Ollivander See 09/05/2020, 10:32 AM Marisa Severin, PT, DPT Acute Rehabilitation Services Pager [phone] Office [phone]

## 2020-09-05 NOTE — Progress Notes (Signed)
Heart Failure Navigator Progress Note  Assessed/screened for outpatient Heart & Vascular TOC clinic readiness. Unfortunately at this time the patient does not meet criteria due to pt obtunded, on BiPAP this AM. Will continue to follow for heart failure symptoms resulting from this stay, at this time issues appear to be more related to COPD/respiratory distress.    Navigator available for reassessment of patient.   Pricilla Holm, RN, BSN Heart Failure Nurse Navigator [phone]

## 2020-09-06 LAB — GLUCOSE, CAPILLARY: Glucose-Capillary: 196 mg/dL — ABNORMAL HIGH (ref 70–99)

## 2020-09-06 MED ORDER — IPRATROPIUM-ALBUTEROL 0.5-2.5 (3) MG/3ML IN SOLN
3.0000 mL | Freq: Three times a day (TID) | RESPIRATORY_TRACT | Status: DC
  Administered 2020-09-06 – 2020-09-07 (×3): 3 mL via RESPIRATORY_TRACT
  Filled 2020-09-06 (×3): qty 3

## 2020-09-06 MED ORDER — PANTOPRAZOLE SODIUM 40 MG PO TBEC
40.0000 mg | DELAYED_RELEASE_TABLET | Freq: Every day | ORAL | Status: DC
  Administered 2020-09-07: 40 mg via ORAL
  Filled 2020-09-06: qty 1

## 2020-09-06 MED ORDER — ALBUTEROL SULFATE (2.5 MG/3ML) 0.083% IN NEBU
2.5000 mg | INHALATION_SOLUTION | Freq: Three times a day (TID) | RESPIRATORY_TRACT | Status: DC
  Administered 2020-09-06: 2.5 mg via RESPIRATORY_TRACT
  Filled 2020-09-06: qty 3

## 2020-09-06 MED ORDER — ROSUVASTATIN CALCIUM 20 MG PO TABS
20.0000 mg | ORAL_TABLET | Freq: Every day | ORAL | Status: DC
  Administered 2020-09-06 – 2020-09-07 (×2): 20 mg via ORAL
  Filled 2020-09-06 (×2): qty 1

## 2020-09-06 MED ORDER — ASPIRIN EC 81 MG PO TBEC
81.0000 mg | DELAYED_RELEASE_TABLET | Freq: Every day | ORAL | Status: DC
  Administered 2020-09-06 – 2020-09-07 (×2): 81 mg via ORAL
  Filled 2020-09-06 (×2): qty 1

## 2020-09-06 MED ORDER — PREDNISONE 20 MG PO TABS
40.0000 mg | ORAL_TABLET | Freq: Every day | ORAL | Status: DC
  Administered 2020-09-07: 40 mg via ORAL
  Filled 2020-09-06: qty 2

## 2020-09-06 MED ORDER — FUROSEMIDE 20 MG PO TABS
20.0000 mg | ORAL_TABLET | Freq: Every day | ORAL | Status: DC
  Administered 2020-09-06 – 2020-09-07 (×2): 20 mg via ORAL
  Filled 2020-09-06 (×2): qty 1

## 2020-09-06 MED ORDER — DOXYCYCLINE HYCLATE 100 MG PO TABS
100.0000 mg | ORAL_TABLET | Freq: Two times a day (BID) | ORAL | Status: DC
  Administered 2020-09-06 – 2020-09-07 (×2): 100 mg via ORAL
  Filled 2020-09-06 (×2): qty 1

## 2020-09-06 MED ORDER — IPRATROPIUM BROMIDE 0.02 % IN SOLN
0.5000 mg | Freq: Three times a day (TID) | RESPIRATORY_TRACT | Status: DC
  Administered 2020-09-06: 0.5 mg via RESPIRATORY_TRACT
  Filled 2020-09-06: qty 2.5

## 2020-09-06 NOTE — Progress Notes (Signed)
Physical Therapy Treatment Patient Details Name: Anthony Nelson MRN: [id number] DOB: 1961/01/19 Today's Date: 09/06/2020    History of Present Illness Patient is a 60 y/o male who presents on 09/03/20 with SOB and cough. Found to have acute respiratory failure with hypoxia and hypercapnia due to COPD exacerbation. PMH includes pulmonary HTN, diastolic HF, bronchitis.    PT Comments    Patient progressing slowly towards PT goals. Tolerated short distance ambulation within room on 4-6L HF [place] with Sp02 initially staying >88%. After performing sit to stands from EOB on 4L HF [place], Sp02 dropped to 79% and took a few minutes to recover before it returned to >88% with cues for pursed lip breathing and rest. HR up to 130s bpm with activity. Pt noted to have shallow breaths using accessory muscles throughout session. Education re: energy conservation techniques, importance of short bouts of activity and trying to sit up EOB daily for all meals as well as standing/mobility as tolerated to improve oxygenation and ventilation. Will follow.   Follow Up Recommendations  No PT follow up;Supervision - Intermittent     Equipment Recommendations  None recommended by PT    Recommendations for Other Services       Precautions / Restrictions Precautions Precautions: Fall;Other (comment) Precaution Comments: watch 02/HR Restrictions Weight Bearing Restrictions: No    Mobility  Bed Mobility Overal bed mobility: Modified Independent             General bed mobility comments: HOB elevated, use of rails, no assist.    Transfers Overall transfer level: Needs assistance Equipment used: None Transfers: Sit to/from Stand Sit to Stand: Supervision         General transfer comment: Supervision for safety. Stood from EOB without difficulty.  Ambulation/Gait Ambulation/Gait assistance: Supervision Gait Distance (Feet): 50 Feet Assistive device: IV Pole Gait Pattern/deviations: Step-through  pattern;Decreased stride length Gait velocity: decreased   General Gait Details: Able to walk around room on 6L HF [place] holding onto IV pole. Accessory muscle use noted with shallow breaths. Sp02 remained >88% on 6L, dropped 02 to 4L and Sp02 dropped to 79% post session once returning to supine- took a few mins to recover to 90s, returned 02 to 3L HF 02 [place]   Stairs             Wheelchair Mobility    Modified Rankin (Stroke Patients Only)       Balance Overall balance assessment: No apparent balance deficits (not formally assessed)                                          Cognition Arousal/Alertness: Awake/alert Behavior During Therapy: WFL for tasks assessed/performed Overall Cognitive Status: Within Functional Limits for tasks assessed                                        Exercises Other Exercises Other Exercises: Sit to stand for strengthening/cardiovascular exercise x5 from EOB, rest breaks needed between bouts    General Comments General comments (skin integrity, edema, etc.): HR up to 130s bpm with activity. Sp02 ranged from 79-93% on 2L (starting 02) up to 6L H[place].      Pertinent Vitals/Pain Pain Assessment: No/denies pain    Home Living  Prior Function            PT Goals (current goals can now be found in the care plan section) Progress towards PT goals: Progressing toward goals    Frequency    Min 3X/week      PT Plan Current plan remains appropriate    Co-evaluation              AM-PAC PT "6 Clicks" Mobility   Outcome Measure  Help needed turning from your back to your side while in a flat bed without using bedrails?: None Help needed moving from lying on your back to sitting on the side of a flat bed without using bedrails?: None Help needed moving to and from a bed to a chair (including a wheelchair)?: A Little Help needed standing up from a chair using your arms  (e.g., wheelchair or bedside chair)?: A Little Help needed to walk in hospital room?: A Little Help needed climbing 3-5 steps with a railing? : A Little 6 Click Score: 20    End of Session Equipment Utilized During Treatment: Oxygen Activity Tolerance: Treatment limited secondary to medical complications (Comment);Patient limited by fatigue (drop in Sp02) Patient left: in bed;with call bell/phone within reach Nurse Communication: Mobility status PT Visit Diagnosis: Difficulty in walking, not elsewhere classified (R26.2)     Time: 3838-1840 PT Time Calculation (min) (ACUTE ONLY): 22 min  Charges:  $Therapeutic Exercise: 8-22 mins                     Marisa Severin, PT, DPT Acute Rehabilitation Services Pager [phone] Office [place] 09/06/2020, 12:40 PM

## 2020-09-06 NOTE — Consult Note (Signed)
[facility] Inpatient Consult   09/06/2020  Gregorio Worley 1961-02-16 [id number]   Managed Medicaid: [facility] is PCP  Patient discussed in Progression Meeting with inpatient [facility] team today. Assessed for  eligible for chronic care management needs.  Plan: Continue to follow progress and needs with inpatient [facility] team.  Natividad Brood, RN BSN [facility] Liaison  [phone] business mobile phone Toll free office [phone]  Fax number: [phone] [email or web address]

## 2020-09-06 NOTE — Progress Notes (Signed)
PROGRESS NOTE    Anthony Nelson  YNW:[id number] DOB: 06-23-1961 DOA: 09/02/2020 PCP: Elsie Stain, MD    Brief Narrative:  Anthony Nelson is a 60 year old male with past medical history significant for chronic hypoxic respiratory failure, COPD, chronic diastolic congestive heart failure, GERD, pulmonary hypertension who presented to the emergency department with progressive dyspnea associated with whitish sputum, cough and increasing oxygen requirement.  Patient denied fever, chills, sore throat, nausea, vomiting, chest pain, abdominal pain, diarrhea, constipation, dysuria at time of admission.  In the ED, temperature 98.5 F, HR 90, RR 22, BP 143/90, SPO2 98% on 4 L nasal cannula.,  Potassium 4.1, chloride 85, CO2 42, glucose 133, BUN 10, creatinine 0.62.  He is within normal limits.  SARS-CoV-2/Covid-19 test negative.  X-ray with pulmonary hyperinflation consistent with COPD, no consolidation appreciated.  NSR, normal rate, no concerning ST elevation/depression or T wave inversion.  Bronchodilators, Levaquin 500 mg IV and Solu-Medrol 125 mg IV by EDP.  Hospital service consulted for further evaluation and management of acute on chronic respiratory failure secondary to COPD exacerbation.   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  Patient's vital signs were stable.  Nursing and respiratory called to bedside stat.  Stat ABG w/ PH 7.11, PaCO2 >110, PaO2 379.  EKG with  NSR with no concerning dynamic changes. Troponin within normal limits. Etiology likely secondary to hypercarbia. --Continue treatment as below  Acute on chronic hypoxic hypercapnic respiratory failure Pulmonary hypertension COPD exacerbation Patient presenting to the ED with progressive shortness of breath.  No concerning consolidation on chest x-ray.  ABG with pH 7.11/PaCO2>110/PaO2 379. --Albuterol neb every 6 hours scheduled; every 4 hours as needed wheezing --Ipratropium neb every 6 hours --De-escalate Solu-Medrol to prednisone 40 mg p.o. daily --Doxycycline 100 mg PO q12h --Continue supplemental oxygen, maintain SPO2 greater than 88%; on 7LNC this am with baseline 2LNC --Continue BiPAP while sleeping and nocturnally; discussed need for compliance --Supportive care  Chronic diastolic congestive heart failure, compensated 03/09/2020 with LVEF 60 to 65%, no LV regional wall motion abnormalities, RV systolic function severely reduced, RV severely enlarged, trivial MR, moderate TR, no aortic stenosis, IVC dilated.  Patient on furosemide 20 mg p.o. daily at home.  No concerning signs or volume overload. --Resume home furosemide 20 mg p.o. daily --Strict I's and O's and daily weights  Hyperlipidemia: Crestor 20 mg p.o. daily  Macrocytic anemia Hemoglobin 14.2, MCV 102.1 stable.  Atherosclerosis of the aorta Continue aspirin and statin  GERD Pepcid 20 mg twice daily at home.  --Protonix 9m PO daily   DVT prophylaxis: Lovenox   Code Status: Full Code Family Communication: No family present at bedside this morning, updated patient spouse via telephone  Disposition Plan:  Level of care: Progressive Status is: Inpatient  Remains inpatient appropriate because:Altered mental status, Ongoing diagnostic testing needed not appropriate for outpatient work up, Unsafe d/c plan, IV treatments appropriate due to intensity of illness or inability to take PO and Inpatient level of care  appropriate due to severity of illness   Dispo: The patient is from: Home              Anticipated d/c is to: Home              Patient currently is not medically stable to d/c.   Difficult to place patient No   Consultants:   None  Procedures:   BiPAP  Antimicrobials:   Levaquin 3/5 - 3/5  Doxycycline 3/5>>   Subjective: Patient seen and examined at bedside.  Resting comfortably, BiPAP titrated off, on 7 L nasal cannula this morning. No other questions or concerns at this time. Denies headache, no fever/chills/night sweats, no nausea/vomiting/diarrhea, no chest pain, no abdominal pain. No acute events overnight per nursing staff.   Objective: Vitals:   09/06/20 0748 09/06/20 0749 09/06/20 0751 09/06/20 0800  BP:    101/72  Pulse:    85  Resp:    17  Temp:    98.2 F (36.8 C)  TempSrc:    Oral  SpO2: 97% 97% 99% 100%  Weight:      Height:        Intake/Output Summary (Last 24 hours) at 09/06/2020 1055 Last data filed at 09/06/2020 0800 Gross per 24 hour  Intake 1070.94 ml  Output 900 ml  Net 170.94 ml   Filed Weights   09/04/20 0100 09/05/20 0145 09/06/20 0043  Weight: 54.1 kg 53 kg 53.1 kg    Examination:  General exam: Alert, NAD Respiratory system: Decreased breath sounds bilateral bases, no crackles, mild late expiratory wheezes, on 7 L nasal cannula Cardiovascular system: S1 & S2 heard, RRR. No JVD, murmurs, rubs, gallops or clicks. No pedal edema. Gastrointestinal system: Abdomen is nondistended, soft and nontender. No organomegaly or masses felt. Normal bowel sounds heard. Central nervous system: No focal neurological deficits, cranial nerves II through XII grossly intact Extremities: No peripheral edema Skin: No rashes, lesions or ulcers Psychiatry: Judgment/insight normal. Normal mood/affect.    Data Reviewed: I have personally reviewed following labs and imaging studies  CBC: Recent Labs  Lab 09/03/20 0010 09/03/20 0804 09/04/20 0519  09/05/20 0503  WBC 6.4 6.1 10.3 8.7  NEUTROABS 4.7  --   --   --   HGB 14.2 14.3 12.4* 12.0*  HCT 45.6 44.5 40.0 36.8*  MCV 102.7* 102.1* 101.3* 99.5  PLT 206 212 203 993   Basic Metabolic Panel: Recent Labs  Lab 09/03/20 0010 09/03/20 0804 09/04/20 0519 09/05/20 0503  NA 135 136 138 139  K 4.1 4.9 4.3 3.9  CL 85* 84* 85* 90*  CO2 42* 44* 44* 37*  GLUCOSE 133* 162* 134* 83  BUN 10 13 23* 24*  CREATININE 0.62 0.59* 0.69 0.65  CALCIUM 9.2 9.3 9.2 9.0  MG  --   --  2.1 2.0   GFR: Estimated Creatinine Clearance: 73.8 mL/min (by C-G formula based on SCr of 0.65 mg/dL). Liver Function Tests: Recent Labs  Lab 09/03/20 0010 09/03/20 0804  AST 24 26  ALT 27 29  ALKPHOS 56 58  BILITOT 0.6 0.6  PROT 7.8 7.9  ALBUMIN 3.8 3.7   No results for input(s): LIPASE, AMYLASE in the last 168 hours. No results for input(s): AMMONIA in the last 168 hours. Coagulation Profile: No results for input(s): INR, PROTIME in the last 168 hours. Cardiac Enzymes: No results for input(s): CKTOTAL, CKMB, CKMBINDEX, TROPONINI in the last 168 hours. BNP (last 3 results) No results for input(s): PROBNP in the  last 8760 hours. HbA1C: No results for input(s): HGBA1C in the last 72 hours. CBG: Recent Labs  Lab 09/03/20 0801 09/06/20 0048  GLUCAP 155* 196*   Lipid Profile: No results for input(s): CHOL, HDL, LDLCALC, TRIG, CHOLHDL, LDLDIRECT in the last 72 hours. Thyroid Function Tests: No results for input(s): TSH, T4TOTAL, FREET4, T3FREE, THYROIDAB in the last 72 hours. Anemia Panel: No results for input(s): VITAMINB12, FOLATE, FERRITIN, TIBC, IRON, RETICCTPCT in the last 72 hours. Sepsis Labs: Recent Labs  Lab 09/03/20 0804 09/04/20 0519 09/05/20 0503  PROCALCITON <0.10 <0.10 <0.10    No results found for this or any previous visit (from the past 240 hour(s)).       Radiology Studies: No results found.      Scheduled Meds: . albuterol  2.5 mg Nebulization TID  .  arformoterol  15 mcg Nebulization BID  . aspirin EC  81 mg Oral Daily  . budesonide (PULMICORT) nebulizer solution  0.25 mg Nebulization BID  . cholecalciferol  2,000 Units Oral Daily  . docusate sodium  100 mg Oral Daily  . doxycycline  100 mg Oral Q12H  . enoxaparin (LOVENOX) injection  40 mg Subcutaneous Q24H  . feeding supplement  237 mL Oral TID BM  . furosemide  20 mg Oral Daily  . ipratropium  0.5 mg Nebulization TID  . multivitamin with minerals  1 tablet Oral Daily  . [START ON 09/07/2020] pantoprazole  40 mg Oral Daily  . potassium chloride SA  20 mEq Oral Daily  . [START ON 09/07/2020] predniSONE  40 mg Oral Q breakfast  . rosuvastatin  20 mg Oral Daily   Continuous Infusions: . sodium chloride Stopped (09/06/20 0116)     LOS: 3 days    Time spent: 38 minutes spent on chart review, discussion with nursing staff, consultants, updating family and interview/physical exam; more than 50% of that time was spent in counseling and/or coordination of care.    Ezekial Arns J [place] DO [facility] Available via Epic secure chat 7am-7pm After these hours, please refer to coverage provider listed on [email or web address] 09/06/2020, 10:55 AM

## 2020-09-07 ENCOUNTER — Other Ambulatory Visit (HOSPITAL_COMMUNITY): Payer: Self-pay | Admitting: Internal Medicine

## 2020-09-07 LAB — BASIC METABOLIC PANEL
Anion gap: 10 (ref 5–15)
BUN: 20 mg/dL (ref 6–20)
CO2: 37 mmol/L — ABNORMAL HIGH (ref 22–32)
Calcium: 9.1 mg/dL (ref 8.9–10.3)
Chloride: 92 mmol/L — ABNORMAL LOW (ref 98–111)
Creatinine, Ser: 0.59 mg/dL — ABNORMAL LOW (ref 0.61–1.24)
GFR, Estimated: >60 mL/min (ref >60)
Glucose, Bld: 111 mg/dL — ABNORMAL HIGH (ref 70–99)
Potassium: 3.4 mmol/L — ABNORMAL LOW (ref 3.5–5.1)
Sodium: 139 mmol/L (ref 135–145)

## 2020-09-07 MED ORDER — IPRATROPIUM-ALBUTEROL 0.5-2.5 (3) MG/3ML IN SOLN: 3.0000 mL | Freq: Two times a day (BID) | RESPIRATORY_TRACT | Status: DC

## 2020-09-07 MED ORDER — PREDNISONE 20 MG PO TABS: ORAL_TABLET | ORAL | 0 refills | Status: DC

## 2020-09-07 MED ORDER — ENSURE ENLIVE PO LIQD
237.0000 mL | Freq: Three times a day (TID) | ORAL | 0 refills | Status: AC
Start: 2020-09-07 — End: 2020-10-07

## 2020-09-07 MED FILL — predniSONE 20 MG TABS: 20 | 6 days supply | Qty: 5 | Fill #0

## 2020-09-07 NOTE — Progress Notes (Signed)
Physical Therapy Treatment Patient Details Name: Anthony Nelson MRN: [id number] DOB: [birth year] Today's Date: 09/07/2020    History of Present Illness Patient is a 60 y/o male who presents on 09/03/20 with SOB and cough. Found to have acute respiratory failure with hypoxia and hypercapnia due to COPD exacerbation. PMH includes pulmonary HTN, diastolic HF, bronchitis.    PT Comments    Pt received in supine, agreeable to limited therapy session, pt performed pre-gait task at bedside to simulate stair trial and given education on energy conservation with stair performance and gait tasks. Pt given HEP handout and encouraged to perform supine/seated/standing exercises TID as able, with close monitoring of SpO2 during mobility. RN/MD informed pt will likely need 4-6L O2 [name] during exertion as he desat to 73% on 2L O2 [name] with minimal mobility at bedside. Pt continues to benefit from PT services to progress toward functional mobility goals. Pt may benefit from cardiopulmonary rehab in outpatient environment if available.    Follow Up Recommendations  No PT follow up;Supervision - Intermittent     Equipment Recommendations  None recommended by PT    Recommendations for Other Services       Precautions / Restrictions Precautions Precautions: Fall;Other (comment) Precaution Comments: watch 02/HR Restrictions Weight Bearing Restrictions: No    Mobility  Bed Mobility Overal bed mobility: Modified Independent             General bed mobility comments: increased time, use of rails    Transfers Overall transfer level: Needs assistance Equipment used: None Transfers: Sit to/from Stand Sit to Stand: Supervision         General transfer comment: for safety and lines, EOB<>RW  Ambulation/Gait Ambulation/Gait assistance: Supervision Gait Distance (Feet):  (pre-gait marching activity) Assistive device: Rolling walker (2 wheeled)       General Gait Details: cues for pre-gait marching  activity to simulate stair trial, pt able to lift knees to waist height x10 reps without difficulty but SpO2 desat to 73% on 2L O2 [name], improved to >88% within 2 minutes of seated rest break once O2 increased to 3L O2 [name], cues for pursed-lip breathing needed as well. RN/MD notified   Stairs             Wheelchair Mobility    Modified Rankin (Stroke Patients Only)       Balance Overall balance assessment: No apparent balance deficits (not formally assessed)                                          Cognition Arousal/Alertness: Awake/alert Behavior During Therapy: WFL for tasks assessed/performed Overall Cognitive Status: Within Functional Limits for tasks assessed                                 General Comments: receptive to instruction but minimally verbal      Exercises General Exercises - Lower Extremity Ankle Circles/Pumps: AROM;Both;5 reps;Supine Heel Slides: AROM;Both;5 reps;Supine Straight Leg Raises: AROM;Both;5 reps;Supine Hip Flexion/Marching: AROM;Both;10 reps;Standing Other Exercises Other Exercises: also reviewed other supine/standing HEP per handout (HEP: [name] Access Code: [name])    General Comments General comments (skin integrity, edema, etc.): reviewed activity pacing, energy conservation with household and stair mobility, checking pulse oximeter with mobility to maintain >88% SpO2      Pertinent Vitals/Pain Pain Assessment: No/denies pain  Home Living                      Prior Function            PT Goals (current goals can now be found in the care plan section) Acute Rehab PT Goals Patient Stated Goal: to play with his grandchildren PT Goal Formulation: With patient Time For Goal Achievement: 09/19/20 Potential to Achieve Goals: Good Progress towards PT goals: Progressing toward goals (SpO2 limiting, slow progress)    Frequency    Min 3X/week      PT Plan Current  plan remains appropriate    Co-evaluation              AM-PAC PT "6 Clicks" Mobility   Outcome Measure  Help needed turning from your back to your side while in a flat bed without using bedrails?: None Help needed moving from lying on your back to sitting on the side of a flat bed without using bedrails?: None Help needed moving to and from a bed to a chair (including a wheelchair)?: A Little Help needed standing up from a chair using your arms (e.g., wheelchair or bedside chair)?: A Little Help needed to walk in hospital room?: A Little Help needed climbing 3-5 steps with a railing? : A Little 6 Click Score: 20    End of Session Equipment Utilized During Treatment: Oxygen Activity Tolerance: Treatment limited secondary to medical complications (Comment);Patient limited by fatigue (drop in SpO2, pt wanting to rest for discharge) Patient left: in bed;with call bell/phone within reach Nurse Communication: Mobility status PT Visit Diagnosis: Difficulty in walking, not elsewhere classified (R26.2)     Time: 1010-1031 PT Time Calculation (min) (ACUTE ONLY): 21 min  Charges:  $Therapeutic Exercise: 8-22 mins                     Toshiyuki Fredell P., PTA Acute Rehabilitation Services Pager: [phone] Office: [place] 09/07/2020, 11:43 AM

## 2020-09-07 NOTE — Progress Notes (Signed)
Occupational Therapy Treatment Patient Details Name: Anthony Nelson MRN: [id number] DOB: 01/05/1961 Today's Date: 09/07/2020    History of present illness Patient is a 60 y/o male who presents on 09/03/20 with SOB and cough. Found to have acute respiratory failure with hypoxia and hypercapnia due to COPD exacerbation. PMH includes pulmonary HTN, diastolic HF, bronchitis.   OT comments  Pt with Sp02 of 91% on 2L in bed, requires 4-6L with exertion, but only supervision for safety, HR in 120s. Educated in energy conservation strategies, pursed lip breathing.   Follow Up Recommendations  No OT follow up    Equipment Recommendations  None recommended by OT    Recommendations for Other Services      Precautions / Restrictions Precautions Precautions: Fall;Other (comment) Precaution Comments: watch 02/HR Restrictions Weight Bearing Restrictions: No       Mobility Bed Mobility Overal bed mobility: Modified Independent                  Transfers Overall transfer level: Needs assistance Equipment used: None Transfers: Sit to/from Stand Sit to Stand: Supervision         General transfer comment: for safety and lines    Balance Overall balance assessment: No apparent balance deficits (not formally assessed)                                         ADL either performed or assessed with clinical judgement   ADL Overall ADL's : Needs assistance/impaired     Grooming: Wash/dry hands;Standing;Supervision/safety               Lower Body Dressing: Set up;Sitting/lateral leans   Toilet Transfer: Supervision/safety   Toileting- Clothing Manipulation and Hygiene: Supervision/safety       Functional mobility during ADLs: Supervision/safety General ADL Comments: Pt educated in energy conservation strategies and provided written handout.     Vision       Perception     Praxis      Cognition Arousal/Alertness: Awake/alert Behavior During  Therapy: WFL for tasks assessed/performed Overall Cognitive Status: Within Functional Limits for tasks assessed                                          Exercises     Shoulder Instructions       General Comments      Pertinent Vitals/ Pain       Pain Assessment: No/denies pain  Home Living                                          Prior Functioning/Environment              Frequency  Min 2X/week        Progress Toward Goals  OT Goals(current goals can now be found in the care plan section)  Progress towards OT goals: Progressing toward goals  Acute Rehab OT Goals Patient Stated Goal: to play with his grandchildren OT Goal Formulation: With patient Time For Goal Achievement: 09/19/20 Potential to Achieve Goals: Good  Plan Discharge plan remains appropriate    Co-evaluation  AM-PAC OT "6 Clicks" Daily Activity     Outcome Measure   Help from another person eating meals?: None Help from another person taking care of personal grooming?: None Help from another person toileting, which includes using toliet, bedpan, or urinal?: None Help from another person bathing (including washing, rinsing, drying)?: None Help from another person to put on and taking off regular upper body clothing?: None Help from another person to put on and taking off regular lower body clothing?: None 6 Click Score: 24    End of Session Equipment Utilized During Treatment: Oxygen  OT Visit Diagnosis: Unsteadiness on feet (R26.81)   Activity Tolerance Patient limited by fatigue   Patient Left in bed;with call bell/phone within reach   Nurse Communication          Time: 3200-3794 OT Time Calculation (min): 19 min  Charges: OT General Charges $OT Visit: 1 Visit OT Treatments $Self Care/Home Management : 8-22 mins  Nestor Lewandowsky, OTR/L Acute Rehabilitation Services Pager: [phone] Office:  [phone]   Malka So 09/07/2020, 9:44 AM

## 2020-09-07 NOTE — Progress Notes (Signed)
D/C instructions given and reviewed. No questions asked but encouraged to call with any concerns. Tele and IV removed, tolerated well.

## 2020-09-07 NOTE — Discharge Summary (Signed)
Physician Discharge Summary  Jadakiss Barish TKZ:[id number] DOB: Jun 11, 1961 DOA: 09/02/2020  PCP: Elsie Stain, MD  Admit date: 09/02/2020 Discharge date: 09/07/2020  Admitted From: Home  Disposition:  Home  Recommendations for Outpatient Follow-up and new medication changes:  1. Follow up with Dr. [name] in 7 days.  2. Continue using Home Bipap  3. Supplemental 02 per [place] to keep oxygen saturation more than 88%   Home Health: no   Equipment/Devices: home 02    Discharge Condition: stable  CODE STATUS: full  Diet recommendation: heart healthy diet.   Brief/Interim Summary: Mr. Christina was admitted to the hospital with a working diagnosis of acute on chronic hypoxic respiratory failure due to COPD exacerbation, complicated by acute metabolic encephalopathy.  60 year old male past medical history for aortic atherosclerosis, COPD, chronic diastolic heart failure, pulmonary hypertension, chronic hypoxic-hypercapnic respiratory failure (home Bipap), and GERD who presented to the hospital with progressive dyspnea associated with increased sputum production, cough and oxygen requirements.  On his initial physical examination his temperature was 98.5, his heart rate was 90, respiratory rate 22, blood pressure 143/90, oxygen saturation 98% on 3 L/min per [place].  He had decreased breath sounds bilaterally, positive wheezing, tachypnea and increased work of breathing, heart S1-S2, present, tachycardic, abdomen soft, no lower extremity edema.  Sodium 135, potassium 4.1, chloride 85, bicarb 42, glucose 133, BUN 10, creatinine 0.62,  ABG pH 7.26, P CO2 110, PO2 99.8, bicarb 47.7, oxygen saturation 97.3.  White cell count 6.4, hemoglobin 14.2, hematocrit 45.6, platelets 206. SARS COVID-19 negative. Chest radiograph with significant hyperinflation.\ EKG 90 bpm, rightward axis, normal intervals, sinus rhythm, no significant ST segment or T wave changes.  Patient developed altered mentation the first day of  his hospitalization, he was unresponsive to verbal or pain stimuli.  Follow-up ABG showed worsening respiratory acidosis with a pH of 7.11, PCO2 greater than 110.  He was placed on noninvasive mechanical ventilation, aggressive bronchodilator therapy, systemic corticosteroids and doxycycline.  On March 6 his arterial blood gas showed pH 7.44, PCO2 65.6, PO2 148, bicarb 24.4, oxygen saturation at 99.2%.  His mentation and respiratory failure slowly improved.  At the time of his discharge he was back on supplemental oxygen per nasal cannula. His diuretic regimen had resolved with good toleration.  1. Acute on chronic hypoxic and hypercapnic respiratory failure due to COPD exacerbation/complicated with acute metabolic encephalopathy.  Patient was admitted to the progressive care unit, he received aggressive medical therapy with bronchodilators, systemic steroids and oral antibiotics. He was placed on noninvasive mechanical ventilation with improvement of his respiratory failure.  He will continue slow taper of prednisone, continue supplemental oxygen per nasal cannula, target oxygen saturation greater than 88%. He was encouraged to continue using his home BiPAP. Continue bronchodilator therapy,albuterol, LABA, LAMA and inhaled corticosteroids.   By the time of his discharge his mentation has returned to baseline.  2.  Chronic diastolic heart failure, pulmonary hypertension, cor pulmonale.  His echocardiogram from 03/2020 showed severe RV failure, severely enlarged RV, mild to moderate tricuspid regurgitation, the LV systolic function preserved ejection fraction 60 to 65%. Continue gentle diuresis with furosemide, 20 mg daily. No signs of acute decompensation during the hospitalization.  3.  Dyslipidemia/atherosclerosis.  Continue with rosuvastatin and aspirin.  4.  Chronic anemia.  Hemoglobin/hematocrit remained stable.  5.  GERD.  Continue antiacid therapy.  Discharge Diagnoses:  Active  Problems:   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)   Chronic diastolic heart failure ([place])  Atherosclerosis of aorta (HCC)   COPD GOLD IV with acute exacerbation (HCC)   Macrocytosis   Acute on chronic respiratory failure with hypoxia and hypercapnia (HCC)   Acute hypercapnic respiratory failure (HCC)   Acute metabolic encephalopathy    Discharge Instructions   Allergies as of 09/07/2020      Reactions   Erythromycin Other (See Comments)   Allergy from childhood- does not take it      Medication List    TAKE these medications   albuterol (2.5 MG/3ML) 0.083% nebulizer solution Commonly known as: PROVENTIL Take 3 mLs (2.5 mg total) by nebulization every 6 (six) hours as needed for wheezing or shortness of breath.   albuterol 108 (90 Base) MCG/ACT inhaler Commonly known as: VENTOLIN HFA Inhale 2 puffs into the lungs every 6 (six) hours as needed for wheezing or shortness of breath.   aspirin EC 81 MG tablet Take 81 mg by mouth daily. Swallow whole.   B-12 PO Take 1 tablet by mouth daily.   budesonide-formoterol 160-4.5 MCG/ACT inhaler Commonly known as: SYMBICORT Inhale 2 puffs into the lungs 2 (two) times daily.   dextromethorphan-guaiFENesin 30-600 MG 12hr tablet Commonly known as: MUCINEX DM Take 1 tablet by mouth 2 (two) times daily as needed for cough.   docusate sodium 100 MG capsule Commonly known as: COLACE Take 1 capsule (100 mg total) by mouth daily.   famotidine 20 MG tablet Commonly known as: PEPCID TAKE 1 TABLET (20 MG TOTAL) BY MOUTH 2 (TWO) TIMES DAILY.   feeding supplement Liqd Take 237 mLs by mouth 3 (three) times daily between meals.   furosemide 20 MG tablet Commonly known as: Lasix Take 1 tablet (20 mg total) by mouth daily.   Incruse Ellipta 62.5 MCG/INH Aepb Generic drug: umeclidinium bromide Inhale 1 puff into the lungs daily.   OXYGEN Inhale 2 L/min into the lungs continuous.   potassium  chloride SA 20 MEQ tablet Commonly known as: KLOR-CON TAKE 1 TABLET (20 MEQ TOTAL) BY MOUTH DAILY.   predniSONE 20 MG tablet Commonly known as: DELTASONE Take one tablet daily for three days, then take half tablet daily for three days.   rosuvastatin 20 MG tablet Commonly known as: CRESTOR Take 1 tablet (20 mg total) by mouth daily.   vitamin C 100 MG tablet Take 100 mg by mouth daily.   Vitamin D3 50 MCG (2000 UT) Tabs Take 2,000 Units by mouth daily.   ZINC PO Take 1 tablet by mouth daily.       Allergies  Allergen Reactions  . Erythromycin Other (See Comments)    Allergy from childhood- does not take it        Procedures/Studies: DG Chest Portable 1 View  Result Date: 09/03/2020 CLINICAL DATA:  Dyspnea EXAM: PORTABLE CHEST 1 VIEW COMPARISON:  03/09/2020, CT 11/26/2019 FINDINGS: The lungs are symmetrically hyperinflated in keeping with changes of underlying COPD. No pneumothorax or pleural effusion. Cardiac size within normal limits. Central pulmonary arteries appear mildly enlarged suggesting changes of pulmonary arterial hypertension. The pulmonary vascularity is otherwise unremarkable. IMPRESSION: Pulmonary hyperinflation in keeping with changes of underlying COPD. Central pulmonary arterial enlargement indicative of pulmonary artery hypertension. Electronically Signed   By: Fidela Salisbury MD   On: 09/03/2020 01:14        Subjective: Patient is feeling better, dyspnea is back to baseline, no confusion. No nausea or vomiting.   Discharge Exam: Vitals:   09/07/20 0734 09/07/20 0736  BP:    Pulse:  Resp:    Temp:    SpO2: 96% 99%   Vitals:   09/07/20 0506 09/07/20 0732 09/07/20 0734 09/07/20 0736  BP: 101/77     Pulse: 73     Resp: 18     Temp: 97.9 F (36.6 C)     TempSrc: Oral     SpO2: 100% 94% 96% 99%  Weight: 53.3 kg     Height:        General: Not in pain or dyspnea.  Neurology: Awake and alert, non focal  E ENT: mild pallor, no icterus,  oral mucosa moist Cardiovascular: No JVD. S1-S2 present, rhythmic, no gallops, rubs, or murmurs. No lower extremity edema. Pulmonary: decreased breath sounds bilaterally,with  wheezing, scattered rhonchi but no rales. Gastrointestinal. Abdomen soft and non tender Skin. No rashes Musculoskeletal: no joint deformities   The results of significant diagnostics from this hospitalization (including imaging, microbiology, ancillary and laboratory) are listed below for reference.     Microbiology: No results found for this or any previous visit (from the past 240 hour(s)).   Labs: BNP (last 3 results) Recent Labs    03/08/20 1141  BNP 6,767.2*   Basic Metabolic Panel: Recent Labs  Lab 09/03/20 0010 09/03/20 0804 09/04/20 0519 09/05/20 0503 09/07/20 0402  NA 135 136 138 139 139  K 4.1 4.9 4.3 3.9 3.4*  CL 85* 84* 85* 90* 92*  CO2 42* 44* 44* 37* 37*  GLUCOSE 133* 162* 134* 83 111*  BUN 10 13 23* 24* 20  CREATININE 0.62 0.59* 0.69 0.65 0.59*  CALCIUM 9.2 9.3 9.2 9.0 9.1  MG  --   --  2.1 2.0  --    Liver Function Tests: Recent Labs  Lab 09/03/20 0010 09/03/20 0804  AST 24 26  ALT 27 29  ALKPHOS 56 58  BILITOT 0.6 0.6  PROT 7.8 7.9  ALBUMIN 3.8 3.7   No results for input(s): LIPASE, AMYLASE in the last 168 hours. No results for input(s): AMMONIA in the last 168 hours. CBC: Recent Labs  Lab 09/03/20 0010 09/03/20 0804 09/04/20 0519 09/05/20 0503  WBC 6.4 6.1 10.3 8.7  NEUTROABS 4.7  --   --   --   HGB 14.2 14.3 12.4* 12.0*  HCT 45.6 44.5 40.0 36.8*  MCV 102.7* 102.1* 101.3* 99.5  PLT 206 212 203 205   Cardiac Enzymes: No results for input(s): CKTOTAL, CKMB, CKMBINDEX, TROPONINI in the last 168 hours. BNP: Invalid input(s): POCBNP CBG: Recent Labs  Lab 09/03/20 0801 09/06/20 0048  GLUCAP 155* 196*   D-Dimer No results for input(s): DDIMER in the last 72 hours. Hgb A1c No results for input(s): HGBA1C in the last 72 hours. Lipid Profile No results  for input(s): CHOL, HDL, LDLCALC, TRIG, CHOLHDL, LDLDIRECT in the last 72 hours. Thyroid function studies No results for input(s): TSH, T4TOTAL, T3FREE, THYROIDAB in the last 72 hours.  Invalid input(s): FREET3 Anemia work up No results for input(s): VITAMINB12, FOLATE, FERRITIN, TIBC, IRON, RETICCTPCT in the last 72 hours. Urinalysis    Component Value Date/Time   COLORURINE STRAW (A) 03/08/2020 1624   APPEARANCEUR CLEAR 03/08/2020 1624   LABSPEC 1.006 03/08/2020 1624   PHURINE 5.0 03/08/2020 1624   GLUCOSEU NEGATIVE 03/08/2020 1624   HGBUR NEGATIVE 03/08/2020 1624   BILIRUBINUR NEGATIVE 03/08/2020 1624   KETONESUR NEGATIVE 03/08/2020 1624   PROTEINUR NEGATIVE 03/08/2020 1624   NITRITE NEGATIVE 03/08/2020 1624   LEUKOCYTESUR NEGATIVE 03/08/2020 1624   Sepsis Labs Invalid input(s): PROCALCITONIN,  WBC,  LACTICIDVEN Microbiology No results found for this or any previous visit (from the past 240 hour(s)).   Time coordinating discharge: 45 minutes  SIGNED:   Tawni Millers, MD  [facility] 09/07/2020, 8:15 AM

## 2020-09-07 NOTE — Plan of Care (Signed)
  Problem: Education: Goal: Knowledge of General Education information will improve Description: Including pain rating scale, medication(s)/side effects and non-pharmacologic comfort measures Outcome: Adequate for Discharge

## 2020-09-07 NOTE — TOC Transition Note (Signed)
Transition of Care [facility]) - CM/SW Discharge Note   Patient Details  Name: Anthony Nelson MRN: [id number] Date of Birth: [birth year]  [facility] CM/SW Contact:  Zenon Mayo, RN Phone Number: 09/07/2020, 9:44 AM   Clinical Narrative:    Patient is for dc today, he states he has home oxygen with [employer],  NCM contacted Zach with [employer] to bring a tank to patient room for him to go home with.  Patient also states he would like to be evaluated for a smaller portable tank, NCM will let [employer] know this information.   Final next level of care: Home/Self Care Barriers to Discharge: No Barriers Identified   Patient Goals and CMS Choice Patient states their goals for this hospitalization and ongoing recovery are:: go home   Choice offered to / list presented to : NA  Discharge Placement                       Discharge Plan and Services                  DME Agency: NA       HH Arranged: NA          Social Determinants of Health (SDOH) Interventions     Readmission Risk Interventions No flowsheet data found.

## 2020-09-08 ENCOUNTER — Telehealth: Payer: Self-pay

## 2020-09-08 NOTE — Telephone Encounter (Signed)
Transition Care Management Follow-up Telephone Call  Date of discharge and from where: 09/07/2020 from [facility]   How have you been since you were released from the hospital? Pt is doing well.   Any questions or concerns? No  Items Reviewed:  Did the pt receive and understand the discharge instructions provided? Yes   Medications obtained and verified? Yes   Other? No   Any new allergies since your discharge? No   Dietary orders reviewed? Heart healthy  Do you have support at home? Yes   Functional Questionnaire: (I = Independent and D = Dependent) ADLs: I  Bathing/Dressing- I  Meal Prep- I  Eating- I  Maintaining continence- I  Transferring/Ambulation- I  Managing Meds- I   Follow up appointments reviewed:   PCP Hospital f/u appt confirmed? Yes  Scheduled to see Asencion Noble, MD on 10/04/2020 @ 03:30pm.  [facility] f/u appt confirmed? No    Are transportation arrangements needed? No   If their condition worsens, is the pt aware to call PCP or go to the Emergency Dept.? Yes  Was the patient provided with contact information for the PCP's office or ED? Yes  Was to pt encouraged to call back with questions or concerns? Yes

## 2020-09-08 NOTE — Telephone Encounter (Signed)
Transition Care Management Follow-up Telephone Call  Call completed with patient's wife, [name].  He was using the nebulizer at the time of this call.   Date of discharge and from where: 09/07/2020, [facility]   How have you been since you were released from the hospital? She said he is doing pretty good.  Any questions or concerns? No  Items Reviewed:  Did the pt receive and understand the discharge instructions provided? Yes   Medications obtained and verified? Yes , she said he has all medications, including the prednisone and they did not have any questions about his med regime   Other? No    Any new allergies since your discharge? No   Do you have support at home? Yes , his wife  [place] and Equipment/Supplies: Were home health services ordered? no If so, what is the name of the agency? n/a  Has the agency set up a time to come to the patient's home? not applicable Were any new equipment or medical supplies ordered?  No What is the name of the medical supply agency? n/a Were you able to get the supplies/equipment? not applicable Do you have any questions related to the use of the equipment or supplies? No   Has home O2 from [employer] and he has been using it @ 2L continuously. Has pulse oximeter - O2 sat 97-99% with )2. Has BiPAP and nebulizer.     Functional Questionnaire: (I = Independent and D = Dependent) ADLs: independent.  Has rollator but has not had to use it.   Follow up appointments reviewed:   PCP Hospital f/u appt confirmed? Yes  - Dr [name], 10/04/2020. When offered an appointment to be seen before then he was not sure he wanted to re-schedule. Instructed his wife to have him call the clinic if he would like to be seen sooner  [facility] f/u appt confirmed? No , none at this time  Are transportation arrangements needed? not addressed  If their condition worsens, is the pt aware to call PCP or go to the Emergency Dept.? Yes  Was  the patient provided with contact information for the PCP's office or ED? Yes  Was to pt encouraged to call back with questions or concerns? Yes

## 2020-10-01 ENCOUNTER — Other Ambulatory Visit: Payer: Self-pay

## 2020-10-04 ENCOUNTER — Other Ambulatory Visit: Payer: Self-pay

## 2020-10-04 ENCOUNTER — Encounter: Payer: Self-pay | Admitting: Critical Care Medicine

## 2020-10-04 ENCOUNTER — Ambulatory Visit: Payer: Medicaid Other | Attending: Critical Care Medicine | Admitting: Critical Care Medicine

## 2020-10-04 VITALS — BP 119/71 | HR 97 | Resp 18 | Ht 72.0 in | Wt 132.4 lb

## 2020-10-04 DIAGNOSIS — Z87891 Personal history of nicotine dependence: Secondary | ICD-10-CM | POA: Diagnosis not present

## 2020-10-04 DIAGNOSIS — J441 Chronic obstructive pulmonary disease with (acute) exacerbation: Secondary | ICD-10-CM

## 2020-10-04 DIAGNOSIS — J9612 Chronic respiratory failure with hypercapnia: Secondary | ICD-10-CM | POA: Diagnosis not present

## 2020-10-04 DIAGNOSIS — E785 Hyperlipidemia, unspecified: Secondary | ICD-10-CM | POA: Insufficient documentation

## 2020-10-04 DIAGNOSIS — E782 Mixed hyperlipidemia: Secondary | ICD-10-CM

## 2020-10-04 DIAGNOSIS — J449 Chronic obstructive pulmonary disease, unspecified: Secondary | ICD-10-CM | POA: Diagnosis not present

## 2020-10-04 DIAGNOSIS — I5032 Chronic diastolic (congestive) heart failure: Secondary | ICD-10-CM

## 2020-10-04 DIAGNOSIS — J9611 Chronic respiratory failure with hypoxia: Secondary | ICD-10-CM | POA: Diagnosis not present

## 2020-10-04 MED ORDER — ROSUVASTATIN CALCIUM 20 MG PO TABS
ORAL_TABLET | Freq: Every day | ORAL | 4 refills | Status: DC
  Filled 2020-11-22: qty 30, 30d supply, fill #0
  Filled 2021-03-09: qty 30, 30d supply, fill #1

## 2020-10-04 MED ORDER — BEVESPI AEROSPHERE 9-4.8 MCG/ACT IN AERO
12.0000 | INHALATION_SPRAY | Freq: Two times a day (BID) | RESPIRATORY_TRACT | 11 refills | Status: DC
  Filled 2020-10-04: qty 10.7, 30d supply, fill #0
  Filled 2020-11-22: qty 10.7, 30d supply, fill #1
  Filled 2021-01-18: qty 10.7, 30d supply, fill #2
  Filled 2021-03-09: qty 10.7, 30d supply, fill #3

## 2020-10-04 MED FILL — Albuterol Sulfate Inhal Aero 108 MCG/ACT (90MCG Base Equiv): RESPIRATORY_TRACT | 25 days supply | Qty: 8.5 | Fill #0 | Status: AC

## 2020-10-04 MED FILL — Rosuvastatin Calcium Tab 20 MG: ORAL | 30 days supply | Qty: 30 | Fill #0 | Status: AC

## 2020-10-04 MED FILL — Famotidine Tab 20 MG: ORAL | 30 days supply | Qty: 60 | Fill #0 | Status: AC

## 2020-10-04 NOTE — Assessment & Plan Note (Signed)
Hyperlipidemia with atherosclerosis of the aorta plan to continue the Crestor 20 mg daily

## 2020-10-04 NOTE — Assessment & Plan Note (Signed)
This patient is a former smoker

## 2020-10-04 NOTE — Assessment & Plan Note (Signed)
Gold stage D COPD FEV1 was 22% predicted in 2019.  Patient is inquiring if he might be a candidate for Bevespi inhaler.  He currently is on Incruse and Symbicort now has Medicaid  Plan is to discontinue Symbicort and Incruse and begin Bevespi 2 puffs twice daily  Will refer back to pulmonary medicine Dr. Melvyn Novas to see if he would be a candidate for the INOGEN POC and to see whether he would be a candidate for BiPAP as he has had chronic hypercarbic respiratory failure  We will continue oxygen as is and continue using the CPAP that he currently has at night

## 2020-10-04 NOTE — Progress Notes (Signed)
Subjective:    Patient ID: Anthony Nelson, male    DOB: 1961/05/10, 60 y.o.   MRN: [id number]   06/09/2019 This is a 60 year old male with gold stage D COPD on home oxygen.  The patient's been followed over time by the pulmonary clinic with Dr. Melvyn Novas.  His last visit was in 2019.  The patient is still actively smoking at this time.  The patient has been on a variety of inhalers but currently takes Incruse daily and Symbicort 160  twoinhalations twice daily.  Patient states overall his level of dyspnea is stable he states he has minimal cough.  He is not had recent exacerbations.  He does state when he ran out of his inhalers and was under a lot of stress he started smoking again his breathing worsened.  He is trying to go back to the nicotine patches and focusing on smoking cessation.  He does not have excess secretions at this time.  Pulmonary functions done in 2019 revealed FEV1 of less than 30% predicted consistent with gold stage D COPD  Note the patient also has pulmonary hypertension and cor pulmonale on the basis of the patient's COPD  The patient is on oxygen 2 L continuous at home   06/24/2019 This is the first in office visit as the last visit was a telephone visit.  This patient has gold stage IV COPD still actively smoking.  He has had issues with access to some of the inhalers however we were able to achieve at the last telephone visit patient assistance for Symbicort 160 and Incruse and is on both these medicines at this time.  He also maintains oxygen 2 L continuous.  Note he had been hospitalized in October.  During the October admission he had significant fluid retention and evidence of pulmonary hypertension with left ventricular hypertrophy  Today he notes he has increased weight and increased swelling in the feet and has not been using his furosemide on a regular basis.  He is under a great deal stress because in April of this year his son was apparently murdered and he and his  wife both of been in significant depression over this  He comes in today with oxygen saturation of 82% walking from the lobby into the exam room on 2 L  He is still smoking 3 to 4 cigarettes daily Review shortness of breath assessment below  08/11/2019  This patient returns today in follow-up for advanced COPD has continued to lose weight is down to 115 pounds with a BMI of 15.6  He has early satiety and has increased gas retention and aerophagia.  He maintains Symbicort at 2 inhalations twice daily and Incruse 1 puff a day  The patient also is using furosemide for chronic diastolic heart failure and does maintain an increased level of oxygen from 2 L rest to 4 L exertion  With the use of nicotine replacement therapy the patient is down to 4 cigarettes daily he does not drink alcohol  The mucus he is producing is clear with a less productive cough noted  The patient still has some element of wheezing  11/12/2019 Patient is seen in return follow-up for chronic obstructive lung disease and unexplained weight loss. Patient states his level of dyspnea is at baseline.  He is maintaining Symbicort twice daily and Incruse daily.  He does maintain furosemide for diastolic heart failure and oxygen 2 L rest for exertion.  He does have stage IV COPD based on previous spirometry.  Patient still smoking 3 cigarettes daily.  He is applying for disability remains anxious over this.  Wt Readings from Last 3 Encounters: 11/12/19 : 116 lb (52.6 kg) 08/11/19 : 115 lb (52.2 kg) 06/24/19 : 121 lb (54.9 kg)  Note at the last visit he had an elevated CEA level and we ordered a CT chest and abdomen that this is yet to be completed  11/9 This patient is seen return follow-up for Gold's stage D COPD with continued smoking however now the patient is no longer smoking at all and of congratulated him on this.  He still has dyspnea on exertion still has minimal mucus production maintains oxygen 2 L also has CPAP at  night see shortness of breath assessment below  The patient's weight loss work-up was unrevealing and he is actually gained 13 pounds since the last visit.  He is using Ensure supplements. Wt Readings from Last 3 Encounters: 05/10/20 : 129 lb 3.2 oz (58.6 kg) 04/06/20 : 122 lb 6.4 oz (55.5 kg) 03/25/20 : 113 lb 9.6 oz (51.5 kg)  10/04/2020 This patient is seen in return follow-up have been admitted in March for COPD exacerbation and acute hypercarbic hypoxic respiratory failure.  Note when he was admitted previously in September 2021 he was supposedly discharged on her home BiPAP unit but instead he was given a CPAP unit from the homecare company.  He does maintain oxygen now 24/7 2 L from the last hospitalization.  He is no longer smoking cigarettes at this time.  He still is dyspneic with exertion but is improved compared to his March hospitalization.  He now has full Medicaid.  He has had a previous pulmonary physician at the [facility] our practice but has not seen him in over 2 years now that he has Medicaid he wishes to return for evaluations.  He is requesting an evaluation to see if he should be on BiPAP and also whether he be a candidate for the inogen portable oxygen concentrator.  Below is a copy of his discharge summary Admit date: 09/02/2020 Discharge date: 09/07/2020  Admitted From: Home  Disposition:  Home  Recommendations for Outpatient Follow-up and new medication changes:  1. Follow up with Dr. [name] in 7 days.  2. Continue using Home Bipap  3. Supplemental 02 per [place] to keep oxygen saturation more than 88%   Home Health: no   Equipment/Devices: home 02    Discharge Condition: stable  CODE STATUS: full  Diet recommendation: heart healthy diet.   Brief/Interim Summary: [name] was admitted to the hospital with a working diagnosis of acute on chronic hypoxic respiratory failure due to COPD exacerbation, complicated by acute metabolic encephalopathy.  60 year old male  past medical history for aortic atherosclerosis, COPD, chronic diastolic heart failure, pulmonary hypertension, chronic hypoxic-hypercapnic respiratory failure (home Bipap), and GERD who presented to the hospital with progressive dyspnea associated with increased sputum production, cough and oxygen requirements.  On his initial physical examination his temperature was 98.5, his heart rate was 90, respiratory rate 22, blood pressure 143/90, oxygen saturation 98% on 3 L/min per [place].  He had decreased breath sounds bilaterally, positive wheezing, tachypnea and increased work of breathing, heart S1-S2, present, tachycardic, abdomen soft, no lower extremity edema.  Sodium 135, potassium 4.1, chloride 85, bicarb 42, glucose 133, BUN 10, creatinine 0.62,  ABG pH 7.26, P CO2 110, PO2 99.8, bicarb 47.7, oxygen saturation 97.3.  White cell count 6.4, hemoglobin 14.2, hematocrit 45.6, platelets 206. SARS COVID-19 negative. Chest radiograph  with significant hyperinflation.\ EKG 90 bpm, rightward axis, normal intervals, sinus rhythm, no significant ST segment or T wave changes.  Patient developed altered mentation the first day of his hospitalization, he was unresponsive to verbal or pain stimuli.  Follow-up ABG showed worsening respiratory acidosis with a pH of 7.11, PCO2 greater than 110.  He was placed on noninvasive mechanical ventilation, aggressive bronchodilator therapy, systemic corticosteroids and doxycycline.  On March 6 his arterial blood gas showed pH 7.44, PCO2 65.6, PO2 148, bicarb 24.4, oxygen saturation at 99.2%.  His mentation and respiratory failure slowly improved.  At the time of his discharge he was back on supplemental oxygen per nasal cannula. His diuretic regimen had resolved with good toleration.  1. Acute on chronic hypoxic and hypercapnic respiratory failure due to COPD exacerbation/complicated with acute metabolic encephalopathy.  Patient was admitted to the progressive care unit,  he received aggressive medical therapy with bronchodilators, systemic steroids and oral antibiotics. He was placed on noninvasive mechanical ventilation with improvement of his respiratory failure.  He will continue slow taper of prednisone, continue supplemental oxygen per nasal cannula, target oxygen saturation greater than 88%. He was encouraged to continue using his home BiPAP. Continue bronchodilator therapy,albuterol, LABA, LAMA and inhaled corticosteroids.   By the time of his discharge his mentation has returned to baseline.  2.  Chronic diastolic heart failure, pulmonary hypertension, cor pulmonale.  His echocardiogram from 03/2020 showed severe RV failure, severely enlarged RV, mild to moderate tricuspid regurgitation, the LV systolic function preserved ejection fraction 60 to 65%. Continue gentle diuresis with furosemide, 20 mg daily. No signs of acute decompensation during the hospitalization.  3.  Dyslipidemia/atherosclerosis.  Continue with rosuvastatin and aspirin.  4.  Chronic anemia.  Hemoglobin/hematocrit remained stable.  5.  GERD.  Continue antiacid therapy.  Discharge Diagnoses:  Active Problems:   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)   Chronic diastolic heart failure (HCC)   Atherosclerosis of aorta (HCC)   COPD GOLD IV with acute exacerbation (HCC)   Macrocytosis   Acute on chronic respiratory failure with hypoxia and hypercapnia (HCC)   Acute hypercapnic respiratory failure (HCC)   Acute metabolic encephalopathy     Shortness of Breath This is a chronic problem. The current episode started more than 1 year ago. The problem occurs daily. The problem has been unchanged. Associated symptoms include sputum production. Pertinent negatives include no chest pain, claudication, fever, headaches, hemoptysis, leg swelling, PND, rhinorrhea, sore throat or wheezing. The symptoms are aggravated by any activity and weather  changes. Associated symptoms comments: Mucus is clr. Risk factors include smoking. He has tried beta agonist inhalers, ipratropium inhalers and steroid inhalers for the symptoms. The treatment provided significant relief. His past medical history is significant for COPD.    Past Medical History:  Diagnosis Date  . Atherosclerosis of aorta ([place]) 04/06/2020  . Bronchitis   . COPD (chronic obstructive pulmonary disease) ([place])   . Diastolic heart failure ([place])   . GERD (gastroesophageal reflux disease)   . Pulmonary hypertension ([place])   . 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 ([place]) 04/10/2018  . Respiratory failure with hypoxia (HCC)      Family History  Problem Relation Age of Onset  . Cancer Mother   . Cancer Father   . CAD Father   . Heart failure Father   . Diabetes Maternal Grandmother   . Kidney disease Maternal Grandmother   . Kidney cancer Paternal Grandmother   . Heart  attack Paternal Grandfather      Social History   Socioeconomic History  . Marital status: Married    Spouse name: Yariel Ferraris  . Number of children: 3  . Years of education: 16  . Highest education level: Bachelor's degree (e.g., BA, AB, BS)  Occupational History  . Occupation: Presenter, broadcasting  Tobacco Use  . Smoking status: Former Smoker    Packs/day: 0.10    Years: 44.00    Pack years: 4.40    Types: Cigarettes    Quit date: 07/20/2020    Years since quitting: 0.2  . Smokeless tobacco: Never Used  Vaping Use  . Vaping Use: Never used  Substance and Sexual Activity  . Alcohol use: Not Currently    Comment: Socially   . Drug use: No  . Sexual activity: Not Currently  Other Topics Concern  . Not on file  Social History Narrative  . Not on file   Social Determinants of Health   Financial Resource Strain: Not on file  Food Insecurity: Not on file  Transportation Needs: Not on file  Physical Activity: Not on file  Stress: Not on file  Social Connections:  Not on file  Intimate Partner Violence: Not on file     Allergies  Allergen Reactions  . Erythromycin Other (See Comments)    Allergy from childhood- does not take it     Outpatient Medications Prior to Visit  Medication Sig Dispense Refill  . albuterol (PROVENTIL) (2.5 MG/3ML) 0.083% nebulizer solution TAKE 3 MLS (2.5 MG TOTAL) BY NEBULIZATION EVERY 6 (SIX) HOURS AS NEEDED FOR WHEEZING OR SHORTNESS OF BREATH. 75 mL 2  . albuterol (VENTOLIN HFA) 108 (90 Base) MCG/ACT inhaler INHALE 2 PUFFS INTO THE LUNGS EVERY 6 HOURS AS NEEDED FOR WHEEZING OR SHORTNESS OF BREATH 8.5 g 2  . Ascorbic Acid (VITAMIN C) 100 MG tablet Take 100 mg by mouth daily.    Marland Kitchen aspirin EC 81 MG tablet Take 81 mg by mouth daily. Swallow whole.    . Cholecalciferol (VITAMIN D3) 50 MCG (2000 UT) TABS Take 2,000 Units by mouth daily.    . Cyanocobalamin (B-12 PO) Take 1 tablet by mouth daily.    Marland Kitchen dextromethorphan-guaiFENesin (MUCINEX DM) 30-600 MG 12hr tablet Take 1 tablet by mouth 2 (two) times daily as needed for cough.    . docusate sodium (COLACE) 100 MG capsule Take 1 capsule (100 mg total) by mouth daily. 10 capsule 0  . famotidine (PEPCID) 20 MG tablet TAKE 1 TABLET (20 MG TOTAL) BY MOUTH 2 (TWO) TIMES DAILY. (Patient taking differently: Take 20 mg by mouth 2 (two) times daily.) 60 tablet 3  . feeding supplement (ENSURE ENLIVE / ENSURE PLUS) LIQD Take 237 mLs by mouth 3 (three) times daily between meals. 21330 mL 0  . furosemide (LASIX) 20 MG tablet TAKE 1 TABLET (20 MG TOTAL) BY MOUTH DAILY. 90 tablet 1  . Multiple Vitamins-Minerals (ZINC PO) Take 1 tablet by mouth daily.    . OXYGEN Inhale 2 L/min into the lungs continuous.     . potassium chloride SA (KLOR-CON) 20 MEQ tablet TAKE 1 TABLET (20 MEQ TOTAL) BY MOUTH DAILY. 30 tablet 3  . budesonide-formoterol (SYMBICORT) 160-4.5 MCG/ACT inhaler Inhale 2 puffs into the lungs 2 (two) times daily. 10.2 g 6  . predniSONE (DELTASONE) 20 MG tablet TAKE ONE TABLET DAILY FOR  THREE DAYS, THEN TAKE HALF TABLET DAILY FOR THREE DAYS. 5 tablet 0  . rosuvastatin (CRESTOR) 20 MG tablet TAKE  1 TABLET (20 MG TOTAL) BY MOUTH DAILY. 30 tablet 0  . rosuvastatin (CRESTOR) 20 MG tablet TAKE 1 TABLET (20 MG TOTAL) BY MOUTH DAILY. 30 tablet 4  . umeclidinium bromide (INCRUSE ELLIPTA) 62.5 MCG/INH AEPB Inhale 1 puff into the lungs daily. 1 each 11   No facility-administered medications prior to visit.     Review of Systems  Constitutional: Negative for fever and unexpected weight change.  HENT: Negative for rhinorrhea and sore throat.   Respiratory: Positive for sputum production and shortness of breath. Negative for cough, hemoptysis, chest tightness and wheezing.   Cardiovascular: Negative for chest pain, claudication, leg swelling and PND.  Gastrointestinal: Negative.   Genitourinary: Positive for frequency.  Musculoskeletal: Negative.   Neurological: Negative for headaches.  Psychiatric/Behavioral: Negative.        Objective:   Physical Exam    Vitals:   10/04/20 1559  BP: 119/71  Pulse: 97  Resp: 18  SpO2: 97%  Weight: 132 lb 6.4 oz (60.1 kg)  Height: 6' (1.829 m)    Gen: Pleasant, thin, in no distress,  normal affect  ENT: No lesions,  mouth clear,  oropharynx clear, no postnasal drip  Neck: No JVD, no TMG, no carotid bruits  Lungs: No use of accessory muscles, no dullness to percussion, distant breath sounds with hyperresonance to percussion, no wheezes noted  Cardiovascular: RRR, heart sounds normal, no murmur or gallops,no peripheral edema  Abdomen: soft and NT, no HSM,  BS normal  Musculoskeletal: No deformities, no cyanosis or clubbing  Neuro: alert, non focal  Skin: Warm, no lesions or rashes   Assessment & Plan:  I personally reviewed all images and lab data in the [facility] system as well as any outside material available during this office visit and agree with the  radiology impressions.   COPD GOLD IV  Gold stage D COPD FEV1 was 22%  predicted in 2019.  Patient is inquiring if he might be a candidate for Bevespi inhaler.  He currently is on Incruse and Symbicort now has Medicaid  Plan is to discontinue Symbicort and Incruse and begin Bevespi 2 puffs twice daily  Will refer back to pulmonary medicine Dr. Melvyn Novas to see if he would be a candidate for the INOGEN POC and to see whether he would be a candidate for BiPAP as he has had chronic hypercarbic respiratory failure  We will continue oxygen as is and continue using the CPAP that he currently has at night  Former smoker This patient is a former smoker  Chronic diastolic heart failure ([place]) Chronic diastolic heart failure stable at this time  Hyperlipidemia Hyperlipidemia with atherosclerosis of the aorta plan to continue the Crestor 20 mg daily   Dia was seen today for hospitalization follow-up.  Diagnoses and all orders for this visit:  Chronic diastolic heart failure (HCC) -     Comprehensive metabolic panel -     CBC with Differential/Platelet  COPD GOLD IV with acute exacerbation (HCC) -     Ambulatory referral to Pulmonology  Chronic respiratory failure with hypoxia (HCC)  Chronic respiratory failure with hypoxia and hypercapnia (HCC) -     Ambulatory referral to Pulmonology  COPD GOLD IV   Former smoker  Mixed hyperlipidemia  Other orders -     Glycopyrrolate-Formoterol (BEVESPI AEROSPHERE) 9-4.8 MCG/ACT AERO; Inhale 1 puff into the lungs in the morning and at bedtime. -     rosuvastatin (CRESTOR) 20 MG tablet; TAKE 1 TABLET (20 MG TOTAL) BY MOUTH  DAILY.

## 2020-10-04 NOTE — Patient Instructions (Signed)
Referral back to pulmonary Dr. Melvyn Novas is made to assess you for Energen and also whether you should be on BiPAP  Discontinue Symbicort and Incruse and begin Bevespri  2 puffs twice daily  Labs today will be obtained  Return 2 months

## 2020-10-04 NOTE — Assessment & Plan Note (Signed)
Chronic diastolic heart failure stable at this time

## 2020-10-05 ENCOUNTER — Telehealth: Payer: Self-pay

## 2020-10-05 LAB — COMPREHENSIVE METABOLIC PANEL
ALT: 25 IU/L (ref 0–44)
AST: 19 IU/L (ref 0–40)
Albumin/Globulin Ratio: 1.7 (ref 1.2–2.2)
Albumin: 4.3 g/dL (ref 3.8–4.9)
Alkaline Phosphatase: 70 IU/L (ref 44–121)
BUN/Creatinine Ratio: 14 (ref 10–24)
BUN: 10 mg/dL (ref 8–27)
Bilirubin Total: 0.3 mg/dL (ref 0.0–1.2)
CO2: 31 mmol/L — ABNORMAL HIGH (ref 20–29)
Calcium: 9 mg/dL (ref 8.6–10.2)
Chloride: 98 mmol/L (ref 96–106)
Creatinine, Ser: 0.69 mg/dL — ABNORMAL LOW (ref 0.76–1.27)
Globulin, Total: 2.6 g/dL (ref 1.5–4.5)
Glucose: 98 mg/dL (ref 65–99)
Potassium: 4.2 mmol/L (ref 3.5–5.2)
Sodium: 144 mmol/L (ref 134–144)
Total Protein: 6.9 g/dL (ref 6.0–8.5)
eGFR: 106 mL/min/{1.73_m2} (ref >59)

## 2020-10-05 LAB — CBC WITH DIFFERENTIAL/PLATELET
Basophils Absolute: 0 10*3/uL (ref 0.0–0.2)
Basos: 1 %
EOS (ABSOLUTE): 0.3 10*3/uL (ref 0.0–0.4)
Eos: 4 %
Hematocrit: 38.4 % (ref 37.5–51.0)
Hemoglobin: 13.1 g/dL (ref 13.0–17.7)
Immature Grans (Abs): 0 10*3/uL (ref 0.0–0.1)
Immature Granulocytes: 1 %
Lymphocytes Absolute: 1.1 10*3/uL (ref 0.7–3.1)
Lymphs: 18 %
MCH: 32.1 pg (ref 26.6–33.0)
MCHC: 34.1 g/dL (ref 31.5–35.7)
MCV: 94 fL (ref 79–97)
Monocytes Absolute: 0.6 10*3/uL (ref 0.1–0.9)
Monocytes: 9 %
Neutrophils Absolute: 4.2 10*3/uL (ref 1.4–7.0)
Neutrophils: 67 %
Platelets: 168 10*3/uL (ref 150–450)
RBC: 4.08 x10E6/uL — ABNORMAL LOW (ref 4.14–5.80)
RDW: 12.7 % (ref 11.6–15.4)
WBC: 6.2 10*3/uL (ref 3.4–10.8)

## 2020-10-05 NOTE — Telephone Encounter (Signed)
Pt informed lab results okay understanding verbalized no other concerns voiced.

## 2020-10-05 NOTE — Telephone Encounter (Signed)
-----  Message from Elsie Stain, MD sent at 10/05/2020 11:59 AM EDT ----- Let pt know liver, kidney normal blood counts ok

## 2020-10-06 ENCOUNTER — Other Ambulatory Visit: Payer: Self-pay

## 2020-10-07 ENCOUNTER — Other Ambulatory Visit: Payer: Self-pay

## 2020-10-19 IMAGING — DX DG CHEST 2V
2 series · 2 of 2 positions shown · non-contrast
Comparison: February 08, 2018

CLINICAL DATA: Chest pain and fatigue.  Decreased oxygen saturation

EXAM:
CHEST - 2 VIEW

[w chest pa]
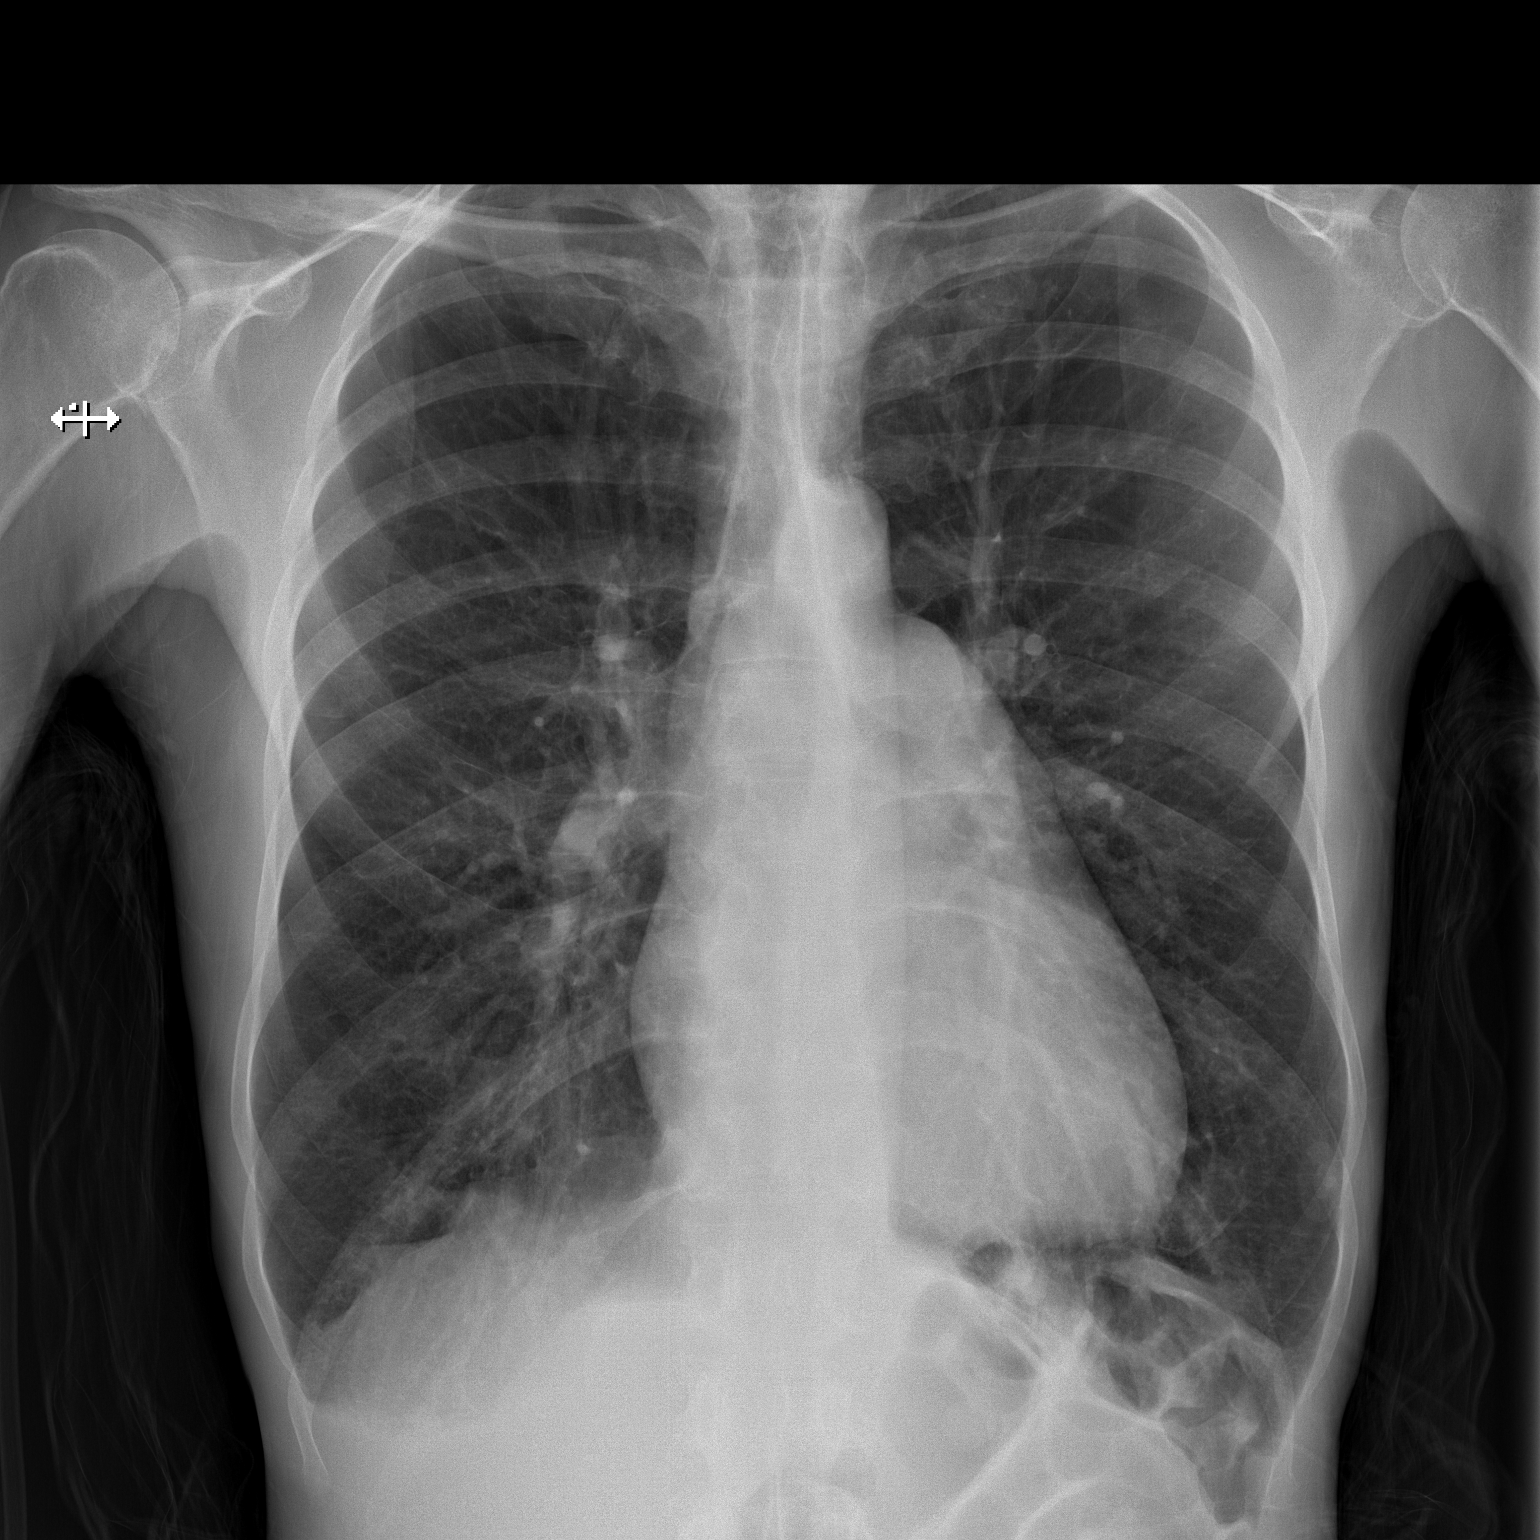

[w chest lat]
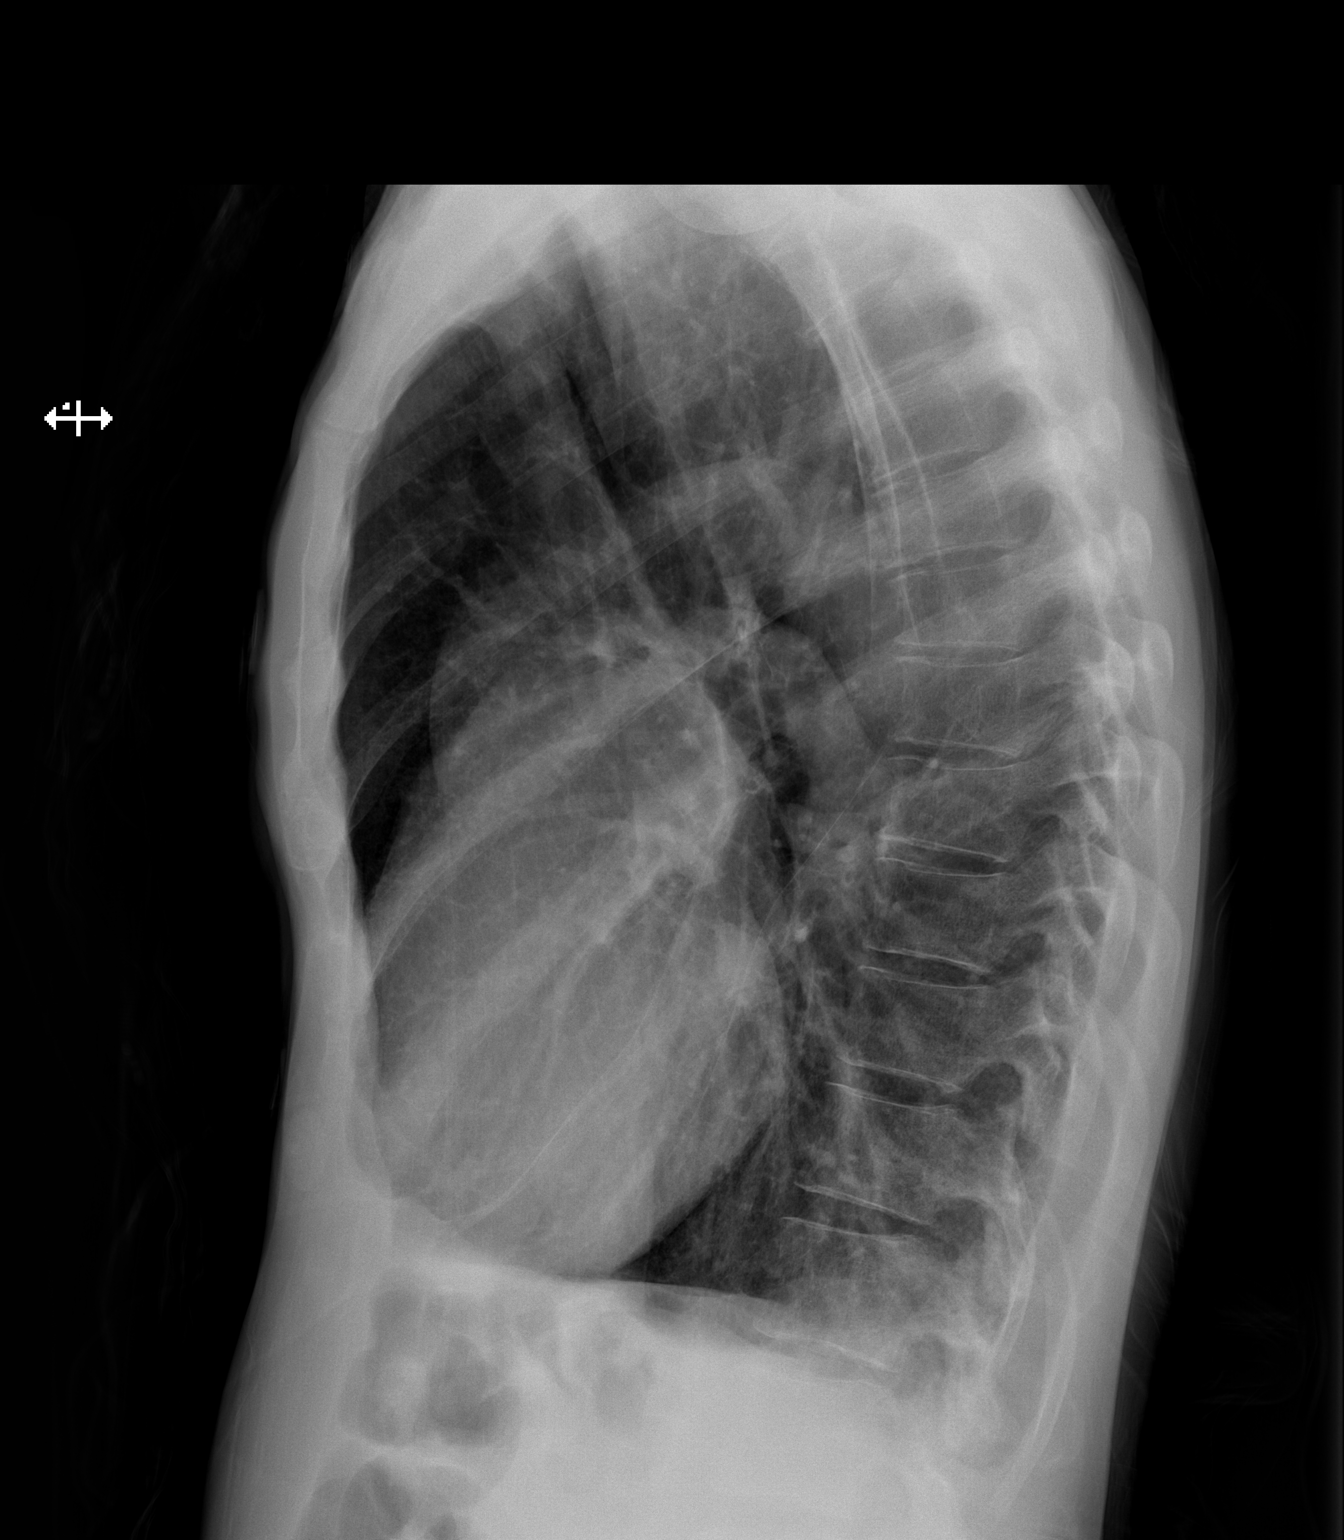

[2 of 2 positions shown; findings below may reference images not displayed]

FINDINGS: There is no evident edema or consolidation. Heart is borderline
enlarged. There is prominence of the main pulmonary arteries with
rapid peripheral tapering. No adenopathy. No bone lesions.
IMPRESSION: Borderline cardiomegaly with evidence of a degree of pulmonary
arterial hypertension. There is no demonstrable edema or
consolidation. No adenopathy appreciable.

## 2020-10-30 DIAGNOSIS — J441 Chronic obstructive pulmonary disease with (acute) exacerbation: Secondary | ICD-10-CM | POA: Diagnosis not present

## 2020-11-03 ENCOUNTER — Other Ambulatory Visit: Payer: Self-pay

## 2020-11-22 ENCOUNTER — Other Ambulatory Visit: Payer: Self-pay

## 2020-11-22 ENCOUNTER — Telehealth: Payer: Self-pay | Admitting: Critical Care Medicine

## 2020-11-22 MED FILL — Albuterol Sulfate Inhal Aero 108 MCG/ACT (90MCG Base Equiv): RESPIRATORY_TRACT | 25 days supply | Qty: 8.5 | Fill #1 | Status: AC

## 2020-11-22 MED FILL — Famotidine Tab 20 MG: ORAL | 30 days supply | Qty: 60 | Fill #1 | Status: AC

## 2020-11-22 NOTE — Telephone Encounter (Signed)
Pt would like to talk to nurse about this medication  Glycopyrrolate-Formoterol (BEVESPI AEROSPHERE) 9-4.8 MCG/ACT AERO [[id number]] and how he has some meds that provider cancelled. Please follow up with pt regarding issue.

## 2020-11-23 ENCOUNTER — Other Ambulatory Visit: Payer: Self-pay

## 2020-11-23 NOTE — Telephone Encounter (Signed)
Returned pt call to go over provider response pt is aware and doesn't have any questions or concerns

## 2020-11-23 NOTE — Telephone Encounter (Signed)
The new inhaler has a compound identical to incruse.  He should not be on the same med twice. Will cause toxicity

## 2020-11-23 NOTE — Telephone Encounter (Signed)
Pt would like to take incruse as well as Bevespi.

## 2020-11-30 DIAGNOSIS — J441 Chronic obstructive pulmonary disease with (acute) exacerbation: Secondary | ICD-10-CM | POA: Diagnosis not present

## 2020-12-04 NOTE — Progress Notes (Signed)
Subjective:    Patient ID: Anthony Nelson, male    DOB: 1961/05/10, 60 y.o.   MRN: [id number]   06/09/2019 This is a 60 year old male with gold stage D COPD on home oxygen.  The patient's been followed over time by the pulmonary clinic with Anthony Nelson.  His last visit was in 2019.  The patient is still actively smoking at this time.  The patient has been on a variety of inhalers but currently takes Incruse daily and Symbicort 160  twoinhalations twice daily.  Patient states overall his level of dyspnea is stable he states he has minimal cough.  He is not had recent exacerbations.  He does state when he ran out of his inhalers and was under a lot of stress he started smoking again his breathing worsened.  He is trying to go back to the nicotine patches and focusing on smoking cessation.  He does not have excess secretions at this time.  Pulmonary functions done in 2019 revealed FEV1 of less than 30% predicted consistent with gold stage D COPD  Note the patient also has pulmonary hypertension and cor pulmonale on the basis of the patient's COPD  The patient is on oxygen 2 L continuous at home   06/24/2019 This is the first in office visit as the last visit was a telephone visit.  This patient has gold stage IV COPD still actively smoking.  He has had issues with access to some of the inhalers however we were able to achieve at the last telephone visit patient assistance for Symbicort 160 and Incruse and is on both these medicines at this time.  He also maintains oxygen 2 L continuous.  Note he had been hospitalized in October.  During the October admission he had significant fluid retention and evidence of pulmonary hypertension with left ventricular hypertrophy  Today he notes he has increased weight and increased swelling in the feet and has not been using his furosemide on a regular basis.  He is under a great deal stress because in April of this year his son was apparently murdered and he and his  wife both of been in significant depression over this  He comes in today with oxygen saturation of 82% walking from the lobby into the exam room on 2 L  He is still smoking 3 to 4 cigarettes daily Review shortness of breath assessment below  08/11/2019  This patient returns today in follow-up for advanced COPD has continued to lose weight is down to 115 pounds with a BMI of 15.6  He has early satiety and has increased gas retention and aerophagia.  He maintains Symbicort at 2 inhalations twice daily and Incruse 1 puff a day  The patient also is using furosemide for chronic diastolic heart failure and does maintain an increased level of oxygen from 2 L rest to 4 L exertion  With the use of nicotine replacement therapy the patient is down to 4 cigarettes daily he does not drink alcohol  The mucus he is producing is clear with a less productive cough noted  The patient still has some element of wheezing  11/12/2019 Patient is seen in return follow-up for chronic obstructive lung disease and unexplained weight loss. Patient states his level of dyspnea is at baseline.  He is maintaining Symbicort twice daily and Incruse daily.  He does maintain furosemide for diastolic heart failure and oxygen 2 L rest for exertion.  He does have stage IV COPD based on previous spirometry.  Patient still smoking 3 cigarettes daily.  He is applying for disability remains anxious over this.  Wt Readings from Last 3 Encounters: 11/12/19 : 116 lb (52.6 kg) 08/11/19 : 115 lb (52.2 kg) 06/24/19 : 121 lb (54.9 kg)  Note at the last visit he had an elevated CEA level and we ordered a CT chest and abdomen that this is yet to be completed  11/9 This patient is seen return follow-up for Gold's stage D COPD with continued smoking however now the patient is no longer smoking at all and of congratulated him on this.  He still has dyspnea on exertion still has minimal mucus production maintains oxygen 2 L also has CPAP at  night see shortness of breath assessment below  The patient's weight loss work-up was unrevealing and he is actually gained 13 pounds since the last visit.  He is using Ensure supplements. Wt Readings from Last 3 Encounters: 05/10/20 : 129 lb 3.2 oz (58.6 kg) 04/06/20 : 122 lb 6.4 oz (55.5 kg) 03/25/20 : 113 lb 9.6 oz (51.5 kg)  10/04/2020 This patient is seen in return follow-up have been admitted in March for COPD exacerbation and acute hypercarbic hypoxic respiratory failure.  Note when he was admitted previously in September 2021 he was supposedly discharged on her home BiPAP unit but instead he was given a CPAP unit from the homecare company.  He does maintain oxygen now 24/7 2 L from the last hospitalization.  He is no longer smoking cigarettes at this time.  He still is dyspneic with exertion but is improved compared to his March hospitalization.  He now has full Medicaid.  He has had a previous pulmonary physician at the lobe our practice but has not seen him in over 2 years now that he has Medicaid he wishes to return for evaluations.  He is requesting an evaluation to see if he should be on BiPAP and also whether he be a candidate for the inogen portable oxygen concentrator.  Below is a copy of his discharge summary Admit date: 09/02/2020 Discharge date: 09/07/2020  Admitted From: Home  Disposition:  Home  Recommendations for Outpatient Follow-up and new medication changes:  1. Follow up with Dr. Joya Nelson in 7 days.  2. Continue using Home Bipap  3. Supplemental 02 per Anthony Nelson to keep oxygen saturation more than 88%   Home Health: no   Equipment/Devices: home 02    Discharge Condition: stable  CODE STATUS: full  Diet recommendation: heart healthy diet.   Brief/Interim Summary: Anthony Nelson was admitted to the hospital with a working diagnosis of acute on chronic hypoxic respiratory failure due to COPD exacerbation, complicated by acute metabolic encephalopathy.  60 year old male  past medical history for aortic atherosclerosis, COPD, chronic diastolic heart failure, pulmonary hypertension, chronic hypoxic-hypercapnic respiratory failure (home Bipap), and GERD who presented to the hospital with progressive dyspnea associated with increased sputum production, cough and oxygen requirements.  On his initial physical examination his temperature was 98.5, his heart rate was 90, respiratory rate 22, blood pressure 143/90, oxygen saturation 98% on 3 L/min per .  He had decreased breath sounds bilaterally, positive wheezing, tachypnea and increased work of breathing, heart S1-S2, present, tachycardic, abdomen soft, no lower extremity edema.  Sodium 135, potassium 4.1, chloride 85, bicarb 42, glucose 133, BUN 10, creatinine 0.62,  ABG pH 7.26, P CO2 110, PO2 99.8, bicarb 47.7, oxygen saturation 97.3.  White cell count 6.4, hemoglobin 14.2, hematocrit 45.6, platelets 206. SARS COVID-19 negative. Chest radiograph  with significant hyperinflation.\ EKG 90 bpm, rightward axis, normal intervals, sinus rhythm, no significant ST segment or T wave changes.  Patient developed altered mentation the first day of his hospitalization, he was unresponsive to verbal or pain stimuli.  Follow-up ABG showed worsening respiratory acidosis with a pH of 7.11, PCO2 greater than 110.  He was placed on noninvasive mechanical ventilation, aggressive bronchodilator therapy, systemic corticosteroids and doxycycline.  On March 6 his arterial blood gas showed pH 7.44, PCO2 65.6, PO2 148, bicarb 24.4, oxygen saturation at 99.2%.  His mentation and respiratory failure slowly improved.  At the time of his discharge he was back on supplemental oxygen per nasal cannula. His diuretic regimen had resolved with good toleration.  1. Acute on chronic hypoxic and hypercapnic respiratory failure due to COPD exacerbation/complicated with acute metabolic encephalopathy.  Patient was admitted to the progressive care unit,  he received aggressive medical therapy with bronchodilators, systemic steroids and oral antibiotics. He was placed on noninvasive mechanical ventilation with improvement of his respiratory failure.  He will continue slow taper of prednisone, continue supplemental oxygen per nasal cannula, target oxygen saturation greater than 88%. He was encouraged to continue using his home BiPAP. Continue bronchodilator therapy,albuterol, LABA, LAMA and inhaled corticosteroids.   By the time of his discharge his mentation has returned to baseline.  2.  Chronic diastolic heart failure, pulmonary hypertension, cor pulmonale.  His echocardiogram from 03/2020 showed severe RV failure, severely enlarged RV, mild to moderate tricuspid regurgitation, the LV systolic function preserved ejection fraction 60 to 65%. Continue gentle diuresis with furosemide, 20 mg daily. No signs of acute decompensation during the hospitalization.  3.  Dyslipidemia/atherosclerosis.  Continue with rosuvastatin and aspirin.  4.  Chronic anemia.  Hemoglobin/hematocrit remained stable.  5.  GERD.  Continue antiacid therapy.  Discharge Diagnoses:  Active Problems:   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)   Chronic diastolic heart failure (HCC)   Atherosclerosis of aorta (HCC)   COPD GOLD IV with acute exacerbation (HCC)   Macrocytosis   Acute on chronic respiratory failure with hypoxia and hypercapnia (HCC)   Acute hypercapnic respiratory failure (HCC)   Acute metabolic encephalopathy   12/05/20 Patient seen in return follow-up for COPD.  Patient states his dyspnea is at baseline.  Patient's currently on the Bevespi inhaler.  The patient is yet to see his pulmonary doctor Anthony Nelson appointment is pending.  Patient maintains oxygen 2 L continuous.  He is having dry nasal passages with bleeding from the nose.  He does not have humidification on his concentrator.  Patient does need a new  nebulizer machine the current one has no longer functioning. The patient has successfully quit smoking.  There have been no other ER or hospital visits since his last visit in March  Shortness of Breath This is a chronic problem. The current episode started more than 1 year ago. The problem occurs daily. The problem has been unchanged. Associated symptoms include sputum production. Pertinent negatives include no chest pain, claudication, fever, headaches, hemoptysis, leg swelling, PND, rhinorrhea, sore throat or wheezing. The symptoms are aggravated by any activity and weather changes. Associated symptoms comments: Mucus is clr. Risk factors include smoking. He has tried beta agonist inhalers, ipratropium inhalers and steroid inhalers for the symptoms. The treatment provided significant relief. His past medical history is significant for COPD.    Past Medical History:  Diagnosis Date  . Atherosclerosis of aorta ([place]) 04/06/2020  . Bronchitis   . COPD (  chronic obstructive pulmonary disease) ([place])   . Diastolic heart failure ([place])   . GERD (gastroesophageal reflux disease)   . Pulmonary hypertension ([place])   . 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 ([place]) 04/10/2018  . Respiratory failure with hypoxia (HCC)      Family History  Problem Relation Age of Onset  . Cancer Mother   . Cancer Father   . CAD Father   . Heart failure Father   . Diabetes Maternal Grandmother   . Kidney disease Maternal Grandmother   . Kidney cancer Paternal Grandmother   . Heart attack Paternal Grandfather      Social History   Socioeconomic History  . Marital status: Married    Spouse name: Ruven Corradi  . Number of children: 3  . Years of education: 16  . Highest education level: Bachelor's degree (e.g., BA, AB, BS)  Occupational History  . Occupation: Presenter, broadcasting  Tobacco Use  . Smoking status: Former Smoker    Packs/day: 0.10    Years: 44.00    Pack years: 4.40     Types: Cigarettes    Quit date: 07/20/2020    Years since quitting: 0.3  . Smokeless tobacco: Never Used  Vaping Use  . Vaping Use: Never used  Substance and Sexual Activity  . Alcohol use: Not Currently    Comment: Socially   . Drug use: No  . Sexual activity: Not Currently  Other Topics Concern  . Not on file  Social History Narrative  . Not on file   Social Determinants of Health   Financial Resource Strain: Not on file  Food Insecurity: Not on file  Transportation Needs: Not on file  Physical Activity: Not on file  Stress: Not on file  Social Connections: Not on file  Intimate Partner Violence: Not on file     Allergies  Allergen Reactions  . Erythromycin Other (See Comments)    Allergy from childhood- does not take it     Outpatient Medications Prior to Visit  Medication Sig Dispense Refill  . albuterol (PROVENTIL) (2.5 MG/3ML) 0.083% nebulizer solution TAKE 3 MLS (2.5 MG TOTAL) BY NEBULIZATION EVERY 6 (SIX) HOURS AS NEEDED FOR WHEEZING OR SHORTNESS OF BREATH. 75 mL 2  . albuterol (VENTOLIN HFA) 108 (90 Base) MCG/ACT inhaler INHALE 2 PUFFS INTO THE LUNGS EVERY 6 HOURS AS NEEDED FOR WHEEZING OR SHORTNESS OF BREATH 8.5 g 2  . Ascorbic Acid (VITAMIN C) 100 MG tablet Take 100 mg by mouth daily.    Marland Kitchen aspirin EC 81 MG tablet Take 81 mg by mouth daily. Swallow whole.    . Cholecalciferol (VITAMIN D3) 50 MCG (2000 UT) TABS Take 2,000 Units by mouth daily.    . Cyanocobalamin (B-12 PO) Take 1 tablet by mouth daily.    Marland Kitchen dextromethorphan-guaiFENesin (MUCINEX DM) 30-600 MG 12hr tablet Take 1 tablet by mouth 2 (two) times daily as needed for cough.    . docusate sodium (COLACE) 100 MG capsule Take 1 capsule (100 mg total) by mouth daily. 10 capsule 0  . famotidine (PEPCID) 20 MG tablet TAKE 1 TABLET (20 MG TOTAL) BY MOUTH 2 (TWO) TIMES DAILY. (Patient taking differently: Take 20 mg by mouth 2 (two) times daily.) 60 tablet 3  . furosemide (LASIX) 20 MG tablet TAKE 1 TABLET (20  MG TOTAL) BY MOUTH DAILY. 90 tablet 1  . Glycopyrrolate-Formoterol (BEVESPI AEROSPHERE) 9-4.8 MCG/ACT AERO Inhale 1 puff into the lungs in the morning and at bedtime. 10.7 g 11  .  Multiple Vitamins-Minerals (ZINC PO) Take 1 tablet by mouth daily.    . OXYGEN Inhale 2 L/min into the lungs continuous.     . potassium chloride SA (KLOR-CON) 20 MEQ tablet TAKE 1 TABLET (20 MEQ TOTAL) BY MOUTH DAILY. 30 tablet 3  . rosuvastatin (CRESTOR) 20 MG tablet TAKE 1 TABLET (20 MG TOTAL) BY MOUTH DAILY. 30 tablet 4   No facility-administered medications prior to visit.     Review of Systems  Constitutional: Negative for fever and unexpected weight change.  HENT: Positive for nosebleeds. Negative for rhinorrhea and sore throat.   Respiratory: Positive for sputum production and shortness of breath. Negative for cough, hemoptysis, chest tightness and wheezing.   Cardiovascular: Negative for chest pain, claudication, leg swelling and PND.  Gastrointestinal: Negative.   Genitourinary: Negative for frequency.  Musculoskeletal: Negative.   Neurological: Negative for headaches.  Psychiatric/Behavioral: Negative.        Objective:   Physical Exam    Vitals:   12/05/20 1501  BP: 121/72  Pulse: 87  SpO2: 97%  Weight: 143 lb 9.6 oz (65.1 kg)  Height: 6' (1.829 m)    Gen: Pleasant, thin, in no distress,  normal affect  ENT: No lesions,  mouth clear,  oropharynx clear, no postnasal drip, very dry mucous membranes Neck: No JVD, no TMG, no carotid bruits  Lungs: No use of accessory muscles, no dullness to percussion, distant breath sounds with hyperresonance to percussion, no wheezes noted  Cardiovascular: RRR, heart sounds normal, no murmur or gallops,no peripheral edema  Abdomen: soft and NT, no HSM,  BS normal  Musculoskeletal: No deformities, no cyanosis or clubbing  Neuro: alert, non focal  Skin: Warm, no lesions or rashes   Assessment & Plan:  I personally reviewed all images and lab  data in the [facility] system as well as any outside material available during this office visit and agree with the  radiology impressions.   COPD GOLD IV  Gold stage D COPD with FEV1 22% of predicted from 2019  Patient is now on the Bevespi 2 inhalations twice daily  He has a follow-up visit with pulmonary pending  Will obtain for the patient on new nebulizer and also humidification for his home oxygen system  Will also obtain saline spray humidification of the nasal passages  Chronic diastolic heart failure ([place]) Compensated diastolic heart failure with good control of blood pressure  Patient requesting return visit to cardiology referral made  Chronic respiratory failure with hypoxia and hypercapnia ([place]) Will place order for humidification for home concentrator  Former smoker I congratulated the patient on his smoking cessation   Daniela was seen today for copd.  Diagnoses and all orders for this visit:  Chronic diastolic heart failure ([place]) -     Cancel: Ambulatory referral to Cardiology -     Ambulatory referral to Cardiology  Atherosclerosis of aorta ([place]) -     Cancel: Ambulatory referral to Cardiology -     Ambulatory referral to Cardiology  Chronic respiratory failure with hypoxia and hypercapnia ([place]) -     For home use only DME oxygen  COPD GOLD IV  -     For home use only DME Nebulizer machine  Former smoker  Other orders -     sodium chloride (OCEAN) 0.65 % SOLN nasal spray; Place 2 sprays into both nostrils in the morning, at noon, and at bedtime.  Will give patient his second Shingrix vaccine this visit, he will then obtain his COVID  booster, he will return in 1 month for a pneumococcal vaccine, he will turn in his fecal occult study for cancer screening  I spent 36 minutes on this patient face-to-face interview reviewing records going over his care gaps complex decision making ordering new nebulizer oxygen concentrator humidification and making cardiology  referral

## 2020-12-05 ENCOUNTER — Other Ambulatory Visit: Payer: Self-pay

## 2020-12-05 ENCOUNTER — Ambulatory Visit: Payer: Medicaid Other | Attending: Critical Care Medicine | Admitting: Critical Care Medicine

## 2020-12-05 ENCOUNTER — Encounter: Payer: Self-pay | Admitting: Critical Care Medicine

## 2020-12-05 VITALS — BP 121/72 | HR 87 | Ht 72.0 in | Wt 143.6 lb

## 2020-12-05 DIAGNOSIS — J9611 Chronic respiratory failure with hypoxia: Secondary | ICD-10-CM

## 2020-12-05 DIAGNOSIS — J449 Chronic obstructive pulmonary disease, unspecified: Secondary | ICD-10-CM | POA: Diagnosis not present

## 2020-12-05 DIAGNOSIS — Z87891 Personal history of nicotine dependence: Secondary | ICD-10-CM

## 2020-12-05 DIAGNOSIS — I700 Atherosclerosis of aorta: Secondary | ICD-10-CM

## 2020-12-05 DIAGNOSIS — I5032 Chronic diastolic (congestive) heart failure: Secondary | ICD-10-CM | POA: Diagnosis not present

## 2020-12-05 DIAGNOSIS — J9612 Chronic respiratory failure with hypercapnia: Secondary | ICD-10-CM

## 2020-12-05 MED ORDER — SALINE SPRAY 0.65 % NA SOLN
2.0000 | Freq: Three times a day (TID) | NASAL | 3 refills | Status: DC
  Filled 2020-12-05 – 2021-07-03 (×2): qty 104, 347d supply, fill #0

## 2020-12-05 NOTE — Assessment & Plan Note (Signed)
Gold stage D COPD with FEV1 22% of predicted from 2019  Patient is now on the Bevespi 2 inhalations twice daily  He has a follow-up visit with pulmonary pending  Will obtain for the patient on new nebulizer and also humidification for his home oxygen system  Will also obtain saline spray humidification of the nasal passages

## 2020-12-05 NOTE — Assessment & Plan Note (Signed)
Will place order for humidification for home concentrator

## 2020-12-05 NOTE — Assessment & Plan Note (Signed)
I congratulated the patient on his smoking cessation

## 2020-12-05 NOTE — Patient Instructions (Signed)
Humidity will be ordered for your home concentrator for oxygen  From the nebulizer will be issued  Shingles vaccine was given today  Please obtain your COVID booster shot  We will bring you back end of the month to obtain a Pneumovax pneumococcal vaccine  Return to see Dr. [name] 3 months  Call Dr. Gustavus Bryant office for pulmonary follow-up and we will refer you back to Dr. Oval Linsey for cardiology

## 2020-12-05 NOTE — Assessment & Plan Note (Signed)
Compensated diastolic heart failure with good control of blood pressure  Patient requesting return visit to cardiology referral made

## 2020-12-29 ENCOUNTER — Ambulatory Visit: Payer: Medicaid Other | Admitting: Pharmacist

## 2020-12-30 DIAGNOSIS — J441 Chronic obstructive pulmonary disease with (acute) exacerbation: Secondary | ICD-10-CM | POA: Diagnosis not present

## 2021-01-03 ENCOUNTER — Ambulatory Visit: Payer: Medicaid Other | Admitting: Pharmacist

## 2021-01-09 ENCOUNTER — Ambulatory Visit (HOSPITAL_BASED_OUTPATIENT_CLINIC_OR_DEPARTMENT_OTHER): Payer: Medicaid Other | Admitting: Family

## 2021-01-18 ENCOUNTER — Other Ambulatory Visit: Payer: Self-pay

## 2021-01-18 ENCOUNTER — Other Ambulatory Visit: Payer: Self-pay | Admitting: Critical Care Medicine

## 2021-01-18 DIAGNOSIS — J449 Chronic obstructive pulmonary disease, unspecified: Secondary | ICD-10-CM

## 2021-01-18 MED ORDER — ALBUTEROL SULFATE HFA 108 (90 BASE) MCG/ACT IN AERS
2.0000 | INHALATION_SPRAY | Freq: Four times a day (QID) | RESPIRATORY_TRACT | 2 refills | Status: DC | PRN
  Filled 2021-01-18: qty 8.5, 25d supply, fill #0
  Filled 2021-07-03: qty 8.5, 25d supply, fill #1

## 2021-01-19 ENCOUNTER — Other Ambulatory Visit: Payer: Self-pay

## 2021-01-20 ENCOUNTER — Other Ambulatory Visit: Payer: Self-pay

## 2021-01-22 NOTE — Progress Notes (Deleted)
Office Visit    Patient Name: Anthony Nelson Date of Encounter: 01/22/2021  PCP:  Elsie Stain, MD   [place]  Cardiologist:  Skeet Latch, MD  Advanced Practice Provider:  No care team member to display Electrophysiologist:  None   Chief Complaint    Long [name] is a 60 y.o. male with a hx of COPD, severe pulmonary hypertension, aortic atherosclerosis, prior tobacco use presents today for ***   Past Medical History    Past Medical History:  Diagnosis Date   Atherosclerosis of aorta ([place]) 04/06/2020   Bronchitis    COPD (chronic obstructive pulmonary disease) (HCC)    Diastolic heart failure (HCC)    GERD (gastroesophageal reflux disease)    Pulmonary hypertension ([place])    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 ([place]) 04/10/2018   Respiratory failure with hypoxia ([place])    Past Surgical History:  Procedure Laterality Date   HERNIA REPAIR      Allergies  Allergies  Allergen Reactions   Erythromycin Other (See Comments)    Allergy from childhood- does not take it    History of Present Illness    Anthony Nelson is a 60 y.o. male with a hx of  COPD, severe pulmonary hypertension, aortic atherosclerosis, prior tobacco use last seen 04/06/20 by Dr. Oval Linsey.  Initially evaluated in 2019 for pulmonary hypertension after admission 01/2018 for COPD exacerbation. Echo that admission with LVEF 60-65%, gr1DD, moderately dilated RV with moderate-severely reduced systolic function, PASP 80 mmHg. BNP 825. Treated with IV steroids and inhalers, He was diuresed but required oxygen at discharge.   He was admitted 03/2020 with hypoxic respiratory failure. Echo during admission LVEF 34-19%, normal diastolic function, severe RV failure, PASP 45 mmHg. When seen in clinic 04/2020 was feeling improved and trying to wear his BIPAP.   He was admitted 08/2020 with acute on chronic hypoxic and hypercapnic respiratory failure due to COPD  exacerbation complicated by acute metabolic encephalopathy.  He was treated with bronchdilators, systemic steroids, and oral antibiotics.   ***  EKGs/Labs/Other Studies Reviewed:   The following studies were reviewed today: ***  EKG:  EKG is  ordered today.  The ekg ordered today demonstrates ***  Recent Labs: 03/08/2020: B Natriuretic Peptide 1,235.5 09/05/2020: Magnesium 2.0 10/04/2020: ALT 25; BUN 10; Creatinine, Ser 0.69; Hemoglobin 13.1; Platelets 168; Potassium 4.2; Sodium 144  Recent Lipid Panel    Component Value Date/Time   CHOL 238 (H) 04/06/2020 1233   TRIG 74 04/06/2020 1233   HDL 112 04/06/2020 1233   CHOLHDL 2.1 04/06/2020 1233   LDLCALC 114 (H) 04/06/2020 1233    Home Medications   No outpatient medications have been marked as taking for the 01/23/21 encounter (Appointment) with Loel Dubonnet, NP.     Review of Systems     All other systems reviewed and are otherwise negative except as noted above.  Physical Exam    VS:  There were no vitals taken for this visit. , BMI There is no height or weight on file to calculate BMI.  Wt Readings from Last 3 Encounters:  12/05/20 143 lb 9.6 oz (65.1 kg)  10/04/20 132 lb 6.4 oz (60.1 kg)  09/07/20 117 lb 8.1 oz (53.3 kg)     GEN: Well nourished, well developed, in no acute distress. HEENT: normal. Neck: Supple, no JVD, carotid bruits, or masses. Cardiac: ***RRR, no murmurs, rubs, or gallops. No clubbing, cyanosis, edema.  ***Radials/PT 2+ and equal bilaterally.  Respiratory:  ***Respirations regular and unlabored, clear to auscultation bilaterally. GI: Soft, nontender, nondistended. MS: No deformity or atrophy. Skin: Warm and dry, no rash. Neuro:  Strength and sensation are intact. Psych: Normal affect.  Assessment & Plan    Severe pulmonary hypertension (WHO class 3) / Tobacco use / BIPAP -  Aorta atherosclerosis -  COPD -   Disposition: Follow up {follow up:15908} with Dr. Casimiro Needle or  APP.  Signed, Loel Dubonnet, NP 01/22/2021, 8:27 PM [place]

## 2021-01-23 ENCOUNTER — Ambulatory Visit (HOSPITAL_BASED_OUTPATIENT_CLINIC_OR_DEPARTMENT_OTHER): Payer: Medicaid Other | Admitting: Family

## 2021-01-26 ENCOUNTER — Telehealth (INDEPENDENT_AMBULATORY_CARE_PROVIDER_SITE_OTHER): Payer: Self-pay

## 2021-01-26 NOTE — Telephone Encounter (Signed)
Copied from [place] [id number]. Topic: General - Other >> Dec 30, 2020  4:34 PM Wynetta Emery, [place] C wrote: Reason for CRM: pt called in to reschedule his apt for Tuesday 7/5 with Lurena Joiner,  pt says that he is still out of town.   Please assist pt further.     Left message for patient to call back and schedule appointment. Nat Christen, CMA

## 2021-01-26 NOTE — Progress Notes (Signed)
Office Visit    Patient Name: Anthony Nelson Date of Encounter: 01/27/2021  PCP:  Anthony Stain, MD   [place]  Cardiologist:  Anthony Latch, MD  Advanced Practice Provider:  No care team member to display Electrophysiologist:  None   Chief Complaint    Anthony Nelson is a 60 y.o. male with a hx of COPD, severe pulmonary hypertension (WHO group 3) with right heart failure, aortic atherosclerosis, prior tobacco presents today for follow up of pulmonary hypertension   Past Medical History    Past Medical History:  Diagnosis Date   Atherosclerosis of aorta ([place]) 04/06/2020   Bronchitis    COPD (chronic obstructive pulmonary disease) (HCC)    Diastolic heart failure (HCC)    GERD (gastroesophageal reflux disease)    Pulmonary hypertension ([place])    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 ([place]) 04/10/2018   Respiratory failure with hypoxia ([place])    Past Surgical History:  Procedure Laterality Date   HERNIA REPAIR      Allergies  Allergies  Allergen Reactions   Erythromycin Other (See Comments)    Allergy from childhood- does not take it    History of Present Illness    Anthony Nelson is a 60 y.o. male with a hx of COPD, severe pulmonary hypertension (WHO group 3) with right heart failure, aortic atherosclerosis, prior tobacco use last seen 04/06/20 by Dr. Oval Nelson.  Initially evaluated in 2019 for pulmonary hypertension.  He was admitted August 2019 COPD exacerbation.  Chest CT was negative for PE but suggested elevated right heart disease.  It also showed moderate emphysema.  Echo during admission LVEF 60 to 40%, grade 1 diastolic dysfunction, moderately dilated RV with moderate to severely reduced systolic function.  PASP 80 mmHg.  He was treated with IV steroids and inhalers.  He was also diuresed but continued to have oxygen requirement at discharge.  Since that time he has been able to come off of oxygen.  He was  seen in follow-up October 2019 to follow-up in 2 months but that was not completed.  Admitted September 2021 with hypoxic respiratory failure.  Echo during admission LVEF 60 to 98%, normal diastolic dysfunction, severe RV failure, PASP 45 mmHg.  Seen in hospital follow-up October 2021 by Dr. Oval Nelson.  He had quit smoking cold [place].  He was still struggling to wear his BiPAP.  He was hospitalized September 18, 2020 for acute on chronic hypoxic respiratory failure due to COPD exacerbation complicated by acute metabolic encephalopathy.  He had no acute decompensation of his heart failure during admission.  He was treated with bronchodilators, systemic steroids, oral antibiotics.  He presents today for follow-up. He enjoys spending time with his 72 year old granddaughter. Tells me he has good days and bad days in relation to his breathing. Tells me for the most part it is better. He continues to wear 3L of O2. He notes his breathing is worse when he is stressed or rushed. He notes palpitations only if he does more than he usually does. He will hear his heart rate in his ear. Notes no chest pain, pressure, tightness.   EKGs/Labs/Other Studies Reviewed:   The following studies were reviewed today:  Echo 03/09/2020: IMPRESSIONS   1. Severe RV failure.   2. Left ventricular ejection fraction, by estimation, is 60 to 65%. The  left ventricle has normal function. The left ventricle has no regional  wall motion abnormalities. Left ventricular diastolic parameters were  normal.   3. Right ventricular systolic function is severely reduced. The right  ventricular size is severely enlarged. There is moderately elevated  pulmonary artery systolic pressure.   4. The mitral valve is normal in structure. Trivial mitral valve  regurgitation. No evidence of mitral stenosis.   5. Tricuspid valve regurgitation is mild to moderate.   6. The aortic valve is tricuspid. Aortic valve regurgitation is not  visualized. Mild  aortic valve sclerosis is present, with no evidence of  aortic valve stenosis.   7. The inferior vena cava is dilated in size with <50% respiratory  variability, suggesting right atrial pressure of 15 mmHg.  EKG:  EKG is  ordered today.  The ekg ordered today demonstrates NSR 89 bpm with occasional PVC, sinus arrhythmia. No acute ST/T wave changes. Pulmonary disease pattern.  Recent Labs: 03/08/2020: B Natriuretic Peptide 1,235.5 09/05/2020: Magnesium 2.0 10/04/2020: ALT 25; BUN 10; Creatinine, Ser 0.69; Hemoglobin 13.1; Platelets 168; Potassium 4.2; Sodium 144  Recent Lipid Panel    Component Value Date/Time   CHOL 238 (H) 04/06/2020 1233   TRIG 74 04/06/2020 1233   HDL 112 04/06/2020 1233   CHOLHDL 2.1 04/06/2020 1233   LDLCALC 114 (H) 04/06/2020 1233   Home Medications   Current Meds  Medication Sig   albuterol (PROAIR HFA) 108 (90 Base) MCG/ACT inhaler INHALE 2 PUFFS INTO THE LUNGS EVERY 6 HOURS AS NEEDED FOR WHEEZING OR SHORTNESS OF BREATH   Ascorbic Acid (VITAMIN C) 100 MG tablet Take 100 mg by mouth daily.   aspirin EC 81 MG tablet Take 81 mg by mouth daily. Swallow whole.   Cholecalciferol (VITAMIN D3) 50 MCG (2000 UT) TABS Take 2,000 Units by mouth daily.   Cyanocobalamin (B-12 PO) Take 1 tablet by mouth daily.   dextromethorphan-guaiFENesin (MUCINEX DM) 30-600 MG 12hr tablet Take 1 tablet by mouth 2 (two) times daily as needed for cough.   docusate sodium (COLACE) 100 MG capsule Take 1 capsule (100 mg total) by mouth daily.   famotidine (PEPCID) 20 MG tablet TAKE 1 TABLET (20 MG TOTAL) BY MOUTH 2 (TWO) TIMES DAILY.   furosemide (LASIX) 20 MG tablet TAKE 1 TABLET (20 MG TOTAL) BY MOUTH DAILY.   Glycopyrrolate-Formoterol (BEVESPI AEROSPHERE) 9-4.8 MCG/ACT AERO Inhale 1 puff into the lungs in the morning and at bedtime.   Multiple Vitamins-Minerals (ZINC PO) Take 1 tablet by mouth daily.   OXYGEN Inhale 2 L/min into the lungs continuous.    potassium chloride SA (KLOR-CON) 20 MEQ  tablet TAKE 1 TABLET (20 MEQ TOTAL) BY MOUTH DAILY.   rosuvastatin (CRESTOR) 20 MG tablet TAKE 1 TABLET (20 MG TOTAL) BY MOUTH DAILY.   sodium chloride (OCEAN) 0.65 % SOLN nasal spray Place 2 sprays into both nostrils in the morning, at noon, and at bedtime.     Review of Systems      All other systems reviewed and are otherwise negative except as noted above.  Physical Exam    VS:  BP 121/83   Pulse 89   Ht 6' (1.829 m)   Wt 142 lb 9.6 oz (64.7 kg)   SpO2 98%   BMI 19.34 kg/m  , BMI Body mass index is 19.34 kg/m.  Wt Readings from Last 3 Encounters:  01/27/21 142 lb 9.6 oz (64.7 kg)  12/05/20 143 lb 9.6 oz (65.1 kg)  10/04/20 132 lb 6.4 oz (60.1 kg)     GEN: Well nourished, well developed, in no acute distress. HEENT: normal. Neck: Supple,  no JVD, carotid bruits, or masses. Cardiac: RRR, no murmurs, rubs, or gallops. No clubbing, cyanosis, edema.  Radials/PT 2+ and equal bilaterally.  Respiratory:  Respirations regular and unlabored, clear to auscultation bilaterally. ON 2L O2 GI: Soft, nontender, nondistended. MS: No deformity or atrophy. Skin: Warm and dry, no rash. Neuro:  Strength and sensation are intact. Psych: Normal affect.  Assessment & Plan    Pulmonary hypertension WHO class III / right sided heart failure- Stable dyspnea on exertion. Euvolemic and well compensated. On chronic 2L home O2. Continue current dose Lasix 16m QD. Continue BIPAP.   Tobacco use - Congratulated on continuing to abstain.   Aortic atherosclerosis /HLD, LDL goal less than 70 - Stable with no anginal symptoms. No indication for ischemic evaluation.  04/2020 total cholesterol 238, HDL 112, LDL 114. ASA 83mand Rosuvastatin initiated at that time. Has upcoming follow up with primary care and anticipates lab work. If LDL not <70, plan to increase Rosuvastatin dose.   Disposition: Follow up in 6 month(s) with Dr. RaOval Linseyr APP.  Signed, CaLoel DubonnetNP 01/27/2021, 1:13 PM [place]

## 2021-01-27 ENCOUNTER — Encounter (HOSPITAL_BASED_OUTPATIENT_CLINIC_OR_DEPARTMENT_OTHER): Payer: Self-pay | Admitting: Family

## 2021-01-27 ENCOUNTER — Other Ambulatory Visit: Payer: Self-pay

## 2021-01-27 ENCOUNTER — Ambulatory Visit (INDEPENDENT_AMBULATORY_CARE_PROVIDER_SITE_OTHER): Payer: Medicaid Other | Admitting: Family

## 2021-01-27 VITALS — BP 121/83 | HR 89 | Ht 72.0 in | Wt 142.6 lb

## 2021-01-27 DIAGNOSIS — E785 Hyperlipidemia, unspecified: Secondary | ICD-10-CM | POA: Diagnosis not present

## 2021-01-27 DIAGNOSIS — I2723 Pulmonary hypertension due to lung diseases and hypoxia: Secondary | ICD-10-CM

## 2021-01-27 DIAGNOSIS — I50812 Chronic right heart failure: Secondary | ICD-10-CM

## 2021-01-27 DIAGNOSIS — J449 Chronic obstructive pulmonary disease, unspecified: Secondary | ICD-10-CM | POA: Diagnosis not present

## 2021-01-27 DIAGNOSIS — I700 Atherosclerosis of aorta: Secondary | ICD-10-CM

## 2021-01-27 NOTE — Patient Instructions (Addendum)
Medication Instructions:  Continue your current medications.   *If you need a refill on your cardiac medications before your next appointment, please call your pharmacy*  Lab Work: None ordered today.  Recommend having your cholesterol checked the next time you have lab work.   Testing/Procedures: Your EKG today showed normal sinus rhythm with occasional early beat called a PVC. These are not dangerous but can feel like your heart jumps or skips. They can happen if you are dehydrated, have had more caffeine than usual, or are under stress (physical or emotional).  Follow-Up: At [facility], you and your health needs are our priority.  As part of our continuing mission to provide you with exceptional heart care, we have created designated Provider Care Teams.  These Care Teams include your primary Cardiologist (physician) and Advanced Practice Providers (APPs -  Physician Assistants and Nurse Practitioners) who all work together to provide you with the care you need, when you need it.  We recommend signing up for the patient portal called "MyChart".  Sign up information is provided on this After Visit Summary.  MyChart is used to connect with patients for Virtual Visits (Telemedicine).  Patients are able to view lab/test results, encounter notes, upcoming appointments, etc.  Non-urgent messages can be sent to your provider as well.   To learn more about what you can do with MyChart, go to [email or web address].    Your next appointment:   February 2023 as scheduled with Dr. Oval [facility]sey at [facility]   Other Instructions  If you would like a referral to urology, simply let us know.  Heart Healthy Diet Recommendations: A low-salt diet is recommended. Meats should be grilled, baked, or boiled. Avoid fried foods. Focus on lean protein sources like fish or chicken with vegetables and fruits. The American Heart Association is a Microbiologist!  Exercise recommendations: The American Heart  Association recommends 150 minutes of moderate intensity exercise weekly. Try 30 minutes of moderate intensity exercise 4-5 times per week. This could include walking, jogging, or swimming.   Pulmonary Hypertension Pulmonary hypertension is a long-term (chronic) condition in which there is high blood pressure in the blood vessels of the lungs (pulmonary arteries). This condition occurs when pulmonary arteries become narrow and tight, making it harder for blood to flow through the lungs. This in turn makes the heart work harder to pump blood through the lungs, making it harder for you tobreathe. Over time, pulmonary hypertension can weaken and damage the heart muscle, specifically the right side of the heart. Pulmonary hypertension is a seriouscondition that can be life-threatening. What are the causes? This condition may be caused by different medical conditions. It can be categorized by cause into five groups: Group 1: Pulmonary hypertension that is caused when the arteries in the lungs become narrowed, thickened, or stiff (pulmonary arterial hypertension). This may happen with no known cause, or it may be: Passed from parent to child (hereditary). Caused by another disease, such as a connective tissue disease (including lupus or scleroderma), congenital heart disease, liver disease, or HIV (human immunodeficiency virus). Caused by certain medicines or poisons. Group 2: Pulmonary hypertension that is caused by weakness of the left chamber of the heart (left ventricle) or heart valve disease. Group 3: Pulmonary hypertension that is caused by chronic lung disease or low oxygen levels. Causes in this group include: Emphysema or chronic obstructive pulmonary disease (COPD). Untreated sleep apnea. Pulmonary fibrosis. Long-term exposure to high altitudes in certain people who may already be at higher  risk for pulmonary hypertension. Group 4: Pulmonary hypertension that is caused by blood clots in the  lungs. Group 5: Other causes of pulmonary hypertension, such as sickle cell anemia, sarcoidosis, abnormal growths of tissue (tumors) pressing on the pulmonary arteries, and various other diseases. What are the signs or symptoms? Symptoms of this condition include: Shortness of breath. You may notice shortness of breath with: Activity, such as walking. Minimal activity, such as getting dressed. No activity, like when you are sitting still. A cough. Sometimes, bloody mucus from the lungs may be coughed up. Tiredness. Dizziness, light-headedness, or fainting, especially with physical activity. Rapid heartbeat, or feeling your heart flutter or skip a beat (palpitations). Veins in the neck getting larger. Swelling of the lower legs, abdomen, or both. Bluish color of the lips and fingertips. Chest pain or tightness. Abdominal pain, especially in the upper abdomen. How is this diagnosed? This condition may be diagnosed based on one or more of the following tests: Blood tests. Imaging tests, such as: Chest X-ray. CT scan. Echocardiogram. This test uses sound waves (ultrasound) to produce an image of the heart. Ventilation-perfusion scan. This test uses radioactive material to test how well air moves and blood flows in and out of the lungs. Pulmonary function test. This test measures how much air your lungs can hold. It also tests how well air moves in and out of your lungs. 6-minute walk test. This tests how severe your condition is in relation to your activity levels. ECG (electrocardiogram). This test records the electrical impulses of the heart. Cardiac catheterization. This is a procedure in which a thin tube (catheter) is passed into the pulmonary artery and used to test the pressure in your pulmonary artery and the right side of your heart. Lung biopsy. This involves having a procedure to remove a small sample of lung tissue for testing. This may help determine an underlying cause of your  pulmonary hypertension. How is this treated? There is no cure for this condition, but treatment can help to relieve symptoms and slow the progress of the condition. Treatment may include: Cardiac rehabilitation. This is a treatment program that includes exercise training, education, and counseling to help you get stronger and return to an active lifestyle. Oxygen therapy. Medicines that: Lower blood pressure. Relax (dilate) the pulmonary blood vessels. Help the heart beat more efficiently and pump more blood. Help the body get rid of extra fluid. Thin the blood in order to prevent blood clots in the lungs. Lung surgery to relieve pressure on the heart. This may be needed for severe cases that do not respond to medical treatment. Heart-lung transplant, or lung transplant. This may be done in very severe cases. Follow these instructions at home: Eating and drinking  Eat a healthy diet that includes plenty of fresh fruits and vegetables, whole grains, and beans. Limit your salt (sodium) intake to less than 2,300 mg a day.  Activity Get plenty of rest. Exercise as directed. Talk with your health care provider about what type of exercise is safe for you. Avoid sitting in hot tubs or saunas for long periods of time. Avoid high altitudes. Lifestyle Do not use any products that contain nicotine or tobacco. These products include cigarettes, chewing tobacco, and vaping devices, such as e-cigarettes. If you need help quitting, ask your health care provider. Avoid secondhand smoke. General instructions Take over-the-counter and prescription medicines only as told by your health care provider. Do not change or stop medicines without checking with your health care  provider. Stay up to date on your vaccines, especially yearly flu (influenza) and pneumonia vaccines. If you are a woman of child-bearing age, avoid becoming pregnant. Talk with your health care provider about birth control. Consider ways  to get support for the anxiety and stress of living with pulmonary hypertension. Talk with your health care provider about support groups and online resources. Use oxygen therapy at home as directed. Keep track of your weight. Weight gain could be a sign that your condition is getting worse. Keep all follow-up visits. This is important. Contact a health care provider if: Your cough gets worse. You have more shortness of breath than usual, or you start to have trouble doing activities that you could do before. You need to use medicines or oxygen more frequently or in higher dosages than usual. Get help right away if: You have severe shortness of breath. You have chest pain or pressure. You cough up blood. You have swelling of your feet or legs that gets worse. You have rapid weight gain over a period of 1-2 days. Your medicines or oxygen do not provide relief. These symptoms may represent a serious problem that is an emergency. Do not wait to see if the symptoms will go away. Get medical help right away. Call your local emergency services (911 in the [place] Do not drive yourself to the hospital. Summary Pulmonary hypertension is a long-term (chronic) condition in which there is high blood pressure in the blood vessels of the lungs (pulmonary arteries). Pulmonary hypertension is a serious condition that can be life-threatening. It can be caused by a variety of illnesses. Treatment may involve taking medicines and using oxygen therapy. Severe cases may require surgery or a transplant. This information is not intended to replace advice given to you by your health care provider. Make sure you discuss any questions you have with your healthcare provider. Document Revised: 05/02/2020 Document Reviewed: 05/02/2020 Elsevier Patient Education  2022 [employer].

## 2021-01-30 DIAGNOSIS — J441 Chronic obstructive pulmonary disease with (acute) exacerbation: Secondary | ICD-10-CM | POA: Diagnosis not present

## 2021-03-02 DIAGNOSIS — J441 Chronic obstructive pulmonary disease with (acute) exacerbation: Secondary | ICD-10-CM | POA: Diagnosis not present

## 2021-03-06 NOTE — Progress Notes (Deleted)
Established Patient Office Visit  Subjective:  Patient ID: Anthony Nelson, male    DOB: 1960/08/06  Age: 60 y.o. MRN: [id number]  CC: No chief complaint on file.   HPI Quint Chestnut presents for *** COPD GOLD IV  Gold stage D COPD with FEV1 22% of predicted from 2019   Patient is now on the Bevespi 2 inhalations twice daily   He has a follow-up visit with pulmonary pending   Will obtain for the patient on new nebulizer and also humidification for his home oxygen system   Will also obtain saline spray humidification of the nasal passages   Chronic diastolic heart failure ([place]) Compensated diastolic heart failure with good control of blood pressure   Patient requesting return visit to cardiology referral made   Chronic respiratory failure with hypoxia and hypercapnia ([place]) Will place order for humidification for home concentrator   Former smoker I congratulated the patient on his smoking cessation     [place] was seen today for copd.   Diagnoses and all orders for this visit:   Chronic diastolic heart failure ([place]) -     Cancel: Ambulatory referral to Cardiology -     Ambulatory referral to Cardiology   Atherosclerosis of aorta ([place]) -     Cancel: Ambulatory referral to Cardiology -     Ambulatory referral to Cardiology   Chronic respiratory failure with hypoxia and hypercapnia ([place]) -     For home use only DME oxygen   COPD GOLD IV  -     For home use only DME Nebulizer machine   Former smoker   Other orders -     sodium chloride (OCEAN) 0.65 % SOLN nasal spray; Place 2 sprays into both nostrils in the morning, at noon, and at bedtime.   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 ([place]) 04/10/2018   Respiratory failure with hypoxia ([place])     Past Surgical History:  Procedure Laterality Date   HERNIA REPAIR      Family History  Problem Relation Age of Onset   Cancer Mother    Cancer Father    CAD Father    Heart failure Father    Diabetes Maternal Grandmother    Kidney disease Maternal Grandmother    Kidney cancer Paternal Grandmother    Heart attack Paternal Grandfather     Social History   Socioeconomic History   Marital status: Married    Spouse name: Karen Kinnard   Number of children: 3   Years of education: 16   Highest education level: Bachelor's degree (e.g., BA, AB, BS)  Occupational History   Occupation: Security Guard  Tobacco Use   Smoking status: Former    Packs/day: 0.10    Years: 44.00    Pack years: 4.40    Types: Cigarettes    Quit date: 07/20/2020    Years since quitting: 0.6   Smokeless tobacco: Never  Vaping Use   Vaping Use: Never used  Substance  and Sexual Activity   Alcohol use: Not Currently    Comment: Socially    Drug use: No   Sexual activity: Not Currently  Other Topics Concern   Not on file  Social History Narrative   Not on file   Social Determinants of Health   Financial Resource Strain: Not on file  Food Insecurity: Not on file  Transportation Needs: Not on file  Physical Activity: Not on file  Stress: Not on file  Social Connections: Not on file  Intimate Partner Violence: Not on file    Outpatient Medications Prior to Visit  Medication Sig Dispense Refill   albuterol (PROAIR HFA) 108 (90 Base) MCG/ACT inhaler INHALE 2 PUFFS INTO THE LUNGS EVERY 6 HOURS AS NEEDED FOR WHEEZING OR SHORTNESS OF BREATH 8.5 g 2   Ascorbic Acid (VITAMIN C) 100 MG tablet Take 100 mg by mouth daily.      aspirin EC 81 MG tablet Take 81 mg by mouth daily. Swallow whole.     Cholecalciferol (VITAMIN D3) 50 MCG (2000 UT) TABS Take 2,000 Units by mouth daily.     Cyanocobalamin (B-12 PO) Take 1 tablet by mouth daily.     dextromethorphan-guaiFENesin (MUCINEX DM) 30-600 MG 12hr tablet Take 1 tablet by mouth 2 (two) times daily as needed for cough.     docusate sodium (COLACE) 100 MG capsule Take 1 capsule (100 mg total) by mouth daily. 10 capsule 0   famotidine (PEPCID) 20 MG tablet TAKE 1 TABLET (20 MG TOTAL) BY MOUTH 2 (TWO) TIMES DAILY. 60 tablet 3   furosemide (LASIX) 20 MG tablet TAKE 1 TABLET (20 MG TOTAL) BY MOUTH DAILY. 90 tablet 1   Glycopyrrolate-Formoterol (BEVESPI AEROSPHERE) 9-4.8 MCG/ACT AERO Inhale 1 puff into the lungs in the morning and at bedtime. 10.7 g 11   Multiple Vitamins-Minerals (ZINC PO) Take 1 tablet by mouth daily.     OXYGEN Inhale 2 L/min into the lungs continuous.      potassium chloride SA (KLOR-CON) 20 MEQ tablet TAKE 1 TABLET (20 MEQ TOTAL) BY MOUTH DAILY. 30 tablet 3   rosuvastatin (CRESTOR) 20 MG tablet TAKE 1 TABLET (20 MG TOTAL) BY MOUTH DAILY. 30 tablet 4   sodium chloride (OCEAN) 0.65 % SOLN nasal spray Place 2 sprays into both nostrils in the morning, at noon, and at bedtime. 104 mL 3   No facility-administered medications prior to visit.    Allergies  Allergen Reactions   Erythromycin Other (See Comments)    Allergy from childhood- does not take it    ROS Review of Systems    Objective:    Physical Exam  There were no vitals taken for this visit. Wt Readings from Last 3 Encounters:  01/27/21 142 lb 9.6 oz (64.7 kg)  12/05/20 143 lb 9.6 oz (65.1 kg)  10/04/20 132 lb 6.4 oz (60.1 kg)     Health Maintenance Due  Topic Date Due   Pneumococcal Vaccine 79-26 Years old (1 - PCV) Never done   COVID-19 Vaccine (3 - Moderna risk series) 06/01/2020   Zoster Vaccines- Shingrix (2 of 2) 07/05/2020   COLON CANCER SCREENING ANNUAL FOBT  08/18/2020    INFLUENZA VACCINE  01/30/2021    There are no preventive care reminders to display for this patient.  Lab Results  Component Value Date   TSH 1.757 04/25/2019   Lab Results  Component Value Date   WBC 6.2 10/04/2020   HGB 13.1 10/04/2020   HCT 38.4 10/04/2020  MCV 94 10/04/2020   PLT 168 10/04/2020   Lab Results  Component Value Date   NA 144 10/04/2020   K 4.2 10/04/2020   CO2 31 (H) 10/04/2020   GLUCOSE 98 10/04/2020   BUN 10 10/04/2020   CREATININE 0.69 (L) 10/04/2020   BILITOT 0.3 10/04/2020   ALKPHOS 70 10/04/2020   AST 19 10/04/2020   ALT 25 10/04/2020   PROT 6.9 10/04/2020   ALBUMIN 4.3 10/04/2020   CALCIUM 9.0 10/04/2020   ANIONGAP 10 09/07/2020   EGFR 106 10/04/2020   Lab Results  Component Value Date   CHOL 238 (H) 04/06/2020   Lab Results  Component Value Date   HDL 112 04/06/2020   Lab Results  Component Value Date   LDLCALC 114 (H) 04/06/2020   Lab Results  Component Value Date   TRIG 74 04/06/2020   Lab Results  Component Value Date   CHOLHDL 2.1 04/06/2020   Lab Results  Component Value Date   HGBA1C 5.9 (H) 03/08/2020      Assessment & Plan:   Problem List Items Addressed This Visit   None   No orders of the defined types were placed in this encounter.   Follow-up: No follow-ups on file.    Asencion Noble, MD

## 2021-03-07 ENCOUNTER — Ambulatory Visit: Payer: Medicaid Other | Admitting: Critical Care Medicine

## 2021-03-09 ENCOUNTER — Other Ambulatory Visit: Payer: Self-pay

## 2021-03-09 ENCOUNTER — Other Ambulatory Visit: Payer: Self-pay | Admitting: Critical Care Medicine

## 2021-03-09 NOTE — Telephone Encounter (Signed)
Copied from [place] [id number]. Topic: Quick Communication - Rx Refill/Question >> Mar 09, 2021  9:40 AM Tessa Lerner A wrote: Medication: albuterol (PROVENTIL) (2.5 MG/3ML) 0.083% nebulizer solution 2.5 mg   [[id number]]  Has the patient contacted their pharmacy? Yes.   (Agent: If no, request that the patient contact the pharmacy for the refill.) (Agent: If yes, when and what did the pharmacy advise?)  Preferred Pharmacy (with phone number or street name): [facility] and [address]  Phone:  [phone] Fax:  [phone]  Agent: Please be advised that RX refills may take up to 3 business days. We ask that you follow-up with your pharmacy.

## 2021-03-10 ENCOUNTER — Other Ambulatory Visit: Payer: Self-pay

## 2021-03-10 MED ORDER — ALBUTEROL SULFATE (2.5 MG/3ML) 0.083% IN NEBU
3.0000 mL | INHALATION_SOLUTION | Freq: Four times a day (QID) | RESPIRATORY_TRACT | 2 refills | Status: DC | PRN
  Filled 2021-03-10: qty 150, 13d supply, fill #0

## 2021-03-10 NOTE — Telephone Encounter (Signed)
Requested medications are due for refill today NO  Requested medications are on the active medication list NO  Last visit 12/05/20  Future visit scheduled 05/02/21  Notes to clinic Med not on current med list, please assess.

## 2021-04-01 DIAGNOSIS — J441 Chronic obstructive pulmonary disease with (acute) exacerbation: Secondary | ICD-10-CM | POA: Diagnosis not present

## 2021-04-10 ENCOUNTER — Other Ambulatory Visit: Payer: Self-pay

## 2021-05-02 ENCOUNTER — Other Ambulatory Visit: Payer: Self-pay

## 2021-05-02 ENCOUNTER — Encounter: Payer: Self-pay | Admitting: Critical Care Medicine

## 2021-05-02 ENCOUNTER — Ambulatory Visit: Payer: Medicaid Other | Attending: Critical Care Medicine | Admitting: Critical Care Medicine

## 2021-05-02 VITALS — BP 111/72 | HR 86 | Resp 16 | Wt 144.0 lb

## 2021-05-02 DIAGNOSIS — I700 Atherosclerosis of aorta: Secondary | ICD-10-CM

## 2021-05-02 DIAGNOSIS — J9612 Chronic respiratory failure with hypercapnia: Secondary | ICD-10-CM

## 2021-05-02 DIAGNOSIS — I5032 Chronic diastolic (congestive) heart failure: Secondary | ICD-10-CM

## 2021-05-02 DIAGNOSIS — I2723 Pulmonary hypertension due to lung diseases and hypoxia: Secondary | ICD-10-CM | POA: Diagnosis not present

## 2021-05-02 DIAGNOSIS — J9611 Chronic respiratory failure with hypoxia: Secondary | ICD-10-CM | POA: Diagnosis not present

## 2021-05-02 DIAGNOSIS — Z87891 Personal history of nicotine dependence: Secondary | ICD-10-CM | POA: Diagnosis not present

## 2021-05-02 DIAGNOSIS — J449 Chronic obstructive pulmonary disease, unspecified: Secondary | ICD-10-CM | POA: Diagnosis not present

## 2021-05-02 DIAGNOSIS — J441 Chronic obstructive pulmonary disease with (acute) exacerbation: Secondary | ICD-10-CM | POA: Diagnosis not present

## 2021-05-02 DIAGNOSIS — Z1211 Encounter for screening for malignant neoplasm of colon: Secondary | ICD-10-CM

## 2021-05-02 DIAGNOSIS — Z23 Encounter for immunization: Secondary | ICD-10-CM | POA: Diagnosis not present

## 2021-05-02 DIAGNOSIS — G8929 Other chronic pain: Secondary | ICD-10-CM | POA: Diagnosis not present

## 2021-05-02 DIAGNOSIS — E782 Mixed hyperlipidemia: Secondary | ICD-10-CM

## 2021-05-02 DIAGNOSIS — M25511 Pain in right shoulder: Secondary | ICD-10-CM | POA: Diagnosis not present

## 2021-05-02 MED ORDER — BEVESPI AEROSPHERE 9-4.8 MCG/ACT IN AERO
12.0000 | INHALATION_SPRAY | Freq: Two times a day (BID) | RESPIRATORY_TRACT | 11 refills | Status: DC
  Filled 2021-05-02 – 2021-07-03 (×2): qty 10.7, 30d supply, fill #0

## 2021-05-02 MED ORDER — POTASSIUM CHLORIDE CRYS ER 20 MEQ PO TBCR
EXTENDED_RELEASE_TABLET | Freq: Every day | ORAL | 3 refills | Status: DC
  Filled 2021-05-02: qty 90, 90d supply, fill #0

## 2021-05-02 MED ORDER — FUROSEMIDE 20 MG PO TABS
ORAL_TABLET | Freq: Every day | ORAL | 1 refills | Status: DC
  Filled 2021-05-02: qty 90, 90d supply, fill #0

## 2021-05-02 MED ORDER — ROSUVASTATIN CALCIUM 20 MG PO TABS
ORAL_TABLET | Freq: Every day | ORAL | 4 refills | Status: DC
  Filled 2021-05-02: qty 90, 90d supply, fill #0

## 2021-05-02 MED ORDER — FAMOTIDINE 20 MG PO TABS
ORAL_TABLET | Freq: Two times a day (BID) | ORAL | 3 refills | Status: DC
  Filled 2021-05-02: qty 180, 90d supply, fill #0
  Filled 2021-07-03: qty 60, 30d supply, fill #0

## 2021-05-02 NOTE — Assessment & Plan Note (Signed)
Chronic pulmonary hypertension due to obstructive pulmonary disease stable this time continue oxygen therapy inhalers and BiPAP at night

## 2021-05-02 NOTE — Assessment & Plan Note (Signed)
As per aortic atherosclerosis assessment

## 2021-05-02 NOTE — Progress Notes (Signed)
Established Patient Office Visit  Subjective:  Patient ID: Anthony Nelson, male    DOB: 1960/07/30  Age: 60 y.o. MRN: [id number]  CC: Primary care follow-up   HPI Anthony Nelson presents for primary care visit.  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  He just saw cardiology in July.  He is on chronic 2 L of oxygen home oxygen and is on low-dose furosemide and BiPAP at night.  He also maintains his inhalers and is no longer smoking cigarettes for a year now.  Patient's biggest complaint is that of chronic shoulder pain on the right.  He played sports when he was younger.  He is uncertain whether he tore her rotator cuff or not but is been an issue for about 2 years.  He is not able to abduct the shoulder above the plane of the collarbone.  He has pain anteriorly in the shoulder as well.  He uses over-the-counter medications for this.  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[place]) 04/10/2018   Respiratory failure with hypoxia ([place])     Past Surgical History:  Procedure Laterality Date   HERNIA REPAIR      Family History  Problem Relation Age of Onset   Cancer Mother    Cancer Father    CAD Father    Heart failure Father    Diabetes Maternal Grandmother    Kidney disease Maternal Grandmother    Kidney cancer Paternal Grandmother    Heart attack Paternal Grandfather     Social History   Socioeconomic History   Marital status: Married    Spouse  name: Anthony Nelson   Number of children: 3   Years of education: 16   Highest education level: Bachelor's degree (e.g., BA, AB, BS)  Occupational History   Occupation: Presenter, broadcasting  Tobacco Use   Smoking status: Former    Packs/day: 0.10    Years: 44.00    Pack years: 4.40    Types: Cigarettes    Quit date: 07/20/2020    Years since quitting: 0.7   Smokeless tobacco: Never  Vaping Use   Vaping Use: Never used  Substance and Sexual Activity   Alcohol use: Not Currently    Comment: Socially    Drug use: No   Sexual activity: Not Currently  Other Topics Concern   Not on file  Social History Narrative   Not on file   Social Determinants of Health   Financial Resource Strain: Not on file  Food Insecurity: Not on file  Transportation Needs: Not on file  Physical Activity: Not on file  Stress: Not on file  Social Connections: Not on file  Intimate Partner Violence: Not on file    Outpatient Medications Prior to Visit  Medication Sig Dispense Refill   albuterol (PROAIR HFA) 108 (90 Base) MCG/ACT inhaler INHALE 2 PUFFS INTO THE LUNGS EVERY 6 HOURS AS NEEDED FOR WHEEZING OR SHORTNESS OF BREATH 8.5 g  2   albuterol (PROVENTIL) (2.5 MG/3ML) 0.083% nebulizer solution TAKE 3 MLS (2.5 MG TOTAL) BY NEBULIZATION EVERY 6 (SIX) HOURS AS NEEDED FOR WHEEZING OR SHORTNESS OF BREATH. 75 mL 2   Ascorbic Acid (VITAMIN C) 100 MG tablet Take 100 mg by mouth daily.     aspirin EC 81 MG tablet Take 81 mg by mouth daily. Swallow whole.     Cholecalciferol (VITAMIN D3) 50 MCG (2000 UT) TABS Take 2,000 Units by mouth daily.     Cyanocobalamin (B-12 PO) Take 1 tablet by mouth daily.     dextromethorphan-guaiFENesin (MUCINEX DM) 30-600 MG 12hr tablet Take 1 tablet by mouth 2 (two) times daily as needed for cough.     docusate sodium (COLACE) 100 MG capsule Take 1 capsule (100 mg total) by mouth daily. 10 capsule 0   Multiple Vitamins-Minerals (ZINC PO) Take 1 tablet by mouth daily.     OXYGEN  Inhale 2 L/min into the lungs continuous.      sodium chloride (OCEAN) 0.65 % SOLN nasal spray Place 2 sprays into both nostrils in the morning, at noon, and at bedtime. 104 mL 3   famotidine (PEPCID) 20 MG tablet TAKE 1 TABLET (20 MG TOTAL) BY MOUTH 2 (TWO) TIMES DAILY. 60 tablet 3   furosemide (LASIX) 20 MG tablet TAKE 1 TABLET (20 MG TOTAL) BY MOUTH DAILY. 90 tablet 1   Glycopyrrolate-Formoterol (BEVESPI AEROSPHERE) 9-4.8 MCG/ACT AERO Inhale 1 puff into the lungs in the morning and at bedtime. 10.7 g 11   potassium chloride SA (KLOR-CON) 20 MEQ tablet TAKE 1 TABLET (20 MEQ TOTAL) BY MOUTH DAILY. 30 tablet 3   rosuvastatin (CRESTOR) 20 MG tablet TAKE 1 TABLET (20 MG TOTAL) BY MOUTH DAILY. 30 tablet 4   No facility-administered medications prior to visit.    Allergies  Allergen Reactions   Erythromycin Other (See Comments)    Allergy from childhood- does not take it    ROS Review of Systems  Constitutional: Negative.   HENT: Negative.  Negative for ear pain, postnasal drip, rhinorrhea, sinus pressure, sore throat, trouble swallowing and voice change.   Eyes: Negative.   Respiratory:  Positive for shortness of breath. Negative for apnea, cough, choking, chest tightness, wheezing and stridor.   Cardiovascular: Negative.  Negative for chest pain, palpitations and leg swelling.  Gastrointestinal: Negative.  Negative for abdominal distention, abdominal pain, nausea and vomiting.  Genitourinary: Negative.   Musculoskeletal: Negative.  Negative for arthralgias and myalgias.  Skin: Negative.  Negative for rash.  Allergic/Immunologic: Negative.  Negative for environmental allergies and food allergies.  Neurological: Negative.  Negative for dizziness, syncope, weakness and headaches.  Hematological: Negative.  Negative for adenopathy. Does not bruise/bleed easily.  Psychiatric/Behavioral: Negative.  Negative for agitation and sleep disturbance. The patient is not nervous/anxious.       Objective:    Physical Exam Vitals reviewed.  Constitutional:      Appearance: Normal appearance. He is well-developed. He is not diaphoretic.  HENT:     Head: Normocephalic and atraumatic.     Nose: No nasal deformity, septal deviation, mucosal edema or rhinorrhea.     Right Sinus: No maxillary sinus tenderness or frontal sinus tenderness.     Left Sinus: No maxillary sinus tenderness or frontal sinus tenderness.     Mouth/Throat:     Pharynx: No oropharyngeal exudate.  Eyes:     General: No scleral icterus.    Conjunctiva/sclera: Conjunctivae normal.     Pupils: Pupils are equal,  round, and reactive to light.  Neck:     Thyroid: No thyromegaly.     Vascular: No carotid bruit or JVD.     Trachea: Trachea normal. No tracheal tenderness or tracheal deviation.  Cardiovascular:     Rate and Rhythm: Normal rate and regular rhythm.     Chest Wall: PMI is not displaced.     Pulses: Normal pulses. No decreased pulses.     Heart sounds: Normal heart sounds, S1 normal and S2 normal. Heart sounds not distant. No murmur heard. No systolic murmur is present.  No diastolic murmur is present.    No friction rub. No gallop. No S3 or S4 sounds.  Pulmonary:     Effort: Pulmonary effort is normal. No tachypnea, accessory muscle usage or respiratory distress.     Breath sounds: No stridor. No decreased breath sounds, wheezing, rhonchi or rales.     Comments: Distant breath sounds Chest:     Chest wall: No tenderness.  Abdominal:     General: Bowel sounds are normal. There is no distension.     Palpations: Abdomen is soft. Abdomen is not rigid.     Tenderness: There is no abdominal tenderness. There is no guarding or rebound.  Musculoskeletal:        General: Normal range of motion.     Cervical back: Normal range of motion and neck supple. No edema, erythema or rigidity. No muscular tenderness. Normal range of motion.  Lymphadenopathy:     Head:     Right side of head: No submental or  submandibular adenopathy.     Left side of head: No submental or submandibular adenopathy.     Cervical: No cervical adenopathy.  Skin:    General: Skin is warm and dry.     Coloration: Skin is not pale.     Findings: No rash.     Nails: There is no clubbing.  Neurological:     Mental Status: He is alert and oriented to person, place, and time.     Sensory: No sensory deficit.  Psychiatric:        Mood and Affect: Mood normal.        Speech: Speech normal.        Behavior: Behavior normal.    BP 111/72   Pulse 86   Resp 16   Wt 144 lb (65.3 kg)   SpO2 98%   BMI 19.53 kg/m  Wt Readings from Last 3 Encounters:  05/02/21 144 lb (65.3 kg)  01/27/21 142 lb 9.6 oz (64.7 kg)  12/05/20 143 lb 9.6 oz (65.1 kg)     Health Maintenance Due  Topic Date Due   COVID-19 Vaccine (3 - Moderna risk series) 06/01/2020   COLON CANCER SCREENING ANNUAL FOBT  08/18/2020    There are no preventive care reminders to display for this patient.  Lab Results  Component Value Date   TSH 1.757 04/25/2019   Lab Results  Component Value Date   WBC 6.2 10/04/2020   HGB 13.1 10/04/2020   HCT 38.4 10/04/2020   MCV 94 10/04/2020   PLT 168 10/04/2020   Lab Results  Component Value Date   NA 144 10/04/2020   K 4.2 10/04/2020   CO2 31 (H) 10/04/2020   GLUCOSE 98 10/04/2020   BUN 10 10/04/2020   CREATININE 0.69 (L) 10/04/2020   BILITOT 0.3 10/04/2020   ALKPHOS 70 10/04/2020   AST 19 10/04/2020   ALT 25 10/04/2020   PROT 6.9 10/04/2020  ALBUMIN 4.3 10/04/2020   CALCIUM 9.0 10/04/2020   ANIONGAP 10 09/07/2020   EGFR 106 10/04/2020   Lab Results  Component Value Date   CHOL 238 (H) 04/06/2020   Lab Results  Component Value Date   HDL 112 04/06/2020   Lab Results  Component Value Date   LDLCALC 114 (H) 04/06/2020   Lab Results  Component Value Date   TRIG 74 04/06/2020   Lab Results  Component Value Date   CHOLHDL 2.1 04/06/2020   Lab Results  Component Value Date    HGBA1C 5.9 (H) 03/08/2020      Assessment & Plan:   Problem List Items Addressed This Visit       Cardiovascular and Mediastinum   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)    Chronic pulmonary hypertension due to obstructive pulmonary disease stable this time continue oxygen therapy inhalers and BiPAP at night      Relevant Medications   furosemide (LASIX) 20 MG tablet   rosuvastatin (CRESTOR) 20 MG tablet   Chronic diastolic heart failure (HCC)    Compensated diastolic heart failure continue furosemide      Relevant Medications   furosemide (LASIX) 20 MG tablet   potassium chloride SA (KLOR-CON) 20 MEQ tablet   rosuvastatin (CRESTOR) 20 MG tablet   Atherosclerosis of aorta (HCC) - Primary    Recheck lipid panel goal of LDL less than 70 continue current dose of Crestor      Relevant Medications   furosemide (LASIX) 20 MG tablet   rosuvastatin (CRESTOR) 20 MG tablet   Other Relevant Orders   Lipid panel     Respiratory   COPD GOLD IV     Continue inhaled medications      Relevant Medications   Glycopyrrolate-Formoterol (BEVESPI AEROSPHERE) 9-4.8 MCG/ACT AERO   Chronic respiratory failure with hypoxia and hypercapnia (HCC)    Continue BiPAP therapy        Other   Former smoker    Congratulated patient on quitting smoking      Hyperlipidemia    As per aortic atherosclerosis assessment      Relevant Medications   furosemide (LASIX) 20 MG tablet   rosuvastatin (CRESTOR) 20 MG tablet   Other Visit Diagnoses     Chronic right shoulder pain       Relevant Orders   Ambulatory referral to Orthopedic Surgery   Colon cancer screening       Relevant Orders   Fecal occult blood, imunochemical   Need for immunization against influenza       Relevant Orders   Flu Vaccine QUAD 16moIM (Fluarix, Fluzone & Alfiuria Quad PF) (Completed)   Need for vaccination for Strep pneumoniae       Need for vaccination against Streptococcus  pneumoniae       Relevant Orders   Pneumococcal conjugate vaccine 20-valent (Prevnar 20) (Completed)       Meds ordered this encounter  Medications   famotidine (PEPCID) 20 MG tablet    Sig: TAKE 1 TABLET (20 MG TOTAL) BY MOUTH 2 (TWO) TIMES DAILY.    Dispense:  60 tablet    Refill:  3   furosemide (LASIX) 20 MG tablet    Sig: TAKE 1 TABLET (20 MG TOTAL) BY MOUTH DAILY.    Dispense:  90 tablet    Refill:  1   Glycopyrrolate-Formoterol (BEVESPI AEROSPHERE) 9-4.8 MCG/ACT AERO    Sig: Inhale 1 puff into the lungs in the morning and at  bedtime.    Dispense:  10.7 g    Refill:  11   potassium chloride SA (KLOR-CON) 20 MEQ tablet    Sig: TAKE 1 TABLET (20 MEQ TOTAL) BY MOUTH DAILY.    Dispense:  30 tablet    Refill:  3   rosuvastatin (CRESTOR) 20 MG tablet    Sig: TAKE 1 TABLET (20 MG TOTAL) BY MOUTH DAILY.    Dispense:  30 tablet    Refill:  4   Colon cancer screening with fecal occult kit Follow-up: Return in about 4 months (around 08/30/2021).    Asencion Noble, MD

## 2021-05-02 NOTE — Assessment & Plan Note (Signed)
Continue BiPAP therapy

## 2021-05-02 NOTE — Assessment & Plan Note (Signed)
Recheck lipid panel goal of LDL less than 70 continue current dose of Crestor

## 2021-05-02 NOTE — Patient Instructions (Signed)
Prevnar 20 pneumonia vaccine was given and flu vaccine was given  You had completed your shingles vaccine series in June of this year  Referral to orthopedic surgery will be made Dr. Marlou Sa for your shoulder  Refills on all your medications sent to our pharmacy  Labs today include lipid panel  Pick up a colon cancer screening kit from the lab today and process this turned back into the lab  Return to see Dr. [name] in 4 months

## 2021-05-02 NOTE — Assessment & Plan Note (Signed)
Continue inhaled medications

## 2021-05-02 NOTE — Assessment & Plan Note (Signed)
Congratulated patient on quitting smoking

## 2021-05-02 NOTE — Assessment & Plan Note (Signed)
Compensated diastolic heart failure continue furosemide

## 2021-05-03 ENCOUNTER — Other Ambulatory Visit: Payer: Self-pay | Admitting: Critical Care Medicine

## 2021-05-03 ENCOUNTER — Telehealth: Payer: Self-pay | Admitting: Critical Care Medicine

## 2021-05-03 ENCOUNTER — Other Ambulatory Visit: Payer: Self-pay

## 2021-05-03 LAB — LIPID PANEL
Chol/HDL Ratio: 2.6 ratio (ref 0.0–5.0)
Cholesterol, Total: 192 mg/dL (ref 100–199)
HDL: 73 mg/dL (ref >39)
LDL Chol Calc (NIH): 108 mg/dL — ABNORMAL HIGH (ref 0–99)
Triglycerides: 61 mg/dL (ref 0–149)
VLDL Cholesterol Cal: 11 mg/dL (ref 5–40)

## 2021-05-03 MED ORDER — ROSUVASTATIN CALCIUM 40 MG PO TABS
40.0000 mg | ORAL_TABLET | Freq: Every day | ORAL | 4 refills | Status: DC
  Filled 2021-05-03 – 2021-07-03 (×2): qty 60, 60d supply, fill #0

## 2021-05-03 NOTE — Telephone Encounter (Signed)
-----  Message from Elsie Stain, MD sent at 05/03/2021  6:00 AM EDT ----- Let pt know cholesterol is not a goal.  I sent an increase dose of rosuvastatin of 70m to his pharmacy.  He can use up the current bottle by taking two of the 231mtablets daily

## 2021-05-03 NOTE — Telephone Encounter (Signed)
Pt was called and vm was left. Information was sent to nurse pool.

## 2021-05-04 ENCOUNTER — Other Ambulatory Visit: Payer: Self-pay

## 2021-05-10 ENCOUNTER — Other Ambulatory Visit: Payer: Self-pay

## 2021-05-11 ENCOUNTER — Other Ambulatory Visit: Payer: Self-pay

## 2021-05-29 ENCOUNTER — Ambulatory Visit: Payer: Medicaid Other | Admitting: Orthopedic Surgery

## 2021-06-01 ENCOUNTER — Ambulatory Visit: Payer: Self-pay | Admitting: *Deleted

## 2021-06-01 DIAGNOSIS — J441 Chronic obstructive pulmonary disease with (acute) exacerbation: Secondary | ICD-10-CM | POA: Diagnosis not present

## 2021-06-01 NOTE — Telephone Encounter (Signed)
Patient's wife , DPR, called to report patient noted coughing up green sputum today . Hx COPD. Patient on O 2 at 2L/min . Denies chest pain , difficulty breathing no shortness of breath. No fever. Last OV patient received flu/ pnm shot together. Patient has been taking  mucinex DM for symptoms. Requesting medication for symptoms. No available appt until 07/18/21. Patient and wife do not feel patient symptoms bad enough to go to [facility] or ED. Please advise. Patient's wife verbalized understanding of care advise and to call back or go to [facility] or ED if symptoms worsen.

## 2021-06-01 NOTE — Telephone Encounter (Signed)
Reason for Disposition  [1] Known COPD or other severe lung disease (i.e., bronchiectasis, cystic fibrosis, lung surgery) AND [2] worsening symptoms (i.e., increased sputum purulence or amount, increased breathing difficulty  Answer Assessment - Initial Assessment Questions 1. ONSET: "When did the cough begin?"      Sunday 05/28/21 2. SEVERITY: "How bad is the cough today?"      Not bad taking mucinex DM 3. SPUTUM: "Describe the color of your sputum" (none, dry cough; clear, white, yellow, green)     Green  4. HEMOPTYSIS: "Are you coughing up any blood?" If so ask: "How much?" (flecks, streaks, tablespoons, etc.)     No  5. DIFFICULTY BREATHING: "Are you having difficulty breathing?" If Yes, ask: "How bad is it?" (e.g., mild, moderate, severe)    - MILD: No SOB at rest, mild SOB with walking, speaks normally in sentences, can lie down, no retractions, pulse < 100.    - MODERATE: SOB at rest, SOB with minimal exertion and prefers to sit, cannot lie down flat, speaks in phrases, mild retractions, audible wheezing, pulse 100-120.    - SEVERE: Very SOB at rest, speaks in single words, struggling to breathe, sitting hunched forward, retractions, pulse > 120      No  6. FEVER: "Do you have a fever?" If Yes, ask: "What is your temperature, how was it measured, and when did it start?"     no 7. CARDIAC HISTORY: "Do you have any history of heart disease?" (e.g., heart attack, congestive heart failure)      na 8. LUNG HISTORY: "Do you have any history of lung disease?"  (e.g., pulmonary embolus, asthma, emphysema)     Hx COPD  9. PE RISK FACTORS: "Do you have a history of blood clots?" (or: recent major surgery, recent prolonged travel, bedridden)     na 10. OTHER SYMPTOMS: "Do you have any other symptoms?" (e.g., runny nose, wheezing, chest pain)       No  11. PREGNANCY: "Is there any chance you are pregnant?" "When was your last menstrual period?"       na 12. TRAVEL: "Have you traveled out of  the country in the last month?" (e.g., travel history, exposures)       na  Protocols used: Cough - Acute Productive-A-AH

## 2021-06-12 ENCOUNTER — Ambulatory Visit: Payer: Medicaid Other | Admitting: Orthopedic Surgery

## 2021-07-02 DIAGNOSIS — J441 Chronic obstructive pulmonary disease with (acute) exacerbation: Secondary | ICD-10-CM | POA: Diagnosis not present

## 2021-07-03 ENCOUNTER — Other Ambulatory Visit: Payer: Self-pay | Admitting: Critical Care Medicine

## 2021-07-03 ENCOUNTER — Other Ambulatory Visit: Payer: Self-pay

## 2021-07-03 DIAGNOSIS — J449 Chronic obstructive pulmonary disease, unspecified: Secondary | ICD-10-CM

## 2021-07-03 MED ORDER — DM-GUAIFENESIN ER 30-600 MG PO TB12
1.0000 | ORAL_TABLET | Freq: Two times a day (BID) | ORAL | 4 refills | Status: DC | PRN
  Filled 2021-07-03: qty 60, 30d supply, fill #0

## 2021-07-03 MED ORDER — ALBUTEROL SULFATE HFA 108 (90 BASE) MCG/ACT IN AERS
2.0000 | INHALATION_SPRAY | Freq: Four times a day (QID) | RESPIRATORY_TRACT | 2 refills | Status: DC | PRN
  Filled 2021-07-03: qty 8.5, 25d supply, fill #0

## 2021-07-03 MED ORDER — ALBUTEROL SULFATE HFA 108 (90 BASE) MCG/ACT IN AERS
2.0000 | INHALATION_SPRAY | Freq: Four times a day (QID) | RESPIRATORY_TRACT | 2 refills | Status: DC | PRN
  Filled 2021-07-03: qty 18, 25d supply, fill #0

## 2021-07-03 MED ORDER — ASPIRIN EC 81 MG PO TBEC
81.0000 mg | DELAYED_RELEASE_TABLET | Freq: Every day | ORAL | 4 refills | Status: DC
  Filled 2021-07-03: qty 80, 80d supply, fill #0

## 2021-07-08 ENCOUNTER — Other Ambulatory Visit: Payer: Self-pay

## 2021-07-10 ENCOUNTER — Other Ambulatory Visit: Payer: Self-pay

## 2021-07-11 ENCOUNTER — Other Ambulatory Visit: Payer: Self-pay

## 2021-07-31 NOTE — Progress Notes (Incomplete)
Cardiology Office Note   Date:  08/01/2021   ID:  Anthony Nelson, DOB 1960/09/23, MRN [id number]  PCP:  Elsie Stain, MD  Cardiologist:   Orion Crook   No chief complaint on file.   History of Present Illness: Anthony Nelson is a 61 y.o. male with COPD, severe pulmonary hypertension (WHO group 3), aortic atherosclerosis, and prior tobacco abuse here for follow-up.  He was initially seen in 2019 for the evaluation of pulmonary hypertension.  [name] was admitted 01/2018 with a COPD exacerbation. This was a new diagnosis for him.  [name] has a longstanding history of tobacco abuse.  When he came to the ED he had progressive dyspnea.  He also had lower extremity edema but no orthopnea or PND.  He was hypoxic to 73% on room air and improved to >98% on 2L.  Chest CT was negative for PE but suggested elevated R heart disease.  It also showed moderate emphysema.  Echo that admission revealed LVEF 60-65% with grade 1 diastolic dysfunction, moderately dilated RV with moderate-severely reduced systolic function.  PASP was 80 mmHg.  Labs were notable for mildly elevated LFTs.  BNP was 825.  He was treated with IV steroids and inhalers.  He was also diuresed but continued to have an oxygen requirement at discharge.  Since that time he has been able to come off oxygen and maintains sats >88% at rest and with exertion.  He was to follow up with cardioloy in 2 months and hasn't been seen.  [name] was admitted 03/2020 with hypoxic respiratory failure.  EMS found him with an oxygen saturation of 45% on room air.  He was placed on a nonrebreather and then BiPAP.  He was admitted to the ICU and was able to be titrated back down to 3 L nasal cannula.  Echo during that hospitalization revealed LVEF 60 to 65% with normal diastolic function.  He had severe right ventricular failure.  PASP was 45 mmHg. At his last visit, he was doing better and quit smoking. He was referred to pulmonary rehab. Since being  discharged he has been doing much better.  He feels like his breathing is back to baseline and he is gaining weight.  He stopped smoking by going cold [place].  He still struggles with wearing the BiPAP.  The first time he wore it is when he woke up in the ICU and this was very traumatic for him.  He is trying to wear it for longer and longer periods of time at home.  He has had very mild edema denies any orthopnea or PND.  He has no chest pain or pressure.  He has been trying to walk for about 10 minutes at a time.  Today,  He denies any palpitations, chest pain, or shortness of breath, lightheadedness, headaches, syncope, orthopnea, PND, lower extremity edema or exertional symptoms.  Past Medical History:  Diagnosis Date   Atherosclerosis of aorta ([place]) 04/06/2020   Bronchitis    COPD (chronic obstructive pulmonary disease) (HCC)    Diastolic heart failure (HCC)    GERD (gastroesophageal reflux disease)    Pulmonary hypertension ([place])    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 ([place]) 04/10/2018   Respiratory failure with hypoxia (HCC)     Past Surgical History:  Procedure Laterality Date   HERNIA REPAIR       Current Outpatient Medications  Medication Sig Dispense Refill   albuterol (PROVENTIL) (2.5 MG/3ML) 0.083% nebulizer solution  TAKE 3 MLS (2.5 MG TOTAL) BY NEBULIZATION EVERY 6 (SIX) HOURS AS NEEDED FOR WHEEZING OR SHORTNESS OF BREATH. 75 mL 2   albuterol (VENTOLIN HFA) 108 (90 Base) MCG/ACT inhaler Inhale 2 puffs into the lungs every 6 (six) hours as needed for wheezing or shortness of breath. 18 g 2   Ascorbic Acid (VITAMIN C) 100 MG tablet Take 100 mg by mouth daily.     aspirin EC 81 MG tablet Take 1 tablet (81 mg total) by mouth daily. Swallow whole. 80 tablet 4   Cholecalciferol (VITAMIN D3) 50 MCG (2000 UT) TABS Take 2,000 Units by mouth daily.     Cyanocobalamin (B-12 PO) Take 1 tablet by mouth daily.     dextromethorphan-guaiFENesin (MUCINEX DM)  30-600 MG 12hr tablet Take 1 tablet by mouth 2 (two) times daily as needed for cough. 60 tablet 4   docusate sodium (COLACE) 100 MG capsule Take 1 capsule (100 mg total) by mouth daily. 10 capsule 0   famotidine (PEPCID) 20 MG tablet TAKE 1 TABLET (20 MG TOTAL) BY MOUTH 2 (TWO) TIMES DAILY. 60 tablet 3   furosemide (LASIX) 20 MG tablet TAKE 1 TABLET (20 MG TOTAL) BY MOUTH DAILY. 90 tablet 1   Glycopyrrolate-Formoterol (BEVESPI AEROSPHERE) 9-4.8 MCG/ACT AERO Inhale 1 puff into the lungs in the morning and at bedtime. 10.7 g 11   Multiple Vitamins-Minerals (ZINC PO) Take 1 tablet by mouth daily.     OXYGEN Inhale 2 L/min into the lungs continuous.      potassium chloride SA (KLOR-CON) 20 MEQ tablet TAKE 1 TABLET (20 MEQ TOTAL) BY MOUTH DAILY. 30 tablet 3   rosuvastatin (CRESTOR) 40 MG tablet Take 1 tablet (40 mg total) by mouth daily. 60 tablet 4   sodium chloride (OCEAN) 0.65 % SOLN nasal spray Place 2 sprays into both nostrils in the morning, at noon, and at bedtime. 104 mL 3   No current facility-administered medications for this visit.    Allergies:   Erythromycin    Social History:  The patient  reports that he quit smoking about a year ago. His smoking use included cigarettes. He has a 4.40 pack-year smoking history. He has never used smokeless tobacco. He reports that he does not currently use alcohol. He reports that he does not use drugs.   Family History:  The patient's family history includes CAD in his father; Cancer in his father and mother; Diabetes in his maternal grandmother; Heart attack in his paternal grandfather; Heart failure in his father; Kidney cancer in his paternal grandmother; Kidney disease in his maternal grandmother.    ROS:  Please see the history of present illness.  All other systems are reviewed and negative.   PHYSICAL EXAM: VS:  There were no vitals taken for this visit. , BMI There is no height or weight on file to calculate BMI. GENERAL:  Well  appearing HEENT:  Pupils equal round and reactive, fundi not visualized, oral mucosa unremarkable NECK:  No jugular venous distention, waveform within normal limits, carotid upstroke brisk and symmetric, no bruits  LUNGS:  Clear to auscultation bilaterally HEART:  RRR.  PMI not displaced or sustained,S1 and S2 within normal limits, no S3, no S4, no clicks, no rubs, no  murmurs ABD:  Flat, positive bowel sounds normal in frequency in pitch, no bruits, no rebound, no guarding, no midline pulsatile mass, no hepatomegaly, no splenomegaly EXT:  2 plus pulses throughout, no edema, no cyanosis no clubbing SKIN:  No rashes no  nodules NEURO:  Cranial nerves II through XII grossly intact, motor grossly intact throughout PSYCH:  Cognitively intact, oriented to person place and time   EKG:   08/02/21: Sinus ***, rate *** bpm 04/02/18: sinus rhythm.  Rate 99 bpm.  Right atrial enlargement.  Anterior T wave and normalities.  Echo 03/09/2020: IMPRESSIONS   1. Severe RV failure.   2. Left ventricular ejection fraction, by estimation, is 60 to 65%. The  left ventricle has normal function. The left ventricle has no regional  wall motion abnormalities. Left ventricular diastolic parameters were  normal.   3. Right ventricular systolic function is severely reduced. The right  ventricular size is severely enlarged. There is moderately elevated  pulmonary artery systolic pressure.   4. The mitral valve is normal in structure. Trivial mitral valve  regurgitation. No evidence of mitral stenosis.   5. Tricuspid valve regurgitation is mild to moderate.   6. The aortic valve is tricuspid. Aortic valve regurgitation is not  visualized. Mild aortic valve sclerosis is present, with no evidence of  aortic valve stenosis.   7. The inferior vena cava is dilated in size with <50% respiratory  variability, suggesting right atrial pressure of 15 mmHg.   Recent Labs: 09/05/2020: Magnesium 2.0 10/04/2020: ALT 25; BUN 10;  Creatinine, Ser 0.69; Hemoglobin 13.1; Platelets 168; Potassium 4.2; Sodium 144    Lipid Panel    Component Value Date/Time   CHOL 192 05/02/2021 1649   TRIG 61 05/02/2021 1649   HDL 73 05/02/2021 1649   CHOLHDL 2.6 05/02/2021 1649   LDLCALC 108 (H) 05/02/2021 1649      Wt Readings from Last 3 Encounters:  05/02/21 144 lb (65.3 kg)  01/27/21 142 lb 9.6 oz (64.7 kg)  12/05/20 143 lb 9.6 oz (65.1 kg)      ASSESSMENT AND PLAN: No problem-specific Assessment & Plan notes found for this encounter.  # Severe pulmonary hypertension, WHO class 3: # Tobacco abuse: PASP was 80 mmHg in 2019 and 45 mmHg 03/2020.  This likely represents progressive RV failure rather than improvement.  He was congratulated on smoking cessation.  We discussed the importance of using his BiPAP even though it is difficult to tolerate.  He is interested in increasing his exercise. We will look into pulmonary rehab referral.  His volume status today is pretty good.  Discussed limiting fluids to 2L and sodium to 1574m. He struggles with ED, but he understands that he is not a good candidate for PDE-5 inhibitors. Pulm rehab  # Aorta atherosclerosis: Asymptomatic.  He will check fasting lipids/CMP.  LDL goal <70.  # ED: Not a candidate for PDE-5 inhibitors.  He will follow up with his PCP/urology.  Current medicines are reviewed at length with the patient today.  The patient does not have concerns regarding medicines.  The following changes have been made:  no change  Labs/ tests ordered today include:   No orders of the defined types were placed in this encounter.    Disposition:   FU with Tiffany C. ROval Linsey MD, FKind[facility] Hospital Town [facility] Countryin ***  [place] a scribe for TSkeet Latch MD.,have documented all relevant documentation on the behalf of TSkeet Latch MD,as directed by  TSkeet Latch MD while in the presence of TSkeet Latch MD.  ***  Signed, Tiffany C. ROval Linsey MD, FAmbulatory Surgery Center Of [facility]  08/01/2021 1:15 PM    [facility]

## 2021-08-02 ENCOUNTER — Ambulatory Visit (HOSPITAL_BASED_OUTPATIENT_CLINIC_OR_DEPARTMENT_OTHER): Payer: Medicaid Other | Admitting: Cardiovascular Disease

## 2021-08-02 DIAGNOSIS — J441 Chronic obstructive pulmonary disease with (acute) exacerbation: Secondary | ICD-10-CM | POA: Diagnosis not present

## 2021-08-28 ENCOUNTER — Ambulatory Visit (HOSPITAL_BASED_OUTPATIENT_CLINIC_OR_DEPARTMENT_OTHER): Payer: Medicaid Other | Admitting: Cardiovascular Disease

## 2021-09-04 ENCOUNTER — Ambulatory Visit: Payer: Medicaid Other | Admitting: Critical Care Medicine

## 2021-09-28 ENCOUNTER — Other Ambulatory Visit: Payer: Self-pay

## 2021-09-28 ENCOUNTER — Ambulatory Visit: Payer: Medicaid Other | Attending: Critical Care Medicine | Admitting: Critical Care Medicine

## 2021-09-28 ENCOUNTER — Telehealth: Payer: Self-pay

## 2021-09-28 ENCOUNTER — Encounter: Payer: Self-pay | Admitting: Critical Care Medicine

## 2021-09-28 VITALS — BP 105/74 | HR 96 | Temp 98.3°F | Ht 72.0 in | Wt 134.4 lb

## 2021-09-28 DIAGNOSIS — Z5901 Sheltered homelessness: Secondary | ICD-10-CM | POA: Diagnosis not present

## 2021-09-28 DIAGNOSIS — J449 Chronic obstructive pulmonary disease, unspecified: Secondary | ICD-10-CM | POA: Diagnosis not present

## 2021-09-28 DIAGNOSIS — J9612 Chronic respiratory failure with hypercapnia: Secondary | ICD-10-CM | POA: Diagnosis not present

## 2021-09-28 DIAGNOSIS — D7589 Other specified diseases of blood and blood-forming organs: Secondary | ICD-10-CM | POA: Diagnosis not present

## 2021-09-28 DIAGNOSIS — I5032 Chronic diastolic (congestive) heart failure: Secondary | ICD-10-CM

## 2021-09-28 DIAGNOSIS — J9611 Chronic respiratory failure with hypoxia: Secondary | ICD-10-CM | POA: Diagnosis not present

## 2021-09-28 DIAGNOSIS — Z1211 Encounter for screening for malignant neoplasm of colon: Secondary | ICD-10-CM | POA: Diagnosis not present

## 2021-09-28 DIAGNOSIS — E782 Mixed hyperlipidemia: Secondary | ICD-10-CM | POA: Diagnosis not present

## 2021-09-28 DIAGNOSIS — I2723 Pulmonary hypertension due to lung diseases and hypoxia: Secondary | ICD-10-CM

## 2021-09-28 DIAGNOSIS — R634 Abnormal weight loss: Secondary | ICD-10-CM

## 2021-09-28 MED ORDER — ALBUTEROL SULFATE (2.5 MG/3ML) 0.083% IN NEBU
3.0000 mL | INHALATION_SOLUTION | Freq: Four times a day (QID) | RESPIRATORY_TRACT | 2 refills | Status: DC | PRN
Start: 2021-09-28 — End: 2022-06-05
  Filled 2021-09-28: qty 75, 7d supply, fill #0

## 2021-09-28 MED ORDER — FUROSEMIDE 20 MG PO TABS
20.0000 mg | ORAL_TABLET | Freq: Every day | ORAL | 2 refills | Status: DC | PRN
  Filled 2021-09-28: qty 30, 30d supply, fill #0

## 2021-09-28 MED ORDER — BEVESPI AEROSPHERE 9-4.8 MCG/ACT IN AERO
INHALATION_SPRAY | Freq: Two times a day (BID) | RESPIRATORY_TRACT | 6 refills | Status: DC
  Filled 2021-09-28: qty 10.7, 30d supply, fill #0
  Filled 2021-12-25: qty 10.7, 30d supply, fill #1
  Filled 2022-03-13: qty 10.7, 30d supply, fill #2
  Filled 2022-05-14: qty 10.7, 30d supply, fill #3

## 2021-09-28 MED ORDER — DM-GUAIFENESIN ER 30-600 MG PO TB12
1.0000 | ORAL_TABLET | Freq: Two times a day (BID) | ORAL | 4 refills | Status: DC | PRN
  Filled 2021-09-28: qty 60, 30d supply, fill #0

## 2021-09-28 MED ORDER — ROSUVASTATIN CALCIUM 40 MG PO TABS
40.0000 mg | ORAL_TABLET | Freq: Every day | ORAL | 4 refills | Status: DC
  Filled 2021-09-28: qty 30, 30d supply, fill #0

## 2021-09-28 MED ORDER — ASPIRIN EC 81 MG PO TBEC
81.0000 mg | DELAYED_RELEASE_TABLET | Freq: Every day | ORAL | 4 refills | Status: DC
  Filled 2021-09-28: qty 80, 80d supply, fill #0

## 2021-09-28 MED ORDER — DOCUSATE SODIUM 100 MG PO CAPS
100.0000 mg | ORAL_CAPSULE | Freq: Every day | ORAL | 0 refills | Status: DC
  Filled 2021-09-28: qty 10, 10d supply, fill #0

## 2021-09-28 MED ORDER — POTASSIUM CHLORIDE CRYS ER 20 MEQ PO TBCR
20.0000 meq | EXTENDED_RELEASE_TABLET | Freq: Every day | ORAL | 3 refills | Status: DC | PRN
  Filled 2021-09-28: qty 30, 30d supply, fill #0

## 2021-09-28 MED ORDER — VITAMIN D3 50 MCG (2000 UT) PO TABS
2000.0000 [IU] | ORAL_TABLET | Freq: Every day | ORAL | 3 refills | Status: DC
  Filled 2021-09-28: qty 30, fill #0

## 2021-09-28 MED ORDER — FAMOTIDINE 20 MG PO TABS
ORAL_TABLET | Freq: Two times a day (BID) | ORAL | 3 refills | Status: DC
  Filled 2021-09-28: qty 60, 30d supply, fill #0

## 2021-09-28 MED ORDER — ALBUTEROL SULFATE HFA 108 (90 BASE) MCG/ACT IN AERS
2.0000 | INHALATION_SPRAY | Freq: Four times a day (QID) | RESPIRATORY_TRACT | 2 refills | Status: DC | PRN
Start: 2021-09-28 — End: 2022-01-29
  Filled 2021-09-28: qty 18, 25d supply, fill #0
  Filled 2021-12-25: qty 18, 25d supply, fill #1

## 2021-09-28 MED ORDER — VITAMIN C 100 MG PO TABS
100.0000 mg | ORAL_TABLET | Freq: Every day | ORAL | 2 refills | Status: DC
  Filled 2021-09-28: qty 60, 60d supply, fill #0

## 2021-09-28 NOTE — Assessment & Plan Note (Signed)
Continue BiPAP device at night with 2 L oxygen ?

## 2021-09-28 NOTE — Progress Notes (Signed)
? ?Established Patient Office Visit ? ?Subjective:  ?Patient ID: Anthony Nelson, male    DOB: 1960/12/17  Age: 61 y.o. MRN: [id number] ? ?CC: Primary care follow-up ? ? ?HPI ?05/02/21 ?Anthony Nelson presents for primary care visit.  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  He just saw cardiology in July.  He is on chronic 2 L of oxygen home oxygen and is on low-dose furosemide and BiPAP at night.  He also maintains his inhalers and is no longer smoking cigarettes for a year now. ? ?Patient's biggest complaint is that of chronic shoulder pain on the right.  He played sports when he was younger.  He is uncertain whether he tore her rotator cuff or not but is been an issue for about 2 years.  He is not able to abduct the shoulder above the plane of the collarbone.  He has pain anteriorly in the shoulder as well.  He uses over-the-counter medications for this. ? ?Patient needs a repeat lipid panel has not had one since October 2021 goal is LDL less than 70 ? ?Patient agrees to and did receive both the Prevnar 20 vaccine and flu shot ? ?09/28/21 ?Patient seen today for primary care follow-up.  He has severe COPD Gold stage IV and is on BiPAP at night with 2 L oxygen he takes 2 L during the day as well.  He does need a new nebulizer machine.  Unfortunately the patient has lost his housing.  He now lives in a motel. ? ?Patient states his breathing is at baseline. ?Patient states his shoulder is no longer hurting. ?Mental health is stable. ?Past Medical History:  ?Diagnosis Date  ? Atherosclerosis of aorta ([place]) 04/06/2020  ? Bronchitis   ? COPD (chronic obstructive pulmonary disease) ([place])   ? Diastolic heart failure ([place])   ? GERD (gastroesophageal reflux disease)   ? Pulmonary hypertension ([place])   ? 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 ([place]) 04/10/2018  ? Respiratory failure with hypoxia ([place])   ? ? ?Past Surgical History:   ?Procedure Laterality Date  ? HERNIA REPAIR    ? ? ?Family History  ?Problem Relation Age of Onset  ? Cancer Mother   ? Cancer Father   ? CAD Father   ? Heart failure Father   ? Diabetes Maternal Grandmother   ? Kidney disease Maternal Grandmother   ? Kidney cancer Paternal Grandmother   ? Heart attack Paternal Grandfather   ? ? ?Social History  ? ?Socioeconomic History  ? Marital status: Married  ?  Spouse name: Anthony Nelson  ? Number of children: 3  ? Years of education: 54  ? Highest education level: Bachelor's degree (e.g., BA, AB, BS)  ?Occupational History  ? Occupation: Presenter, broadcasting  ?Tobacco Use  ? Smoking status: Former  ?  Packs/day: 0.10  ?  Years: 44.00  ?  Pack years: 4.40  ?  Types: Cigarettes  ?  Quit date: 07/20/2020  ?  Years since quitting: 1.1  ? Smokeless tobacco: Never  ?Vaping Use  ? Vaping Use: Never used  ?Substance and Sexual Activity  ? Alcohol use: Not Currently  ?  Comment: Socially   ? Drug use: No  ? Sexual activity: Not Currently  ?Other Topics Concern  ? Not on file  ?Social History Narrative  ? Not on file  ? ?Social Determinants of Health  ? ?Financial Resource Strain: Not on file  ?  Food Insecurity: Not on file  ?Transportation Needs: Not on file  ?Physical Activity: Not on file  ?Stress: Not on file  ?Social Connections: Not on file  ?Intimate Partner Violence: Not on file  ? ? ?Outpatient Medications Prior to Visit  ?Medication Sig Dispense Refill  ? Cyanocobalamin (B-12 PO) Take 1 tablet by mouth daily.    ? Multiple Vitamins-Minerals (ZINC PO) Take 1 tablet by mouth daily.    ? OXYGEN Inhale 2 L/min into the lungs continuous.     ? sodium chloride (OCEAN) 0.65 % SOLN nasal spray Place 2 sprays into both nostrils in the morning, at noon, and at bedtime. 104 mL 3  ? albuterol (PROVENTIL) (2.5 MG/3ML) 0.083% nebulizer solution TAKE 3 MLS (2.5 MG TOTAL) BY NEBULIZATION EVERY 6 (SIX) HOURS AS NEEDED FOR WHEEZING OR SHORTNESS OF BREATH. 75 mL 2  ? albuterol (VENTOLIN HFA) 108  (90 Base) MCG/ACT inhaler Inhale 2 puffs into the lungs every 6 (six) hours as needed for wheezing or shortness of breath. 18 g 2  ? Ascorbic Acid (VITAMIN C) 100 MG tablet Take 100 mg by mouth daily.    ? aspirin EC 81 MG tablet Take 1 tablet (81 mg total) by mouth daily. Swallow whole. 80 tablet 4  ? Cholecalciferol (VITAMIN D3) 50 MCG (2000 UT) TABS Take 2,000 Units by mouth daily.    ? dextromethorphan-guaiFENesin (MUCINEX DM) 30-600 MG 12hr tablet Take 1 tablet by mouth 2 (two) times daily as needed for cough. 60 tablet 4  ? docusate sodium (COLACE) 100 MG capsule Take 1 capsule (100 mg total) by mouth daily. 10 capsule 0  ? famotidine (PEPCID) 20 MG tablet TAKE 1 TABLET (20 MG TOTAL) BY MOUTH 2 (TWO) TIMES DAILY. 60 tablet 3  ? furosemide (LASIX) 20 MG tablet TAKE 1 TABLET (20 MG TOTAL) BY MOUTH DAILY. (Patient taking differently: Take 20 mg by mouth daily as needed.) 90 tablet 1  ? Glycopyrrolate-Formoterol (BEVESPI AEROSPHERE) 9-4.8 MCG/ACT AERO Inhale 1 puff into the lungs in the morning and at bedtime. 10.7 g 11  ? potassium chloride SA (KLOR-CON) 20 MEQ tablet TAKE 1 TABLET (20 MEQ TOTAL) BY MOUTH DAILY. 30 tablet 3  ? rosuvastatin (CRESTOR) 40 MG tablet Take 1 tablet (40 mg total) by mouth daily. 60 tablet 4  ? ?No facility-administered medications prior to visit.  ? ? ?Allergies  ?Allergen Reactions  ? Erythromycin Other (See Comments)  ?  Allergy from childhood- does not take it  ? ? ?ROS ?Review of Systems  ?Constitutional: Negative.   ?HENT: Negative.  Negative for ear pain, postnasal drip, rhinorrhea, sinus pressure, sore throat, trouble swallowing and voice change.   ?Eyes: Negative.   ?Respiratory:  Positive for shortness of breath. Negative for apnea, cough, choking, chest tightness, wheezing and stridor.   ?Cardiovascular: Negative.  Negative for chest pain, palpitations and leg swelling.  ?Gastrointestinal: Negative.  Negative for abdominal distention, abdominal pain, nausea and vomiting.   ?Genitourinary: Negative.   ?Musculoskeletal: Negative.  Negative for arthralgias and myalgias.  ?Skin: Negative.  Negative for rash.  ?Allergic/Immunologic: Negative.  Negative for environmental allergies and food allergies.  ?Neurological: Negative.  Negative for dizziness, syncope, weakness and headaches.  ?Hematological: Negative.  Negative for adenopathy. Does not bruise/bleed easily.  ?Psychiatric/Behavioral: Negative.  Negative for agitation and sleep disturbance. The patient is not nervous/anxious.   ? ?  ?Objective:  ?  ?Physical Exam ?Vitals reviewed.  ?Constitutional:   ?   Appearance: Normal appearance. He is  well-developed. He is not diaphoretic.  ?   Comments: Thin  ?HENT:  ?   Head: Normocephalic and atraumatic.  ?   Nose: No nasal deformity, septal deviation, mucosal edema or rhinorrhea.  ?   Right Sinus: No maxillary sinus tenderness or frontal sinus tenderness.  ?   Left Sinus: No maxillary sinus tenderness or frontal sinus tenderness.  ?   Mouth/Throat:  ?   Pharynx: No oropharyngeal exudate.  ?Eyes:  ?   General: No scleral icterus. ?   Conjunctiva/sclera: Conjunctivae normal.  ?   Pupils: Pupils are equal, round, and reactive to light.  ?Neck:  ?   Thyroid: No thyromegaly.  ?   Vascular: No carotid bruit or JVD.  ?   Trachea: Trachea normal. No tracheal tenderness or tracheal deviation.  ?Cardiovascular:  ?   Rate and Rhythm: Normal rate and regular rhythm.  ?   Chest Wall: PMI is not displaced.  ?   Pulses: Normal pulses. No decreased pulses.  ?   Heart sounds: Normal heart sounds, S1 normal and S2 normal. Heart sounds not distant. No murmur heard. ?No systolic murmur is present.  ?No diastolic murmur is present.  ?  No friction rub. No gallop. No S3 or S4 sounds.  ?Pulmonary:  ?   Effort: Pulmonary effort is normal. No tachypnea, accessory muscle usage or respiratory distress.  ?   Breath sounds: No stridor. No decreased breath sounds, wheezing, rhonchi or rales.  ?   Comments: Distant breath  sounds ?Chest:  ?   Chest wall: No tenderness.  ?Abdominal:  ?   General: Bowel sounds are normal. There is no distension.  ?   Palpations: Abdomen is soft. Abdomen is not rigid.  ?   Tenderness: There is n

## 2021-09-28 NOTE — Assessment & Plan Note (Signed)
Follow-up lipid panel and continue Crestor ?

## 2021-09-28 NOTE — Telephone Encounter (Signed)
Met with the patient when he was in the clinic today. He is currently staying at the [facility] but is looking for permanent housing. He has been to the [facility] for assistance but did not feel that he was provided with information that addressed his needs.  ? ?I provided him with the phone numbers for [facility] and encouraged him to call both agencies and noted that The [facility] provides housing for veterans.  He said he does not have his DD-214 but thinks he has a picture of it.  ? ?I also provided him with the phone numbers for [employer] and [employer] [employer] [facility]  as well as the number to schedule transportation to medical appointments through [employer] . ? ?He said he has already put his name on the wait list for section 8 housing with [employer].  ? ?The patient said that he would like to spend up to $650/month for housing. I provided him with a current listing of available apartments from [email or web address].  ? ?A nebulizer has been ordered and he is in agreement to sending the order to [place], the DME company that provides his O2.  The order was then faxed to [facility] as requested.  ?

## 2021-09-28 NOTE — Assessment & Plan Note (Signed)
Follow-up CBC ?

## 2021-09-28 NOTE — Patient Instructions (Signed)
Our nurse case manager met with you today regarding your housing ? ?Please process the colon cancer screening kit return it for colon cancer screening ? ?Complete set of labs obtained today ? ?Refills on all medications sent to the pharmacy downstairs ? ?New nebulizer [facility] ? ?Return to see Dr. [name] 4 months ?

## 2021-09-28 NOTE — Assessment & Plan Note (Signed)
Patient lost housing as he was staying at his mother's house when she died their mortgage cannot be paid and he lost his housing ? ?I connected the patient to clinical nurse case manager at this visit ?

## 2021-09-28 NOTE — Assessment & Plan Note (Signed)
BMI down to 18 I have recommended for the patient to increase his nutritional status by taking Ensure supplements ?

## 2021-09-28 NOTE — Assessment & Plan Note (Signed)
Have refilled inhaled medications and also will obtain for the patient a new nebulizing machine maintain oxygen as prescribed ?

## 2021-09-29 ENCOUNTER — Telehealth: Payer: Self-pay

## 2021-09-29 ENCOUNTER — Other Ambulatory Visit: Payer: Self-pay

## 2021-09-29 DIAGNOSIS — J449 Chronic obstructive pulmonary disease, unspecified: Secondary | ICD-10-CM | POA: Diagnosis not present

## 2021-09-29 DIAGNOSIS — J439 Emphysema, unspecified: Secondary | ICD-10-CM | POA: Diagnosis not present

## 2021-09-29 LAB — CBC WITH DIFFERENTIAL/PLATELET
Basophils Absolute: 0 10*3/uL (ref 0.0–0.2)
Basos: 1 %
EOS (ABSOLUTE): 0.3 10*3/uL (ref 0.0–0.4)
Eos: 4 %
Hematocrit: 42.8 % (ref 37.5–51.0)
Hemoglobin: 13.6 g/dL (ref 13.0–17.7)
Immature Grans (Abs): 0 10*3/uL (ref 0.0–0.1)
Immature Granulocytes: 0 %
Lymphocytes Absolute: 1.3 10*3/uL (ref 0.7–3.1)
Lymphs: 21 %
MCH: 29.9 pg (ref 26.6–33.0)
MCHC: 31.8 g/dL (ref 31.5–35.7)
MCV: 94 fL (ref 79–97)
Monocytes Absolute: 0.5 10*3/uL (ref 0.1–0.9)
Monocytes: 7 %
Neutrophils Absolute: 4.1 10*3/uL (ref 1.4–7.0)
Neutrophils: 67 %
Platelets: 217 10*3/uL (ref 150–450)
RBC: 4.55 x10E6/uL (ref 4.14–5.80)
RDW: 12.4 % (ref 11.6–15.4)
WBC: 6.2 10*3/uL (ref 3.4–10.8)

## 2021-09-29 LAB — COMPREHENSIVE METABOLIC PANEL
ALT: 13 IU/L (ref 0–44)
AST: 20 IU/L (ref 0–40)
Albumin/Globulin Ratio: 1.6 (ref 1.2–2.2)
Albumin: 4.6 g/dL (ref 3.8–4.8)
Alkaline Phosphatase: 72 IU/L (ref 44–121)
BUN/Creatinine Ratio: 18 (ref 10–24)
BUN: 14 mg/dL (ref 8–27)
Bilirubin Total: 0.4 mg/dL (ref 0.0–1.2)
CO2: 35 mmol/L — ABNORMAL HIGH (ref 20–29)
Calcium: 9.4 mg/dL (ref 8.6–10.2)
Chloride: 94 mmol/L — ABNORMAL LOW (ref 96–106)
Creatinine, Ser: 0.76 mg/dL (ref 0.76–1.27)
Globulin, Total: 2.8 g/dL (ref 1.5–4.5)
Glucose: 88 mg/dL (ref 70–99)
Potassium: 4.4 mmol/L (ref 3.5–5.2)
Sodium: 140 mmol/L (ref 134–144)
Total Protein: 7.4 g/dL (ref 6.0–8.5)
eGFR: 102 mL/min/{1.73_m2} (ref >59)

## 2021-09-29 LAB — LIPID PANEL
Chol/HDL Ratio: 2.6 ratio (ref 0.0–5.0)
Cholesterol, Total: 221 mg/dL — ABNORMAL HIGH (ref 100–199)
HDL: 85 mg/dL (ref >39)
LDL Chol Calc (NIH): 121 mg/dL — ABNORMAL HIGH (ref 0–99)
Triglycerides: 87 mg/dL (ref 0–149)
VLDL Cholesterol Cal: 15 mg/dL (ref 5–40)

## 2021-09-29 NOTE — Telephone Encounter (Signed)
-----  Message from Elsie Stain, MD sent at 09/29/2021  6:49 AM EDT ----- ?Let pt know kidney and liver normal, cholesterol high stay on cholesterol pill crestor, blood counts normal,  ?

## 2021-09-29 NOTE — Telephone Encounter (Signed)
Pt was called and is aware of results, DOB was confirmed.  ?

## 2021-10-19 NOTE — Progress Notes (Incomplete)
? ? ?Cardiology Office Note ? ? ?Date:  10/23/2021  ? ?ID:  Anthony Nelson, DOB 03-Feb-1961, MRN [id number] ? ?PCP:  Elsie Stain, MD  ?Cardiologist:   Orion Crook  ? ?No chief complaint on file. ? ? ?History of Present Illness: ?Anthony Nelson is a 61 y.o. male with COPD, severe pulmonary hypertension (WHO group 3), aortic atherosclerosis, and prior tobacco abuse here for follow-up. He was initially seen in 2019 for the evaluation of pulmonary hypertension. Anthony Nelson was admitted 01/2018 with a COPD exacerbation. This was a new diagnosis for him. Anthony Nelson has a longstanding history of tobacco abuse. When he came to the ED he had progressive dyspnea. He also had lower extremity edema but no orthopnea or PND. He was hypoxic to 73% on room air and improved to >98% on 2L. Chest CT was negative for PE but suggested elevated R heart disease. It also showed moderate emphysema. Echo that admission revealed LVEF 60-65% with grade 1 diastolic dysfunction, moderately dilated RV with moderate-severely reduced systolic function. PASP was 80 mmHg. Labs were notable for mildly elevated LFTs. BNP was 825.  He was treated with IV steroids and inhalers. He was also diuresed but continued to have an oxygen requirement at discharge. Since that time he has been able to come off oxygen and maintains sats >88% at rest and with exertion. ? ?Anthony Nelson was admitted 03/2020 with hypoxic respiratory failure. EMS found him with an oxygen saturation of 45% on room air. He was placed on a nonrebreather and then BiPAP. He was admitted to the ICU and was able to be titrated back down to 3 L nasal cannula. Echo during that hospitalization revealed LVEF 60 to 65% with normal diastolic function.  He had severe right ventricular failure. PASP was 45 mmHg.  ? ?At his last appointment, he was struggling to use his BiPAP regularly. He followed-up with Laurann Montana, NP 12/2020 and was doing well.  ? ?Today, *** ? ?He denies any palpitations, chest  pain, or shortness of breath, lightheadedness, headaches, syncope, orthopnea, PND, lower extremity edema or exertional symptoms. ? ?Past Medical History:  ?Diagnosis Date  ? Atherosclerosis of aorta ([place]) 04/06/2020  ? Bronchitis   ? COPD (chronic obstructive pulmonary disease) ([place])   ? Diastolic heart failure ([place])   ? GERD (gastroesophageal reflux disease)   ? Pulmonary hypertension ([place])   ? 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 ([place]) 04/10/2018  ? Respiratory failure with hypoxia ([place])   ? ? ?Past Surgical History:  ?Procedure Laterality Date  ? HERNIA REPAIR    ? ? ? ?Current Outpatient Medications  ?Medication Sig Dispense Refill  ? albuterol (PROVENTIL) (2.5 MG/3ML) 0.083% nebulizer solution TAKE 3 MLS (2.5 MG TOTAL) BY NEBULIZATION EVERY 6 (SIX) HOURS AS NEEDED FOR WHEEZING OR SHORTNESS OF BREATH. 75 mL 2  ? albuterol (VENTOLIN HFA) 108 (90 Base) MCG/ACT inhaler Inhale 2 puffs into the lungs every 6 (six) hours as needed for wheezing or shortness of breath. 18 g 2  ? Ascorbic Acid (VITAMIN C) 100 MG tablet Take 1 tablet (100 mg total) by mouth daily. 60 tablet 2  ? aspirin EC 81 MG tablet Take 1 tablet (81 mg total) by mouth daily. Swallow whole. 80 tablet 4  ? Cholecalciferol (VITAMIN D3) 50 MCG (2000 UT) TABS Take 2,000 Units by mouth daily. 30 tablet 3  ? Cyanocobalamin (B-12 PO) Take 1 tablet by mouth daily.    ? dextromethorphan-guaiFENesin (MUCINEX DM) 30-600 MG 12hr  tablet Take 1 tablet by mouth 2 (two) times daily as needed for cough. 60 tablet 4  ? docusate sodium (COLACE) 100 MG capsule Take 1 capsule (100 mg total) by mouth daily. 10 capsule 0  ? famotidine (PEPCID) 20 MG tablet TAKE 1 TABLET (20 MG TOTAL) BY MOUTH 2 (TWO) TIMES DAILY. 60 tablet 3  ? furosemide (LASIX) 20 MG tablet Take 1 tablet (20 mg total) by mouth daily as needed. 30 tablet 2  ? Glycopyrrolate-Formoterol (BEVESPI AEROSPHERE) 9-4.8 MCG/ACT AERO Inhale 1 puff into the lungs in the morning and at  bedtime. 10.7 g 6  ? Multiple Vitamins-Minerals (ZINC PO) Take 1 tablet by mouth daily.    ? OXYGEN Inhale 2 L/min into the lungs continuous.     ? potassium chloride SA (KLOR-CON M) 20 MEQ tablet Take 1 tablet (20 mEq total) by mouth daily as needed. 30 tablet 3  ? rosuvastatin (CRESTOR) 40 MG tablet Take 1 tablet (40 mg total) by mouth daily. 60 tablet 4  ? sodium chloride (OCEAN) 0.65 % SOLN nasal spray Place 2 sprays into both nostrils in the morning, at noon, and at bedtime. 104 mL 3  ? ?No current facility-administered medications for this visit.  ? ? ?Allergies:   Erythromycin  ? ? ?Social History:  The patient  reports that he quit smoking about 15 months ago. His smoking use included cigarettes. He has a 4.40 pack-year smoking history. He has never used smokeless tobacco. He reports that he does not currently use alcohol. He reports that he does not use drugs.  ? ?Family History:  The patient's family history includes CAD in his father; Cancer in his father and mother; Diabetes in his maternal grandmother; Heart attack in his paternal grandfather; Heart failure in his father; Kidney cancer in his paternal grandmother; Kidney disease in his maternal grandmother.  ? ? ?ROS:  Please see the history of present illness.    ?Otherwise, review of systems are positive for none.   All other systems are reviewed and negative.  ? ? ?PHYSICAL EXAM: ?VS:  There were no vitals taken for this visit. , BMI There is no height or weight on file to calculate BMI. ?GENERAL:  Well appearing ?HEENT:  Pupils equal round and reactive, fundi not visualized, oral mucosa unremarkable ?NECK:  No jugular venous distention, waveform within normal limits, carotid upstroke brisk and symmetric, no bruits  ?LUNGS:  Clear to auscultation bilaterally ?HEART:  RRR.  PMI not displaced or sustained,S1 and S2 within normal limits, no S3, no S4, no clicks, no rubs, no  murmurs ?ABD:  Flat, positive bowel sounds normal in frequency in pitch, no  bruits, no rebound, no guarding, no midline pulsatile mass, no hepatomegaly, no splenomegaly ?EXT:  2 plus pulses throughout, no edema, no cyanosis no clubbing ?SKIN:  No rashes no nodules ?NEURO:  Cranial nerves II through XII grossly intact, motor grossly intact throughout ?PSYCH:  Cognitively intact, oriented to person place and time ? ? ?EKG:  EKG is not ordered today. ?04/04/18: sinus rhythm.  Rate 99 bpm.  Right atrial enlargement.  Anterior T wave and normalities. ? ?Echo 03/09/2020: ?IMPRESSIONS  ? 1. Severe RV failure.  ? 2. Left ventricular ejection fraction, by estimation, is 60 to 65%. The  ?left ventricle has normal function. The left ventricle has no regional  ?wall motion abnormalities. Left ventricular diastolic parameters were  ?normal.  ? 3. Right ventricular systolic function is severely reduced. The right  ?ventricular size is  severely enlarged. There is moderately elevated  ?pulmonary artery systolic pressure.  ? 4. The mitral valve is normal in structure. Trivial mitral valve  ?regurgitation. No evidence of mitral stenosis.  ? 5. Tricuspid valve regurgitation is mild to moderate.  ? 6. The aortic valve is tricuspid. Aortic valve regurgitation is not  ?visualized. Mild aortic valve sclerosis is present, with no evidence of  ?aortic valve stenosis.  ? 7. The inferior vena cava is dilated in size with <50% respiratory  ?variability, suggesting right atrial pressure of 15 mmHg.  ? ?Recent Labs: ?09/28/2021: ALT 13; BUN 14; Creatinine, Ser 0.76; Hemoglobin 13.6; Platelets 217; Potassium 4.4; Sodium 140  ? ? ?Lipid Panel ?   ?Component Value Date/Time  ? CHOL 221 (H) 09/28/2021 1222  ? TRIG 87 09/28/2021 1222  ? HDL 85 09/28/2021 1222  ? CHOLHDL 2.6 09/28/2021 1222  ? Blue 121 (H) 09/28/2021 1222  ? ?  ? ?Wt Readings from Last 3 Encounters:  ?09/28/21 134 lb 6.4 oz (61 kg)  ?05/02/21 144 lb (65.3 kg)  ?01/27/21 142 lb 9.6 oz (64.7 kg)  ?  ? ? ?ASSESSMENT AND PLAN: ?No problem-specific Assessment &  Plan notes found for this encounter. ? ?# Severe pulmonary hypertension, WHO class 3: ?# Tobacco abuse: ?PASP was 80 mmHg in 2019 and 45 mmHg 03/2020.  This likely represents progressive RV failure rather than imp

## 2021-10-23 ENCOUNTER — Ambulatory Visit (HOSPITAL_BASED_OUTPATIENT_CLINIC_OR_DEPARTMENT_OTHER): Payer: Medicaid Other | Admitting: Cardiovascular Disease

## 2021-11-06 NOTE — Progress Notes (Incomplete)
? ? ?Cardiology Office Note ? ? ?Date:  11/06/2021  ? ?ID:  Anthony Nelson, DOB October 12, 1960, MRN [id number] ? ?PCP:  Elsie Stain, MD  ?Cardiologist:   Madelin Rear  ? ?No chief complaint on file. ? ? ?History of Present Illness: ?Anthony Nelson is a 61 y.o. male with COPD, severe pulmonary hypertension (WHO group 3), aortic atherosclerosis, and prior tobacco abuse here for follow-up.  He was initially seen in 2019 for the evaluation of pulmonary hypertension.  Anthony Nelson was admitted 01/2018 with a COPD exacerbation. This was a new diagnosis for him.  Anthony Nelson has a longstanding history of tobacco abuse.  When he came to the ED he had progressive dyspnea.  He also had lower extremity edema but no orthopnea or PND.  He was hypoxic to 73% on room air and improved to >98% on 2L.  Chest CT was negative for PE but suggested elevated R heart disease.  It also showed moderate emphysema.  Echo that admission revealed LVEF 60-65% with grade 1 diastolic dysfunction, moderately dilated RV with moderate-severely reduced systolic function.  PASP was 80 mmHg.  Labs were notable for mildly elevated LFTs.  BNP was 825.  He was treated with IV steroids and inhalers.  He was also diuresed but continued to have an oxygen requirement at discharge.  Since that time he has been able to come off oxygen and maintains sats >88% at rest and with exertion.  He was to follow up with cardioloy in 2 months and hasn't been seen. ? ?Anthony Nelson was admitted 03/2020 with hypoxic respiratory failure.  EMS found him with an oxygen saturation of 45% on room air.  He was placed on a nonrebreather and then BiPAP.  He was admitted to the ICU and was able to be titrated back down to 3 L nasal cannula.  Echo during that hospitalization revealed LVEF 60 to 65% with normal diastolic function.  He had severe right ventricular failure.  PASP was 45 mmHg.  Since being discharged she has been doing much better.  He feels like his breathing is back to baseline and  he is gaining weight.  He stopped smoking by going cold [place].  He still struggles with wearing the BiPAP.  The first time he wore it is when he woke up in the ICU and this was very traumatic for him.  He is trying to wear it for longer and longer periods of time at home.  He has had very mild edema denies any orthopnea or PND.  He has no chest pain or pressure.  He has been trying to walk for about 10 minutes at a time. ? ? ?Past Medical History:  ?Diagnosis Date  ? Atherosclerosis of aorta ([place]) 04/06/2020  ? Bronchitis   ? COPD (chronic obstructive pulmonary disease) ([place])   ? Diastolic heart failure ([place])   ? GERD (gastroesophageal reflux disease)   ? Pulmonary hypertension ([place])   ? 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 ([place]) 04/10/2018  ? Respiratory failure with hypoxia ([place])   ? ? ?Past Surgical History:  ?Procedure Laterality Date  ? HERNIA REPAIR    ? ? ? ?Current Outpatient Medications  ?Medication Sig Dispense Refill  ? albuterol (PROVENTIL) (2.5 MG/3ML) 0.083% nebulizer solution TAKE 3 MLS (2.5 MG TOTAL) BY NEBULIZATION EVERY 6 (SIX) HOURS AS NEEDED FOR WHEEZING OR SHORTNESS OF BREATH. 75 mL 2  ? albuterol (VENTOLIN HFA) 108 (90 Base) MCG/ACT inhaler Inhale 2 puffs into the lungs  every 6 (six) hours as needed for wheezing or shortness of breath. 18 g 2  ? Ascorbic Acid (VITAMIN C) 100 MG tablet Take 1 tablet (100 mg total) by mouth daily. 60 tablet 2  ? aspirin EC 81 MG tablet Take 1 tablet (81 mg total) by mouth daily. Swallow whole. 80 tablet 4  ? Cholecalciferol (VITAMIN D3) 50 MCG (2000 UT) TABS Take 2,000 Units by mouth daily. 30 tablet 3  ? Cyanocobalamin (B-12 PO) Take 1 tablet by mouth daily.    ? dextromethorphan-guaiFENesin (MUCINEX DM) 30-600 MG 12hr tablet Take 1 tablet by mouth 2 (two) times daily as needed for cough. 60 tablet 4  ? docusate sodium (COLACE) 100 MG capsule Take 1 capsule (100 mg total) by mouth daily. 10 capsule 0  ? famotidine (PEPCID) 20 MG  tablet TAKE 1 TABLET (20 MG TOTAL) BY MOUTH 2 (TWO) TIMES DAILY. 60 tablet 3  ? furosemide (LASIX) 20 MG tablet Take 1 tablet (20 mg total) by mouth daily as needed. 30 tablet 2  ? Glycopyrrolate-Formoterol (BEVESPI AEROSPHERE) 9-4.8 MCG/ACT AERO Inhale 1 puff into the lungs in the morning and at bedtime. 10.7 g 6  ? Multiple Vitamins-Minerals (ZINC PO) Take 1 tablet by mouth daily.    ? OXYGEN Inhale 2 L/min into the lungs continuous.     ? potassium chloride SA (KLOR-CON M) 20 MEQ tablet Take 1 tablet (20 mEq total) by mouth daily as needed. 30 tablet 3  ? rosuvastatin (CRESTOR) 40 MG tablet Take 1 tablet (40 mg total) by mouth daily. 60 tablet 4  ? sodium chloride (OCEAN) 0.65 % SOLN nasal spray Place 2 sprays into both nostrils in the morning, at noon, and at bedtime. 104 mL 3  ? ?No current facility-administered medications for this visit.  ? ? ?Allergies:   Erythromycin  ? ? ?Social History:  The patient  reports that he quit smoking about 15 months ago. His smoking use included cigarettes. He has a 4.40 pack-year smoking history. He has never used smokeless tobacco. He reports that he does not currently use alcohol. He reports that he does not use drugs.  ? ?Family History:  The patient's family history includes CAD in his father; Cancer in his father and mother; Diabetes in his maternal grandmother; Heart attack in his paternal grandfather; Heart failure in his father; Kidney cancer in his paternal grandmother; Kidney disease in his maternal grandmother.  ? ? ?ROS:  Please see the history of present illness.   Otherwise, review of systems are positive for none.   All other systems are reviewed and negative.  ? ? ?PHYSICAL EXAM: ?VS:  There were no vitals taken for this visit. , BMI There is no height or weight on file to calculate BMI. ?GENERAL:  Well appearing ?HEENT:  Pupils equal round and reactive, fundi not visualized, oral mucosa unremarkable ?NECK:  No jugular venous distention, waveform within normal  limits, carotid upstroke brisk and symmetric, no bruits  ?LUNGS:  Clear to auscultation bilaterally ?HEART:  RRR.  PMI not displaced or sustained,S1 and S2 within normal limits, no S3, no S4, no clicks, no rubs, no  murmurs ?ABD:  Flat, positive bowel sounds normal in frequency in pitch, no bruits, no rebound, no guarding, no midline pulsatile mass, no hepatomegaly, no splenomegaly ?EXT:  2 plus pulses throughout, no edema, no cyanosis no clubbing ?SKIN:  No rashes no nodules ?NEURO:  Cranial nerves II through XII grossly intact, motor grossly intact throughout ?PSYCH:  Cognitively  intact, oriented to person place and time ? ? ?EKG:  EKG is not ordered today. ?The ekg ordered today demonstrates sinus rhythm.  Rate 99 bpm.  Right atrial enlargement.  Anterior T wave and normalities. ? ?Echo 03/09/2020: ?IMPRESSIONS  ? 1. Severe RV failure.  ? 2. Left ventricular ejection fraction, by estimation, is 60 to 65%. The  ?left ventricle has normal function. The left ventricle has no regional  ?wall motion abnormalities. Left ventricular diastolic parameters were  ?normal.  ? 3. Right ventricular systolic function is severely reduced. The right  ?ventricular size is severely enlarged. There is moderately elevated  ?pulmonary artery systolic pressure.  ? 4. The mitral valve is normal in structure. Trivial mitral valve  ?regurgitation. No evidence of mitral stenosis.  ? 5. Tricuspid valve regurgitation is mild to moderate.  ? 6. The aortic valve is tricuspid. Aortic valve regurgitation is not  ?visualized. Mild aortic valve sclerosis is present, with no evidence of  ?aortic valve stenosis.  ? 7. The inferior vena cava is dilated in size with <50% respiratory  ?variability, suggesting right atrial pressure of 15 mmHg.  ? ?Recent Labs: ?09/28/2021: ALT 13; BUN 14; Creatinine, Ser 0.76; Hemoglobin 13.6; Platelets 217; Potassium 4.4; Sodium 140  ? ? ?Lipid Panel ?   ?Component Value Date/Time  ? CHOL 221 (H) 09/28/2021 1222  ? TRIG  87 09/28/2021 1222  ? HDL 85 09/28/2021 1222  ? CHOLHDL 2.6 09/28/2021 1222  ? [place] 121 (H) 09/28/2021 1222  ? ?  ? ?Wt Readings from Last 3 Encounters:  ?09/28/21 134 lb 6.4 oz (61 kg)  ?05/02/21 144

## 2021-11-07 ENCOUNTER — Ambulatory Visit (HOSPITAL_BASED_OUTPATIENT_CLINIC_OR_DEPARTMENT_OTHER): Payer: Medicaid Other | Admitting: Cardiovascular Disease

## 2021-11-20 ENCOUNTER — Telehealth: Payer: Self-pay

## 2021-11-20 NOTE — Telephone Encounter (Signed)
Call placed to [facility], spoke to [place] who said that the nebulizer was delivered to the patient on 09/29/2021.

## 2021-12-14 NOTE — Progress Notes (Incomplete)
{  Select_TRH_Note:[id number]}

## 2021-12-25 ENCOUNTER — Other Ambulatory Visit: Payer: Self-pay

## 2021-12-26 ENCOUNTER — Other Ambulatory Visit: Payer: Self-pay

## 2021-12-28 ENCOUNTER — Other Ambulatory Visit: Payer: Self-pay

## 2022-01-23 NOTE — Progress Notes (Unsigned)
{  Select_TRH_Note:[id number]}

## 2022-01-28 NOTE — Progress Notes (Signed)
Established Patient Office Visit  Subjective:  Patient ID: Anthony Nelson, male    DOB: 02-05-1961  Age: 61 y.o. MRN: [id number]  CC: Primary care follow-up   HPI 05/02/21 Anthony Nelson presents for primary care visit.  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  He just saw cardiology in July.  He is on chronic 2 L of oxygen home oxygen and is on low-dose furosemide and BiPAP at night.  He also maintains his inhalers and is no longer smoking cigarettes for a year now.  Patient's biggest complaint is that of chronic shoulder pain on the right.  He played sports when he was younger.  He is uncertain whether he tore her rotator cuff or not but is been an issue for about 2 years.  He is not able to abduct the shoulder above the plane of the collarbone.  He has pain anteriorly in the shoulder as well.  He uses over-the-counter medications for this.  Patient needs a repeat lipid panel has not had one since October 2021 goal is LDL less than 70  Patient agrees to and did receive both the Prevnar 20 vaccine and flu shot  09/28/21 Patient seen today for primary care follow-up.  He has severe COPD Gold stage IV and is on BiPAP at night with 2 L oxygen he takes 2 L during the day as well.  He does need a new nebulizer machine.  Unfortunately the patient has lost his housing.  He now lives in a motel.  Patient states his breathing is at baseline. Patient states his shoulder is no longer hurting. Mental health is stable.  7/31 Since the last visit the patient has had improvement in that he now lives with a servant house he has meals there.  He maintains oxygen 2 L but would like to have a light weight portable oxygen concentrator.  He also would be a candidate for pulmonary rehab.  He has minimal productive cough his dyspnea is at baseline.  On arrival blood pressure is 110/69.  He has been to see his primary care provider in the [place]  system but most of his care is here privately  Past Medical History:  Diagnosis Date   Atherosclerosis of aorta ([place]) 04/06/2020   Bronchitis    COPD (chronic obstructive pulmonary disease) (HCC)    Diastolic heart failure (HCC)    GERD (gastroesophageal reflux disease)    Pulmonary hypertension ([place])    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 ([place]) 04/10/2018   Respiratory failure with hypoxia ([place])     Past Surgical History:  Procedure Laterality Date   HERNIA REPAIR      Family History  Problem Relation Age of Onset   Cancer Mother    Cancer Father    CAD Father    Heart failure Father    Diabetes Maternal Grandmother    Kidney disease Maternal Grandmother    Kidney cancer Paternal Grandmother    Heart attack Paternal Grandfather     Social History   Socioeconomic History   Marital status: Married    Spouse name: Anthony Nelson   Number of children: 3   Years of education: 16   Highest education level: Bachelor's degree (e.g., BA, AB, BS)  Occupational History   Occupation: Security Guard  Tobacco Use   Smoking status: Former    Packs/day: 0.10    Years: 44.00    Total pack years:  4.40    Types: Cigarettes    Quit date: 07/20/2020    Years since quitting: 1.5   Smokeless tobacco: Never  Vaping Use   Vaping Use: Never used  Substance and Sexual Activity   Alcohol use: Not Currently    Comment: Socially    Drug use: No   Sexual activity: Not Currently  Other Topics Concern   Not on file  Social History Narrative   Not on file   Social Determinants of Health   Financial Resource Strain: Not on file  Food Insecurity: Not on file  Transportation Needs: Not on file  Physical Activity: Not on file  Stress: Not on file  Social Connections: Not on file  Intimate Partner Violence: Not on file    Outpatient Medications Prior to Visit  Medication Sig Dispense Refill   albuterol (PROVENTIL) (2.5 MG/3ML) 0.083% nebulizer  solution TAKE 3 MLS (2.5 MG TOTAL) BY NEBULIZATION EVERY 6 (SIX) HOURS AS NEEDED FOR WHEEZING OR SHORTNESS OF BREATH. 75 mL 2   Ascorbic Acid (VITAMIN C) 100 MG tablet Take 1 tablet (100 mg total) by mouth daily. 60 tablet 2   aspirin EC 81 MG tablet Take 1 tablet (81 mg total) by mouth daily. Swallow whole. 80 tablet 4   Cholecalciferol (VITAMIN D3) 50 MCG (2000 UT) TABS Take 2,000 Units by mouth daily. 30 tablet 3   Cyanocobalamin (B-12 PO) Take 1 tablet by mouth daily.     dextromethorphan-guaiFENesin (MUCINEX DM) 30-600 MG 12hr tablet Take 1 tablet by mouth 2 (two) times daily as needed for cough. 60 tablet 4   docusate sodium (COLACE) 100 MG capsule Take 1 capsule (100 mg total) by mouth daily. 10 capsule 0   famotidine (PEPCID) 20 MG tablet TAKE 1 TABLET (20 MG TOTAL) BY MOUTH 2 (TWO) TIMES DAILY. 60 tablet 3   furosemide (LASIX) 20 MG tablet Take 1 tablet (20 mg total) by mouth daily as needed. 30 tablet 2   Glycopyrrolate-Formoterol (BEVESPI AEROSPHERE) 9-4.8 MCG/ACT AERO Inhale 1 puff into the lungs in the morning and at bedtime. 10.7 g 6   Multiple Vitamins-Minerals (ZINC PO) Take 1 tablet by mouth daily.     OXYGEN Inhale 2 L/min into the lungs continuous.      potassium chloride SA (KLOR-CON M) 20 MEQ tablet Take 1 tablet (20 mEq total) by mouth daily as needed. 30 tablet 3   sodium chloride (OCEAN) 0.65 % SOLN nasal spray Place 2 sprays into both nostrils in the morning, at noon, and at bedtime. 104 mL 3   albuterol (VENTOLIN HFA) 108 (90 Base) MCG/ACT inhaler Inhale 2 puffs into the lungs every 6 (six) hours as needed for wheezing or shortness of breath. 18 g 2   rosuvastatin (CRESTOR) 40 MG tablet Take 1 tablet (40 mg total) by mouth daily. 60 tablet 4   No facility-administered medications prior to visit.    Allergies  Allergen Reactions   Erythromycin Other (See Comments)    Allergy from childhood- does not take it    ROS Review of Systems  Constitutional: Negative.    HENT: Negative.  Negative for ear pain, postnasal drip, rhinorrhea, sinus pressure, sore throat, trouble swallowing and voice change.   Eyes: Negative.   Respiratory:  Positive for shortness of breath. Negative for apnea, cough, choking, chest tightness, wheezing and stridor.        Mucus is clear to yellow  Cardiovascular: Negative.  Negative for chest pain, palpitations and leg swelling.  Gastrointestinal:  Negative.  Negative for abdominal distention, abdominal pain, nausea and vomiting.  Genitourinary: Negative.   Musculoskeletal: Negative.  Negative for arthralgias and myalgias.  Skin: Negative.  Negative for rash.  Allergic/Immunologic: Negative.  Negative for environmental allergies and food allergies.  Neurological: Negative.  Negative for dizziness, syncope, weakness and headaches.  Hematological: Negative.  Negative for adenopathy. Does not bruise/bleed easily.  Psychiatric/Behavioral: Negative.  Negative for agitation, sleep disturbance and suicidal ideas. The patient is not nervous/anxious.       Objective:    Physical Exam Vitals reviewed.  Constitutional:      Appearance: Normal appearance. He is well-developed. He is not diaphoretic.     Comments: Thin  HENT:     Head: Normocephalic and atraumatic.     Nose: No nasal deformity, septal deviation, mucosal edema or rhinorrhea.     Right Sinus: No maxillary sinus tenderness or frontal sinus tenderness.     Left Sinus: No maxillary sinus tenderness or frontal sinus tenderness.     Mouth/Throat:     Pharynx: No oropharyngeal exudate.  Eyes:     General: No scleral icterus.    Conjunctiva/sclera: Conjunctivae normal.     Pupils: Pupils are equal, round, and reactive to light.  Neck:     Thyroid: No thyromegaly.     Vascular: No carotid bruit or JVD.     Trachea: Trachea normal. No tracheal tenderness or tracheal deviation.  Cardiovascular:     Rate and Rhythm: Normal rate and regular rhythm.     Chest Wall: PMI is not  displaced.     Pulses: Normal pulses. No decreased pulses.     Heart sounds: Normal heart sounds, S1 normal and S2 normal. Heart sounds not distant. No murmur heard.    No systolic murmur is present.     No diastolic murmur is present.     No friction rub. No gallop. No S3 or S4 sounds.  Pulmonary:     Effort: Pulmonary effort is normal. No tachypnea, accessory muscle usage or respiratory distress.     Breath sounds: No stridor. No decreased breath sounds, wheezing, rhonchi or rales.     Comments: Distant breath sounds Chest:     Chest wall: No tenderness.  Abdominal:     General: Bowel sounds are normal. There is no distension.     Palpations: Abdomen is soft. Abdomen is not rigid.     Tenderness: There is no abdominal tenderness. There is no guarding or rebound.  Musculoskeletal:        General: Normal range of motion.     Cervical back: Normal range of motion and neck supple. No edema, erythema or rigidity. No muscular tenderness. Normal range of motion.  Lymphadenopathy:     Head:     Right side of head: No submental or submandibular adenopathy.     Left side of head: No submental or submandibular adenopathy.     Cervical: No cervical adenopathy.  Skin:    General: Skin is warm and dry.     Coloration: Skin is not pale.     Findings: No rash.     Nails: There is no clubbing.  Neurological:     Mental Status: He is alert and oriented to person, place, and time.     Sensory: No sensory deficit.  Psychiatric:        Mood and Affect: Mood normal.        Speech: Speech normal.        Behavior:  Behavior normal.     BP 110/69   Pulse 83   Ht 5' 10.5" (1.791 m)   Wt 126 lb 9.6 oz (57.4 kg)   SpO2 97%   BMI 17.91 kg/m  Wt Readings from Last 3 Encounters:  01/29/22 126 lb 9.6 oz (57.4 kg)  09/28/21 134 lb 6.4 oz (61 kg)  05/02/21 144 lb (65.3 kg)     Health Maintenance Due  Topic Date Due   COVID-19 Vaccine (3 - Moderna risk series) 06/01/2020   COLON CANCER  SCREENING ANNUAL FOBT  08/23/2020    There are no preventive care reminders to display for this patient.  Lab Results  Component Value Date   TSH 1.757 04/25/2019   Lab Results  Component Value Date   WBC 6.2 09/28/2021   HGB 13.6 09/28/2021   HCT 42.8 09/28/2021   MCV 94 09/28/2021   PLT 217 09/28/2021   Lab Results  Component Value Date   NA 140 09/28/2021   K 4.4 09/28/2021   CO2 35 (H) 09/28/2021   GLUCOSE 88 09/28/2021   BUN 14 09/28/2021   CREATININE 0.76 09/28/2021   BILITOT 0.4 09/28/2021   ALKPHOS 72 09/28/2021   AST 20 09/28/2021   ALT 13 09/28/2021   PROT 7.4 09/28/2021   ALBUMIN 4.6 09/28/2021   CALCIUM 9.4 09/28/2021   ANIONGAP 10 09/07/2020   EGFR 102 09/28/2021   Lab Results  Component Value Date   CHOL 221 (H) 09/28/2021   Lab Results  Component Value Date   HDL 85 09/28/2021   Lab Results  Component Value Date   LDLCALC 121 (H) 09/28/2021   Lab Results  Component Value Date   TRIG 87 09/28/2021   Lab Results  Component Value Date   CHOLHDL 2.6 09/28/2021   Lab Results  Component Value Date   HGBA1C 5.9 (H) 03/08/2020      Assessment & Plan:   Problem List Items Addressed This Visit       Cardiovascular and Mediastinum   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)    Chronic obstructive lung disease with pulmonary hypertension stable at this time oxygenation is stable he would benefit from pulmonary rehab referral has been made no change in inhaled medication      Relevant Medications   rosuvastatin (CRESTOR) 40 MG tablet   Chronic diastolic heart failure (HCC)    Compensated diastolic heart failure      Relevant Medications   rosuvastatin (CRESTOR) 40 MG tablet   Atherosclerosis of aorta (HCC)   Relevant Medications   rosuvastatin (CRESTOR) 40 MG tablet     Respiratory   COPD GOLD IV     InhalerContinue stability with Gold stage IV COPD continue medications      Relevant Medications    albuterol (VENTOLIN HFA) 108 (90 Base) MCG/ACT inhaler   Other Relevant Orders   AMB referral to pulmonary rehabilitation   For home use only DME oxygen   Chronic respiratory failure with hypoxia and hypercapnia (HCC)    We will assess patient for pulmonary rehab and will also she can get a portable oxygen concentrator at 2 L      Relevant Orders   AMB referral to pulmonary rehabilitation   For home use only DME oxygen     Other   Hyperlipidemia    Continue with statin therapy      Relevant Medications   rosuvastatin (CRESTOR) 40 MG tablet   RESOLVED: Living in motel  Now is at the [facility] now is housed      Other Visit Diagnoses     Colon cancer screening    -  Primary   Relevant Orders   Fecal occult blood, imunochemical      Meds ordered this encounter  Medications   rosuvastatin (CRESTOR) 40 MG tablet    Sig: Take 1 tablet (40 mg total) by mouth daily.    Dispense:  60 tablet    Refill:  4   albuterol (VENTOLIN HFA) 108 (90 Base) MCG/ACT inhaler    Sig: Inhale 2 puffs into the lungs every 6 (six) hours as needed for wheezing or shortness of breath.    Dispense:  18 g    Refill:  2   Colon cancer screening with fecal occult kit Follow-up: Return in about 4 months (around 05/31/2022) for chronic conditions.    Asencion Noble, MD

## 2022-01-29 ENCOUNTER — Encounter: Payer: Self-pay | Admitting: Critical Care Medicine

## 2022-01-29 ENCOUNTER — Ambulatory Visit: Payer: Medicare Other | Attending: Critical Care Medicine | Admitting: Critical Care Medicine

## 2022-01-29 ENCOUNTER — Other Ambulatory Visit: Payer: Self-pay

## 2022-01-29 VITALS — BP 110/69 | HR 83 | Ht 70.5 in | Wt 126.6 lb

## 2022-01-29 DIAGNOSIS — Z79899 Other long term (current) drug therapy: Secondary | ICD-10-CM | POA: Insufficient documentation

## 2022-01-29 DIAGNOSIS — J9611 Chronic respiratory failure with hypoxia: Secondary | ICD-10-CM

## 2022-01-29 DIAGNOSIS — I2723 Pulmonary hypertension due to lung diseases and hypoxia: Secondary | ICD-10-CM

## 2022-01-29 DIAGNOSIS — J9612 Chronic respiratory failure with hypercapnia: Secondary | ICD-10-CM

## 2022-01-29 DIAGNOSIS — I700 Atherosclerosis of aorta: Secondary | ICD-10-CM | POA: Diagnosis not present

## 2022-01-29 DIAGNOSIS — I272 Pulmonary hypertension, unspecified: Secondary | ICD-10-CM | POA: Diagnosis not present

## 2022-01-29 DIAGNOSIS — Z23 Encounter for immunization: Secondary | ICD-10-CM | POA: Diagnosis not present

## 2022-01-29 DIAGNOSIS — Z87891 Personal history of nicotine dependence: Secondary | ICD-10-CM | POA: Diagnosis not present

## 2022-01-29 DIAGNOSIS — Z9981 Dependence on supplemental oxygen: Secondary | ICD-10-CM | POA: Insufficient documentation

## 2022-01-29 DIAGNOSIS — Z1211 Encounter for screening for malignant neoplasm of colon: Secondary | ICD-10-CM

## 2022-01-29 DIAGNOSIS — M25511 Pain in right shoulder: Secondary | ICD-10-CM | POA: Diagnosis not present

## 2022-01-29 DIAGNOSIS — E782 Mixed hyperlipidemia: Secondary | ICD-10-CM

## 2022-01-29 DIAGNOSIS — J449 Chronic obstructive pulmonary disease, unspecified: Secondary | ICD-10-CM | POA: Diagnosis present

## 2022-01-29 DIAGNOSIS — I5032 Chronic diastolic (congestive) heart failure: Secondary | ICD-10-CM | POA: Diagnosis not present

## 2022-01-29 DIAGNOSIS — Z5901 Sheltered homelessness: Secondary | ICD-10-CM

## 2022-01-29 DIAGNOSIS — K219 Gastro-esophageal reflux disease without esophagitis: Secondary | ICD-10-CM | POA: Insufficient documentation

## 2022-01-29 MED ORDER — ALBUTEROL SULFATE HFA 108 (90 BASE) MCG/ACT IN AERS
2.0000 | INHALATION_SPRAY | Freq: Four times a day (QID) | RESPIRATORY_TRACT | 2 refills | Status: DC | PRN
  Filled 2022-01-29: qty 18, 25d supply, fill #0
  Filled 2022-03-13: qty 18, 25d supply, fill #1

## 2022-01-29 MED ORDER — ROSUVASTATIN CALCIUM 40 MG PO TABS
40.0000 mg | ORAL_TABLET | Freq: Every day | ORAL | 4 refills | Status: DC
  Filled 2022-01-29: qty 60, 60d supply, fill #0

## 2022-01-29 NOTE — Assessment & Plan Note (Signed)
Now is at the [facility] now is housed

## 2022-01-29 NOTE — Patient Instructions (Signed)
Referral to pulmonary rehab will be made  Portable oxygen concentrator will be obtained  Refills on your medications sent to the pharmacy downstairs  Let us know when you achieve [employer]  Pick up another fecal occult kit in case the 1 you mailed in does not able to be processed we will call you if you need to reprocess and you will need to bring it in and drop it off on your own  Return to see Dr. [name] for 4 month

## 2022-01-29 NOTE — Assessment & Plan Note (Signed)
Chronic obstructive lung disease with pulmonary hypertension stable at this time oxygenation is stable he would benefit from pulmonary rehab referral has been made no change in inhaled medication

## 2022-01-29 NOTE — Assessment & Plan Note (Signed)
We will assess patient for pulmonary rehab and will also she can get a portable oxygen concentrator at 2 L

## 2022-01-29 NOTE — Assessment & Plan Note (Signed)
Continue with statin therapy.  ?

## 2022-01-29 NOTE — Assessment & Plan Note (Signed)
InhalerContinue stability with Gold stage IV COPD continue medications

## 2022-01-29 NOTE — Assessment & Plan Note (Signed)
Compensated diastolic heart failure

## 2022-01-31 ENCOUNTER — Other Ambulatory Visit: Payer: Self-pay | Admitting: Critical Care Medicine

## 2022-01-31 ENCOUNTER — Telehealth: Payer: Self-pay

## 2022-01-31 DIAGNOSIS — J449 Chronic obstructive pulmonary disease, unspecified: Secondary | ICD-10-CM

## 2022-01-31 DIAGNOSIS — J9611 Chronic respiratory failure with hypoxia: Secondary | ICD-10-CM

## 2022-01-31 NOTE — Telephone Encounter (Signed)
Order for [facility] faxed to [facility]

## 2022-01-31 NOTE — Progress Notes (Signed)
Pulm rehab referral

## 2022-02-04 NOTE — Progress Notes (Signed)
Cardiology Office Note   Date:  02/05/2022   ID:  Anthony Nelson, DOB [birth year], MRN [id number]  PCP:  Anthony Stain, MD  Cardiologist:   Skeet Latch, MD   No chief complaint on file.   History of Present Illness: Anthony Nelson is a 61 y.o. male with COPD, severe pulmonary hypertension (WHO group 3), aortic atherosclerosis, and prior tobacco abuse here for follow-up.  He was initially seen in 2019 for the evaluation of pulmonary hypertension.  Anthony Nelson was admitted 01/2018 with a COPD exacerbation. This was a new diagnosis for him.  Anthony Nelson has a longstanding history of tobacco abuse.  When he came to the ED he had progressive dyspnea.  He also had lower extremity edema but no orthopnea or PND.  He was hypoxic to 73% on room air and improved to >98% on 2L.  Chest CT was negative for PE but suggested elevated R heart disease.  It also showed moderate emphysema.  Echo that admission revealed LVEF 60-65% with grade 1 diastolic dysfunction, moderately dilated RV with moderate-severely reduced systolic function.  PASP was 80 mmHg.  Labs were notable for mildly elevated LFTs.  BNP was 825.  He was treated with IV steroids and inhalers.  He was also diuresed but continued to have an oxygen requirement at discharge.  Since that time he has been able to come off oxygen and maintains sats >88% at rest and with exertion.  He was to follow up with cardioloy in 2 months and hasn't been seen.  Anthony Nelson was admitted 03/2020 with hypoxic respiratory failure.  EMS found him with an oxygen saturation of 45% on room air.  He was placed on a nonrebreather and then BiPAP.  He was admitted to the ICU and was able to be titrated back down to 3 L nasal cannula.  Echo during that hospitalization revealed LVEF 60 to 65% with normal diastolic function.  He had severe right ventricular failure.  PASP was 45 mmHg.  At his last appointment he struggled with BiPAP use but was encouraged to use it.  Recommended  limiting sodium and fluid intake.  He saw Laurann Montana, NP on 12/2020 and continued to struggle with his breathing but was overall stable.  He notes that his breathing has been stable in the last year.  If he doesn't have his nebulizer his breathing is worse.  He has no LE edema.  He rarely needs lasix.  He has no chest pain.  He uses albuterol once every 2-3 weeks at baseline.  In the last few weeks he has had more episodes of shortness of breath.  He thinks that this is due to anxiety.  He is have a lot of transition with his housing situation.  His home was foreclosed on and he is now living in housing through the [place].  Pretty soon he will get a voucher to be able to live on his own again.  He notes that his breathing is worse when it is humid and in this situation he does not have the ability to control the air, which makes his breathing more difficult.  He has not had any lower extremity edema, orthopnea, or PND.  He denies chest pain or pressure.  He wants to start working out but does not know exactly what he can do.  He has been using 2 L of oxygen.  He was referred for pulmonary rehab but has not yet started it.   Past Medical History:  Diagnosis Date   Atherosclerosis of aorta ([place]) 04/06/2020   Bronchitis    COPD (chronic obstructive pulmonary disease) (HCC)    Diastolic heart failure (HCC)    GERD (gastroesophageal reflux disease)    Pulmonary hypertension ([place])    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 ([place]) 04/10/2018   Respiratory failure with hypoxia (HCC)     Past Surgical History:  Procedure Laterality Date   HERNIA REPAIR       Current Outpatient Medications  Medication Sig Dispense Refill   albuterol (PROVENTIL) (2.5 MG/3ML) 0.083% nebulizer solution TAKE 3 MLS (2.5 MG TOTAL) BY NEBULIZATION EVERY 6 (SIX) HOURS AS NEEDED FOR WHEEZING OR SHORTNESS OF BREATH. 75 mL 2   albuterol (VENTOLIN HFA) 108 (90 Base) MCG/ACT inhaler Inhale 2 puffs into  the lungs every 6 (six) hours as needed for wheezing or shortness of breath. 18 g 2   Ascorbic Acid (VITAMIN C) 100 MG tablet Take 1 tablet (100 mg total) by mouth daily. 60 tablet 2   aspirin EC 81 MG tablet Take 1 tablet (81 mg total) by mouth daily. Swallow whole. 80 tablet 4   Cholecalciferol (VITAMIN D3) 50 MCG (2000 UT) TABS Take 2,000 Units by mouth daily. 30 tablet 3   Cyanocobalamin (B-12 PO) Take 1 tablet by mouth daily.     dextromethorphan-guaiFENesin (MUCINEX DM) 30-600 MG 12hr tablet Take 1 tablet by mouth 2 (two) times daily as needed for cough. 60 tablet 4   docusate sodium (COLACE) 100 MG capsule Take 1 capsule (100 mg total) by mouth daily. 10 capsule 0   famotidine (PEPCID) 20 MG tablet TAKE 1 TABLET (20 MG TOTAL) BY MOUTH 2 (TWO) TIMES DAILY. 60 tablet 3   furosemide (LASIX) 20 MG tablet Take 1 tablet (20 mg total) by mouth daily as needed. 30 tablet 2   Glycopyrrolate-Formoterol (BEVESPI AEROSPHERE) 9-4.8 MCG/ACT AERO Inhale 1 puff into the lungs in the morning and at bedtime. 10.7 g 6   Multiple Vitamins-Minerals (ZINC PO) Take 1 tablet by mouth daily.     OXYGEN Inhale 2 L/min into the lungs continuous.      potassium chloride SA (KLOR-CON M) 20 MEQ tablet Take 1 tablet (20 mEq total) by mouth daily as needed. 30 tablet 3   rosuvastatin (CRESTOR) 40 MG tablet Take 1 tablet (40 mg total) by mouth daily. 60 tablet 4   sodium chloride (OCEAN) 0.65 % SOLN nasal spray Place 2 sprays into both nostrils in the morning, at noon, and at bedtime. 104 mL 3   No current facility-administered medications for this visit.    Allergies:   Erythromycin    Social History:  The patient  reports that he quit smoking about 18 months ago. His smoking use included cigarettes. He has a 4.40 pack-year smoking history. He has never used smokeless tobacco. He reports that he does not currently use alcohol. He reports that he does not use drugs.   Family History:  The patient's family history  includes CAD in his father; Cancer in his father and mother; Diabetes in his maternal grandmother; Heart attack in his paternal grandfather; Heart failure in his father; Kidney cancer in his paternal grandmother; Kidney disease in his maternal grandmother.    ROS:  Please see the history of present illness.   Otherwise, review of systems are positive for none.   All other systems are reviewed and negative.    PHYSICAL EXAM: VS:  BP 110/62   Pulse 82  Ht 5' 10.5" (1.791 m)   Wt 128 lb 9.6 oz (58.3 kg)   SpO2 98%   BMI 18.19 kg/m  , BMI Body mass index is 18.19 kg/m. GENERAL:  Well appearing HEENT: Pupils equal round and reactive, fundi not visualized, oral mucosa unremarkable NECK:  No jugular venous distention, waveform within normal limits, carotid upstroke brisk and symmetric, no bruits, no thyromegaly LUNGS:  Poor air movement.  No crackles, rhonchi or wheezes HEART:  RRR.  PMI not displaced or sustained,S1 and S2 within normal limits, no S3, no S4, no clicks, no rubs, no murmurs ABD:  Flat, positive bowel sounds normal in frequency in pitch, no bruits, no rebound, no guarding, no midline pulsatile mass, no hepatomegaly, no splenomegaly EXT:  2 plus pulses throughout, no edema, no cyanosis no clubbing SKIN:  No rashes no nodules NEURO:  Cranial nerves II through XII grossly intact, motor grossly intact throughout PSYCH:  Cognitively intact, oriented to person place and time  EKG:  EKG is ordered today. 04/04/18: Sinus rhythm.  Rate 99 bpm.  Right atrial enlargement.  Anterior T wave and normalities. 02/05/22: Sinus rhythm.  Rate 82 bpm.  Biatrial enlargement.  RAD.    Echo 03/09/2020: IMPRESSIONS   1. Severe RV failure.   2. Left ventricular ejection fraction, by estimation, is 60 to 65%. The  left ventricle has normal function. The left ventricle has no regional  wall motion abnormalities. Left ventricular diastolic parameters were  normal.   3. Right ventricular systolic  function is severely reduced. The right  ventricular size is severely enlarged. There is moderately elevated  pulmonary artery systolic pressure.   4. The mitral valve is normal in structure. Trivial mitral valve  regurgitation. No evidence of mitral stenosis.   5. Tricuspid valve regurgitation is mild to moderate.   6. The aortic valve is tricuspid. Aortic valve regurgitation is not  visualized. Mild aortic valve sclerosis is present, with no evidence of  aortic valve stenosis.   7. The inferior vena cava is dilated in size with <50% respiratory  variability, suggesting right atrial pressure of 15 mmHg.   Recent Labs: 09/28/2021: ALT 13; BUN 14; Creatinine, Ser 0.76; Hemoglobin 13.6; Platelets 217; Potassium 4.4; Sodium 140    Lipid Panel    Component Value Date/Time   CHOL 221 (H) 09/28/2021 1222   TRIG 87 09/28/2021 1222   HDL 85 09/28/2021 1222   CHOLHDL 2.6 09/28/2021 1222   LDLCALC 121 (H) 09/28/2021 1222      Wt Readings from Last 3 Encounters:  02/05/22 128 lb 9.6 oz (58.3 kg)  01/29/22 126 lb 9.6 oz (57.4 kg)  09/28/21 134 lb 6.4 oz (61 kg)      ASSESSMENT AND PLAN:  Atherosclerosis of aorta (HCC) Lipids are poorly controlled.  He notes that for a while he was not taking his rosuvastatin.  He has resumed it.  Repeat lipids and a CMP in 3 months.  Cor pulmonale (chronic) [facility]) Anthony Nelson has severe RV failure with WHO class III pulmonary hypertension.  Fortunately he has quit smoking.  He does not have any symptoms of heart failure and has barely needed to use his furosemide.  Will repeat his echocardiogram.  If his RV dysfunction is still severe, it may be worth having him see our advanced heart failure clinic to see if he is eligible for any additional therapies.  Continue furosemide as needed.  He was congratulated on continuing to abstain from smoking.  Pulmonary hypertension due  to chronic obstructive pulmonary disease (HCC) WHO Group 3 PAH.  Repeating echo and  considering HF clinic referral as above.   Current medicines are reviewed at length with the patient today.  The patient does not have concerns regarding medicines.  The following changes have been made:  no change  Labs/ tests ordered today include:   Orders Placed This Encounter  Procedures   Lipid panel   Comprehensive metabolic panel   EKG 84-FUWT   ECHOCARDIOGRAM COMPLETE     Disposition:   FU with Anthony Vassey C. Oval Linsey, MD, [facility] in 6 months.     Signed, Anthony Valbuena C. Oval Linsey, MD, [facility]  02/05/2022 2:01 PM    [facility]

## 2022-02-05 ENCOUNTER — Ambulatory Visit (INDEPENDENT_AMBULATORY_CARE_PROVIDER_SITE_OTHER): Payer: Medicare HMO | Admitting: Cardiovascular Disease

## 2022-02-05 ENCOUNTER — Encounter (HOSPITAL_BASED_OUTPATIENT_CLINIC_OR_DEPARTMENT_OTHER): Payer: Self-pay | Admitting: Cardiovascular Disease

## 2022-02-05 VITALS — BP 110/62 | HR 82 | Ht 70.5 in | Wt 128.6 lb

## 2022-02-05 DIAGNOSIS — I2723 Pulmonary hypertension due to lung diseases and hypoxia: Secondary | ICD-10-CM | POA: Diagnosis not present

## 2022-02-05 DIAGNOSIS — Z5181 Encounter for therapeutic drug level monitoring: Secondary | ICD-10-CM | POA: Diagnosis not present

## 2022-02-05 DIAGNOSIS — E785 Hyperlipidemia, unspecified: Secondary | ICD-10-CM

## 2022-02-05 DIAGNOSIS — I700 Atherosclerosis of aorta: Secondary | ICD-10-CM | POA: Diagnosis not present

## 2022-02-05 DIAGNOSIS — J449 Chronic obstructive pulmonary disease, unspecified: Secondary | ICD-10-CM | POA: Diagnosis not present

## 2022-02-05 DIAGNOSIS — I50812 Chronic right heart failure: Secondary | ICD-10-CM

## 2022-02-05 NOTE — Assessment & Plan Note (Signed)
WHO Group 3 PAH.  Repeating echo and considering HF clinic referral as above.

## 2022-02-05 NOTE — Assessment & Plan Note (Addendum)
Mr. Kuyper has severe RV failure with WHO class III pulmonary hypertension.  Fortunately he has quit smoking.  He does not have any symptoms of heart failure and has barely needed to use his furosemide.  Will repeat his echocardiogram.  If his RV dysfunction is still severe, it may be worth having him see our advanced heart failure clinic to see if he is eligible for any additional therapies.  Continue furosemide as needed.  He was congratulated on continuing to abstain from smoking.

## 2022-02-05 NOTE — Assessment & Plan Note (Signed)
Lipids are poorly controlled.  He notes that for a while he was not taking his rosuvastatin.  He has resumed it.  Repeat lipids and a CMP in 3 months.

## 2022-02-05 NOTE — Patient Instructions (Signed)
Medication Instructions:  Your physician recommends that you continue on your current medications as directed. Please refer to the Current Medication list given to you today.   *If you need a refill on your cardiac medications before your next appointment, please call your pharmacy*  Lab Work: FASTING LP/CMET IN ABOUT 3 MONTHS   Testing/Procedures: Your physician has requested that you have an echocardiogram. Echocardiography is a painless test that uses sound waves to create images of your heart. It provides your doctor with information about the size and shape of your heart and how well your heart's chambers and valves are working. This procedure takes approximately one hour. There are no restrictions for this procedure.  Follow-Up: At [facility], you and your health needs are our priority.  As part of our continuing mission to provide you with exceptional heart care, we have created designated Provider Care Teams.  These Care Teams include your primary Cardiologist (physician) and Advanced Practice Providers (APPs -  Physician Assistants and Nurse Practitioners) who all work together to provide you with the care you need, when you need it.  We recommend signing up for the patient portal called "MyChart".  Sign up information is provided on this After Visit Summary.  MyChart is used to connect with patients for Virtual Visits (Telemedicine).  Patients are able to view lab/test results, encounter notes, upcoming appointments, etc.  Non-urgent messages can be sent to your provider as well.   To learn more about what you can do with MyChart, go to [email or web address].    Your next appointment:   6 month(s)  The format for your next appointment:   In Person  Provider:   Skeet Latch, MD

## 2022-02-23 ENCOUNTER — Encounter (HOSPITAL_COMMUNITY): Payer: Self-pay

## 2022-02-23 NOTE — Telephone Encounter (Signed)
Pt called saying he thought a portable oxygen tanks or tanks was being ordered for him but he has not heard anything else about it.  He said he needs these so he can go out.  CB@  [phone]

## 2022-02-24 NOTE — Telephone Encounter (Signed)
Routing to case Freight forwarder

## 2022-02-28 NOTE — Addendum Note (Signed)
Addended by: Asencion Noble E on: 02/28/2022 12:32 PM   Modules accepted: Orders

## 2022-02-28 NOTE — Telephone Encounter (Signed)
Order for POC evaluation and titration faxed to [place]

## 2022-02-28 NOTE — Telephone Encounter (Signed)
Rx placed in system bringing to you

## 2022-03-09 ENCOUNTER — Other Ambulatory Visit (HOSPITAL_BASED_OUTPATIENT_CLINIC_OR_DEPARTMENT_OTHER): Payer: Medicare Other

## 2022-03-13 ENCOUNTER — Other Ambulatory Visit: Payer: Self-pay

## 2022-03-15 ENCOUNTER — Telehealth: Payer: Self-pay

## 2022-03-15 NOTE — Telephone Encounter (Signed)
I spoke to [facility] who stated that the order has been received for POC eval and they are in the process of scheduling the patient for an appointment. Nothing needed from PCP at this time

## 2022-03-19 ENCOUNTER — Telehealth (HOSPITAL_COMMUNITY): Payer: Self-pay

## 2022-03-19 NOTE — Telephone Encounter (Signed)
No response from pt regarding PR.   Closed referral.

## 2022-03-23 ENCOUNTER — Other Ambulatory Visit (HOSPITAL_BASED_OUTPATIENT_CLINIC_OR_DEPARTMENT_OTHER): Payer: Medicare Other

## 2022-04-05 ENCOUNTER — Other Ambulatory Visit (HOSPITAL_BASED_OUTPATIENT_CLINIC_OR_DEPARTMENT_OTHER): Payer: Medicare Other

## 2022-04-18 ENCOUNTER — Ambulatory Visit (INDEPENDENT_AMBULATORY_CARE_PROVIDER_SITE_OTHER): Payer: Medicare HMO

## 2022-04-18 DIAGNOSIS — I700 Atherosclerosis of aorta: Secondary | ICD-10-CM | POA: Diagnosis not present

## 2022-04-18 DIAGNOSIS — I50812 Chronic right heart failure: Secondary | ICD-10-CM

## 2022-04-18 LAB — ECHOCARDIOGRAM COMPLETE
Area-P 1/2: 3.99 cm2
S' Lateral: 2.23 cm

## 2022-05-14 ENCOUNTER — Other Ambulatory Visit: Payer: Self-pay

## 2022-05-15 ENCOUNTER — Other Ambulatory Visit: Payer: Self-pay

## 2022-06-04 NOTE — Progress Notes (Unsigned)
Annual Wellness Visit     Patient: Anthony Nelson, Male    DOB: 28-Feb-1961, 61 y.o.   MRN: [id number]  Subjective  No chief complaint on file.   [name] is a 61 y.o. male who presents today for his Annual Wellness Visit. He reports consuming a {diet types:17450} diet. {Exercise:19826} He generally feels {well/fairly well/poorly:18703}. He reports sleeping {well/fairly well/poorly:18703}. He {does/does not:200015} have additional problems to discuss today.   Expand All Collapse All         HPI 05/02/21 [name] presents for primary care visit.  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  He just saw cardiology in July.  He is on chronic 2 L of oxygen home oxygen and is on low-dose furosemide and BiPAP at night.  He also maintains his inhalers and is no longer smoking cigarettes for a year now.   Patient's biggest complaint is that of chronic shoulder pain on the right.  He played sports when he was younger.  He is uncertain whether he tore her rotator cuff or not but is been an issue for about 2 years.  He is not able to abduct the shoulder above the plane of the collarbone.  He has pain anteriorly in the shoulder as well.  He uses over-the-counter medications for this.   Patient needs a repeat lipid panel has not had one since October 2021 goal is LDL less than 70   Patient agrees to and did receive both the Prevnar 20 vaccine and flu shot   09/28/21 Patient seen today for primary care follow-up.  He has severe COPD Gold stage IV and is on BiPAP at night with 2 L oxygen he takes 2 L during the day as well.  He does need a new nebulizer machine.  Unfortunately the patient has lost his housing.  He now lives in a motel.  Patient states his breathing is at baseline. Patient states his shoulder is no longer hurting. Mental health is stable.   7/31 Since the last visit the patient has had improvement in  that he now lives with a servant house he has meals there.  He maintains oxygen 2 L but would like to have a light weight portable oxygen concentrator.  He also would be a candidate for pulmonary rehab.  He has minimal productive cough his dyspnea is at baseline.  On arrival blood pressure is 110/69.  He has been to see his primary care provider in the [place] system but most of his care is here privately     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 ([place])       Chronic obstructive lung disease with pulmonary hypertension stable at this time oxygenation is stable he would benefit from pulmonary rehab referral has been made no change in inhaled medication      Relevant Medications   rosuvastatin (CRESTOR) 40 MG tablet   Chronic diastolic heart failure (HCC)      Compensated diastolic heart failure      Relevant Medications   rosuvastatin (CRESTOR) 40 MG tablet   Atherosclerosis of aorta (HCC)   Relevant Medications   rosuvastatin (CRESTOR) 40 MG tablet      Respiratory   COPD GOLD IV       InhalerContinue stability with Gold stage IV COPD continue medications      Relevant Medications   albuterol (VENTOLIN HFA) 108 (90 Base) MCG/ACT inhaler   Other  Relevant Orders   AMB referral to pulmonary rehabilitation   For home use only DME oxygen   Chronic respiratory failure with hypoxia and hypercapnia (HCC)      We will assess patient for pulmonary rehab and will also she can get a portable oxygen concentrator at 2 L      Relevant Orders   AMB referral to pulmonary rehabilitation   For home use only DME oxygen      Other   Hyperlipidemia      Continue with statin therapy      Relevant Medications   rosuvastatin (CRESTOR) 40 MG tablet   RESOLVED: Living in motel      Now is at the [place] now is housed      Other Visit Diagnoses      Colon cancer screening    -  Primary   Relevant Orders   Fecal occult blood,  imunochemical      {VISON DENTAL STD PSA (Optional):27386}   {History (Optional):23778}  Medications: Outpatient Medications Prior to Visit  Medication Sig   albuterol (PROVENTIL) (2.5 MG/3ML) 0.083% nebulizer solution TAKE 3 MLS (2.5 MG TOTAL) BY NEBULIZATION EVERY 6 (SIX) HOURS AS NEEDED FOR WHEEZING OR SHORTNESS OF BREATH.   albuterol (VENTOLIN HFA) 108 (90 Base) MCG/ACT inhaler Inhale 2 puffs into the lungs every 6 (six) hours as needed for wheezing or shortness of breath.   Ascorbic Acid (VITAMIN C) 100 MG tablet Take 1 tablet (100 mg total) by mouth daily.   aspirin EC 81 MG tablet Take 1 tablet (81 mg total) by mouth daily. Swallow whole.   Cholecalciferol (VITAMIN D3) 50 MCG (2000 UT) TABS Take 2,000 Units by mouth daily.   Cyanocobalamin (B-12 PO) Take 1 tablet by mouth daily.   dextromethorphan-guaiFENesin (MUCINEX DM) 30-600 MG 12hr tablet Take 1 tablet by mouth 2 (two) times daily as needed for cough.   docusate sodium (COLACE) 100 MG capsule Take 1 capsule (100 mg total) by mouth daily.   famotidine (PEPCID) 20 MG tablet TAKE 1 TABLET (20 MG TOTAL) BY MOUTH 2 (TWO) TIMES DAILY.   furosemide (LASIX) 20 MG tablet Take 1 tablet (20 mg total) by mouth daily as needed.   Glycopyrrolate-Formoterol (BEVESPI AEROSPHERE) 9-4.8 MCG/ACT AERO Inhale 1 puff into the lungs in the morning and at bedtime.   Multiple Vitamins-Minerals (ZINC PO) Take 1 tablet by mouth daily.   OXYGEN Inhale 2 L/min into the lungs continuous.    potassium chloride SA (KLOR-CON M) 20 MEQ tablet Take 1 tablet (20 mEq total) by mouth daily as needed.   rosuvastatin (CRESTOR) 40 MG tablet Take 1 tablet (40 mg total) by mouth daily.   sodium chloride (OCEAN) 0.65 % SOLN nasal spray Place 2 sprays into both nostrils in the morning, at noon, and at bedtime.   No facility-administered medications prior to visit.    Allergies  Allergen Reactions   Erythromycin Other (See Comments)    Allergy from childhood- does  not take it    Patient Care Team: Elsie Stain, MD as PCP - General (Pulmonary Disease) Skeet Latch, MD as Attending Physician (Cardiology)  ROS      Objective  There were no vitals taken for this visit. {Vitals History (Optional):23777}  Physical Exam    Most recent functional status assessment:     No data to display         Most recent fall risk assessment:    01/29/2022    3:48 PM  Fall Risk   Falls in the past year? 0  Number falls in past yr: 0  Injury with Fall? 0  Risk for fall due to : No Fall Risks    Most recent depression screenings:    01/29/2022    3:48 PM 09/28/2021   11:17 AM  PHQ 2/9 Scores  PHQ - 2 Score 0 0  PHQ- 9 Score  0   Most recent cognitive screening:     No data to display         Most recent Audit-C alcohol use screening     No data to display         A score of 3 or more in women, and 4 or more in men indicates increased risk for alcohol abuse, EXCEPT if all of the points are from question 1   Vision/Hearing Screen: No results found.  {Labs (Optional):23779}  No results found for any visits on 06/05/22.    Assessment & Plan   Annual wellness visit done today including the all of the following: Reviewed patient's Family Medical History Reviewed and updated list of patient's medical providers Assessment of cognitive impairment was done Assessed patient's functional ability Established a written schedule for health screening [place] Completed and Reviewed  Exercise Activities and Dietary recommendations  Goals   None     Immunization History  Administered Date(s) Administered   Influenza,inj,Quad PF,6+ Mos 02/18/2018, 03/25/2020, 05/02/2021   Moderna Sars-Covid-2 Vaccination 04/06/2020, 05/04/2020   PNEUMOCOCCAL CONJUGATE-20 05/02/2021   Tdap 06/23/2019, 06/24/2019   Zoster Recombinat (Shingrix) 05/10/2020, 12/05/2020    Health Maintenance  Topic Date Due    Medicare Annual Wellness (AWV)  Never done   COVID-19 Vaccine (3 - Moderna risk series) 06/01/2020   COLON CANCER SCREENING ANNUAL FOBT  08/23/2020   INFLUENZA VACCINE  01/30/2022   DTaP/Tdap/Td (3 - Td or Tdap) 06/23/2029   Hepatitis C Screening  Completed   HIV Screening  Completed   Zoster Vaccines- Shingrix  Completed   HPV VACCINES  Aged Out   COLONOSCOPY (Pts 45-27yr Insurance coverage will need to be confirmed)  Discontinued     Discussed health benefits of physical activity, and encouraged him to engage in regular exercise appropriate for his age and condition.    Problem List Items Addressed This Visit   None   No follow-ups on file.     PAsencion Noble MD

## 2022-06-05 ENCOUNTER — Other Ambulatory Visit: Payer: Self-pay

## 2022-06-05 ENCOUNTER — Encounter: Payer: Self-pay | Admitting: Critical Care Medicine

## 2022-06-05 ENCOUNTER — Ambulatory Visit: Payer: Medicare HMO | Attending: Critical Care Medicine | Admitting: Critical Care Medicine

## 2022-06-05 VITALS — BP 116/73 | HR 89 | Wt 130.2 lb

## 2022-06-05 DIAGNOSIS — Z1211 Encounter for screening for malignant neoplasm of colon: Secondary | ICD-10-CM

## 2022-06-05 DIAGNOSIS — Z23 Encounter for immunization: Secondary | ICD-10-CM

## 2022-06-05 DIAGNOSIS — J9612 Chronic respiratory failure with hypercapnia: Secondary | ICD-10-CM

## 2022-06-05 DIAGNOSIS — E782 Mixed hyperlipidemia: Secondary | ICD-10-CM

## 2022-06-05 DIAGNOSIS — I2723 Pulmonary hypertension due to lung diseases and hypoxia: Secondary | ICD-10-CM | POA: Diagnosis not present

## 2022-06-05 DIAGNOSIS — J9611 Chronic respiratory failure with hypoxia: Secondary | ICD-10-CM | POA: Diagnosis not present

## 2022-06-05 DIAGNOSIS — J449 Chronic obstructive pulmonary disease, unspecified: Secondary | ICD-10-CM | POA: Diagnosis not present

## 2022-06-05 DIAGNOSIS — R634 Abnormal weight loss: Secondary | ICD-10-CM

## 2022-06-05 MED ORDER — ALBUTEROL SULFATE HFA 108 (90 BASE) MCG/ACT IN AERS
2.0000 | INHALATION_SPRAY | Freq: Four times a day (QID) | RESPIRATORY_TRACT | 2 refills | Status: DC | PRN
  Filled 2022-06-05 – 2022-07-17 (×2): qty 18, 25d supply, fill #0
  Filled 2022-09-17: qty 18, 25d supply, fill #1
  Filled 2022-11-19: qty 18, 25d supply, fill #0
  Filled 2022-11-19: qty 18, 25d supply, fill #2

## 2022-06-05 MED ORDER — FAMOTIDINE 20 MG PO TABS
ORAL_TABLET | Freq: Two times a day (BID) | ORAL | 3 refills | Status: DC
  Filled 2022-06-05: qty 60, 30d supply, fill #0

## 2022-06-05 MED ORDER — ROSUVASTATIN CALCIUM 40 MG PO TABS
40.0000 mg | ORAL_TABLET | Freq: Every day | ORAL | 4 refills | Status: DC
  Filled 2022-06-05: qty 30, 30d supply, fill #0
  Filled 2022-09-17 – 2022-11-19 (×2): qty 60, 60d supply, fill #0
  Filled 2022-11-19 – 2023-04-01 (×2): qty 60, 60d supply, fill #1
  Filled 2023-06-04: qty 60, 60d supply, fill #2

## 2022-06-05 MED ORDER — ALBUTEROL SULFATE (2.5 MG/3ML) 0.083% IN NEBU
3.0000 mL | INHALATION_SOLUTION | Freq: Four times a day (QID) | RESPIRATORY_TRACT | 2 refills | Status: DC | PRN
  Filled 2022-06-05 – 2022-11-19 (×3): qty 90, 8d supply, fill #0
  Filled 2023-05-15: qty 90, 8d supply, fill #1
  Filled 2023-06-04: qty 90, 8d supply, fill #2

## 2022-06-05 MED ORDER — BEVESPI AEROSPHERE 9-4.8 MCG/ACT IN AERO
INHALATION_SPRAY | Freq: Two times a day (BID) | RESPIRATORY_TRACT | 6 refills | Status: DC
  Filled 2022-06-05: qty 10.7, 30d supply, fill #0
  Filled 2022-06-06 – 2022-06-08 (×2): qty 10.7, 60d supply, fill #0
  Filled 2022-07-17: qty 10.7, 30d supply, fill #0
  Filled 2022-09-17: qty 10.7, 30d supply, fill #1
  Filled 2022-11-19: qty 10.7, 30d supply, fill #0
  Filled 2022-11-19: qty 10.7, 30d supply, fill #2
  Filled 2023-01-15: qty 10.7, 30d supply, fill #1
  Filled 2023-04-01: qty 10.7, 30d supply, fill #2

## 2022-06-05 NOTE — Assessment & Plan Note (Signed)
Severe COPD  stage IV with chronic hypoxic respiratory failure  Will see if we can get him a portable oxygen concentrator

## 2022-06-05 NOTE — Progress Notes (Signed)
Established Patient Office Visit  Subjective:  Patient ID: Anthony Nelson, male    DOB: 09-Sep-1960  Age: 61 y.o. MRN: [id number]  CC: Primary care follow-up   HPI 05/02/21 [name] presents for primary care visit.  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  He just saw cardiology in July.  He is on chronic 2 L of oxygen home oxygen and is on low-dose furosemide and BiPAP at night.  He also maintains his inhalers and is no longer smoking cigarettes for a year now.  Patient's biggest complaint is that of chronic shoulder pain on the right.  He played sports when he was younger.  He is uncertain whether he tore her rotator cuff or not but is been an issue for about 2 years.  He is not able to abduct the shoulder above the plane of the collarbone.  He has pain anteriorly in the shoulder as well.  He uses over-the-counter medications for this.  Patient needs a repeat lipid panel has not had one since October 2021 goal is LDL less than 70  Patient agrees to and did receive both the Prevnar 20 vaccine and flu shot  09/28/21 Patient seen today for primary care follow-up.  He has severe COPD Gold stage IV and is on BiPAP at night with 2 L oxygen he takes 2 L during the day as well.  He does need a new nebulizer machine.  Unfortunately the patient has lost his housing.  He now lives in a motel.  Patient states his breathing is at baseline. Patient states his shoulder is no longer hurting. Mental health is stable.  7/31 Since the last visit the patient has had improvement in that he now lives with a servant house he has meals there.  He maintains oxygen 2 L but would like to have a light weight portable oxygen concentrator.  He also would be a candidate for pulmonary rehab.  He has minimal productive cough his dyspnea is at baseline.  On arrival blood pressure is 110/69.  He has been to see his primary care provider in the [place]  system but most of his care is here privately  06/05/22 Patient seen in return follow-up he maintains 2 L of oxygen COPD is stable at this time he is interested in getting a portable oxygen concentrator he has a cylinders at this time.  He stays at [facility] and doing well with this he also maintains a relationship with a primary care doctor in the [place] system.  Medications have not changed.  There are no new complaints.  His BMI has increased with nutrition.  Blood pressure on arrival is good 116/73 he needs to process another fecal occult kit the last 1 did not process  Past Medical History:  Diagnosis Date   Atherosclerosis of aorta ([place]) 04/06/2020   Bronchitis    COPD (chronic obstructive pulmonary disease) (HCC)    Diastolic heart failure (HCC)    GERD (gastroesophageal reflux disease)    Pulmonary hypertension (HCC)    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 ([place]) 04/10/2018   Respiratory failure with hypoxia ([place])     Past Surgical History:  Procedure Laterality Date   HERNIA REPAIR      Family History  Problem Relation Age of Onset   Cancer Mother    Cancer Father    CAD Father    Heart failure Father    Diabetes  Maternal Grandmother    Kidney disease Maternal Grandmother    Kidney cancer Paternal Grandmother    Heart attack Paternal Grandfather     Social History   Socioeconomic History   Marital status: Married    Spouse name: [name]   Number of children: 3   Years of education: 16   Highest education level: Bachelor's degree (e.g., BA, AB, BS)  Occupational History   Occupation: Presenter, broadcasting  Tobacco Use   Smoking status: Former    Packs/day: 0.10    Years: 44.00    Total pack years: 4.40    Types: Cigarettes    Quit date: 07/20/2020    Years since quitting: 1.8   Smokeless tobacco: Never  Vaping Use   Vaping Use: Never used  Substance and Sexual Activity   Alcohol use: Not Currently    Comment: Socially     Drug use: No   Sexual activity: Not Currently  Other Topics Concern   Not on file  Social History Narrative   Not on file   Social Determinants of Health   Financial Resource Strain: Not on file  Food Insecurity: Not on file  Transportation Needs: Not on file  Physical Activity: Not on file  Stress: Not on file  Social Connections: Not on file  Intimate Partner Violence: Not on file    Outpatient Medications Prior to Visit  Medication Sig Dispense Refill   Ascorbic Acid (VITAMIN C) 100 MG tablet Take 1 tablet (100 mg total) by mouth daily. 60 tablet 2   aspirin EC 81 MG tablet Take 1 tablet (81 mg total) by mouth daily. Swallow whole. 80 tablet 4   Cholecalciferol (VITAMIN D3) 50 MCG (2000 UT) TABS Take 2,000 Units by mouth daily. 30 tablet 3   Cyanocobalamin (B-12 PO) Take 1 tablet by mouth daily.     dextromethorphan-guaiFENesin (MUCINEX DM) 30-600 MG 12hr tablet Take 1 tablet by mouth 2 (two) times daily as needed for cough. 60 tablet 4   docusate sodium (COLACE) 100 MG capsule Take 1 capsule (100 mg total) by mouth daily. 10 capsule 0   furosemide (LASIX) 20 MG tablet Take 1 tablet (20 mg total) by mouth daily as needed. 30 tablet 2   Multiple Vitamins-Minerals (ZINC PO) Take 1 tablet by mouth daily.     OXYGEN Inhale 2 L/min into the lungs continuous.      potassium chloride SA (KLOR-CON M) 20 MEQ tablet Take 1 tablet (20 mEq total) by mouth daily as needed. 30 tablet 3   sodium chloride (OCEAN) 0.65 % SOLN nasal spray Place 2 sprays into both nostrils in the morning, at noon, and at bedtime. 104 mL 3   albuterol (PROVENTIL) (2.5 MG/3ML) 0.083% nebulizer solution TAKE 3 MLS (2.5 MG TOTAL) BY NEBULIZATION EVERY 6 (SIX) HOURS AS NEEDED FOR WHEEZING OR SHORTNESS OF BREATH. 75 mL 2   albuterol (VENTOLIN HFA) 108 (90 Base) MCG/ACT inhaler Inhale 2 puffs into the lungs every 6 (six) hours as needed for wheezing or shortness of breath. 18 g 2   famotidine (PEPCID) 20 MG tablet TAKE 1  TABLET (20 MG TOTAL) BY MOUTH 2 (TWO) TIMES DAILY. 60 tablet 3   Glycopyrrolate-Formoterol (BEVESPI AEROSPHERE) 9-4.8 MCG/ACT AERO Inhale 1 puff into the lungs in the morning and at bedtime. 10.7 g 6   rosuvastatin (CRESTOR) 40 MG tablet Take 1 tablet (40 mg total) by mouth daily. 60 tablet 4   No facility-administered medications prior to visit.  Allergies  Allergen Reactions   Erythromycin Other (See Comments)    Allergy from childhood- does not take it    ROS Review of Systems  Constitutional: Negative.   HENT: Negative.  Negative for ear pain, postnasal drip, rhinorrhea, sinus pressure, sore throat, trouble swallowing and voice change.   Eyes: Negative.   Respiratory:  Positive for shortness of breath. Negative for apnea, cough, choking, chest tightness, wheezing and stridor.        Mucus is clear   Cardiovascular: Negative.  Negative for chest pain, palpitations and leg swelling.  Gastrointestinal: Negative.  Negative for abdominal distention, abdominal pain, nausea and vomiting.  Genitourinary: Negative.   Musculoskeletal: Negative.  Negative for arthralgias and myalgias.  Skin: Negative.  Negative for rash.  Allergic/Immunologic: Negative.  Negative for environmental allergies and food allergies.  Neurological: Negative.  Negative for dizziness, syncope, weakness and headaches.  Hematological: Negative.  Negative for adenopathy. Does not bruise/bleed easily.  Psychiatric/Behavioral: Negative.  Negative for agitation, sleep disturbance and suicidal ideas. The patient is not nervous/anxious.       Objective:    Physical Exam Vitals reviewed.  Constitutional:      Appearance: Normal appearance. He is well-developed. He is not diaphoretic.     Comments: Thin  HENT:     Head: Normocephalic and atraumatic.     Nose: No nasal deformity, septal deviation, mucosal edema or rhinorrhea.     Right Sinus: No maxillary sinus tenderness or frontal sinus tenderness.     Left  Sinus: No maxillary sinus tenderness or frontal sinus tenderness.     Mouth/Throat:     Pharynx: No oropharyngeal exudate.  Eyes:     General: No scleral icterus.    Conjunctiva/sclera: Conjunctivae normal.     Pupils: Pupils are equal, round, and reactive to light.  Neck:     Thyroid: No thyromegaly.     Vascular: No carotid bruit or JVD.     Trachea: Trachea normal. No tracheal tenderness or tracheal deviation.  Cardiovascular:     Rate and Rhythm: Normal rate and regular rhythm.     Chest Wall: PMI is not displaced.     Pulses: Normal pulses. No decreased pulses.     Heart sounds: Normal heart sounds, S1 normal and S2 normal. Heart sounds not distant. No murmur heard.    No systolic murmur is present.     No diastolic murmur is present.     No friction rub. No gallop. No S3 or S4 sounds.  Pulmonary:     Effort: Pulmonary effort is normal. No tachypnea, accessory muscle usage or respiratory distress.     Breath sounds: No stridor. No decreased breath sounds, wheezing, rhonchi or rales.     Comments: Distant breath sounds Chest:     Chest wall: No tenderness.  Abdominal:     General: Bowel sounds are normal. There is no distension.     Palpations: Abdomen is soft. Abdomen is not rigid.     Tenderness: There is no abdominal tenderness. There is no guarding or rebound.  Musculoskeletal:        General: Normal range of motion.     Cervical back: Normal range of motion and neck supple. No edema, erythema or rigidity. No muscular tenderness. Normal range of motion.  Lymphadenopathy:     Head:     Right side of head: No submental or submandibular adenopathy.     Left side of head: No submental or submandibular adenopathy.  Cervical: No cervical adenopathy.  Skin:    General: Skin is warm and dry.     Coloration: Skin is not pale.     Findings: No rash.     Nails: There is no clubbing.  Neurological:     Mental Status: He is alert and oriented to person, place, and time.      Sensory: No sensory deficit.  Psychiatric:        Mood and Affect: Mood normal.        Speech: Speech normal.        Behavior: Behavior normal.     BP 116/73   Pulse 89   Wt 130 lb 3.2 oz (59.1 kg)   SpO2 90%   BMI 18.42 kg/m  Wt Readings from Last 3 Encounters:  06/05/22 130 lb 3.2 oz (59.1 kg)  02/05/22 128 lb 9.6 oz (58.3 kg)  01/29/22 126 lb 9.6 oz (57.4 kg)     Health Maintenance Due  Topic Date Due   Medicare Annual Wellness (AWV)  Never done   COVID-19 Vaccine (3 - Moderna risk series) 06/01/2020   COLON CANCER SCREENING ANNUAL FOBT  08/23/2020    There are no preventive care reminders to display for this patient.  Lab Results  Component Value Date   TSH 1.757 04/25/2019   Lab Results  Component Value Date   WBC 6.2 09/28/2021   HGB 13.6 09/28/2021   HCT 42.8 09/28/2021   MCV 94 09/28/2021   PLT 217 09/28/2021   Lab Results  Component Value Date   NA 140 09/28/2021   K 4.4 09/28/2021   CO2 35 (H) 09/28/2021   GLUCOSE 88 09/28/2021   BUN 14 09/28/2021   CREATININE 0.76 09/28/2021   BILITOT 0.4 09/28/2021   ALKPHOS 72 09/28/2021   AST 20 09/28/2021   ALT 13 09/28/2021   PROT 7.4 09/28/2021   ALBUMIN 4.6 09/28/2021   CALCIUM 9.4 09/28/2021   ANIONGAP 10 09/07/2020   EGFR 102 09/28/2021   Lab Results  Component Value Date   CHOL 221 (H) 09/28/2021   Lab Results  Component Value Date   HDL 85 09/28/2021   Lab Results  Component Value Date   LDLCALC 121 (H) 09/28/2021   Lab Results  Component Value Date   TRIG 87 09/28/2021   Lab Results  Component Value Date   CHOLHDL 2.6 09/28/2021   Lab Results  Component Value Date   HGBA1C 5.9 (H) 03/08/2020      Assessment & Plan:   Problem List Items Addressed This Visit       Cardiovascular and Mediastinum   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)   Relevant Medications   rosuvastatin (CRESTOR) 40 MG tablet   Other Relevant Orders   BMP8+eGFR    For home use only DME oxygen     Respiratory   COPD GOLD IV     Severe COPD  stage IV with chronic hypoxic respiratory failure  Will see if we can get him a portable oxygen concentrator      Relevant Medications   albuterol (PROVENTIL) (2.5 MG/3ML) 0.083% nebulizer solution   albuterol (VENTOLIN HFA) 108 (90 Base) MCG/ACT inhaler   Glycopyrrolate-Formoterol (BEVESPI AEROSPHERE) 9-4.8 MCG/ACT AERO   Other Relevant Orders   Fecal occult blood, imunochemical   Chronic respiratory failure with hypoxia and hypercapnia (HCC)    Oxygen saturations on 2-1/2 L maintained above 93% with ambulation and at rest I believe he would tolerate a  2 L oxygen portable concentrator if he can qualify      Relevant Orders   Fecal occult blood, imunochemical     Other   Weight loss    Weight loss is stabilizing      Hyperlipidemia - Primary    Reassess lipid panel      Relevant Medications   rosuvastatin (CRESTOR) 40 MG tablet   Other Relevant Orders   Lipid panel   Other Visit Diagnoses     Colon cancer screening       Relevant Orders   Fecal occult blood, imunochemical   Need for immunization against influenza       Relevant Orders   Flu Vaccine QUAD High Dose(Fluad) (Completed)      Meds ordered this encounter  Medications   albuterol (PROVENTIL) (2.5 MG/3ML) 0.083% nebulizer solution    Sig: TAKE 3 MLS (2.5 MG TOTAL) BY NEBULIZATION EVERY 6 (SIX) HOURS AS NEEDED FOR WHEEZING OR SHORTNESS OF BREATH.    Dispense:  90 mL    Refill:  2   albuterol (VENTOLIN HFA) 108 (90 Base) MCG/ACT inhaler    Sig: Inhale 2 puffs into the lungs every 6 (six) hours as needed for wheezing or shortness of breath.    Dispense:  18 g    Refill:  2   famotidine (PEPCID) 20 MG tablet    Sig: TAKE 1 TABLET (20 MG TOTAL) BY MOUTH 2 (TWO) TIMES DAILY.    Dispense:  60 tablet    Refill:  3   Glycopyrrolate-Formoterol (BEVESPI AEROSPHERE) 9-4.8 MCG/ACT AERO    Sig: Inhale 1 puff into the lungs in the  morning and at bedtime.    Dispense:  10.7 g    Refill:  6   rosuvastatin (CRESTOR) 40 MG tablet    Sig: Take 1 tablet (40 mg total) by mouth daily.    Dispense:  60 tablet    Refill:  4  Flu vaccine given Colon cancer screening with fecal occult kit Follow-up: Return in about 4 months (around 10/05/2022) for htn, copd.    Asencion Noble, MD

## 2022-06-05 NOTE — Assessment & Plan Note (Signed)
Weight loss is stabilizing

## 2022-06-05 NOTE — Assessment & Plan Note (Signed)
Oxygen saturations on 2-1/2 L maintained above 93% with ambulation and at rest I believe he would tolerate a 2 L oxygen portable concentrator if he can qualify

## 2022-06-05 NOTE — Assessment & Plan Note (Signed)
Reassess lipid panel

## 2022-06-05 NOTE — Patient Instructions (Signed)
Fecal occult kit given for colon cancer screen  Labs : metabolic panel and cholesterol panel  Will ask to see if you can qualify for Portable oxygen concentrator with [employer]   All medications refilled  Return Dr [name] 4 months

## 2022-06-06 ENCOUNTER — Telehealth: Payer: Self-pay

## 2022-06-06 ENCOUNTER — Other Ambulatory Visit: Payer: Self-pay

## 2022-06-06 ENCOUNTER — Other Ambulatory Visit: Payer: Self-pay | Admitting: Critical Care Medicine

## 2022-06-06 LAB — LIPID PANEL
Chol/HDL Ratio: 2.5 ratio (ref 0.0–5.0)
Cholesterol, Total: 223 mg/dL — ABNORMAL HIGH (ref 100–199)
HDL: 90 mg/dL (ref >39)
LDL Chol Calc (NIH): 119 mg/dL — ABNORMAL HIGH (ref 0–99)
Triglycerides: 81 mg/dL (ref 0–149)
VLDL Cholesterol Cal: 14 mg/dL (ref 5–40)

## 2022-06-06 LAB — BMP8+EGFR
BUN/Creatinine Ratio: 17 (ref 10–24)
BUN: 14 mg/dL (ref 8–27)
CO2: 34 mmol/L — ABNORMAL HIGH (ref 20–29)
Calcium: 9.6 mg/dL (ref 8.6–10.2)
Chloride: 97 mmol/L (ref 96–106)
Creatinine, Ser: 0.81 mg/dL (ref 0.76–1.27)
Glucose: 65 mg/dL — ABNORMAL LOW (ref 70–99)
Potassium: 4.6 mmol/L (ref 3.5–5.2)
Sodium: 146 mmol/L — ABNORMAL HIGH (ref 134–144)
eGFR: 100 mL/min/{1.73_m2} (ref >59)

## 2022-06-06 MED ORDER — EZETIMIBE 10 MG PO TABS
10.0000 mg | ORAL_TABLET | Freq: Every day | ORAL | 3 refills | Status: DC
  Filled 2022-06-06: qty 90, 90d supply, fill #0

## 2022-06-06 NOTE — Telephone Encounter (Signed)
Pt was called and vm was left, Information has been sent to nurse pool.

## 2022-06-06 NOTE — Telephone Encounter (Signed)
-----  Message from Elsie Stain, MD sent at 06/06/2022  8:13 AM EST ----- Let pt know labs ok except cholesterol still too high I added zetia to take daily to rosuvastatin

## 2022-06-06 NOTE — Progress Notes (Signed)
Let pt know labs ok except cholesterol still too high I added zetia to take daily to rosuvastatin

## 2022-06-07 ENCOUNTER — Other Ambulatory Visit: Payer: Self-pay

## 2022-06-07 ENCOUNTER — Telehealth: Payer: Self-pay

## 2022-06-07 NOTE — Telephone Encounter (Signed)
Order for POC eval and titration faxed to [facility]

## 2022-06-07 NOTE — Telephone Encounter (Signed)
I spoke to [facility] regarding a new order for POC.  I explained that an order for a POC was sent to [facility] 03/01/2022.  She said that the patient had been scheduled for a POC eval but did not show up and the order was voided.   I new order will need to be placed and I gave [facility]'s standard POC eval order form to Carly.

## 2022-06-07 NOTE — Telephone Encounter (Signed)
noted 

## 2022-06-08 ENCOUNTER — Other Ambulatory Visit: Payer: Self-pay

## 2022-06-11 ENCOUNTER — Other Ambulatory Visit: Payer: Self-pay

## 2022-06-12 ENCOUNTER — Other Ambulatory Visit: Payer: Self-pay

## 2022-06-14 ENCOUNTER — Other Ambulatory Visit: Payer: Self-pay

## 2022-07-13 ENCOUNTER — Other Ambulatory Visit: Payer: Self-pay

## 2022-07-17 ENCOUNTER — Other Ambulatory Visit: Payer: Self-pay

## 2022-07-31 NOTE — Telephone Encounter (Signed)
Patient requesting PCP nurse please send in new orders to [facility] for his POC. Patient would like a follow up call when completed.   Also patient requesting portable oxygen orders, please send to "[facility]".  Patient would like a follow up call.

## 2022-08-02 NOTE — Telephone Encounter (Signed)
I called the patient to clarify the request for POC. He said he has been playing phone tag with [facility] about scheduling the POC eval and he did not know if a new order is needed.  I explained that we sent an order to [facility] about a month ago and I instructed him to call to schedule the POC eval and if they inform him that a new order is needed, I requested that he call us back and let us know.   He did not want the order sent to [facility], he would like a POC like the [facility] that is seen on tv.  He also said that he has difficulty arranging transportation to the [place] office in [facility].  I instructed him to call DSS/ Medicaid as well as [employer] because they both provide rides to medical appointments and they may consider this a medical appointment. I provided him with the phone number for [facility]

## 2022-08-03 NOTE — Telephone Encounter (Signed)
Noted  

## 2022-09-17 ENCOUNTER — Other Ambulatory Visit: Payer: Self-pay

## 2022-09-18 ENCOUNTER — Other Ambulatory Visit (HOSPITAL_COMMUNITY): Payer: Self-pay

## 2022-09-24 ENCOUNTER — Other Ambulatory Visit: Payer: Self-pay

## 2022-10-07 NOTE — Progress Notes (Deleted)
Established Patient Office Visit  Subjective:  Patient ID: Anthony Nelson, male    DOB: 1961-02-11  Age: 62 y.o. MRN: [id number]  CC: Primary care follow-up   HPI 05/02/21 Anthony Nelson presents for primary care visit.  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  He just saw cardiology in July.  He is on chronic 2 L of oxygen home oxygen and is on low-dose furosemide and BiPAP at night.  He also maintains his inhalers and is no longer smoking cigarettes for a year now.  Patient's biggest complaint is that of chronic shoulder pain on the right.  He played sports when he was younger.  He is uncertain whether he tore her rotator cuff or not but is been an issue for about 2 years.  He is not able to abduct the shoulder above the plane of the collarbone.  He has pain anteriorly in the shoulder as well.  He uses over-the-counter medications for this.  Patient needs a repeat lipid panel has not had one since October 2021 goal is LDL less than 70  Patient agrees to and did receive both the Prevnar 20 vaccine and flu shot  09/28/21 Patient seen today for primary care follow-up.  He has severe COPD Gold stage IV and is on BiPAP at night with 2 L oxygen he takes 2 L during the day as well.  He does need a new nebulizer machine.  Unfortunately the patient has lost his housing.  He now lives in a motel.  Patient states his breathing is at baseline. Patient states his shoulder is no longer hurting. Mental health is stable.  7/31 Since the last visit the patient has had improvement in that he now lives with a servant house he has meals there.  He maintains oxygen 2 L but would like to have a light weight portable oxygen concentrator.  He also would be a candidate for pulmonary rehab.  He has minimal productive cough his dyspnea is at baseline.  On arrival blood pressure is 110/69.  He has been to see his primary care provider in the [place]  system but most of his care is here privately  06/05/22 Patient seen in return follow-up he maintains 2 L of oxygen COPD is stable at this time he is interested in getting a portable oxygen concentrator he has a cylinders at this time.  He stays at [facility] and doing well with this he also maintains a relationship with a primary care doctor in the [place] system.  Medications have not changed.  There are no new complaints.  His BMI has increased with nutrition.  Blood pressure on arrival is good 116/73 he needs to process another fecal occult kit the last 1 did not process  10/09/22  Past Medical History:  Diagnosis Date   Atherosclerosis of aorta (HCC) 04/06/2020   Bronchitis    COPD (chronic obstructive pulmonary disease) (HCC)    Diastolic heart failure (HCC)    GERD (gastroesophageal reflux disease)    Pulmonary hypertension (HCC)    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   Respiratory failure with hypoxia (HCC)     Past Surgical History:  Procedure Laterality Date   HERNIA REPAIR      Family History  Problem Relation Age of Onset   Cancer Mother    Cancer Father    CAD Father    Heart failure Father  Diabetes Maternal Grandmother    Kidney disease Maternal Grandmother    Kidney cancer Paternal Grandmother    Heart attack Paternal Grandfather     Social History   Socioeconomic History   Marital status: Married    Spouse name: Anthony Nelson   Number of children: 3   Years of education: 16   Highest education level: Bachelor's degree (e.g., BA, AB, BS)  Occupational History   Occupation: Electrical engineer  Tobacco Use   Smoking status: Former    Packs/day: 0.10    Years: 44.00    Additional pack years: 0.00    Total pack years: 4.40    Types: Cigarettes    Quit date: 07/20/2020    Years since quitting: 2.2   Smokeless tobacco: Never  Vaping Use   Vaping Use: Never used  Substance and Sexual Activity   Alcohol  use: Not Currently    Comment: Socially    Drug use: No   Sexual activity: Not Currently  Other Topics Concern   Not on file  Social History Narrative   Not on file   Social Determinants of Health   Financial Resource Strain: Not on file  Food Insecurity: Not on file  Transportation Needs: Not on file  Physical Activity: Not on file  Stress: Not on file  Social Connections: Not on file  Intimate Partner Violence: Not on file    Outpatient Medications Prior to Visit  Medication Sig Dispense Refill   albuterol (PROVENTIL) (2.5 MG/3ML) 0.083% nebulizer solution TAKE 3 MLS (2.5 MG TOTAL) BY NEBULIZATION EVERY 6 (SIX) HOURS AS NEEDED FOR WHEEZING OR SHORTNESS OF BREATH. 90 mL 2   albuterol (VENTOLIN HFA) 108 (90 Base) MCG/ACT inhaler Inhale 2 puffs into the lungs every 6 (six) hours as needed for wheezing or shortness of breath. 18 g 2   Ascorbic Acid (VITAMIN C) 100 MG tablet Take 1 tablet (100 mg total) by mouth daily. 60 tablet 2   aspirin EC 81 MG tablet Take 1 tablet (81 mg total) by mouth daily. Swallow whole. 80 tablet 4   Cholecalciferol (VITAMIN D3) 50 MCG (2000 UT) TABS Take 2,000 Units by mouth daily. 30 tablet 3   Cyanocobalamin (B-12 PO) Take 1 tablet by mouth daily.     dextromethorphan-guaiFENesin (MUCINEX DM) 30-600 MG 12hr tablet Take 1 tablet by mouth 2 (two) times daily as needed for cough. 60 tablet 4   docusate sodium (COLACE) 100 MG capsule Take 1 capsule (100 mg total) by mouth daily. 10 capsule 0   ezetimibe (ZETIA) 10 MG tablet Take 1 tablet (10 mg total) by mouth daily. 90 tablet 3   famotidine (PEPCID) 20 MG tablet TAKE 1 TABLET (20 MG TOTAL) BY MOUTH 2 (TWO) TIMES DAILY. 60 tablet 3   furosemide (LASIX) 20 MG tablet Take 1 tablet (20 mg total) by mouth daily as needed. 30 tablet 2   Glycopyrrolate-Formoterol (BEVESPI AEROSPHERE) 9-4.8 MCG/ACT AERO Inhale 1 puff into the lungs in the morning and at bedtime. 10.7 g 6   Multiple Vitamins-Minerals (ZINC PO) Take 1  tablet by mouth daily.     OXYGEN Inhale 2 L/min into the lungs continuous.      potassium chloride SA (KLOR-CON M) 20 MEQ tablet Take 1 tablet (20 mEq total) by mouth daily as needed. 30 tablet 3   rosuvastatin (CRESTOR) 40 MG tablet Take 1 tablet (40 mg total) by mouth daily. 60 tablet 4   sodium chloride (OCEAN) 0.65 % SOLN nasal spray Place  2 sprays into both nostrils in the morning, at noon, and at bedtime. 104 mL 3   No facility-administered medications prior to visit.    Allergies  Allergen Reactions   Erythromycin Other (See Comments)    Allergy from childhood- does not take it    ROS Review of Systems  Constitutional: Negative.   HENT: Negative.  Negative for ear pain, postnasal drip, rhinorrhea, sinus pressure, sore throat, trouble swallowing and voice change.   Eyes: Negative.   Respiratory:  Positive for shortness of breath. Negative for apnea, cough, choking, chest tightness, wheezing and stridor.        Mucus is clear   Cardiovascular: Negative.  Negative for chest pain, palpitations and leg swelling.  Gastrointestinal: Negative.  Negative for abdominal distention, abdominal pain, nausea and vomiting.  Genitourinary: Negative.   Musculoskeletal: Negative.  Negative for arthralgias and myalgias.  Skin: Negative.  Negative for rash.  Allergic/Immunologic: Negative.  Negative for environmental allergies and food allergies.  Neurological: Negative.  Negative for dizziness, syncope, weakness and headaches.  Hematological: Negative.  Negative for adenopathy. Does not bruise/bleed easily.  Psychiatric/Behavioral: Negative.  Negative for agitation, sleep disturbance and suicidal ideas. The patient is not nervous/anxious.       Objective:    Physical Exam Vitals reviewed.  Constitutional:      Appearance: Normal appearance. He is well-developed. He is not diaphoretic.     Comments: Thin  HENT:     Head: Normocephalic and atraumatic.     Nose: No nasal deformity, septal  deviation, mucosal edema or rhinorrhea.     Right Sinus: No maxillary sinus tenderness or frontal sinus tenderness.     Left Sinus: No maxillary sinus tenderness or frontal sinus tenderness.     Mouth/Throat:     Pharynx: No oropharyngeal exudate.  Eyes:     General: No scleral icterus.    Conjunctiva/sclera: Conjunctivae normal.     Pupils: Pupils are equal, round, and reactive to light.  Neck:     Thyroid: No thyromegaly.     Vascular: No carotid bruit or JVD.     Trachea: Trachea normal. No tracheal tenderness or tracheal deviation.  Cardiovascular:     Rate and Rhythm: Normal rate and regular rhythm.     Chest Wall: PMI is not displaced.     Pulses: Normal pulses. No decreased pulses.     Heart sounds: Normal heart sounds, S1 normal and S2 normal. Heart sounds not distant. No murmur heard.    No systolic murmur is present.     No diastolic murmur is present.     No friction rub. No gallop. No S3 or S4 sounds.  Pulmonary:     Effort: Pulmonary effort is normal. No tachypnea, accessory muscle usage or respiratory distress.     Breath sounds: No stridor. No decreased breath sounds, wheezing, rhonchi or rales.     Comments: Distant breath sounds Chest:     Chest wall: No tenderness.  Abdominal:     General: Bowel sounds are normal. There is no distension.     Palpations: Abdomen is soft. Abdomen is not rigid.     Tenderness: There is no abdominal tenderness. There is no guarding or rebound.  Musculoskeletal:        General: Normal range of motion.     Cervical back: Normal range of motion and neck supple. No edema, erythema or rigidity. No muscular tenderness. Normal range of motion.  Lymphadenopathy:     Head:  Right side of head: No submental or submandibular adenopathy.     Left side of head: No submental or submandibular adenopathy.     Cervical: No cervical adenopathy.  Skin:    General: Skin is warm and dry.     Coloration: Skin is not pale.     Findings: No rash.      Nails: There is no clubbing.  Neurological:     Mental Status: He is alert and oriented to person, place, and time.     Sensory: No sensory deficit.  Psychiatric:        Mood and Affect: Mood normal.        Speech: Speech normal.        Behavior: Behavior normal.     There were no vitals taken for this visit. Wt Readings from Last 3 Encounters:  06/05/22 130 lb 3.2 oz (59.1 kg)  02/05/22 128 lb 9.6 oz (58.3 kg)  01/29/22 126 lb 9.6 oz (57.4 kg)     Health Maintenance Due  Topic Date Due   Medicare Annual Wellness (AWV)  Never done   COVID-19 Vaccine (3 - Moderna risk series) 06/01/2020   COLON CANCER SCREENING ANNUAL FOBT  08/23/2020    There are no preventive care reminders to display for this patient.  Lab Results  Component Value Date   TSH 1.757 04/25/2019   Lab Results  Component Value Date   WBC 6.2 09/28/2021   HGB 13.6 09/28/2021   HCT 42.8 09/28/2021   MCV 94 09/28/2021   PLT 217 09/28/2021   Lab Results  Component Value Date   NA 146 (H) 06/05/2022   K 4.6 06/05/2022   CO2 34 (H) 06/05/2022   GLUCOSE 65 (L) 06/05/2022   BUN 14 06/05/2022   CREATININE 0.81 06/05/2022   BILITOT 0.4 09/28/2021   ALKPHOS 72 09/28/2021   AST 20 09/28/2021   ALT 13 09/28/2021   PROT 7.4 09/28/2021   ALBUMIN 4.6 09/28/2021   CALCIUM 9.6 06/05/2022   ANIONGAP 10 09/07/2020   EGFR 100 06/05/2022   Lab Results  Component Value Date   CHOL 223 (H) 06/05/2022   Lab Results  Component Value Date   HDL 90 06/05/2022   Lab Results  Component Value Date   LDLCALC 119 (H) 06/05/2022   Lab Results  Component Value Date   TRIG 81 06/05/2022   Lab Results  Component Value Date   CHOLHDL 2.5 06/05/2022   Lab Results  Component Value Date   HGBA1C 5.9 (H) 03/08/2020      Assessment & Plan:   Problem List Items Addressed This Visit   None No orders of the defined types were placed in this encounter. Flu vaccine given Colon cancer screening with fecal  occult kit Follow-up: No follow-ups on file.    Shan Levans, MD

## 2022-10-09 ENCOUNTER — Ambulatory Visit: Payer: Medicare HMO | Admitting: Critical Care Medicine

## 2022-10-09 ENCOUNTER — Telehealth: Payer: Self-pay | Admitting: Critical Care Medicine

## 2022-10-09 NOTE — Telephone Encounter (Signed)
Copied from CRM [id number]. Topic: Appointment Scheduling - Scheduling Inquiry for Clinic >> Oct 09, 2022 12:48 PM Marlow Baars wrote: Reason for CRM: Sam with [employer] called in stating she is going to send over a medical record request prior to patients next office visit. Please assist further

## 2022-10-10 NOTE — Telephone Encounter (Signed)
Noted  FYI TO PCP

## 2022-11-12 ENCOUNTER — Telehealth: Payer: Self-pay | Admitting: Critical Care Medicine

## 2022-11-12 NOTE — Telephone Encounter (Signed)
Copied from CRM [id number]. Topic: Medicare AWV >> Nov 12, 2022 10:12 AM Rushie Goltz wrote: Reason for CRM: Called patient to schedule Medicare Annual Wellness Visit (AWV). Left message for patient to call back and schedule Medicare Annual Wellness Visit (AWV).  Last date of AWV: AWVI eligible as of 10/01/22   Please schedule an AWVI appointment at any time with [facility] VISIT.  If any questions, please contact me at [phone].    Thank you,  [facility] Support [facility] Direct dial  [phone]

## 2022-11-19 ENCOUNTER — Other Ambulatory Visit (HOSPITAL_COMMUNITY): Payer: Self-pay

## 2022-11-19 ENCOUNTER — Other Ambulatory Visit: Payer: Self-pay

## 2022-11-19 ENCOUNTER — Encounter (HOSPITAL_COMMUNITY): Payer: Self-pay

## 2022-11-23 ENCOUNTER — Other Ambulatory Visit: Payer: Self-pay

## 2022-12-25 NOTE — Patient Instructions (Incomplete)
Anthony Nelson , Thank you for taking time to come for your Medicare Wellness Visit. I appreciate your ongoing commitment to your health goals. Please review the following plan we discussed and let me know if I can assist you in the future.   These are the goals we discussed:  Goals   None     This is a list of the screening recommended for you and due dates:  Health Maintenance  Topic Date Due   Medicare Annual Wellness Visit  Never done   COVID-19 Vaccine (3 - Moderna risk series) 06/01/2020   Stool Blood Test  08/23/2020   Flu Shot  01/31/2023   DTaP/Tdap/Td vaccine (3 - Td or Tdap) 06/23/2029   Hepatitis C Screening  Completed   HIV Screening  Completed   Zoster (Shingles) Vaccine  Completed   HPV Vaccine  Aged Out   Colon Cancer Screening  Discontinued    Advanced directives: Information on Advanced Care Planning can be found at [facility] Advance Health Care Directives Advance Health Care Directives ([email or web address] Please bring a copy of your health care power of attorney and living will to the office to be added to your chart at your convenience.  Conditions/risks identified: Aim for 30 minutes of exercise or brisk walking, 6-8 glasses of water, and 5 servings of fruits and vegetables each day.  Next appointment: Follow up in one year for your annual wellness visit   Preventive Care 40-64 Years, Male Preventive care refers to lifestyle choices and visits with your health care provider that can promote health and wellness. What does preventive care include? A yearly physical exam. This is also called an annual well check. Dental exams once or twice a year. Routine eye exams. Ask your health care provider how often you should have your eyes checked. Personal lifestyle choices, including: Daily care of your teeth and gums. Regular physical activity. Eating a healthy diet. Avoiding tobacco and drug use. Limiting alcohol use. Practicing safe sex. Taking  low-dose aspirin every day starting at age 86. What happens during an annual well check? The services and screenings done by your health care provider during your annual well check will depend on your age, overall health, lifestyle risk factors, and family history of disease. Counseling  Your health care provider may ask you questions about your: Alcohol use. Tobacco use. Drug use. Emotional well-being. Home and relationship well-being. Sexual activity. Eating habits. Work and work Astronomer. Screening  You may have the following tests or measurements: Height, weight, and BMI. Blood pressure. Lipid and cholesterol levels. These may be checked every 5 years, or more frequently if you are over 58 years old. Skin check. Lung cancer screening. You may have this screening every year starting at age 52 if you have a 30-pack-year history of smoking and currently smoke or have quit within the past 15 years. Fecal occult blood test (FOBT) of the stool. You may have this test every year starting at age 27. Flexible sigmoidoscopy or colonoscopy. You may have a sigmoidoscopy every 5 years or a colonoscopy every 10 years starting at age 12. Prostate cancer screening. Recommendations will vary depending on your family history and other risks. Hepatitis C blood test. Hepatitis B blood test. Sexually transmitted disease (STD) testing. Diabetes screening. This is done by checking your blood sugar (glucose) after you have not eaten for a while (fasting). You may have this done every 1-3 years. Discuss your test results, treatment options, and if necessary, the  need for more tests with your health care provider. Vaccines  Your health care provider may recommend certain vaccines, such as: Influenza vaccine. This is recommended every year. Tetanus, diphtheria, and acellular pertussis (Tdap, Td) vaccine. You may need a Td booster every 10 years. Zoster vaccine. You may need this after age  33. Pneumococcal 13-valent conjugate (PCV13) vaccine. You may need this if you have certain conditions and have not been vaccinated. Pneumococcal polysaccharide (PPSV23) vaccine. You may need one or two doses if you smoke cigarettes or if you have certain conditions. Talk to your health care provider about which screenings and vaccines you need and how often you need them. This information is not intended to replace advice given to you by your health care provider. Make sure you discuss any questions you have with your health care provider. Document Released: 07/15/2015 Document Revised: 03/07/2016 Document Reviewed: 04/19/2015 Elsevier Interactive Patient Education  2017 [employer].  Fall Prevention in the Home Falls can cause injuries. They can happen to people of all ages. There are many things you can do to make your home safe and to help prevent falls. What can I do on the outside of my home? Regularly fix the edges of walkways and driveways and fix any cracks. Remove anything that might make you trip as you walk through a door, such as a raised step or threshold. Trim any bushes or trees on the path to your home. Use bright outdoor lighting. Clear any walking paths of anything that might make someone trip, such as rocks or tools. Regularly check to see if handrails are loose or broken. Make sure that both sides of any steps have handrails. Any raised decks and porches should have guardrails on the edges. Have any leaves, snow, or ice cleared regularly. Use sand or salt on walking paths during winter. Clean up any spills in your garage right away. This includes oil or grease spills. What can I do in the bathroom? Use night lights. Install grab bars by the toilet and in the tub and shower. Do not use towel bars as grab bars. Use non-skid mats or decals in the tub or shower. If you need to sit down in the shower, use a plastic, non-slip stool. Keep the floor dry. Clean up any water  that spills on the floor as soon as it happens. Remove soap buildup in the tub or shower regularly. Attach bath mats securely with double-sided non-slip rug tape. Do not have throw rugs and other things on the floor that can make you trip. What can I do in the bedroom? Use night lights. Make sure that you have a light by your bed that is easy to reach. Do not use any sheets or blankets that are too big for your bed. They should not hang down onto the floor. Have a firm chair that has side arms. You can use this for support while you get dressed. Do not have throw rugs and other things on the floor that can make you trip. What can I do in the kitchen? Clean up any spills right away. Avoid walking on wet floors. Keep items that you use a lot in easy-to-reach places. If you need to reach something above you, use a strong step stool that has a grab bar. Keep electrical cords out of the way. Do not use floor polish or wax that makes floors slippery. If you must use wax, use non-skid floor wax. Do not have throw rugs and other things  on the floor that can make you trip. What can I do with my stairs? Do not leave any items on the stairs. Make sure that there are handrails on both sides of the stairs and use them. Fix handrails that are broken or loose. Make sure that handrails are as long as the stairways. Check any carpeting to make sure that it is firmly attached to the stairs. Fix any carpet that is loose or worn. Avoid having throw rugs at the top or bottom of the stairs. If you do have throw rugs, attach them to the floor with carpet tape. Make sure that you have a light switch at the top of the stairs and the bottom of the stairs. If you do not have them, ask someone to add them for you. What else can I do to help prevent falls? Wear shoes that: Do not have high heels. Have rubber bottoms. Are comfortable and fit you well. Are closed at the toe. Do not wear sandals. If you use a  stepladder: Make sure that it is fully opened. Do not climb a closed stepladder. Make sure that both sides of the stepladder are locked into place. Ask someone to hold it for you, if possible. Clearly mark and make sure that you can see: Any grab bars or handrails. First and last steps. Where the edge of each step is. Use tools that help you move around (mobility aids) if they are needed. These include: Canes. Walkers. Scooters. Crutches. Turn on the lights when you go into a dark area. Replace any light bulbs as soon as they burn out. Set up your furniture so you have a clear path. Avoid moving your furniture around. If any of your floors are uneven, fix them. If there are any pets around you, be aware of where they are. Review your medicines with your doctor. Some medicines can make you feel dizzy. This can increase your chance of falling. Ask your doctor what other things that you can do to help prevent falls. This information is not intended to replace advice given to you by your health care provider. Make sure you discuss any questions you have with your health care provider. Document Released: 04/14/2009 Document Revised: 11/24/2015 Document Reviewed: 07/23/2014 Elsevier Interactive Patient Education  2017 [employer].

## 2022-12-26 ENCOUNTER — Ambulatory Visit: Payer: Medicare HMO | Attending: Critical Care Medicine

## 2022-12-26 VITALS — Ht 70.5 in | Wt 130.0 lb

## 2022-12-26 DIAGNOSIS — Z0000 Encounter for general adult medical examination without abnormal findings: Secondary | ICD-10-CM

## 2022-12-26 NOTE — Progress Notes (Signed)
Subjective:   Anthony Nelson is a 62 y.o. male who presents for an Initial Medicare Annual Wellness Visit.  Visit Complete: Virtual  I connected with  Anthony Nelson on 12/26/22 by a audio enabled telemedicine application and verified that I am speaking with the correct person using two identifiers.  Patient Location: Home  Provider Location: Home Office  I discussed the limitations of evaluation and management by telemedicine. The patient expressed understanding and agreed to proceed.  Review of Systems     Cardiac Risk Factors include: male gender;dyslipidemia;advanced age (>45men, >37 women)     Objective:    Today's Vitals   12/26/22 1209  Weight: 130 lb (59 kg)  Height: 5' 10.5" (1.791 m)   Body mass index is 18.39 kg/m.     12/26/2022   12:46 PM 03/11/2020    2:34 PM 04/25/2019    1:31 PM 02/08/2018    7:58 AM 08/02/2014    4:22 PM  Advanced Directives  Does Patient Have a Medical Advance Directive? No No No No No  Does patient want to make changes to medical advance directive?  No - Patient declined     Would patient like information on creating a medical advance directive? Yes (MAU/Ambulatory/Procedural Areas - Information given) No - Patient declined No - Guardian declined No - Patient declined     Current Medications (verified) Outpatient Encounter Medications as of 12/26/2022  Medication Sig   albuterol (PROVENTIL) (2.5 MG/3ML) 0.083% nebulizer solution USE 3 MLS BY NEBULIZATION EVERY 6 HOURS AS NEEDED FOR WHEEZING OR SHORTNESS OF BREATH.   albuterol (VENTOLIN HFA) 108 (90 Base) MCG/ACT inhaler Inhale 2 puffs into the lungs every 6 hours as needed for wheezing or shortness of breath.   Ascorbic Acid (VITAMIN C) 100 MG tablet Take 1 tablet (100 mg total) by mouth daily.   aspirin EC 81 MG tablet Take 1 tablet (81 mg total) by mouth daily. Swallow whole.   Cholecalciferol (VITAMIN D3) 50 MCG (2000 UT) TABS Take 2,000 Units by mouth daily.   Cyanocobalamin (B-12 PO)  Take 1 tablet by mouth daily.   dextromethorphan-guaiFENesin (MUCINEX DM) 30-600 MG 12hr tablet Take 1 tablet by mouth 2 (two) times daily as needed for cough.   docusate sodium (COLACE) 100 MG capsule Take 1 capsule (100 mg total) by mouth daily.   ezetimibe (ZETIA) 10 MG tablet Take 1 tablet (10 mg total) by mouth daily.   famotidine (PEPCID) 20 MG tablet TAKE 1 TABLET (20 MG TOTAL) BY MOUTH 2 (TWO) TIMES DAILY.   Glycopyrrolate-Formoterol (BEVESPI AEROSPHERE) 9-4.8 MCG/ACT AERO Inhale 1 puff into the lungs in the morning and at bedtime.   Multiple Vitamins-Minerals (ZINC PO) Take 1 tablet by mouth daily.   OXYGEN Inhale 2 L/min into the lungs continuous.    rosuvastatin (CRESTOR) 40 MG tablet Take 1 tablet (40 mg) by mouth daily.   sodium chloride (OCEAN) 0.65 % SOLN nasal spray Place 2 sprays into both nostrils in the morning, at noon, and at bedtime.   furosemide (LASIX) 20 MG tablet Take 1 tablet (20 mg total) by mouth daily as needed.   potassium chloride SA (KLOR-CON M) 20 MEQ tablet Take 1 tablet (20 mEq total) by mouth daily as needed.   No facility-administered encounter medications on file as of 12/26/2022.    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   Respiratory failure with hypoxia (HCC)    Past Surgical History:  Procedure Laterality Date   HERNIA REPAIR     Family History  Problem Relation Age of Onset   Cancer Mother    Cancer Father    CAD Father    Heart failure Father    Diabetes Maternal Grandmother    Kidney disease Maternal Grandmother    Kidney cancer Paternal Grandmother    Heart attack Paternal Grandfather     Social History   Socioeconomic History   Marital status: Married    Spouse name: Anthony Nelson   Number of children: 3   Years of education: 16   Highest education level: Bachelor's degree (e.g., BA, AB, BS)  Occupational History   Occupation: Electrical engineer  Tobacco Use   Smoking status: Former    Packs/day: 0.10    Years: 44.00    Additional pack years: 0.00    Total pack years: 4.40    Types: Cigarettes    Quit date: 07/20/2020    Years since quitting: 2.4   Smokeless tobacco: Never  Vaping Use   Vaping Use: Never used  Substance and Sexual Activity   Alcohol use: Not Currently    Comment: Socially    Drug use: No   Sexual activity: Not Currently  Other Topics Concern   Not on file  Social History Narrative   Not on file   Social Determinants of Health   Financial Resource Strain: Not on file  Food Insecurity: Food Insecurity Present (12/26/2022)   Hunger Vital Sign    Worried About Running Out of Food in the Last Year: Sometimes true    Ran Out of Food in the Last Year: Sometimes true  Transportation Needs: Unmet Transportation Needs (12/26/2022)   PRAPARE - Transportation    Lack of Transportation (Medical): Yes    Lack of Transportation (Non-Medical): Yes  Physical Activity: Insufficiently Active (12/26/2022)   Exercise Vital Sign    Days of Exercise per Week: 3 days    Minutes of Exercise per Session: 30 min  Stress: No Stress Concern Present (12/26/2022)   [employer] of Occupational Health - Occupational Stress Questionnaire    Feeling of Stress : Not at all  Social Connections: Moderately Isolated (12/26/2022)   Social Connection and Isolation Panel [NHANES]    Frequency of Communication with Friends and Family: More than three times a week    Frequency of Social Gatherings with Friends and Family: Three times a week    Attends Religious Services: Never    Active Member of Clubs or Organizations: No    Attends Engineer, structural:  Never    Marital Status: Married    Tobacco Counseling Counseling given: Not Answered   Clinical Intake:  Pre-visit preparation completed: Yes  Pain : No/denies pain     Diabetes: No  How often do you need to have someone help you when you read instructions, pamphlets, or other written materials from your doctor or pharmacy?: 1 - Never  Interpreter Needed?: No  Information entered by :: Kandis Fantasia LPN   Activities of Daily Living    12/26/2022   12:10 PM  In your present state of health, do you have any difficulty performing the following activities:  Hearing? 0  Vision?  0  Difficulty concentrating or making decisions? 0  Walking or climbing stairs? 0  Dressing or bathing? 0  Doing errands, shopping? 0  Preparing Food and eating ? N  Using the Toilet? N  In the past six months, have you accidently leaked urine? N  Do you have problems with loss of bowel control? N  Managing your Medications? N  Managing your Finances? N  Housekeeping or managing your Housekeeping? N    Patient Care Team: Storm Frisk, MD as PCP - General (Pulmonary Disease) Chilton Si, MD as Attending Physician (Cardiology)  Indicate any recent Medical Services you may have received from other than [facility] providers in the past year (date may be approximate).     Assessment:   This is a routine wellness examination for [place].  Hearing/Vision screen Hearing Screening - Comments:: Denies hearing difficulties   Vision Screening - Comments:: No vision problems; will schedule routine eye exam soon    Dietary issues and exercise activities discussed:     Goals Addressed             This Visit's Progress    Securing housing and complete all pcp visits        Depression Screen    12/26/2022   12:14 PM 06/05/2022    1:59 PM 01/29/2022    3:48 PM 09/28/2021   11:17 AM 05/02/2021    4:17 PM 12/05/2020    3:05 PM 10/04/2020    4:19 PM  PHQ 2/9 Scores  PHQ - 2 Score 0 0 0 0 0  0 4  PHQ- 9 Score    0  0 15    Fall Risk    12/26/2022   12:47 PM 06/05/2022    1:58 PM 01/29/2022    3:48 PM 05/02/2021    4:16 PM 12/05/2020    3:02 PM  Fall Risk   Falls in the past year? 0 0 0 0 0  Number falls in past yr: 0 0 0 0 0  Injury with Fall? 0 0 0 0 0  Risk for fall due to : No Fall Risks No Fall Risks No Fall Risks No Fall Risks   Follow up Falls prevention discussed;Education provided;Falls evaluation completed        MEDICARE RISK AT HOME:  Medicare Risk at Home - 12/26/22 1247     Any stairs in or around the home? No    If so, are there any without handrails? No    Home free of loose throw rugs in walkways, pet beds, electrical cords, etc? Yes    Adequate lighting in your home to reduce risk of falls? Yes    Life alert? No    Use of a cane, walker or w/c? No    Grab bars in the bathroom? Yes    Shower chair or bench in shower? Yes    Elevated toilet seat or a handicapped toilet? Yes             TIMED UP AND GO:  Was the test performed? No    Cognitive Function:        12/26/2022   12:47 PM  6CIT Screen  What Year? 0 points  What month? 0 points  What time? 0 points  Count back from 20 0 points  Months in reverse 0 points  Repeat phrase 0 points  Total Score 0 points    Immunizations Immunization History  Administered Date(s) Administered   Fluad Quad(high Dose 65+) 06/05/2022  Influenza,inj,Quad PF,6+ Mos 02/18/2018, 03/25/2020, 05/02/2021   Influenza-Unspecified 05/02/2021   Moderna Sars-Covid-2 Vaccination 04/06/2020, 05/04/2020   PNEUMOCOCCAL CONJUGATE-20 05/02/2021   Tdap 06/23/2019, 06/24/2019   Zoster Recombinat (Shingrix) 05/10/2020, 12/05/2020    TDAP status: Up to date  Pneumococcal vaccine status: Up to date  Covid-19 vaccine status: Information provided on how to obtain vaccines.   Qualifies for Shingles Vaccine? Yes   Zostavax completed No   Shingrix Completed?: Yes  Screening Tests Health Maintenance  Topic  Date Due   COVID-19 Vaccine (3 - Moderna risk series) 06/01/2020   COLON CANCER SCREENING ANNUAL FOBT  08/23/2020   INFLUENZA VACCINE  01/31/2023   Medicare Annual Wellness (AWV)  12/26/2023   DTaP/Tdap/Td (3 - Td or Tdap) 06/23/2029   Hepatitis C Screening  Completed   HIV Screening  Completed   Zoster Vaccines- Shingrix  Completed   HPV VACCINES  Aged Out   Colonoscopy  Discontinued    Health Maintenance  Health Maintenance Due  Topic Date Due   COVID-19 Vaccine (3 - Moderna risk series) 06/01/2020   COLON CANCER SCREENING ANNUAL FOBT  08/23/2020    Colorectal cancer screening: Patient declines at this time   Lung Cancer Screening: (Low Dose CT Chest recommended if Age 45-80 years, 20 pack-year currently smoking OR have quit w/in 15years.) does not qualify.   Lung Cancer Screening Referral: n/a  Additional Screening:  Hepatitis C Screening: does qualify; Completed 02/08/18  Vision Screening: Recommended annual ophthalmology exams for early detection of glaucoma and other disorders of the eye. Is the patient up to date with their annual eye exam?  No  Who is the provider or what is the name of the office in which the patient attends annual eye exams? none If pt is not established with a provider, would they like to be referred to a provider to establish care? No .   Dental Screening: Recommended annual dental exams for proper oral hygiene  Community Resource Referral / Chronic Care Management: CRR required this visit?  No   CCM required this visit?  No    Plan:     I have personally reviewed and noted the following in the patient's chart:   Medical and social history Use of alcohol, tobacco or illicit drugs  Current medications and supplements including opioid prescriptions. Patient is not currently taking opioid prescriptions. Functional ability and status Nutritional status Physical activity Advanced directives List of other physicians Hospitalizations,  surgeries, and ER visits in previous 12 months Vitals Screenings to include cognitive, depression, and falls Referrals and appointments  In addition, I have reviewed and discussed with patient certain preventive protocols, quality metrics, and best practice recommendations. A written personalized care plan for preventive services as well as general preventive health recommendations were provided to patient.     Kandis Fantasia [place], [place]   8/41/3244   After Visit Summary: (MyChart) Due to this being a telephonic visit, the after visit summary with patients personalized plan was offered to patient via MyChart   Nurse Notes: No concerns

## 2023-01-15 ENCOUNTER — Other Ambulatory Visit: Payer: Self-pay

## 2023-01-16 ENCOUNTER — Encounter: Payer: Self-pay | Admitting: Critical Care Medicine

## 2023-01-16 ENCOUNTER — Other Ambulatory Visit: Payer: Self-pay

## 2023-01-16 ENCOUNTER — Other Ambulatory Visit: Payer: Self-pay | Admitting: Critical Care Medicine

## 2023-01-16 ENCOUNTER — Ambulatory Visit: Payer: Medicare HMO | Attending: Critical Care Medicine | Admitting: Critical Care Medicine

## 2023-01-16 VITALS — BP 95/63 | HR 88 | Temp 98.4°F | Ht 70.0 in | Wt 128.0 lb

## 2023-01-16 DIAGNOSIS — I700 Atherosclerosis of aorta: Secondary | ICD-10-CM

## 2023-01-16 DIAGNOSIS — R634 Abnormal weight loss: Secondary | ICD-10-CM | POA: Diagnosis not present

## 2023-01-16 DIAGNOSIS — I5033 Acute on chronic diastolic (congestive) heart failure: Secondary | ICD-10-CM | POA: Diagnosis not present

## 2023-01-16 DIAGNOSIS — E010 Iodine-deficiency related diffuse (endemic) goiter: Secondary | ICD-10-CM | POA: Diagnosis not present

## 2023-01-16 DIAGNOSIS — I50812 Chronic right heart failure: Secondary | ICD-10-CM | POA: Diagnosis not present

## 2023-01-16 DIAGNOSIS — J9611 Chronic respiratory failure with hypoxia: Secondary | ICD-10-CM

## 2023-01-16 DIAGNOSIS — J449 Chronic obstructive pulmonary disease, unspecified: Secondary | ICD-10-CM

## 2023-01-16 DIAGNOSIS — Z5901 Sheltered homelessness: Secondary | ICD-10-CM

## 2023-01-16 DIAGNOSIS — Z5900 Homelessness unspecified: Secondary | ICD-10-CM | POA: Insufficient documentation

## 2023-01-16 DIAGNOSIS — I2723 Pulmonary hypertension due to lung diseases and hypoxia: Secondary | ICD-10-CM | POA: Diagnosis not present

## 2023-01-16 DIAGNOSIS — J9612 Chronic respiratory failure with hypercapnia: Secondary | ICD-10-CM

## 2023-01-16 DIAGNOSIS — Z1211 Encounter for screening for malignant neoplasm of colon: Secondary | ICD-10-CM

## 2023-01-16 NOTE — Assessment & Plan Note (Signed)
Continue 2L oxygen. 

## 2023-01-16 NOTE — Patient Instructions (Signed)
Medications refilled  Labs thyroid Thyroid ultrasound Return 6 months

## 2023-01-16 NOTE — Assessment & Plan Note (Signed)
Continue oxygen therapy and as needed furosemide and potassium

## 2023-01-16 NOTE — Progress Notes (Signed)
Established Patient Office Visit  Subjective:  Patient ID: Anthony Nelson, male    DOB: [birth year]  Age: 62 y.o. MRN: [id number]  CC: Primary care follow-up   HPI 05/02/21 Anthony Nelson presents for primary care visit.  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  He just saw cardiology in July.  He is on chronic 2 L of oxygen home oxygen and is on low-dose furosemide and BiPAP at night.  He also maintains his inhalers and is no longer smoking cigarettes for a year now.  Patient's biggest complaint is that of chronic shoulder pain on the right.  He played sports when he was younger.  He is uncertain whether he tore her rotator cuff or not but is been an issue for about 2 years.  He is not able to abduct the shoulder above the plane of the collarbone.  He has pain anteriorly in the shoulder as well.  He uses over-the-counter medications for this.  Patient needs a repeat lipid panel has not had one since October 2021 goal is LDL less than 70  Patient agrees to and did receive both the Prevnar 20 vaccine and flu shot  09/28/21 Patient seen today for primary care follow-up.  He has severe COPD Gold stage IV and is on BiPAP at night with 2 L oxygen he takes 2 L during the day as well.  He does need a new nebulizer machine.  Unfortunately the patient has lost his housing.  He now lives in a motel.  Patient states his breathing is at baseline. Patient states his shoulder is no longer hurting. Mental health is stable.  7/31 Since the last visit the patient has had improvement in that he now lives with a servant house he has meals there.  He maintains oxygen 2 L but would like to have a light weight portable oxygen concentrator.  He also would be a candidate for pulmonary rehab.  He has minimal productive cough his dyspnea is at baseline.  On arrival blood pressure is 110/69.  He has been to see his primary care provider in the [place]  system but most of his care is here privately  06/05/22 Patient seen in return follow-up he maintains 2 L of oxygen COPD is stable at this time he is interested in getting a portable oxygen concentrator he has a cylinders at this time.  He stays at [facility] and doing well with this he also maintains a relationship with a primary care doctor in the [place] system.  Medications have not changed.  There are no new complaints.  His BMI has increased with nutrition.  Blood pressure on arrival is good 116/73 he needs to process another fecal occult kit the last 1 did not process  01/16/23 The patient is seen in follow-up from December visit overall he is stable from a pulmonary perspective.  He does need to repeat his fecal occult kit test.  He is on oxygen breathing well taking his inhalers.  He follows with his primary care physician in the [place] system.  He is in the servant Center but about to get housing.  There are no new complaints. 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   Respiratory failure with hypoxia (HCC)     Past Surgical History:  Procedure Laterality Date   HERNIA REPAIR      Family History  Problem Relation Age of Onset   Cancer Mother    Cancer Father    CAD Father    Heart failure Father    Diabetes Maternal Grandmother    Kidney disease Maternal Grandmother    Kidney cancer Paternal Grandmother    Heart attack Paternal Grandfather     Social History   Socioeconomic History   Marital status: Married    Spouse name: Anthony Nelson   Number of children: 3   Years of education: 16   Highest education level: Bachelor's degree (e.g., BA, AB, BS)  Occupational History    Occupation: Security Guard  Tobacco Use   Smoking status: Former    Current packs/day: 0.00    Average packs/day: 0.1 packs/day for 44.0 years (4.4 ttl pk-yrs)    Types: Cigarettes    Start date: 07/20/1976    Quit date: 07/20/2020    Years since quitting: 2.4   Smokeless tobacco: Never  Vaping Use   Vaping status: Never Used  Substance and Sexual Activity   Alcohol use: Not Currently    Comment: Socially    Drug use: No   Sexual activity: Not Currently  Other Topics Concern   Not on file  Social History Narrative   Not on file   Social Determinants of Health   Financial Resource Strain: Not on file  Food Insecurity: Food Insecurity Present (12/26/2022)   Hunger Vital Sign    Worried About Running Out of Food in the Last Year: Sometimes true    Ran Out of Food in the Last Year: Sometimes true  Transportation Needs: Unmet Transportation Needs (12/26/2022)   PRAPARE - Transportation    Lack of Transportation (Medical): Yes    Lack of Transportation (Non-Medical): Yes  Physical Activity: Insufficiently Active (12/26/2022)   Exercise Vital Sign    Days of Exercise per Week: 3 days    Minutes of Exercise per Session: 30 min  Stress: No Stress Concern Present (12/26/2022)   Anthony Nelson [employer] Occupational [employer] - Occupational Stress Questionnaire    Feeling of Stress : Not at all  Social Connections: Moderately Isolated (12/26/2022)   Social Connection and Isolation Panel [NHANES]    Frequency of Communication with Friends and Family: More than three times a week    Frequency of Social Gatherings with Friends and Family: Three times a week    Attends Religious Services: Never    Active Member of Clubs or Organizations: No    Attends Banker Meetings: Never    Marital Status: Married  Catering manager Violence: Not At Risk (12/26/2022)   Humiliation, Afraid, Rape, and Kick questionnaire    Fear of Current or Ex-Partner: No    Emotionally Abused: No     Physically Abused: No    Sexually Abused: No    Outpatient Medications Prior to Visit  Medication Sig Dispense Refill   albuterol (PROVENTIL) (2.5 MG/3ML) 0.083% nebulizer solution USE 3 MLS BY NEBULIZATION EVERY 6 HOURS AS NEEDED FOR WHEEZING OR SHORTNESS OF BREATH. 90 mL 2   albuterol (VENTOLIN HFA) 108 (90 Base) MCG/ACT inhaler Inhale 2 puffs into the lungs every 6 hours as needed for wheezing or shortness of breath. 18 g 2   Ascorbic Acid (VITAMIN C) 100 MG tablet Take  1 tablet (100 mg total) by mouth daily. 60 tablet 2   aspirin EC 81 MG tablet Take 1 tablet (81 mg total) by mouth daily. Swallow whole. 80 tablet 4   Cholecalciferol (VITAMIN D3) 50 MCG (2000 UT) TABS Take 2,000 Units by mouth daily. 30 tablet 3   Cyanocobalamin (B-12 PO) Take 1 tablet by mouth daily.     dextromethorphan-guaiFENesin (MUCINEX DM) 30-600 MG 12hr tablet Take 1 tablet by mouth 2 (two) times daily as needed for cough. 60 tablet 4   docusate sodium (COLACE) 100 MG capsule Take 1 capsule (100 mg total) by mouth daily. 10 capsule 0   famotidine (PEPCID) 20 MG tablet TAKE 1 TABLET (20 MG TOTAL) BY MOUTH 2 (TWO) TIMES DAILY. 60 tablet 3   Glycopyrrolate-Formoterol (BEVESPI AEROSPHERE) 9-4.8 MCG/ACT AERO Inhale 1 puff into the lungs in the morning and at bedtime. 10.7 g 6   Multiple Vitamins-Minerals (ZINC PO) Take 1 tablet by mouth daily.     OXYGEN Inhale 2 L/min into the lungs continuous.      rosuvastatin (CRESTOR) 40 MG tablet Take 1 tablet (40 mg) by mouth daily. 60 tablet 4   sodium chloride (OCEAN) 0.65 % SOLN nasal spray Place 2 sprays into both nostrils in the morning, at noon, and at bedtime. 104 mL 3   furosemide (LASIX) 20 MG tablet Take 1 tablet (20 mg total) by mouth daily as needed. 30 tablet 2   potassium chloride SA (KLOR-CON M) 20 MEQ tablet Take 1 tablet (20 mEq total) by mouth daily as needed. 30 tablet 3   ezetimibe (ZETIA) 10 MG tablet Take 1 tablet (10 mg total) by mouth daily. (Patient not  taking: Reported on 01/16/2023) 90 tablet 3   No facility-administered medications prior to visit.    Allergies  Allergen Reactions   Erythromycin Other (See Comments)    Allergy from childhood- does not take it    ROS Review of Systems  Constitutional: Negative.   HENT: Negative.  Negative for ear pain, postnasal drip, rhinorrhea, sinus pressure, sore throat, trouble swallowing and voice change.   Eyes: Negative.   Respiratory:  Positive for shortness of breath. Negative for apnea, cough, choking, chest tightness, wheezing and stridor.        Mucus is clear   Cardiovascular: Negative.  Negative for chest pain, palpitations and leg swelling.  Gastrointestinal: Negative.  Negative for abdominal distention, abdominal pain, nausea and vomiting.  Genitourinary: Negative.   Musculoskeletal: Negative.  Negative for arthralgias and myalgias.  Skin: Negative.  Negative for rash.  Allergic/Immunologic: Negative.  Negative for environmental allergies and food allergies.  Neurological: Negative.  Negative for dizziness, syncope, weakness and headaches.  Hematological: Negative.  Negative for adenopathy. Does not bruise/bleed easily.  Psychiatric/Behavioral: Negative.  Negative for agitation, sleep disturbance and suicidal ideas. The patient is not nervous/anxious.       Objective:    Physical Exam Vitals reviewed.  Constitutional:      Appearance: Normal appearance. He is well-developed. He is not diaphoretic.     Comments: Thin  HENT:     Head: Normocephalic and atraumatic.     Nose: No nasal deformity, septal deviation, mucosal edema or rhinorrhea.     Right Sinus: No maxillary sinus tenderness or frontal sinus tenderness.     Left Sinus: No maxillary sinus tenderness or frontal sinus tenderness.     Mouth/Throat:     Pharynx: No oropharyngeal exudate.     Comments: Thyromegaly Eyes:  General: No scleral icterus.    Conjunctiva/sclera: Conjunctivae normal.     Pupils: Pupils  are equal, round, and reactive to light.  Neck:     Thyroid: No thyromegaly.     Vascular: No carotid bruit or JVD.     Trachea: Trachea normal. No tracheal tenderness or tracheal deviation.  Cardiovascular:     Rate and Rhythm: Normal rate and regular rhythm.     Chest Wall: PMI is not displaced.     Pulses: Normal pulses. No decreased pulses.     Heart sounds: Normal heart sounds, S1 normal and S2 normal. Heart sounds not distant. No murmur heard.    No systolic murmur is present.     No diastolic murmur is present.     No friction rub. No gallop. No S3 or S4 sounds.  Pulmonary:     Effort: Pulmonary effort is normal. No tachypnea, accessory muscle usage or respiratory distress.     Breath sounds: No stridor. No decreased breath sounds, wheezing, rhonchi or rales.     Comments: Distant breath sounds Chest:     Chest wall: No tenderness.  Abdominal:     General: Bowel sounds are normal. There is no distension.     Palpations: Abdomen is soft. Abdomen is not rigid.     Tenderness: There is no abdominal tenderness. There is no guarding or rebound.  Musculoskeletal:        General: Normal range of motion.     Cervical back: Normal range of motion and neck Nelson. No edema, erythema or rigidity. No muscular tenderness. Normal range of motion.  Lymphadenopathy:     Head:     Right side of head: No submental or submandibular adenopathy.     Left side of head: No submental or submandibular adenopathy.     Cervical: No cervical adenopathy.  Skin:    General: Skin is warm and dry.     Coloration: Skin is not pale.     Findings: No rash.     Nails: There is no clubbing.  Neurological:     Mental Status: He is alert and oriented to person, place, and time.     Sensory: No sensory deficit.  Psychiatric:        Mood and Affect: Mood normal.        Speech: Speech normal.        Behavior: Behavior normal.     BP 95/63 (BP Location: Left Arm, Patient Position: Sitting, Cuff Size:  Normal)   Pulse 88   Temp 98.4 F (36.9 C) (Oral)   Ht 5\' 10"  (1.778 m)   Wt 128 lb (58.1 kg)   SpO2 91%   BMI 18.37 kg/m  Wt Readings from Last 3 Encounters:  01/16/23 128 lb (58.1 kg)  12/26/22 130 lb (59 kg)  06/05/22 130 lb 3.2 oz (59.1 kg)     Health Maintenance Due  Topic Date Due   COVID-19 Vaccine (3 - Moderna risk series) 06/01/2020   COLON CANCER SCREENING ANNUAL FOBT  08/23/2020    There are no preventive care reminders to display for this patient.  Lab Results  Component Value Date   TSH 1.757 04/25/2019   Lab Results  Component Value Date   WBC 6.2 09/28/2021   HGB 13.6 09/28/2021   HCT 42.8 09/28/2021   MCV 94 09/28/2021   PLT 217 09/28/2021   Lab Results  Component Value Date   NA 146 (H) 06/05/2022   K 4.6 06/05/2022  CO2 34 (H) 06/05/2022   GLUCOSE 65 (L) 06/05/2022   BUN 14 06/05/2022   CREATININE 0.81 06/05/2022   BILITOT 0.4 09/28/2021   ALKPHOS 72 09/28/2021   AST 20 09/28/2021   ALT 13 09/28/2021   PROT 7.4 09/28/2021   ALBUMIN 4.6 09/28/2021   CALCIUM 9.6 06/05/2022   ANIONGAP 10 09/07/2020   EGFR 100 06/05/2022   Lab Results  Component Value Date   CHOL 223 (H) 06/05/2022   Lab Results  Component Value Date   HDL 90 06/05/2022   Lab Results  Component Value Date   LDLCALC 119 (H) 06/05/2022   Lab Results  Component Value Date   TRIG 81 06/05/2022   Lab Results  Component Value Date   CHOLHDL 2.5 06/05/2022   Lab Results  Component Value Date   HGBA1C 5.9 (H) 03/08/2020      Assessment & Plan:   Problem List Items Addressed This Visit       Cardiovascular and Mediastinum   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)    Continue oxygen therapy and as needed furosemide and potassium      Atherosclerosis of aorta (HCC)   Chronic right-sided congestive heart failure (HCC)    As per pulmonary hypertension        Respiratory   COPD GOLD IV  - Primary    Severe COPD  continue inhaled medicines      Chronic respiratory failure with hypoxia and hypercapnia (HCC)    Continue 2 L oxygen        Endocrine   Thyromegaly    Ultrasound of thyroid and thyroid function      Relevant Orders   Thyroid Panel With TSH   US THYROID     Other   Weight loss    Stabilized      Homeless    About to get housing      Other Visit Diagnoses     Acute on chronic diastolic heart failure (HCC)   (Chronic)     Colon cancer screening       Relevant Orders   Fecal occult blood, imunochemical       No orders of the defined types were placed in this encounter.  Colon cancer screening with fecal occult kit Follow-up: Return in about 6 months (around 07/19/2023) for primary care follow up, copd.    Shan Levans, MD

## 2023-01-16 NOTE — Assessment & Plan Note (Signed)
Severe COPD continue inhaled medicines

## 2023-01-16 NOTE — Assessment & Plan Note (Signed)
As per pulmonary hypertension.

## 2023-01-16 NOTE — Assessment & Plan Note (Signed)
 Stabilized.  

## 2023-01-16 NOTE — Assessment & Plan Note (Signed)
Ultrasound of thyroid and thyroid function

## 2023-01-16 NOTE — Assessment & Plan Note (Signed)
About to get housing

## 2023-01-17 ENCOUNTER — Telehealth: Payer: Self-pay

## 2023-01-17 LAB — THYROID PANEL WITH TSH
Free Thyroxine Index: 1.7 (ref 1.2–4.9)
T3 Uptake Ratio: 25 % (ref 24–39)
T4, Total: 6.7 ug/dL (ref 4.5–12.0)
TSH: 0.716 u[IU]/mL (ref 0.450–4.500)

## 2023-01-17 NOTE — Telephone Encounter (Signed)
Pt was called and vm was left, Information has been sent to nurse pool.   

## 2023-01-17 NOTE — Progress Notes (Signed)
Let pt know thyroid function normal  get [place] of thyroid as rx

## 2023-01-17 NOTE — Telephone Encounter (Signed)
-----   Message from Shan Levans sent at 01/17/2023  6:01 AM EDT ----- Let pt know thyroid function normal  get [place] of thyroid as rx

## 2023-04-01 ENCOUNTER — Other Ambulatory Visit: Payer: Self-pay

## 2023-04-01 ENCOUNTER — Other Ambulatory Visit: Payer: Self-pay | Admitting: Critical Care Medicine

## 2023-04-02 ENCOUNTER — Other Ambulatory Visit (HOSPITAL_COMMUNITY): Payer: Self-pay

## 2023-04-02 ENCOUNTER — Other Ambulatory Visit: Payer: Self-pay

## 2023-04-02 MED ORDER — ALBUTEROL SULFATE HFA 108 (90 BASE) MCG/ACT IN AERS
2.0000 | INHALATION_SPRAY | Freq: Four times a day (QID) | RESPIRATORY_TRACT | 2 refills | Status: DC | PRN
  Filled 2023-04-02: qty 18, 25d supply, fill #0
  Filled 2023-05-13: qty 18, 25d supply, fill #1
  Filled 2023-06-27 (×2): qty 18, 25d supply, fill #2

## 2023-04-02 NOTE — Telephone Encounter (Signed)
Requested Prescriptions  Pending Prescriptions Disp Refills   albuterol (VENTOLIN HFA) 108 (90 Base) MCG/ACT inhaler 18 g 2    Sig: Inhale 2 puffs into the lungs every 6 hours as needed for wheezing or shortness of breath.     Pulmonology:  Beta Agonists 2 Passed - 04/01/2023  8:05 AM      Passed - Last BP in normal range    BP Readings from Last 1 Encounters:  01/16/23 95/63         Passed - Last Heart Rate in normal range    Pulse Readings from Last 1 Encounters:  01/16/23 88         Passed - Valid encounter within last 12 months    Recent Outpatient Visits           2 months ago COPD GOLD IV    [place] [facility] Storm Frisk, MD   10 months ago COPD GOLD IV    [facility] Storm Frisk, MD   1 year ago COPD GOLD IV    [facility] Storm Frisk, MD   1 year ago COPD GOLD IV    [facility] Storm Frisk, MD   1 year ago COPD GOLD IV    [facility] Storm Frisk, MD       Future Appointments             In 3 months Hoy Register, MD [facility]

## 2023-04-09 ENCOUNTER — Other Ambulatory Visit: Payer: Self-pay

## 2023-04-09 ENCOUNTER — Encounter (HOSPITAL_COMMUNITY): Payer: Self-pay

## 2023-04-09 ENCOUNTER — Inpatient Hospital Stay (HOSPITAL_COMMUNITY)
Admission: EM | Admit: 2023-04-09 | Discharge: 2023-04-15 | DRG: 189 | Disposition: A | Discharge status: Home / Self Care | Payer: No Typology Code available for payment source | Attending: Internal Medicine | Admitting: Internal Medicine

## 2023-04-09 ENCOUNTER — Emergency Department (HOSPITAL_COMMUNITY): Payer: No Typology Code available for payment source

## 2023-04-09 DIAGNOSIS — J9612 Chronic respiratory failure with hypercapnia: Secondary | ICD-10-CM

## 2023-04-09 DIAGNOSIS — J9621 Acute and chronic respiratory failure with hypoxia: Secondary | ICD-10-CM | POA: Diagnosis present

## 2023-04-09 DIAGNOSIS — Z7982 Long term (current) use of aspirin: Secondary | ICD-10-CM | POA: Diagnosis not present

## 2023-04-09 DIAGNOSIS — J9622 Acute and chronic respiratory failure with hypercapnia: Secondary | ICD-10-CM | POA: Diagnosis present

## 2023-04-09 DIAGNOSIS — Z833 Family history of diabetes mellitus: Secondary | ICD-10-CM

## 2023-04-09 DIAGNOSIS — J441 Chronic obstructive pulmonary disease with (acute) exacerbation: Principal | ICD-10-CM | POA: Diagnosis present

## 2023-04-09 DIAGNOSIS — Z8249 Family history of ischemic heart disease and other diseases of the circulatory system: Secondary | ICD-10-CM | POA: Diagnosis not present

## 2023-04-09 DIAGNOSIS — J9611 Chronic respiratory failure with hypoxia: Secondary | ICD-10-CM | POA: Diagnosis present

## 2023-04-09 DIAGNOSIS — Z681 Body mass index (BMI) 19 or less, adult: Secondary | ICD-10-CM

## 2023-04-09 DIAGNOSIS — Z8051 Family history of malignant neoplasm of kidney: Secondary | ICD-10-CM | POA: Diagnosis not present

## 2023-04-09 DIAGNOSIS — Z23 Encounter for immunization: Secondary | ICD-10-CM

## 2023-04-09 DIAGNOSIS — Z5901 Sheltered homelessness: Secondary | ICD-10-CM

## 2023-04-09 DIAGNOSIS — Z79899 Other long term (current) drug therapy: Secondary | ICD-10-CM | POA: Diagnosis not present

## 2023-04-09 DIAGNOSIS — I959 Hypotension, unspecified: Secondary | ICD-10-CM | POA: Diagnosis present

## 2023-04-09 DIAGNOSIS — K219 Gastro-esophageal reflux disease without esophagitis: Secondary | ICD-10-CM | POA: Diagnosis present

## 2023-04-09 DIAGNOSIS — Z1152 Encounter for screening for COVID-19: Secondary | ICD-10-CM

## 2023-04-09 DIAGNOSIS — Z9981 Dependence on supplemental oxygen: Secondary | ICD-10-CM

## 2023-04-09 DIAGNOSIS — E8729 Other acidosis: Secondary | ICD-10-CM | POA: Diagnosis present

## 2023-04-09 DIAGNOSIS — J449 Chronic obstructive pulmonary disease, unspecified: Secondary | ICD-10-CM | POA: Diagnosis not present

## 2023-04-09 DIAGNOSIS — I2781 Cor pulmonale (chronic): Secondary | ICD-10-CM | POA: Diagnosis present

## 2023-04-09 DIAGNOSIS — R0902 Hypoxemia: Secondary | ICD-10-CM | POA: Diagnosis not present

## 2023-04-09 DIAGNOSIS — R5381 Other malaise: Secondary | ICD-10-CM | POA: Diagnosis present

## 2023-04-09 DIAGNOSIS — R64 Cachexia: Secondary | ICD-10-CM | POA: Diagnosis present

## 2023-04-09 DIAGNOSIS — Z881 Allergy status to other antibiotic agents status: Secondary | ICD-10-CM | POA: Diagnosis not present

## 2023-04-09 DIAGNOSIS — I2729 Other secondary pulmonary hypertension: Secondary | ICD-10-CM | POA: Diagnosis present

## 2023-04-09 DIAGNOSIS — E43 Unspecified severe protein-calorie malnutrition: Secondary | ICD-10-CM | POA: Insufficient documentation

## 2023-04-09 DIAGNOSIS — I5032 Chronic diastolic (congestive) heart failure: Secondary | ICD-10-CM | POA: Diagnosis present

## 2023-04-09 DIAGNOSIS — Z87891 Personal history of nicotine dependence: Secondary | ICD-10-CM | POA: Diagnosis not present

## 2023-04-09 DIAGNOSIS — Z7951 Long term (current) use of inhaled steroids: Secondary | ICD-10-CM | POA: Diagnosis not present

## 2023-04-09 DIAGNOSIS — E785 Hyperlipidemia, unspecified: Secondary | ICD-10-CM | POA: Diagnosis present

## 2023-04-09 DIAGNOSIS — J9601 Acute respiratory failure with hypoxia: Secondary | ICD-10-CM | POA: Diagnosis not present

## 2023-04-09 DIAGNOSIS — Z5900 Homelessness unspecified: Secondary | ICD-10-CM | POA: Diagnosis not present

## 2023-04-09 DIAGNOSIS — R230 Cyanosis: Secondary | ICD-10-CM | POA: Diagnosis not present

## 2023-04-09 DIAGNOSIS — R636 Underweight: Secondary | ICD-10-CM | POA: Diagnosis not present

## 2023-04-09 DIAGNOSIS — I2723 Pulmonary hypertension due to lung diseases and hypoxia: Secondary | ICD-10-CM | POA: Diagnosis not present

## 2023-04-09 DIAGNOSIS — E782 Mixed hyperlipidemia: Secondary | ICD-10-CM | POA: Diagnosis not present

## 2023-04-09 LAB — CREATININE, SERUM
Creatinine, Ser: 0.65 mg/dL (ref 0.61–1.24)
GFR, Estimated: >60 mL/min (ref >60)

## 2023-04-09 LAB — CBC
HCT: 40.4 % (ref 39.0–52.0)
HCT: 39.7 % (ref 39.0–52.0)
Hemoglobin: 12 g/dL — ABNORMAL LOW (ref 13.0–17.0)
Hemoglobin: 11.8 g/dL — ABNORMAL LOW (ref 13.0–17.0)
MCH: 31 pg (ref 26.0–34.0)
MCH: 30.9 pg (ref 26.0–34.0)
MCHC: 29.7 g/dL — ABNORMAL LOW (ref 30.0–36.0)
MCHC: 29.7 g/dL — ABNORMAL LOW (ref 30.0–36.0)
MCV: 104.4 fL — ABNORMAL HIGH (ref 80.0–100.0)
MCV: 103.9 fL — ABNORMAL HIGH (ref 80.0–100.0)
Platelets: 149 10*3/uL — ABNORMAL LOW (ref 150–400)
Platelets: 139 10*3/uL — ABNORMAL LOW (ref 150–400)
RBC: 3.87 MIL/uL — ABNORMAL LOW (ref 4.22–5.81)
RBC: 3.82 MIL/uL — ABNORMAL LOW (ref 4.22–5.81)
RDW: 12.2 % (ref 11.5–15.5)
RDW: 12.1 % (ref 11.5–15.5)
WBC: 7.2 10*3/uL (ref 4.0–10.5)
WBC: 5.9 10*3/uL (ref 4.0–10.5)
nRBC: 0 % (ref 0.0–0.2)
nRBC: 0 % (ref 0.0–0.2)

## 2023-04-09 LAB — SARS CORONAVIRUS 2 BY RT PCR: SARS Coronavirus 2 by RT PCR: NEGATIVE

## 2023-04-09 LAB — CBG MONITORING, ED: Glucose-Capillary: 114 mg/dL — ABNORMAL HIGH (ref 70–99)

## 2023-04-09 LAB — BASIC METABOLIC PANEL
Anion gap: 15 (ref 5–15)
BUN: 21 mg/dL (ref 8–23)
CO2: 40 mmol/L — ABNORMAL HIGH (ref 22–32)
Calcium: 8.6 mg/dL — ABNORMAL LOW (ref 8.9–10.3)
Chloride: 87 mmol/L — ABNORMAL LOW (ref 98–111)
Creatinine, Ser: 0.69 mg/dL (ref 0.61–1.24)
GFR, Estimated: >60 mL/min (ref >60)
Glucose, Bld: 169 mg/dL — ABNORMAL HIGH (ref 70–99)
Potassium: 3.8 mmol/L (ref 3.5–5.1)
Sodium: 142 mmol/L (ref 135–145)

## 2023-04-09 LAB — HIV ANTIBODY (ROUTINE TESTING W REFLEX): HIV Screen 4th Generation wRfx: NONREACTIVE

## 2023-04-09 MED ORDER — SODIUM CHLORIDE 0.9 % IV SOLN
1.0000 g | INTRAVENOUS | Status: DC
  Administered 2023-04-09 – 2023-04-10 (×2): 1 g via INTRAVENOUS
  Filled 2023-04-09 (×3): qty 10

## 2023-04-09 MED ORDER — ZINC 20 MG PO CAPS: ORAL_CAPSULE | Freq: Every day | ORAL | Status: DC

## 2023-04-09 MED ORDER — METHYLPREDNISOLONE SODIUM SUCC 125 MG IJ SOLR
125.0000 mg | Freq: Two times a day (BID) | INTRAMUSCULAR | Status: AC
  Administered 2023-04-09 – 2023-04-10 (×2): 125 mg via INTRAVENOUS
  Filled 2023-04-09 (×2): qty 2

## 2023-04-09 MED ORDER — ALBUTEROL SULFATE (2.5 MG/3ML) 0.083% IN NEBU
10.0000 mg | INHALATION_SOLUTION | Freq: Once | RESPIRATORY_TRACT | Status: AC
  Administered 2023-04-09: 10 mg via RESPIRATORY_TRACT
  Filled 2023-04-09: qty 12

## 2023-04-09 MED ORDER — ENOXAPARIN SODIUM 40 MG/0.4ML IJ SOSY
40.0000 mg | PREFILLED_SYRINGE | INTRAMUSCULAR | Status: DC
  Administered 2023-04-09 – 2023-04-14 (×6): 40 mg via SUBCUTANEOUS
  Filled 2023-04-09 (×7): qty 0.4

## 2023-04-09 MED ORDER — ROSUVASTATIN CALCIUM 20 MG PO TABS
40.0000 mg | ORAL_TABLET | Freq: Every day | ORAL | Status: DC
  Administered 2023-04-09 – 2023-04-15 (×7): 40 mg via ORAL
  Filled 2023-04-09 (×7): qty 2

## 2023-04-09 MED ORDER — POTASSIUM CHLORIDE CRYS ER 20 MEQ PO TBCR: 20.0000 meq | EXTENDED_RELEASE_TABLET | Freq: Every day | ORAL | Status: DC | PRN

## 2023-04-09 MED ORDER — ASPIRIN 81 MG PO TBEC
81.0000 mg | DELAYED_RELEASE_TABLET | Freq: Every day | ORAL | Status: DC
  Administered 2023-04-09 – 2023-04-15 (×7): 81 mg via ORAL
  Filled 2023-04-09 (×7): qty 1

## 2023-04-09 MED ORDER — VITAMIN C 100 MG PO TABS: 100.0000 mg | ORAL_TABLET | Freq: Every day | ORAL | Status: DC

## 2023-04-09 MED ORDER — ALBUTEROL SULFATE (2.5 MG/3ML) 0.083% IN NEBU
3.0000 mL | INHALATION_SOLUTION | Freq: Four times a day (QID) | RESPIRATORY_TRACT | Status: DC | PRN
  Administered 2023-04-14: 3 mL via RESPIRATORY_TRACT
  Filled 2023-04-09: qty 3

## 2023-04-09 MED ORDER — IPRATROPIUM BROMIDE 0.02 % IN SOLN
0.5000 mg | Freq: Once | RESPIRATORY_TRACT | Status: AC
  Administered 2023-04-09: 0.5 mg via RESPIRATORY_TRACT
  Filled 2023-04-09: qty 2.5

## 2023-04-09 MED ORDER — ARFORMOTEROL TARTRATE 15 MCG/2ML IN NEBU
15.0000 ug | INHALATION_SOLUTION | Freq: Two times a day (BID) | RESPIRATORY_TRACT | Status: DC
  Administered 2023-04-10 – 2023-04-14 (×10): 15 ug via RESPIRATORY_TRACT
  Filled 2023-04-09 (×11): qty 2

## 2023-04-09 MED ORDER — DM-GUAIFENESIN ER 30-600 MG PO TB12: 1.0000 | ORAL_TABLET | Freq: Two times a day (BID) | ORAL | Status: DC | PRN

## 2023-04-09 MED ORDER — FAMOTIDINE 20 MG PO TABS
20.0000 mg | ORAL_TABLET | Freq: Two times a day (BID) | ORAL | Status: DC | PRN
  Administered 2023-04-13: 20 mg via ORAL
  Filled 2023-04-09: qty 1

## 2023-04-09 MED ORDER — UMECLIDINIUM BROMIDE 62.5 MCG/ACT IN AEPB
1.0000 | INHALATION_SPRAY | Freq: Every day | RESPIRATORY_TRACT | Status: DC
  Administered 2023-04-10 – 2023-04-14 (×3): 1 via RESPIRATORY_TRACT
  Filled 2023-04-09 (×3): qty 7

## 2023-04-09 MED ORDER — SALINE SPRAY 0.65 % NA SOLN
2.0000 | Freq: Three times a day (TID) | NASAL | Status: DC | PRN
  Filled 2023-04-09: qty 44

## 2023-04-09 MED ORDER — PREDNISONE 20 MG PO TABS
40.0000 mg | ORAL_TABLET | Freq: Every day | ORAL | Status: AC
  Administered 2023-04-10 – 2023-04-13 (×4): 40 mg via ORAL
  Filled 2023-04-09 (×5): qty 2

## 2023-04-09 MED ORDER — ALBUTEROL SULFATE (2.5 MG/3ML) 0.083% IN NEBU: 2.5000 mg | INHALATION_SOLUTION | RESPIRATORY_TRACT | Status: DC | PRN

## 2023-04-09 MED ORDER — DOCUSATE SODIUM 100 MG PO CAPS
100.0000 mg | ORAL_CAPSULE | Freq: Every day | ORAL | Status: DC
  Administered 2023-04-09 – 2023-04-15 (×7): 100 mg via ORAL
  Filled 2023-04-09 (×7): qty 1

## 2023-04-09 MED ORDER — VITAMIN D 25 MCG (1000 UNIT) PO TABS
2000.0000 [IU] | ORAL_TABLET | Freq: Every day | ORAL | Status: DC
  Administered 2023-04-09 – 2023-04-15 (×7): 2000 [IU] via ORAL
  Filled 2023-04-09 (×7): qty 2

## 2023-04-09 MED ORDER — VITAMIN B-12 100 MCG PO TABS
100.0000 ug | ORAL_TABLET | Freq: Every day | ORAL | Status: DC
  Administered 2023-04-09 – 2023-04-15 (×7): 100 ug via ORAL
  Filled 2023-04-09 (×7): qty 1

## 2023-04-09 MED ORDER — FUROSEMIDE 20 MG PO TABS: 20.0000 mg | ORAL_TABLET | Freq: Every day | ORAL | Status: DC | PRN

## 2023-04-09 MED ORDER — ALBUTEROL SULFATE HFA 108 (90 BASE) MCG/ACT IN AERS: 2.0000 | INHALATION_SPRAY | Freq: Four times a day (QID) | RESPIRATORY_TRACT | Status: DC | PRN

## 2023-04-09 NOTE — ED Provider Notes (Signed)
[place] EMERGENCY DEPARTMENT AT [facility] Provider Note   CSN: [id number] Arrival date & time: 04/09/23  1419     History {Add pertinent medical, surgical, social history, OB history to HPI:1} No chief complaint on file.   Anthony Nelson is a 62 y.o. male.  62 year old male present emergency department for shortness of breath.  History of COPD not on home oxygen.  Had reportedly near syncopal episode with worsening shortness of breath which prompted EMS call.  Received 125 Solu-Medrol, and 10 mg albuterol, 1 mg Atrovent.  Reports breathing improved.        Home Medications Prior to Admission medications   Medication Sig Start Date End Date Taking? Authorizing Provider  albuterol (PROVENTIL) (2.5 MG/3ML) 0.083% nebulizer solution USE 3 MLS BY NEBULIZATION EVERY 6 HOURS AS NEEDED FOR WHEEZING OR SHORTNESS OF BREATH. 06/05/22 06/05/23  Storm Frisk, MD  albuterol (VENTOLIN HFA) 108 (90 Base) MCG/ACT inhaler Inhale 2 puffs into the lungs every 6 hours as needed for wheezing or shortness of breath. 04/02/23   Storm Frisk, MD  Ascorbic Acid (VITAMIN C) 100 MG tablet Take 1 tablet (100 mg total) by mouth daily. 09/28/21   Storm Frisk, MD  aspirin EC 81 MG tablet Take 1 tablet (81 mg total) by mouth daily. Swallow whole. 09/28/21   Storm Frisk, MD  Cholecalciferol (VITAMIN D3) 50 MCG (2000 UT) TABS Take 2,000 Units by mouth daily. 09/28/21   Storm Frisk, MD  Cyanocobalamin (B-12 PO) Take 1 tablet by mouth daily.    [provider]  dextromethorphan-guaiFENesin (MUCINEX DM) 30-600 MG 12hr tablet Take 1 tablet by mouth 2 (two) times daily as needed for cough. 09/28/21   Storm Frisk, MD  docusate sodium (COLACE) 100 MG capsule Take 1 capsule (100 mg total) by mouth daily. 09/28/21   Storm Frisk, MD  famotidine (PEPCID) 20 MG tablet TAKE 1 TABLET (20 MG TOTAL) BY MOUTH 2 (TWO) TIMES DAILY. 06/05/22 06/05/23  Storm Frisk, MD  furosemide  (LASIX) 20 MG tablet Take 1 tablet (20 mg total) by mouth daily as needed. 09/28/21 09/28/22  Storm Frisk, MD  Glycopyrrolate-Formoterol (BEVESPI AEROSPHERE) 9-4.8 MCG/ACT AERO Inhale 1 puff into the lungs in the morning and at bedtime. 06/05/22   Storm Frisk, MD  Multiple Vitamins-Minerals (ZINC PO) Take 1 tablet by mouth daily.    [provider]  OXYGEN Inhale 2 L/min into the lungs continuous.     [provider]  potassium chloride SA (KLOR-CON M) 20 MEQ tablet Take 1 tablet (20 mEq total) by mouth daily as needed. 09/28/21 09/28/22  Storm Frisk, MD  rosuvastatin (CRESTOR) 40 MG tablet Take 1 tablet (40 mg) by mouth daily. 06/05/22 06/05/23  Storm Frisk, MD  sodium chloride (OCEAN) 0.65 % SOLN nasal spray Place 2 sprays into both nostrils in the morning, at noon, and at bedtime. 12/05/20   Storm Frisk, MD      Allergies    Erythromycin    Review of Systems   Review of Systems  Physical Exam Updated Vital Signs There were no vitals taken for this visit. Physical Exam Vitals and nursing note reviewed.  Constitutional:      General: He is not in acute distress.    Appearance: He is not toxic-appearing.  HENT:     Head: Normocephalic.     Nose: Nose normal.     Mouth/Throat:     Mouth: Mucous  membranes are moist.  Eyes:     Conjunctiva/sclera: Conjunctivae normal.  Cardiovascular:     Rate and Rhythm: Normal rate and regular rhythm.  Pulmonary:     Effort: Pulmonary effort is normal.     Breath sounds: Normal breath sounds.  Abdominal:     General: There is no distension.     Tenderness: There is no abdominal tenderness. There is no guarding or rebound.  Musculoskeletal:     Right lower leg: No edema.     Left lower leg: No edema.  Skin:    General: Skin is warm.     Capillary Refill: Capillary refill takes less than 2 seconds.  Neurological:     Mental Status: He is alert and oriented to person, place, and time.  Psychiatric:         Mood and Affect: Mood normal.        Behavior: Behavior normal.     ED Results / Procedures / Treatments   Labs (all labs ordered are listed, but only abnormal results are displayed) Labs Reviewed  CBC  BASIC METABOLIC PANEL    EKG None  Radiology No results found.  Procedures Procedures  {Document cardiac monitor, telemetry assessment procedure when appropriate:1}  Medications Ordered in ED Medications  albuterol (PROVENTIL) (2.5 MG/3ML) 0.083% nebulizer solution 10 mg (has no administration in time range)  ipratropium (ATROVENT) nebulizer solution 0.5 mg (has no administration in time range)    ED Course/ Medical Decision Making/ A&P   {   Click here for ABCD2, HEART and other calculatorsREFRESH Note before signing :1}                              Medical Decision Making 62 year old male presenting emergency department in respiratory distress.  Reportedly hypoxic with EMS down in the 60s.  Improved with nonrebreather, Solu-Medrol and milligrams albuterol, 1 mg Atrovent.  Patient appears to be in moderate respiratory distress with assessor muscle use, poor air movement.  Amount and/or Complexity of Data Reviewed Labs: ordered. Radiology: ordered.  Risk Prescription drug management.   ***  {Document critical care time when appropriate:1} {Document review of labs and clinical decision tools ie heart score, Chads2Vasc2 etc:1}  {Document your independent review of radiology images, and any outside records:1} {Document your discussion with family members, caretakers, and with consultants:1} {Document social determinants of health affecting pt's care:1} {Document your decision making why or why not admission, treatments were needed:1} Final Clinical Impression(s) / ED Diagnoses Final diagnoses:  None    Rx / DC Orders ED Discharge Orders     None

## 2023-04-09 NOTE — ED Notes (Signed)
Pt difficult to arouse. Pt responded to sternal rub. MD notified and at bedside.

## 2023-04-09 NOTE — ED Triage Notes (Addendum)
Pt BIB [facility] from home d/t initial call out as AMS, upon arrival pt was 54% on RA. He was placed on NRB & his O2 was then 88% & was 100% & sustained that until arrival to ED. While en route to ED he was given 10 mg Albuterol, 1 mg Atrovent, DuoNeb & Solumedrol in a 20g Lt FA PIV. Pt reports he just felt like he was dozing off to sleep & can usually go without his baseline 2L home O2 via n/c "4-5 hrs at a time" (per pt). EMS reports he had diminished inspiratory LS after all meds with absent expiratory LS. A/Ox4, upon arrival & pt denies any CP upon arrival. BP was 104/60 & pulse was 98 bpm. Hx of COPD.

## 2023-04-09 NOTE — ED Provider Notes (Addendum)
  Physical Exam  BP 117/78   Pulse 95   Temp 98.4 F (36.9 C) (Oral)   Resp (!) 24   Ht 6' (1.829 m)   Wt 59 kg   SpO2 100%   BMI 17.63 kg/m   Physical Exam  Procedures  Procedures  ED Course / MDM   Clinical Course as of 04/09/23 1747  Tue Apr 09, 2023  1542 Patient reevaluated, reports he is feels like his normal self and feels good enough to go home.  Initially reported by EMS not on oxygen, however patient states that he is not been on 2 L of oxygen for years.  Patient is saturating well in bed.  Lung sounds improved.  Will trial ambulation. [TY]    Clinical Course User Index [TY] Coral Spikes, DO   Medical Decision Making Amount and/or Complexity of Data Reviewed Labs: ordered. Radiology: ordered. ECG/medicine tests: ordered.  Risk Prescription drug management. Decision regarding hospitalization.   Received in signout.  History of COPD with shortness of breath.  Has had steroids and breathing treatments.  However still hypoxic with ambulation.  Sats with ambulation went down to 75%.  Will discuss with unassigned medicine for admission  ABG did show evidence of CO2 retention.  Started on BiPAP.  Informed hospitalist.       Benjiman Core, MD 04/09/23 Evette Doffing    Benjiman Core, MD 04/09/23 [phone]

## 2023-04-09 NOTE — ED Notes (Signed)
X-ray at bedside

## 2023-04-09 NOTE — ED Notes (Addendum)
Pulse ox while ambulating ranged from 82%-87% while on 2L of O2. When patient got back into bed, his O2 sat dropped to 75%. His pulse ox went up to 85% after 2 minutes. Patient's O2 is now at 90% on 3L. RN notified.

## 2023-04-09 NOTE — ED Notes (Signed)
Pt transitioned off of bipap; placed on baseline of 2L. Oxygen sats remained at 95-100% during conversation. Water and meal given

## 2023-04-09 NOTE — Progress Notes (Signed)
Critical ABG values reported to Dr. Rubin Payor. Patient started on Bipap. Per MD, sat goal 88% due to history of COPD.

## 2023-04-09 NOTE — H&P (Signed)
History and Physical    Patient: Anthony Nelson ZOX:[id number] DOB: [birth year] DOA: 04/09/2023 DOS: the patient was seen and examined on 04/09/2023 PCP: Storm Frisk, MD  Patient coming from: Home  Chief Complaint:  Chief Complaint  Patient presents with   Respiratory Distress   HPI: Anthony Nelson is a 62 y.o. male with medical history significant of COPD, diastolic heart failure, GERD, pulmonary hypertension, who presents to the ER with worsening shortness of breath.  Patient is supposed to be on 2 L of oxygen at home at baseline.  He also follows with pulmonology for his advanced COPD.  AMS wife called.  On arrival his oxygen sats was 54% on room air.  They placed on nonrebreather back oxygen sat went as high as 88% and then 100%.  In the ED he was given 10 mg of albuterol and 1 mg Atrovent.  Also Solu-Medrol.  Oxygen sats continued to be maintained but he continues to have significant wheezing.  ABG showed severe hypercarbia with pH of 7.2.  Patient has been placed on BiPAP now.  Patient has remained hypoxic.  He has been admitted for further evaluation treatment.  Review of Systems: As mentioned in the history of present illness. All other systems reviewed and are negative. 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 His smoking use included cigarettes. He started smoking about 46 years ago. He has a 4.4 pack-year smoking history. He has never used smokeless tobacco. He reports that he does not currently use  alcohol. He reports that he does not use drugs.  Allergies  Allergen Reactions   Erythromycin Other (See Comments)    Allergy from childhood- does not take it    Family History  Problem Relation Age of Onset   Cancer Mother    Cancer Father    CAD Father    Heart failure Father    Diabetes Maternal Grandmother    Kidney disease Maternal Grandmother    Kidney cancer Paternal Grandmother    Heart attack Paternal Grandfather     Prior to Admission medications   Medication Sig Start Date End Date Taking? Authorizing Provider  albuterol (PROVENTIL) (2.5 MG/3ML) 0.083% nebulizer solution USE 3 MLS BY NEBULIZATION EVERY 6 HOURS AS NEEDED FOR WHEEZING OR SHORTNESS OF BREATH. 06/05/22 06/05/23 Yes Storm Frisk, MD  albuterol (VENTOLIN HFA) 108 (90 Base) MCG/ACT inhaler Inhale 2 puffs into the lungs every 6 hours as needed for wheezing or shortness of breath. 04/02/23  Yes Storm Frisk, MD  Ascorbic Acid (VITAMIN C) 100 MG tablet Take 1 tablet (100 mg total) by mouth daily. 09/28/21  Yes Storm Frisk, MD  aspirin EC 81 MG tablet Take 1 tablet (81 mg total) by mouth daily. Swallow whole. 09/28/21  Yes Storm Frisk, MD  Cholecalciferol (VITAMIN D3) 50 MCG (2000 UT) TABS Take 2,000 Units by mouth daily. 09/28/21  Yes Storm Frisk, MD  Cyanocobalamin (B-12 PO) Take 1 tablet by mouth daily.   Yes  [provider]  dextromethorphan-guaiFENesin (MUCINEX DM) 30-600 MG 12hr tablet Take 1 tablet by mouth 2 (two) times daily as needed for cough. 09/28/21  Yes Storm Frisk, MD  docusate sodium (COLACE) 100 MG capsule Take 1 capsule (100 mg total) by mouth daily. 09/28/21  Yes Storm Frisk, MD  famotidine (PEPCID) 20 MG tablet TAKE 1 TABLET (20 MG TOTAL) BY MOUTH 2 (TWO) TIMES DAILY. Patient taking differently: Take 20 mg by mouth 2 (two) times daily as needed for heartburn or indigestion. 06/05/22 06/05/23 Yes Storm Frisk, MD  furosemide (LASIX) 20 MG tablet Take 1  tablet (20 mg total) by mouth daily as needed. Patient taking differently: Take 20 mg by mouth daily as needed for fluid. 09/28/21 04/09/23 Yes Storm Frisk, MD  Glycopyrrolate-Formoterol (BEVESPI AEROSPHERE) 9-4.8 MCG/ACT AERO Inhale 1 puff into the lungs in the morning and at bedtime. 06/05/22  Yes Storm Frisk, MD  Multiple Vitamins-Minerals (ZINC PO) Take 1 tablet by mouth daily.   Yes [provider]  OXYGEN Inhale 2 L/min into the lungs continuous.    Yes [provider]  potassium chloride SA (KLOR-CON M) 20 MEQ tablet Take 1 tablet (20 mEq total) by mouth daily as needed. Patient taking differently: Take 20 mEq by mouth daily as needed (low potassium). 09/28/21 04/09/23 Yes Storm Frisk, MD  rosuvastatin (CRESTOR) 40 MG tablet Take 1 tablet (40 mg) by mouth daily. 06/05/22 06/05/23 Yes Storm Frisk, MD  sodium chloride (OCEAN) 0.65 % SOLN nasal spray Place 2 sprays into both nostrils in the morning, at noon, and at bedtime. Patient taking differently: Place 2 sprays into both nostrils 3 (three) times daily as needed for congestion. 12/05/20  Yes Storm Frisk, MD    Physical Exam: Vitals:   04/09/23 1700 04/09/23 1815 04/09/23 1824 04/09/23 1853  BP: 117/78 114/74    Pulse: 95 91  83  Resp: (!) 24 (!) 25  18  Temp:   98.1 F (36.7 C)   TempSrc:   Oral   SpO2: 100% 100%  97%  Weight:      Height:       Constitutional: Acutely ill looking, in obvious respiratory distress calm, comfortable Eyes: PERRL, lids and conjunctivae normal ENMT: Mucous membranes are moist. Posterior pharynx clear of any exudate or lesions.Normal dentition.  Neck: normal, supple, no masses, no thyromegaly Respiratory: Decreased air entry bilaterally with expiratory wheezing use of accessory muscles..  Cardiovascular: Sinus tachycardia, no murmurs / rubs / gallops. No extremity edema. 2+ pedal pulses. No carotid bruits.  Abdomen: no tenderness, no masses palpated. No  hepatosplenomegaly. Bowel sounds positive.  Musculoskeletal: Good range of motion, no joint swelling or tenderness, Skin: no rashes, lesions, ulcers. No induration Neurologic: CN 2-12 grossly intact. Sensation intact, DTR normal. Strength 5/5 in all 4.  Psychiatric: Normal judgment and insight. Alert and oriented x 3. Normal mood  Data Reviewed:  Sodium 142 potassium 3.8, chloride 87, CO2 40, glucose 169 calcium 8.6 hemoglobin 12.0 and platelets 149 chest x-rays show COPD EKG shows sinus rhythm.  COVID-19 is negative.  Assessment and Plan:  #1 acute exacerbation of COPD:  currently on BiPAP.  Initiate IV steroid, on nebulizer, antibiotics.  Patient will continue to be titrated down from the oxygen.  No chest pain.  #2 cor pulmonale: Secondary to pulmonary hypertension.  #3 acute on chronic respiratory failure with hypoxia and hypercarbia: Continue with BiPAP, steroids and breathing treatment  #4 GERD: Continue PPIs  #  5 chronic diastolic heart failure: Appears compensated.  Continue home regimen.  #6 hyperlipidemia: Continue statin    Advance Care Planning:   Code Status: Prior full code  Consults: None  Family Communication: No family at bedside  Severity of Illness: The appropriate patient status for this patient is INPATIENT. Inpatient status is judged to be reasonable and necessary in order to provide the required intensity of service to ensure the patient's safety. The patient's presenting symptoms, physical exam findings, and initial radiographic and laboratory data in the context of their chronic comorbidities is felt to place them at high risk for further clinical deterioration. Furthermore, it is not anticipated that the patient will be medically stable for discharge from the hospital within 2 midnights of admission.   * I certify that at the point of admission it is my clinical judgment that the patient will require inpatient hospital care spanning beyond 2 midnights from  the point of admission due to high intensity of service, high risk for further deterioration and high frequency of surveillance required.*  AuthorLonia Blood, MD 04/09/2023 7:03 PM  For on call review [email or web address].

## 2023-04-10 DIAGNOSIS — I2781 Cor pulmonale (chronic): Secondary | ICD-10-CM | POA: Diagnosis not present

## 2023-04-10 DIAGNOSIS — J9601 Acute respiratory failure with hypoxia: Secondary | ICD-10-CM | POA: Diagnosis not present

## 2023-04-10 DIAGNOSIS — Z681 Body mass index (BMI) 19 or less, adult: Secondary | ICD-10-CM

## 2023-04-10 DIAGNOSIS — J441 Chronic obstructive pulmonary disease with (acute) exacerbation: Secondary | ICD-10-CM | POA: Diagnosis not present

## 2023-04-10 DIAGNOSIS — E782 Mixed hyperlipidemia: Secondary | ICD-10-CM

## 2023-04-10 DIAGNOSIS — R636 Underweight: Secondary | ICD-10-CM

## 2023-04-10 DIAGNOSIS — Z87891 Personal history of nicotine dependence: Secondary | ICD-10-CM

## 2023-04-10 DIAGNOSIS — I5032 Chronic diastolic (congestive) heart failure: Secondary | ICD-10-CM | POA: Diagnosis not present

## 2023-04-10 LAB — COMPREHENSIVE METABOLIC PANEL
ALT: 15 U/L (ref 0–44)
AST: 19 U/L (ref 15–41)
Albumin: 3.2 g/dL — ABNORMAL LOW (ref 3.5–5.0)
Alkaline Phosphatase: 41 U/L (ref 38–126)
Anion gap: 8 (ref 5–15)
BUN: 16 mg/dL (ref 8–23)
CO2: 44 mmol/L — ABNORMAL HIGH (ref 22–32)
Calcium: 8.7 mg/dL — ABNORMAL LOW (ref 8.9–10.3)
Chloride: 87 mmol/L — ABNORMAL LOW (ref 98–111)
Creatinine, Ser: 0.74 mg/dL (ref 0.61–1.24)
GFR, Estimated: >60 mL/min (ref >60)
Glucose, Bld: 126 mg/dL — ABNORMAL HIGH (ref 70–99)
Potassium: 4.5 mmol/L (ref 3.5–5.1)
Sodium: 139 mmol/L (ref 135–145)
Total Bilirubin: 0.1 mg/dL — ABNORMAL LOW (ref 0.3–1.2)
Total Protein: 6.3 g/dL — ABNORMAL LOW (ref 6.5–8.1)

## 2023-04-10 LAB — CBC
HCT: 38.3 % — ABNORMAL LOW (ref 39.0–52.0)
Hemoglobin: 11.5 g/dL — ABNORMAL LOW (ref 13.0–17.0)
MCH: 31 pg (ref 26.0–34.0)
MCHC: 30 g/dL (ref 30.0–36.0)
MCV: 103.2 fL — ABNORMAL HIGH (ref 80.0–100.0)
Platelets: 144 10*3/uL — ABNORMAL LOW (ref 150–400)
RBC: 3.71 MIL/uL — ABNORMAL LOW (ref 4.22–5.81)
RDW: 12.1 % (ref 11.5–15.5)
WBC: 5.6 10*3/uL (ref 4.0–10.5)
nRBC: 0 % (ref 0.0–0.2)

## 2023-04-10 NOTE — Plan of Care (Signed)

## 2023-04-10 NOTE — Progress Notes (Signed)
Progress Note   Patient: Anthony Nelson MVH:[id number] DOB: May 03, 1961 DOA: 04/09/2023     1 DOS: the patient was seen and examined on 04/10/2023   Brief hospital course: Anthony Nelson is a 62 y.o. male with medical history significant of COPD, diastolic heart failure, GERD, pulmonary hypertension, who presents to the ER with worsening shortness of breath.  In the ED he had low oxygen saturation 54% on room air, ABG showed hypercarbia, placed on BiPAP, got DuoNebs IV steroids.  Patient is admitted to hospitalist service for further management evaluation of COPD exacerbation  Assessment and Plan: COPD exacerbation Acute on chronic hypoxic and hypercapnic respiratory failure Patient will be tapered oral steroids, nebulizer treatment. Continue IV rocephin. Taper supplemental oxygen to his baseline 2 to 3 L O2 via nasal cannula. Continue Brovana, incruse ellipta. Continue antitussives.  GERD continue PPI.  Chronic diastolic heart failure: Euvolemic. Continue home 20mg  Lasix dose.  Hyperlipidemia: Continue statin therapy.  Severe malnutrition- BMI 17.63 Dietician evaluation. Encourage oral diet, supplements.  Out of bed to chair. Incentive spirometry. Nursing supportive care. Fall, aspiration precautions. DVT prophylaxis   Code Status: Full Code  Subjective: Patient is seen and examined today morning. He is currently on 2 L supplemental oxygen.  Patient got desaturated while working with PT.  He is eating fair denies any complaints.  Physical Exam: Vitals:   04/10/23 0900 04/10/23 0917 04/10/23 1200 04/10/23 1355  BP:   101/60 110/74  Pulse: 96  86 89  Resp: (!) 24  (!) 22 18  Temp:  98 F (36.7 C) 98 F (36.7 C) 98.5 F (36.9 C)  TempSrc:  Oral Oral Oral  SpO2: 90%  93% 97%  Weight:      Height:        General - Elderly elderly thin built African-American male, mild respiratory distress HEENT - PERRLA, EOMI, atraumatic head, non tender sinuses. Lung - Clear, bibasilar  rales, rhonchi, wheezes. Heart - S1, S2 heard, no murmurs, rubs, no pedal edema. Abdomen - Soft, non tender nondistended, bowel sounds good Neuro - Alert, awake and oriented x 3, non focal exam. Skin - Warm and dry.  Data Reviewed:      Latest Ref Rng & Units 04/10/2023    6:07 AM 04/09/2023   10:46 PM 04/09/2023    2:29 PM  CBC  WBC 4.0 - 10.5 K/uL 5.6  5.9  7.2   Hemoglobin 13.0 - 17.0 g/dL 84.1  32.4  40.1   Hematocrit 39.0 - 52.0 % 38.3  39.7  40.4   Platelets 150 - 400 K/uL 144  139  149       Latest Ref Rng & Units 04/10/2023    6:07 AM 04/09/2023    9:17 PM 04/09/2023    2:29 PM  BMP  Glucose 70 - 99 mg/dL 027   253   BUN 8 - 23 mg/dL 16   21   Creatinine 6.64 - 1.24 mg/dL 4.03  4.74  2.59   Sodium 135 - 145 mmol/L 139   142   Potassium 3.5 - 5.1 mmol/L 4.5   3.8   Chloride 98 - 111 mmol/L 87   87   CO2 22 - 32 mmol/L 44   40   Calcium 8.9 - 10.3 mg/dL 8.7   8.6    DG Chest Portable 1 View  Result Date: 04/09/2023 CLINICAL DATA:  COPD.  Altered mental status. EXAM: PORTABLE CHEST 1 VIEW COMPARISON:  09/03/2020. FINDINGS: Bilateral lungs appear hyperexpanded and  hyperlucent with coarse bronchovascular markings, in keeping with COPD. Bilateral lungs otherwise appear clear. No dense consolidation. Bilateral costophrenic angles are clear. Normal cardio-mediastinal silhouette. No acute osseous abnormalities. The soft tissues are within normal limits. IMPRESSION: 1. COPD. No active cardiopulmonary disease. Electronically Signed   By: Jules Schick M.D.   On: 04/09/2023 15:30     Family Communication: Discussed patient, he understands and agrees. All questions answereed.    Disposition: Status is: Inpatient Remains inpatient appropriate because: COPD treatment, hypoxia with exertion  Planned Discharge Destination: Home with Home Health     Time spent: 40 minutes  Author: Marcelino Duster, MD 04/10/2023 3:01 PM Secure chat 7am to 7pm For on call review  [email or web address].

## 2023-04-10 NOTE — ED Notes (Signed)
ED TO INPATIENT HANDOFF REPORT  ED Nurse Name and Phone #: Dahlia Client [phone]  S Name/Age/Gender Anthony Nelson 62 y.o. male Room/Bed: 020C/020C  Code Status   Code Status: Full Code  Home/SNF/Other Home Patient oriented to: self, place, time, and situation Is this baseline? Yes   Triage Complete: Triage complete  Chief Complaint COPD exacerbation (HCC) [J44.1]  Triage Note Pt BIB [facility] from home d/t initial call out as AMS, upon arrival pt was 54% on RA. He was placed on NRB & his O2 was then 88% & was 100% & sustained that until arrival to ED. While en route to ED he was given 10 mg Albuterol, 1 mg Atrovent, DuoNeb & Solumedrol in a 20g Lt FA PIV. Pt reports he just felt like he was dozing off to sleep & can usually go without his baseline 2L home O2 via n/c "4-5 hrs at a time" (per pt). EMS reports he had diminished inspiratory LS after all meds with absent expiratory LS. A/Ox4, upon arrival & pt denies any CP upon arrival. BP was 104/60 & pulse was 98 bpm. Hx of COPD.   Allergies Allergies  Allergen Reactions   Erythromycin Other (See Comments)    Allergy from childhood- does not take it    Level of Care/Admitting Diagnosis ED Disposition     ED Disposition  Admit   Condition  --   Comment  Hospital Area: [facility] [[id number]]  Level of Care: Telemetry Medical [[id number]]  May admit patient to [facility] or [facility] if equivalent level of care is available:: No  Covid Evaluation: Asymptomatic - no recent exposure (last 10 days) testing not required  Diagnosis: COPD exacerbation [facility]) [[id number]]  Admitting Physician: Rometta Emery [[id number]]  Attending Physician: Rometta Emery [[id number]]  Certification:: I certify this patient will need inpatient services for at least 2 midnights  Expected Medical Readiness: 04/11/2023          B Medical/Surgery History Past Medical History:  Diagnosis Date   Atherosclerosis of aorta (HCC) 04/06/2020   Bronchitis     COPD (chronic obstructive pulmonary disease) (HCC)    Diastolic heart failure (HCC)    GERD (gastroesophageal reflux disease)    Pulmonary hypertension (HCC)    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   Respiratory failure with hypoxia [facility])    Past Surgical History:  Procedure Laterality Date   HERNIA REPAIR       A IV Location/Drains/Wounds Patient Lines/Drains/Airways Status     Active Line/Drains/Airways     Name Placement date Placement time Site Days   Peripheral IV 04/09/23 20 G Anterior;Left Forearm 04/09/23  1506  Forearm  1   Peripheral IV 04/09/23 20 G 1" Right Hand 04/09/23  2100  Hand  1            Intake/Output Last 24 hours  Intake/Output Summary (Last 24 hours) at 04/10/2023 0848 Last data filed at 04/09/2023 2326 Gross per 24 hour  Intake 100 ml  Output 300 ml  Net -200 ml    Labs/Imaging Results for orders placed or performed during the hospital encounter of 04/09/23 (from the past 48 hour(s))  CBC     Status: Abnormal   Collection Time: 04/09/23  2:29 PM  Result Value Ref Range   WBC 7.2 4.0 - 10.5 K/uL   RBC 3.87 (L) 4.22 - 5.81 MIL/uL   Hemoglobin 12.0 (L) 13.0 - 17.0 g/dL   HCT 95.2 84.1 -  52.0 %   MCV 104.4 (H) 80.0 - 100.0 fL   MCH 31.0 26.0 - 34.0 pg   MCHC 29.7 (L) 30.0 - 36.0 g/dL   RDW 40.9 81.1 - 91.4 %   Platelets 149 (L) 150 - 400 K/uL   nRBC 0.0 0.0 - 0.2 %    Comment: Performed at [facility] Lab, [address], [place], [place] [address]  Basic metabolic panel     Status: Abnormal   Collection Time: 04/09/23  2:29 PM  Result Value Ref Range   Sodium 142 135 - 145 mmol/L   Potassium 3.8 3.5 - 5.1 mmol/L   Chloride 87 (L) 98 - 111 mmol/L   CO2 40 (H) 22 - 32 mmol/L   Glucose, Bld 169 (H) 70 - 99 mg/dL    Comment: Glucose reference range applies only to samples taken after fasting for at least 8 hours.   BUN 21 8 - 23 mg/dL   Creatinine, Ser 6.21 0.61 - 1.24 mg/dL   Calcium  8.6 (L) 8.9 - 10.3 mg/dL   GFR, Estimated >30 >86 mL/min    Comment: (NOTE) Calculated using the CKD-EPI Creatinine Equation (2021)    Anion gap 15 5 - 15    Comment: Performed at [facility], [address] [place], [place] [address]  SARS Coronavirus 2 by RT PCR (hospital order, performed in [facility] hospital lab) *cepheid single result test* Anterior Nasal Swab     Status: None   Collection Time: 04/09/23  5:48 PM   Specimen: Anterior Nasal Swab  Result Value Ref Range   SARS Coronavirus 2 by RT PCR NEGATIVE NEGATIVE    Comment: Performed at [facility], [address] [place], [place] [address]  CBG monitoring, ED     Status: Abnormal   Collection Time: 04/09/23  6:12 PM  Result Value Ref Range   Glucose-Capillary 114 (H) 70 - 99 mg/dL    Comment: Glucose reference range applies only to samples taken after fasting for at least 8 hours.   Comment 1 Call MD NNP PA CNM    Comment 2 Document in Chart   HIV Antibody (routine testing w rflx)     Status: None   Collection Time: 04/09/23  9:17 PM  Result Value Ref Range   HIV Screen 4th Generation wRfx Non Reactive Non Reactive    Comment: Performed at [facility] Lab, [address], [place], [place] [address]  Creatinine, serum     Status: None   Collection Time: 04/09/23  9:17 PM  Result Value Ref Range   Creatinine, Ser 0.65 0.61 - 1.24 mg/dL   GFR, Estimated >24 >40 mL/min    Comment: (NOTE) Calculated using the CKD-EPI Creatinine Equation (2021) Performed at [facility], [address] [place], [place] [address]   CBC     Status: Abnormal   Collection Time: 04/09/23 10:46 PM  Result Value Ref Range   WBC 5.9 4.0 - 10.5 K/uL   RBC 3.82 (L) 4.22 - 5.81 MIL/uL   Hemoglobin 11.8 (L) 13.0 - 17.0 g/dL   HCT 53.6 64.4 - 03.4 %   MCV 103.9 (H) 80.0 - 100.0 fL   MCH 30.9 26.0 - 34.0 pg   MCHC 29.7 (L) 30.0 - 36.0 g/dL   RDW 74.2 59.5 - 63.8 %   Platelets 139 (L) 150 - 400 K/uL   nRBC 0.0 0.0 - 0.2  %    Comment: Performed  at [facility] Lab, [address], [place], [place] [address]  Comprehensive metabolic panel     Status: Abnormal   Collection Time: 04/10/23  6:07 AM  Result Value Ref Range   Sodium 139 135 - 145 mmol/L   Potassium 4.5 3.5 - 5.1 mmol/L   Chloride 87 (L) 98 - 111 mmol/L   CO2 44 (H) 22 - 32 mmol/L   Glucose, Bld 126 (H) 70 - 99 mg/dL    Comment: Glucose reference range applies only to samples taken after fasting for at least 8 hours.   BUN 16 8 - 23 mg/dL   Creatinine, Ser 9.14 0.61 - 1.24 mg/dL   Calcium 8.7 (L) 8.9 - 10.3 mg/dL   Total Protein 6.3 (L) 6.5 - 8.1 g/dL   Albumin 3.2 (L) 3.5 - 5.0 g/dL   AST 19 15 - 41 U/L   ALT 15 0 - 44 U/L   Alkaline Phosphatase 41 38 - 126 U/L   Total Bilirubin 0.1 (L) 0.3 - 1.2 mg/dL   GFR, Estimated >78 >29 mL/min    Comment: (NOTE) Calculated using the CKD-EPI Creatinine Equation (2021)    Anion gap 8 5 - 15    Comment: Performed at [facility], [address] [place], [place] [address]  CBC     Status: Abnormal   Collection Time: 04/10/23  6:07 AM  Result Value Ref Range   WBC 5.6 4.0 - 10.5 K/uL   RBC 3.71 (L) 4.22 - 5.81 MIL/uL   Hemoglobin 11.5 (L) 13.0 - 17.0 g/dL   HCT 08.6 (L) 57.8 - 46.9 %   MCV 103.2 (H) 80.0 - 100.0 fL   MCH 31.0 26.0 - 34.0 pg   MCHC 30.0 30.0 - 36.0 g/dL   RDW 62.9 52.8 - 41.3 %   Platelets 144 (L) 150 - 400 K/uL   nRBC 0.0 0.0 - 0.2 %    Comment: Performed at [facility], [address] [place], [place] [address]   DG Chest Portable 1 View  Result Date: 04/09/2023 CLINICAL DATA:  COPD.  Altered mental status. EXAM: PORTABLE CHEST 1 VIEW COMPARISON:  09/03/2020. FINDINGS: Bilateral lungs appear hyperexpanded and hyperlucent with coarse bronchovascular markings, in keeping with COPD. Bilateral lungs otherwise appear clear. No dense consolidation. Bilateral costophrenic angles are clear. Normal cardio-mediastinal silhouette. No acute osseous abnormalities. The soft  tissues are within normal limits. IMPRESSION: 1. COPD. No active cardiopulmonary disease. Electronically Signed   By: Jules Schick M.D.   On: 04/09/2023 15:30    Pending Labs Unresulted Labs (From admission, onward)     Start     Ordered   04/16/23 0500  Creatinine, serum  (enoxaparin (LOVENOX)    CrCl >/= 30 ml/min)  Weekly,   R     Comments: while on enoxaparin therapy    04/09/23 1925            Vitals/Pain Today's Vitals   04/10/23 0330 04/10/23 0536 04/10/23 0602 04/10/23 0800  BP: 108/66  102/69 110/71  Pulse: 81  85 92  Resp: 18  18 (!) 24  Temp:  97.6 F (36.4 C)    TempSrc:  Oral    SpO2: 99%  100% 92%  Weight:      Height:      PainSc:   Asleep     Isolation Precautions No active isolations  Medications Medications  sodium chloride (OCEAN) 0.65 % nasal spray 2 spray (has no administration in  time range)  aspirin EC tablet 81 mg (81 mg Oral Given 04/09/23 2052)  cholecalciferol (VITAMIN D3) 25 MCG (1000 UNIT) tablet 2,000 Units (2,000 Units Oral Given 04/09/23 2052)  dextromethorphan-guaiFENesin (MUCINEX DM) 30-600 MG per 12 hr tablet 1 tablet (has no administration in time range)  docusate sodium (COLACE) capsule 100 mg (100 mg Oral Given 04/09/23 2053)  furosemide (LASIX) tablet 20 mg (has no administration in time range)  potassium chloride SA (KLOR-CON M) CR tablet 20 mEq (has no administration in time range)  albuterol (PROVENTIL) (2.5 MG/3ML) 0.083% nebulizer solution 3 mL (has no administration in time range)  famotidine (PEPCID) tablet 20 mg (has no administration in time range)  rosuvastatin (CRESTOR) tablet 40 mg (40 mg Oral Given 04/09/23 2052)  arformoterol (BROVANA) nebulizer solution 15 mcg (15 mcg Nebulization Given 04/10/23 0820)    And  umeclidinium bromide (INCRUSE ELLIPTA) 62.5 MCG/ACT 1 puff (1 puff Inhalation Given 04/10/23 0821)  vitamin B-12 (CYANOCOBALAMIN) tablet 100 mcg (100 mcg Oral Given 04/09/23 2052)  enoxaparin (LOVENOX) injection  40 mg (40 mg Subcutaneous Given 04/09/23 2113)  cefTRIAXone (ROCEPHIN) 1 g in sodium chloride 0.9 % 100 mL IVPB (0 g Intravenous Stopped 04/09/23 2200)  methylPREDNISolone sodium succinate (SOLU-MEDROL) 125 mg/2 mL injection 125 mg (125 mg Intravenous Given 04/10/23 0820)    Followed by  predniSONE (DELTASONE) tablet 40 mg (has no administration in time range)  albuterol (PROVENTIL) (2.5 MG/3ML) 0.083% nebulizer solution 2.5 mg (has no administration in time range)  albuterol (PROVENTIL) (2.5 MG/3ML) 0.083% nebulizer solution 10 mg (10 mg Nebulization Given 04/09/23 1447)  ipratropium (ATROVENT) nebulizer solution 0.5 mg (0.5 mg Nebulization Given 04/09/23 1447)    Mobility walks     Focused Assessments Pulmonary Assessment Handoff:  Lung sounds: Bilateral Breath Sounds: Diminished L Breath Sounds: Diminished R Breath Sounds: Diminished O2 Device: Nasal Cannula O2 Flow Rate (L/min): 2 L/min    R Recommendations: See Admitting Provider Note  Report given to:   Additional Notes:

## 2023-04-10 NOTE — Evaluation (Signed)
Physical Therapy Evaluation Patient Details Name: Anthony Nelson MRN: [id number] DOB: 12-09-1960 Today's Date: 04/10/2023  History of Present Illness  Patient is a 62 yo male presenting to the ED with AMS and desatting to 54% on 04/09/23. Pt with copd exacerbation. PMH includes: COPD, diastolic heart failure, GERD, pulmonary hypertension  Clinical Impression  Pt presents to PT primarily limited by dyspnea and decr activity tolerance. SpO2 dropped to 68% on 2L with amb. As respiratory status improves his activity tolerance should improve. Will follow acutely but do not feel he will need PT after DC.         If plan is discharge home, recommend the following:     Can travel by private vehicle        Equipment Recommendations None recommended by PT  Recommendations for Other Services       Functional Status Assessment Patient has had a recent decline in their functional status and demonstrates the ability to make significant improvements in function in a reasonable and predictable amount of time.     Precautions / Restrictions Precautions Precautions: Fall Precaution Comments: watch O2 Restrictions Weight Bearing Restrictions: No      Mobility  Bed Mobility Overal bed mobility: Needs Assistance Bed Mobility: Supine to Sit, Sit to Supine     Supine to sit: Supervision Sit to supine: Supervision   General bed mobility comments: increased time due to being on gurney    Transfers Overall transfer level: Needs assistance Equipment used: Rollator (4 wheels) Transfers: Sit to/from Stand Sit to Stand: Supervision           General transfer comment: for safety    Ambulation/Gait Ambulation/Gait assistance: Supervision Gait Distance (Feet): 150 Feet Assistive device: Rollator (4 wheels) Gait Pattern/deviations: Step-through pattern, Decreased stride length Gait velocity: decr Gait velocity interpretation: 1.31 - 2.62 ft/sec, indicative of limited community ambulator    General Gait Details: Pt with initial tripodding with hands on rollator. Slow pace due to SOB.  Stairs            Wheelchair Mobility     Tilt Bed    Modified Rankin (Stroke Patients Only)       Balance Overall balance assessment: Mild deficits observed, not formally tested                                           Pertinent Vitals/Pain Pain Assessment Pain Assessment: No/denies pain    Home Living Family/patient expects to be discharged to:: Shelter[facility] ([facility]) Living Arrangements: Alone Available Help at Discharge: Family;Available PRN/intermittently             Home Equipment: Rollator (4 wheels) (uses Dad's old equipment) Additional Comments: Patient's belongings are currently in storage due to a bad separation. Patient is at the [facility] through the [place]. Bathroom is shared    Prior Function Prior Level of Function : Independent/Modified Independent             Mobility Comments: independent with extra time, has a RW, but is old and his fathers ADLs Comments: independent with extra time, frequent breaks due to SOB     Extremity/Trunk Assessment   Upper Extremity Assessment Upper Extremity Assessment: Defer to OT evaluation    Lower Extremity Assessment Lower Extremity Assessment: Generalized weakness    Cervical / Trunk Assessment Cervical / Trunk Assessment: Normal  Communication   Communication Communication:  No apparent difficulties Cueing Techniques: Verbal cues  Cognition Arousal: Alert Behavior During Therapy: WFL for tasks assessed/performed Overall Cognitive Status: Within Functional Limits for tasks assessed                                          General Comments General comments (skin integrity, edema, etc.): At rest pt on 2L O2 with SpO2 90%. Amb on 2L with SpO2 to 68% at end of amb. Incr O2 to 4L with eventual return to 89% after several minutes.    Exercises      Assessment/Plan    PT Assessment Patient needs continued PT services  PT Problem List Decreased activity tolerance;Decreased mobility;Cardiopulmonary status limiting activity       PT Treatment Interventions DME instruction;Gait training;Functional mobility training;Therapeutic exercise;Therapeutic activities;Patient/family education    PT Goals (Current goals can be found in the Care Plan section)  Acute Rehab PT Goals Patient Stated Goal: return home PT Goal Formulation: With patient Time For Goal Achievement: 04/24/23 Potential to Achieve Goals: Good    Frequency Min 1X/week     Co-evaluation               AM-PAC PT "6 Clicks" Mobility  Outcome Measure Help needed turning from your back to your side while in a flat bed without using bedrails?: None Help needed moving from lying on your back to sitting on the side of a flat bed without using bedrails?: None Help needed moving to and from a bed to a chair (including a wheelchair)?: A Little Help needed standing up from a chair using your arms (e.g., wheelchair or bedside chair)?: A Little Help needed to walk in hospital room?: A Little Help needed climbing 3-5 steps with a railing? : A Little 6 Click Score: 20    End of Session Equipment Utilized During Treatment: Oxygen Activity Tolerance: Treatment limited secondary to medical complications (Comment) (dyspnea) Patient left: in bed;with call bell/phone within reach Nurse Communication: Mobility status;Other (comment) (decr in SpO2) PT Visit Diagnosis: Other abnormalities of gait and mobility (R26.89)    Time: 3474-2595 PT Time Calculation (min) (ACUTE ONLY): 18 min   Charges:   PT Evaluation $PT Eval Moderate Complexity: 1 Mod   PT General Charges $$ ACUTE PT VISIT: 1 Visit         [facility] PT Acute Rehabilitation Services Office [phone]   Angelina Ok [facility] 04/10/2023, 11:44 AM

## 2023-04-10 NOTE — Evaluation (Signed)
Occupational Therapy Evaluation Patient Details Name: Anthony Nelson MRN: [id number] DOB: [birth year] Today's Date: 04/10/2023   History of Present Illness Patient is a 62 yo male presenting to the ED with AMS and desatting to 54% on 04/09/23. PMH includes: COPD, diastolic heart failure, GERD, pulmonary hypertension   Clinical Impression   Prior to this admission, patient living at [facility], and transitioning to his own apartment within the next few weeks. Patient independent in his ADLs (with extra time) and occasionally uses a RW. Currently, patient presenting with need for 2LO2 in order to maintain appropriate saturations, generalized weakness and decreased activity tolerance. Patient with need for CGA for ADLs and minimal functional mobility. Patient with no need for OT follow up at discharge, however would benefit from RW as patient has his father and has never been issued one personally. OT will continue to follow acutely.      If plan is discharge home, recommend the following: A little help with walking and/or transfers;A little help with bathing/dressing/bathroom;Assist for transportation;Help with stairs or ramp for entrance (initially)    Functional Status Assessment  Patient has had a recent decline in their functional status and demonstrates the ability to make significant improvements in function in a reasonable and predictable amount of time.  Equipment Recommendations  Other (comment) (Rolling walker or rollator defer to PT)    Recommendations for Other Services       Precautions / Restrictions Precautions Precautions: Fall Precaution Comments: watch O2 Restrictions Weight Bearing Restrictions: No      Mobility Bed Mobility Overal bed mobility: Needs Assistance Bed Mobility: Supine to Sit, Sit to Supine     Supine to sit: Supervision Sit to supine: Supervision   General bed mobility comments: increased time due to being on gurney    Transfers Overall  transfer level: Needs assistance Equipment used: 1 person hand held assist Transfers: Sit to/from Stand Sit to Stand: Contact guard assist           General transfer comment: One person HHA to ensure safety and transitioning from high bed height      Balance Overall balance assessment: Mild deficits observed, not formally tested                                         ADL either performed or assessed with clinical judgement   ADL Overall ADL's : Needs assistance/impaired Eating/Feeding: Set up;Sitting   Grooming: Set up;Sitting   Upper Body Bathing: Set up;Sitting   Lower Body Bathing: Minimal assistance;Sit to/from stand;Sitting/lateral leans   Upper Body Dressing : Set up;Sitting   Lower Body Dressing: Minimal assistance;Sitting/lateral leans;Sit to/from stand   Toilet Transfer: Minimal assistance;Ambulation Toilet Transfer Details (indicate cue type and reason): simluated Toileting- Clothing Manipulation and Hygiene: Contact guard assist;Sitting/lateral lean;Sit to/from stand       Functional mobility during ADLs: Contact guard assist General ADL Comments: Patient presenting with need for 2LO2 in order to maintain appropriate saturations, generalized weakness and decreased activity tolerance. Patient with need for CGA for ADLs and minimal functional mobility. Patient with no need for OT follow up at discharge, however would benefit from RW as patient has his father and has never been issued one personally. OT will continue to follow acutely.     Vision Baseline Vision/History: 1 Wears glasses Ability to See in Adequate Light: 0 Adequate Patient Visual Report: No change from baseline  Vision Assessment?: No apparent visual deficits     Perception Perception: Within Functional Limits       Praxis Praxis: WFL       Pertinent Vitals/Pain Pain Assessment Pain Assessment: Faces Faces Pain Scale: Hurts a little bit Pain Location: generalized with  movement Pain Descriptors / Indicators: Grimacing, Discomfort, Guarding Pain Intervention(s): Limited activity within patient's tolerance, Monitored during session, Repositioned     Extremity/Trunk Assessment Upper Extremity Assessment Upper Extremity Assessment: Generalized weakness;Right hand dominant   Lower Extremity Assessment Lower Extremity Assessment: Defer to PT evaluation   Cervical / Trunk Assessment Cervical / Trunk Assessment: Normal   Communication Communication Communication: No apparent difficulties Cueing Techniques: Verbal cues   Cognition Arousal: Alert Behavior During Therapy: WFL for tasks assessed/performed Overall Cognitive Status: Within Functional Limits for tasks assessed                                 General Comments: Alert and appropriate     General Comments  VSS on 2LO2, education on pursed lip breathing, adaptive equipment and basic education on energy conservation    Exercises     Shoulder Instructions      Home Living Family/patient expects to be discharged to:: Shelter/[facility] ([facility]) Living Arrangements: Alone Available Help at Discharge: Family;Available PRN/intermittently               Bathroom Shower/Tub: Walk-in shower         Home Equipment:  (uses Dad's old equipment)   Additional Comments: Patient's belongings are currently in storage due to a bad separation. Patient is at the [facility] through the [place]. Bathroom is shared      Prior Functioning/Environment Prior Level of Function : Independent/Modified Independent             Mobility Comments: independent with extra time, has a RW, but is old and his fathers ADLs Comments: independent with extra time, frequent breaks due to SOB        OT Problem List: Decreased strength;Decreased activity tolerance;Cardiopulmonary status limiting activity      OT Treatment/Interventions: Self-care/ADL training;Therapeutic exercise;Energy  conservation;DME and/or AE instruction;Manual therapy;Therapeutic activities;Patient/family education;Balance training    OT Goals(Current goals can be found in the care plan section) Acute Rehab OT Goals Patient Stated Goal: to get better and get to my apartment OT Goal Formulation: With patient Time For Goal Achievement: 04/24/23 Potential to Achieve Goals: Good  OT Frequency: Min 1X/week    Co-evaluation              AM-PAC OT "6 Clicks" Daily Activity     Outcome Measure Help from another person eating meals?: A Little Help from another person taking care of personal grooming?: A Little Help from another person toileting, which includes using toliet, bedpan, or urinal?: A Little Help from another person bathing (including washing, rinsing, drying)?: A Little Help from another person to put on and taking off regular upper body clothing?: A Little Help from another person to put on and taking off regular lower body clothing?: A Little 6 Click Score: 18   End of Session Equipment Utilized During Treatment: Oxygen Nurse Communication: Mobility status  Activity Tolerance: Patient tolerated treatment well Patient left: in bed;with call bell/phone within reach  OT Visit Diagnosis: Unsteadiness on feet (R26.81);Muscle weakness (generalized) (M62.81)                Time: 3086-5784 OT Time  Calculation (min): 15 min Charges:  OT General Charges $OT Visit: 1 Visit OT Evaluation $OT Eval Moderate Complexity: 1 Mod  Pollyann Glen E. Billyjack Trompeter, OTR/L Acute Rehabilitation Services [phone]   Cherlyn Cushing 04/10/2023, 10:58 AM

## 2023-04-11 DIAGNOSIS — J9601 Acute respiratory failure with hypoxia: Secondary | ICD-10-CM | POA: Diagnosis not present

## 2023-04-11 DIAGNOSIS — J441 Chronic obstructive pulmonary disease with (acute) exacerbation: Secondary | ICD-10-CM | POA: Diagnosis not present

## 2023-04-11 DIAGNOSIS — I5032 Chronic diastolic (congestive) heart failure: Secondary | ICD-10-CM | POA: Diagnosis not present

## 2023-04-11 DIAGNOSIS — E43 Unspecified severe protein-calorie malnutrition: Secondary | ICD-10-CM | POA: Insufficient documentation

## 2023-04-11 DIAGNOSIS — I2781 Cor pulmonale (chronic): Secondary | ICD-10-CM | POA: Diagnosis not present

## 2023-04-11 MED ORDER — DOXYCYCLINE HYCLATE 100 MG PO TABS
100.0000 mg | ORAL_TABLET | Freq: Two times a day (BID) | ORAL | Status: DC
  Administered 2023-04-11 – 2023-04-15 (×9): 100 mg via ORAL
  Filled 2023-04-11 (×9): qty 1

## 2023-04-11 MED ORDER — METHYLPREDNISOLONE SODIUM SUCC 125 MG IJ SOLR
60.0000 mg | Freq: Once | INTRAMUSCULAR | Status: AC
  Administered 2023-04-11: 60 mg via INTRAVENOUS
  Filled 2023-04-11: qty 2

## 2023-04-11 MED ORDER — ADULT MULTIVITAMIN W/MINERALS CH
1.0000 | ORAL_TABLET | Freq: Every day | ORAL | Status: DC
  Administered 2023-04-11 – 2023-04-15 (×5): 1 via ORAL
  Filled 2023-04-11 (×5): qty 1

## 2023-04-11 MED ORDER — INFLUENZA VIRUS VACC SPLIT PF (FLUZONE) 0.5 ML IM SUSY
0.5000 mL | PREFILLED_SYRINGE | INTRAMUSCULAR | Status: AC
  Administered 2023-04-12: 0.5 mL via INTRAMUSCULAR
  Filled 2023-04-11: qty 0.5

## 2023-04-11 MED ORDER — ENSURE ENLIVE PO LIQD
237.0000 mL | Freq: Two times a day (BID) | ORAL | Status: DC
  Administered 2023-04-11 – 2023-04-15 (×7): 237 mL via ORAL

## 2023-04-11 NOTE — Progress Notes (Addendum)
COPD EXACERBATION   04/11/23 1012  TOC Brief Assessment  Insurance and Status Reviewed  Patient has primary care physician No  Home environment has been reviewed Resides at the [facility].  Prior level of function: PTA  independent with ADL's, home oxygen, rollator.  Prior/Current Home Services No current home services  Social Determinants of Health Reivew SDOH reviewed no interventions necessary  Readmission risk has been reviewed No  Transition of care needs transition of care needs identified, TOC will continue to follow   Pt estranged from wife. States had resided @ the [facility] x 14 month. States hoping to transition housing soon. Supportive daughter in [place] [phone]).   Watching for oxygen ( current home oxygen , 2l/min [place]) need changes. PT/OT evals pending. Pt without transportation issues or RX med concerns. Pt with [facility] benefits. NCM called [employer] and authorization ID received, [id number].  TOC team following and will assist with needs.... Gae Gallop RN,BSN,CM [phone]   04/11/2023 1315 Pt active with [facility]. PCP: Dr. Clifton Custard  and SW: Healther Holloway [phone] ext. [phone])

## 2023-04-11 NOTE — Progress Notes (Signed)
Initial Nutrition Assessment  DOCUMENTATION CODES:   Severe malnutrition in context of social or environmental circumstances, Underweight  INTERVENTION:  Liberalize diet to regular to promote adequate PO intake Ensure Enlive po BID, each supplement provides 350 kcal and 20 grams of protein. MVI with minerals daily Coupons provided for Ensure  NUTRITION DIAGNOSIS:   Severe Malnutrition related to social / environmental circumstances as evidenced by severe fat depletion, severe muscle depletion.  GOAL:   Patient will meet greater than or equal to 90% of their needs  MONITOR:   PO intake, Supplement acceptance, Diet advancement, Labs, Weight trends  REASON FOR ASSESSMENT:   Consult Assessment of nutrition requirement/status  ASSESSMENT:   Pt admitted with SOB d/t COPD exacerbation. PMH significant for COPD, diastolic heart failure, GERD, pulmonary HTN.  Spoke with pt at bedside. He states that he has been living in the [facility] [place] >1 year. He reports that he has not been eating as well as he should be d/t dislike of food provided there. Recalls typically consuming at least 2 meals per day. He occasionally will order meals via delivery from places such as [employer] and will eat half a sub while saving the other half for another day. Pt mentions that he has had anxiety over social/environmental situations that have affected the way he has been able to eat. He endorses having adequate resources to food.    No documented meal completions on file to review. Pt reports that he has been eating well during admission. He recalls that he used to consume Ensure supplements but does not have a personal space to store them therefore he is not currently consuming them. He is agreeable to receive them during admission. Denies difficulty chewing/swallowing foods/liquids.   Unfortunately, there is limited weight history on file to review over the last year. Of weights documented,  it appears that his weight has remained stable between 58-59 kg.   Medications: vitamin D3 2,000 units daily, colace, prednisone, Vitamin B12, IV abx  Labs reviewed    NUTRITION - FOCUSED PHYSICAL EXAM:  Flowsheet Row Most Recent Value  Orbital Region Severe depletion  Upper Arm Region Severe depletion  Thoracic and Lumbar Region Severe depletion  Buccal Region Severe depletion  Temple Region Severe depletion  Clavicle Bone Region Severe depletion  Clavicle and Acromion Bone Region Severe depletion  Scapular Bone Region Severe depletion  Dorsal Hand Moderate depletion  Patellar Region Severe depletion  Anterior Thigh Region Severe depletion  Posterior Calf Region Moderate depletion  Edema (RD Assessment) None  Hair Reviewed  Eyes Reviewed  Mouth Reviewed  Skin Reviewed  Nails Reviewed       Diet Order:   Diet Order             Diet regular Room service appropriate? Yes; Fluid consistency: Thin  Diet effective now                   EDUCATION NEEDS:   Education needs have been addressed  Skin:  Skin Assessment: Reviewed RN Assessment  Last BM:  PTA  Height:   Ht Readings from Last 1 Encounters:  04/09/23 6' (1.829 m)    Weight:   Wt Readings from Last 1 Encounters:  04/09/23 59 kg   BMI:  Body mass index is 17.63 kg/m.  Estimated Nutritional Needs:   Kcal:  1900-2100  Protein:  95-110g  Fluid:  >/=1.9L  Drusilla Kanner, RDN, LDN Clinical Nutrition

## 2023-04-11 NOTE — Progress Notes (Signed)
Mobility Specialist: Progress Note   04/11/23 1157  Mobility  Activity Ambulated with assistance in hallway  Level of Assistance Standby assist, set-up cues, supervision of patient - no hands on  Assistive Device Front wheel walker  Distance Ambulated (ft) 100 ft  Activity Response Tolerated well  Mobility Referral Yes  $Mobility charge 1 Mobility  Mobility Specialist Start Time (ACUTE ONLY) 0934  Mobility Specialist Stop Time (ACUTE ONLY) 1003  Mobility Specialist Time Calculation (min) (ACUTE ONLY) 29 min    During Mobility: SpO2 70-93% 3LO2 Post Mobility: SpO2 91-92% 3LO2  Pt was agreeable to mobility session - received in bed. Desat ~6-7 times throughout ambulation to 70-88% on 3LO2 but able to sat to SpO2 89-93% on 3LO2 after taking standing rest breaks. Took 6x standing break and 1x seated break d/t O2 desat. Pt denies any SOB, fatigue, or dizziness; was able to ambulate and talk normally throughout entire session. Returned to room without fault. Left in bed with all needs met, call bell in reach.   Maurene Capes Mobility Specialist Please contact via SecureChat or Rehab office at [phone]

## 2023-04-11 NOTE — Progress Notes (Signed)
Progress Note   Patient: Anthony Nelson ZOX:[id number] DOB: 08-03-1960 DOA: 04/09/2023     2 DOS: the patient was seen and examined on 04/11/2023   Brief hospital course: Maritza Sliker is a 62 y.o. male with medical history significant of COPD, diastolic heart failure, GERD, pulmonary hypertension, who presents to the ER with worsening shortness of breath.  In the ED he had low oxygen saturation 54% on room air, ABG showed hypercarbia, placed on BiPAP, got DuoNebs IV steroids.  Patient is admitted to hospitalist service for further management evaluation of COPD exacerbation  Assessment and Plan: COPD exacerbation Acute on chronic hypoxic and hypercapnic respiratory failure Patient desaturating while walking even on 3L o2. Patient tapered to oral steroids, I gave a dose of 60mg  solumedrol. Continue nebulizer treatment. Changed IV rocephin to doxy 100mg  bid. Has allergy to azithro. Taper supplemental oxygen to his baseline 2 to 3 L O2 via nasal cannula. Continue Brovana, incruse ellipta. Continue antitussives.  GERD continue PPI.  Chronic diastolic heart failure: Euvolemic. Continue home 20mg  Lasix dose.  Hyperlipidemia: Continue statin therapy.  Severe malnutrition- BMI 17.63 Dietician evaluation. Encourage oral diet, supplements.  Out of bed to chair. Incentive spirometry. Nursing supportive care. Fall, aspiration precautions. DVT prophylaxis   Code Status: Full Code  Subjective: Patient is seen and examined today morning. He is currently on 2 L supplemental oxygen while resting. Denies cough, chest pain. Patient walked with PT desaturated multiple times.  Physical Exam: Vitals:   04/10/23 2023 04/10/23 2034 04/11/23 0453 04/11/23 0725  BP:  108/73 98/76 100/68  Pulse:  97 80 89  Resp:  20 20 18   Temp:  98.2 F (36.8 C) 98 F (36.7 C) 98.5 F (36.9 C)  TempSrc:   Oral   SpO2: 98% 91% 100% 92%  Weight:      Height:        General - Elderly elderly thin built  African-American male, mild respiratory distress HEENT - PERRLA, EOMI, atraumatic head, non tender sinuses. Lung - Clear, bibasilar rhonchi, diffuse wheezes improved. Heart - S1, S2 heard, no murmurs, rubs, no pedal edema. Abdomen - Soft, non tender nondistended, bowel sounds good Neuro - Alert, awake and oriented x 3, non focal exam. Skin - Warm and dry.  Data Reviewed:      Latest Ref Rng & Units 04/10/2023    6:07 AM 04/09/2023   10:46 PM 04/09/2023    2:29 PM  CBC  WBC 4.0 - 10.5 K/uL 5.6  5.9  7.2   Hemoglobin 13.0 - 17.0 g/dL 81.1  91.4  78.2   Hematocrit 39.0 - 52.0 % 38.3  39.7  40.4   Platelets 150 - 400 K/uL 144  139  149       Latest Ref Rng & Units 04/10/2023    6:07 AM 04/09/2023    9:17 PM 04/09/2023    2:29 PM  BMP  Glucose 70 - 99 mg/dL 956   213   BUN 8 - 23 mg/dL 16   21   Creatinine 0.86 - 1.24 mg/dL 5.78  4.69  6.29   Sodium 135 - 145 mmol/L 139   142   Potassium 3.5 - 5.1 mmol/L 4.5   3.8   Chloride 98 - 111 mmol/L 87   87   CO2 22 - 32 mmol/L 44   40   Calcium 8.9 - 10.3 mg/dL 8.7   8.6    DG Chest Portable 1 View  Result Date: 04/09/2023 CLINICAL DATA:  COPD.  Altered mental status. EXAM: PORTABLE CHEST 1 VIEW COMPARISON:  09/03/2020. FINDINGS: Bilateral lungs appear hyperexpanded and hyperlucent with coarse bronchovascular markings, in keeping with COPD. Bilateral lungs otherwise appear clear. No dense consolidation. Bilateral costophrenic angles are clear. Normal cardio-mediastinal silhouette. No acute osseous abnormalities. The soft tissues are within normal limits. IMPRESSION: 1. COPD. No active cardiopulmonary disease. Electronically Signed   By: Jules Schick M.D.   On: 04/09/2023 15:30     Family Communication: Discussed patient, family at bedside they understand and agree. All questions answereed.  Disposition: Status is: Inpatient Remains inpatient appropriate because: COPD treatment, hypoxia with exertion  Planned Discharge Destination: Home  with Home Health     Time spent: 42 minutes  Author: Marcelino Duster, MD 04/11/2023 1:40 PM Secure chat 7am to 7pm For on call review [email or web address].

## 2023-04-12 DIAGNOSIS — J449 Chronic obstructive pulmonary disease, unspecified: Secondary | ICD-10-CM | POA: Diagnosis not present

## 2023-04-12 DIAGNOSIS — J9621 Acute and chronic respiratory failure with hypoxia: Secondary | ICD-10-CM | POA: Diagnosis not present

## 2023-04-12 NOTE — Progress Notes (Signed)
Progress Note   Patient: [name]:[id number] DOB: [birth year] DOA: 04/09/2023     3 DOS: the patient was seen and examined on 04/12/2023   Brief hospital course: Anthony Nelson is a 62 y.o. male with medical history significant of COPD, diastolic heart failure, GERD, pulmonary hypertension, who presents to the ER with worsening shortness of breath.  In the ED he had low oxygen saturation 54% on room air, ABG showed hypercarbia, placed on BiPAP, got DuoNebs IV steroids.  Patient is admitted to hospitalist service for further management evaluation of COPD exacerbation  Assessment and Plan: COPD exacerbation Acute on chronic hypoxic and hypercapnic respiratory failure -reported desaturating while walking -- asking for re-eval today. -given dose of 60mg  solumedrol IV yesterday and on PO steroids today -Continue nebulizer treatment. -PO abx -Taper supplemental oxygen to his baseline 2 to 3 L O2 via nasal cannula-- monitor when walked today -lives in shelter/half way house so trying to make sure is able to get around with O2 Continue Brovana, incruse ellipta. Continue antitussives.  GERD  -continue PPI.  Chronic diastolic heart failure: Euvolemic. Continue home 20mg  Lasix dose PRN  Hyperlipidemia: Continue statin therapy.  Severe malnutrition- BMI 17.63 Dietician evaluation. Encourage oral diet, supplements.  Hypotension -BP lower end of normal -continue to monitor for symptoms -consider midodrine if needed   Ambulate     Code Status: Full Code  Subjective:  Feels well at rest but gets very winded when ambulating/OOB etc  Physical Exam: Vitals:   04/12/23 0453 04/12/23 0824 04/12/23 0910 04/12/23 1000  BP: 136/78 (!) 87/63  96/71  Pulse:  86 81   Resp:  15 14   Temp:  (!) 97.4 F (36.3 C)    TempSrc:  Oral    SpO2:  97% 97%   Weight:      Height:         General: Appearance:    Thin male in no acute distress     Lungs:     Diminished b/l, no wheezing, on  [place]  Heart:    Normal heart rate.   MS:   All extremities are intact.   Neurologic:   Awake, alert, oriented x 3. No apparent focal neurological           defect.     Skin - Warm and dry.  Data Reviewed:      Latest Ref Rng & Units 04/10/2023    6:07 AM 04/09/2023   10:46 PM 04/09/2023    2:29 PM  CBC  WBC 4.0 - 10.5 K/uL 5.6  5.9  7.2   Hemoglobin 13.0 - 17.0 g/dL 56.2  13.0  86.5   Hematocrit 39.0 - 52.0 % 38.3  39.7  40.4   Platelets 150 - 400 K/uL 144  139  149       Latest Ref Rng & Units 04/10/2023    6:07 AM 04/09/2023    9:17 PM 04/09/2023    2:29 PM  BMP  Glucose 70 - 99 mg/dL 784   696   BUN 8 - 23 mg/dL 16   21   Creatinine 2.95 - 1.24 mg/dL 2.84  1.32  4.40   Sodium 135 - 145 mmol/L 139   142   Potassium 3.5 - 5.1 mmol/L 4.5   3.8   Chloride 98 - 111 mmol/L 87   87   CO2 22 - 32 mmol/L 44   40   Calcium 8.9 - 10.3 mg/dL 8.7   8.6  No results found.     Disposition: Status is: Inpatient Remains inpatient appropriate because: hypoxia with exertion  Planned Discharge Destination: Home with Home Health    Time spent: 42 minutes  Author: Joseph Art, DO 04/12/2023 10:32 AM Secure chat 7am to 7pm For on call review [email or web address].

## 2023-04-12 NOTE — Progress Notes (Signed)
Physical Therapy Treatment Patient Details Name: Anthony Nelson MRN: [id number] DOB: October 28, 1960 Today's Date: 04/12/2023   History of Present Illness Patient is a 62 yo male presenting to the ED with AMS and desatting to 54% on 04/09/23. Pt with copd exacerbation. PMH includes: COPD, diastolic heart failure, GERD, pulmonary hypertension    PT Comments  Pt tolerated treatment well today. Pt was able to ambulate in hallway with rollator at supervision level. Pt received on 2L at 85% reporting SOB. Pt titrated to 3L and recovered to 92%. Pt quickly desat to 77% on 3L upon standing requiring a seated rest break. Pt cued for PLB and recovered to 94% on 4L. Pt briefly dropped to 85% on 4L during seonc standing rest break however mostly stayed around 92% on 4L durign ambulation. Pt left on 2L at 92%. No change in DC/DME recs at this time. PT will continue to follow.     If plan is discharge home, recommend the following: Assistance with cooking/housework;Direct supervision/assist for medications management;Assist for transportation;Help with stairs or ramp for entrance   Can travel by private vehicle        Equipment Recommendations  None recommended by PT    Recommendations for Other Services       Precautions / Restrictions Precautions Precautions: Fall Precaution Comments: watch O2 Restrictions Weight Bearing Restrictions: No     Mobility  Bed Mobility Overal bed mobility: Needs Assistance Bed Mobility: Supine to Sit     Supine to sit: Supervision     General bed mobility comments: no physical assist provided    Transfers Overall transfer level: Needs assistance Equipment used: Rollator (4 wheels) Transfers: Sit to/from Stand Sit to Stand: Supervision           General transfer comment: Pt demonstrated good brake management with rollator.    Ambulation/Gait Ambulation/Gait assistance: Supervision Gait Distance (Feet): 150 Feet Assistive device: Rollator (4  wheels) Gait Pattern/deviations: Step-through pattern, Decreased stride length Gait velocity: decr     General Gait Details: 2 standing rest breaks with pt tripodding on rollator.   Stairs             Wheelchair Mobility     Tilt Bed    Modified Rankin (Stroke Patients Only)       Balance Overall balance assessment: Mild deficits observed, not formally tested                                          Cognition Arousal: Alert Behavior During Therapy: WFL for tasks assessed/performed Overall Cognitive Status: Within Functional Limits for tasks assessed                                 General Comments: Alert and appropriate        Exercises      General Comments General comments (skin integrity, edema, etc.): Pt received on 2L at 85% reporting SOB. Pt titrated to 3L and recovered to 92%. Pt quickly desat to 77% on 3L upon standing requiring a seated rest break. Pt cued for PLB and recovered to 94% on 4L. Pt briefly dropped to 85% on 4L during seonc standing rest break however mostly stayed around 92% on 4L durign ambulation. Pt left on 2L at 92%.      Pertinent Vitals/Pain Pain Assessment Pain Assessment: No/denies pain  Home Living                          Prior Function            PT Goals (current goals can now be found in the care plan section) Progress towards PT goals: Progressing toward goals    Frequency    Min 1X/week      PT Plan      Co-evaluation              AM-PAC PT "6 Clicks" Mobility   Outcome Measure  Help needed turning from your back to your side while in a flat bed without using bedrails?: None Help needed moving from lying on your back to sitting on the side of a flat bed without using bedrails?: None Help needed moving to and from a bed to a chair (including a wheelchair)?: A Little Help needed standing up from a chair using your arms (e.g., wheelchair or bedside  chair)?: A Little Help needed to walk in hospital room?: A Little Help needed climbing 3-5 steps with a railing? : A Little 6 Click Score: 20    End of Session Equipment Utilized During Treatment: Gait belt;Oxygen Activity Tolerance: Patient tolerated treatment well Patient left: in chair;with call bell/phone within reach Nurse Communication: Mobility status PT Visit Diagnosis: Other abnormalities of gait and mobility (R26.89)     Time: 0272-5366 PT Time Calculation (min) (ACUTE ONLY): 27 min  Charges:    $Gait Training: 8-22 mins $Therapeutic Activity: 8-22 mins                       Shela Nevin, PT, DPT Acute Rehab Services [phone]    Gladys Damme 04/12/2023, 3:36 PM

## 2023-04-12 NOTE — Progress Notes (Signed)
OT Cancellation Note  Patient Details Name: Anthony Nelson MRN: [id number] DOB: 1960/09/02   Cancelled Treatment:    Reason Eval/Treat Not Completed: Other (comment) Patient working with PT upon OT entry. OT will follow back as time permits.   [name], OTR/L Acute Rehabilitation Services [phone]   Cherlyn Cushing 04/12/2023, 2:56 PM

## 2023-04-13 DIAGNOSIS — E43 Unspecified severe protein-calorie malnutrition: Secondary | ICD-10-CM | POA: Diagnosis not present

## 2023-04-13 DIAGNOSIS — J9612 Chronic respiratory failure with hypercapnia: Secondary | ICD-10-CM | POA: Diagnosis not present

## 2023-04-13 DIAGNOSIS — J441 Chronic obstructive pulmonary disease with (acute) exacerbation: Secondary | ICD-10-CM | POA: Diagnosis not present

## 2023-04-13 DIAGNOSIS — J9611 Chronic respiratory failure with hypoxia: Secondary | ICD-10-CM | POA: Diagnosis not present

## 2023-04-13 MED ORDER — SENNOSIDES-DOCUSATE SODIUM 8.6-50 MG PO TABS
1.0000 | ORAL_TABLET | Freq: Two times a day (BID) | ORAL | Status: DC
  Administered 2023-04-13 – 2023-04-15 (×4): 1 via ORAL
  Filled 2023-04-13 (×4): qty 1

## 2023-04-13 MED ORDER — POLYETHYLENE GLYCOL 3350 17 G PO PACK: 17.0000 g | PACK | Freq: Every day | ORAL | Status: DC | PRN

## 2023-04-13 MED ORDER — POLYETHYLENE GLYCOL 3350 17 G PO PACK
17.0000 g | PACK | Freq: Once | ORAL | Status: AC
  Administered 2023-04-13: 17 g via ORAL
  Filled 2023-04-13: qty 1

## 2023-04-13 NOTE — Progress Notes (Signed)
Nurse requested Mobility Specialist to perform oxygen saturation test with pt which includes removing pt from oxygen both at rest and while ambulating.  Below are the results from that testing.     Patient Saturations on Room Air at Rest = spO2 80%  Patient Saturations on 4 Liters of oxygen while Ambulating = sp02 90%  Reported results to nurse.

## 2023-04-13 NOTE — Plan of Care (Signed)
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
## 2023-04-13 NOTE — Progress Notes (Signed)
PROGRESS NOTE    Anthony Nelson  [id number]:[id number] DOB: 1960-09-14 DOA: 04/09/2023 PCP: Storm Frisk, MD    Brief Narrative:   Anthony Nelson is a 62 y.o. male [employer] veteran with past medical history significant for COPD, chronic hypoxic respite failure on 2 L nasal cannula at baseline, chronic diastolic congestive heart failure, hyperlipidemia, GERD, severe protein calorie malnutrition, homelessness currently living in a boarding home who presented to [facility] ED on 04/09/2023 via EMS with progressive shortness of breath, wheezing.  On EMS arrival patient's SpO2 was noted to be 54% on room air and was subsequently placed on nonrebreather with improvement of SpO2 to 88%.  He was given albuterol, Atrovent, DuoNeb, Solu-Medrol and route to the ED.  Patient denied chest pain.  In the ED, temperature 98.4 F, HR 104, RR 17, BP 117/70, SpO2 100% on 15 L NRB.  WBC 7.2, hemoglobin 12.0, platelet count 149.  Sodium 142, potassium 3.8, chloride 87, CO2 40, glucose 169, BUN 21, creatinine 0.69.  COVID PCR negative.  Chest x-ray with COPD, no active cardiopulmonary disease process.  EDP continued scheduled breathing treatments, IV steroids however patient continued with hypoxia and increased work of breathing.  ABG notable for CO2 retention and patient was placed on BiPAP.  TRH consulted for admission for further evaluation management of COPD exacerbation.  Assessment & Plan:   Acute on chronic hypoxic hypercapnic respiratory failure, POA COPD exacerbation Patient presenting to the ED via EMS with progressive shortness of breath, wheezing.  Was found by EMS with SpO2 54% on room air.  At baseline on 2-3 L nasal cannula.  Currently homeless living in a boarding home.  Reports compliance with his home inhalers; has them readily available.  Patient is afebrile without leukocytosis.  Chest x-ray with no focal consolidation.  Completed 5-day course of steroids. -- Brovana neb twice daily -- Incruse Ellipta 1 puff daily --  Doxycycline 100 mg p.o. every 12 hours --Albuterol neb every 6 hours as needed wheezing/shortness of breath -- Continue supplemental oxygen, maintain SpO2 greater than 88%, on 2 L nasal cannula at baseline -- Ambulatory O2 screen today on his home 2 L nasal cannula to evaluate further desaturation  Chronic diastolic congestive heart failure, compensated TTE 04/18/2022 with LVEF 50-55%, LV with no regional wall trauma maladies, RV mildly enlarged, trivial MR, IVC normal in size.  Follows with cardiology outpatient, Dr. Duke Salvia. --Furosemide 20 g p.o. daily as needed edema --Strict I's and O's and daily weights  Hyperlipidemia -- Crestor 40 mg p.o. daily  Severe protein calorie malnutrition Body mass index is 17.63 kg/m. Nutrition Status: Nutrition Problem: Severe Malnutrition Etiology: social / environmental circumstances Signs/Symptoms: severe fat depletion, severe muscle depletion Interventions: Ensure Enlive (each supplement provides 350kcal and 20 grams of protein), Liberalize Diet, MVI -- Seen by dietitian, continue to encourage increased oral intake, supplementation  GERD -- Famotidine 20 mg p.o. twice daily PRN heartburn  Weakness/debility/gait disturbance: -- Seen by PT/OT with no recommendations on discharge   DVT prophylaxis: enoxaparin (LOVENOX) injection 40 mg Start: 04/09/23 1930    Code Status: Full Code Family Communication: No family present at bedside this morning  Disposition Plan:  Level of care: Med-Surg Status is: Inpatient Remains inpatient appropriate because: Amatory O2 screen once again today, anticipate discharge back to boarding home in 1-2 days    Consultants:  None  Procedures:  None  Antimicrobials:  Doxycycline   Subjective: Patient seen examined bedside, resting calmly.  Lying in bed.  Reports dyspnea much  improved since ED presentation.  Remains on 2 L nasal cannula which is his baseline with SpO2 98% at rest.  Did desaturate while  working with physical therapy yesterday.  Completed steroids today.  Remains on doxycycline and neb treatments.  Discussed with patient will repeat ambulatory O2 screen today, anticipate discharge back to boarding home in 1-2 days.  No other specific questions or concerns at this time.  Denies headache, no visual changes, no chest pain, no palpitations, no wheezing, no nausea/vomiting/diarrhea, no fever/chills/night sweats, no focal weakness, no cough/congestion, no paresthesias.  No acute events overnight per nursing staff.  Objective: Vitals:   04/12/23 2027 04/13/23 0519 04/13/23 0735 04/13/23 0849  BP:  117/78 104/77   Pulse: 92 88 85 87  Resp: 19 17 16 18   Temp:   98.4 F (36.9 C)   TempSrc:   Oral   SpO2:  98% 98% 98%  Weight:      Height:        Intake/Output Summary (Last 24 hours) at 04/13/2023 1045 Last data filed at 04/13/2023 0700 Gross per 24 hour  Intake 720 ml  Output 1550 ml  Net -830 ml   Filed Weights   04/09/23 1433  Weight: 59 kg    Examination:  Physical Exam: GEN: NAD, alert and oriented x 3, chronically ill appearance, thin/cachectic, appears older than stated age HEENT: NCAT, PERRL, EOMI, sclera clear, MMM, poor dentition PULM: Breath sounds slight diminished bilateral bases, no wheezes/crackles, normal respiratory effort without accessory muscle use, on 2 L nasal cannula with SpO2 98% at rest which is his baseline CV: RRR w/o M/G/R GI: abd soft, NTND, NABS, no R/G/M MSK: no peripheral edema, muscle strength globally intact 5/5 bilateral upper/lower extremities NEURO: CN II-XII intact, no focal deficits, sensation to light touch intact PSYCH: normal mood/affect Integumentary: No concerning rashes/lesions/wounds noted on exposed skin surfaces    Data Reviewed: I have personally reviewed following labs and imaging studies  CBC: Recent Labs  Lab 04/09/23 1429 04/09/23 2246 04/10/23 0607  WBC 7.2 5.9 5.6  HGB 12.0* 11.8* 11.5*  HCT 40.4 39.7  38.3*  MCV 104.4* 103.9* 103.2*  PLT 149* 139* 144*   Basic Metabolic Panel: Recent Labs  Lab 04/09/23 1429 04/09/23 2117 04/10/23 0607  NA 142  --  139  K 3.8  --  4.5  CL 87*  --  87*  CO2 40*  --  44*  GLUCOSE 169*  --  126*  BUN 21  --  16  CREATININE 0.69 0.65 0.74  CALCIUM 8.6*  --  8.7*   GFR: Estimated Creatinine Clearance: 78.9 mL/min (by C-G formula based on SCr of 0.74 mg/dL). Liver Function Tests: Recent Labs  Lab 04/10/23 0607  AST 19  ALT 15  ALKPHOS 41  BILITOT 0.1*  PROT 6.3*  ALBUMIN 3.2*   No results for input(s): "LIPASE", "AMYLASE" in the last 168 hours. No results for input(s): "AMMONIA" in the last 168 hours. Coagulation Profile: No results for input(s): "INR", "PROTIME" in the last 168 hours. Cardiac Enzymes: No results for input(s): "CKTOTAL", "CKMB", "CKMBINDEX", "TROPONINI" in the last 168 hours. BNP (last 3 results) No results for input(s): "PROBNP" in the last 8760 hours. HbA1C: No results for input(s): "HGBA1C" in the last 72 hours. CBG: Recent Labs  Lab 04/09/23 1812  GLUCAP 114*   Lipid Profile: No results for input(s): "CHOL", "HDL", "LDLCALC", "TRIG", "CHOLHDL", "LDLDIRECT" in the last 72 hours. Thyroid Function Tests: No results for input(s): "TSH", "T4TOTAL", "  FREET4", "T3FREE", "THYROIDAB" in the last 72 hours. Anemia Panel: No results for input(s): "VITAMINB12", "FOLATE", "FERRITIN", "TIBC", "IRON", "RETICCTPCT" in the last 72 hours. Sepsis Labs: No results for input(s): "PROCALCITON", "LATICACIDVEN" in the last 168 hours.  Recent Results (from the past 240 hour(s))  SARS Coronavirus 2 by RT PCR (hospital order, performed in [facility] hospital lab) *cepheid single result test* Anterior Nasal Swab     Status: None   Collection Time: 04/09/23  5:48 PM   Specimen: Anterior Nasal Swab  Result Value Ref Range Status   SARS Coronavirus 2 by RT PCR NEGATIVE NEGATIVE Final    Comment: Performed at [facility],  [address], [place], [place] [address]         Radiology Studies: No results found.      Scheduled Meds:  arformoterol  15 mcg Nebulization BID   And   umeclidinium bromide  1 puff Inhalation Daily   aspirin EC  81 mg Oral Daily   cholecalciferol  2,000 Units Oral Daily   docusate sodium  100 mg Oral Daily   doxycycline  100 mg Oral Q12H   enoxaparin (LOVENOX) injection  40 mg Subcutaneous Q24H   feeding supplement  237 mL Oral BID BM   multivitamin with minerals  1 tablet Oral Daily   rosuvastatin  40 mg Oral Daily   vitamin B-12  100 mcg Oral Daily   Continuous Infusions:   LOS: 4 days    Time spent: 50 minutes spent on chart review, discussion with nursing staff, consultants, updating family and interview/physical exam; more than 50% of that time was spent in counseling and/or coordination of care.    Alvira Philips [place], DO [facility] Available via Epic secure chat 7am-7pm After these hours, please refer to coverage provider listed on [email or web address] 04/13/2023, 10:45 AM

## 2023-04-14 DIAGNOSIS — J441 Chronic obstructive pulmonary disease with (acute) exacerbation: Secondary | ICD-10-CM | POA: Diagnosis not present

## 2023-04-14 DIAGNOSIS — E43 Unspecified severe protein-calorie malnutrition: Secondary | ICD-10-CM | POA: Diagnosis not present

## 2023-04-14 NOTE — Progress Notes (Signed)
Mobility Specialist Progress Note    04/14/23 1506  Mobility  Activity Ambulated with assistance in hallway  Level of Assistance Contact guard assist, steadying assist  Assistive Device Front wheel walker  Distance Ambulated (ft) 220 ft  Activity Response Tolerated fair  Mobility Referral Yes  $Mobility charge 1 Mobility  Mobility Specialist Start Time (ACUTE ONLY) 1450  Mobility Specialist Stop Time (ACUTE ONLY) 1505  Mobility Specialist Time Calculation (min) (ACUTE ONLY) 15 min   Pt received in bed and agreeable. No complaints. Encouraged pursed lip breathing. Took a few short standing rest breaks to catch his breath. SpO2 pleth unreliable throughout session. Returned to sitting EOB with call bell in reach.   [place] Mobility Specialist  Please Neurosurgeon or Rehab Office at [phone]

## 2023-04-14 NOTE — Plan of Care (Signed)

## 2023-04-14 NOTE — Progress Notes (Signed)
PROGRESS NOTE    Anthony Nelson  UYQ:[id number] DOB: Nov 14, 1960 DOA: 04/09/2023 PCP: Storm Frisk, MD    Brief Narrative:   Anthony Nelson is a 62 y.o. male [employer] veteran with past medical history significant for COPD, chronic hypoxic respite failure on 2 L nasal cannula at baseline, chronic diastolic congestive heart failure, hyperlipidemia, GERD, severe protein calorie malnutrition, homelessness currently living in a boarding home who presented to [facility] ED on 04/09/2023 via EMS with progressive shortness of breath, wheezing.  On EMS arrival patient's SpO2 was noted to be 54% on room air and was subsequently placed on nonrebreather with improvement of SpO2 to 88%.  He was given albuterol, Atrovent, DuoNeb, Solu-Medrol and route to the ED.  Patient denied chest pain.  In the ED, temperature 98.4 F, HR 104, RR 17, BP 117/70, SpO2 100% on 15 L NRB.  WBC 7.2, hemoglobin 12.0, platelet count 149.  Sodium 142, potassium 3.8, chloride 87, CO2 40, glucose 169, BUN 21, creatinine 0.69.  COVID PCR negative.  Chest x-ray with COPD, no active cardiopulmonary disease process.  EDP continued scheduled breathing treatments, IV steroids however patient continued with hypoxia and increased work of breathing.  ABG notable for CO2 retention and patient was placed on BiPAP.  TRH consulted for admission for further evaluation management of COPD exacerbation.  Assessment & Plan:   Acute on chronic hypoxic hypercapnic respiratory failure, POA COPD exacerbation Patient presenting to the ED via EMS with progressive shortness of breath, wheezing.  Was found by EMS with SpO2 54% on room air.  At baseline on 2-3 L nasal cannula.  Currently homeless living in a boarding home.  Reports compliance with his home inhalers; has them readily available.  Patient is afebrile without leukocytosis.  Chest x-ray with no focal consolidation.  Completed 5-day course of steroids. -- Brovana neb twice daily -- Incruse Ellipta 1 puff daily --  Doxycycline 100 mg p.o. every 12 hours -- Albuterol neb every 6 hours as needed wheezing/shortness of breath -- Continue supplemental oxygen, maintain SpO2 greater than 88%, on 2 L nasal cannula at baseline -- Will refer place referral for pulmonology outpatient  Chronic diastolic congestive heart failure, compensated TTE 04/18/2022 with LVEF 50-55%, LV with no regional wall trauma maladies, RV mildly enlarged, trivial MR, IVC normal in size.  Follows with cardiology outpatient, Dr. Duke Salvia. --Furosemide 20 g p.o. daily as needed edema --Strict I's and O's and daily weights  Hyperlipidemia -- Crestor 40 mg p.o. daily  Severe protein calorie malnutrition Body mass index is 17.63 kg/m. Nutrition Status: Nutrition Problem: Severe Malnutrition Etiology: social / environmental circumstances Signs/Symptoms: severe fat depletion, severe muscle depletion Interventions: Ensure Enlive (each supplement provides 350kcal and 20 grams of protein), Liberalize Diet, MVI -- Seen by dietitian, continue to encourage increased oral intake, supplementation  GERD -- Famotidine 20 mg p.o. twice daily PRN heartburn  Weakness/debility/gait disturbance: -- Seen by PT/OT with no recommendations on discharge   DVT prophylaxis: enoxaparin (LOVENOX) injection 40 mg Start: 04/09/23 1930    Code Status: Full Code Family Communication: No family present at bedside this morning  Disposition Plan:  Level of care: Med-Surg Status is: Inpatient Remains inpatient appropriate because: Anticipate discharge home tomorrow    Consultants:  None  Procedures:  None  Antimicrobials:  Doxycycline   Subjective: Patient seen examined bedside, resting calmly.  Lying in bed.  Reports dyspnea much improved since ED presentation.  Remains on 2 L nasal cannula which is his baseline with SpO2 100%  at rest.   Remains on doxycycline and neb treatments.  Discussed with patient will repeat ambulatory O2 screen today,  anticipate discharge back to boarding home in likely tomorrow.  Requesting referral to pulmonology outpatient.  No other specific questions or concerns at this time.  Denies headache, no visual changes, no chest pain, no palpitations, no wheezing, no nausea/vomiting/diarrhea, no fever/chills/night sweats, no focal weakness, no cough/congestion, no paresthesias.  No acute events overnight per nursing staff.  Objective: Vitals:   04/13/23 2004 04/14/23 0423 04/14/23 0835 04/14/23 0914  BP: 104/66 99/64 (!) 88/59 104/64  Pulse: 81 79 84 87  Resp: 18 18 18    Temp: 97.9 F (36.6 C) 97.7 F (36.5 C) 98 F (36.7 C)   TempSrc: Oral Oral    SpO2: 96% 100% 96% 93%  Weight:      Height:        Intake/Output Summary (Last 24 hours) at 04/14/2023 1157 Last data filed at 04/13/2023 1702 Gross per 24 hour  Intake 480 ml  Output 400 ml  Net 80 ml   Filed Weights   04/09/23 1433  Weight: 59 kg    Examination:  Physical Exam: GEN: NAD, alert and oriented x 3, chronically ill appearance, thin/cachectic, appears older than stated age HEENT: NCAT, PERRL, EOMI, sclera clear, MMM, poor dentition PULM: Breath sounds slight diminished bilateral bases, no wheezes/crackles, normal respiratory effort without accessory muscle use, on 2 L nasal cannula with SpO2 98% at rest which is his baseline CV: RRR w/o M/G/R GI: abd soft, NTND, NABS, no R/G/M MSK: no peripheral edema, muscle strength globally intact 5/5 bilateral upper/lower extremities NEURO: CN II-XII intact, no focal deficits, sensation to light touch intact PSYCH: normal mood/affect Integumentary: No concerning rashes/lesions/wounds noted on exposed skin surfaces    Data Reviewed: I have personally reviewed following labs and imaging studies  CBC: Recent Labs  Lab 04/09/23 1429 04/09/23 2246 04/10/23 0607  WBC 7.2 5.9 5.6  HGB 12.0* 11.8* 11.5*  HCT 40.4 39.7 38.3*  MCV 104.4* 103.9* 103.2*  PLT 149* 139* 144*   Basic Metabolic  Panel: Recent Labs  Lab 04/09/23 1429 04/09/23 2117 04/10/23 0607  NA 142  --  139  K 3.8  --  4.5  CL 87*  --  87*  CO2 40*  --  44*  GLUCOSE 169*  --  126*  BUN 21  --  16  CREATININE 0.69 0.65 0.74  CALCIUM 8.6*  --  8.7*   GFR: Estimated Creatinine Clearance: 78.9 mL/min (by C-G formula based on SCr of 0.74 mg/dL). Liver Function Tests: Recent Labs  Lab 04/10/23 0607  AST 19  ALT 15  ALKPHOS 41  BILITOT 0.1*  PROT 6.3*  ALBUMIN 3.2*   No results for input(s): "LIPASE", "AMYLASE" in the last 168 hours. No results for input(s): "AMMONIA" in the last 168 hours. Coagulation Profile: No results for input(s): "INR", "PROTIME" in the last 168 hours. Cardiac Enzymes: No results for input(s): "CKTOTAL", "CKMB", "CKMBINDEX", "TROPONINI" in the last 168 hours. BNP (last 3 results) No results for input(s): "PROBNP" in the last 8760 hours. HbA1C: No results for input(s): "HGBA1C" in the last 72 hours. CBG: Recent Labs  Lab 04/09/23 1812  GLUCAP 114*   Lipid Profile: No results for input(s): "CHOL", "HDL", "LDLCALC", "TRIG", "CHOLHDL", "LDLDIRECT" in the last 72 hours. Thyroid Function Tests: No results for input(s): "TSH", "T4TOTAL", "FREET4", "T3FREE", "THYROIDAB" in the last 72 hours. Anemia Panel: No results for input(s): "VITAMINB12", "FOLATE", "FERRITIN", "  TIBC", "IRON", "RETICCTPCT" in the last 72 hours. Sepsis Labs: No results for input(s): "PROCALCITON", "LATICACIDVEN" in the last 168 hours.  Recent Results (from the past 240 hour(s))  SARS Coronavirus 2 by RT PCR (hospital order, performed in [facility] hospital lab) *cepheid single result test* Anterior Nasal Swab     Status: None   Collection Time: 04/09/23  5:48 PM   Specimen: Anterior Nasal Swab  Result Value Ref Range Status   SARS Coronavirus 2 by RT PCR NEGATIVE NEGATIVE Final    Comment: Performed at [facility], [address], [place], [place] [address]         Radiology Studies: No  results found.      Scheduled Meds:  arformoterol  15 mcg Nebulization BID   And   umeclidinium bromide  1 puff Inhalation Daily   aspirin EC  81 mg Oral Daily   cholecalciferol  2,000 Units Oral Daily   docusate sodium  100 mg Oral Daily   doxycycline  100 mg Oral Q12H   enoxaparin (LOVENOX) injection  40 mg Subcutaneous Q24H   feeding supplement  237 mL Oral BID BM   multivitamin with minerals  1 tablet Oral Daily   rosuvastatin  40 mg Oral Daily   senna-docusate  1 tablet Oral BID   vitamin B-12  100 mcg Oral Daily   Continuous Infusions:   LOS: 5 days    Time spent: 48 minutes spent on chart review, discussion with nursing staff, consultants, updating family and interview/physical exam; more than 50% of that time was spent in counseling and/or coordination of care.    Alvira Philips [place], DO [facility] Available via Epic secure chat 7am-7pm After these hours, please refer to coverage provider listed on [email or web address] 04/14/2023, 11:57 AM

## 2023-04-14 NOTE — TOC Progression Note (Signed)
Transition of Care [facility]) - Progression Note    Patient Details  Name: Anthony Nelson MRN: [id number] Date of Birth: March 14, 1961  Transition of [facility] CM/SW Contact  Ronny Bacon, RN Phone Number: 04/14/2023, 4:23 PM  Clinical Narrative:   Order noted for home oxygen 4L. Spoke with patient by phone reports that he gets home oxygen from [employer] and that he is usually on 2L. Call to [facility] [employer], patient concentrator goes to 10L at home. Patient reports that he has his inogen with him but does not have his charger. Encouraged patient to have family bring his charger so that he can have oxygen when he is discharged home. Recommendations for Rollator noted from therapy, call to [facility] [employer] to order Rollator. Per [employer], pt received Rollator in 03/2020 and would have to pay out of pocket for another one at the cost of about $125. Patient agrees to pay cost for new Rollator. [employer] made aware.         Expected Discharge Plan and Services                                               Social Determinants of Health (SDOH) Interventions SDOH Screenings   Food Insecurity: Food Insecurity Present (04/10/2023)  Housing: Medium Risk (04/10/2023)  Transportation Needs: Unmet Transportation Needs (04/10/2023)  Utilities: Not At Risk (04/10/2023)  Alcohol Screen: Low Risk  (12/26/2022)  Depression (PHQ2-9): Low Risk  (01/16/2023)  Physical Activity: Insufficiently Active (12/26/2022)  Social Connections: Moderately Isolated (12/26/2022)  Stress: No Stress Concern Present (12/26/2022)  Tobacco Use: Medium Risk (04/09/2023)    Readmission Risk Interventions     No data to display

## 2023-04-15 ENCOUNTER — Other Ambulatory Visit (HOSPITAL_COMMUNITY): Payer: Self-pay

## 2023-04-15 DIAGNOSIS — J441 Chronic obstructive pulmonary disease with (acute) exacerbation: Secondary | ICD-10-CM | POA: Diagnosis not present

## 2023-04-15 DIAGNOSIS — J9621 Acute and chronic respiratory failure with hypoxia: Secondary | ICD-10-CM | POA: Diagnosis not present

## 2023-04-15 MED ORDER — MAGNESIUM CITRATE PO SOLN
1.0000 | Freq: Once | ORAL | Status: AC
  Administered 2023-04-15: 1 via ORAL
  Filled 2023-04-15: qty 296

## 2023-04-15 MED ORDER — DOXYCYCLINE HYCLATE 100 MG PO TABS
100.0000 mg | ORAL_TABLET | Freq: Two times a day (BID) | ORAL | 0 refills | Status: AC
  Filled 2023-04-15: qty 5, 3d supply, fill #0

## 2023-04-15 NOTE — Progress Notes (Signed)
Occupational Therapy Treatment Patient Details Name: Anthony Nelson MRN: [id number] DOB: June 17, 1961 Today's Date: 04/15/2023   History of present illness Patient is a 62 yo male presenting to the ED with AMS and desatting to 54% on 04/09/23. Pt with copd exacerbation. PMH includes: COPD, diastolic heart failure, GERD, pulmonary hypertension   OT comments  Patient supine in bed upon arrival into room and agreebale to particicpate in OT intervention this date.  Patient completed supine to sit with Supervision wit HOB elevated, sit to stand and functional mobnility into bathroom completed wit rollator and CGA/Supervision.  Paeiotn completed UB grooming/hygiene at sink while seated on rol;latopre fopr EC technique.  Patient completed tasks with Supervision and increased time with min verbal cueing to use PLB and to take rest breaks as needed during grooming tasks.  Patient would benefit from further EC/WS techniques and implementing them into his daily routine.  Patient would benefit from additional OT intervention to address functional deficits in order for patient to return to PLOF.  Patient desats to 83% on 3 liters with activity and requires 2-3 mins to re-bound to 90% on 3 liters.      If plan is discharge home, recommend the following:  A little help with walking and/or transfers;A little help with bathing/dressing/bathroom;Assist for transportation;Help with stairs or ramp for entrance   Equipment Recommendations  Other (comment)    Recommendations for Other Services      Precautions / Restrictions Precautions Precautions: Fall Precaution Comments: watch O2 Restrictions Weight Bearing Restrictions: No       Mobility Bed Mobility Overal bed mobility: Modified Independent Bed Mobility: Supine to Sit     Supine to sit: Supervision Sit to supine: Supervision   General bed mobility comments: no physical assist provided    Transfers Overall transfer level: Modified  independent Equipment used: Rollator (4 wheels) Transfers: Sit to/from Stand Sit to Stand: Supervision                 Balance                                           ADL either performed or assessed with clinical judgement   ADL Overall ADL's : Needs assistance/impaired Eating/Feeding: Supervision/ safety;Sitting   Grooming: Wash/dry hands;Wash/dry face;Oral care;Applying deodorant;Sitting;Set up Grooming Details (indicate cue type and reason):  (sitting on rollator in front of sink)                             Functional mobility during ADLs: Contact guard assist General ADL Comments:  (patient educated on EC/WS techniques to complete UB grooming task by sitting on rollator along with PLB technique during functional tasks)    Extremity/Trunk Assessment              Vision       Perception     Praxis      Cognition Arousal: Alert Behavior During Therapy: WFL for tasks assessed/performed Overall Cognitive Status: Within Functional Limits for tasks assessed                                 General Comments: Alert and appropriate        Exercises      Shoulder Instructions  General Comments      Pertinent Vitals/ Pain       Pain Assessment Pain Assessment: No/denies pain  Home Living                                          Prior Functioning/Environment              Frequency  Min 1X/week        Progress Toward Goals  OT Goals(current goals can now be found in the care plan section)  Progress towards OT goals: Progressing toward goals  Acute Rehab OT Goals OT Goal Formulation: With patient Time For Goal Achievement: 04/24/23 Potential to Achieve Goals: Good ADL Goals Pt Will Perform Lower Body Bathing: Independently;sit to/from stand;with adaptive equipment;sitting/lateral leans Pt Will Perform Lower Body Dressing: Independently;sitting/lateral leans;sit  to/from stand;with adaptive equipment Pt Will Transfer to Toilet: Independently;ambulating;regular height toilet Pt Will Perform Toileting - Clothing Manipulation and hygiene: Independently;sitting/lateral leans;sit to/from stand Additional ADL Goal #1: Patient will be able to verbalize 3 strategies for energy conservation to promote increased activity tolerance.  Plan      Co-evaluation                 AM-PAC OT "6 Clicks" Daily Activity     Outcome Measure   Help from another person eating meals?: A Little Help from another person taking care of personal grooming?: A Little Help from another person toileting, which includes using toliet, bedpan, or urinal?: A Little Help from another person bathing (including washing, rinsing, drying)?: A Little Help from another person to put on and taking off regular upper body clothing?: A Little Help from another person to put on and taking off regular lower body clothing?: A Little 6 Click Score: 18    End of Session Equipment Utilized During Treatment: Oxygen  OT Visit Diagnosis: Unsteadiness on feet (R26.81);Muscle weakness (generalized) (M62.81)   Activity Tolerance Patient tolerated treatment well   Patient Left in bed;with call bell/phone within reach   Nurse Communication Mobility status        Time: 6295-2841 OT Time Calculation (min): 31 min  Charges: OT General Charges $OT Visit: 1 Visit OT Treatments $Self Care/Home Management : 23-37 mins  Governor Specking OT/L  Denice Paradise 04/15/2023, 11:46 AM

## 2023-04-15 NOTE — Care Plan (Signed)
Patient discharged back to halfway house. Patient in stable condition upon discharge. Given D/C medications from TOC at bedside. All discharge instructions including follow-up appointments and medication regimen explained to patient. PIV to L FA removed. Patient had portable oxygen at bedside for transfer back to facility. Patient being transported by family member. All personal belongings taken with patient.

## 2023-04-15 NOTE — Plan of Care (Signed)

## 2023-04-15 NOTE — Discharge Summary (Signed)
Physician Discharge Summary  Anthony Nelson AOZ:[id number] DOB: May 28, 1961 DOA: 04/09/2023  PCP: Storm Frisk, MD  Admit date: 04/09/2023 Discharge date: 04/15/2023  [facility] Disposition: Halfway house  Recommendations for Outpatient Follow-up:  Follow up with PCP in 1-2 weeks Ambulatory referral placed for outpatient follow-up with pulmonology, further management of his underlying COPD Continue doxycycline to complete 7-day course for COPD exacerbation  Home Health: No needs identified by physical/occupational therapy during hospitalization Equipment/Devices: Oxygen via nasal cannula 2-4L  Discharge Condition: Stable CODE STATUS: Full code Diet recommendation: Heart healthy diet  History of present illness:  Anthony Nelson is a 62 y.o. male [employer] veteran with past medical history significant for COPD, chronic hypoxic respite failure on 2 L nasal cannula at baseline, chronic diastolic congestive heart failure, hyperlipidemia, GERD, severe protein calorie malnutrition, homelessness currently living in a boarding home who presented to [facility] ED on 04/09/2023 via EMS with progressive shortness of breath, wheezing.  On EMS arrival patient's SpO2 was noted to be 54% on room air and was subsequently placed on nonrebreather with improvement of SpO2 to 88%.  He was given albuterol, Atrovent, DuoNeb, Solu-Medrol and route to the ED.  Patient denied chest pain.   In the ED, temperature 98.4 F, HR 104, RR 17, BP 117/70, SpO2 100% on 15 L NRB.  WBC 7.2, hemoglobin 12.0, platelet count 149.  Sodium 142, potassium 3.8, chloride 87, CO2 40, glucose 169, BUN 21, creatinine 0.69.  COVID PCR negative.  Chest x-ray with COPD, no active cardiopulmonary disease process.  EDP continued scheduled breathing treatments, IV steroids however patient continued with hypoxia and increased work of breathing.  ABG notable for CO2 retention and patient was placed on BiPAP.  TRH consulted for admission for  further evaluation management of COPD exacerbation.  Hospital course:  Acute on chronic hypoxic hypercapnic respiratory failure, POA COPD exacerbation Patient presenting to the ED via EMS with progressive shortness of breath, wheezing.  Was found by EMS with SpO2 54% on room air.  At baseline on 2-3 L nasal cannula.  Currently homeless living in a boarding home.  Reports compliance with his home inhalers; has them readily available.  Patient is afebrile without leukocytosis.  Chest x-ray with no focal consolidation.  Completed 5-day course of steroids.  Continue doxycycline for complete 7-day course.  Resume home glycopyrrolate-formoterol inhaler 1 puff twice daily on discharge.  Patient has albuterol MDI/nebulized treatments to use as needed for shortness of breath/wheezing.  Given patient's PCP, Dr. Delford Field who is a pulmonologist retiring, referral placed for outpatient pulmonology follow-up for further assistance in management of his underlying COPD. -  Chronic diastolic congestive heart failure, compensated TTE 04/18/2022 with LVEF 50-55%, LV with no regional wall trauma maladies, RV mildly enlarged, trivial MR, IVC normal in size.  Follows with cardiology outpatient, Dr. Duke Salvia. Furosemide 20 g p.o. daily as needed edema   Hyperlipidemia Crestor 40 mg p.o. daily   Severe protein calorie malnutrition Body mass index is 17.63 kg/m. Nutrition Status: Nutrition Problem: Severe Malnutrition Etiology: social / environmental circumstances Signs/Symptoms: severe fat depletion, severe muscle depletion Interventions: Ensure Enlive (each supplement provides 350kcal and 20 grams of protein), Liberalize Diet, MVI -- Seen by dietitian, continue to encourage increased oral intake, supplementation   GERD Famotidine 20 mg p.o. twice daily PRN heartburn   Weakness/debility/gait disturbance: Seen by PT/OT with no recommendations on discharge  Discharge Diagnoses:  Active Problems:   Former  smoker   Chronic obstructive pulmonary disease (HCC)  Cor pulmonale (chronic) (HCC)   Chronic diastolic heart failure (HCC)   Chronic respiratory failure with hypoxia and hypercapnia (HCC)   Hyperlipidemia   Gastroesophageal reflux disease   COPD exacerbation (HCC)   Protein-calorie malnutrition, severe    Discharge Instructions  Discharge Instructions     Ambulatory referral to Pulmonology   Complete by: As directed    Reason for referral: Asthma/COPD   Call MD for:  difficulty breathing, headache or visual disturbances   Complete by: As directed    Call MD for:  extreme fatigue   Complete by: As directed    Call MD for:  persistant dizziness or light-headedness   Complete by: As directed    Call MD for:  persistant nausea and vomiting   Complete by: As directed    Call MD for:  severe uncontrolled pain   Complete by: As directed    Call MD for:  temperature >100.4   Complete by: As directed    Diet - low sodium heart healthy   Complete by: As directed    Increase activity slowly   Complete by: As directed       Allergies as of 04/15/2023       Reactions   Erythromycin Other (See Comments)   Allergy from childhood- does not take it        Medication List     TAKE these medications    albuterol (2.5 MG/3ML) 0.083% nebulizer solution Commonly known as: PROVENTIL USE 3 MLS BY NEBULIZATION EVERY 6 HOURS AS NEEDED FOR WHEEZING OR SHORTNESS OF BREATH.   Ventolin HFA 108 (90 Base) MCG/ACT inhaler Generic drug: albuterol Inhale 2 puffs into the lungs every 6 hours as needed for wheezing or shortness of breath.   aspirin EC 81 MG tablet Take 1 tablet (81 mg total) by mouth daily. Swallow whole.   B-12 PO Take 1 tablet by mouth daily.   Bevespi Aerosphere 9-4.8 MCG/ACT Aero Generic drug: Glycopyrrolate-Formoterol Inhale 1 puff into the lungs in the morning and at bedtime.   dextromethorphan-guaiFENesin 30-600 MG 12hr tablet Commonly known as: MUCINEX  DM Take 1 tablet by mouth 2 (two) times daily as needed for cough.   docusate sodium 100 MG capsule Commonly known as: COLACE Take 1 capsule (100 mg total) by mouth daily.   doxycycline 100 MG tablet Commonly known as: VIBRA-TABS Take 1 tablet (100 mg total) by mouth every 12 (twelve) hours for 5 doses.   famotidine 20 MG tablet Commonly known as: PEPCID TAKE 1 TABLET (20 MG TOTAL) BY MOUTH 2 (TWO) TIMES DAILY. What changed:  how much to take when to take this reasons to take this   furosemide 20 MG tablet Commonly known as: LASIX Take 1 tablet (20 mg total) by mouth daily as needed. What changed: reasons to take this   OXYGEN Inhale 2 L/min into the lungs continuous.   potassium chloride SA 20 MEQ tablet Commonly known as: KLOR-CON M Take 1 tablet (20 mEq total) by mouth daily as needed. What changed: reasons to take this   rosuvastatin 40 MG tablet Commonly known as: CRESTOR Take 1 tablet (40 mg) by mouth daily.   sodium chloride 0.65 % Soln nasal spray Commonly known as: OCEAN Place 2 sprays into both nostrils in the morning, at noon, and at bedtime. What changed:  when to take this reasons to take this   vitamin C 100 MG tablet Take 1 tablet (100 mg total) by mouth daily.   Vitamin D3  50 MCG (2000 UT) Tabs Take 2,000 Units by mouth daily.   ZINC PO Take 1 tablet by mouth daily.               Durable Medical Equipment  (From admission, onward)           Start     Ordered   04/14/23 1725  For home use only DME 4 wheeled rolling walker with seat  Once       Question:  Patient needs a walker to treat with the following condition  Answer:  Gait disturbance   04/14/23 1724   04/14/23 1252  For home use only DME oxygen  Once       Question Answer Comment  Length of Need Lifetime   Mode or (Route) Nasal cannula   Liters per Minute 4   Frequency Continuous (stationary and portable oxygen unit needed)   Oxygen conserving device Yes   Oxygen  delivery system Gas      04/14/23 1251            Follow-up Information     Storm Frisk, MD Follow up.   Specialty: Pulmonary Disease Contact information: [address] [place] [address] [phone]         [facility] at [facility]. Schedule an appointment as soon as possible for a visit.   Specialty: Pulmonology Contact information: [address] [place] [address] [phone]               Allergies  Allergen Reactions   Erythromycin Other (See Comments)    Allergy from childhood- does not take it    Consultations: None   Procedures/Studies: DG Chest Portable 1 View  Result Date: 04/09/2023 CLINICAL DATA:  COPD.  Altered mental status. EXAM: PORTABLE CHEST 1 VIEW COMPARISON:  09/03/2020. FINDINGS: Bilateral lungs appear hyperexpanded and hyperlucent with coarse bronchovascular markings, in keeping with COPD. Bilateral lungs otherwise appear clear. No dense consolidation. Bilateral costophrenic angles are clear. Normal cardio-mediastinal silhouette. No acute osseous abnormalities. The soft tissues are within normal limits. IMPRESSION: 1. COPD. No active cardiopulmonary disease. Electronically Signed   By: Jules Schick M.D.   On: 04/09/2023 15:30     Subjective: Patient seen examined bedside, resting calmly.  Lying in bed.  RN present.  Breathing/dyspnea much improved since initial presentation.  Remains on 2 L nasal cannula with SpO2 98% at rest but requires up to 4 L with ambulation per mobility notes yesterday.  Discussed given patient's PCP, Dr. Delford Field who is a pulmonologist is retiring will refer to [facility] pulmonology for further management of his COPD moving forward.  Discussed may resume his home inhalers to include glycopyrrolate-formoterol.  [facility]  No other specific questions or concerns at this time.  Denies headache, no visual changes, no  chest pain, no palpitations, no shortness of breath more than his typical baseline, no fever/chills/night sweats, no nausea/vomiting/diarrhea, no focal weakness, no fatigue, no cough/congestion, no paresthesias.  No acute events overnight per nursing staff.  Discharge Exam: Vitals:   04/14/23 2032 04/15/23 0745  BP: (!) 103/58 98/63  Pulse: 99 82  Resp: 18 16  Temp: 98 F (36.7 C) 97.9 F (36.6 C)  SpO2: 98% 97%   Vitals:   04/14/23 1425 04/14/23 2017 04/14/23 2032 04/15/23 0745  BP: 98/68  (!) 103/58 98/63  Pulse:   99 82  Resp: 16  18 16  Temp: 98.2 F (36.8 C)  98 F (36.7 C) 97.9 F (36.6 C)  TempSrc: Oral  Oral   SpO2:  98% 98% 97%  Weight:      Height:        Physical Exam: GEN: NAD, alert and oriented x 3, chronically ill/thin/cachectic in appearance, appears older than stated age HEENT: NCAT, PERRL, EOMI, sclera clear, MMM, poor dentition PULM: CTAB w/o wheezes/crackles, normal respiratory effort, normal respiratory effort without accessory muscle use, on 2 L nasal cannula with SpO2 98% at rest CV: RRR w/o M/G/R GI: abd soft, NTND, NABS, no R/G/M MSK: no peripheral edema, muscle strength globally intact 5/5 bilateral upper/lower extremities NEURO: No focal neurological deficits PSYCH: normal mood/affect Integumentary: No concerning rashes/lesions/wounds noted on exposed skin surfaces    The results of significant diagnostics from this hospitalization (including imaging, microbiology, ancillary and laboratory) are listed below for reference.     Microbiology: Recent Results (from the past 240 hour(s))  SARS Coronavirus 2 by RT PCR (hospital order, performed in [facility] hospital lab) *cepheid single result test* Anterior Nasal Swab     Status: None   Collection Time: 04/09/23  5:48 PM   Specimen: Anterior Nasal Swab  Result Value Ref Range Status   SARS Coronavirus 2 by RT PCR NEGATIVE NEGATIVE Final    Comment: Performed at [facility] Lab, [address] [place], [place] [address]     Labs: BNP (last 3 results) No results for input(s): "BNP" in the last 8760 hours. Basic Metabolic Panel: Recent Labs  Lab 04/09/23 1429 04/09/23 2117 04/10/23 0607  NA 142  --  139  K 3.8  --  4.5  CL 87*  --  87*  CO2 40*  --  44*  GLUCOSE 169*  --  126*  BUN 21  --  16  CREATININE 0.69 0.65 0.74  CALCIUM 8.6*  --  8.7*   Liver Function Tests: Recent Labs  Lab 04/10/23 0607  AST 19  ALT 15  ALKPHOS 41  BILITOT 0.1*  PROT 6.3*  ALBUMIN 3.2*   No results for input(s): "LIPASE", "AMYLASE" in the last 168 hours. No results for input(s): "AMMONIA" in the last 168 hours. CBC: Recent Labs  Lab 04/09/23 1429 04/09/23 2246 04/10/23 0607  WBC 7.2 5.9 5.6  HGB 12.0* 11.8* 11.5*  HCT 40.4 39.7 38.3*  MCV 104.4* 103.9* 103.2*  PLT 149* 139* 144*   Cardiac Enzymes: No results for input(s): "CKTOTAL", "CKMB", "CKMBINDEX", "TROPONINI" in the last 168 hours. BNP: Invalid input(s): "POCBNP" CBG: Recent Labs  Lab 04/09/23 1812  GLUCAP 114*   D-Dimer No results for input(s): "DDIMER" in the last 72 hours. Hgb A1c No results for input(s): "HGBA1C" in the last 72 hours. Lipid Profile No results for input(s): "CHOL", "HDL", "LDLCALC", "TRIG", "CHOLHDL", "LDLDIRECT" in the last 72 hours. Thyroid function studies No results for input(s): "TSH", "T4TOTAL", "T3FREE", "THYROIDAB" in the last 72 hours.  Invalid input(s): "FREET3" Anemia work up No results for input(s): "VITAMINB12", "FOLATE", "FERRITIN", "TIBC", "IRON", "RETICCTPCT" in the last 72 hours. Urinalysis    Component Value Date/Time   COLORURINE STRAW (A) 03/08/2020 1624   APPEARANCEUR CLEAR 03/08/2020 1624   LABSPEC 1.006 03/08/2020 1624   PHURINE 5.0 03/08/2020 1624   GLUCOSEU NEGATIVE 03/08/2020 1624   HGBUR NEGATIVE 03/08/2020 1624   BILIRUBINUR NEGATIVE 03/08/2020 1624   KETONESUR NEGATIVE 03/08/2020 1624   PROTEINUR NEGATIVE 03/08/2020 1624   NITRITE NEGATIVE  03/08/2020 1624   LEUKOCYTESUR  NEGATIVE 03/08/2020 1624   Sepsis Labs Recent Labs  Lab 04/09/23 1429 04/09/23 2246 04/10/23 0607  WBC 7.2 5.9 5.6   Microbiology Recent Results (from the past 240 hour(s))  SARS Coronavirus 2 by RT PCR (hospital order, performed in [facility] hospital lab) *cepheid single result test* Anterior Nasal Swab     Status: None   Collection Time: 04/09/23  5:48 PM   Specimen: Anterior Nasal Swab  Result Value Ref Range Status   SARS Coronavirus 2 by RT PCR NEGATIVE NEGATIVE Final    Comment: Performed at [facility] Lab, [address] [place], [place] [address]     Time coordinating discharge: Over 30 minutes  SIGNED:   Alvira Philips [place], DO  [facility] 04/15/2023, 9:21 AM

## 2023-04-15 NOTE — Plan of Care (Signed)

## 2023-04-15 NOTE — TOC Transition Note (Signed)
Transition of Care [facility]) - CM/SW Discharge Note   Patient Details  Name: Anthony Nelson MRN: [id number] Date of Birth: 01-12-1961  [facility] CM/SW Contact:  Epifanio Lesches, RN Phone Number: 04/15/2023, 10:49 AM   Clinical Narrative:     Patient will DC to: home Anticipated DC date: 04/15/2023 Family notified: yes Transport by: car   Per MD patient ready for DC today. RN, patient, and patient's family notified of DC. DME rollator @ bedside. Pt states has portable oxygen for ride to home. TOC pharmacy to deliver RX meds to bedside prior to d/c.  Post hospital f/u noted on AVS.  Family to provide transportation to home.  RNCM will sign off for now as intervention is no longer needed. Please consult [place] again if new needs arise.   Final next level of care: Home/Self Care Barriers to Discharge: No Barriers Identified   Patient Goals and CMS Choice      Discharge Placement                         Discharge Plan and Services Additional resources added to the After Visit Summary for                                       Social Determinants of Health (SDOH) Interventions SDOH Screenings   Food Insecurity: Food Insecurity Present (04/10/2023)  Housing: Medium Risk (04/10/2023)  Transportation Needs: Unmet Transportation Needs (04/10/2023)  Utilities: Not At Risk (04/10/2023)  Alcohol Screen: Low Risk  (12/26/2022)  Depression (PHQ2-9): Low Risk  (01/16/2023)  Physical Activity: Insufficiently Active (12/26/2022)  Social Connections: Moderately Isolated (12/26/2022)  Stress: No Stress Concern Present (12/26/2022)  Tobacco Use: Medium Risk (04/09/2023)     Readmission Risk Interventions     No data to display

## 2023-04-15 NOTE — Progress Notes (Signed)
Mobility Specialist: Progress Note   04/15/23 1348  Mobility  Activity Ambulated with assistance in hallway  Level of Assistance Standby assist, set-up cues, supervision of patient - no hands on  Assistive Device Four wheel walker  Distance Ambulated (ft) 300 ft  Activity Response Tolerated fair  Mobility Referral Yes  $Mobility charge 1 Mobility  Mobility Specialist Start Time (ACUTE ONLY) 0945  Mobility Specialist Stop Time (ACUTE ONLY) 1021  Mobility Specialist Time Calculation (min) (ACUTE ONLY) 36 min    Pre-Mobility: SpO2 88-90% 3LO2 During Mobility: SpO2 70-85% 3LO2, 87-91% 4LO2 Post-Mobility: SpO2 88-91% 3LO2  Pt was agreeable to mobility session - received in bed. Expressed that he had some anxiety about walking but wasn't really sure why. SV throughout. Desat 2x during ambulation to 70-85% 3LO2. Took 1x seated break (~7min) and titrated to 4LO2 - pt maintained sat on SpO2 87-91% 4LO2 throughout remainder of session. Returned to room without fault. Left in bed on 3LO2, maintained SpO2 88-91% with all needs met, call bell in reach.   Maurene Capes Mobility Specialist Please contact via SecureChat or Rehab office at [phone]

## 2023-04-16 ENCOUNTER — Telehealth: Payer: Self-pay

## 2023-04-16 NOTE — Transitions of Care (Post Inpatient/ED Visit) (Signed)
04/16/2023  Name: Anthony Nelson MRN: [id number] DOB: 03/20/1961  Today's TOC FU Call Status: Today's TOC FU Call Status:: Successful TOC FU Call Completed TOC FU Call Complete Date: 04/16/23 Patient's Name and Date of Birth confirmed.  Transition Care Management Follow-up Telephone Call Date of Discharge: 04/15/23 Discharge Facility: [facility]) Type of Discharge: Inpatient Admission Primary Inpatient Discharge Diagnosis:: "COPD,unspecified type" How have you been since you were released from the hospital?: Better (pt voices that he rested fairly well-no pain, appetite fair, using oxygen as ordered at 2l/min gets "a little winded with exertion but relieved with rest'-using inhaler and neb txs) Any questions or concerns?: No  Items Reviewed: Did you receive and understand the discharge instructions provided?: Yes Medications obtained,verified, and reconciled?: Yes (Medications Reviewed) Any new allergies since your discharge?: No Dietary orders reviewed?: Yes Type of Diet Ordered:: low salt/heart healthy Do you have support at home?: No  Medications Reviewed Today: Medications Reviewed Today     Reviewed by Charlyn Minerva, RN (Registered Nurse) on 04/16/23 at 1430  Med List Status: <None>   Medication Order Taking? Sig Documenting Provider Last Dose Status Informant  albuterol (PROVENTIL) (2.5 MG/3ML) 0.083% nebulizer solution [id number] Yes USE 3 MLS BY NEBULIZATION EVERY 6 HOURS AS NEEDED FOR WHEEZING OR SHORTNESS OF BREATH. Storm Frisk, MD Taking Active Self  albuterol (VENTOLIN HFA) 108 (90 Base) MCG/ACT inhaler [id number] Yes Inhale 2 puffs into the lungs every 6 hours as needed for wheezing or shortness of breath. Storm Frisk, MD Taking Active Self  Ascorbic Acid (VITAMIN C) 100 MG tablet [id number] Yes Take 1 tablet (100 mg total) by mouth daily. Storm Frisk, MD Taking Active Self  aspirin EC 81 MG tablet [id number] Yes Take 1 tablet (81 mg  total) by mouth daily. Swallow whole. Storm Frisk, MD Taking Active Self  Cholecalciferol (VITAMIN D3) 50 MCG (2000 UT) TABS [id number] Yes Take 2,000 Units by mouth daily. Storm Frisk, MD Taking Active Self  Cyanocobalamin (B-12 PO) [id number] Yes Take 1 tablet by mouth daily. [provider] Taking Active Self  dextromethorphan-guaiFENesin (MUCINEX DM) 30-600 MG 12hr tablet [id number] Yes Take 1 tablet by mouth 2 (two) times daily as needed for cough. Storm Frisk, MD Taking Active Self  docusate sodium (COLACE) 100 MG capsule [id number] Yes Take 1 capsule (100 mg total) by mouth daily. Storm Frisk, MD Taking Active Self  doxycycline (VIBRA-TABS) 100 MG tablet [id number] Yes Take 1 tablet (100 mg total) by mouth every 12 (twelve) hours for 5 doses. [place], Alvira Philips, DO Taking Active   famotidine (PEPCID) 20 MG tablet [id number] Yes TAKE 1 TABLET (20 MG TOTAL) BY MOUTH 2 (TWO) TIMES DAILY.  Patient taking differently: Take 20 mg by mouth 2 (two) times daily as needed for heartburn or indigestion.   Storm Frisk, MD Taking Active Self  furosemide (LASIX) 20 MG tablet [id number]  Take 1 tablet (20 mg total) by mouth daily as needed.  Patient taking differently: Take 20 mg by mouth daily as needed for fluid.   Storm Frisk, MD  Expired 04/09/23 2359 Self           Med Note (SATTER[place], DARIUS E   Tue Apr 09, 2023  4:50 PM) Still has on hand as needed   Glycopyrrolate-Formoterol (BEVESPI AEROSPHERE) 9-4.8 MCG/ACT AERO [id number] Yes Inhale 1 puff into the lungs in the morning and at bedtime. Storm Frisk, MD Taking Active Self  Multiple  Vitamins-Minerals (ZINC PO) [id number] Yes Take 1 tablet by mouth daily. [provider] Taking Active Self  OXYGEN [id number] Yes Inhale 2 L/min into the lungs continuous.  [provider] Taking Active Self  potassium chloride SA (KLOR-CON M) 20 MEQ tablet [id number]  Take 1 tablet (20 mEq total) by mouth daily as  needed.  Patient taking differently: Take 20 mEq by mouth daily as needed (low potassium).   Storm Frisk, MD  Expired 04/09/23 2359 Self  rosuvastatin (CRESTOR) 40 MG tablet [id number] Yes Take 1 tablet (40 mg) by mouth daily. Storm Frisk, MD Taking Active Self  sodium chloride (OCEAN) 0.65 % SOLN nasal spray [id number] Yes Place 2 sprays into both nostrils in the morning, at noon, and at bedtime.  Patient taking differently: Place 2 sprays into both nostrils 3 (three) times daily as needed for congestion.   Storm Frisk, MD Taking Active Self            Home Care and Equipment/Supplies: Were Home Health Services Ordered?: NA Any new equipment or medical supplies ordered?: Yes Name of Medical supply agency?: [employer] walk[employer] Were you able to get the equipment/medical supplies?: Yes Do you have any questions related to the use of the equipment/supplies?: No  Functional Questionnaire: Do you need assistance with bathing/showering or dressing?: No Do you need assistance with meal preparation?: No Do you need assistance with eating?: No Do you have difficulty maintaining continence: No Do you need assistance with getting out of bed/getting out of a chair/moving?: No Do you have difficulty managing or taking your medications?: No  Follow up appointments reviewed: PCP Follow-up appointment confirmed?: Yes Date of PCP follow-up appointment?: 05/13/23 Follow-up Provider: [facility] Follow-up appointment confirmed?: No Reason Specialist Follow-Up Not Confirmed: Patient has Specialist Provider Number and will Call for Appointment (pt aware that he has been referred to pulmonology and should expect call from office within a few days-advised to follow up with office if he does not hear from them-confirmed he has office contact info) Do you need transportation to your follow-up appointment?: No (pt confirms he will call to arrange ride through  Ly[employer]) Do you understand care options if your condition(s) worsen?: Yes-patient verbalized understanding  SDOH Interventions Today    Flowsheet Row Most Recent Value  SDOH Interventions   Housing Interventions Other (Comment)  [staying in boarding home-declined need for resources-should be moving within a few wks]  Transportation Interventions Other (Comment)  [pt declined-states he uses [employer] andi s reimbursed by insurance, states case worker getting [facility] transportation services arranged for him as well]      TOC Interventions Today    Flowsheet Row Most Recent Value  TOC Interventions   TOC Interventions Discussed/Reviewed TOC Interventions Discussed, Arranged PCP follow up less than 12 days/Care Guide scheduled      Interventions Today    Flowsheet Row Most Recent Value  Chronic Disease   Chronic disease during today's visit Chronic Obstructive Pulmonary Disease (COPD)  General Interventions   General Interventions Discussed/Reviewed General Interventions Discussed, Doctor Visits, Referral to Nurse, Durable Medical Equipment (DME)  [pt declined weekly TOC calls-provided with RN CM contact info and advised to call for any needs or concerns]  Doctor Visits Discussed/Reviewed Doctor Visits Discussed, PCP, Specialist  Durable Medical Equipment (DME) Oxygen, Dan Humphreys, Other  [pulse ox-pt voices he had pulse ox but lost it-will get another one, oxygen in home-wearing it at 2L/min cont,has neb machine-using it as ordered, new walker in place  and pt voices he has been using it]  PCP/Specialist Visits Compliance with follow-up visit  Education Interventions   Education Provided Provided Education  Provided Verbal Education On Medication, When to see the doctor, Other  [resp sx mgmt & action plan]  Nutrition Interventions   Nutrition Discussed/Reviewed Nutrition Discussed, Fluid intake, Adding fruits and vegetables, Decreasing salt  Pharmacy Interventions   Pharmacy Dicussed/Reviewed Pharmacy  Topics Discussed, Medications and their functions  Safety Interventions   Safety Discussed/Reviewed Safety Discussed, Home Safety  Home Safety Assistive Devices       Antionette Fairy, RN,BSN,CCM RN Care Manager Transitions of Care  [place]  Direct Phone: [phone] Toll Free: [phone] Fax: [phone]

## 2023-04-24 ENCOUNTER — Ambulatory Visit: Payer: Self-pay | Admitting: *Deleted

## 2023-04-24 ENCOUNTER — Other Ambulatory Visit (HOSPITAL_COMMUNITY): Payer: Self-pay

## 2023-04-24 ENCOUNTER — Other Ambulatory Visit: Payer: Self-pay

## 2023-04-24 ENCOUNTER — Telehealth: Payer: Self-pay | Admitting: Pharmacist

## 2023-04-24 MED ORDER — UMECLIDINIUM BROMIDE 62.5 MCG/ACT IN AEPB
1.0000 | INHALATION_SPRAY | Freq: Every day | RESPIRATORY_TRACT | 3 refills | Status: DC
Start: 2023-04-24 — End: 2023-05-10
  Filled 2023-04-24 (×2): qty 30, 30d supply, fill #0

## 2023-04-24 NOTE — Addendum Note (Signed)
Addended by: Shan Levans E on: 04/24/2023 12:14 PM   Modules accepted: Orders

## 2023-04-24 NOTE — Telephone Encounter (Signed)
Refill on inhaled medications obtained Incruse sent to pharmacy

## 2023-04-24 NOTE — Telephone Encounter (Signed)
Hey friend,   Can we try to get PA approval for Incruse?   Patient has tried and failed the following: Symbicort, Dulera, Bevespi, and Spiriva.

## 2023-04-24 NOTE — Telephone Encounter (Signed)
  Chief Complaint: Requesting to go back on the Incruse.   Eyvonne Left is not working as well.  Symptoms: The Incruse is only once a day while the Bevespi is twice a day.   The once a day dosing works much better for me. Frequency: He is almost out of the [place] so wanted a refill of the Incruse before he ran out of the [place]. Pertinent Negatives: Patient denies N/A Disposition: [] ED /[] Urgent Care (no appt availability in office) / [] Appointment(In office/virtual)/ []  [facility]/ [] Home Care/ [] Refused Recommended Disposition /[] [facility] [facility]/ [x]  Follow-up with PCP Additional Notes: Message sent to Dr. Delford Field with pt's request.

## 2023-04-24 NOTE — Telephone Encounter (Signed)
Message from [place] E sent at 04/24/2023 10:57 AM EDT  Summary: Medication change   Pt wants to change his Glycopyrrolate-Formoterol (BEVESPI AEROSPHERE) 9-4.8 MCG/ACT AERO back to Incruse Ellipta, says the bevespi is not working for him  [facility] [address] [place] [address] Phone: [phone] Fax: [phone]          Call History  Contact Date/Time Type Contact Phone/Fax User  04/24/2023 10:57 AM EDT Phone (Incoming) Anthony, Nelson (Self) [phone] Judie Petit) Randol Kern   Reason for Disposition  [1] Caller has URGENT medicine question about med that PCP or specialist prescribed AND [2] triager unable to answer question    Requesting to be put back on the Incruse instead of the Bevespi.  Answer Assessment - Initial Assessment Questions 1. NAME of MEDICINE: "What medicine(s) are you calling about?"     Bevespi Aerosphere  2. QUESTION: "What is your question?" (e.g., double dose of medicine, side effect)     It's not working as well.    I would like to go back to the Incruse.    Dr. Delford Field wanted me to try the [facility] and it worked for a while.  I want to go back to the Incruse.   I was originally on it before starting the Bevespi.    The Incruse was only once a day.   The Eyvonne Left is twice a day which is harder to manage.   I've gone through my divorce and separation and I'm doing better.   Let Dr. Delford Field know that.    3. PRESCRIBER: "Who prescribed the medicine?" Reason: if prescribed by specialist, call should be referred to that group.     Dr. Shan Levans  4. SYMPTOMS: "Do you have any symptoms?" If Yes, ask: "What symptoms are you having?"  "How bad are the symptoms (e.g., mild, moderate, severe)     I do better on the Incruse.   I'm almost out of the [place] so I need the Incruse before I run of the [place].  5. PREGNANCY:  "Is there any chance that you are pregnant?" "When was your last  menstrual period?"     N/A  Protocols used: Medication Question Call-A-AH

## 2023-04-25 ENCOUNTER — Other Ambulatory Visit: Payer: Self-pay

## 2023-04-25 ENCOUNTER — Other Ambulatory Visit (HOSPITAL_COMMUNITY): Payer: Self-pay

## 2023-04-25 NOTE — Telephone Encounter (Signed)
Patient aware the Rx was mailed to home. He has enough to last him until it arrives tomorrow, 04/26/2023 or on Monday.  He states he would like to keep status for medications to be mailed out.

## 2023-04-25 NOTE — Telephone Encounter (Signed)
Message was sent 21 hours ago to Kathie Dike, [place], for the Prior Auth.  Advised patient.   Also advised sometimes it can take longer that 1 day but we would be in touch with him to let him know if Rx was approved or not.

## 2023-04-25 NOTE — Telephone Encounter (Signed)
Patient checking on the status of PA, patient stated he would like to get this medication as soon as possible. Please follow up with patient as soon possible.

## 2023-04-26 NOTE — Telephone Encounter (Signed)
Call placed to patient unable to reach message left on VM. Call to aware patient the refill was sent to pharmacy

## 2023-04-26 NOTE — Telephone Encounter (Signed)
Noted  

## 2023-04-28 ENCOUNTER — Encounter (HOSPITAL_COMMUNITY): Payer: Self-pay

## 2023-04-28 ENCOUNTER — Emergency Department (HOSPITAL_COMMUNITY): Payer: No Typology Code available for payment source

## 2023-04-28 ENCOUNTER — Other Ambulatory Visit: Payer: Self-pay

## 2023-04-28 ENCOUNTER — Inpatient Hospital Stay (HOSPITAL_COMMUNITY)
Admission: EM | Admit: 2023-04-28 | Discharge: 2023-05-10 | DRG: 189 | Disposition: A | Discharge status: Home / Self Care | Payer: No Typology Code available for payment source | Attending: Internal Medicine | Admitting: Internal Medicine

## 2023-04-28 DIAGNOSIS — Z87891 Personal history of nicotine dependence: Secondary | ICD-10-CM | POA: Diagnosis not present

## 2023-04-28 DIAGNOSIS — J9622 Acute and chronic respiratory failure with hypercapnia: Secondary | ICD-10-CM | POA: Diagnosis present

## 2023-04-28 DIAGNOSIS — E876 Hypokalemia: Secondary | ICD-10-CM | POA: Diagnosis present

## 2023-04-28 DIAGNOSIS — E8809 Other disorders of plasma-protein metabolism, not elsewhere classified: Secondary | ICD-10-CM | POA: Diagnosis present

## 2023-04-28 DIAGNOSIS — Z7982 Long term (current) use of aspirin: Secondary | ICD-10-CM

## 2023-04-28 DIAGNOSIS — J9621 Acute and chronic respiratory failure with hypoxia: Principal | ICD-10-CM | POA: Diagnosis present

## 2023-04-28 DIAGNOSIS — I2723 Pulmonary hypertension due to lung diseases and hypoxia: Secondary | ICD-10-CM | POA: Diagnosis present

## 2023-04-28 DIAGNOSIS — D696 Thrombocytopenia, unspecified: Secondary | ICD-10-CM | POA: Diagnosis present

## 2023-04-28 DIAGNOSIS — J9611 Chronic respiratory failure with hypoxia: Secondary | ICD-10-CM | POA: Diagnosis present

## 2023-04-28 DIAGNOSIS — Z8249 Family history of ischemic heart disease and other diseases of the circulatory system: Secondary | ICD-10-CM | POA: Diagnosis not present

## 2023-04-28 DIAGNOSIS — D539 Nutritional anemia, unspecified: Secondary | ICD-10-CM | POA: Diagnosis present

## 2023-04-28 DIAGNOSIS — Z681 Body mass index (BMI) 19 or less, adult: Secondary | ICD-10-CM

## 2023-04-28 DIAGNOSIS — F419 Anxiety disorder, unspecified: Secondary | ICD-10-CM | POA: Diagnosis present

## 2023-04-28 DIAGNOSIS — I272 Pulmonary hypertension, unspecified: Secondary | ICD-10-CM | POA: Diagnosis present

## 2023-04-28 DIAGNOSIS — Z833 Family history of diabetes mellitus: Secondary | ICD-10-CM

## 2023-04-28 DIAGNOSIS — E8729 Other acidosis: Secondary | ICD-10-CM | POA: Diagnosis present

## 2023-04-28 DIAGNOSIS — R000 Tachycardia, unspecified: Secondary | ICD-10-CM | POA: Diagnosis not present

## 2023-04-28 DIAGNOSIS — Z9981 Dependence on supplemental oxygen: Secondary | ICD-10-CM

## 2023-04-28 DIAGNOSIS — J9612 Chronic respiratory failure with hypercapnia: Secondary | ICD-10-CM | POA: Diagnosis not present

## 2023-04-28 DIAGNOSIS — I700 Atherosclerosis of aorta: Secondary | ICD-10-CM | POA: Diagnosis present

## 2023-04-28 DIAGNOSIS — J441 Chronic obstructive pulmonary disease with (acute) exacerbation: Secondary | ICD-10-CM | POA: Diagnosis not present

## 2023-04-28 DIAGNOSIS — E785 Hyperlipidemia, unspecified: Secondary | ICD-10-CM | POA: Diagnosis present

## 2023-04-28 DIAGNOSIS — Z888 Allergy status to other drugs, medicaments and biological substances status: Secondary | ICD-10-CM

## 2023-04-28 DIAGNOSIS — Z1152 Encounter for screening for COVID-19: Secondary | ICD-10-CM | POA: Diagnosis not present

## 2023-04-28 DIAGNOSIS — J449 Chronic obstructive pulmonary disease, unspecified: Secondary | ICD-10-CM | POA: Diagnosis present

## 2023-04-28 DIAGNOSIS — K219 Gastro-esophageal reflux disease without esophagitis: Secondary | ICD-10-CM | POA: Diagnosis present

## 2023-04-28 DIAGNOSIS — Z5901 Sheltered homelessness: Secondary | ICD-10-CM

## 2023-04-28 DIAGNOSIS — Z79899 Other long term (current) drug therapy: Secondary | ICD-10-CM

## 2023-04-28 DIAGNOSIS — Z5900 Homelessness unspecified: Secondary | ICD-10-CM

## 2023-04-28 DIAGNOSIS — R0602 Shortness of breath: Secondary | ICD-10-CM | POA: Diagnosis not present

## 2023-04-28 DIAGNOSIS — E43 Unspecified severe protein-calorie malnutrition: Secondary | ICD-10-CM | POA: Diagnosis present

## 2023-04-28 DIAGNOSIS — I5032 Chronic diastolic (congestive) heart failure: Secondary | ICD-10-CM | POA: Diagnosis present

## 2023-04-28 LAB — CBC WITH DIFFERENTIAL/PLATELET
Abs Immature Granulocytes: 0.02 10*3/uL (ref 0.00–0.07)
Basophils Absolute: 0 10*3/uL (ref 0.0–0.1)
Basophils Relative: 0 %
Eosinophils Absolute: 0.1 10*3/uL (ref 0.0–0.5)
Eosinophils Relative: 2 %
HCT: 40.7 % (ref 39.0–52.0)
Hemoglobin: 12 g/dL — ABNORMAL LOW (ref 13.0–17.0)
Immature Granulocytes: 0 %
Lymphocytes Relative: 7 %
Lymphs Abs: 0.5 10*3/uL — ABNORMAL LOW (ref 0.7–4.0)
MCH: 31.3 pg (ref 26.0–34.0)
MCHC: 29.5 g/dL — ABNORMAL LOW (ref 30.0–36.0)
MCV: 106.3 fL — ABNORMAL HIGH (ref 80.0–100.0)
Monocytes Absolute: 0.5 10*3/uL (ref 0.1–1.0)
Monocytes Relative: 7 %
Neutro Abs: 6.2 10*3/uL (ref 1.7–7.7)
Neutrophils Relative %: 84 %
Platelets: 159 10*3/uL (ref 150–400)
RBC: 3.83 MIL/uL — ABNORMAL LOW (ref 4.22–5.81)
RDW: 12.6 % (ref 11.5–15.5)
WBC: 7.4 10*3/uL (ref 4.0–10.5)
nRBC: 0 % (ref 0.0–0.2)

## 2023-04-28 LAB — MRSA NEXT GEN BY PCR, NASAL: MRSA by PCR Next Gen: NOT DETECTED

## 2023-04-28 LAB — BRAIN NATRIURETIC PEPTIDE: B Natriuretic Peptide: 12.8 pg/mL (ref 0.0–100.0)

## 2023-04-28 LAB — HEPATIC FUNCTION PANEL
ALT: 19 U/L (ref 0–44)
AST: 18 U/L (ref 15–41)
Albumin: 3.6 g/dL (ref 3.5–5.0)
Alkaline Phosphatase: 48 U/L (ref 38–126)
Bilirubin, Direct: 0.1 mg/dL (ref 0.0–0.2)
Indirect Bilirubin: 0.6 mg/dL (ref 0.3–0.9)
Total Bilirubin: 0.7 mg/dL (ref 0.3–1.2)
Total Protein: 7.1 g/dL (ref 6.5–8.1)

## 2023-04-28 LAB — I-STAT CHEM 8, ED
BUN: 21 mg/dL (ref 8–23)
Calcium, Ion: 1.1 mmol/L — ABNORMAL LOW (ref 1.15–1.40)
Chloride: 88 mmol/L — ABNORMAL LOW (ref 98–111)
Creatinine, Ser: 0.8 mg/dL (ref 0.61–1.24)
Glucose, Bld: 144 mg/dL — ABNORMAL HIGH (ref 70–99)
HCT: 40 % (ref 39.0–52.0)
Hemoglobin: 13.6 g/dL (ref 13.0–17.0)
Potassium: 3.6 mmol/L (ref 3.5–5.1)
Sodium: 140 mmol/L (ref 135–145)
TCO2: 46 mmol/L — ABNORMAL HIGH (ref 22–32)

## 2023-04-28 LAB — BASIC METABOLIC PANEL
Anion gap: 9 (ref 5–15)
BUN: 16 mg/dL (ref 8–23)
CO2: 41 mmol/L — ABNORMAL HIGH (ref 22–32)
Calcium: 8.9 mg/dL (ref 8.9–10.3)
Chloride: 91 mmol/L — ABNORMAL LOW (ref 98–111)
Creatinine, Ser: 0.49 mg/dL — ABNORMAL LOW (ref 0.61–1.24)
GFR, Estimated: >60 mL/min (ref >60)
Glucose, Bld: 144 mg/dL — ABNORMAL HIGH (ref 70–99)
Potassium: 3.1 mmol/L — ABNORMAL LOW (ref 3.5–5.1)
Sodium: 141 mmol/L (ref 135–145)

## 2023-04-28 LAB — BLOOD GAS, VENOUS
Acid-Base Excess: 16.9 mmol/L — ABNORMAL HIGH (ref 0.0–2.0)
Bicarbonate: 48.9 mmol/L — ABNORMAL HIGH (ref 20.0–28.0)
O2 Saturation: 41.7 %
Patient temperature: 37
pCO2, Ven: 104 mm[Hg] (ref 44–60)
pH, Ven: 7.28 (ref 7.25–7.43)
pO2, Ven: <31 mm[Hg] — CL (ref 32–45)

## 2023-04-28 LAB — RESP PANEL BY RT-PCR (RSV, FLU A&B, COVID)  RVPGX2
Influenza A by PCR: NEGATIVE
Influenza B by PCR: NEGATIVE
Resp Syncytial Virus by PCR: NEGATIVE
SARS Coronavirus 2 by RT PCR: NEGATIVE

## 2023-04-28 LAB — MAGNESIUM: Magnesium: 1.8 mg/dL (ref 1.7–2.4)

## 2023-04-28 LAB — TROPONIN I (HIGH SENSITIVITY)
Troponin I (High Sensitivity): 4 ng/L (ref <18)
Troponin I (High Sensitivity): 5 ng/L (ref <18)

## 2023-04-28 MED ORDER — ONDANSETRON HCL 4 MG/2ML IJ SOLN: 4.0000 mg | Freq: Four times a day (QID) | INTRAMUSCULAR | Status: DC | PRN

## 2023-04-28 MED ORDER — ORAL CARE MOUTH RINSE
15.0000 mL | OROMUCOSAL | Status: DC
  Administered 2023-04-28 – 2023-05-01 (×8): 15 mL via OROMUCOSAL

## 2023-04-28 MED ORDER — POTASSIUM CHLORIDE CRYS ER 20 MEQ PO TBCR
40.0000 meq | EXTENDED_RELEASE_TABLET | Freq: Once | ORAL | Status: AC
  Administered 2023-04-28: 40 meq via ORAL
  Filled 2023-04-28: qty 2

## 2023-04-28 MED ORDER — TRAZODONE HCL 50 MG PO TABS
25.0000 mg | ORAL_TABLET | Freq: Every evening | ORAL | Status: DC | PRN
  Administered 2023-04-29 – 2023-05-08 (×4): 25 mg via ORAL
  Filled 2023-04-28 (×6): qty 1

## 2023-04-28 MED ORDER — FUROSEMIDE 10 MG/ML IJ SOLN
20.0000 mg | Freq: Once | INTRAMUSCULAR | Status: AC
  Administered 2023-04-28: 20 mg via INTRAVENOUS
  Filled 2023-04-28: qty 4

## 2023-04-28 MED ORDER — ENSURE ENLIVE PO LIQD
237.0000 mL | Freq: Two times a day (BID) | ORAL | Status: DC
  Administered 2023-04-29 – 2023-05-10 (×12): 237 mL via ORAL

## 2023-04-28 MED ORDER — IPRATROPIUM-ALBUTEROL 0.5-2.5 (3) MG/3ML IN SOLN
3.0000 mL | Freq: Once | RESPIRATORY_TRACT | Status: AC
Start: 2023-04-28 — End: 2023-04-28
  Administered 2023-04-28: 3 mL via RESPIRATORY_TRACT
  Filled 2023-04-28: qty 3

## 2023-04-28 MED ORDER — IPRATROPIUM-ALBUTEROL 0.5-2.5 (3) MG/3ML IN SOLN
3.0000 mL | Freq: Four times a day (QID) | RESPIRATORY_TRACT | Status: DC
  Administered 2023-04-28 – 2023-04-30 (×7): 3 mL via RESPIRATORY_TRACT
  Filled 2023-04-28 (×7): qty 3

## 2023-04-28 MED ORDER — FAMOTIDINE 20 MG PO TABS: 20.0000 mg | ORAL_TABLET | Freq: Two times a day (BID) | ORAL | Status: DC

## 2023-04-28 MED ORDER — ACETAMINOPHEN 325 MG PO TABS: 650.0000 mg | ORAL_TABLET | Freq: Four times a day (QID) | ORAL | Status: DC | PRN

## 2023-04-28 MED ORDER — ASPIRIN 81 MG PO TBEC
81.0000 mg | DELAYED_RELEASE_TABLET | Freq: Every day | ORAL | Status: DC
  Administered 2023-04-29 – 2023-05-10 (×12): 81 mg via ORAL
  Filled 2023-04-28 (×12): qty 1

## 2023-04-28 MED ORDER — ALBUTEROL SULFATE (2.5 MG/3ML) 0.083% IN NEBU
2.5000 mg | INHALATION_SOLUTION | RESPIRATORY_TRACT | Status: DC | PRN
  Administered 2023-04-30: 2.5 mg via RESPIRATORY_TRACT
  Filled 2023-04-28: qty 3

## 2023-04-28 MED ORDER — ACETAMINOPHEN 650 MG RE SUPP: 650.0000 mg | Freq: Four times a day (QID) | RECTAL | Status: DC | PRN

## 2023-04-28 MED ORDER — ORAL CARE MOUTH RINSE: 15.0000 mL | OROMUCOSAL | Status: DC | PRN

## 2023-04-28 MED ORDER — CHLORHEXIDINE GLUCONATE CLOTH 2 % EX PADS
6.0000 | MEDICATED_PAD | Freq: Every day | CUTANEOUS | Status: DC
  Administered 2023-04-28 – 2023-04-30 (×3): 6 via TOPICAL

## 2023-04-28 MED ORDER — ONDANSETRON HCL 4 MG PO TABS: 4.0000 mg | ORAL_TABLET | Freq: Four times a day (QID) | ORAL | Status: DC | PRN

## 2023-04-28 MED ORDER — IPRATROPIUM-ALBUTEROL 0.5-2.5 (3) MG/3ML IN SOLN
3.0000 mL | Freq: Four times a day (QID) | RESPIRATORY_TRACT | Status: DC
Start: 2023-04-28 — End: 2023-04-28

## 2023-04-28 MED ORDER — SODIUM CHLORIDE 0.9 % IV SOLN
1.0000 g | Freq: Once | INTRAVENOUS | Status: AC
  Administered 2023-04-28: 1 g via INTRAVENOUS
  Filled 2023-04-28: qty 10

## 2023-04-28 MED ORDER — FAMOTIDINE IN NACL 20-0.9 MG/50ML-% IV SOLN
20.0000 mg | Freq: Once | INTRAVENOUS | Status: AC
  Administered 2023-04-28: 20 mg via INTRAVENOUS
  Filled 2023-04-28: qty 50

## 2023-04-28 MED ORDER — ENOXAPARIN SODIUM 40 MG/0.4ML IJ SOSY
40.0000 mg | PREFILLED_SYRINGE | INTRAMUSCULAR | Status: DC
  Administered 2023-04-28 – 2023-05-09 (×12): 40 mg via SUBCUTANEOUS
  Filled 2023-04-28 (×12): qty 0.4

## 2023-04-28 MED ORDER — METHYLPREDNISOLONE SODIUM SUCC 125 MG IJ SOLR
125.0000 mg | Freq: Once | INTRAMUSCULAR | Status: AC
  Administered 2023-04-28: 125 mg via INTRAVENOUS
  Filled 2023-04-28: qty 2

## 2023-04-28 MED ORDER — FAMOTIDINE 20 MG PO TABS
20.0000 mg | ORAL_TABLET | Freq: Two times a day (BID) | ORAL | Status: DC
  Administered 2023-04-29 – 2023-05-10 (×12): 20 mg via ORAL
  Filled 2023-04-28 (×21): qty 1

## 2023-04-28 MED ORDER — ALBUTEROL SULFATE (2.5 MG/3ML) 0.083% IN NEBU
10.0000 mg | INHALATION_SOLUTION | Freq: Once | RESPIRATORY_TRACT | Status: AC
  Administered 2023-04-28: 10 mg via RESPIRATORY_TRACT
  Filled 2023-04-28: qty 12

## 2023-04-28 MED ORDER — ROSUVASTATIN CALCIUM 20 MG PO TABS
40.0000 mg | ORAL_TABLET | Freq: Every day | ORAL | Status: DC
  Administered 2023-04-29 – 2023-05-10 (×12): 40 mg via ORAL
  Filled 2023-04-28 (×12): qty 2

## 2023-04-28 MED ORDER — DOXYCYCLINE HYCLATE 100 MG PO TABS
100.0000 mg | ORAL_TABLET | Freq: Once | ORAL | Status: AC
Start: 2023-04-28 — End: 2023-04-28
  Administered 2023-04-28: 100 mg via ORAL
  Filled 2023-04-28: qty 1

## 2023-04-28 NOTE — H&P (Signed)
History and Physical  Anthony Nelson ZOX:[id number] DOB: 08-01-1960 DOA: 04/28/2023  PCP: Storm Frisk, MD   Chief Complaint: Shortness of breath  HPI: Anthony Nelson is a 62 y.o. male with medical history significant for COPD chronically on 2 L nasal cannula oxygen chronic diastolic congestive heart failure, GERD, pulmonary hypertension who is being admitted to the hospital with recurrent acute exacerbation of COPD.  He was recently admitted to [facility] long hospital 10/8 to 10/14 due to acute exacerbation of COPD.  He was discharged back to his halfway house on his baseline 2 L nasal cannula oxygen.  Tells me that he feels like he got back to baseline after discharge from the hospital, did well for several days.  Starting yesterday, he had increased nonproductive cough, shortness of breath at rest as well as with ambulation.  States that he felt like he had some phlegm stuck in the middle of his chest that he could not cough up.  No fevers, no chest pain.  No nausea or vomiting, abdominal pain or any other complaints.  ED Course: In the ER he has been afebrile, with some tachycardia.  Noticed to be saturating 89% on 3 L nasal cannula oxygen.  VBG showed evidence of respiratory acidosis and hypercarbia.  He was placed on BiPAP.  He was given breathing treatments, IV Solu-Medrol.  He feels like he is currently breathing a little bit better.  Lab work was also done, CBC and CMP unremarkable.  Respiratory panel negative, chest x-ray without acute findings.  Review of Systems: Please see HPI for pertinent positives and negatives. A complete 10 system review of systems are otherwise negative.  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 His smoking use included cigarettes. He started smoking about 46 years ago. He has a 4.4 pack-year smoking history. He has never used smokeless tobacco. He reports that he does not currently use alcohol. He reports that he does not use drugs.   Allergies  Allergen Reactions   Erythromycin Other (See Comments)    Allergy from childhood- does not take it    Family History  Problem Relation Age of Onset   Cancer Mother    Cancer Father    CAD Father    Heart failure Father    Diabetes Maternal Grandmother    Kidney disease Maternal Grandmother    Kidney cancer Paternal Grandmother    Heart attack Paternal Grandfather      Prior to Admission medications   Medication Sig Start Date End Date Taking? Authorizing Provider  albuterol (PROVENTIL) (2.5 MG/3ML) 0.083% nebulizer solution USE 3 MLS BY NEBULIZATION EVERY 6 HOURS AS NEEDED FOR WHEEZING OR SHORTNESS OF BREATH. 06/05/22 06/05/23  Storm Frisk, MD  albuterol (VENTOLIN HFA) 108 (90 Base) MCG/ACT inhaler Inhale 2 puffs into the lungs every 6 hours as needed for wheezing or shortness of breath. 04/02/23   Storm Frisk, MD  Ascorbic Acid (VITAMIN C) 100 MG tablet Take 1 tablet (  100 mg total) by mouth daily. 09/28/21   Storm Frisk, MD  aspirin EC 81 MG tablet Take 1 tablet (81 mg total) by mouth daily. Swallow whole. 09/28/21   Storm Frisk, MD  Cholecalciferol (VITAMIN D3) 50 MCG (2000 UT) TABS Take 2,000 Units by mouth daily. 09/28/21   Storm Frisk, MD  Cyanocobalamin (B-12 PO) Take 1 tablet by mouth daily.    [provider]  dextromethorphan-guaiFENesin (MUCINEX DM) 30-600 MG 12hr tablet Take 1 tablet by mouth 2 (two) times daily as needed for cough. 09/28/21   Storm Frisk, MD  docusate  sodium (COLACE) 100 MG capsule Take 1 capsule (100 mg total) by mouth daily. 09/28/21   Storm Frisk, MD  famotidine (PEPCID) 20 MG tablet TAKE 1 TABLET (20 MG TOTAL) BY MOUTH 2 (TWO) TIMES DAILY. Patient taking differently: Take 20 mg by mouth 2 (two) times daily as needed for heartburn or indigestion. 06/05/22 06/05/23  Storm Frisk, MD  furosemide (LASIX) 20 MG tablet Take 1 tablet (20 mg total) by mouth daily as needed. Patient taking differently: Take 20 mg by mouth daily as needed for fluid. 09/28/21 04/09/23  Storm Frisk, MD  Multiple Vitamins-Minerals (ZINC PO) Take 1 tablet by mouth daily.    [provider]  OXYGEN Inhale 2 L/min into the lungs continuous.     [provider]  potassium chloride SA (KLOR-CON M) 20 MEQ tablet Take 1 tablet (20 mEq total) by mouth daily as needed. Patient taking differently: Take 20 mEq by mouth daily as needed (low potassium). 09/28/21 04/09/23  Storm Frisk, MD  rosuvastatin (CRESTOR) 40 MG tablet Take 1 tablet (40 mg) by mouth daily. 06/05/22 06/05/23  Storm Frisk, MD  sodium chloride (OCEAN) 0.65 % SOLN nasal spray Place 2 sprays into both nostrils in the morning, at noon, and at bedtime. Patient taking differently: Place 2 sprays into both nostrils 3 (three) times daily as needed for congestion. 12/05/20   Storm Frisk, MD  umeclidinium bromide (INCRUSE ELLIPTA) 62.5 MCG/ACT AEPB Inhale 1 puff into the lungs daily. 04/24/23   Storm Frisk, MD    Physical Exam: BP 125/72   Pulse (!) 110   Temp 98 F (36.7 C) (Oral)   Resp (!) 28   Ht 6' (1.829 m)   Wt 59 kg   SpO2 (!) 89%   BMI 17.64 kg/m   General:  Alert, oriented, calm, in no acute distress, sitting up in bed wearing BiPAP.  Pretty conversant.  No cough.  Patient is thin, appears emaciated. Eyes: EOMI, clear conjuctivae, white sclerea Neck: supple, no masses, trachea mildline  Cardiovascular: RRR, no murmurs or rubs, no peripheral edema   Respiratory: Breath sounds are diminished globally, no wheezing or rhonchi, very little air movement.  No tachypnea, retractions stridor or other evidence of respiratory distress Abdomen: soft, nontender, nondistended, normal bowel tones heard  Skin: dry, no rashes  Musculoskeletal: no joint effusions, normal range of motion  Psychiatric: appropriate affect, normal speech  Neurologic: extraocular muscles intact, clear speech, moving all extremities with intact sensorium         Labs on Admission:  Basic Metabolic Panel: Recent Labs  Lab 04/28/23 1505 04/28/23 1506  NA 141 140  K 3.1* 3.6  CL 91* 88*  CO2 41*  --   GLUCOSE 144* 144*  BUN 16 21  CREATININE 0.49* 0.80  CALCIUM 8.9  --   MG 1.8  --  Liver Function Tests: Recent Labs  Lab 04/28/23 1505  AST 18  ALT 19  ALKPHOS 48  BILITOT 0.7  PROT 7.1  ALBUMIN 3.6   No results for input(s): "LIPASE", "AMYLASE" in the last 168 hours. No results for input(s): "AMMONIA" in the last 168 hours. CBC: Recent Labs  Lab 04/28/23 1505 04/28/23 1506  WBC 7.4  --   NEUTROABS 6.2  --   HGB 12.0* 13.6  HCT 40.7 40.0  MCV 106.3*  --   PLT 159  --    Cardiac Enzymes: No results for input(s): "CKTOTAL", "CKMB", "CKMBINDEX", "TROPONINI" in the last 168 hours.  BNP (last 3 results) Recent Labs    04/28/23 1447  BNP 12.8    ProBNP (last 3 results) No results for input(s): "PROBNP" in the last 8760 hours.  CBG: No results for input(s): "GLUCAP" in the last 168 hours.  Radiological Exams on Admission: DG Chest Port 1 View  Result Date: 04/28/2023 CLINICAL DATA:  dyspnea EXAM: PORTABLE CHEST 1 VIEW COMPARISON:  April 09, 2023 FINDINGS: The cardiomediastinal silhouette is unchanged in contour.Emphysematous changes. No pleural effusion. No pneumothorax. No acute pleuroparenchymal abnormality. IMPRESSION: No acute cardiopulmonary abnormality. Given underlying emphysema, recommend evaluation for candidacy for annual lung  cancer screening CT. Electronically Signed   By: Meda Klinefelter M.D.   On: 04/28/2023 16:25    Assessment/Plan Anthony Nelson is a 62 y.o. male with medical history significant for COPD chronically on 2 L nasal cannula oxygen chronic diastolic congestive heart failure, GERD, pulmonary hypertension who is being admitted to the hospital with recurrent acute exacerbation of COPD.    Acute exacerbation of COPD-with acute on chronic hypoxia and hypercarbia.  Stable and improving with treatment in the emergency department on BiPAP. -Inpatient admission to stepdown -Continue BiPAP, wean as tolerated -IV Solu-Medrol twice daily -Scheduled DuoNebs, as needed albuterol -Incentive spirometer, and flutter valve  Chronic diastolic congestive heart failure-no evidence of acute heart failure, note last. TTE 04/18/2022 with LVEF 50-55%, LV with no regional wall trauma maladies, RV mildly enlarged, trivial MR.  Will continue cardiac medications, including Lasix p.o.  GERD-famotidine twice daily  Hyperlipidemia-Crestor  DVT prophylaxis: Lovenox     Code Status: Full Code  Consults called: None  Admission status: The appropriate patient status for this patient is INPATIENT. Inpatient status is judged to be reasonable and necessary in order to provide the required intensity of service to ensure the patient's safety. The patient's presenting symptoms, physical exam findings, and initial radiographic and laboratory data in the context of their chronic comorbidities is felt to place them at high risk for further clinical deterioration. Furthermore, it is not anticipated that the patient will be medically stable for discharge from the hospital within 2 midnights of admission.    I certify that at the point of admission it is my clinical judgment that the patient will require inpatient hospital care spanning beyond 2 midnights from the point of admission due to high intensity of service, high risk for further  deterioration and high frequency of surveillance required  Time spent: 59 minutes  Skylan Gift Sharlette Dense MD [facility] Pager [phone]  If 7PM-7AM, please contact night-coverage [email or web address] Password [facility]  04/28/2023, 5:17 PM

## 2023-04-28 NOTE — ED Provider Notes (Signed)
[place] [facility]ERGENC[facility] DEPARTMENT AT [facility] Provider Note   CSN: [id number] Arrival date & time: 04/28/23  1432     History  Chief Complaint  Patient presents with   Shortness of Breath    Anthony Nelson is a 62 y.o. male.  HPI Patient presents for shortness of breath.  Medical history includes COPD, pulmonary hypertension, CHF, HLD, GERD.  He was hospitalized 2 weeks ago for COPD exacerbation.  He required BiPAP at the time.  He completed a 5-day course of steroids and 7 days of doxycycline.  He is prescribed 20 mg Lasix as needed.  He has not been taking any of this since he left the hospital.  He does wear 2 L of supplemental oxygen throughout the day at baseline.  Over the past several days, he has had intermittent worsening shortness of breath.  He seems to notice more when he gets up to do anything.  He has recent severe exercise intolerance.  He did a breathing treatment this morning.  He arrives via EMS.  EMS did not provide any interventions.  He has had a productive cough, more in the mornings.  Sputum has been greenish and brown-colored.    Home Medications Prior to Admission medications   Medication Sig Start Date End Date Taking? Authorizing Provider  albuterol (PROVENTIL) (2.5 MG/3ML) 0.083% nebulizer solution USE 3 MLS BY NEBULIZATION EVERY 6 HOURS AS NEEDED FOR WHEEZING OR SHORTNESS OF BREATH. 06/05/22 06/05/23  Storm Frisk, MD  albuterol (VENTOLIN HFA) 108 (90 Base) MCG/ACT inhaler Inhale 2 puffs into the lungs every 6 hours as needed for wheezing or shortness of breath. 04/02/23   Storm Frisk, MD  Ascorbic Acid (VITAMIN C) 100 MG tablet Take 1 tablet (100 mg total) by mouth daily. 09/28/21   Storm Frisk, MD  aspirin EC 81 MG tablet Take 1 tablet (81 mg total) by mouth daily. Swallow whole. 09/28/21   Storm Frisk, MD  Cholecalciferol (VITAMIN D3) 50 MCG (2000 UT) TABS Take 2,000 Units by mouth daily. 09/28/21   Storm Frisk, MD   Cyanocobalamin (B-12 PO) Take 1 tablet by mouth daily.    [provider]  dextromethorphan-guaiFENesin (MUCINEX DM) 30-600 MG 12hr tablet Take 1 tablet by mouth 2 (two) times daily as needed for cough. 09/28/21   Storm Frisk, MD  docusate sodium (COLACE) 100 MG capsule Take 1 capsule (100 mg total) by mouth daily. 09/28/21   Storm Frisk, MD  famotidine (PEPCID) 20 MG tablet TAKE 1 TABLET (20 MG TOTAL) BY MOUTH 2 (TWO) TIMES DAILY. Patient taking differently: Take 20 mg by mouth 2 (two) times daily as needed for heartburn or indigestion. 06/05/22 06/05/23  Storm Frisk, MD  furosemide (LASIX) 20 MG tablet Take 1 tablet (20 mg total) by mouth daily as needed. Patient taking differently: Take 20 mg by mouth daily as needed for fluid. 09/28/21 04/09/23  Storm Frisk, MD  Multiple Vitamins-Minerals (ZINC PO) Take 1 tablet by mouth daily.    [provider]  OXYGEN Inhale 2 L/min into the lungs continuous.     [provider]  potassium chloride SA (KLOR-CON M) 20 MEQ tablet Take 1 tablet (20 mEq total) by mouth daily as needed. Patient taking differently: Take 20 mEq by mouth daily as needed (low potassium). 09/28/21 04/09/23  Storm Frisk, MD  rosuvastatin (CRESTOR) 40 MG tablet Take 1 tablet (40 mg) by mouth daily. 06/05/22 06/05/23  Shan Levans  E, MD  sodium chloride (OCEAN) 0.65 % SOLN nasal spray Place 2 sprays into both nostrils in the morning, at noon, and at bedtime. Patient taking differently: Place 2 sprays into both nostrils 3 (three) times daily as needed for congestion. 12/05/20   Storm Frisk, MD  umeclidinium bromide (INCRUSE ELLIPTA) 62.5 MCG/ACT AEPB Inhale 1 puff into the lungs daily. 04/24/23   Storm Frisk, MD      Allergies    Erythromycin    Review of Systems   Review of Systems  Constitutional:  Positive for fatigue.  Respiratory:  Positive for cough, chest tightness and shortness of breath.   All other systems  reviewed and are negative.   Physical Exam Updated Vital Signs BP 125/72   Pulse (!) 110   Temp 98 F (36.7 C) (Oral)   Resp (!) 28   Ht 6' (1.829 m)   Wt 59 kg   SpO2 (!) 89%   BMI 17.64 kg/m  Physical Exam Vitals and nursing note reviewed.  Constitutional:      General: He is not in acute distress.    Appearance: He is well-developed. He is ill-appearing. He is not toxic-appearing or diaphoretic.  HENT:     Head: Normocephalic and atraumatic.     Right Ear: External ear normal.     Left Ear: External ear normal.     Nose: Nose normal.     Mouth/Throat:     Mouth: Mucous membranes are moist.  Eyes:     Extraocular Movements: Extraocular movements intact.     Conjunctiva/sclera: Conjunctivae normal.  Cardiovascular:     Rate and Rhythm: Regular rhythm. Tachycardia present.     Heart sounds: No murmur heard. Pulmonary:     Effort: Accessory muscle usage and retractions present. No respiratory distress.     Breath sounds: Decreased air movement present. Decreased breath sounds present.  Abdominal:     General: There is no distension.     Palpations: Abdomen is soft.     Tenderness: There is no abdominal tenderness.  Musculoskeletal:        General: No swelling. Normal range of motion.     Cervical back: Normal range of motion and neck supple.     Right lower leg: Edema present.     Left lower leg: Edema present.  Skin:    General: Skin is warm and dry.     Coloration: Skin is not jaundiced or pale.  Neurological:     General: No focal deficit present.     Mental Status: He is alert and oriented to person, place, and time.  Psychiatric:        Mood and Affect: Mood normal.        Behavior: Behavior normal.     ED Results / Procedures / Treatments   Labs (all labs ordered are listed, but only abnormal results are displayed) Labs Reviewed  BASIC METABOLIC PANEL - Abnormal; Notable for the following components:      Result Value   Potassium 3.1 (*)     Chloride 91 (*)    CO2 41 (*)    Glucose, Bld 144 (*)    Creatinine, Ser 0.49 (*)    All other components within normal limits  BLOOD GAS, VENOUS - Abnormal; Notable for the following components:   pCO2, Ven 104 (*)    pO2, Ven <31 (*)    Bicarbonate 48.9 (*)    Acid-Base Excess 16.9 (*)    All other  components within normal limits  CBC WITH DIFFERENTIAL/PLATELET - Abnormal; Notable for the following components:   RBC 3.83 (*)    Hemoglobin 12.0 (*)    MCV 106.3 (*)    MCHC 29.5 (*)    Lymphs Abs 0.5 (*)    All other components within normal limits  I-STAT CHEM 8, ED - Abnormal; Notable for the following components:   Chloride 88 (*)    Glucose, Bld 144 (*)    Calcium, Ion 1.10 (*)    TCO2 46 (*)    All other components within normal limits  RESP PANEL BY RT-PCR (RSV, FLU A&B, COVID)  [id number]  HEPATIC FUNCTION PANEL  MAGNESIUM  BRAIN NATRIURETIC PEPTIDE  TROPONIN I (HIGH SENSITIVITY)  TROPONIN I (HIGH SENSITIVITY)    EKG EKG Interpretation Date/Time:  Sunday April 28 2023 15:11:03 EDT Ventricular Rate:  96 PR Interval:  141 QRS Duration:  100 QT Interval:  362 QTC Calculation: 458 R Axis:   118  Text Interpretation: Sinus rhythm Right atrial enlargement Left posterior fascicular block Abnormal R-wave progression, late transition Nonspecific T abnrm, anterolateral leads Confirmed by Gloris Manchester [phone]) on 04/28/2023 3:18:01 PM  Radiology DG Chest Port 1 View  Result Date: 04/28/2023 CLINICAL DATA:  dyspnea EXAM: PORTABLE CHEST 1 VIEW COMPARISON:  April 09, 2023 FINDINGS: The cardiomediastinal silhouette is unchanged in contour.Emphysematous changes. No pleural effusion. No pneumothorax. No acute pleuroparenchymal abnormality. IMPRESSION: No acute cardiopulmonary abnormality. Given underlying emphysema, recommend evaluation for candidacy for annual lung cancer screening CT. Electronically Signed   By: Meda Klinefelter M.D.   On: 04/28/2023 16:25     Procedures Procedures    Medications Ordered in ED Medications  potassium chloride SA (KLOR-CON M) CR tablet 40 mEq (has no administration in time range)  furosemide (LASIX) injection 20 mg (has no administration in time range)  methylPREDNISolone sodium succinate (SOLU-MEDROL) 125 mg/2 mL injection 125 mg (125 mg Intravenous Given 04/28/23 1506)  ipratropium-albuterol (DUONEB) 0.5-2.5 (3) MG/3ML nebulizer solution 3 mL (3 mLs Nebulization Given 04/28/23 1504)  albuterol (PROVENTIL) (2.5 MG/3ML) 0.083% nebulizer solution 10 mg (10 mg Nebulization Given 04/28/23 1504)  cefTRIAXone (ROCEPHIN) 1 g in sodium chloride 0.9 % 100 mL IVPB (0 g Intravenous Stopped 04/28/23 1606)  doxycycline (VIBRA-TABS) tablet 100 mg (100 mg Oral Given 04/28/23 1535)    ED Course/ Medical Decision Making/ A&P                                 Medical Decision Making Amount and/or Complexity of Data Reviewed Labs: ordered. Radiology: ordered.  Risk Prescription drug management.   This patient presents to the ED for concern of shortness of breath, this involves an extensive number of treatment options, and is a complaint that carries with it a high risk of complications and morbidity.  The differential diagnosis includes COPD exacerbation, CHF, pneumonia, URI, PE, anemia, acidosis   Co morbidities that complicate the patient evaluation  COPD, pulmonary hypertension, CHF, HLD, GERD   Additional history obtained:  Additional history obtained from N/A External records from outside source obtained and reviewed including EMR   Lab Tests:  I Ordered, and personally interpreted labs.  The pertinent results include:  Baseline hemoglobin, no leukocytosis, respiratory acidosis on blood gas, hypokalemia and hypochloremia with otherwise normal electrolytes, normal troponin   Imaging Studies ordered:  I ordered imaging studies including chest x-ray I independently visualized and interpreted imaging which  showed  no acute findings I agree with the radiologist interpretation   Cardiac Monitoring: / EKG:  The patient was maintained on a cardiac monitor.  I personally viewed and interpreted the cardiac monitored which showed an underlying rhythm of: Sinus rhythm   Problem List / ED Course / Critical interventions / Medication management  Patient presenting for shortness of breath.  He has chronic COPD with hypoxia.  Shortness of breath has recently been worsened with any type of exertional activity.  On exam, patient is able to speak in complete sentences.  He does have increased work of breathing.  He has severely diminished air movement on lung auscultation.  He has BLE pitting edema.  I suspect multifactorial shortness of breath.  Solu-Medrol and nebulized breathing treatment was ordered.  Workup was initiated.  Blood gas showed a respiratory acidosis.  BiPAP was initiated.  Further lab work showed hypokalemia.  Replacement potassium was ordered.  BMP pending at time of admission.  Given his exercise intolerance and BLE pitting edema, will provide 20 mg of Lasix for now.  Patient was admitted for further management. I ordered medication including DuoNeb, albuterol, Solu-Medrol, ceftriaxone, doxycycline for COPD exacerbation; potassium chloride for hypokalemia; Lasix for diuresis Reevaluation of the patient after these medicines showed that the patient improved I have reviewed the patients home medicines and have made adjustments as needed   Social Determinants of Health:  Lives independently  CRITICAL CARE Performed by: Gloris Manchester   Total critical care time: 34 minutes  Critical care time was exclusive of separately billable procedures and treating other patients.  Critical care was necessary to treat or prevent imminent or life-threatening deterioration.  Critical care was time spent personally by me on the following activities: development of treatment plan with patient and/or  surrogate as well as nursing, discussions with consultants, evaluation of patient's response to treatment, examination of patient, obtaining history from patient or surrogate, ordering and performing treatments and interventions, ordering and review of laboratory studies, ordering and review of radiographic studies, pulse oximetry and re-evaluation of patient's condition.        Final Clinical Impression(s) / ED Diagnoses Final diagnoses:  COPD exacerbation (HCC)  Acute on chronic respiratory failure with hypercapnia [facility])    Rx / DC Orders ED Discharge Orders     None         Gloris Manchester, MD 04/28/23 1642

## 2023-04-28 NOTE — ED Notes (Signed)
ED TO INPATIENT HANDOFF REPORT  Name/Age/Gender Anthony Nelson 62 y.o. male  Code Status    Code Status Orders  (From admission, onward)           Start     Ordered   04/28/23 1706  Full code  Continuous       Question:  By:  Answer:  Consent: discussion documented in EHR   04/28/23 1706           Code Status History     Date Active Date Inactive Code Status Order ID Comments User Context   04/09/2023 1903 04/15/2023 2040 Full Code [id number]  Rometta Emery, MD ED   09/03/2020 0552 09/07/2020 1844 Full Code [id number]  Bobette Mo, MD ED   03/08/2020 1532 03/15/2020 2231 Full Code [id number]  Bevelyn Ngo, NP ED   04/25/2019 0446 04/30/2019 1824 Full Code [id number]  Eduard Clos, MD ED   02/08/2018 1235 02/10/2018 1731 Full Code [id number]  Onnie Boer, MD Inpatient       Home/SNF/Other Home  Chief Complaint COPD with acute exacerbation (HCC) [J44.1]  Level of Care/Admitting Diagnosis ED Disposition     ED Disposition  Admit   Condition  --   Comment  Hospital Area: [facility] [[id number]]  Level of Care: Stepdown [14]  Admit to SDU based on following criteria: Respiratory Distress:  Frequent assessment and/or intervention to maintain adequate ventilation/respiration, pulmonary toilet, and respiratory treatment.  May admit patient to [facility] or [facility] if equivalent level of care is available:: Yes  Covid Evaluation: Confirmed COVID Negative  Diagnosis: COPD with acute exacerbation [facility]) [[id number]]  Admitting Physician: Maryln Gottron [[id number]]  Attending Physician: Kirby Crigler, MIR [email or web address] [[id number]]  Certification:: I certify this patient will need inpatient services for at least 2 midnights  Expected Medical Readiness: 05/01/2023          Medical History Past Medical History:  Diagnosis Date   Atherosclerosis of aorta (HCC) 04/06/2020   Bronchitis    COPD (chronic obstructive pulmonary disease) (HCC)     Diastolic heart failure (HCC)    GERD (gastroesophageal reflux disease)    Pulmonary hypertension (HCC)    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   Respiratory failure with hypoxia (HCC)     Allergies Allergies  Allergen Reactions   Erythromycin Other (See Comments)    Allergy from childhood- does not take it    IV Location/Drains/Wounds Patient Lines/Drains/Airways Status     Active Line/Drains/Airways     Name Placement date Placement time Site Days   Peripheral IV 04/28/23 20 G 1" Anterior;Proximal;Right Forearm 04/28/23  1500  Forearm  less than 1            Labs/Imaging Results for orders placed or performed during the hospital encounter of 04/28/23 (from the past 48 hour(s))  Brain natriuretic peptide     Status: None   Collection Time: 04/28/23  2:47 PM  Result Value Ref Range   B Natriuretic Peptide 12.8 0.0 - 100.0 pg/mL    Comment: Performed at [facility], [address] [place], [place] [address]  Resp panel by RT-PCR (RSV, Flu A&B, Covid) Anterior Nasal Swab     Status: None   Collection Time: 04/28/23  3:05 PM   Specimen: Anterior Nasal Swab  Result Value Ref Range   SARS Coronavirus 2 by RT PCR NEGATIVE NEGATIVE    Comment: (NOTE) SARS-CoV-2 target nucleic acids  are NOT DETECTED.  The SARS-CoV-2 RNA is generally detectable in upper respiratory specimens during the acute phase of infection. The lowest concentration of SARS-CoV-2 viral copies this assay can detect is 138 copies/mL. A negative result does not preclude SARS-Cov-2 infection and should not be used as the sole basis for treatment or other patient management decisions. A negative result may occur with  improper specimen collection/handling, submission of specimen other than nasopharyngeal swab, presence of viral mutation(s) within the areas targeted by this assay, and inadequate number of viral copies(<138 copies/mL). A  negative result must be combined with clinical observations, patient history, and epidemiological information. The expected result is Negative.  Fact Sheet for Patients:  BloggerCourse.com  Fact Sheet for Healthcare Providers:  SeriousBroker.it  This test is no t yet approved or cleared by the [place] FDA and  has been authorized for detection and/or diagnosis of SARS-CoV-2 by FDA under an Emergency Use Authorization (EUA). This EUA will remain  in effect (meaning this test can be used) for the duration of the COVID-19 declaration under Section 564(b)(1) of the Act, 21 U.S.C.section 360bbb-3(b)(1), unless the authorization is terminated  or revoked sooner.       Influenza A by PCR NEGATIVE NEGATIVE   Influenza B by PCR NEGATIVE NEGATIVE    Comment: (NOTE) The Xpert Xpress SARS-CoV-2/FLU/RSV plus assay is intended as an aid in the diagnosis of influenza from Nasopharyngeal swab specimens and should not be used as a sole basis for treatment. Nasal washings and aspirates are unacceptable for Xpert Xpress SARS-CoV-2/FLU/RSV testing.  Fact Sheet for Patients: BloggerCourse.com  Fact Sheet for Healthcare Providers: SeriousBroker.it  This test is not yet approved or cleared by the [place] FDA and has been authorized for detection and/or diagnosis of SARS-CoV-2 by FDA under an Emergency Use Authorization (EUA). This EUA will remain in effect (meaning this test can be used) for the duration of the COVID-19 declaration under Section 564(b)(1) of the Act, 21 U.S.C. section 360bbb-3(b)(1), unless the authorization is terminated or revoked.     Resp Syncytial Virus by PCR NEGATIVE NEGATIVE    Comment: (NOTE) Fact Sheet for Patients: BloggerCourse.com  Fact Sheet for Healthcare Providers: SeriousBroker.it  This test is not  yet approved or cleared by the [place] FDA and has been authorized for detection and/or diagnosis of SARS-CoV-2 by FDA under an Emergency Use Authorization (EUA). This EUA will remain in effect (meaning this test can be used) for the duration of the COVID-19 declaration under Section 564(b)(1) of the Act, 21 U.S.C. section 360bbb-3(b)(1), unless the authorization is terminated or revoked.  Performed at [facility], [address], [place], [place] [address]   Basic metabolic panel     Status: Abnormal   Collection Time: 04/28/23  3:05 PM  Result Value Ref Range   Sodium 141 135 - 145 mmol/L   Potassium 3.1 (L) 3.5 - 5.1 mmol/L   Chloride 91 (L) 98 - 111 mmol/L   CO2 41 (H) 22 - 32 mmol/L   Glucose, Bld 144 (H) 70 - 99 mg/dL    Comment: Glucose reference range applies only to samples taken after fasting for at least 8 hours.   BUN 16 8 - 23 mg/dL   Creatinine, Ser 6.21 (L) 0.61 - 1.24 mg/dL   Calcium 8.9 8.9 - 30.8 mg/dL   GFR, Estimated >65 >78 mL/min    Comment: (NOTE) Calculated using the CKD-EPI Creatinine Equation (2021)    Anion gap 9 5 -  15    Comment: Performed at [facility], [address] [place], [place] [address]  Hepatic function panel     Status: None   Collection Time: 04/28/23  3:05 PM  Result Value Ref Range   Total Protein 7.1 6.5 - 8.1 g/dL   Albumin 3.6 3.5 - 5.0 g/dL   AST 18 15 - 41 U/L   ALT 19 0 - 44 U/L   Alkaline Phosphatase 48 38 - 126 U/L   Total Bilirubin 0.7 0.3 - 1.2 mg/dL   Bilirubin, Direct 0.1 0.0 - 0.2 mg/dL   Indirect Bilirubin 0.6 0.3 - 0.9 mg/dL    Comment: Performed at [facility], [address] [place], [place] [address]  Magnesium     Status: None   Collection Time: 04/28/23  3:05 PM  Result Value Ref Range   Magnesium 1.8 1.7 - 2.4 mg/dL    Comment: Performed at [facility], [address], [place], [place] [address]  Blood gas, venous      Status: Abnormal   Collection Time: 04/28/23  3:05 PM  Result Value Ref Range   pH, Ven 7.28 7.25 - 7.43   pCO2, Ven 104 (HH) 44 - 60 mmHg    Comment: CRITICAL RESULT CALLED TO, READ BACK BY AND VERIFIED WITH: Philipp Callegari,L. RN AT 1529 04/28/23 MULLINS,T    pO2, Ven <31 (LL) 32 - 45 mmHg    Comment: CRITICAL RESULT CALLED TO, READ BACK BY AND VERIFIED WITH: Arjuna Doeden,L. RN AT 1529 04/28/23 MULLINS,T    Bicarbonate 48.9 (H) 20.0 - 28.0 mmol/L   Acid-Base Excess 16.9 (H) 0.0 - 2.0 mmol/L   O2 Saturation 41.7 %   Patient temperature 37.0     Comment: Performed at [facility], [address], [place], [place] [address]  CBC with Differential/Platelet     Status: Abnormal   Collection Time: 04/28/23  3:05 PM  Result Value Ref Range   WBC 7.4 4.0 - 10.5 K/uL   RBC 3.83 (L) 4.22 - 5.81 MIL/uL   Hemoglobin 12.0 (L) 13.0 - 17.0 g/dL   HCT 25.9 56.3 - 87.5 %   MCV 106.3 (H) 80.0 - 100.0 fL   MCH 31.3 26.0 - 34.0 pg   MCHC 29.5 (L) 30.0 - 36.0 g/dL   RDW 64.3 32.9 - 51.8 %   Platelets 159 150 - 400 K/uL   nRBC 0.0 0.0 - 0.2 %   Neutrophils Relative % 84 %   Neutro Abs 6.2 1.7 - 7.7 K/uL   Lymphocytes Relative 7 %   Lymphs Abs 0.5 (L) 0.7 - 4.0 K/uL   Monocytes Relative 7 %   Monocytes Absolute 0.5 0.1 - 1.0 K/uL   Eosinophils Relative 2 %   Eosinophils Absolute 0.1 0.0 - 0.5 K/uL   Basophils Relative 0 %   Basophils Absolute 0.0 0.0 - 0.1 K/uL   Immature Granulocytes 0 %   Abs Immature Granulocytes 0.02 0.00 - 0.07 K/uL    Comment: Performed at [facility], [address] [place], [place] [address]  Troponin I (High Sensitivity)     Status: None   Collection Time: 04/28/23  3:05 PM  Result Value Ref Range   Troponin I (High Sensitivity) 4 <18 ng/L    Comment: (NOTE) Elevated high sensitivity troponin I (hsTnI) values and significant  changes across serial measurements may suggest ACS but many other  chronic and acute conditions are known to  elevate  hsTnI results.  Refer to the "Links" section for chest pain algorithms and additional  guidance. Performed at [facility], [address], [place], [place] [address]   I-Stat Chem 8, ED     Status: Abnormal   Collection Time: 04/28/23  3:06 PM  Result Value Ref Range   Sodium 140 135 - 145 mmol/L   Potassium 3.6 3.5 - 5.1 mmol/L   Chloride 88 (L) 98 - 111 mmol/L   BUN 21 8 - 23 mg/dL   Creatinine, Ser 5.78 0.61 - 1.24 mg/dL   Glucose, Bld 469 (H) 70 - 99 mg/dL    Comment: Glucose reference range applies only to samples taken after fasting for at least 8 hours.   Calcium, Ion 1.10 (L) 1.15 - 1.40 mmol/L   TCO2 46 (H) 22 - 32 mmol/L   Hemoglobin 13.6 13.0 - 17.0 g/dL   HCT 62.9 52.8 - 41.3 %   DG Chest Port 1 View  Result Date: 04/28/2023 CLINICAL DATA:  dyspnea EXAM: PORTABLE CHEST 1 VIEW COMPARISON:  April 09, 2023 FINDINGS: The cardiomediastinal silhouette is unchanged in contour.Emphysematous changes. No pleural effusion. No pneumothorax. No acute pleuroparenchymal abnormality. IMPRESSION: No acute cardiopulmonary abnormality. Given underlying emphysema, recommend evaluation for candidacy for annual lung cancer screening CT. Electronically Signed   By: Meda Klinefelter M.D.   On: 04/28/2023 16:25    Pending Labs Unresulted Labs (From admission, onward)     Start     Ordered   04/29/23 0500  Basic metabolic panel  Tomorrow morning,   R        04/28/23 1706   04/29/23 0500  CBC  Tomorrow morning,   R        04/28/23 1706            Vitals/Pain Today's Vitals   04/28/23 1442 04/28/23 1530 04/28/23 1630 04/28/23 1700  BP: 125/72 117/72 113/82 105/67  Pulse:  100 (!) 106 90  Resp:  (!) 24 (!) 23 20  Temp: 98 F (36.7 C)     TempSrc: Oral     SpO2: (!) 89% 100% 98% 100%  Weight:      Height:      PainSc:        Isolation Precautions No active isolations  Medications Medications  potassium chloride SA (KLOR-CON M) CR tablet 40 mEq  (has no administration in time range)  furosemide (LASIX) injection 20 mg (has no administration in time range)  aspirin EC tablet 81 mg (has no administration in time range)  rosuvastatin (CRESTOR) tablet 40 mg (has no administration in time range)  famotidine (PEPCID) tablet 20 mg (has no administration in time range)  ipratropium-albuterol (DUONEB) 0.5-2.5 (3) MG/3ML nebulizer solution 3 mL (has no administration in time range)  ipratropium-albuterol (DUONEB) 0.5-2.5 (3) MG/3ML nebulizer solution 3 mL (has no administration in time range)  enoxaparin (LOVENOX) injection 40 mg (has no administration in time range)  acetaminophen (TYLENOL) tablet 650 mg (has no administration in time range)    Or  acetaminophen (TYLENOL) suppository 650 mg (has no administration in time range)  traZODone (DESYREL) tablet 25 mg (has no administration in time range)  ondansetron (ZOFRAN) tablet 4 mg (has no administration in time range)    Or  ondansetron (ZOFRAN) injection 4 mg (has no administration in time range)  albuterol (PROVENTIL) (2.5 MG/3ML) 0.083% nebulizer solution 2.5 mg (has no administration in time range)  feeding supplement (ENSURE ENLIVE / ENSURE PLUS) liquid 237 mL (has  no administration in time range)  methylPREDNISolone sodium succinate (SOLU-MEDROL) 125 mg/2 mL injection 125 mg (125 mg Intravenous Given 04/28/23 1506)  ipratropium-albuterol (DUONEB) 0.5-2.5 (3) MG/3ML nebulizer solution 3 mL (3 mLs Nebulization Given 04/28/23 1504)  albuterol (PROVENTIL) (2.5 MG/3ML) 0.083% nebulizer solution 10 mg (10 mg Nebulization Given 04/28/23 1504)  cefTRIAXone (ROCEPHIN) 1 g in sodium chloride 0.9 % 100 mL IVPB (0 g Intravenous Stopped 04/28/23 1606)  doxycycline (VIBRA-TABS) tablet 100 mg (100 mg Oral Given 04/28/23 1535)    Mobility walks

## 2023-04-28 NOTE — ED Triage Notes (Signed)
Patient brought in by EMS due to shortness of breath that just started this morning. Pt reports increase in shortness of breath with exertion. Has a history of COPD. EMS reports on 4L patient's O2 was 99%, dropped patient down to 2L and was 94-96%. Pt wears 2L O2 at all times. No other complaints.

## 2023-04-29 DIAGNOSIS — J441 Chronic obstructive pulmonary disease with (acute) exacerbation: Secondary | ICD-10-CM | POA: Diagnosis not present

## 2023-04-29 DIAGNOSIS — J9611 Chronic respiratory failure with hypoxia: Secondary | ICD-10-CM | POA: Diagnosis not present

## 2023-04-29 DIAGNOSIS — I5032 Chronic diastolic (congestive) heart failure: Secondary | ICD-10-CM | POA: Diagnosis not present

## 2023-04-29 DIAGNOSIS — J9612 Chronic respiratory failure with hypercapnia: Secondary | ICD-10-CM | POA: Diagnosis not present

## 2023-04-29 LAB — RESPIRATORY PANEL BY PCR

## 2023-04-29 LAB — BASIC METABOLIC PANEL
Anion gap: 12 (ref 5–15)
BUN: 13 mg/dL (ref 8–23)
CO2: 33 mmol/L — ABNORMAL HIGH (ref 22–32)
Calcium: 8.7 mg/dL — ABNORMAL LOW (ref 8.9–10.3)
Chloride: 94 mmol/L — ABNORMAL LOW (ref 98–111)
Creatinine, Ser: 0.51 mg/dL — ABNORMAL LOW (ref 0.61–1.24)
GFR, Estimated: >60 mL/min (ref >60)
Glucose, Bld: 97 mg/dL (ref 70–99)
Potassium: 4.5 mmol/L (ref 3.5–5.1)
Sodium: 139 mmol/L (ref 135–145)

## 2023-04-29 LAB — CBC
HCT: 36.2 % — ABNORMAL LOW (ref 39.0–52.0)
Hemoglobin: 10.9 g/dL — ABNORMAL LOW (ref 13.0–17.0)
MCH: 31.5 pg (ref 26.0–34.0)
MCHC: 30.1 g/dL (ref 30.0–36.0)
MCV: 104.6 fL — ABNORMAL HIGH (ref 80.0–100.0)
Platelets: 141 10*3/uL — ABNORMAL LOW (ref 150–400)
RBC: 3.46 MIL/uL — ABNORMAL LOW (ref 4.22–5.81)
RDW: 12.4 % (ref 11.5–15.5)
WBC: 5.5 10*3/uL (ref 4.0–10.5)
nRBC: 0 % (ref 0.0–0.2)

## 2023-04-29 LAB — FOLATE: Folate: 8.8 ng/mL (ref >5.9)

## 2023-04-29 LAB — VITAMIN B12: Vitamin B-12: 650 pg/mL (ref 180–914)

## 2023-04-29 MED ORDER — METHYLPREDNISOLONE SODIUM SUCC 125 MG IJ SOLR
60.0000 mg | Freq: Every day | INTRAMUSCULAR | Status: DC
  Administered 2023-04-29 – 2023-05-01 (×3): 60 mg via INTRAVENOUS
  Filled 2023-04-29 (×3): qty 2

## 2023-04-29 NOTE — Hospital Course (Addendum)
Anthony Nelson is a 62 yo male with PMH COPD, chronic hypoxia on 2L 02, pulmonary hypertension, GERD, diastolic CHF and other comorbidities who presented with shortness of breath. Symptoms have been ongoing for several days and he began having nonproductive cough and sensation of difficulty clearing sputum but he feels more short winded. Slightly hypoxic on admission, 89% and placed on 3 L.  VBG notable for hypercarbia and pH 7.28.  He was placed on BiPAP and admitted for further workup and monitoring.  He is slowly improving however he continues to desaturate but continues to improve daily.  Will watch him another night and continue IV steroids and taper down to 40 twice daily.  Repeat chest x-ray this a.m. showed no active disease and hematuria on O2 screen done showed that he needs at least 4 L while ambulating.  Patient was to be discharged back to the service center house today however was informed that he is not able to return.  Presently does not have a safe discharge disposition and the TOC team is trying to find a safe discharge disposition for him  Assessment and Plan:  COPD with Acute Exacerbation (HCC), improving -Mainly dyspnea and tight breath sounds; nonproductive cough -Desaturates and when working with PT the patient dropped to 86% just getting to the edge of the bed. Dropped to 88% on 4lnc with ambulation. -Added guaifenesin 1200 ohms p.o. twice daily and continue spirometry and flutter valve -Continue with breathing treatments with DuoNeb 3 mL 3 times daily and add Brovana 15 mcg neb twice daily and budesonide 0.25 mg p.o. twice daily; also has albuterol 2.5 mg Neb every 2 as needed for wheezing -IV Steroids now stopped and changed to po Prednisone Taper now -Continue BiPAP as needed and at night; uses BiPAP at Home SpO2: 99 % O2 Flow Rate (L/min): 3 L/min FiO2 (%): 35 % -Continuous pulse oximetry and maintain O2 saturations greater than 90% -Continue supplemental oxygen via nasal  cannula and wean to home regimen -Repeat chest x-ray showed no active disease with ambulatory home O2 screen done and he did desaturate on 2 L to 84% but came back up to 90% on 4 Liters    Homeless -Used to Resides at a shelter.  They were requesting PT eval prior to returning and initially they recommended no follow up but are now recommending SNF. -TOC to be involved; Plan was for the patient to Return to the [facility] however he was informed by the caseworker that the [facility] has reported concerns with the patient's ability to care for himself and have now told the caseworker that the patient not able to return to the [facility].  Currently patient does not have a safe discharge disposition but PT/OT now recommending SNF   GERD/GI Prophylaxis -Continue Famotidine 20 mg po BID   Hyperlipidemia -Continue Rosuvastatin 40 mg po Daily    Chronic Respiratory Failure with Hypoxia and Hypercapnia (HCC) -Chronic 2L at home SpO2: 99 % O2 Flow Rate (L/min): 3 L/min FiO2 (%): 35 % -Placed on 3L initially then was placed on BiPAP and now stable off BiPAP but we're using it at night.  He states that he has a BiPAP at home and will continue this at discharge -Patient desaturated on ambulatory home O2 screen and had to be placed on 4 L while ambulating he may need to increase his oxygen while ambulating at discharge   Chronic Diastolic Heart Failure (HCC) -No evidence of acute decompensation of heart failure -Last  TTE 04/18/2022 with LVEF 50-55%, LV with no regional wall trauma maladies, RV mildly enlarged, trivial MR.   .Strict I's and O's and Daily Weights;  Intake/Output Summary (Last 24 hours) at 05/05/2023 1418 Last data filed at 05/05/2023 0930 Gross per 24 hour  Intake --  Output 475 ml  Net -475 ml  -Only on lasix PRN at home; will resume if needed -Continue to monitor for signs or symptoms of volume overload  Macrocytic Anemia -Hgb/Hct Trend: Recent Labs  Lab  04/10/23 0607 04/28/23 1505 04/28/23 1506 04/29/23 0633 04/30/23 0700 05/02/23 0407 05/05/23 0417  HGB 11.5* 12.0* 13.6 10.9* 10.5* 11.5* 11.5*  HCT 38.3* 40.7 40.0 36.2* 33.9* 36.8* 38.2*  MCV 103.2* 106.3*  --  104.6* 102.7* 101.7* 105.2*  -Checked Anemia Panel showed an iron level 54, UIBC 258, TIBC 312, saturation ratios of 17%, ferritin level 142, folate of 6.4, vitamin B12 level 488 -Continue to Monitor for S/Sx of Bleeding; No overt bleeding noted -Repeat CBC in the AM   Thrombocytopenia -Platelet Count Trend: Recent Labs  Lab 04/09/23 2246 04/10/23 0607 04/28/23 1505 04/29/23 0633 04/30/23 0700 05/02/23 0407 05/05/23 0417  PLT 139* 144* 159 141* 148* 159 203  -Continue to Monitor for S/Sx of Bleeding; No overt bleeding noted  -Repeat CBC in the AM   Severe Protein Calorie Malnutrition Nutrition Status: Nutrition Problem: Severe Malnutrition Etiology: chronic illness (COPD) Signs/Symptoms: severe fat depletion, severe muscle depletion Interventions: Refer to RD note for recommendations, Ensure Enlive (each supplement provides 350kcal and 20 grams of protein), MVI, Liberalize Diet  Hypoalbuminemia -Patient's Albumin Trend: Recent Labs  Lab 04/10/23 0607 04/28/23 1505 05/02/23 0407 05/05/23 0417  ALBUMIN 3.2* 3.6 3.4* 3.3*  -Continue to Monitor and Trend and repeat CMP in the AM

## 2023-04-29 NOTE — Assessment & Plan Note (Signed)
-   continue pepcid 

## 2023-04-29 NOTE — Assessment & Plan Note (Signed)
-  no evidence of acute heart failure, note last. TTE 04/18/2022 with LVEF 50-55%, LV with no regional wall trauma maladies, RV mildly enlarged, trivial MR.   - only on lasix PRN at home; will resume if needed

## 2023-04-29 NOTE — TOC Initial Note (Addendum)
Transition of Care [facility]) - Initial/Assessment Note    Patient Details  Name: Anthony Nelson MRN: [id number] Date of Birth: 1961-01-23  Transition of Care [facility]) CM/SW Contact:    Darleene Cleaver, LCSW Phone Number: 04/29/2023, 3:31 PM  Clinical Narrative:                  Patient is a 62 year old male who is alert and oriented x4.  Patient is on airborne precautions.  CSW completed assessment by reviewing patient's chart.  Patient has been staying at the [facility] which is a boarding house for several months.  Patient also on chronic 2L of oxygen.  Patient has an estranged wife, but does have a daughter who he is in contact with.  Bedside nurse received a phone call from [facility], saying that they are not a medical facility and they are not sure, they can accept him back after hospital stay.  CSW requested PT and OT to see patient to see if he qualifies for SNF placement for short term rehab.  Patient has VA insurance and [employer], CSW can attempt to use his [facility] Medicare benefits for short term rehab if he qualifies.  CSW to continue to follow patient's progress throughout discharge planning.     Expected Discharge Plan: Skilled Nursing Facility Barriers to Discharge: Continued Medical Work up   Patient Goals and CMS Choice Patient states their goals for this hospitalization and ongoing recovery are:: To go to rehab, then return to servant house. CMS Medicare.gov Compare Post Acute Care list provided to:: Patient Choice offered to / list presented to : Patient [place] ownership interest in [facility].provided to:: Patient    Expected Discharge Plan and Services       Living arrangements for the past 2 months: Boarding House                                      Prior Living Arrangements/Services Living arrangements for the past 2 months: [employer] Lives with:: Facility Resident Patient language and need for interpreter reviewed::  Yes Do you feel safe going back to the place where you live?: No   Patient may need rehab prior to discharging.  Need for Family Participation in Patient Care: No (Comment) Care giver support system in place?: No (comment)   Criminal Activity/Legal Involvement Pertinent to Current Situation/Hospitalization: No - Comment as needed  Activities of Daily Living   ADL Screening (condition at time of admission) Independently performs ADLs?: Yes (appropriate for developmental age) Is the patient deaf or have difficulty hearing?: No Does the patient have difficulty seeing, even when wearing glasses/contacts?: No Does the patient have difficulty concentrating, remembering, or making decisions?: No  Permission Sought/Granted Permission sought to share information with : Case Manager, Magazine features editor, Family Supports Permission granted to share information with : Yes, Verbal Permission Granted, Yes, Release of Information Signed  Share Information with NAME: Corpening,Endiah Daughter   [phone]  Permission granted to share info w AGENCY: SNF admissions        Emotional Assessment Appearance:: Appears stated age   Affect (typically observed): Accepting, Calm, Appropriate Orientation: : Oriented to Self, Oriented to Place, Oriented to  Time, Oriented to Situation Alcohol / Substance Use: Tobacco Use Psych Involvement: No (comment)  Admission diagnosis:  COPD exacerbation (HCC) [J44.1] COPD with acute exacerbation (HCC) [J44.1] Acute on chronic respiratory  failure with hypercapnia (HCC) [J96.22] Patient Active Problem List   Diagnosis Date Noted   COPD with acute exacerbation (HCC) 04/28/2023   Protein-calorie malnutrition, severe 04/11/2023   COPD exacerbation (HCC) 04/09/2023   Chronic right-sided congestive heart failure (HCC) 01/16/2023   Homeless 01/16/2023   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   Gastroesophageal reflux disease 01/29/2022   Hyperlipidemia 10/04/2020    Macrocytosis 09/03/2020   Atherosclerosis of aorta (HCC) 04/06/2020   Weight loss 08/11/2019   Chronic diastolic heart failure (HCC) 04/25/2019   Chronic respiratory failure with hypoxia and hypercapnia (HCC) 04/25/2019   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, [address] [place] [address] Phone: [phone] Fax: [phone]     Social Determinants of Health (SDOH) Social History: SDOH Screenings   Food Insecurity: No Food Insecurity (04/28/2023)  Recent Concern: Food Insecurity - Food Insecurity Present (04/10/2023)  Housing: Low Risk  (04/28/2023)  Recent Concern: Housing - Medium Risk (04/10/2023)  Transportation Needs: No Transportation Needs (04/29/2023)  Recent Concern: Transportation Needs - Unmet Transportation Needs (04/28/2023)  Utilities: Not At Risk (04/28/2023)  Alcohol Screen: Low Risk  (12/26/2022)  Depression (PHQ2-9): Low Risk  (01/16/2023)  Physical Activity: Insufficiently Active (12/26/2022)  Social Connections: Moderately Isolated (12/26/2022)  Stress: No Stress Concern Present (12/26/2022)  Tobacco Use: Medium Risk (04/28/2023)   SDOH Interventions: Transportation Interventions: Inpatient TOC   Readmission Risk Interventions     No data to display

## 2023-04-29 NOTE — Assessment & Plan Note (Signed)
continue crestor

## 2023-04-29 NOTE — Assessment & Plan Note (Signed)
-   chronic 2L at home - placed on 3L initially then bipap -Now stable off BiPAP but we're using it at night. He is not on NIV at home

## 2023-04-29 NOTE — Progress Notes (Signed)
Progress Note    Anthony Nelson   BMW:[id number]  DOB: 1960-09-29  DOA: 04/28/2023     1 PCP: Storm Frisk, MD  Initial CC: Shortness of breath  Hospital Course: Mr. Stansbery is a 61 yo male with PMH COPD, chronic hypoxia on 2L 02, pulmonary hypertension, GERD, diastolic CHF who presented with shortness of breath. Symptoms have been ongoing for several days and he began having nonproductive cough and sensation of difficulty clearing sputum. Slightly hypoxic on admission, 89% and placed on 3 L.  VBG notable for hypercarbia and pH 7.28.  He was placed on BiPAP and admitted for further workup and monitoring.  Interval History:  Seen this morning in the ICU after BiPAP had been removed.  Breathing better now and normal mentation.  Assessment and Plan: COPD with acute exacerbation (HCC) - mainly dyspnea and tight breath sounds; nonproductive cough -Continue on breathing treatments and steroids - BiPAP as needed and at night  Gastroesophageal reflux disease - continue pepcid  Hyperlipidemia - continue crestor  Chronic respiratory failure with hypoxia and hypercapnia (HCC) - chronic 2L at home - placed on 3L initially then bipap -Now stable off BiPAP but states he does use it at night at home  Chronic diastolic heart failure (HCC) -no evidence of acute heart failure, note last. TTE 04/18/2022 with LVEF 50-55%, LV with no regional wall trauma maladies, RV mildly enlarged, trivial MR.   - Will continue cardiac medications, including Lasix p.o.    Old records reviewed in assessment of this patient  Antimicrobials:   DVT prophylaxis:  enoxaparin (LOVENOX) injection 40 mg Start: 04/28/23 2200 SCDs Start: 04/28/23 1706   Code Status:   Code Status: Full Code  Mobility Assessment (Last 72 Hours)     Mobility Assessment     Row Name 04/29/23 0800 04/28/23 2000         Does patient have an order for bedrest or is patient medically unstable No - Continue assessment No -  Continue assessment      What is the highest level of mobility based on the progressive mobility assessment? Level 4 (Walks with assist in room) - Balance while marching in place and cannot step forward and back - Complete Level 4 (Walks with assist in room) - Balance while marching in place and cannot step forward and back - Complete      Is the above level different from baseline mobility prior to current illness? Yes - Recommend PT order Yes - Recommend PT order               Barriers to discharge: none Disposition Plan:  Home Status is: Inpt  Objective: Blood pressure 97/83, pulse 89, temperature 98 F (36.7 C), temperature source Oral, resp. rate 19, height 6' (1.829 m), weight 59 kg, SpO2 96%.  Examination:  Physical Exam Constitutional:      General: He is not in acute distress.    Appearance: Normal appearance. He is well-developed. He is not ill-appearing.  HENT:     Head: Normocephalic and atraumatic.     Mouth/Throat:     Mouth: Mucous membranes are moist.  Eyes:     Extraocular Movements: Extraocular movements intact.  Cardiovascular:     Rate and Rhythm: Normal rate and regular rhythm.  Pulmonary:     Effort: Pulmonary effort is normal. No respiratory distress.     Breath sounds: No wheezing.     Comments: Slightly tight breath sounds Abdominal:     General:  Bowel sounds are normal. There is no distension.     Palpations: Abdomen is soft.     Tenderness: There is no abdominal tenderness.  Musculoskeletal:        General: Normal range of motion.     Cervical back: Normal range of motion and neck supple.  Skin:    General: Skin is warm and dry.  Neurological:     General: No focal deficit present.     Mental Status: He is alert.  Psychiatric:        Mood and Affect: Mood normal.        Behavior: Behavior normal.      Consultants:    Procedures:    Data Reviewed: Results for orders placed or performed during the hospital encounter of 04/28/23 (from  the past 24 hour(s))  Brain natriuretic peptide     Status: None   Collection Time: 04/28/23  2:47 PM  Result Value Ref Range   B Natriuretic Peptide 12.8 0.0 - 100.0 pg/mL  Resp panel by RT-PCR (RSV, Flu A&B, Covid) Anterior Nasal Swab     Status: None   Collection Time: 04/28/23  3:05 PM   Specimen: Anterior Nasal Swab  Result Value Ref Range   SARS Coronavirus 2 by RT PCR NEGATIVE NEGATIVE   Influenza A by PCR NEGATIVE NEGATIVE   Influenza B by PCR NEGATIVE NEGATIVE   Resp Syncytial Virus by PCR NEGATIVE NEGATIVE  Basic metabolic panel     Status: Abnormal   Collection Time: 04/28/23  3:05 PM  Result Value Ref Range   Sodium 141 135 - 145 mmol/L   Potassium 3.1 (L) 3.5 - 5.1 mmol/L   Chloride 91 (L) 98 - 111 mmol/L   CO2 41 (H) 22 - 32 mmol/L   Glucose, Bld 144 (H) 70 - 99 mg/dL   BUN 16 8 - 23 mg/dL   Creatinine, Ser 4.09 (L) 0.61 - 1.24 mg/dL   Calcium 8.9 8.9 - 81.1 mg/dL   GFR, Estimated >91 >47 mL/min   Anion gap 9 5 - 15  Hepatic function panel     Status: None   Collection Time: 04/28/23  3:05 PM  Result Value Ref Range   Total Protein 7.1 6.5 - 8.1 g/dL   Albumin 3.6 3.5 - 5.0 g/dL   AST 18 15 - 41 U/L   ALT 19 0 - 44 U/L   Alkaline Phosphatase 48 38 - 126 U/L   Total Bilirubin 0.7 0.3 - 1.2 mg/dL   Bilirubin, Direct 0.1 0.0 - 0.2 mg/dL   Indirect Bilirubin 0.6 0.3 - 0.9 mg/dL  Magnesium     Status: None   Collection Time: 04/28/23  3:05 PM  Result Value Ref Range   Magnesium 1.8 1.7 - 2.4 mg/dL  Blood gas, venous     Status: Abnormal   Collection Time: 04/28/23  3:05 PM  Result Value Ref Range   pH, Ven 7.28 7.25 - 7.43   pCO2, Ven 104 (HH) 44 - 60 mmHg   pO2, Ven <31 (LL) 32 - 45 mmHg   Bicarbonate 48.9 (H) 20.0 - 28.0 mmol/L   Acid-Base Excess 16.9 (H) 0.0 - 2.0 mmol/L   O2 Saturation 41.7 %   Patient temperature 37.0   CBC with Differential/Platelet     Status: Abnormal   Collection Time: 04/28/23  3:05 PM  Result Value Ref Range   WBC 7.4 4.0 -  10.5 K/uL   RBC 3.83 (L) 4.22 - 5.81 MIL/uL  Hemoglobin 12.0 (L) 13.0 - 17.0 g/dL   HCT 40.9 81.1 - 91.4 %   MCV 106.3 (H) 80.0 - 100.0 fL   MCH 31.3 26.0 - 34.0 pg   MCHC 29.5 (L) 30.0 - 36.0 g/dL   RDW 78.2 95.6 - 21.3 %   Platelets 159 150 - 400 K/uL   nRBC 0.0 0.0 - 0.2 %   Neutrophils Relative % 84 %   Neutro Abs 6.2 1.7 - 7.7 K/uL   Lymphocytes Relative 7 %   Lymphs Abs 0.5 (L) 0.7 - 4.0 K/uL   Monocytes Relative 7 %   Monocytes Absolute 0.5 0.1 - 1.0 K/uL   Eosinophils Relative 2 %   Eosinophils Absolute 0.1 0.0 - 0.5 K/uL   Basophils Relative 0 %   Basophils Absolute 0.0 0.0 - 0.1 K/uL   Immature Granulocytes 0 %   Abs Immature Granulocytes 0.02 0.00 - 0.07 K/uL  Troponin I (High Sensitivity)     Status: None   Collection Time: 04/28/23  3:05 PM  Result Value Ref Range   Troponin I (High Sensitivity) 4 <18 ng/L  I-Stat Chem 8, ED     Status: Abnormal   Collection Time: 04/28/23  3:06 PM  Result Value Ref Range   Sodium 140 135 - 145 mmol/L   Potassium 3.6 3.5 - 5.1 mmol/L   Chloride 88 (L) 98 - 111 mmol/L   BUN 21 8 - 23 mg/dL   Creatinine, Ser 0.86 0.61 - 1.24 mg/dL   Glucose, Bld 578 (H) 70 - 99 mg/dL   Calcium, Ion 4.69 (L) 1.15 - 1.40 mmol/L   TCO2 46 (H) 22 - 32 mmol/L   Hemoglobin 13.6 13.0 - 17.0 g/dL   HCT 62.9 52.8 - 41.3 %  Troponin I (High Sensitivity)     Status: None   Collection Time: 04/28/23  5:20 PM  Result Value Ref Range   Troponin I (High Sensitivity) 5 <18 ng/L  MRSA Next Gen by PCR, Nasal     Status: None   Collection Time: 04/28/23  8:06 PM   Specimen: Nasal Mucosa; Nasal Swab  Result Value Ref Range   MRSA by PCR Next Gen NOT DETECTED NOT DETECTED  Basic metabolic panel     Status: Abnormal   Collection Time: 04/29/23  3:45 AM  Result Value Ref Range   Sodium 139 135 - 145 mmol/L   Potassium 4.5 3.5 - 5.1 mmol/L   Chloride 94 (L) 98 - 111 mmol/L   CO2 33 (H) 22 - 32 mmol/L   Glucose, Bld 97 70 - 99 mg/dL   BUN 13 8 - 23 mg/dL    Creatinine, Ser 2.44 (L) 0.61 - 1.24 mg/dL   Calcium 8.7 (L) 8.9 - 10.3 mg/dL   GFR, Estimated >01 >02 mL/min   Anion gap 12 5 - 15  CBC     Status: Abnormal   Collection Time: 04/29/23  6:33 AM  Result Value Ref Range   WBC 5.5 4.0 - 10.5 K/uL   RBC 3.46 (L) 4.22 - 5.81 MIL/uL   Hemoglobin 10.9 (L) 13.0 - 17.0 g/dL   HCT 72.5 (L) 36.6 - 44.0 %   MCV 104.6 (H) 80.0 - 100.0 fL   MCH 31.5 26.0 - 34.0 pg   MCHC 30.1 30.0 - 36.0 g/dL   RDW 34.7 42.5 - 95.6 %   Platelets 141 (L) 150 - 400 K/uL   nRBC 0.0 0.0 - 0.2 %    I have reviewed  pertinent nursing notes, vitals, labs, and images as necessary. I have ordered labwork to follow up on as indicated.  I have reviewed the last notes from staff over past 24 hours. I have discussed patient's care plan and test results with nursing staff, CM/SW, and other staff as appropriate.  Time spent: Greater than 50% of the 55 minute visit was spent in counseling/coordination of care for the patient as laid out in the A&P.   LOS: 1 day   Lewie Chamber, MD [facility] 04/29/2023, 11:20 AM

## 2023-04-29 NOTE — Progress Notes (Signed)
RT took pt off of Bipap to see how pt will do. Patient is tolerating well at this time and RR is 22 and Sats are 100% on a 3L salter

## 2023-04-29 NOTE — Assessment & Plan Note (Signed)
-   mainly dyspnea and tight breath sounds; nonproductive cough -Continue on breathing treatments and steroids - BiPAP as needed and at night

## 2023-04-29 NOTE — Progress Notes (Signed)
   04/29/23 2322  BiPAP/CPAP/SIPAP  BiPAP/CPAP/SIPAP Pt Type Adult  BiPAP/CPAP/SIPAP V60  Mask Type Full face mask  Mask Size Medium  Set Rate 12 breaths/min  Respiratory Rate 14 breaths/min  IPAP 12 cmH20  EPAP 5 cmH2O  FiO2 (%) 35 %  Minute Ventilation 8.5  Leak 0  Peak Inspiratory Pressure (PIP) 13  Tidal Volume (Vt) 580  Patient Home Equipment No  Auto Titrate No  Press High Alarm 30 cmH2O  Press Low Alarm 5 cmH2O

## 2023-04-30 DIAGNOSIS — J9612 Chronic respiratory failure with hypercapnia: Secondary | ICD-10-CM | POA: Diagnosis not present

## 2023-04-30 DIAGNOSIS — J441 Chronic obstructive pulmonary disease with (acute) exacerbation: Secondary | ICD-10-CM | POA: Diagnosis not present

## 2023-04-30 DIAGNOSIS — J9611 Chronic respiratory failure with hypoxia: Secondary | ICD-10-CM | POA: Diagnosis not present

## 2023-04-30 LAB — CBC
HCT: 33.9 % — ABNORMAL LOW (ref 39.0–52.0)
Hemoglobin: 10.5 g/dL — ABNORMAL LOW (ref 13.0–17.0)
MCH: 31.8 pg (ref 26.0–34.0)
MCHC: 31 g/dL (ref 30.0–36.0)
MCV: 102.7 fL — ABNORMAL HIGH (ref 80.0–100.0)
Platelets: 148 10*3/uL — ABNORMAL LOW (ref 150–400)
RBC: 3.3 MIL/uL — ABNORMAL LOW (ref 4.22–5.81)
RDW: 12.4 % (ref 11.5–15.5)
WBC: 6.8 10*3/uL (ref 4.0–10.5)
nRBC: 0 % (ref 0.0–0.2)

## 2023-04-30 LAB — BASIC METABOLIC PANEL
Anion gap: 9 (ref 5–15)
BUN: 18 mg/dL (ref 8–23)
CO2: 40 mmol/L — ABNORMAL HIGH (ref 22–32)
Calcium: 8.7 mg/dL — ABNORMAL LOW (ref 8.9–10.3)
Chloride: 93 mmol/L — ABNORMAL LOW (ref 98–111)
Creatinine, Ser: 0.88 mg/dL (ref 0.61–1.24)
GFR, Estimated: >60 mL/min (ref >60)
Glucose, Bld: 88 mg/dL (ref 70–99)
Potassium: 3.8 mmol/L (ref 3.5–5.1)
Sodium: 142 mmol/L (ref 135–145)

## 2023-04-30 MED ORDER — IPRATROPIUM-ALBUTEROL 0.5-2.5 (3) MG/3ML IN SOLN
3.0000 mL | Freq: Three times a day (TID) | RESPIRATORY_TRACT | Status: DC
  Administered 2023-04-30 – 2023-05-03 (×8): 3 mL via RESPIRATORY_TRACT
  Filled 2023-04-30 (×8): qty 3

## 2023-04-30 NOTE — Progress Notes (Signed)
PT Cancellation Note  Patient Details Name: Anthony Nelson MRN: [id number] DOB: July 23, 1960   Cancelled Treatment:    Reason Eval/Treat Not Completed: Other (comment) Patient speaking with another team member. Will check back another time. Blanchard Kelch PT Acute Rehabilitation Services Office [phone] Weekend [phone]   Rada Hay 04/30/2023, 2:20 PM

## 2023-04-30 NOTE — Progress Notes (Signed)
Progress Note    Anthony Nelson   ZHY:[id number]  DOB: 03/24/1961  DOA: 04/28/2023     2 PCP: Storm Frisk, MD  Initial CC: Shortness of breath  Hospital Course: Mr. Weinmann is a 62 yo male with PMH COPD, chronic hypoxia on 2L 02, pulmonary hypertension, GERD, diastolic CHF who presented with shortness of breath. Symptoms have been ongoing for several days and he began having nonproductive cough and sensation of difficulty clearing sputum. Slightly hypoxic on admission, 89% and placed on 3 L.  VBG notable for hypercarbia and pH 7.28.  He was placed on BiPAP and admitted for further workup and monitoring.  Interval History:  No events overnight.  Still has somewhat tight breath sounds but overall remains much more comfortable and improved.  Assessment and Plan: * COPD with acute exacerbation (HCC) - mainly dyspnea and tight breath sounds; nonproductive cough -Continue on breathing treatments and steroids - BiPAP as needed and at night  Homeless - Resides at a shelter.  They are requesting PT eval prior to returning  Gastroesophageal reflux disease - continue pepcid  Hyperlipidemia - continue crestor  Chronic respiratory failure with hypoxia and hypercapnia (HCC) - chronic 2L at home - placed on 3L initially then bipap -Now stable off BiPAP but we're using it at night. He is not on NIV at home  Chronic diastolic heart failure (HCC) -no evidence of acute heart failure, note last. TTE 04/18/2022 with LVEF 50-55%, LV with no regional wall trauma maladies, RV mildly enlarged, trivial MR.   - only on lasix PRN at home; will resume if needed   Old records reviewed in assessment of this patient  Antimicrobials:   DVT prophylaxis:  enoxaparin (LOVENOX) injection 40 mg Start: 04/28/23 2200 SCDs Start: 04/28/23 1706   Code Status:   Code Status: Full Code  Mobility Assessment (Last 72 Hours)     Mobility Assessment     Row Name 04/29/23 2000 04/29/23 0800 04/28/23  2000       Does patient have an order for bedrest or is patient medically unstable No - Continue assessment No - Continue assessment No - Continue assessment     What is the highest level of mobility based on the progressive mobility assessment? Level 4 (Walks with assist in room) - Balance while marching in place and cannot step forward and back - Complete Level 4 (Walks with assist in room) - Balance while marching in place and cannot step forward and back - Complete Level 4 (Walks with assist in room) - Balance while marching in place and cannot step forward and back - Complete     Is the above level different from baseline mobility prior to current illness? -- Yes - Recommend PT order Yes - Recommend PT order              Barriers to discharge: none Disposition Plan:  Home/shelter Status is: Inpt  Objective: Blood pressure (!) 91/59, pulse 73, temperature 98.3 F (36.8 C), temperature source Oral, resp. rate 17, height 6' (1.829 m), weight 59 kg, SpO2 95%.  Examination:  Physical Exam Constitutional:      General: He is not in acute distress.    Appearance: Normal appearance. He is well-developed. He is not ill-appearing.  HENT:     Head: Normocephalic and atraumatic.     Mouth/Throat:     Mouth: Mucous membranes are moist.  Eyes:     Extraocular Movements: Extraocular movements intact.  Cardiovascular:  Rate and Rhythm: Normal rate and regular rhythm.  Pulmonary:     Effort: Pulmonary effort is normal. No respiratory distress.     Breath sounds: No wheezing.     Comments: Slightly tight breath sounds Abdominal:     General: Bowel sounds are normal. There is no distension.     Palpations: Abdomen is soft.     Tenderness: There is no abdominal tenderness.  Musculoskeletal:        General: Normal range of motion.     Cervical back: Normal range of motion and neck supple.  Skin:    General: Skin is warm and dry.  Neurological:     General: No focal deficit present.      Mental Status: He is alert.  Psychiatric:        Mood and Affect: Mood normal.        Behavior: Behavior normal.      Consultants:    Procedures:    Data Reviewed: Results for orders placed or performed during the hospital encounter of 04/28/23 (from the past 24 hour(s))  Vitamin B12     Status: None   Collection Time: 04/29/23  2:42 PM  Result Value Ref Range   Vitamin B-12 650 180 - 914 pg/mL  Folate     Status: None   Collection Time: 04/29/23  2:42 PM  Result Value Ref Range   Folate 8.8 >5.9 ng/mL  CBC     Status: Abnormal   Collection Time: 04/30/23  7:00 AM  Result Value Ref Range   WBC 6.8 4.0 - 10.5 K/uL   RBC 3.30 (L) 4.22 - 5.81 MIL/uL   Hemoglobin 10.5 (L) 13.0 - 17.0 g/dL   HCT 40.9 (L) 81.1 - 91.4 %   MCV 102.7 (H) 80.0 - 100.0 fL   MCH 31.8 26.0 - 34.0 pg   MCHC 31.0 30.0 - 36.0 g/dL   RDW 78.2 95.6 - 21.3 %   Platelets 148 (L) 150 - 400 K/uL   nRBC 0.0 0.0 - 0.2 %  Basic metabolic panel     Status: Abnormal   Collection Time: 04/30/23  7:00 AM  Result Value Ref Range   Sodium 142 135 - 145 mmol/L   Potassium 3.8 3.5 - 5.1 mmol/L   Chloride 93 (L) 98 - 111 mmol/L   CO2 40 (H) 22 - 32 mmol/L   Glucose, Bld 88 70 - 99 mg/dL   BUN 18 8 - 23 mg/dL   Creatinine, Ser 0.86 0.61 - 1.24 mg/dL   Calcium 8.7 (L) 8.9 - 10.3 mg/dL   GFR, Estimated >57 >84 mL/min   Anion gap 9 5 - 15    I have reviewed pertinent nursing notes, vitals, labs, and images as necessary. I have ordered labwork to follow up on as indicated.  I have reviewed the last notes from staff over past 24 hours. I have discussed patient's care plan and test results with nursing staff, CM/SW, and other staff as appropriate.  Time spent: Greater than 50% of the 55 minute visit was spent in counseling/coordination of care for the patient as laid out in the A&P.   LOS: 2 days   Lewie Chamber, MD [facility] 04/30/2023, 1:22 PM

## 2023-04-30 NOTE — Plan of Care (Signed)
  Problem: Clinical Measurements: Goal: Will remain free from infection Outcome: Progressing Goal: Diagnostic test results will improve Outcome: Progressing Goal: Respiratory complications will improve Outcome: Progressing Goal: Cardiovascular complication will be avoided Outcome: Progressing   Problem: Activity: Goal: Risk for activity intolerance will decrease Outcome: Progressing   

## 2023-04-30 NOTE — Progress Notes (Signed)
Pt arrived to unit on ICU bed, ambulated to floor bed w/ one assist. VSS on 2lnc. Oriented to callbell and environment. Dinner tray delivered and pt resting in bed, eating dinner with no c/o. Bed alarm activated.

## 2023-04-30 NOTE — Progress Notes (Signed)
Initial Nutrition Assessment  DOCUMENTATION CODES:   Severe malnutrition in context of chronic illness  INTERVENTION:  - Liberalize diet to Regular to avoid restricting intake given severe malnutrition.  - Ensure Plus High Protein po BID, each supplement provides 350 kcal and 20 grams of protein. - Add Multivitamin with minerals daily - Monitor weight trends.   NUTRITION DIAGNOSIS:   Severe Malnutrition related to chronic illness (COPD) as evidenced by severe fat depletion, severe muscle depletion.  GOAL:   Patient will meet greater than or equal to 90% of their needs  MONITOR:   PO intake, Supplement acceptance, Weight trends  REASON FOR ASSESSMENT:   Malnutrition Screening Tool    ASSESSMENT:   62 yo male with PMH COPD, chronic hypoxia on 2L 02, pulmonary hypertension, GERD, diastolic CHF who presented with shortness of breath.Admitted for acute COPD exacerbation.  Patient reports a UBW of 130# and weight loss over the past ~2 months due to anxiety.  Per EMR, weight appears stable the past year.   Patient resides in a boarding house and they provide his meals. He reports typically only eating 2 meals a day as if he eats too much he gets full and it makes it hard for him to breathe. However, over the past few weeks he notes the facility was cutting down on meals and offering only 1-2 per day. Also notes the food would sometimes not be very good. He will occasionally order food from [employer] to eat.  He would try to keep Ensure in his room but was told not to due to concern for rodents/bugs.   Thankfully, current appetite is good and this RD observed patient's lunch tray which was 100% consumed. Documented to be consuming 100% of all meals. Ensure also present on bedside tray and patient agreeable to receive twice daily. Encouraged patient to eat well and consume supplements during admission.   Medications reviewed and include: -  Labs reviewed:  -   NUTRITION -  FOCUSED PHYSICAL EXAM:  Flowsheet Row Most Recent Value  Orbital Region Moderate depletion  Upper Arm Region Severe depletion  Thoracic and Lumbar Region Severe depletion  Buccal Region Moderate depletion  Temple Region Severe depletion  Clavicle Bone Region Severe depletion  Clavicle and Acromion Bone Region Severe depletion  Scapular Bone Region Unable to assess  Dorsal Hand Moderate depletion  Patellar Region Severe depletion  Anterior Thigh Region Severe depletion  Posterior Calf Region Moderate depletion  Edema (RD Assessment) None  Hair Reviewed  Eyes Reviewed  Mouth Reviewed  Skin Reviewed  Nails Reviewed       Diet Order:   Diet Order             Diet Heart Room service appropriate? Yes; Fluid consistency: Thin  Diet effective now                   EDUCATION NEEDS:  Education needs have been addressed  Skin:  Skin Assessment: Reviewed RN Assessment  Last BM:  10/26  Height:  Ht Readings from Last 1 Encounters:  04/28/23 6' (1.829 m)   Weight:  Wt Readings from Last 1 Encounters:  04/28/23 59 kg   BMI:  Body mass index is 17.64 kg/m.  Estimated Nutritional Needs:  Kcal:  2050-2250 kcals Protein:  90-105 grams Fluid:  >/= 2L    Shelle Iron RD, LDN For contact information, refer to [facility].

## 2023-04-30 NOTE — Assessment & Plan Note (Signed)
-   Resides at a shelter.  They are requesting PT eval prior to returning

## 2023-05-01 DIAGNOSIS — K219 Gastro-esophageal reflux disease without esophagitis: Secondary | ICD-10-CM | POA: Diagnosis not present

## 2023-05-01 DIAGNOSIS — I2723 Pulmonary hypertension due to lung diseases and hypoxia: Secondary | ICD-10-CM

## 2023-05-01 DIAGNOSIS — Z5900 Homelessness unspecified: Secondary | ICD-10-CM

## 2023-05-01 DIAGNOSIS — J441 Chronic obstructive pulmonary disease with (acute) exacerbation: Secondary | ICD-10-CM | POA: Diagnosis not present

## 2023-05-01 DIAGNOSIS — I5032 Chronic diastolic (congestive) heart failure: Secondary | ICD-10-CM | POA: Diagnosis not present

## 2023-05-01 DIAGNOSIS — J9611 Chronic respiratory failure with hypoxia: Secondary | ICD-10-CM | POA: Diagnosis not present

## 2023-05-01 DIAGNOSIS — J449 Chronic obstructive pulmonary disease, unspecified: Secondary | ICD-10-CM

## 2023-05-01 MED ORDER — METHYLPREDNISOLONE SODIUM SUCC 125 MG IJ SOLR
60.0000 mg | Freq: Two times a day (BID) | INTRAMUSCULAR | Status: DC
  Administered 2023-05-01 – 2023-05-02 (×2): 60 mg via INTRAVENOUS
  Filled 2023-05-01 (×2): qty 2

## 2023-05-01 MED ORDER — GUAIFENESIN ER 600 MG PO TB12
1200.0000 mg | ORAL_TABLET | Freq: Two times a day (BID) | ORAL | Status: DC
  Administered 2023-05-01 – 2023-05-10 (×14): 1200 mg via ORAL
  Filled 2023-05-01 (×17): qty 2

## 2023-05-01 MED ORDER — ARFORMOTEROL TARTRATE 15 MCG/2ML IN NEBU
15.0000 ug | INHALATION_SOLUTION | Freq: Two times a day (BID) | RESPIRATORY_TRACT | Status: DC
  Administered 2023-05-01 – 2023-05-10 (×19): 15 ug via RESPIRATORY_TRACT
  Filled 2023-05-01 (×19): qty 2

## 2023-05-01 MED ORDER — BUDESONIDE 0.25 MG/2ML IN SUSP
0.2500 mg | Freq: Two times a day (BID) | RESPIRATORY_TRACT | Status: DC
  Administered 2023-05-01 – 2023-05-10 (×19): 0.25 mg via RESPIRATORY_TRACT
  Filled 2023-05-01 (×19): qty 2

## 2023-05-01 NOTE — Progress Notes (Signed)
   05/01/23 0439  BiPAP/CPAP/SIPAP  BiPAP/CPAP/SIPAP Pt Type Adult  BiPAP/CPAP/SIPAP V60  Mask Type Full face mask  Mask Size Medium  Set Rate 12 breaths/min  Respiratory Rate 24 breaths/min  IPAP 12 cmH20  EPAP 5 cmH2O  FiO2 (%) 35 %  Minute Ventilation 7.7  Leak 00  Peak Inspiratory Pressure (PIP) 12  Tidal Volume (Vt) 407  Patient Home Equipment No  Auto Titrate No  Press High Alarm 30 cmH2O  Press Low Alarm 5 cmH2O  CPAP/SIPAP surface wiped down Yes   Pt. seen on BiPAP rounds, currently sleeping and tolerating well.

## 2023-05-01 NOTE — Plan of Care (Signed)

## 2023-05-01 NOTE — Progress Notes (Signed)
Pt's O2 sat dropped to 58% on 2lnc while up to [facility]. Good pleth, accurate reading. Pt noted to have increased SOB/WOB. Increased O2 to [facility] while this writer went to get a VM but pt recovered to 91% on the [facility] within one minute before VM could be placed. Reviewed w/ pt to call for assistance with any activity so that staff can monitor and assist with his increased O2 demand with activity. VM at bedside set at 40% and will switch to that with any activity. Pt is in agreement and verbalizes understanding. MD [facility] aware.

## 2023-05-01 NOTE — TOC Progression Note (Signed)
Transition of Care [facility]) - Progression Note    Patient Details  Name: Anthony Nelson MRN: [id number] Date of Birth: [birth year]  Transition of Care [facility] CM/SW Contact  Larrie Kass, LCSW Phone Number: 05/01/2023, 2:31 PM  Clinical Narrative:    CSW spoke with pt regarding discharge planning. Pt reported his concerns with his current living situation. He stated he thought the program he is in was working to get him independent living, but they stated having concerns that he will be living on his own. Pt does not wish for CSW to contact the servant center, he reports he will call and follow up. CSW informed pt that he may be eligible for adie services through the [place]. Pt reports he has a PCP through [facility]. Pt reports current DME nebulizer, home O2, and BiPAP. Pt reports not needing transportation assistance, reported he will call someone to pick him up.  CSW spoke with Tresa Endo [facility] coordinator with the [facility] to see [facility]'s [facility] for [facility] services. She confirmed pt receives services through the [facility] and provided pt's SW contact. CSW attempted to contact pt's SW Corrie Mckusick, and left a VM requesting a return call. TOC to following.    Expected Discharge Plan and Services       Living arrangements for the past 2 months: Boarding House                                       Social Determinants of Health (SDOH) Interventions SDOH Screenings   Food Insecurity: No Food Insecurity (04/28/2023)  Recent Concern: Food Insecurity - Food Insecurity Present (04/10/2023)  Housing: Low Risk  (04/28/2023)  Recent Concern: Housing - Medium Risk (04/10/2023)  Transportation Needs: No Transportation Needs (04/29/2023)  Recent Concern: Transportation Needs - Unmet Transportation Needs (04/28/2023)  Utilities: Not At Risk (04/28/2023)  Alcohol Screen: Low Risk  (12/26/2022)  Depression (PHQ2-9): Low Risk  (01/16/2023)  Physical Activity: Insufficiently Active  (12/26/2022)  Social Connections: Moderately Isolated (12/26/2022)  Stress: No Stress Concern Present (12/26/2022)  Tobacco Use: Medium Risk (04/28/2023)    Readmission Risk Interventions     No data to display

## 2023-05-01 NOTE — Progress Notes (Signed)
   05/01/23 0100  BiPAP/CPAP/SIPAP  BiPAP/CPAP/SIPAP Pt Type Adult  BiPAP/CPAP/SIPAP V60  Mask Type Full face mask  Mask Size Medium  Set Rate 12 breaths/min  Respiratory Rate 17 breaths/min  IPAP 12 cmH20  EPAP 5 cmH2O  FiO2 (%) 35 %  Minute Ventilation 9.8  Leak 00  Peak Inspiratory Pressure (PIP) 12  Tidal Volume (Vt) 425  Patient Home Equipment No  Auto Titrate No  Press High Alarm 30 cmH2O  Press Low Alarm 5 cmH2O  CPAP/SIPAP surface wiped down Yes   Pt. placed on BiPAP V-60 for H/S at this time, tolerating well, made aware to notify if needed/

## 2023-05-01 NOTE — Evaluation (Signed)
Physical Therapy Evaluation Patient Details Name: Anthony Nelson MRN: [id number] DOB: 27-Jan-1961 Today's Date: 05/01/2023    SATURATION QUALIFICATIONS: (This note is used to comply with regulatory documentation for home oxygen)  Patient Saturations on 2L at Rest = 93%  Patient Saturations on 2L with activity= 86%  Patient Saturations on 4 Liters of oxygen while Ambulating = 87%     History of Present Illness  62 yo male admitted with COPD exac. Hx of COPD-2L baseline, HF, pulm HTN  Clinical Impression  On eval, pt was Supv-Mod Ind with mobility. He walked ~75 feet with a rollator. Mobility limited by dyspnea and drop in O2 sats with minimal activity. Will plan to follow and progress activity as tolerated.         If plan is discharge home, recommend the following: Assist for transportation;Help with stairs or ramp for entrance   Can travel by private vehicle        Equipment Recommendations None recommended by PT  Recommendations for Other Services       Functional Status Assessment Patient has had a recent decline in their functional status and demonstrates the ability to make significant improvements in function in a reasonable and predictable amount of time.     Precautions / Restrictions Precautions Precaution Comments: monitor O2 Restrictions Weight Bearing Restrictions: No      Mobility  Bed Mobility Overal bed mobility: Modified Independent             General bed mobility comments: Rest break EOB. O2 86% on 2L-had to bump up to 4L to achieve 90% before attempting ambulation    Transfers Overall transfer level: Modified independent Equipment used: Rollator (4 wheels)                    Ambulation/Gait Ambulation/Gait assistance: Supervision Gait Distance (Feet): 75 Feet Assistive device: Rollator (4 wheels) Gait Pattern/deviations: Step-through pattern, Decreased stride length       General Gait Details: Effortful gait. Slow gait  speed. Dyspnea 3/4 towards end of distance. O2 87% on 4L  Stairs            Wheelchair Mobility     Tilt Bed    Modified Rankin (Stroke Patients Only)       Balance Overall balance assessment: Mild deficits observed, not formally tested                                           Pertinent Vitals/Pain Pain Assessment Pain Assessment: No/denies pain    Home Living Family/patient expects to be discharged to:: Shelter/Homeless                 Home Equipment: Rollator (4 wheels) Additional Comments: Patient's belongings are currently in storage due to a bad separation. Patient is at the [facility] through the [place]. Bathroom is shared    Prior Function Prior Level of Function : Independent/Modified Independent             Mobility Comments: independent with extra time, has a RW, but is old and his fathers ADLs Comments: independent with extra time, frequent breaks due to SOB     Extremity/Trunk Assessment   Upper Extremity Assessment Upper Extremity Assessment: Overall WFL for tasks assessed    Lower Extremity Assessment Lower Extremity Assessment: Generalized weakness    Cervical / Trunk Assessment Cervical / Trunk Assessment:  Normal  Communication   Communication Communication: No apparent difficulties  Cognition Arousal: Alert Behavior During Therapy: WFL for tasks assessed/performed Overall Cognitive Status: Within Functional Limits for tasks assessed                                 General Comments: Alert and appropriate;pleasant        General Comments      Exercises     Assessment/Plan    PT Assessment Patient needs continued PT services  PT Problem List Decreased activity tolerance;Decreased balance;Decreased mobility       PT Treatment Interventions Gait training;Functional mobility training;Therapeutic activities;Therapeutic exercise;Patient/family education    PT Goals (Current goals can  be found in the Care Plan section)  Acute Rehab PT Goals Patient Stated Goal: feel better PT Goal Formulation: With patient Time For Goal Achievement: 05/15/23 Potential to Achieve Goals: Good    Frequency Min 1X/week     Co-evaluation               AM-PAC PT "6 Clicks" Mobility  Outcome Measure Help needed turning from your back to your side while in a flat bed without using bedrails?: None Help needed moving from lying on your back to sitting on the side of a flat bed without using bedrails?: None Help needed moving to and from a bed to a chair (including a wheelchair)?: None Help needed standing up from a chair using your arms (e.g., wheelchair or bedside chair)?: None Help needed to walk in hospital room?: A Little Help needed climbing 3-5 steps with a railing? : A Little 6 Click Score: 22    End of Session Equipment Utilized During Treatment: Oxygen Activity Tolerance:  (limited by dyspnea, drop in O2 sats) Patient left: in chair;with call bell/phone within reach   PT Visit Diagnosis: Difficulty in walking, not elsewhere classified (R26.2)    Time: 9629-5284 PT Time Calculation (min) (ACUTE ONLY): 43 min   Charges:   PT Evaluation $PT Eval Low Complexity: 1 Low PT Treatments $Gait Training: 23-37 mins PT General Charges $$ ACUTE PT VISIT: 1 Visit           Faye Ramsay, PT Acute Rehabilitation  Office: [phone]

## 2023-05-01 NOTE — Progress Notes (Addendum)
PROGRESS NOTE    [facility]  UEA:[id number] DOB: 03-04-1961 DOA: 04/28/2023 PCP: Storm Frisk, MD   Brief Narrative:  Anthony Nelson is a 62 yo male with PMH COPD, chronic hypoxia on 2L 02, pulmonary hypertension, GERD, diastolic CHF and other comorbidities who presented with shortness of breath. Symptoms have been ongoing for several days and he began having nonproductive cough and sensation of difficulty clearing sputum but he feels more short winded. Slightly hypoxic on admission, 89% and placed on 3 L.  VBG notable for hypercarbia and pH 7.28.  He was placed on BiPAP and admitted for further workup and monitoring.  He is slowly improving however he continues to desaturate.  Will need an amatory home O2 screen prior to discharge and repeat chest x-ray in a.m.  Assessment and Plan:  COPD with acute exacerbation (HCC) -Mainly dyspnea and tight breath sounds; nonproductive cough -Desaturates and when working with PT the patient dropped to 86% just getting to the edge of the bed. Dropped to 87% on 4lnc with ambulation. -Added guaifenesin 1200 ohms p.o. twice daily and continue spirometry and flutter valve -Continue with breathing treatments with DuoNeb 3 mL 3 times daily and add Brovana 15 mcg neb twice daily and budesonide 0.25 mg p.o. twice daily; also has albuterol 2.5 mg Neb every 2 as needed for wheezing -Continue with IV steroids but increase from IV 60 daily to IV 60 mg every 12 -Continue BiPAP as needed and at night; may need to discuss with pulmonary about getting the patient qualified for trilogy BiPAP and discuss with Pulmonary -Continuous pulse oximetry and maintain O2 saturations greater than 90% -Continue supplemental oxygen via nasal cannula and wean to home regimen -Repeat chest x-ray in the a.m. and will need ambulatory home O2 screen prior to discharge   Homeless -Resides at a shelter.  They are requesting PT eval prior to returning and pending -TOC to be involved     GERD/GI Prophylaxis -Continue Famotidine 20 mg po BID   Hyperlipidemia -Continue Rosuvastatin 40 mg po Daily    Chronic respiratory failure with hypoxia and hypercapnia (HCC) -Chronic 2L at home SpO2: 94 % O2 Flow Rate (L/min): 2 L/min FiO2 (%): 28 % -Placed on 3L initially then was placed on BiPAP and now stable off BiPAP but we're using it at night. He is not on NIV at home but may need it    Chronic Diastolic Heart Failure (HCC -No evidence of acute decompensation of heart failure -Last TTE 04/18/2022 with LVEF 50-55%, LV with no regional wall trauma maladies, RV mildly enlarged, trivial MR.   .Strict I's and O's and Daily Weights;  Intake/Output Summary (Last 24 hours) at 05/01/2023 1302 Last data filed at 05/01/2023 1000 Gross per 24 hour  Intake 360 ml  Output 1200 ml  Net -840 ml  -Only on lasix PRN at home; will resume if needed  Macrocytic Anemia -Hgb/Hct Trend: Recent Labs  Lab 04/09/23 1429 04/09/23 2246 04/10/23 0607 04/28/23 1505 04/28/23 1506 04/29/23 0633 04/30/23 0700  HGB 12.0* 11.8* 11.5* 12.0* 13.6 10.9* 10.5*  HCT 40.4 39.7 38.3* 40.7 40.0 36.2* 33.9*  MCV 104.4* 103.9* 103.2* 106.3*  --  104.6* 102.7*  -Check Anemia Panel in  the AM -Continue to Monitor for S/Sx of Bleeding; No overt bleeding noted -Repeat CBC in the AM   Thrombocytopenia -Platelet Count Trend: Recent Labs  Lab 04/09/23 1429 04/09/23 2246 04/10/23 0607 04/28/23 1505 04/29/23 0633 04/30/23 0700  PLT 149* 139* 144* 159  141* 148*  -Continue to Monitor for S/Sx of Bleeding; No overt bleeding noted  -Repeat CBC in the AM   Severe Protein Calorie Malnutrition Nutrition Status: Nutrition Problem: Severe Malnutrition Etiology: chronic illness (COPD) Signs/Symptoms: severe fat depletion, severe muscle depletion Interventions: Refer to RD note for recommendations, Ensure Enlive (each supplement provides 350kcal and 20 grams of protein), MVI, Liberalize Diet     DVT  prophylaxis: enoxaparin (LOVENOX) injection 40 mg Start: 04/28/23 2200 SCDs Start: 04/28/23 1706    Code Status: Full Code Family Communication: No family currently at bedside  Disposition Plan:  Level of care: Progressive Status is: Inpatient Remains inpatient appropriate because: Continues to desaturate upon exertion and will need further clinical improvement prior to discharge and will need to find a safe discharge disposition for the patient   Consultants:  As delineated as above  Procedures:  None  Antimicrobials:  Anti-infectives (From admission, onward)    Start     Dose/Rate Route Frequency Ordered Stop   04/28/23 1545  cefTRIAXone (ROCEPHIN) 1 g in sodium chloride 0.9 % 100 mL IVPB        1 g 200 mL/hr over 30 Minutes Intravenous  Once 04/28/23 1532 04/28/23 1606   04/28/23 1545  doxycycline (VIBRA-TABS) tablet 100 mg        100 mg Oral  Once 04/28/23 1532 04/28/23 1535       Subjective: Seen and examined at bedside and thinks that he is getting close to his baseline.  No nausea vomiting but desaturated with exertion to the bedside.  No chest pain or lightheadedness or dizziness.  No other concerns or complaints at this time.  Objective: Vitals:   04/30/23 2156 04/30/23 2355 05/01/23 0432 05/01/23 0750  BP:  101/68 95/62   Pulse:  84 77   Resp:  18 20 17   Temp:  98.1 F (36.7 C)    TempSrc:  Oral    SpO2: 93% 98% 96% 94%  Weight:      Height:        Intake/Output Summary (Last 24 hours) at 05/01/2023 1319 Last data filed at 05/01/2023 1000 Gross per 24 hour  Intake 360 ml  Output 1200 ml  Net -840 ml   Filed Weights   04/28/23 1440  Weight: 59 kg   Examination: Physical Exam:  Constitutional: Thin African-American male no acute distress appears a little frustrated Respiratory: Diminished to auscultation bilaterally with some coarse breath sounds, has some minimal wheezing, rales, rhonchi or crackles. Normal respiratory effort and patient is not  tachypenic and he is wearing supplemental oxygen via nasal cannula Cardiovascular: RRR, no murmurs / rubs / gallops. S1 and S2 auscultated. No extremity edema.  Abdomen: Soft, non-tender, non-distended. Bowel sounds positive.  GU: Deferred. Musculoskeletal: No clubbing / cyanosis of digits/nails. No joint deformity upper and lower extremities. Skin: No rashes, lesions, ulcers on limited skin evaluation. No induration; Warm and dry.  Neurologic: CN 2-12 grossly intact with no focal deficits. Romberg sign and cerebellar reflexes not assessed.  Psychiatric: Normal judgment and insight. Alert and oriented x 3. Normal mood and appropriate affect.   Data Reviewed: I have personally reviewed following labs and imaging studies  CBC: Recent Labs  Lab 04/28/23 1505 04/28/23 1506 04/29/23 0633 04/30/23 0700  WBC 7.4  --  5.5 6.8  NEUTROABS 6.2  --   --   --   HGB 12.0* 13.6 10.9* 10.5*  HCT 40.7 40.0 36.2* 33.9*  MCV 106.3*  --  104.6* 102.7*  PLT 159  --  141* 148*   Basic Metabolic Panel: Recent Labs  Lab 04/28/23 1505 04/28/23 1506 04/29/23 0345 04/30/23 0700  NA 141 140 139 142  K 3.1* 3.6 4.5 3.8  CL 91* 88* 94* 93*  CO2 41*  --  33* 40*  GLUCOSE 144* 144* 97 88  BUN 16 21 13 18   CREATININE 0.49* 0.80 0.51* 0.88  CALCIUM 8.9  --  8.7* 8.7*  MG 1.8  --   --   --    GFR: Estimated Creatinine Clearance: 71.7 mL/min (by C-G formula based on SCr of 0.88 mg/dL). Liver Function Tests: Recent Labs  Lab 04/28/23 1505  AST 18  ALT 19  ALKPHOS 48  BILITOT 0.7  PROT 7.1  ALBUMIN 3.6   No results for input(s): "LIPASE", "AMYLASE" in the last 168 hours. No results for input(s): "AMMONIA" in the last 168 hours. Coagulation Profile: No results for input(s): "INR", "PROTIME" in the last 168 hours. Cardiac Enzymes: No results for input(s): "CKTOTAL", "CKMB", "CKMBINDEX", "TROPONINI" in the last 168 hours. BNP (last 3 results) No results for input(s): "PROBNP" in the last 8760  hours. HbA1C: No results for input(s): "HGBA1C" in the last 72 hours. CBG: No results for input(s): "GLUCAP" in the last 168 hours. Lipid Profile: No results for input(s): "CHOL", "HDL", "LDLCALC", "TRIG", "CHOLHDL", "LDLDIRECT" in the last 72 hours. Thyroid Function Tests: No results for input(s): "TSH", "T4TOTAL", "FREET4", "T3FREE", "THYROIDAB" in the last 72 hours. Anemia Panel: Recent Labs    04/29/23 1442  VITAMINB12 650  FOLATE 8.8   Sepsis Labs: No results for input(s): "PROCALCITON", "LATICACIDVEN" in the last 168 hours.  Recent Results (from the past 240 hour(s))  Resp panel by RT-PCR (RSV, Flu A&B, Covid) Anterior Nasal Swab     Status: None   Collection Time: 04/28/23  3:05 PM   Specimen: Anterior Nasal Swab  Result Value Ref Range Status   SARS Coronavirus 2 by RT PCR NEGATIVE NEGATIVE Final    Comment: (NOTE) SARS-CoV-2 target nucleic acids are NOT DETECTED.  The SARS-CoV-2 RNA is generally detectable in upper respiratory specimens during the acute phase of infection. The lowest concentration of SARS-CoV-2 viral copies this assay can detect is 138 copies/mL. A negative result does not preclude SARS-Cov-2 infection and should not be used as the sole basis for treatment or other patient management decisions. A negative result may occur with  improper specimen collection/handling, submission of specimen other than nasopharyngeal swab, presence of viral mutation(s) within the areas targeted by this assay, and inadequate number of viral copies(<138 copies/mL). A negative result must be combined with clinical observations, patient history, and epidemiological information. The expected result is Negative.  Fact Sheet for Patients:  BloggerCourse.com  Fact Sheet for Healthcare Providers:  SeriousBroker.it  This test is no t yet approved or cleared by the [place] FDA and  has been authorized for detection  and/or diagnosis of SARS-CoV-2 by FDA under an Emergency Use Authorization (EUA). This EUA will remain  in effect (meaning this test can be used) for the duration of the COVID-19 declaration under Section 564(b)(1) of the Act, 21 U.S.C.section 360bbb-3(b)(1), unless the authorization is terminated  or revoked sooner.       Influenza A by PCR NEGATIVE NEGATIVE Final   Influenza B by PCR NEGATIVE NEGATIVE Final    Comment: (NOTE) The Xpert Xpress SARS-CoV-2/FLU/RSV plus assay is intended as an aid in the diagnosis of influenza from Nasopharyngeal swab specimens and should not  be used as a sole basis for treatment. Nasal washings and aspirates are unacceptable for Xpert Xpress SARS-CoV-2/FLU/RSV testing.  Fact Sheet for Patients: BloggerCourse.com  Fact Sheet for Healthcare Providers: SeriousBroker.it  This test is not yet approved or cleared by the [place] FDA and has been authorized for detection and/or diagnosis of SARS-CoV-2 by FDA under an Emergency Use Authorization (EUA). This EUA will remain in effect (meaning this test can be used) for the duration of the COVID-19 declaration under Section 564(b)(1) of the Act, 21 U.S.C. section 360bbb-3(b)(1), unless the authorization is terminated or revoked.     Resp Syncytial Virus by PCR NEGATIVE NEGATIVE Final    Comment: (NOTE) Fact Sheet for Patients: BloggerCourse.com  Fact Sheet for Healthcare Providers: SeriousBroker.it  This test is not yet approved or cleared by the [place] FDA and has been authorized for detection and/or diagnosis of SARS-CoV-2 by FDA under an Emergency Use Authorization (EUA). This EUA will remain in effect (meaning this test can be used) for the duration of the COVID-19 declaration under Section 564(b)(1) of the Act, 21 U.S.C. section 360bbb-3(b)(1), unless the authorization is terminated  or revoked.  Performed at [facility], [address] [place], [place] [address]   Respiratory (~20 pathogens) panel by PCR     Status: None   Collection Time: 04/28/23  3:05 PM   Specimen: Nasopharyngeal Swab; Respiratory  Result Value Ref Range Status   Adenovirus NOT DETECTED NOT DETECTED Final   Coronavirus 229E NOT DETECTED NOT DETECTED Final    Comment: (NOTE) The Coronavirus on the Respiratory Panel, DOES NOT test for the novel  Coronavirus (2019 nCoV)    Coronavirus HKU1 NOT DETECTED NOT DETECTED Final   Coronavirus NL63 NOT DETECTED NOT DETECTED Final   Coronavirus OC43 NOT DETECTED NOT DETECTED Final   Metapneumovirus NOT DETECTED NOT DETECTED Final   Rhinovirus / Enterovirus NOT DETECTED NOT DETECTED Final   Influenza A NOT DETECTED NOT DETECTED Final   Influenza B NOT DETECTED NOT DETECTED Final   Parainfluenza Virus 1 NOT DETECTED NOT DETECTED Final   Parainfluenza Virus 2 NOT DETECTED NOT DETECTED Final   Parainfluenza Virus 3 NOT DETECTED NOT DETECTED Final   Parainfluenza Virus 4 NOT DETECTED NOT DETECTED Final   Respiratory Syncytial Virus NOT DETECTED NOT DETECTED Final   Bordetella pertussis NOT DETECTED NOT DETECTED Final   Bordetella Parapertussis NOT DETECTED NOT DETECTED Final   Chlamydophila pneumoniae NOT DETECTED NOT DETECTED Final   Mycoplasma pneumoniae NOT DETECTED NOT DETECTED Final    Comment: Performed at [facility], [address], [place], [place] [address]  MRSA Next Gen by PCR, Nasal     Status: None   Collection Time: 04/28/23  8:06 PM   Specimen: Nasal Mucosa; Nasal Swab  Result Value Ref Range Status   MRSA by PCR Next Gen NOT DETECTED NOT DETECTED Final    Comment: (NOTE) The GeneXpert MRSA Assay (FDA approved for NASAL specimens only), is one component of a comprehensive MRSA colonization surveillance program. It is not intended to diagnose MRSA infection nor to guide or monitor treatment for MRSA  infections. Test performance is not FDA approved in patients less than 79 years old. Performed at [facility], [address], [place], [place] [address]     Radiology Studies: No results found.  Scheduled Meds:  arformoterol  15 mcg Nebulization BID   aspirin EC  81 mg Oral Daily   budesonide (PULMICORT) nebulizer solution  0.25 mg Nebulization BID   Chlorhexidine Gluconate Cloth  6 each Topical Daily   enoxaparin (LOVENOX) injection  40 mg Subcutaneous Q24H   famotidine  20 mg Oral BID   feeding supplement  237 mL Oral BID BM   guaiFENesin  1,200 mg Oral BID   ipratropium-albuterol  3 mL Nebulization TID   methylPREDNISolone (SOLU-MEDROL) injection  60 mg Intravenous Q12H   mouth rinse  15 mL Mouth Rinse 4 times per day   rosuvastatin  40 mg Oral Daily   Continuous Infusions:   LOS: 3 days   Marguerita Merles, DO [facility] Available via Epic secure chat 7am-7pm After these hours, please refer to coverage provider listed on [email or web address] 05/01/2023, 1:19 PM

## 2023-05-02 ENCOUNTER — Telehealth: Payer: Self-pay | Admitting: Pulmonary Disease

## 2023-05-02 ENCOUNTER — Inpatient Hospital Stay (HOSPITAL_COMMUNITY): Payer: No Typology Code available for payment source

## 2023-05-02 DIAGNOSIS — E782 Mixed hyperlipidemia: Secondary | ICD-10-CM

## 2023-05-02 DIAGNOSIS — J441 Chronic obstructive pulmonary disease with (acute) exacerbation: Secondary | ICD-10-CM | POA: Diagnosis not present

## 2023-05-02 DIAGNOSIS — I5032 Chronic diastolic (congestive) heart failure: Secondary | ICD-10-CM | POA: Diagnosis not present

## 2023-05-02 DIAGNOSIS — K219 Gastro-esophageal reflux disease without esophagitis: Secondary | ICD-10-CM | POA: Diagnosis not present

## 2023-05-02 DIAGNOSIS — E43 Unspecified severe protein-calorie malnutrition: Secondary | ICD-10-CM

## 2023-05-02 DIAGNOSIS — J9611 Chronic respiratory failure with hypoxia: Secondary | ICD-10-CM | POA: Diagnosis not present

## 2023-05-02 LAB — COMPREHENSIVE METABOLIC PANEL
ALT: 18 U/L (ref 0–44)
AST: 14 U/L — ABNORMAL LOW (ref 15–41)
Albumin: 3.4 g/dL — ABNORMAL LOW (ref 3.5–5.0)
Alkaline Phosphatase: 45 U/L (ref 38–126)
Anion gap: 9 (ref 5–15)
BUN: 17 mg/dL (ref 8–23)
CO2: 37 mmol/L — ABNORMAL HIGH (ref 22–32)
Calcium: 8.7 mg/dL — ABNORMAL LOW (ref 8.9–10.3)
Chloride: 89 mmol/L — ABNORMAL LOW (ref 98–111)
Creatinine, Ser: 0.5 mg/dL — ABNORMAL LOW (ref 0.61–1.24)
GFR, Estimated: >60 mL/min (ref >60)
Glucose, Bld: 132 mg/dL — ABNORMAL HIGH (ref 70–99)
Potassium: 4.2 mmol/L (ref 3.5–5.1)
Sodium: 135 mmol/L (ref 135–145)
Total Bilirubin: 0.5 mg/dL (ref 0.3–1.2)
Total Protein: 6.7 g/dL (ref 6.5–8.1)

## 2023-05-02 LAB — CBC WITH DIFFERENTIAL/PLATELET
Abs Immature Granulocytes: 0.03 10*3/uL (ref 0.00–0.07)
Basophils Absolute: 0 10*3/uL (ref 0.0–0.1)
Basophils Relative: 0 %
Eosinophils Absolute: 0 10*3/uL (ref 0.0–0.5)
Eosinophils Relative: 0 %
HCT: 36.8 % — ABNORMAL LOW (ref 39.0–52.0)
Hemoglobin: 11.5 g/dL — ABNORMAL LOW (ref 13.0–17.0)
Immature Granulocytes: 0 %
Lymphocytes Relative: 4 %
Lymphs Abs: 0.3 10*3/uL — ABNORMAL LOW (ref 0.7–4.0)
MCH: 31.8 pg (ref 26.0–34.0)
MCHC: 31.3 g/dL (ref 30.0–36.0)
MCV: 101.7 fL — ABNORMAL HIGH (ref 80.0–100.0)
Monocytes Absolute: 0.1 10*3/uL (ref 0.1–1.0)
Monocytes Relative: 1 %
Neutro Abs: 7.7 10*3/uL (ref 1.7–7.7)
Neutrophils Relative %: 95 %
Platelets: 159 10*3/uL (ref 150–400)
RBC: 3.62 MIL/uL — ABNORMAL LOW (ref 4.22–5.81)
RDW: 12.2 % (ref 11.5–15.5)
WBC: 8.1 10*3/uL (ref 4.0–10.5)
nRBC: 0 % (ref 0.0–0.2)

## 2023-05-02 LAB — MAGNESIUM: Magnesium: 2.1 mg/dL (ref 1.7–2.4)

## 2023-05-02 LAB — PHOSPHORUS: Phosphorus: 4.2 mg/dL (ref 2.5–4.6)

## 2023-05-02 MED ORDER — METHYLPREDNISOLONE SODIUM SUCC 40 MG IJ SOLR
40.0000 mg | Freq: Two times a day (BID) | INTRAMUSCULAR | Status: DC
  Administered 2023-05-02 – 2023-05-04 (×4): 40 mg via INTRAVENOUS
  Filled 2023-05-02 (×5): qty 1

## 2023-05-02 NOTE — Telephone Encounter (Signed)
Scheduled HFU with Dr Isaiah Serge as he is the only provider to have any openings for the remainder of the year. Reminder mailed. Nothing further needed.

## 2023-05-02 NOTE — Progress Notes (Signed)
Physical Therapy Treatment Patient Details Name: Anthony Nelson MRN: [id number] DOB: Jul 05, 1960 Today's Date: 05/02/2023   History of Present Illness 62 yo male admitted with COPD exac. Hx of COPD-2L baseline, HF, pulm HTN    PT Comments  Pt. progressed well with gait. Pt. required extra time sitting on EOB and required cues for correct breathing techniques. During sitting, portable oxygen tank was increased from 2L to 4L O2 to reach 90% O2. Cues were required to focus on forceful exhalation during ambulating when pt. would experience dyspnea and decreased O2. Patient demonstrated knowledge of breathing techniques at end of session and mentioned that he takes things slow when moving from laying down to sitting. He mentioned that sometimes he gets a "good" breath and he will be fine for a couple of hours.   If plan is discharge home, recommend the following: Assist for transportation;Help with stairs or ramp for entrance;A little help with bathing/dressing/bathroom   Can travel by private vehicle        Equipment Recommendations  None recommended by PT    Recommendations for Other Services       Precautions / Restrictions Precautions Precautions: Fall Precaution Comments: monitor O2 Restrictions Weight Bearing Restrictions: No     Mobility  Bed Mobility Overal bed mobility: Modified Independent Bed Mobility: Supine to Sit     Supine to sit: Modified independent (Device/Increase time) Sit to supine: Modified independent (Device/Increase time)   General bed mobility comments: Rest break EOB. O2 84% on 2L-had to bump up to 4L to achieve 90% before attempting ambulation    Transfers Overall transfer level: Modified independent Equipment used: Rolling walker (2 wheels) Transfers: Sit to/from Stand Sit to Stand: Modified independent (Device/Increase time)           General transfer comment: Patient demonstrated good sit to stand technicue, after taking a rest break.     Ambulation/Gait Ambulation/Gait assistance: Supervision Gait Distance (Feet): 100 Feet Assistive device: Rolling walker (2 wheels) Gait Pattern/deviations: Step-through pattern, Decreased stride length Gait velocity: Decreased     General Gait Details: Highest HR: 111, Lowest O2: 82. Cues to forcefully exhale during periods of dyspnea and low O2. Patient ambulated on 4L O2   Stairs             Wheelchair Mobility     Tilt Bed    Modified Rankin (Stroke Patients Only)       Balance                                            Cognition Arousal: Alert Behavior During Therapy: WFL for tasks assessed/performed Overall Cognitive Status: Within Functional Limits for tasks assessed                                 General Comments: Alert and appropriate;pleasant        Exercises      General Comments        Pertinent Vitals/Pain Pain Assessment Pain Assessment: No/denies pain    Home Living                          Prior Function            PT Goals (current goals can now be found in the care  plan section) Acute Rehab PT Goals Patient Stated Goal: feel better PT Goal Formulation: With patient Time For Goal Achievement: 05/15/23 Progress towards PT goals: Progressing toward goals    Frequency    Min 1X/week      PT Plan      Co-evaluation              AM-PAC PT "6 Clicks" Mobility   Outcome Measure  Help needed turning from your back to your side while in a flat bed without using bedrails?: None Help needed moving from lying on your back to sitting on the side of a flat bed without using bedrails?: None Help needed moving to and from a bed to a chair (including a wheelchair)?: None Help needed standing up from a chair using your arms (e.g., wheelchair or bedside chair)?: None Help needed to walk in hospital room?: A Little Help needed climbing 3-5 steps with a railing? : A Little 6 Click  Score: 22    End of Session Equipment Utilized During Treatment: Oxygen;Gait belt Activity Tolerance: Patient tolerated treatment well Patient left: in bed;with call bell/phone within reach;with bed alarm set Nurse Communication: Mobility status PT Visit Diagnosis: Difficulty in walking, not elsewhere classified (R26.2)     Time: 7829-5621 PT Time Calculation (min) (ACUTE ONLY): 25 min  Charges:    $Gait Training: 8-22 mins $Therapeutic Activity: 8-22 mins PT General Charges $$ ACUTE PT VISIT: 1 Visit                      Lazaro Arms, SPTA 05/02/2023, 1:34 PM

## 2023-05-02 NOTE — Progress Notes (Signed)
PROGRESS NOTE    HRISTO VANDEKAMP  NWG:[id number] DOB: 11-Aug-1960 DOA: 04/28/2023 PCP: Storm Frisk, MD   Brief Narrative:  Mr. Flight is a 62 yo male with PMH COPD, chronic hypoxia on 2L 02, pulmonary hypertension, GERD, diastolic CHF and other comorbidities who presented with shortness of breath. Symptoms have been ongoing for several days and he began having nonproductive cough and sensation of difficulty clearing sputum but he feels more short winded. Slightly hypoxic on admission, 89% and placed on 3 L.  VBG notable for hypercarbia and pH 7.28.  He was placed on BiPAP and admitted for further workup and monitoring.  He is slowly improving however he continues to desaturate but continues to improve daily.  Will watch him another night and continue IV steroids and taper down to 40 twice daily.  Repeat chest x-ray this a.m. showed no active disease and hematuria on O2 screen done showed that he needs at least 4 L while ambulating.  Anticipating discharging in the next 24 hours given that he is getting close to being discharged.  Assessment and Plan:  COPD with Acute Exacerbation (HCC) -Mainly dyspnea and tight breath sounds; nonproductive cough -Desaturates and when working with PT the patient dropped to 86% just getting to the edge of the bed. Dropped to 87% on 4lnc with ambulation. -Added guaifenesin 1200 ohms p.o. twice daily and continue spirometry and flutter valve -Continue with breathing treatments with DuoNeb 3 mL 3 times daily and add Brovana 15 mcg neb twice daily and budesonide 0.25 mg p.o. twice daily; also has albuterol 2.5 mg Neb every 2 as needed for wheezing -Continued with IV Steroids but increased from IV 60 daily to IV 60 mg every 12 and will go back down to 40 mg BID today and then change to po Prednisone Taper for D/C  -Continue BiPAP as needed and at night; may need to discuss with pulmonary about getting the patient qualified for trilogy BiPAP and discuss with  Pulmonary -Continuous pulse oximetry and maintain O2 saturations greater than 90% -Continue supplemental oxygen via nasal cannula and wean to home regimen -Repeat chest x-ray in the a.m. this a.m. showed no active disease with amatory home O2 screen done and he did desaturate on 2 L to 84% but came back up to 90% on 4 Liters    Homeless -Resides at a shelter.  They are requesting PT eval prior to returning and PT recommending no Follow up -TOC to be involved; Plan is for the patient to [facility]   GERD/GI Prophylaxis -Continue Famotidine 20 mg po BID   Hyperlipidemia -Continue Rosuvastatin 40 mg po Daily    Chronic Respiratory Failure with Hypoxia and Hypercapnia (HCC) -Chronic 2L at home SpO2: 94 % O2 Flow Rate (L/min): 2 L/min FiO2 (%): 35 % -Placed on 3L initially then was placed on BiPAP and now stable off BiPAP but we're using it at night.  He states that he has a BiPAP at home and will continue this at discharge -Patient desaturated on ambulatory home O2 screen and had to be placed on 4 L while ambulating he may need to increase his oxygen while ambulating at discharge   Chronic Diastolic Heart Failure (HCC -No evidence of acute decompensation of heart failure -Last TTE 04/18/2022 with LVEF 50-55%, LV with no regional wall trauma maladies, RV mildly enlarged, trivial MR.   .Strict I's and O's and Daily Weights;  Intake/Output Summary (Last 24 hours) at 05/02/2023 1609 Last data  filed at 05/02/2023 0920 Gross per 24 hour  Intake 240 ml  Output 300 ml  Net -60 ml  -Only on lasix PRN at home; will resume if needed -Continue to monitor for signs or symptoms of volume overload  Macrocytic Anemia -Hgb/Hct Trend: Recent Labs  Lab 04/09/23 2246 04/10/23 0607 04/28/23 1505 04/28/23 1506 04/29/23 0633 04/30/23 0700 05/02/23 0407  HGB 11.8* 11.5* 12.0* 13.6 10.9* 10.5* 11.5*  HCT 39.7 38.3* 40.7 40.0 36.2* 33.9* 36.8*  MCV 103.9* 103.2* 106.3*  --   104.6* 102.7* 101.7*  -Check Anemia Panel in  the AM -Continue to Monitor for S/Sx of Bleeding; No overt bleeding noted -Repeat CBC in the AM   Thrombocytopenia -Platelet Count Trend: Recent Labs  Lab 04/09/23 1429 04/09/23 2246 04/10/23 0607 04/28/23 1505 04/29/23 0633 04/30/23 0700 05/02/23 0407  PLT 149* 139* 144* 159 141* 148* 159  -Continue to Monitor for S/Sx of Bleeding; No overt bleeding noted  -Repeat CBC in the AM   Severe Protein Calorie Malnutrition Nutrition Status: Nutrition Problem: Severe Malnutrition Etiology: chronic illness (COPD) Signs/Symptoms: severe fat depletion, severe muscle depletion Interventions: Refer to RD note for recommendations, Ensure Enlive (each supplement provides 350kcal and 20 grams of protein), MVI, Liberalize Diet    DVT prophylaxis: enoxaparin (LOVENOX) injection 40 mg Start: 04/28/23 2200 SCDs Start: 04/28/23 1706    Code Status: Full Code Family Communication: No family currently at bedside  Disposition Plan:  Level of care: Progressive Status is: Inpatient Remains inpatient appropriate because: Needs further clinical improvement and anticipating D/C Home in the next 24-48 hours   Consultants:  None  Procedures:  As delineated as above  Antimicrobials:  Anti-infectives (From admission, onward)    Start     Dose/Rate Route Frequency Ordered Stop   04/28/23 1545  cefTRIAXone (ROCEPHIN) 1 g in sodium chloride 0.9 % 100 mL IVPB        1 g 200 mL/hr over 30 Minutes Intravenous  Once 04/28/23 1532 04/28/23 1606   04/28/23 1545  doxycycline (VIBRA-TABS) tablet 100 mg        100 mg Oral  Once 04/28/23 1532 04/28/23 1535       Subjective: Examined at bedside and was feeling little bit better.  No nausea or vomiting.  He and his.  Feels okay.  States that he is getting close to being at his baseline.  No other concerns or complaints at this time.  Objective: Vitals:   05/02/23 0334 05/02/23 0453 05/02/23 0826 05/02/23  1355  BP:  107/87  98/71  Pulse:  75  89  Resp: 14 18  20   Temp:    97.9 F (36.6 C)  TempSrc:    Oral  SpO2:  100% 96% 94%  Weight:      Height:        Intake/Output Summary (Last 24 hours) at 05/02/2023 1610 Last data filed at 05/02/2023 0920 Gross per 24 hour  Intake 240 ml  Output 300 ml  Net -60 ml   Filed Weights   04/28/23 1440  Weight: 59 kg   Examination: Physical Exam:  Constitutional: Thin African-American male in no acute distress Respiratory: Diminished to auscultation bilaterally with some coarse breath sounds and slight wheezing but no appreciable rales, rhonchi or crackles.  Has a normal respiratory effort and is not tachypneic but he is wearing supplemental oxygen via nasal cannula, Cardiovascular: RRR, no murmurs / rubs / gallops. S1 and S2 auscultated. No extremity edema.   Abdomen: Soft, non-tender,  non-distended. Bowel sounds positive.  GU: Deferred. Musculoskeletal: No clubbing / cyanosis of digits/nails. No joint deformity upper and lower extremities. Skin: No rashes, lesions, ulcers on a limited skin evaluation. No induration; Warm and dry.  Neurologic: CN 2-12 grossly intact with no focal deficits. Romberg sign and cerebellar reflexes not assessed.  Psychiatric: Normal judgment and insight. Alert and oriented x 3. Normal mood and appropriate affect.   Data Reviewed: I have personally reviewed following labs and imaging studies  CBC: Recent Labs  Lab 04/28/23 1505 04/28/23 1506 04/29/23 0633 04/30/23 0700 05/02/23 0407  WBC 7.4  --  5.5 6.8 8.1  NEUTROABS 6.2  --   --   --  7.7  HGB 12.0* 13.6 10.9* 10.5* 11.5*  HCT 40.7 40.0 36.2* 33.9* 36.8*  MCV 106.3*  --  104.6* 102.7* 101.7*  PLT 159  --  141* 148* 159   Basic Metabolic Panel: Recent Labs  Lab 04/28/23 1505 04/28/23 1506 04/29/23 0345 04/30/23 0700 05/02/23 0407  NA 141 140 139 142 135  K 3.1* 3.6 4.5 3.8 4.2  CL 91* 88* 94* 93* 89*  CO2 41*  --  33* 40* 37*  GLUCOSE 144*  144* 97 88 132*  BUN 16 21 13 18 17   CREATININE 0.49* 0.80 0.51* 0.88 0.50*  CALCIUM 8.9  --  8.7* 8.7* 8.7*  MG 1.8  --   --   --  2.1  PHOS  --   --   --   --  4.2   GFR: Estimated Creatinine Clearance: 78.9 mL/min (A) (by C-G formula based on SCr of 0.5 mg/dL (L)). Liver Function Tests: Recent Labs  Lab 04/28/23 1505 05/02/23 0407  AST 18 14*  ALT 19 18  ALKPHOS 48 45  BILITOT 0.7 0.5  PROT 7.1 6.7  ALBUMIN 3.6 3.4*   No results for input(s): "LIPASE", "AMYLASE" in the last 168 hours. No results for input(s): "AMMONIA" in the last 168 hours. Coagulation Profile: No results for input(s): "INR", "PROTIME" in the last 168 hours. Cardiac Enzymes: No results for input(s): "CKTOTAL", "CKMB", "CKMBINDEX", "TROPONINI" in the last 168 hours. BNP (last 3 results) No results for input(s): "PROBNP" in the last 8760 hours. HbA1C: No results for input(s): "HGBA1C" in the last 72 hours. CBG: No results for input(s): "GLUCAP" in the last 168 hours. Lipid Profile: No results for input(s): "CHOL", "HDL", "LDLCALC", "TRIG", "CHOLHDL", "LDLDIRECT" in the last 72 hours. Thyroid Function Tests: No results for input(s): "TSH", "T4TOTAL", "FREET4", "T3FREE", "THYROIDAB" in the last 72 hours. Anemia Panel: No results for input(s): "VITAMINB12", "FOLATE", "FERRITIN", "TIBC", "IRON", "RETICCTPCT" in the last 72 hours. Sepsis Labs: No results for input(s): "PROCALCITON", "LATICACIDVEN" in the last 168 hours.  Recent Results (from the past 240 hour(s))  Resp panel by RT-PCR (RSV, Flu A&B, Covid) Anterior Nasal Swab     Status: None   Collection Time: 04/28/23  3:05 PM   Specimen: Anterior Nasal Swab  Result Value Ref Range Status   SARS Coronavirus 2 by RT PCR NEGATIVE NEGATIVE Final    Comment: (NOTE) SARS-CoV-2 target nucleic acids are NOT DETECTED.  The SARS-CoV-2 RNA is generally detectable in upper respiratory specimens during the acute phase of infection. The lowest concentration of  SARS-CoV-2 viral copies this assay can detect is 138 copies/mL. A negative result does not preclude SARS-Cov-2 infection and should not be used as the sole basis for treatment or other patient management decisions. A negative result may occur with  improper specimen collection/handling,  submission of specimen other than nasopharyngeal swab, presence of viral mutation(s) within the areas targeted by this assay, and inadequate number of viral copies(<138 copies/mL). A negative result must be combined with clinical observations, patient history, and epidemiological information. The expected result is Negative.  Fact Sheet for Patients:  BloggerCourse.com  Fact Sheet for Healthcare Providers:  SeriousBroker.it  This test is no t yet approved or cleared by the [place] FDA and  has been authorized for detection and/or diagnosis of SARS-CoV-2 by FDA under an Emergency Use Authorization (EUA). This EUA will remain  in effect (meaning this test can be used) for the duration of the COVID-19 declaration under Section 564(b)(1) of the Act, 21 U.S.C.section 360bbb-3(b)(1), unless the authorization is terminated  or revoked sooner.       Influenza A by PCR NEGATIVE NEGATIVE Final   Influenza B by PCR NEGATIVE NEGATIVE Final    Comment: (NOTE) The Xpert Xpress SARS-CoV-2/FLU/RSV plus assay is intended as an aid in the diagnosis of influenza from Nasopharyngeal swab specimens and should not be used as a sole basis for treatment. Nasal washings and aspirates are unacceptable for Xpert Xpress SARS-CoV-2/FLU/RSV testing.  Fact Sheet for Patients: BloggerCourse.com  Fact Sheet for Healthcare Providers: SeriousBroker.it  This test is not yet approved or cleared by the [place] FDA and has been authorized for detection and/or diagnosis of SARS-CoV-2 by FDA under an Emergency Use  Authorization (EUA). This EUA will remain in effect (meaning this test can be used) for the duration of the COVID-19 declaration under Section 564(b)(1) of the Act, 21 U.S.C. section 360bbb-3(b)(1), unless the authorization is terminated or revoked.     Resp Syncytial Virus by PCR NEGATIVE NEGATIVE Final    Comment: (NOTE) Fact Sheet for Patients: BloggerCourse.com  Fact Sheet for Healthcare Providers: SeriousBroker.it  This test is not yet approved or cleared by the [place] FDA and has been authorized for detection and/or diagnosis of SARS-CoV-2 by FDA under an Emergency Use Authorization (EUA). This EUA will remain in effect (meaning this test can be used) for the duration of the COVID-19 declaration under Section 564(b)(1) of the Act, 21 U.S.C. section 360bbb-3(b)(1), unless the authorization is terminated or revoked.  Performed at [facility], [address], [place], [place] [address]   Respiratory (~20 pathogens) panel by PCR     Status: None   Collection Time: 04/28/23  3:05 PM   Specimen: Nasopharyngeal Swab; Respiratory  Result Value Ref Range Status   Adenovirus NOT DETECTED NOT DETECTED Final   Coronavirus 229E NOT DETECTED NOT DETECTED Final    Comment: (NOTE) The Coronavirus on the Respiratory Panel, DOES NOT test for the novel  Coronavirus (2019 nCoV)    Coronavirus HKU1 NOT DETECTED NOT DETECTED Final   Coronavirus NL63 NOT DETECTED NOT DETECTED Final   Coronavirus OC43 NOT DETECTED NOT DETECTED Final   Metapneumovirus NOT DETECTED NOT DETECTED Final   Rhinovirus / Enterovirus NOT DETECTED NOT DETECTED Final   Influenza A NOT DETECTED NOT DETECTED Final   Influenza B NOT DETECTED NOT DETECTED Final   Parainfluenza Virus 1 NOT DETECTED NOT DETECTED Final   Parainfluenza Virus 2 NOT DETECTED NOT DETECTED Final   Parainfluenza Virus 3 NOT DETECTED NOT DETECTED Final   Parainfluenza  Virus 4 NOT DETECTED NOT DETECTED Final   Respiratory Syncytial Virus NOT DETECTED NOT DETECTED Final   Bordetella pertussis NOT DETECTED NOT DETECTED Final   Bordetella Parapertussis NOT DETECTED NOT DETECTED Final  Chlamydophila pneumoniae NOT DETECTED NOT DETECTED Final   Mycoplasma pneumoniae NOT DETECTED NOT DETECTED Final    Comment: Performed at [facility], [address], [place], [place] [address]  MRSA Next Gen by PCR, Nasal     Status: None   Collection Time: 04/28/23  8:06 PM   Specimen: Nasal Mucosa; Nasal Swab  Result Value Ref Range Status   MRSA by PCR Next Gen NOT DETECTED NOT DETECTED Final    Comment: (NOTE) The GeneXpert MRSA Assay (FDA approved for NASAL specimens only), is one component of a comprehensive MRSA colonization surveillance program. It is not intended to diagnose MRSA infection nor to guide or monitor treatment for MRSA infections. Test performance is not FDA approved in patients less than 66 years old. Performed at [facility], [address], [place], [place] [address]     Radiology Studies: DG CHEST PORT 1 VIEW  Result Date: 05/02/2023 CLINICAL DATA:  Shortness of breath. EXAM: PORTABLE CHEST 1 VIEW COMPARISON:  April 28, 2023. FINDINGS: The heart size and mediastinal contours are within normal limits. Both lungs are clear. Hyperinflation of the lungs is noted. The visualized skeletal structures are unremarkable. IMPRESSION: No active disease. Electronically Signed   By: Lupita Raider M.D.   On: 05/02/2023 10:50    Scheduled Meds:  arformoterol  15 mcg Nebulization BID   aspirin EC  81 mg Oral Daily   budesonide (PULMICORT) nebulizer solution  0.25 mg Nebulization BID   enoxaparin (LOVENOX) injection  40 mg Subcutaneous Q24H   famotidine  20 mg Oral BID   feeding supplement  237 mL Oral BID BM   guaiFENesin  1,200 mg Oral BID   ipratropium-albuterol  3 mL Nebulization TID   methylPREDNISolone (SOLU-MEDROL)  injection  40 mg Intravenous Q12H   rosuvastatin  40 mg Oral Daily   Continuous Infusions:   LOS: 4 days   Marguerita Merles, DO [facility] Available via Epic secure chat 7am-7pm After these hours, please refer to coverage provider listed on [email or web address] 05/02/2023, 4:10 PM

## 2023-05-02 NOTE — Progress Notes (Signed)
PHYSICAL THERAPY  SATURATION QUALIFICATIONS: (This note is used to comply with regulatory documentation for home oxygen)  Patient Saturations on Room Air at Rest = unable to perform RA due to long Hx 2 lts prior to admit  Patient Saturations on 2 Liters of oxygen while Ambulating 25 feet = 84% Pt required 4 Liters of oxygen during activity of ambulation 25 feet = 90%  Please briefly explain why patient needs home oxygen:  Pt required 2 lts at rest and 4 lts with activity to achieve therapeutic levels.  Felecia Shelling  PTA Acute  Rehabilitation Services Office M-F          [phone]

## 2023-05-02 NOTE — Telephone Encounter (Signed)
Please schedule patient for hospital follow up for COPD with earliest possible appointment with Dr. Sherene Sires, an APP or a different provider.  Thanks, JD

## 2023-05-02 NOTE — Plan of Care (Signed)
  Problem: Education: Goal: Knowledge of General Education information will improve Description: Including pain rating scale, medication(s)/side effects and non-pharmacologic comfort measures Outcome: Progressing   Problem: Clinical Measurements: Goal: Will remain free from infection Outcome: Progressing   Problem: Clinical Measurements: Goal: Respiratory complications will improve Outcome: Progressing   Problem: Nutrition: Goal: Adequate nutrition will be maintained Outcome: Progressing   Problem: Pain Management: Goal: General experience of comfort will improve Outcome: Progressing   Problem: Safety: Goal: Ability to remain free from injury will improve Outcome: Progressing

## 2023-05-03 ENCOUNTER — Other Ambulatory Visit (HOSPITAL_COMMUNITY): Payer: Self-pay

## 2023-05-03 DIAGNOSIS — K219 Gastro-esophageal reflux disease without esophagitis: Secondary | ICD-10-CM | POA: Diagnosis not present

## 2023-05-03 DIAGNOSIS — J9611 Chronic respiratory failure with hypoxia: Secondary | ICD-10-CM | POA: Diagnosis not present

## 2023-05-03 DIAGNOSIS — I5032 Chronic diastolic (congestive) heart failure: Secondary | ICD-10-CM | POA: Diagnosis not present

## 2023-05-03 DIAGNOSIS — J441 Chronic obstructive pulmonary disease with (acute) exacerbation: Secondary | ICD-10-CM | POA: Diagnosis not present

## 2023-05-03 LAB — RETICULOCYTES
Immature Retic Fract: 11.8 % (ref 2.3–15.9)
RBC.: 3.63 MIL/uL — ABNORMAL LOW (ref 4.22–5.81)
Retic Count, Absolute: 51.2 10*3/uL (ref 19.0–186.0)
Retic Ct Pct: 1.4 % (ref 0.4–3.1)

## 2023-05-03 LAB — IRON AND TIBC
Iron: 54 ug/dL (ref 45–182)
Saturation Ratios: 17 % — ABNORMAL LOW (ref 17.9–39.5)
TIBC: 312 ug/dL (ref 250–450)
UIBC: 258 ug/dL

## 2023-05-03 LAB — VITAMIN B12: Vitamin B-12: 488 pg/mL (ref 180–914)

## 2023-05-03 LAB — FERRITIN: Ferritin: 142 ng/mL (ref 24–336)

## 2023-05-03 LAB — FOLATE: Folate: 6.4 ng/mL (ref >5.9)

## 2023-05-03 MED ORDER — IPRATROPIUM-ALBUTEROL 0.5-2.5 (3) MG/3ML IN SOLN
3.0000 mL | Freq: Two times a day (BID) | RESPIRATORY_TRACT | Status: DC
  Administered 2023-05-03 – 2023-05-10 (×14): 3 mL via RESPIRATORY_TRACT
  Filled 2023-05-03 (×14): qty 3

## 2023-05-03 MED ORDER — DOCUSATE SODIUM 100 MG PO CAPS
100.0000 mg | ORAL_CAPSULE | Freq: Two times a day (BID) | ORAL | Status: DC
  Administered 2023-05-03 – 2023-05-10 (×15): 100 mg via ORAL
  Filled 2023-05-03 (×14): qty 1

## 2023-05-03 NOTE — Progress Notes (Addendum)
Mobility Specialist - Progress Note   05/03/23 1359  Mobility  Activity Ambulated with assistance in hallway  Level of Assistance Standby assist, set-up cues, supervision of patient - no hands on  Assistive Device Front wheel walker  Distance Ambulated (ft) 240 ft  Activity Response Tolerated well  Mobility Referral Yes  $Mobility charge 1 Mobility  Mobility Specialist Start Time (ACUTE ONLY) 0147  Mobility Specialist Stop Time (ACUTE ONLY) 0157  Mobility Specialist Time Calculation (min) (ACUTE ONLY) 10 min   Pt received in bed and agreeable to mobility. During ambulation, pt took x2 standing rest breaks d/t fatigue & SOB. Unable to obtain SpO2 reading due to malfunctioning pulse ox. Pt on 2L [place] throughout session. No other complaints during session. Pt to bed after session with all needs met.    [facility]

## 2023-05-03 NOTE — Progress Notes (Signed)
PROGRESS NOTE    Anthony Nelson  MVH:[id number] DOB: 07-Apr-1961 DOA: 04/28/2023 PCP: Storm Frisk, MD   Brief Narrative:  Anthony Nelson is a 62 yo male with PMH COPD, chronic hypoxia on 2L 02, pulmonary hypertension, GERD, diastolic CHF and other comorbidities who presented with shortness of breath. Symptoms have been ongoing for several days and he began having nonproductive cough and sensation of difficulty clearing sputum but he feels more short winded. Slightly hypoxic on admission, 89% and placed on 3 L.  VBG notable for hypercarbia and pH 7.28.  He was placed on BiPAP and admitted for further workup and monitoring.  He is slowly improving however he continues to desaturate but continues to improve daily.  Will watch him another night and continue IV steroids and taper down to 40 twice daily.  Repeat chest x-ray this a.m. showed no active disease and hematuria on O2 screen done showed that he needs at least 4 L while ambulating.  Patient was to be discharged back to the service center house today however was informed that he is not able to return.  Presently does not have a safe discharge disposition and the TOC team is trying to find a safe discharge disposition for him  Assessment and Plan:  COPD with Acute Exacerbation (HCC), improving -Mainly dyspnea and tight breath sounds; nonproductive cough -Desaturates and when working with PT the patient dropped to 86% just getting to the edge of the bed. Dropped to 87% on 4lnc with ambulation. -Added guaifenesin 1200 ohms p.o. twice daily and continue spirometry and flutter valve -Continue with breathing treatments with DuoNeb 3 mL 3 times daily and add Brovana 15 mcg neb twice daily and budesonide 0.25 mg p.o. twice daily; also has albuterol 2.5 mg Neb every 2 as needed for wheezing -Continued with IV Steroids but increased from IV 60 daily to IV 60 mg every 12 and will go back down to 40 mg BID and will change to p.o. prednisone in the a.m. with  prednisone Taper for D/C  -Continue BiPAP as needed and at night; may need to discuss with pulmonary about getting the patient qualified for trilogy BiPAP and discuss with Pulmonary -Continuous pulse oximetry and maintain O2 saturations greater than 90% -Continue supplemental oxygen via nasal cannula and wean to home regimen -Repeat chest x-ray in the a.m. this a.m. showed no active disease with amatory home O2 screen done and he did desaturate on 2 L to 84% but came back up to 90% on 4 Liters    Homeless -Resides at a shelter.  They are requesting PT eval prior to returning and PT recommending no Follow up and now requesting OT. -TOC to be involved; Plan was for the patient to [facility] however I was informed by the caseworker that the [facility] has reported concerns with the patient's ability to care for himself and have now told the caseworker that the patient not able to [facility].  Currently patient does not have a safe discharge disposition given this   GERD/GI Prophylaxis -Continue Famotidine 20 mg po BID   Hyperlipidemia -Continue Rosuvastatin 40 mg po Daily    Chronic Respiratory Failure with Hypoxia and Hypercapnia (HCC) -Chronic 2L at home SpO2: 93 % O2 Flow Rate (L/min): 2 L/min FiO2 (%): 35 % -Placed on 3L initially then was placed on BiPAP and now stable off BiPAP but we're using it at night.  He states that he has a BiPAP  at home and will continue this at discharge -Patient desaturated on ambulatory home O2 screen and had to be placed on 4 L while ambulating he may need to increase his oxygen while ambulating at discharge   Chronic Diastolic Heart Failure (HCC -No evidence of acute decompensation of heart failure -Last TTE 04/18/2022 with LVEF 50-55%, LV with no regional wall trauma maladies, RV mildly enlarged, trivial MR.   .Strict I's and O's and Daily Weights;  Intake/Output Summary (Last 24 hours) at 05/03/2023 1510 Last data  filed at 05/03/2023 0528 Gross per 24 hour  Intake 240 ml  Output 1000 ml  Net -760 ml  -Only on lasix PRN at home; will resume if needed -Continue to monitor for signs or symptoms of volume overload  Macrocytic Anemia -Hgb/Hct Trend: Recent Labs  Lab 04/09/23 2246 04/10/23 0607 04/28/23 1505 04/28/23 1506 04/29/23 0633 04/30/23 0700 05/02/23 0407  HGB 11.8* 11.5* 12.0* 13.6 10.9* 10.5* 11.5*  HCT 39.7 38.3* 40.7 40.0 36.2* 33.9* 36.8*  MCV 103.9* 103.2* 106.3*  --  104.6* 102.7* 101.7*  -Checked Anemia Panel showed an iron level 54, UIBC 258, TIBC 312, saturation ratios of 17%, ferritin level 142, folate of 6.4, vitamin B12 level 488 -Continue to Monitor for S/Sx of Bleeding; No overt bleeding noted -Repeat CBC in the AM   Thrombocytopenia -Platelet Count Trend: Recent Labs  Lab 04/09/23 1429 04/09/23 2246 04/10/23 0607 04/28/23 1505 04/29/23 0633 04/30/23 0700 05/02/23 0407  PLT 149* 139* 144* 159 141* 148* 159  -Continue to Monitor for S/Sx of Bleeding; No overt bleeding noted  -Repeat CBC in the AM   Severe Protein Calorie Malnutrition Nutrition Status: Nutrition Problem: Severe Malnutrition Etiology: chronic illness (COPD) Signs/Symptoms: severe fat depletion, severe muscle depletion Interventions: Refer to RD note for recommendations, Ensure Enlive (each supplement provides 350kcal and 20 grams of protein), MVI, Liberalize Diet  Hypoalbuminemia -Patient's Albumin Trend: Recent Labs  Lab 04/10/23 0607 04/28/23 1505 05/02/23 0407  ALBUMIN 3.2* 3.6 3.4*  -Continue to Monitor and Trend and repeat CMP in the AM   DVT prophylaxis: enoxaparin (LOVENOX) injection 40 mg Start: 04/28/23 2200 SCDs Start: 04/28/23 1706    Code Status: Full Code Family Communication: No family currently at bedside  Disposition Plan:  Level of care: Progressive Status is: Inpatient Remains inpatient appropriate because: Patient was to return to surgeon set up however they are  no longer any taking him back so he needs a safe discharge disposition   Consultants:  None  Procedures:  As delineated as above  Antimicrobials:  Anti-infectives (From admission, onward)    Start     Dose/Rate Route Frequency Ordered Stop   04/28/23 1545  cefTRIAXone (ROCEPHIN) 1 g in sodium chloride 0.9 % 100 mL IVPB        1 g 200 mL/hr over 30 Minutes Intravenous  Once 04/28/23 1532 04/28/23 1606   04/28/23 1545  doxycycline (VIBRA-TABS) tablet 100 mg        100 mg Oral  Once 04/28/23 1532 04/28/23 1535       Subjective: Seen and examined at bedside and he is very frustrated.  Had some anxiety given his lack of housing now.  No nausea or vomiting.  Otherwise feels okay and thinks his breathing is back to baseline now.  Objective: Vitals:   05/02/23 2137 05/03/23 0527 05/03/23 0903 05/03/23 1346  BP: 110/69 101/71  102/75  Pulse: 99 77  92  Resp: 20 (!) 21  20  Temp: 97.7 F (  36.5 C) 97.9 F (36.6 C)  98.1 F (36.7 C)  TempSrc: Oral Oral  Oral  SpO2: 92% 98% 95% 93%  Weight:      Height:        Intake/Output Summary (Last 24 hours) at 05/03/2023 1510 Last data filed at 05/03/2023 1610 Gross per 24 hour  Intake 240 ml  Output 1000 ml  Net -760 ml   Filed Weights   04/28/23 1440  Weight: 59 kg   Examination: Physical Exam:  Constitutional: Thin African-American male who is extremely anxious Respiratory: Diminished to auscultation bilaterally, no wheezing, rales, rhonchi or crackles. Normal respiratory effort and patient is not tachypenic. No accessory muscle use.  Unlabored breathing Cardiovascular: RRR, no murmurs / rubs / gallops. S1 and S2 auscultated. No extremity edema.  Abdomen: Soft, non-tender, non-distended. Bowel sounds positive.  GU: Deferred. Musculoskeletal: No clubbing / cyanosis of digits/nails. No joint deformity upper and lower extremities. Skin: No rashes, lesions, ulcers on limited skin evaluation. No induration; Warm and dry.  Neurologic:  CN 2-12 grossly intact with no focal deficits. Romberg sign and cerebellar reflexes not assessed.  Psychiatric: Normal judgment and insight. Alert and oriented x 3.  Very anxious mood  Data Reviewed: I have personally reviewed following labs and imaging studies  CBC: Recent Labs  Lab 04/28/23 1505 04/28/23 1506 04/29/23 0633 04/30/23 0700 05/02/23 0407  WBC 7.4  --  5.5 6.8 8.1  NEUTROABS 6.2  --   --   --  7.7  HGB 12.0* 13.6 10.9* 10.5* 11.5*  HCT 40.7 40.0 36.2* 33.9* 36.8*  MCV 106.3*  --  104.6* 102.7* 101.7*  PLT 159  --  141* 148* 159   Basic Metabolic Panel: Recent Labs  Lab 04/28/23 1505 04/28/23 1506 04/29/23 0345 04/30/23 0700 05/02/23 0407  NA 141 140 139 142 135  K 3.1* 3.6 4.5 3.8 4.2  CL 91* 88* 94* 93* 89*  CO2 41*  --  33* 40* 37*  GLUCOSE 144* 144* 97 88 132*  BUN 16 21 13 18 17   CREATININE 0.49* 0.80 0.51* 0.88 0.50*  CALCIUM 8.9  --  8.7* 8.7* 8.7*  MG 1.8  --   --   --  2.1  PHOS  --   --   --   --  4.2   GFR: Estimated Creatinine Clearance: 78.9 mL/min (A) (by C-G formula based on SCr of 0.5 mg/dL (L)). Liver Function Tests: Recent Labs  Lab 04/28/23 1505 05/02/23 0407  AST 18 14*  ALT 19 18  ALKPHOS 48 45  BILITOT 0.7 0.5  PROT 7.1 6.7  ALBUMIN 3.6 3.4*   No results for input(s): "LIPASE", "AMYLASE" in the last 168 hours. No results for input(s): "AMMONIA" in the last 168 hours. Coagulation Profile: No results for input(s): "INR", "PROTIME" in the last 168 hours. Cardiac Enzymes: No results for input(s): "CKTOTAL", "CKMB", "CKMBINDEX", "TROPONINI" in the last 168 hours. BNP (last 3 results) No results for input(s): "PROBNP" in the last 8760 hours. HbA1C: No results for input(s): "HGBA1C" in the last 72 hours. CBG: No results for input(s): "GLUCAP" in the last 168 hours. Lipid Profile: No results for input(s): "CHOL", "HDL", "LDLCALC", "TRIG", "CHOLHDL", "LDLDIRECT" in the last 72 hours. Thyroid Function Tests: No results for  input(s): "TSH", "T4TOTAL", "FREET4", "T3FREE", "THYROIDAB" in the last 72 hours. Anemia Panel: Recent Labs    05/03/23 0414  VITAMINB12 488  FOLATE 6.4  FERRITIN 142  TIBC 312  IRON 54  RETICCTPCT 1.4  Sepsis Labs: No results for input(s): "PROCALCITON", "LATICACIDVEN" in the last 168 hours.  Recent Results (from the past 240 hour(s))  Resp panel by RT-PCR (RSV, Flu A&B, Covid) Anterior Nasal Swab     Status: None   Collection Time: 04/28/23  3:05 PM   Specimen: Anterior Nasal Swab  Result Value Ref Range Status   SARS Coronavirus 2 by RT PCR NEGATIVE NEGATIVE Final    Comment: (NOTE) SARS-CoV-2 target nucleic acids are NOT DETECTED.  The SARS-CoV-2 RNA is generally detectable in upper respiratory specimens during the acute phase of infection. The lowest concentration of SARS-CoV-2 viral copies this assay can detect is 138 copies/mL. A negative result does not preclude SARS-Cov-2 infection and should not be used as the sole basis for treatment or other patient management decisions. A negative result may occur with  improper specimen collection/handling, submission of specimen other than nasopharyngeal swab, presence of viral mutation(s) within the areas targeted by this assay, and inadequate number of viral copies(<138 copies/mL). A negative result must be combined with clinical observations, patient history, and epidemiological information. The expected result is Negative.  Fact Sheet for Patients:  BloggerCourse.com  Fact Sheet for Healthcare Providers:  SeriousBroker.it  This test is no t yet approved or cleared by the [place] FDA and  has been authorized for detection and/or diagnosis of SARS-CoV-2 by FDA under an Emergency Use Authorization (EUA). This EUA will remain  in effect (meaning this test can be used) for the duration of the COVID-19 declaration under Section 564(b)(1) of the Act, 21 U.S.C.section  360bbb-3(b)(1), unless the authorization is terminated  or revoked sooner.       Influenza A by PCR NEGATIVE NEGATIVE Final   Influenza B by PCR NEGATIVE NEGATIVE Final    Comment: (NOTE) The Xpert Xpress SARS-CoV-2/FLU/RSV plus assay is intended as an aid in the diagnosis of influenza from Nasopharyngeal swab specimens and should not be used as a sole basis for treatment. Nasal washings and aspirates are unacceptable for Xpert Xpress SARS-CoV-2/FLU/RSV testing.  Fact Sheet for Patients: BloggerCourse.com  Fact Sheet for Healthcare Providers: SeriousBroker.it  This test is not yet approved or cleared by the [place] FDA and has been authorized for detection and/or diagnosis of SARS-CoV-2 by FDA under an Emergency Use Authorization (EUA). This EUA will remain in effect (meaning this test can be used) for the duration of the COVID-19 declaration under Section 564(b)(1) of the Act, 21 U.S.C. section 360bbb-3(b)(1), unless the authorization is terminated or revoked.     Resp Syncytial Virus by PCR NEGATIVE NEGATIVE Final    Comment: (NOTE) Fact Sheet for Patients: BloggerCourse.com  Fact Sheet for Healthcare Providers: SeriousBroker.it  This test is not yet approved or cleared by the [place] FDA and has been authorized for detection and/or diagnosis of SARS-CoV-2 by FDA under an Emergency Use Authorization (EUA). This EUA will remain in effect (meaning this test can be used) for the duration of the COVID-19 declaration under Section 564(b)(1) of the Act, 21 U.S.C. section 360bbb-3(b)(1), unless the authorization is terminated or revoked.  Performed at [facility], [address], [place], [place] [address]   Respiratory (~20 pathogens) panel by PCR     Status: None   Collection Time: 04/28/23  3:05 PM   Specimen: Nasopharyngeal Swab;  Respiratory  Result Value Ref Range Status   Adenovirus NOT DETECTED NOT DETECTED Final   Coronavirus 229E NOT DETECTED NOT DETECTED Final    Comment: (NOTE) The Coronavirus on  the Respiratory Panel, DOES NOT test for the novel  Coronavirus (2019 nCoV)    Coronavirus HKU1 NOT DETECTED NOT DETECTED Final   Coronavirus NL63 NOT DETECTED NOT DETECTED Final   Coronavirus OC43 NOT DETECTED NOT DETECTED Final   Metapneumovirus NOT DETECTED NOT DETECTED Final   Rhinovirus / Enterovirus NOT DETECTED NOT DETECTED Final   Influenza A NOT DETECTED NOT DETECTED Final   Influenza B NOT DETECTED NOT DETECTED Final   Parainfluenza Virus 1 NOT DETECTED NOT DETECTED Final   Parainfluenza Virus 2 NOT DETECTED NOT DETECTED Final   Parainfluenza Virus 3 NOT DETECTED NOT DETECTED Final   Parainfluenza Virus 4 NOT DETECTED NOT DETECTED Final   Respiratory Syncytial Virus NOT DETECTED NOT DETECTED Final   Bordetella pertussis NOT DETECTED NOT DETECTED Final   Bordetella Parapertussis NOT DETECTED NOT DETECTED Final   Chlamydophila pneumoniae NOT DETECTED NOT DETECTED Final   Mycoplasma pneumoniae NOT DETECTED NOT DETECTED Final    Comment: Performed at [facility], [address] [place], [place] [address]  MRSA Next Gen by PCR, Nasal     Status: None   Collection Time: 04/28/23  8:06 PM   Specimen: Nasal Mucosa; Nasal Swab  Result Value Ref Range Status   MRSA by PCR Next Gen NOT DETECTED NOT DETECTED Final    Comment: (NOTE) The GeneXpert MRSA Assay (FDA approved for NASAL specimens only), is one component of a comprehensive MRSA colonization surveillance program. It is not intended to diagnose MRSA infection nor to guide or monitor treatment for MRSA infections. Test performance is not FDA approved in patients less than 59 years old. Performed at [facility], [address] [place], [place] [address]     Radiology Studies: DG CHEST PORT 1 VIEW  Result Date:  05/02/2023 CLINICAL DATA:  Shortness of breath. EXAM: PORTABLE CHEST 1 VIEW COMPARISON:  April 28, 2023. FINDINGS: The heart size and mediastinal contours are within normal limits. Both lungs are clear. Hyperinflation of the lungs is noted. The visualized skeletal structures are unremarkable. IMPRESSION: No active disease. Electronically Signed   By: Lupita Raider M.D.   On: 05/02/2023 10:50    Scheduled Meds:  arformoterol  15 mcg Nebulization BID   aspirin EC  81 mg Oral Daily   budesonide (PULMICORT) nebulizer solution  0.25 mg Nebulization BID   docusate sodium  100 mg Oral BID   enoxaparin (LOVENOX) injection  40 mg Subcutaneous Q24H   famotidine  20 mg Oral BID   feeding supplement  237 mL Oral BID BM   guaiFENesin  1,200 mg Oral BID   ipratropium-albuterol  3 mL Nebulization BID   methylPREDNISolone (SOLU-MEDROL) injection  40 mg Intravenous Q12H   rosuvastatin  40 mg Oral Daily   Continuous Infusions:   LOS: 5 days   Marguerita Merles, DO [facility] Available via Epic secure chat 7am-7pm After these hours, please refer to coverage provider listed on [email or web address] 05/03/2023, 3:10 PM

## 2023-05-03 NOTE — Progress Notes (Signed)
   05/03/23 2042  BiPAP/CPAP/SIPAP  Reason BIPAP/CPAP not in use Other(comment) (Pt not ready to wear it yet, machine remained bedside)  BiPAP/CPAP /SiPAP Vitals  Resp 13  MEWS Score/Color  MEWS Score 1  MEWS Score Color Green

## 2023-05-03 NOTE — TOC Benefit Eligibility Note (Signed)
Patient Product/process development scientist completed.    The patient is insured through [place]. Patient has Medicare and is not eligible for a copay card, but may be able to apply for patient assistance, if available.    Ran test claim for Breztri [id number] mcg and the current 30 day co-pay is $0.00.  Ran test claim for Trelegy Ellipta 100-62.5-25 mcg and the current 30 day co-pay is $0.00.  This test claim was processed through [facility]- copay amounts may vary at other pharmacies due to pharmacy/plan contracts, or as the patient moves through the different stages of their insurance plan.     Roland Earl, CPHT Pharmacy Technician III Certified Patient Advocate [facility] Patient Advocate Team Direct Number: [phone]  Fax: [phone]

## 2023-05-03 NOTE — Progress Notes (Addendum)
Physical Therapy Treatment Patient Details Name: Anthony Nelson MRN: [id number] DOB: 04-Dec-1960 Today's Date: 05/03/2023   History of Present Illness 62 yo male admitted with COPD exac. Hx of COPD-2L baseline, HF, pulm HTN    PT Comments  Patient  resting on 2 L, SPO2 95%,  resting on RA  93%. Patient ambulated x 100' using RW on 2 L SPO2  dropped to 81 % . Increased to 4 LPM, SPO2 85% x ~ 100'. Patient requires stops  to stand and rest.   Patient reports 2 L baseline, and use of rollator, now requires increased supplemental O2  for activity. Patient may benefit from short Rehab to  promote activity/ADLs to  decrease amount of supplemental O2 and improve functional capacity/ Patient will benefit from continued inpatient follow up therapy, <3 hours/day    If plan is discharge home, recommend the following: Assist for transportation;Help with stairs or ramp for entrance;A little help with bathing/dressing/bathroom   Can travel by private vehicle        Equipment Recommendations  None recommended by PT    Recommendations for Other Services       Precautions / Restrictions Precautions Precautions: Fall Precaution Comments: monitor O2 Restrictions Weight Bearing Restrictions: No     Mobility  Bed Mobility Overal bed mobility: Modified Independent                  Transfers Overall transfer level: Modified independent                      Ambulation/Gait Ambulation/Gait assistance: Supervision Gait Distance (Feet): 100 Feet (x3 with stand rest break x2) Assistive device: Rolling walker (2 wheels) Gait Pattern/deviations: Step-through pattern, Decreased stride length Gait velocity: Decreased     General Gait Details: Highest HR: 111, Lowest O2: 81 % on 2 L. Patient ambulated on  2 L then 4 L O2 with SPO2 85%   Stairs             Wheelchair Mobility     Tilt Bed    Modified Rankin (Stroke Patients Only)       Balance Overall balance  assessment: Mild deficits observed, not formally tested                                          Cognition Arousal: Alert   Overall Cognitive Status: Within Functional Limits for tasks assessed                                 General Comments: Alert and appropriate;pleasant        Exercises      General Comments        Pertinent Vitals/Pain Pain Assessment Pain Assessment: No/denies pain    Home Living                          Prior Function            PT Goals (current goals can now be found in the care plan section) Progress towards PT goals: Progressing toward goals    Frequency    Min 1X/week      PT Plan      Co-evaluation              AM-PAC  PT "6 Clicks" Mobility   Outcome Measure  Help needed turning from your back to your side while in a flat bed without using bedrails?: None Help needed moving from lying on your back to sitting on the side of a flat bed without using bedrails?: None Help needed moving to and from a bed to a chair (including a wheelchair)?: None Help needed standing up from a chair using your arms (e.g., wheelchair or bedside chair)?: None Help needed to walk in hospital room?: A Little Help needed climbing 3-5 steps with a railing? : A Lot 6 Click Score: 21    End of Session Equipment Utilized During Treatment: Oxygen Activity Tolerance: Patient tolerated treatment well Patient left: in bed;with call bell/phone within reach;with bed alarm set Nurse Communication: Mobility status PT Visit Diagnosis: Difficulty in walking, not elsewhere classified (R26.2)     Time: 1445-1510 PT Time Calculation (min) (ACUTE ONLY): 25 min  Charges:    $Gait Training: 23-37 mins PT General Charges $$ ACUTE PT VISIT: 1 Visit                    Blanchard Kelch PT Acute Rehabilitation Services Office [phone] Weekend pager-[phone]   Rada Hay 05/03/2023, 3:14 PM

## 2023-05-03 NOTE — Evaluation (Signed)
Occupational Therapy Evaluation Patient Details Name: Anthony Nelson MRN: [id number] DOB: 02-07-1961 Today's Date: 05/03/2023   History of Present Illness 62 yr old male admitted with COPD exacerbation. Hx of COPD-2L baseline, HF, pulmonary HTN   Clinical Impression   Pt is currently limited by the below listed deficits which compromise his ADL performance and overall functional independence (see OT problem list). He currently requires SBA to CGA for tasks, including dressing, sit to stand, ambulation, and simulated toileting. During the session today, he was noted to be with compromised endurance and shortness of breath with activity, requiring intermittent rest breaks and cues for deep breathing. Pt believes short-term post-acute rehab services would be beneficial to him. OT will follow him for further services in the acute setting to maximize his independence with ADLs and to decrease the risk for restricted participation in meaningful activities.        If plan is discharge home, recommend the following: Assist for transportation;Help with stairs or ramp for entrance    Functional Status Assessment  Patient has had a recent decline in their functional status and demonstrates the ability to make significant improvements in function in a reasonable and predictable amount of time.  Equipment Recommendations  Tub/shower seat    Recommendations for Other Services       Precautions / Restrictions Precautions Precautions: Fall Precaution Comments: monitor O2 Restrictions Weight Bearing Restrictions: No      Mobility Bed Mobility Overal bed mobility: Needs Assistance Bed Mobility: Supine to Sit     Supine to sit: Supervision, Used rails, HOB elevated          Transfers   Equipment used: Rolling walker (2 wheels) Transfers: Sit to/from Stand Sit to Stand: Supervision                  Balance     Sitting balance-Leahy Scale: Good         Standing balance  comment: SBA to light CGA                ADL either performed or assessed with clinical judgement   ADL Overall ADL's : Needs assistance/impaired Eating/Feeding: Independent Eating/Feeding Details (indicate cue type and reason): based on clinical judgement Grooming: Supervision/safety;Standing Grooming Details (indicate cue type and reason): at sink level, based on clinical judgement         Upper Body Dressing : Set up;Sitting Upper Body Dressing Details (indicate cue type and reason): simulated seated EOB Lower Body Dressing: Supervision/safety;Set up Lower Body Dressing Details (indicate cue type and reason): He donned his socks seated EOB.                     Vision Baseline Vision/History: 1 Wears glasses              Pertinent Vitals/Pain Pain Assessment Pain Assessment: No/denies pain     Extremity/Trunk Assessment Upper Extremity Assessment Upper Extremity Assessment: Overall WFL for tasks assessed   Lower Extremity Assessment Lower Extremity Assessment: Overall WFL for tasks assessed       Communication Communication Communication: No apparent difficulties   Cognition Arousal: Alert Behavior During Therapy: WFL for tasks assessed/performed Overall Cognitive Status: Within Functional Limits for tasks assessed        General Comments: Oriented x4, able to follow commands                Home Living Family/patient expects to be discharged to:: Shelter/Homeless  Home Equipment: Rollator (4 wheels)   Additional Comments: He reported he has been living at a homeless shelter that he cannot return to.      Prior Functioning/Environment Prior Level of Function : Independent/Modified Independent             Mobility Comments: Used a rollator for ambulation. ADLs Comments: Modified independent to independent with ADLs. Used 2L O2 at baseline.        OT Problem List: Decreased strength;Decreased activity  tolerance;Cardiopulmonary status limiting activity      OT Treatment/Interventions: Self-care/ADL training;Therapeutic exercise;Energy conservation;DME and/or AE instruction;Manual therapy;Therapeutic activities;Patient/family education;Balance training    OT Goals(Current goals can be found in the care plan section) Acute Rehab OT Goals Patient Stated Goal: to get better OT Goal Formulation: With patient Time For Goal Achievement: 05/17/23 Potential to Achieve Goals: Good ADL Goals Pt Will Perform Grooming: with modified independence;standing Pt Will Perform Lower Body Dressing: with modified independence;sit to/from stand Pt Will Transfer to Toilet: with modified independence;ambulating Pt Will Perform Toileting - Clothing Manipulation and hygiene: with modified independence;sit to/from stand  OT Frequency: Min 1X/week       AM-PAC OT "6 Clicks" Daily Activity     Outcome Measure Help from another person eating meals?: None Help from another person taking care of personal grooming?: A Little Help from another person toileting, which includes using toliet, bedpan, or urinal?: A Little Help from another person bathing (including washing, rinsing, drying)?: A Little Help from another person to put on and taking off regular upper body clothing?: A Little Help from another person to put on and taking off regular lower body clothing?: A Little 6 Click Score: 19   End of Session Equipment Utilized During Treatment: Oxygen Nurse Communication: Mobility status  Activity Tolerance: Other (comment) (limited by compromised endurance) Patient left: in bed;with call bell/phone within reach  OT Visit Diagnosis: Unsteadiness on feet (R26.81);Muscle weakness (generalized) (M62.81)                Time: 4098-1191 OT Time Calculation (min): 15 min Charges:  OT General Charges $OT Visit: 1 Visit OT Evaluation $OT Eval Moderate Complexity: 1 Mod    Esty Ahuja L Jamilia Jacques, OTR/L 05/03/2023, 5:09 PM

## 2023-05-03 NOTE — TOC Progression Note (Addendum)
Transition of Care [facility]) - Progression Note    Patient Details  Name: Anthony Nelson MRN: [id number] Date of Birth: May 03, 1961  Transition [facility]) CM/SW Contact  Larrie Kass, LCSW Phone Number: 05/03/2023, 10:50 AM  Clinical Narrative:      CSW received a call from [employer], she stated she has been working with the pt to get permanent housing. She stated receiving a call from the pt's transitional housing, reporting that the patient is not able to return, due to them believing pt is not able to live independently Lias reported pt would not qualify for permanent housing if this is the case. CSW informed her about pt's PT eval. She referred CSW to speak with the [facility].   CSW spoke with [place] with the servant center, she reported her concerns with the pt's ability to care for himself. She reported pt is not able to clean his room, is not able to walk to the nurse station for daily check-in, not eating well (not able to walk to the cafe). She reports pt would refuse home health when it was offered. She reported pt is not able to return.   CSW spoke with [facility]'s [facility] [place], she reported pt is not service-connected and does not have LTC benefits through the [place]. She reported pt would have to apply for Medicaid for LTC. CSW informed her that pt was not recommended for rehab, and does not have a place to go. [facility] is unable to assist with finding placement.   CSW spoke with pt regarding the concerns the trans housing has, he stated "The staff a the facility Just doesn't want my tanks." CSW inquired about a family that could take him in. Pt reported his family has kids and they do not have the space for him. Pt reported no funds for a hotel, however stated he receives 1500 in SSI.  CSW has requested PT/OT to re-evaluate pt for possible SNF placement. TOC to follow.    Adden 3:30pm  CSW discussed recommendations for SNF placement with the pt. Pt has agreed to placement.  CSW discussed the barriers and explained the process, pt will need insurance authorization. TOC to follow.   Expected Discharge Plan and Services       Living arrangements for the past 2 months: Boarding House                                       Social Determinants of Health (SDOH) Interventions SDOH Screenings   Food Insecurity: No Food Insecurity (04/28/2023)  Recent Concern: Food Insecurity - Food Insecurity Present (04/10/2023)  Housing: Low Risk  (04/28/2023)  Recent Concern: Housing - Medium Risk (04/10/2023)  Transportation Needs: No Transportation Needs (04/29/2023)  Recent Concern: Transportation Needs - Unmet Transportation Needs (04/28/2023)  Utilities: Not At Risk (04/28/2023)  Alcohol Screen: Low Risk  (12/26/2022)  Depression (PHQ2-9): Low Risk  (01/16/2023)  Physical Activity: Insufficiently Active (12/26/2022)  Social Connections: Moderately Isolated (12/26/2022)  Stress: No Stress Concern Present (12/26/2022)  Tobacco Use: Medium Risk (04/28/2023)    Readmission Risk Interventions     No data to display

## 2023-05-03 NOTE — NC FL2 (Signed)
[place] MEDICAID FL2 LEVEL OF CARE FORM     IDENTIFICATION  Patient Name: Anthony Nelson Birthdate: 06-Mar-1961 Sex: male Admission Date (Current Location): 04/28/2023  [facility] [place] Number:  Producer, television/film/video and Address:  [facility],  [address] [place], [place] [address]      Provider Number: [id number]  Attending Physician Name and Address:  Merlene Laughter, DO  Relative Name and Phone Number:       Current Level of Care: Hospital Recommended Level of Care: Skilled Nursing Facility Prior Approval Number:    Date Approved/Denied:   PASRR Number: [id number]  Discharge Plan: SNF    Current Diagnoses: Patient Active Problem List   Diagnosis Date Noted   COPD with acute exacerbation (HCC) 04/28/2023   Protein-calorie malnutrition, severe 04/11/2023   COPD exacerbation (HCC) 04/09/2023   Chronic right-sided congestive heart failure (HCC) 01/16/2023   Homeless 01/16/2023   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   Gastroesophageal reflux disease 01/29/2022   Hyperlipidemia 10/04/2020   Macrocytosis 09/03/2020   Atherosclerosis of aorta (HCC) 04/06/2020   Weight loss 08/11/2019   Chronic diastolic heart failure (HCC) 04/25/2019   Chronic respiratory failure with hypoxia and hypercapnia (HCC) 04/25/2019   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   Cor pulmonale (chronic) (HCC) 03/19/2018   Chronic obstructive pulmonary disease (HCC) 03/18/2018   Former smoker 02/08/2018    Orientation RESPIRATION BLADDER Height & Weight     Self, Time, Situation, Place  O2 (4L) Continent Weight: 130 lb 1.1 oz (59 kg) Height:  6' (182.9 cm)  BEHAVIORAL SYMPTOMS/MOOD NEUROLOGICAL BOWEL NUTRITION STATUS      Continent Diet (regular)  AMBULATORY STATUS COMMUNICATION OF NEEDS Skin   Supervision Verbally Normal                       Personal Care Assistance Level of Assistance  Bathing, Feeding, Dressing Bathing  Assistance: Independent Feeding assistance: Independent Dressing Assistance: Independent     Functional Limitations Info  Sight, Hearing, Speech Sight Info: Impaired (glasses) Hearing Info: Adequate Speech Info: Adequate    SPECIAL CARE FACTORS FREQUENCY  OT (By licensed OT), PT (By licensed PT)     PT Frequency: 5 x a week OT Frequency: 5 x a week            Contractures Contractures Info: Not present    Additional Factors Info  Code Status, Allergies Code Status Info: full Allergies Info: Erythromycin           Current Medications (05/03/2023):  This is the current hospital active medication list Current Facility-Administered Medications  Medication Dose Route Frequency Provider Last Rate Last Admin   acetaminophen (TYLENOL) tablet 650 mg  650 mg Oral Q6H PRN Lewie Chamber, MD       Or   acetaminophen (TYLENOL) suppository 650 mg  650 mg Rectal Q6H PRN Lewie Chamber, MD       albuterol (PROVENTIL) (2.5 MG/3ML) 0.083% nebulizer solution 2.5 mg  2.5 mg Nebulization Q2H PRN Lewie Chamber, MD   2.5 mg at 04/30/23 1503   arformoterol (BROVANA) nebulizer solution 15 mcg  15 mcg Nebulization BID Marguerita Merles [place], DO   15 mcg at 05/03/23 5621   aspirin EC tablet 81 mg  81 mg Oral Daily Lewie Chamber, MD   81 mg at 05/03/23 0832   budesonide (PULMICORT) nebulizer solution 0.25 mg  0.25 mg Nebulization BID Sheikh, Omair Latif, DO   0.25 mg at  05/03/23 0903   docusate sodium (COLACE) capsule 100 mg  100 mg Oral BID Marguerita Merles [place], DO   100 mg at 05/03/23 0932   enoxaparin (LOVENOX) injection 40 mg  40 mg Subcutaneous Q24H Lewie Chamber, MD   40 mg at 05/02/23 2132   famotidine (PEPCID) tablet 20 mg  20 mg Oral BID Lewie Chamber, MD   20 mg at 05/02/23 2132   feeding supplement (ENSURE ENLIVE / ENSURE PLUS) liquid 237 mL  237 mL Oral BID BM Lewie Chamber, MD   237 mL at 05/03/23 0832   guaiFENesin (MUCINEX) 12 hr tablet 1,200 mg  1,200 mg Oral BID Marguerita Merles  Latif, DO   1,200 mg at 05/02/23 2132   ipratropium-albuterol (DUONEB) 0.5-2.5 (3) MG/3ML nebulizer solution 3 mL  3 mL Nebulization BID Sheikh, Omair Latif, DO       methylPREDNISolone sodium succinate (SOLU-MEDROL) 40 mg/mL injection 40 mg  40 mg Intravenous Q12H Sheikh, Omair [place], DO   40 mg at 05/03/23 0832   ondansetron (ZOFRAN) tablet 4 mg  4 mg Oral Q6H PRN Lewie Chamber, MD       Or   ondansetron [place]) injection 4 mg  4 mg Intravenous Q6H PRN Lewie Chamber, MD       Oral care mouth rinse  15 mL Mouth Rinse PRN Lewie Chamber, MD       rosuvastatin (CRESTOR) tablet 40 mg  40 mg Oral Daily Lewie Chamber, MD   40 mg at 05/03/23 5366   traZODone (DESYREL) tablet 25 mg  25 mg Oral QHS PRN Lewie Chamber, MD   25 mg at 04/29/23 2145     Discharge Medications: Please see discharge summary for a list of discharge medications.  Relevant Imaging Results:  Relevant Lab Results:   Additional Information SSN:[id number]  Valentina Shaggy Tamekia Rotter, LCSW

## 2023-05-03 NOTE — Plan of Care (Signed)

## 2023-05-04 DIAGNOSIS — J441 Chronic obstructive pulmonary disease with (acute) exacerbation: Secondary | ICD-10-CM | POA: Diagnosis not present

## 2023-05-04 DIAGNOSIS — J9611 Chronic respiratory failure with hypoxia: Secondary | ICD-10-CM | POA: Diagnosis not present

## 2023-05-04 DIAGNOSIS — K219 Gastro-esophageal reflux disease without esophagitis: Secondary | ICD-10-CM | POA: Diagnosis not present

## 2023-05-04 DIAGNOSIS — I5032 Chronic diastolic (congestive) heart failure: Secondary | ICD-10-CM | POA: Diagnosis not present

## 2023-05-04 MED ORDER — PREDNISONE 50 MG PO TABS
60.0000 mg | ORAL_TABLET | Freq: Every day | ORAL | Status: DC
  Administered 2023-05-05: 60 mg via ORAL
  Filled 2023-05-04: qty 1

## 2023-05-04 NOTE — Progress Notes (Deleted)
   05/04/23 2100  BiPAP/CPAP/SIPAP  BiPAP/CPAP/SIPAP Pt Type Adult  BiPAP/CPAP/SIPAP V60  Mask Type Full face mask  Mask Size Medium  Set Rate 12 breaths/min  Respiratory Rate 20 breaths/min  IPAP 12 cmH20  EPAP 2 cmH2O  FiO2 (%) 35 %  Minute Ventilation 10.6  Leak 7  Peak Inspiratory Pressure (PIP) 12  Tidal Volume (Vt) 562  Patient Home Equipment No  Auto Titrate No  Press High Alarm 35 cmH2O  Press Low Alarm 5 cmH2O  CPAP/SIPAP surface wiped down Yes  BiPAP/CPAP /SiPAP Vitals  Pulse Rate 93  Resp 20  SpO2 98 %  Bilateral Breath Sounds Clear;Diminished  MEWS Score/Color  MEWS Score 0  MEWS Score Color Anthony Nelson

## 2023-05-04 NOTE — Progress Notes (Signed)
   05/04/23 0253  BiPAP/CPAP/SIPAP  BiPAP/CPAP/SIPAP Pt Type Adult  BiPAP/CPAP/SIPAP V60  Mask Type Full face mask  Mask Size Medium  Set Rate 12 breaths/min  Respiratory Rate 22 breaths/min  IPAP 12 cmH20  EPAP 5 cmH2O  FiO2 (%) 35 %  Minute Ventilation 10  Leak 0  Peak Inspiratory Pressure (PIP) 12  Tidal Volume (Vt) 488  Patient Home Equipment No  Auto Titrate No  Press High Alarm 35 cmH2O  Press Low Alarm 5 cmH2O  BiPAP/CPAP /SiPAP Vitals  Pulse Rate 83  Resp 20  SpO2 98 %  Bilateral Breath Sounds Clear;Diminished  MEWS Score/Color  MEWS Score 0  MEWS Score Color [place] Si

## 2023-05-04 NOTE — Progress Notes (Signed)
   05/04/23 0003  BiPAP/CPAP/SIPAP  $ Non-Invasive Ventilator  Non-Invasive Vent Subsequent  Reason BIPAP/CPAP not in use Other(comment) (Pt still not ready for bipap)  BiPAP/CPAP /SiPAP Vitals  Resp (!) 24  MEWS Score/Color  MEWS Score 1  MEWS Score Color [place] Si

## 2023-05-04 NOTE — Progress Notes (Signed)
PROGRESS NOTE    SUNDEEP DESTIN  GUY:[id number] DOB: 1961/07/02 DOA: 04/28/2023 PCP: Storm Frisk, MD   Brief Narrative:  Mr. Clouse is a 62 yo male with PMH COPD, chronic hypoxia on 2L 02, pulmonary hypertension, GERD, diastolic CHF and other comorbidities who presented with shortness of breath. Symptoms have been ongoing for several days and he began having nonproductive cough and sensation of difficulty clearing sputum but he feels more short winded. Slightly hypoxic on admission, 89% and placed on 3 L.  VBG notable for hypercarbia and pH 7.28.  He was placed on BiPAP and admitted for further workup and monitoring.  He is slowly improving however he continues to desaturate but continues to improve daily.  Will watch him another night and continue IV steroids and taper down to 40 twice daily.  Repeat chest x-ray this a.m. showed no active disease and hematuria on O2 screen done showed that he needs at least 4 L while ambulating.  Patient was to be discharged back to the service center house today however was informed that he is not able to return.  Presently does not have a safe discharge disposition and the TOC team is trying to find a safe discharge disposition for him  Assessment and Plan:  COPD with Acute Exacerbation (HCC), improving -Mainly dyspnea and tight breath sounds; nonproductive cough -Desaturates and when working with PT the patient dropped to 86% just getting to the edge of the bed. Dropped to 88% on 4lnc with ambulation. -Added guaifenesin 1200 ohms p.o. twice daily and continue spirometry and flutter valve -Continue with breathing treatments with DuoNeb 3 mL 3 times daily and add Brovana 15 mcg neb twice daily and budesonide 0.25 mg p.o. twice daily; also has albuterol 2.5 mg Neb every 2 as needed for wheezing -IV Steroids now stopped and changed to po Prednisone Taper now -Continue BiPAP as needed and at night; may need to discuss with pulmonary about getting the patient  qualified for trilogy BiPAP and discuss with Pulmonary -Continuous pulse oximetry and maintain O2 saturations greater than 90% -Continue supplemental oxygen via nasal cannula and wean to home regimen -Repeat chest x-ray in the a.m. this a.m. showed no active disease with ambulatory home O2 screen done and he did desaturate on 2 L to 84% but came back up to 90% on 4 Liters    Homeless -Resides at a shelter.  They are requesting PT eval prior to returning and PT recommending no Follow up and now requesting OT. -TOC to be involved; Plan was for the patient to Return to the [facility] however I was informed by the caseworker that the servant Center has reported concerns with the patient's ability to care for himself and have now told the caseworker that the patient not able to return to the [facility]  Currently patient does not have a safe discharge disposition given this   GERD/GI Prophylaxis -Continue Famotidine 20 mg po BID   Hyperlipidemia -Continue Rosuvastatin 40 mg po Daily    Chronic Respiratory Failure with Hypoxia and Hypercapnia (HCC) -Chronic 2L at home SpO2: (!) 88 % O2 Flow Rate (L/min): 4 L/min FiO2 (%): 35 % -Placed on 3L initially then was placed on BiPAP and now stable off BiPAP but we're using it at night.  He states that he has a BiPAP at home and will continue this at discharge -Patient desaturated on ambulatory home O2 screen and had to be placed on 4 L while ambulating he may need  to increase his oxygen while ambulating at discharge   Chronic Diastolic Heart Failure (HCC) -No evidence of acute decompensation of heart failure -Last TTE 04/18/2022 with LVEF 50-55%, LV with no regional wall trauma maladies, RV mildly enlarged, trivial MR.   .Strict I's and O's and Daily Weights;  Intake/Output Summary (Last 24 hours) at 05/04/2023 1443 Last data filed at 05/04/2023 1345 Gross per 24 hour  Intake 100 ml  Output 900 ml  Net -800 ml  -Only on lasix PRN at home;  will resume if needed -Continue to monitor for signs or symptoms of volume overload  Macrocytic Anemia -Hgb/Hct Trend: Recent Labs  Lab 04/09/23 2246 04/10/23 0607 04/28/23 1505 04/28/23 1506 04/29/23 0633 04/30/23 0700 05/02/23 0407  HGB 11.8* 11.5* 12.0* 13.6 10.9* 10.5* 11.5*  HCT 39.7 38.3* 40.7 40.0 36.2* 33.9* 36.8*  MCV 103.9* 103.2* 106.3*  --  104.6* 102.7* 101.7*  -Checked Anemia Panel showed an iron level 54, UIBC 258, TIBC 312, saturation ratios of 17%, ferritin level 142, folate of 6.4, vitamin B12 level 488 -Continue to Monitor for S/Sx of Bleeding; No overt bleeding noted -Repeat CBC in the AM   Thrombocytopenia -Platelet Count Trend: Recent Labs  Lab 04/09/23 1429 04/09/23 2246 04/10/23 0607 04/28/23 1505 04/29/23 0633 04/30/23 0700 05/02/23 0407  PLT 149* 139* 144* 159 141* 148* 159  -Continue to Monitor for S/Sx of Bleeding; No overt bleeding noted  -Repeat CBC in the AM   Severe Protein Calorie Malnutrition Nutrition Status: Nutrition Problem: Severe Malnutrition Etiology: chronic illness (COPD) Signs/Symptoms: severe fat depletion, severe muscle depletion Interventions: Refer to RD note for recommendations, Ensure Enlive (each supplement provides 350kcal and 20 grams of protein), MVI, Liberalize Diet  Hypoalbuminemia -Patient's Albumin Trend: Recent Labs  Lab 04/10/23 0607 04/28/23 1505 05/02/23 0407  ALBUMIN 3.2* 3.6 3.4*  -Continue to Monitor and Trend and repeat CMP in the AM   DVT prophylaxis: enoxaparin (LOVENOX) injection 40 mg Start: 04/28/23 2200 SCDs Start: 04/28/23 1706    Code Status: Full Code Family Communication: No family present at bedside  Disposition Plan:  Level of care: Progressive Status is: Inpatient Remains inpatient appropriate because: Needs safe discharge disposition   Consultants:  None  Procedures:  As delineated as above  Antimicrobials:  Anti-infectives (From admission, onward)    Start      Dose/Rate Route Frequency Ordered Stop   04/28/23 1545  cefTRIAXone (ROCEPHIN) 1 g in sodium chloride 0.9 % 100 mL IVPB        1 g 200 mL/hr over 30 Minutes Intravenous  Once 04/28/23 1532 04/28/23 1606   04/28/23 1545  doxycycline (VIBRA-TABS) tablet 100 mg        100 mg Oral  Once 04/28/23 1532 04/28/23 1535       Subjective: Seen and examined at bedside and he continues to be frustrated.  Continues to have some anxiety but denies any nausea or vomiting.  Thinks his breathing is basically his baseline.  No other concerns or complaints at this time.  Objective: Vitals:   05/04/23 0837 05/04/23 0907 05/04/23 1234 05/04/23 1427  BP: 102/75  94/66   Pulse: 82  86   Resp: 18  16   Temp: 97.8 F (36.6 C)  97.8 F (36.6 C)   TempSrc: Oral  Oral   SpO2: 98% 99% 94% (!) 88%  Weight:      Height:        Intake/Output Summary (Last 24 hours) at 05/04/2023 1446 Last data filed  at 05/04/2023 1345 Gross per 24 hour  Intake 100 ml  Output 900 ml  Net -800 ml   Filed Weights   04/28/23 1440  Weight: 59 kg   Examination: Physical Exam:  Constitutional: Thin African-American male who appears anxious Respiratory: Diminished to auscultation bilaterally with some coarse breath sounds, no wheezing, rales, rhonchi or crackles. Normal respiratory effort and patient is not tachypenic. No accessory muscle use.  Unlabored breathing and is wearing supplemental oxygen nasal cannula Cardiovascular: RRR, no murmurs / rubs / gallops. S1 and S2 auscultated. No extremity edema. Abdomen: Soft, non-tender, non-distended. Bowel sounds positive.  GU: Deferred. Musculoskeletal: No clubbing / cyanosis of digits/nails. No joint deformity upper and lower extremities.  Skin: No rashes, lesions, ulcers limited skin evaluation. No induration; Warm and dry.  Neurologic: CN 2-12 grossly intact with no focal deficits. Romberg sign and cerebellar reflexes not assessed.  Psychiatric: Normal judgment and insight.  Alert and oriented x 3. Appears anxious  Data Reviewed: I have personally reviewed following labs and imaging studies  CBC: Recent Labs  Lab 04/28/23 1505 04/28/23 1506 04/29/23 0633 04/30/23 0700 05/02/23 0407  WBC 7.4  --  5.5 6.8 8.1  NEUTROABS 6.2  --   --   --  7.7  HGB 12.0* 13.6 10.9* 10.5* 11.5*  HCT 40.7 40.0 36.2* 33.9* 36.8*  MCV 106.3*  --  104.6* 102.7* 101.7*  PLT 159  --  141* 148* 159   Basic Metabolic Panel: Recent Labs  Lab 04/28/23 1505 04/28/23 1506 04/29/23 0345 04/30/23 0700 05/02/23 0407  NA 141 140 139 142 135  K 3.1* 3.6 4.5 3.8 4.2  CL 91* 88* 94* 93* 89*  CO2 41*  --  33* 40* 37*  GLUCOSE 144* 144* 97 88 132*  BUN 16 21 13 18 17   CREATININE 0.49* 0.80 0.51* 0.88 0.50*  CALCIUM 8.9  --  8.7* 8.7* 8.7*  MG 1.8  --   --   --  2.1  PHOS  --   --   --   --  4.2   GFR: Estimated Creatinine Clearance: 78.9 mL/min (A) (by C-G formula based on SCr of 0.5 mg/dL (L)). Liver Function Tests: Recent Labs  Lab 04/28/23 1505 05/02/23 0407  AST 18 14*  ALT 19 18  ALKPHOS 48 45  BILITOT 0.7 0.5  PROT 7.1 6.7  ALBUMIN 3.6 3.4*   No results for input(s): "LIPASE", "AMYLASE" in the last 168 hours. No results for input(s): "AMMONIA" in the last 168 hours. Coagulation Profile: No results for input(s): "INR", "PROTIME" in the last 168 hours. Cardiac Enzymes: No results for input(s): "CKTOTAL", "CKMB", "CKMBINDEX", "TROPONINI" in the last 168 hours. BNP (last 3 results) No results for input(s): "PROBNP" in the last 8760 hours. HbA1C: No results for input(s): "HGBA1C" in the last 72 hours. CBG: No results for input(s): "GLUCAP" in the last 168 hours. Lipid Profile: No results for input(s): "CHOL", "HDL", "LDLCALC", "TRIG", "CHOLHDL", "LDLDIRECT" in the last 72 hours. Thyroid Function Tests: No results for input(s): "TSH", "T4TOTAL", "FREET4", "T3FREE", "THYROIDAB" in the last 72 hours. Anemia Panel: Recent Labs    05/03/23 0414  VITAMINB12 488   FOLATE 6.4  FERRITIN 142  TIBC 312  IRON 54  RETICCTPCT 1.4   Sepsis Labs: No results for input(s): "PROCALCITON", "LATICACIDVEN" in the last 168 hours.  Recent Results (from the past 240 hour(s))  Resp panel by RT-PCR (RSV, Flu A&B, Covid) Anterior Nasal Swab     Status: None  Collection Time: 04/28/23  3:05 PM   Specimen: Anterior Nasal Swab  Result Value Ref Range Status   SARS Coronavirus 2 by RT PCR NEGATIVE NEGATIVE Final    Comment: (NOTE) SARS-CoV-2 target nucleic acids are NOT DETECTED.  The SARS-CoV-2 RNA is generally detectable in upper respiratory specimens during the acute phase of infection. The lowest concentration of SARS-CoV-2 viral copies this assay can detect is 138 copies/mL. A negative result does not preclude SARS-Cov-2 infection and should not be used as the sole basis for treatment or other patient management decisions. A negative result may occur with  improper specimen collection/handling, submission of specimen other than nasopharyngeal swab, presence of viral mutation(s) within the areas targeted by this assay, and inadequate number of viral copies(<138 copies/mL). A negative result must be combined with clinical observations, patient history, and epidemiological information. The expected result is Negative.  Fact Sheet for Patients:  BloggerCourse.com  Fact Sheet for Healthcare Providers:  SeriousBroker.it  This test is no t yet approved or cleared by the [place] FDA and  has been authorized for detection and/or diagnosis of SARS-CoV-2 by FDA under an Emergency Use Authorization (EUA). This EUA will remain  in effect (meaning this test can be used) for the duration of the COVID-19 declaration under Section 564(b)(1) of the Act, 21 U.S.C.section 360bbb-3(b)(1), unless the authorization is terminated  or revoked sooner.       Influenza A by PCR NEGATIVE NEGATIVE Final   Influenza B by  PCR NEGATIVE NEGATIVE Final    Comment: (NOTE) The Xpert Xpress SARS-CoV-2/FLU/RSV plus assay is intended as an aid in the diagnosis of influenza from Nasopharyngeal swab specimens and should not be used as a sole basis for treatment. Nasal washings and aspirates are unacceptable for Xpert Xpress SARS-CoV-2/FLU/RSV testing.  Fact Sheet for Patients: BloggerCourse.com  Fact Sheet for Healthcare Providers: SeriousBroker.it  This test is not yet approved or cleared by the [place] FDA and has been authorized for detection and/or diagnosis of SARS-CoV-2 by FDA under an Emergency Use Authorization (EUA). This EUA will remain in effect (meaning this test can be used) for the duration of the COVID-19 declaration under Section 564(b)(1) of the Act, 21 U.S.C. section 360bbb-3(b)(1), unless the authorization is terminated or revoked.     Resp Syncytial Virus by PCR NEGATIVE NEGATIVE Final    Comment: (NOTE) Fact Sheet for Patients: BloggerCourse.com  Fact Sheet for Healthcare Providers: SeriousBroker.it  This test is not yet approved or cleared by the [place] FDA and has been authorized for detection and/or diagnosis of SARS-CoV-2 by FDA under an Emergency Use Authorization (EUA). This EUA will remain in effect (meaning this test can be used) for the duration of the COVID-19 declaration under Section 564(b)(1) of the Act, 21 U.S.C. section 360bbb-3(b)(1), unless the authorization is terminated or revoked.  Performed at [facility], [address] [place], [place] [address]   Respiratory (~20 pathogens) panel by PCR     Status: None   Collection Time: 04/28/23  3:05 PM   Specimen: Nasopharyngeal Swab; Respiratory  Result Value Ref Range Status   Adenovirus NOT DETECTED NOT DETECTED Final   Coronavirus 229E NOT DETECTED NOT DETECTED Final    Comment:  (NOTE) The Coronavirus on the Respiratory Panel, DOES NOT test for the novel  Coronavirus (2019 nCoV)    Coronavirus HKU1 NOT DETECTED NOT DETECTED Final   Coronavirus NL63 NOT DETECTED NOT DETECTED Final   Coronavirus OC43 NOT DETECTED NOT DETECTED Final  Metapneumovirus NOT DETECTED NOT DETECTED Final   Rhinovirus / Enterovirus NOT DETECTED NOT DETECTED Final   Influenza A NOT DETECTED NOT DETECTED Final   Influenza B NOT DETECTED NOT DETECTED Final   Parainfluenza Virus 1 NOT DETECTED NOT DETECTED Final   Parainfluenza Virus 2 NOT DETECTED NOT DETECTED Final   Parainfluenza Virus 3 NOT DETECTED NOT DETECTED Final   Parainfluenza Virus 4 NOT DETECTED NOT DETECTED Final   Respiratory Syncytial Virus NOT DETECTED NOT DETECTED Final   Bordetella pertussis NOT DETECTED NOT DETECTED Final   Bordetella Parapertussis NOT DETECTED NOT DETECTED Final   Chlamydophila pneumoniae NOT DETECTED NOT DETECTED Final   Mycoplasma pneumoniae NOT DETECTED NOT DETECTED Final    Comment: Performed at [facility], [address], [place], [place] [address]  MRSA Next Gen by PCR, Nasal     Status: None   Collection Time: 04/28/23  8:06 PM   Specimen: Nasal Mucosa; Nasal Swab  Result Value Ref Range Status   MRSA by PCR Next Gen NOT DETECTED NOT DETECTED Final    Comment: (NOTE) The GeneXpert MRSA Assay (FDA approved for NASAL specimens only), is one component of a comprehensive MRSA colonization surveillance program. It is not intended to diagnose MRSA infection nor to guide or monitor treatment for MRSA infections. Test performance is not FDA approved in patients less than 43 years old. Performed at [facility], [address], [place], [place] [address]     Radiology Studies: No results found.  Scheduled Meds:  arformoterol  15 mcg Nebulization BID   aspirin EC  81 mg Oral Daily   budesonide (PULMICORT) nebulizer solution  0.25 mg Nebulization BID   docusate sodium   100 mg Oral BID   enoxaparin (LOVENOX) injection  40 mg Subcutaneous Q24H   famotidine  20 mg Oral BID   feeding supplement  237 mL Oral BID BM   guaiFENesin  1,200 mg Oral BID   ipratropium-albuterol  3 mL Nebulization BID   [START ON 05/05/2023] predniSONE  60 mg Oral Q breakfast   rosuvastatin  40 mg Oral Daily   Continuous Infusions:   LOS: 6 days   Marguerita Merles, DO [facility] Available via Epic secure chat 7am-7pm After these hours, please refer to coverage provider listed on [email or web address] 05/04/2023, 2:46 PM

## 2023-05-04 NOTE — Progress Notes (Addendum)
Mobility Specialist - Progress Note   05/04/23 1427  Oxygen Therapy  SpO2 (!) 88 %  O2 Device Nasal Cannula  O2 Flow Rate (L/min) 4 L/min  Patient Activity (if Appropriate) Ambulating  Mobility  Activity Ambulated with assistance in hallway  Level of Assistance Modified independent, requires aide device or extra time  Assistive Device Front wheel walker  Distance Ambulated (ft) 350 ft  Activity Response Tolerated well  Mobility Referral Yes  $Mobility charge 1 Mobility  Mobility Specialist Start Time (ACUTE ONLY) 0121  Mobility Specialist Stop Time (ACUTE ONLY) 0137  Mobility Specialist Time Calculation (min) (ACUTE ONLY) 16 min   Pt received in bed and agreeable to mobility. During session, pt SpO2 dropped to 88%. Encouraged pursed lip breaths bringing SpO2 back up to 93%. Pt took x3 standing rest breaks d/t SOB. No complaints during session. Pt to bed after session with all needs met.      Pre-mobility: 94% SpO2 (4L [place]) During mobility: 110 HR, 88-93% SpO2 (4L [place]) Post-mobility: 98 HR  [facility] Paediatric nurse

## 2023-05-04 NOTE — Progress Notes (Signed)
   05/04/23 2100  BiPAP/CPAP/SIPAP  BiPAP/CPAP/SIPAP Pt Type Adult  BiPAP/CPAP/SIPAP V60  Mask Type Full face mask  Mask Size Medium  Set Rate 12 breaths/min  Respiratory Rate 20 breaths/min  IPAP 12 cmH20  EPAP 6 cmH2O  FiO2 (%) 35 %  Minute Ventilation 10.6  Leak 7  Peak Inspiratory Pressure (PIP) 12  Tidal Volume (Vt) 562  Patient Home Equipment No  Auto Titrate No  Press High Alarm 35 cmH2O  Press Low Alarm 5 cmH2O  CPAP/SIPAP surface wiped down Yes  BiPAP/CPAP /SiPAP Vitals  Pulse Rate 93  Resp 20  SpO2 98 %  Bilateral Breath Sounds Clear;Diminished  MEWS Score/Color  MEWS Score 0  MEWS Score Color Anthony Nelson

## 2023-05-05 DIAGNOSIS — K219 Gastro-esophageal reflux disease without esophagitis: Secondary | ICD-10-CM | POA: Diagnosis not present

## 2023-05-05 DIAGNOSIS — I5032 Chronic diastolic (congestive) heart failure: Secondary | ICD-10-CM | POA: Diagnosis not present

## 2023-05-05 DIAGNOSIS — J9611 Chronic respiratory failure with hypoxia: Secondary | ICD-10-CM | POA: Diagnosis not present

## 2023-05-05 DIAGNOSIS — J441 Chronic obstructive pulmonary disease with (acute) exacerbation: Secondary | ICD-10-CM | POA: Diagnosis not present

## 2023-05-05 LAB — CBC WITH DIFFERENTIAL/PLATELET
Abs Immature Granulocytes: 0.03 10*3/uL (ref 0.00–0.07)
Basophils Absolute: 0 10*3/uL (ref 0.0–0.1)
Basophils Relative: 0 %
Eosinophils Absolute: 0 10*3/uL (ref 0.0–0.5)
Eosinophils Relative: 0 %
HCT: 38.2 % — ABNORMAL LOW (ref 39.0–52.0)
Hemoglobin: 11.5 g/dL — ABNORMAL LOW (ref 13.0–17.0)
Immature Granulocytes: 0 %
Lymphocytes Relative: 22 %
Lymphs Abs: 1.9 10*3/uL (ref 0.7–4.0)
MCH: 31.7 pg (ref 26.0–34.0)
MCHC: 30.1 g/dL (ref 30.0–36.0)
MCV: 105.2 fL — ABNORMAL HIGH (ref 80.0–100.0)
Monocytes Absolute: 0.6 10*3/uL (ref 0.1–1.0)
Monocytes Relative: 7 %
Neutro Abs: 5.8 10*3/uL (ref 1.7–7.7)
Neutrophils Relative %: 71 %
Platelets: 203 10*3/uL (ref 150–400)
RBC: 3.63 MIL/uL — ABNORMAL LOW (ref 4.22–5.81)
RDW: 12.5 % (ref 11.5–15.5)
WBC: 8.3 10*3/uL (ref 4.0–10.5)
nRBC: 0 % (ref 0.0–0.2)

## 2023-05-05 LAB — COMPREHENSIVE METABOLIC PANEL
ALT: 28 U/L (ref 0–44)
AST: 18 U/L (ref 15–41)
Albumin: 3.3 g/dL — ABNORMAL LOW (ref 3.5–5.0)
Alkaline Phosphatase: 43 U/L (ref 38–126)
Anion gap: 8 (ref 5–15)
BUN: 22 mg/dL (ref 8–23)
CO2: 42 mmol/L — ABNORMAL HIGH (ref 22–32)
Calcium: 8.9 mg/dL (ref 8.9–10.3)
Chloride: 91 mmol/L — ABNORMAL LOW (ref 98–111)
Creatinine, Ser: 0.54 mg/dL — ABNORMAL LOW (ref 0.61–1.24)
GFR, Estimated: >60 mL/min (ref >60)
Glucose, Bld: 92 mg/dL (ref 70–99)
Potassium: 4.1 mmol/L (ref 3.5–5.1)
Sodium: 141 mmol/L (ref 135–145)
Total Bilirubin: 0.4 mg/dL (ref 0.3–1.2)
Total Protein: 6.5 g/dL (ref 6.5–8.1)

## 2023-05-05 LAB — PHOSPHORUS: Phosphorus: 4.4 mg/dL (ref 2.5–4.6)

## 2023-05-05 LAB — MAGNESIUM: Magnesium: 2.1 mg/dL (ref 1.7–2.4)

## 2023-05-05 MED ORDER — PREDNISONE 50 MG PO TABS
50.0000 mg | ORAL_TABLET | Freq: Every day | ORAL | Status: DC
  Administered 2023-05-06: 50 mg via ORAL
  Filled 2023-05-05: qty 1

## 2023-05-05 NOTE — Progress Notes (Signed)
Physical Therapy Treatment Patient Details Name: Anthony Nelson MRN: [id number] DOB: 1961-02-11 Today's Date: 05/05/2023   History of Present Illness 62 yo male admitted with COPD exac. Hx of COPD-2L baseline, HF, pulm HTN    PT Comments  Pt agreeable to working with therapy.  O2: 98% on 3L at rest, 86% 3L amb/89-90% 4L amb. Improved activity tolerance on today compared to previous session. Pt still has higher O2 requirement (baseline 2L). Will continue to follow during hospital stay. Possible plan for ST rehab.     If plan is discharge home, recommend the following: Assist for transportation;Help with stairs or ramp for entrance;A little help with bathing/dressing/bathroom   Can travel by private vehicle        Equipment Recommendations  None recommended by PT    Recommendations for Other Services       Precautions / Restrictions Precautions Precaution Comments: monitor O2 Restrictions Weight Bearing Restrictions: No     Mobility  Bed Mobility Overal bed mobility: Modified Independent             General bed mobility comments: Supv for safety, proper operation of rollator.    Transfers Overall transfer level: Needs assistance Equipment used: Rollator (4 wheels) Transfers: Sit to/from Stand Sit to Stand: Supervision           General transfer comment: Supv for safety, proper operation of rollator.    Ambulation/Gait Ambulation/Gait assistance: Supervision Gait Distance (Feet): 350 Feet Assistive device: Rollator (4 wheels) Gait Pattern/deviations: Step-through pattern, Decreased stride length       General Gait Details: O2 86% on 3L, 89-90% on 4L. Cues for pursed lip breathing.   Stairs             Wheelchair Mobility     Tilt Bed    Modified Rankin (Stroke Patients Only)       Balance Overall balance assessment: Mild deficits observed, not formally tested                                          Cognition  Arousal: Alert Behavior During Therapy: WFL for tasks assessed/performed Overall Cognitive Status: Within Functional Limits for tasks assessed                                          Exercises      General Comments        Pertinent Vitals/Pain Pain Assessment Pain Assessment: No/denies pain    Home Living                          Prior Function            PT Goals (current goals can now be found in the care plan section) Progress towards PT goals: Progressing toward goals    Frequency    Min 1X/week      PT Plan      Co-evaluation              AM-PAC PT "6 Clicks" Mobility   Outcome Measure  Help needed turning from your back to your side while in a flat bed without using bedrails?: None Help needed moving from lying on your back to sitting on the side of a flat bed without  using bedrails?: None Help needed moving to and from a bed to a chair (including a wheelchair)?: None Help needed standing up from a chair using your arms (e.g., wheelchair or bedside chair)?: None Help needed to walk in hospital room?: A Little Help needed climbing 3-5 steps with a railing? : A Little 6 Click Score: 22    End of Session Equipment Utilized During Treatment: Oxygen Activity Tolerance: Patient tolerated treatment well Patient left: in bed;with call bell/phone within reach   PT Visit Diagnosis: Difficulty in walking, not elsewhere classified (R26.2)     Time: 7425-9563 PT Time Calculation (min) (ACUTE ONLY): 24 min  Charges:    $Gait Training: 23-37 mins PT General Charges $$ ACUTE PT VISIT: 1 Visit                        Faye Ramsay, PT Acute Rehabilitation  Office: [phone]

## 2023-05-05 NOTE — Progress Notes (Signed)
PROGRESS NOTE    Anthony Nelson  WGN:[id number] DOB: 1960-07-10 DOA: 04/28/2023 PCP: Storm Frisk, MD   Brief Narrative:  Anthony Nelson is a 62 yo male with PMH COPD, chronic hypoxia on 2L 02, pulmonary hypertension, GERD, diastolic CHF and other comorbidities who presented with shortness of breath. Symptoms have been ongoing for several days and he began having nonproductive cough and sensation of difficulty clearing sputum but he feels more short winded. Slightly hypoxic on admission, 89% and placed on 3 L.  VBG notable for hypercarbia and pH 7.28.  He was placed on BiPAP and admitted for further workup and monitoring.  He is slowly improving however he continues to desaturate but continues to improve daily.  Will watch him another night and continue IV steroids and taper down to 40 twice daily.  Repeat chest x-ray this a.m. showed no active disease and hematuria on O2 screen done showed that he needs at least 4 L while ambulating.  Patient was to be discharged back to the [facility] center [facility] today however was informed that he is not able to return.  Presently does not have a safe discharge disposition and the TOC team is trying to find a safe discharge disposition for him  Assessment and Plan:  COPD with Acute Exacerbation (HCC), improving -Mainly dyspnea and tight breath sounds; nonproductive cough -Desaturates and when working with PT the patient dropped to 86% just getting to the edge of the bed. Dropped to 88% on 4lnc with ambulation. -Added guaifenesin 1200 ohms p.o. twice daily and continue spirometry and flutter valve -Continue with breathing treatments with DuoNeb 3 mL 3 times daily and add Brovana 15 mcg neb twice daily and budesonide 0.25 mg p.o. twice daily; also has albuterol 2.5 mg Neb every 2 as needed for wheezing -IV Steroids now stopped and changed to po Prednisone Taper now -Continue BiPAP as needed and at night; uses BiPAP at Home SpO2: 99 % O2 Flow Rate (L/min): 3  L/min FiO2 (%): 35 % -Continuous pulse oximetry and maintain O2 saturations greater than 90% -Continue supplemental oxygen via nasal cannula and wean to home regimen -Repeat chest x-ray showed no active disease with ambulatory home O2 screen done and he did desaturate on 2 L to 84% but came back up to 90% on 4 Liters    Homeless -Used to Resides at a shelter.  They were requesting PT eval prior to returning and initially they recommended no follow up but are now recommending SNF. -TOC to be involved; Plan was for the patient to Return to the [facility] however he was informed by the caseworker that the [facility] has reported concerns with the patient's ability to care for himself and have now told the caseworker that the patient not able to return to the [facility].  Currently patient does not have a safe discharge disposition but PT/OT now recommending SNF   GERD/GI Prophylaxis -Continue Famotidine 20 mg po BID   Hyperlipidemia -Continue Rosuvastatin 40 mg po Daily    Chronic Respiratory Failure with Hypoxia and Hypercapnia (HCC) -Chronic 2L at home SpO2: 99 % O2 Flow Rate (L/min): 3 L/min FiO2 (%): 35 % -Placed on 3L initially then was placed on BiPAP and now stable off BiPAP but we're using it at night.  He states that he has a BiPAP at home and will continue this at discharge -Patient desaturated on ambulatory home O2 screen and had to be placed on 4 L while ambulating he may need  to increase his oxygen while ambulating at discharge   Chronic Diastolic Heart Failure (HCC) -No evidence of acute decompensation of heart failure -Last TTE 04/18/2022 with LVEF 50-55%, LV with no regional wall trauma maladies, RV mildly enlarged, trivial MR.   .Strict I's and O's and Daily Weights;  Intake/Output Summary (Last 24 hours) at 05/05/2023 1418 Last data filed at 05/05/2023 0930 Gross per 24 hour  Intake --  Output 475 ml  Net -475 ml  -Only on lasix PRN at home; will resume if  needed -Continue to monitor for signs or symptoms of volume overload  Macrocytic Anemia -Hgb/Hct Trend: Recent Labs  Lab 04/10/23 0607 04/28/23 1505 04/28/23 1506 04/29/23 0633 04/30/23 0700 05/02/23 0407 05/05/23 0417  HGB 11.5* 12.0* 13.6 10.9* 10.5* 11.5* 11.5*  HCT 38.3* 40.7 40.0 36.2* 33.9* 36.8* 38.2*  MCV 103.2* 106.3*  --  104.6* 102.7* 101.7* 105.2*  -Checked Anemia Panel showed an iron level 54, UIBC 258, TIBC 312, saturation ratios of 17%, ferritin level 142, folate of 6.4, vitamin B12 level 488 -Continue to Monitor for S/Sx of Bleeding; No overt bleeding noted -Repeat CBC in the AM   Thrombocytopenia -Platelet Count Trend: Recent Labs  Lab 04/09/23 2246 04/10/23 0607 04/28/23 1505 04/29/23 0633 04/30/23 0700 05/02/23 0407 05/05/23 0417  PLT 139* 144* 159 141* 148* 159 203  -Continue to Monitor for S/Sx of Bleeding; No overt bleeding noted  -Repeat CBC in the AM   Severe Protein Calorie Malnutrition Nutrition Status: Nutrition Problem: Severe Malnutrition Etiology: chronic illness (COPD) Signs/Symptoms: severe fat depletion, severe muscle depletion Interventions: Refer to RD note for recommendations, Ensure Enlive (each supplement provides 350kcal and 20 grams of protein), MVI, Liberalize Diet  Hypoalbuminemia -Patient's Albumin Trend: Recent Labs  Lab 04/10/23 0607 04/28/23 1505 05/02/23 0407 05/05/23 0417  ALBUMIN 3.2* 3.6 3.4* 3.3*  -Continue to Monitor and Trend and repeat CMP in the AM   DVT prophylaxis: enoxaparin (LOVENOX) injection 40 mg Start: 04/28/23 2200 SCDs Start: 04/28/23 1706    Code Status: Full Code Family Communication: No family present at bedside  Disposition Plan:  Level of care: Progressive Status is: Inpatient Remains inpatient appropriate because: Needs a safe discharge Disposition   Consultants:  None  Procedures:  As delineated as above  Antimicrobials:  Anti-infectives (From admission, onward)    Start      Dose/Rate Route Frequency Ordered Stop   04/28/23 1545  cefTRIAXone (ROCEPHIN) 1 g in sodium chloride 0.9 % 100 mL IVPB        1 g 200 mL/hr over 30 Minutes Intravenous  Once 04/28/23 1532 04/28/23 1606   04/28/23 1545  doxycycline (VIBRA-TABS) tablet 100 mg        100 mg Oral  Once 04/28/23 1532 04/28/23 1535       Subjective: Seen and examined at bedside and he is doing okay today.  About to ambulate with the physical therapist.  No nausea or vomiting.  Denies any other concerns or complaints this time.  Objective: Vitals:   05/05/23 0404 05/05/23 0906 05/05/23 0920 05/05/23 1309  BP: 102/70  (!) 91/58 102/72  Pulse: 72  81 87  Resp:   18 20  Temp: 97.8 F (36.6 C)  97.9 F (36.6 C) 97.7 F (36.5 C)  TempSrc: Oral  Oral Oral  SpO2: 100% 99% 100% 99%  Weight:      Height:        Intake/Output Summary (Last 24 hours) at 05/05/2023 1418 Last data filed at  05/05/2023 0930 Gross per 24 hour  Intake --  Output 475 ml  Net -475 ml   Filed Weights   04/28/23 1440  Weight: 59 kg   Examination: Physical Exam:  Constitutional: Thin African-American male in no acute distress, Respiratory: Diminished to auscultation bilaterally with some coarse breath sounds, no wheezing, rales, rhonchi or crackles. Normal respiratory effort and patient is not tachypenic. No accessory muscle use.  Unlabored breathing Cardiovascular: RRR, no murmurs / rubs / gallops. S1 and S2 auscultated. No extremity edema.  Abdomen: Soft, non-tender, non-distended. Bowel sounds positive.  GU: Deferred. Musculoskeletal: No clubbing / cyanosis of digits/nails. No joint deformity upper and lower extremities.  Skin: No rashes, lesions, ulcers on a limited skin evaluation. No induration; Warm and dry.  Neurologic: CN 2-12 grossly intact with no focal deficits. Romberg sign and cerebellar reflexes not assessed.  Psychiatric: Normal judgment and insight. Alert and oriented x 3. Normal mood and appropriate affect.    Data Reviewed: I have personally reviewed following labs and imaging studies  CBC: Recent Labs  Lab 04/29/23 0633 04/30/23 0700 05/02/23 0407 05/05/23 0417  WBC 5.5 6.8 8.1 8.3  NEUTROABS  --   --  7.7 5.8  HGB 10.9* 10.5* 11.5* 11.5*  HCT 36.2* 33.9* 36.8* 38.2*  MCV 104.6* 102.7* 101.7* 105.2*  PLT 141* 148* 159 203   Basic Metabolic Panel: Recent Labs  Lab 04/29/23 0345 04/30/23 0700 05/02/23 0407 05/05/23 0417  NA 139 142 135 141  K 4.5 3.8 4.2 4.1  CL 94* 93* 89* 91*  CO2 33* 40* 37* 42*  GLUCOSE 97 88 132* 92  BUN 13 18 17 22   CREATININE 0.51* 0.88 0.50* 0.54*  CALCIUM 8.7* 8.7* 8.7* 8.9  MG  --   --  2.1 2.1  PHOS  --   --  4.2 4.4   GFR: Estimated Creatinine Clearance: 78.9 mL/min (A) (by C-G formula based on SCr of 0.54 mg/dL (L)). Liver Function Tests: Recent Labs  Lab 05/02/23 0407 05/05/23 0417  AST 14* 18  ALT 18 28  ALKPHOS 45 43  BILITOT 0.5 0.4  PROT 6.7 6.5  ALBUMIN 3.4* 3.3*   No results for input(s): "LIPASE", "AMYLASE" in the last 168 hours. No results for input(s): "AMMONIA" in the last 168 hours. Coagulation Profile: No results for input(s): "INR", "PROTIME" in the last 168 hours. Cardiac Enzymes: No results for input(s): "CKTOTAL", "CKMB", "CKMBINDEX", "TROPONINI" in the last 168 hours. BNP (last 3 results) No results for input(s): "PROBNP" in the last 8760 hours. HbA1C: No results for input(s): "HGBA1C" in the last 72 hours. CBG: No results for input(s): "GLUCAP" in the last 168 hours. Lipid Profile: No results for input(s): "CHOL", "HDL", "LDLCALC", "TRIG", "CHOLHDL", "LDLDIRECT" in the last 72 hours. Thyroid Function Tests: No results for input(s): "TSH", "T4TOTAL", "FREET4", "T3FREE", "THYROIDAB" in the last 72 hours. Anemia Panel: Recent Labs    05/03/23 0414  VITAMINB12 488  FOLATE 6.4  FERRITIN 142  TIBC 312  IRON 54  RETICCTPCT 1.4   Sepsis Labs: No results for input(s): "PROCALCITON", "LATICACIDVEN" in the  last 168 hours.  Recent Results (from the past 240 hour(s))  Resp panel by RT-PCR (RSV, Flu A&B, Covid) Anterior Nasal Swab     Status: None   Collection Time: 04/28/23  3:05 PM   Specimen: Anterior Nasal Swab  Result Value Ref Range Status   SARS Coronavirus 2 by RT PCR NEGATIVE NEGATIVE Final    Comment: (NOTE) SARS-CoV-2 target nucleic acids  are NOT DETECTED.  The SARS-CoV-2 RNA is generally detectable in upper respiratory specimens during the acute phase of infection. The lowest concentration of SARS-CoV-2 viral copies this assay can detect is 138 copies/mL. A negative result does not preclude SARS-Cov-2 infection and should not be used as the sole basis for treatment or other patient management decisions. A negative result may occur with  improper specimen collection/handling, submission of specimen other than nasopharyngeal swab, presence of viral mutation(s) within the areas targeted by this assay, and inadequate number of viral copies(<138 copies/mL). A negative result must be combined with clinical observations, patient history, and epidemiological information. The expected result is Negative.  Fact Sheet for Patients:  BloggerCourse.com  Fact Sheet for Healthcare Providers:  SeriousBroker.it  This test is no t yet approved or cleared by the [place] FDA and  has been authorized for detection and/or diagnosis of SARS-CoV-2 by FDA under an Emergency Use Authorization (EUA). This EUA will remain  in effect (meaning this test can be used) for the duration of the COVID-19 declaration under Section 564(b)(1) of the Act, 21 U.S.C.section 360bbb-3(b)(1), unless the authorization is terminated  or revoked sooner.       Influenza A by PCR NEGATIVE NEGATIVE Final   Influenza B by PCR NEGATIVE NEGATIVE Final    Comment: (NOTE) The Xpert Xpress SARS-CoV-2/FLU/RSV plus assay is intended as an aid in the diagnosis of  influenza from Nasopharyngeal swab specimens and should not be used as a sole basis for treatment. Nasal washings and aspirates are unacceptable for Xpert Xpress SARS-CoV-2/FLU/RSV testing.  Fact Sheet for Patients: BloggerCourse.com  Fact Sheet for Healthcare Providers: SeriousBroker.it  This test is not yet approved or cleared by the [place] FDA and has been authorized for detection and/or diagnosis of SARS-CoV-2 by FDA under an Emergency Use Authorization (EUA). This EUA will remain in effect (meaning this test can be used) for the duration of the COVID-19 declaration under Section 564(b)(1) of the Act, 21 U.S.C. section 360bbb-3(b)(1), unless the authorization is terminated or revoked.     Resp Syncytial Virus by PCR NEGATIVE NEGATIVE Final    Comment: (NOTE) Fact Sheet for Patients: BloggerCourse.com  Fact Sheet for Healthcare Providers: SeriousBroker.it  This test is not yet approved or cleared by the [place] FDA and has been authorized for detection and/or diagnosis of SARS-CoV-2 by FDA under an Emergency Use Authorization (EUA). This EUA will remain in effect (meaning this test can be used) for the duration of the COVID-19 declaration under Section 564(b)(1) of the Act, 21 U.S.C. section 360bbb-3(b)(1), unless the authorization is terminated or revoked.  Performed at [facility], [address] [place], [place] [address]   Respiratory (~20 pathogens) panel by PCR     Status: None   Collection Time: 04/28/23  3:05 PM   Specimen: Nasopharyngeal Swab; Respiratory  Result Value Ref Range Status   Adenovirus NOT DETECTED NOT DETECTED Final   Coronavirus 229E NOT DETECTED NOT DETECTED Final    Comment: (NOTE) The Coronavirus on the Respiratory Panel, DOES NOT test for the novel  Coronavirus (2019 nCoV)    Coronavirus HKU1 NOT DETECTED  NOT DETECTED Final   Coronavirus NL63 NOT DETECTED NOT DETECTED Final   Coronavirus OC43 NOT DETECTED NOT DETECTED Final   Metapneumovirus NOT DETECTED NOT DETECTED Final   Rhinovirus / Enterovirus NOT DETECTED NOT DETECTED Final   Influenza A NOT DETECTED NOT DETECTED Final   Influenza B NOT DETECTED NOT DETECTED Final  Parainfluenza Virus 1 NOT DETECTED NOT DETECTED Final   Parainfluenza Virus 2 NOT DETECTED NOT DETECTED Final   Parainfluenza Virus 3 NOT DETECTED NOT DETECTED Final   Parainfluenza Virus 4 NOT DETECTED NOT DETECTED Final   Respiratory Syncytial Virus NOT DETECTED NOT DETECTED Final   Bordetella pertussis NOT DETECTED NOT DETECTED Final   Bordetella Parapertussis NOT DETECTED NOT DETECTED Final   Chlamydophila pneumoniae NOT DETECTED NOT DETECTED Final   Mycoplasma pneumoniae NOT DETECTED NOT DETECTED Final    Comment: Performed at [facility], [address], [place], [place] [address]  MRSA Next Gen by PCR, Nasal     Status: None   Collection Time: 04/28/23  8:06 PM   Specimen: Nasal Mucosa; Nasal Swab  Result Value Ref Range Status   MRSA by PCR Next Gen NOT DETECTED NOT DETECTED Final    Comment: (NOTE) The GeneXpert MRSA Assay (FDA approved for NASAL specimens only), is one component of a comprehensive MRSA colonization surveillance program. It is not intended to diagnose MRSA infection nor to guide or monitor treatment for MRSA infections. Test performance is not FDA approved in patients less than 60 years old. Performed at [facility], [address], [place], [place] [address]     Radiology Studies: No results found.  Scheduled Meds:  arformoterol  15 mcg Nebulization BID   aspirin EC  81 mg Oral Daily   budesonide (PULMICORT) nebulizer solution  0.25 mg Nebulization BID   docusate sodium  100 mg Oral BID   enoxaparin (LOVENOX) injection  40 mg Subcutaneous Q24H   famotidine  20 mg Oral BID   feeding supplement  237 mL  Oral BID BM   guaiFENesin  1,200 mg Oral BID   ipratropium-albuterol  3 mL Nebulization BID   [START ON 05/06/2023] predniSONE  50 mg Oral Q breakfast   rosuvastatin  40 mg Oral Daily   Continuous Infusions:   LOS: 7 days   Marguerita Merles, DO [facility] Available via Epic secure chat 7am-7pm After these hours, please refer to coverage provider listed on [email or web address] 05/05/2023, 2:18 PM

## 2023-05-06 DIAGNOSIS — J9611 Chronic respiratory failure with hypoxia: Secondary | ICD-10-CM | POA: Diagnosis not present

## 2023-05-06 DIAGNOSIS — K219 Gastro-esophageal reflux disease without esophagitis: Secondary | ICD-10-CM | POA: Diagnosis not present

## 2023-05-06 DIAGNOSIS — I5032 Chronic diastolic (congestive) heart failure: Secondary | ICD-10-CM | POA: Diagnosis not present

## 2023-05-06 DIAGNOSIS — J441 Chronic obstructive pulmonary disease with (acute) exacerbation: Secondary | ICD-10-CM | POA: Diagnosis not present

## 2023-05-06 MED ORDER — PREDNISONE 20 MG PO TABS
40.0000 mg | ORAL_TABLET | Freq: Every day | ORAL | Status: DC
  Administered 2023-05-07: 40 mg via ORAL
  Filled 2023-05-06: qty 2

## 2023-05-06 NOTE — TOC Progression Note (Addendum)
Transition of Care [facility]) - Progression Note    Patient Details  Name: Anthony Nelson MRN: [id number] Date of Birth: May 24, 1961  [facility] CM/SW Contact  Larrie Kass, LCSW Phone Number: 05/06/2023, 10:31 AM  Clinical Narrative:    SNF beds presented , pt has chosen [facility]. CSW sent message to [facility] admission coordinator with [facility]. CSW to star insurance auth . TOC to follow.   ADDEN  11:20am Auth pending. TOC to follow.  Expected Discharge Plan: Skilled Nursing Facility Barriers to Discharge: Continued Medical Work up  Expected Discharge Plan and Services       Living arrangements for the past 2 months: Boarding House                                       Social Determinants of Health (SDOH) Interventions SDOH Screenings   Food Insecurity: No Food Insecurity (04/28/2023)  Recent Concern: Food Insecurity - Food Insecurity Present (04/10/2023)  Housing: Low Risk  (04/28/2023)  Recent Concern: Housing - Medium Risk (04/10/2023)  Transportation Needs: No Transportation Needs (04/29/2023)  Recent Concern: Transportation Needs - Unmet Transportation Needs (04/28/2023)  Utilities: Not At Risk (04/28/2023)  Alcohol Screen: Low Risk  (12/26/2022)  Depression (PHQ2-9): Low Risk  (01/16/2023)  Physical Activity: Insufficiently Active (12/26/2022)  Social Connections: Moderately Isolated (12/26/2022)  Stress: No Stress Concern Present (12/26/2022)  Tobacco Use: Medium Risk (04/28/2023)    Readmission Risk Interventions     No data to display

## 2023-05-06 NOTE — Plan of Care (Signed)
  Problem: Education: Goal: Knowledge of General Education information will improve Description: Including pain rating scale, medication(s)/side effects and non-pharmacologic comfort measures Outcome: Progressing   Problem: Clinical Measurements: Goal: Respiratory complications will improve Outcome: Progressing   Problem: Nutrition: Goal: Adequate nutrition will be maintained Outcome: Adequate for Discharge   Problem: Coping: Goal: Level of anxiety will decrease Outcome: Adequate for Discharge

## 2023-05-06 NOTE — Progress Notes (Signed)
PROGRESS NOTE    Anthony Nelson  WJX:[id number] DOB: 11/27/1960 DOA: 04/28/2023 PCP: Storm Frisk, MD   Brief Narrative:  Anthony Nelson is a 62 yo male with PMH COPD, chronic hypoxia on 2L 02, pulmonary hypertension, GERD, diastolic CHF and other comorbidities who presented with shortness of breath. Symptoms have been ongoing for several days and he began having nonproductive cough and sensation of difficulty clearing sputum but he feels more short winded. Slightly hypoxic on admission, 89% and placed on 3 L.  VBG notable for hypercarbia and pH 7.28.  He was placed on BiPAP and admitted for further workup and monitoring.  He is slowly improving however he continues to desaturate but continues to improve daily.  Will watch him another night and continue IV steroids and taper down to 40 twice daily.  Repeat chest x-ray this a.m. showed no active disease and hematuria on O2 screen done showed that he needs at least 4 L while ambulating.  Patient was to be discharged back to the service center house today however was informed that he is not able to return.  Presently does not have a safe discharge disposition and the TOC team is trying to find a safe discharge disposition for him and attempting to get to SNF  Assessment and Plan:  COPD with Acute Exacerbation (HCC), improving -Mainly dyspnea and tight breath sounds; nonproductive cough -Desaturates and when working with PT the patient dropped to 86% just getting to the edge of the bed. Dropped to 88% on 4lnc with ambulation. -Added guaifenesin 1200 ohms p.o. twice daily and continue spirometry and flutter valve -Continue with breathing treatments with DuoNeb 3 mL 3 times daily and add Brovana 15 mcg neb twice daily and budesonide 0.25 mg p.o. twice daily; also has albuterol 2.5 mg Neb every 2 as needed for wheezing -IV Steroids now stopped and changed to po Prednisone Taper now and on 40 mg po Daily -Continue BiPAP as needed and at night; uses BiPAP  at Home SpO2: 94 % O2 Flow Rate (L/min): 3 L/min FiO2 (%): 28 % -Continuous pulse oximetry and maintain O2 saturations greater than 90% -Continue supplemental oxygen via nasal cannula and wean to home regimen -Repeat CXR showed no active disease with ambulatory home O2 screen done and he did desaturate on 2 L to 84% but came back up to 90% on 4 Liters  -Will need to write for Trelegy inhaler 1 puff daily at discharge   Homeless -Used to Resides at a shelter.  They were requesting PT eval prior to returning and initially they recommended no follow up but are now recommending SNF. -TOC to be involved; Plan was for the patient to Return to the [facility] however he was informed by the caseworker that the [facility] has reported concerns with the patient's ability to care for himself and have now told the caseworker that the patient not able to return to the [facility].  Currently patient does not have a safe discharge disposition but PT/OT now recommending SNF but he is ambulating over 350 Feet so may not be a SNF Candidate   GERD/GI Prophylaxis -Continue Famotidine 20 mg po BID   Hyperlipidemia -Continue Rosuvastatin 40 mg po Daily    Chronic Respiratory Failure with Hypoxia and Hypercapnia (HCC) -Chronic 2L at home SpO2: 94 % O2 Flow Rate (L/min): 3 L/min FiO2 (%): 28 % -Placed on 3L initially then was placed on BiPAP and now stable off BiPAP but we're using it at  night.  He states that he has a BiPAP at home and will continue this at discharge -Patient desaturated on ambulatory home O2 screen and had to be placed on 4 L while ambulating he may need to increase his oxygen while ambulating at discharge   Chronic Diastolic Heart Failure (HCC) -No evidence of acute decompensation of heart failure -Last TTE 04/18/2022 with LVEF 50-55%, LV with no regional wall trauma maladies, RV mildly enlarged, trivial MR.   .Strict I's and O's and Daily Weights;  Intake/Output Summary (Last  24 hours) at 05/06/2023 1630 Last data filed at 05/06/2023 0800 Gross per 24 hour  Intake 240 ml  Output 1400 ml  Net -1160 ml  -Only on lasix PRN at home; will resume if needed -Continue to monitor for signs or symptoms of volume overload  Macrocytic Anemia -Hgb/Hct Trend: Recent Labs  Lab 04/10/23 0607 04/28/23 1505 04/28/23 1506 04/29/23 0633 04/30/23 0700 05/02/23 0407 05/05/23 0417  HGB 11.5* 12.0* 13.6 10.9* 10.5* 11.5* 11.5*  HCT 38.3* 40.7 40.0 36.2* 33.9* 36.8* 38.2*  MCV 103.2* 106.3*  --  104.6* 102.7* 101.7* 105.2*  -Checked Anemia Panel showed an iron level 54, UIBC 258, TIBC 312, saturation ratios of 17%, ferritin level 142, folate of 6.4, vitamin B12 level 488 -Continue to Monitor for S/Sx of Bleeding; No overt bleeding noted -Repeat CBC in the AM   Thrombocytopenia -Platelet Count Trend: Recent Labs  Lab 04/09/23 2246 04/10/23 0607 04/28/23 1505 04/29/23 0633 04/30/23 0700 05/02/23 0407 05/05/23 0417  PLT 139* 144* 159 141* 148* 159 203  -Continue to Monitor for S/Sx of Bleeding; No overt bleeding noted  -Repeat CBC in the AM   Severe Protein Calorie Malnutrition Nutrition Status: Nutrition Problem: Severe Malnutrition Etiology: chronic illness (COPD) Signs/Symptoms: severe fat depletion, severe muscle depletion Interventions: Refer to RD note for recommendations, Ensure Enlive (each supplement provides 350kcal and 20 grams of protein), MVI, Liberalize Diet  Hypoalbuminemia -Patient's Albumin Trend: Recent Labs  Lab 04/10/23 0607 04/28/23 1505 05/02/23 0407 05/05/23 0417  ALBUMIN 3.2* 3.6 3.4* 3.3*  -Continue to Monitor and Trend and repeat CMP in the AM   DVT prophylaxis: enoxaparin (LOVENOX) injection 40 mg Start: 04/28/23 2200 SCDs Start: 04/28/23 1706    Code Status: Full Code Family Communication: No family currently at bedside  Disposition Plan:  Level of care: Progressive Status is: Inpatient Remains inpatient appropriate  because: Appears medically stable to be discharged but has been no safe discharge disposition at this time and Child psychotherapist strongly to the patient to a SNF and authorization is pending  Consultants:  None  Procedures:  As delineated as above  Antimicrobials:  Anti-infectives (From admission, onward)    Start     Dose/Rate Route Frequency Ordered Stop   04/28/23 1545  cefTRIAXone (ROCEPHIN) 1 g in sodium chloride 0.9 % 100 mL IVPB        1 g 200 mL/hr over 30 Minutes Intravenous  Once 04/28/23 1532 04/28/23 1606   04/28/23 1545  doxycycline (VIBRA-TABS) tablet 100 mg        100 mg Oral  Once 04/28/23 1532 04/28/23 1535       Subjective: Seen and examined at bedside and patient was doing fairly well had just ambulated.  Thinks his breathing is doing fairly well.  No nausea or vomiting.  No other concerns or complaints at this time but continues to have unsafe discharge disposition.  Objective: Vitals:   05/06/23 0452 05/06/23 0806 05/06/23 1033 05/06/23 1308  BP: 101/66   110/70  Pulse: 88   98  Resp: 20 (!) 21  20  Temp: 97.6 F (36.4 C)   98.3 F (36.8 C)  TempSrc: Oral   Oral  SpO2: 100% 98% 90% 94%  Weight:      Height:        Intake/Output Summary (Last 24 hours) at 05/06/2023 1642 Last data filed at 05/06/2023 0800 Gross per 24 hour  Intake 240 ml  Output 1400 ml  Net -1160 ml   Filed Weights   04/28/23 1440  Weight: 59 kg   Examination: Physical Exam:  Constitutional: Thin AAM in NAD Respiratory: Diminished to auscultation bilaterally with some coarse breath sounds and minimal wheezing, but no appreciable rales, rhonchi or crackles. Normal respiratory effort and patient is not tachypenic. No accessory muscle use.  Wearing supplemental oxygen via nasal cannula Cardiovascular: RRR, no murmurs / rubs / gallops. S1 and S2 auscultated. No extremity edema. Abdomen: Soft, non-tender, non-distended. Bowel sounds positive.  GU: Deferred. Musculoskeletal: No  clubbing / cyanosis of digits/nails. No joint deformity upper and lower extremities.  Skin: No rashes, lesions, ulcers on a limited skin evaluation. No induration; Warm and dry.  Neurologic: CN 2-12 grossly intact with no focal deficits. Romberg sign and cerebellar reflexes not assessed.  Psychiatric: Normal judgment and insight. Alert and oriented x 3. Normal mood and appropriate affect.   Data Reviewed: I have personally reviewed following labs and imaging studies  CBC: Recent Labs  Lab 04/30/23 0700 05/02/23 0407 05/05/23 0417  WBC 6.8 8.1 8.3  NEUTROABS  --  7.7 5.8  HGB 10.5* 11.5* 11.5*  HCT 33.9* 36.8* 38.2*  MCV 102.7* 101.7* 105.2*  PLT 148* 159 203   Basic Metabolic Panel: Recent Labs  Lab 04/30/23 0700 05/02/23 0407 05/05/23 0417  NA 142 135 141  K 3.8 4.2 4.1  CL 93* 89* 91*  CO2 40* 37* 42*  GLUCOSE 88 132* 92  BUN 18 17 22   CREATININE 0.88 0.50* 0.54*  CALCIUM 8.7* 8.7* 8.9  MG  --  2.1 2.1  PHOS  --  4.2 4.4   GFR: Estimated Creatinine Clearance: 78.9 mL/min (A) (by C-G formula based on SCr of 0.54 mg/dL (L)). Liver Function Tests: Recent Labs  Lab 05/02/23 0407 05/05/23 0417  AST 14* 18  ALT 18 28  ALKPHOS 45 43  BILITOT 0.5 0.4  PROT 6.7 6.5  ALBUMIN 3.4* 3.3*   No results for input(s): "LIPASE", "AMYLASE" in the last 168 hours. No results for input(s): "AMMONIA" in the last 168 hours. Coagulation Profile: No results for input(s): "INR", "PROTIME" in the last 168 hours. Cardiac Enzymes: No results for input(s): "CKTOTAL", "CKMB", "CKMBINDEX", "TROPONINI" in the last 168 hours. BNP (last 3 results) No results for input(s): "PROBNP" in the last 8760 hours. HbA1C: No results for input(s): "HGBA1C" in the last 72 hours. CBG: No results for input(s): "GLUCAP" in the last 168 hours. Lipid Profile: No results for input(s): "CHOL", "HDL", "LDLCALC", "TRIG", "CHOLHDL", "LDLDIRECT" in the last 72 hours. Thyroid Function Tests: No results for  input(s): "TSH", "T4TOTAL", "FREET4", "T3FREE", "THYROIDAB" in the last 72 hours. Anemia Panel: No results for input(s): "VITAMINB12", "FOLATE", "FERRITIN", "TIBC", "IRON", "RETICCTPCT" in the last 72 hours. Sepsis Labs: No results for input(s): "PROCALCITON", "LATICACIDVEN" in the last 168 hours.  Recent Results (from the past 240 hour(s))  Resp panel by RT-PCR (RSV, Flu A&B, Covid) Anterior Nasal Swab     Status: None   Collection Time:  04/28/23  3:05 PM   Specimen: Anterior Nasal Swab  Result Value Ref Range Status   SARS Coronavirus 2 by RT PCR NEGATIVE NEGATIVE Final    Comment: (NOTE) SARS-CoV-2 target nucleic acids are NOT DETECTED.  The SARS-CoV-2 RNA is generally detectable in upper respiratory specimens during the acute phase of infection. The lowest concentration of SARS-CoV-2 viral copies this assay can detect is 138 copies/mL. A negative result does not preclude SARS-Cov-2 infection and should not be used as the sole basis for treatment or other patient management decisions. A negative result may occur with  improper specimen collection/handling, submission of specimen other than nasopharyngeal swab, presence of viral mutation(s) within the areas targeted by this assay, and inadequate number of viral copies(<138 copies/mL). A negative result must be combined with clinical observations, patient history, and epidemiological information. The expected result is Negative.  Fact Sheet for Patients:  BloggerCourse.com  Fact Sheet for Healthcare Providers:  SeriousBroker.it  This test is no t yet approved or cleared by the [place] FDA and  has been authorized for detection and/or diagnosis of SARS-CoV-2 by FDA under an Emergency Use Authorization (EUA). This EUA will remain  in effect (meaning this test can be used) for the duration of the COVID-19 declaration under Section 564(b)(1) of the Act, 21 U.S.C.section  360bbb-3(b)(1), unless the authorization is terminated  or revoked sooner.       Influenza A by PCR NEGATIVE NEGATIVE Final   Influenza B by PCR NEGATIVE NEGATIVE Final    Comment: (NOTE) The Xpert Xpress SARS-CoV-2/FLU/RSV plus assay is intended as an aid in the diagnosis of influenza from Nasopharyngeal swab specimens and should not be used as a sole basis for treatment. Nasal washings and aspirates are unacceptable for Xpert Xpress SARS-CoV-2/FLU/RSV testing.  Fact Sheet for Patients: BloggerCourse.com  Fact Sheet for Healthcare Providers: SeriousBroker.it  This test is not yet approved or cleared by the [place] FDA and has been authorized for detection and/or diagnosis of SARS-CoV-2 by FDA under an Emergency Use Authorization (EUA). This EUA will remain in effect (meaning this test can be used) for the duration of the COVID-19 declaration under Section 564(b)(1) of the Act, 21 U.S.C. section 360bbb-3(b)(1), unless the authorization is terminated or revoked.     Resp Syncytial Virus by PCR NEGATIVE NEGATIVE Final    Comment: (NOTE) Fact Sheet for Patients: BloggerCourse.com  Fact Sheet for Healthcare Providers: SeriousBroker.it  This test is not yet approved or cleared by the [place] FDA and has been authorized for detection and/or diagnosis of SARS-CoV-2 by FDA under an Emergency Use Authorization (EUA). This EUA will remain in effect (meaning this test can be used) for the duration of the COVID-19 declaration under Section 564(b)(1) of the Act, 21 U.S.C. section 360bbb-3(b)(1), unless the authorization is terminated or revoked.  Performed at [facility], [address], [place], [place] [address]   Respiratory (~20 pathogens) panel by PCR     Status: None   Collection Time: 04/28/23  3:05 PM   Specimen: Nasopharyngeal Swab;  Respiratory  Result Value Ref Range Status   Adenovirus NOT DETECTED NOT DETECTED Final   Coronavirus 229E NOT DETECTED NOT DETECTED Final    Comment: (NOTE) The Coronavirus on the Respiratory Panel, DOES NOT test for the novel  Coronavirus (2019 nCoV)    Coronavirus HKU1 NOT DETECTED NOT DETECTED Final   Coronavirus NL63 NOT DETECTED NOT DETECTED Final   Coronavirus OC43 NOT DETECTED NOT DETECTED Final  Metapneumovirus NOT DETECTED NOT DETECTED Final   Rhinovirus / Enterovirus NOT DETECTED NOT DETECTED Final   Influenza A NOT DETECTED NOT DETECTED Final   Influenza B NOT DETECTED NOT DETECTED Final   Parainfluenza Virus 1 NOT DETECTED NOT DETECTED Final   Parainfluenza Virus 2 NOT DETECTED NOT DETECTED Final   Parainfluenza Virus 3 NOT DETECTED NOT DETECTED Final   Parainfluenza Virus 4 NOT DETECTED NOT DETECTED Final   Respiratory Syncytial Virus NOT DETECTED NOT DETECTED Final   Bordetella pertussis NOT DETECTED NOT DETECTED Final   Bordetella Parapertussis NOT DETECTED NOT DETECTED Final   Chlamydophila pneumoniae NOT DETECTED NOT DETECTED Final   Mycoplasma pneumoniae NOT DETECTED NOT DETECTED Final    Comment: Performed at [facility], [address], [place], [place] [address]  MRSA Next Gen by PCR, Nasal     Status: None   Collection Time: 04/28/23  8:06 PM   Specimen: Nasal Mucosa; Nasal Swab  Result Value Ref Range Status   MRSA by PCR Next Gen NOT DETECTED NOT DETECTED Final    Comment: (NOTE) The GeneXpert MRSA Assay (FDA approved for NASAL specimens only), is one component of a comprehensive MRSA colonization surveillance program. It is not intended to diagnose MRSA infection nor to guide or monitor treatment for MRSA infections. Test performance is not FDA approved in patients less than 74 years old. Performed at [facility], [address], [place], [place] [address]     Radiology Studies: No results found.  Scheduled Meds:   arformoterol  15 mcg Nebulization BID   aspirin EC  81 mg Oral Daily   budesonide (PULMICORT) nebulizer solution  0.25 mg Nebulization BID   docusate sodium  100 mg Oral BID   enoxaparin (LOVENOX) injection  40 mg Subcutaneous Q24H   famotidine  20 mg Oral BID   feeding supplement  237 mL Oral BID BM   guaiFENesin  1,200 mg Oral BID   ipratropium-albuterol  3 mL Nebulization BID   [START ON 05/07/2023] predniSONE  40 mg Oral Q breakfast   rosuvastatin  40 mg Oral Daily   Continuous Infusions:   LOS: 8 days   Marguerita Merles, DO [facility] Available via Epic secure chat 7am-7pm After these hours, please refer to coverage provider listed on [email or web address] 05/06/2023, 4:42 PM

## 2023-05-06 NOTE — Progress Notes (Signed)
Mobility Specialist - Progress Note  (2L [place]) Pre-mobility: 85% SPO2  (4L [place]) Pre-mobility: 109 bpm HR, 96% SpO2 During mobility: 120 bpm HR, 80% SpO2 Post-mobility: 102 bpm HR, 92% SPO2   05/06/23 1028  Oxygen Therapy  O2 Device Nasal Cannula  O2 Flow Rate (L/min) 4 L/min  Patient Activity (if Appropriate) Ambulating  Mobility  Activity Ambulated with assistance in hallway  Level of Assistance Standby assist, set-up cues, supervision of patient - no hands on  Assistive Device Front wheel walker  Distance Ambulated (ft) 350 ft  Range of Motion/Exercises Active  Activity Response Tolerated fair  Mobility Referral Yes  $Mobility charge 1 Mobility  Mobility Specialist Start Time (ACUTE ONLY) 1015  Mobility Specialist Stop Time (ACUTE ONLY) 1028  Mobility Specialist Time Calculation (min) (ACUTE ONLY) 13 min   Pt was found in bed and agreeable to ambulate. Upon entering room pt was on 2L [place] and SPO2 reading was 85%. Notified RN and increased to 4L [place]. Pt c/o bloating and stated needing to pass gas. Had x3 standing rest breaks during session due to SPO2 dropping to 80s. Able to increase >90% within 2 min. At EOS returned to recliner chair with all needs met. Call bell in reach and RN notified of session.   Billey Chang Mobility Specialist

## 2023-05-06 NOTE — Progress Notes (Signed)
Nutrition Follow-up  DOCUMENTATION CODES:   Severe malnutrition in context of chronic illness  INTERVENTION:  - Regular diet. - Ensure Plus High Protein po BID, each supplement provides 350 kcal and 20 grams of protein. - Continue Multivitamin with minerals daily - Monitor weight trends.   NUTRITION DIAGNOSIS:   Severe Malnutrition related to chronic illness (COPD) as evidenced by severe fat depletion, severe muscle depletion. *ongoing  GOAL:   Patient will meet greater than or equal to 90% of their needs *progressing  MONITOR:   PO intake, Supplement acceptance, Weight trends  REASON FOR ASSESSMENT:   Malnutrition Screening Tool    ASSESSMENT:   62 yo male with PMH COPD, chronic hypoxia on 2L 02, pulmonary hypertension, GERD, diastolic CHF who presented with shortness of breath.Admitted for acute COPD exacerbation.  Patient reports good appetite and eating well the past few days. Notes he occasionally accidentally skips a meal because he has a lot on his mind. Agreeable to have assist with ordering meals to ensure he orders 3 meals a day.  He is documented to be consuming 80-100% of meals. Also drinking Ensure 1-2 times per day.  Encouraged patient to continue to eat well and ensure he is eating 3 meals a day. Also encouraged continued intake of Ensure.   Medications reviewed and include: Colace  Labs reviewed:  -   Diet Order:   Diet Order             Diet regular Room service appropriate? Yes; Fluid consistency: Thin  Diet effective now                   EDUCATION NEEDS:  Education needs have been addressed  Skin:  Skin Assessment: Reviewed RN Assessment  Last BM:  11/4  Height:  Ht Readings from Last 1 Encounters:  04/28/23 6' (1.829 m)   Weight:  Wt Readings from Last 1 Encounters:  04/28/23 59 kg   BMI:  Body mass index is 17.64 kg/m.  Estimated Nutritional Needs:  Kcal:  2050-2250 kcals Protein:  90-105 grams Fluid:  >/=  2L    Shelle Iron RD, LDN For contact information, refer to [facility].

## 2023-05-07 DIAGNOSIS — J9611 Chronic respiratory failure with hypoxia: Secondary | ICD-10-CM | POA: Diagnosis not present

## 2023-05-07 DIAGNOSIS — K219 Gastro-esophageal reflux disease without esophagitis: Secondary | ICD-10-CM | POA: Diagnosis not present

## 2023-05-07 DIAGNOSIS — I5032 Chronic diastolic (congestive) heart failure: Secondary | ICD-10-CM | POA: Diagnosis not present

## 2023-05-07 DIAGNOSIS — J441 Chronic obstructive pulmonary disease with (acute) exacerbation: Secondary | ICD-10-CM | POA: Diagnosis not present

## 2023-05-07 MED ORDER — PREDNISONE 20 MG PO TABS
20.0000 mg | ORAL_TABLET | Freq: Every day | ORAL | Status: AC
  Administered 2023-05-09: 20 mg via ORAL
  Filled 2023-05-07: qty 1

## 2023-05-07 MED ORDER — PREDNISONE 20 MG PO TABS: 30.0000 mg | ORAL_TABLET | Freq: Every day | ORAL | Status: DC

## 2023-05-07 MED ORDER — PREDNISONE 10 MG PO TABS
10.0000 mg | ORAL_TABLET | Freq: Every day | ORAL | Status: AC
  Administered 2023-05-10: 10 mg via ORAL
  Filled 2023-05-07: qty 1

## 2023-05-07 MED ORDER — PREDNISONE 20 MG PO TABS
30.0000 mg | ORAL_TABLET | Freq: Every day | ORAL | Status: AC
  Administered 2023-05-08: 30 mg via ORAL
  Filled 2023-05-07: qty 1

## 2023-05-07 NOTE — Progress Notes (Addendum)
Physical Therapy Treatment Patient Details Name: Anthony Nelson MRN: [id number] DOB: 08-09-1960 Today's Date: 05/07/2023   History of Present Illness 62 yo male admitted with COPD exac. Hx of COPD-2L baseline, HF, pulm HTN    PT Comments  Pt agreeable to working with therapy. He states he would like to be able to take a shower-encouraged him to discuss this with MD/nursing. O2: 90% on 3L at rest, 78% on 4L with ambulation on today (had to bump up to 6L to aid O2 sat recovery). Will continue to follow and progress activity as tolerated.   If plan is discharge home, recommend the following: Assist for transportation;Help with stairs or ramp for entrance   Can travel by private vehicle        Equipment Recommendations  None recommended by PT    Recommendations for Other Services       Precautions / Restrictions Precautions Precaution Comments: monitor O2 Restrictions Weight Bearing Restrictions: No     Mobility  Bed Mobility Overal bed mobility: Modified Independent                  Transfers Overall transfer level: Modified independent Equipment used: Rollator (4 wheels)                    Ambulation/Gait Ambulation/Gait assistance: Supervision Gait Distance (Feet): 175 Feet (x2) Assistive device: Rollator (4 wheels) Gait Pattern/deviations: Step-through pattern, Decreased stride length       General Gait Details: Supv for monitoring of O2 and cues for proper operation of rollator. Standing rest break after ~175 to allow for O2 sat recovery (sats down to 78% on 4L on today-bumped up to 6L until sats recovered to 90% then walked back to room). Cues for pursed lip breathing.   Stairs             Wheelchair Mobility     Tilt Bed    Modified Rankin (Stroke Patients Only)       Balance                                            Cognition Arousal: Alert Behavior During Therapy: WFL for tasks assessed/performed Overall  Cognitive Status: Within Functional Limits for tasks assessed                                          Exercises      General Comments        Pertinent Vitals/Pain Pain Assessment Pain Assessment: No/denies pain    Home Living                          Prior Function            PT Goals (current goals can now be found in the care plan section) Progress towards PT goals: Progressing toward goals    Frequency    Min 1X/week      PT Plan      Co-evaluation              AM-PAC PT "6 Clicks" Mobility   Outcome Measure  Help needed turning from your back to your side while in a flat bed without using bedrails?: None Help needed moving from  lying on your back to sitting on the side of a flat bed without using bedrails?: None Help needed moving to and from a bed to a chair (including a wheelchair)?: None Help needed standing up from a chair using your arms (e.g., wheelchair or bedside chair)?: None Help needed to walk in hospital room?: A Little Help needed climbing 3-5 steps with a railing? : A Little 6 Click Score: 22    End of Session Equipment Utilized During Treatment: Oxygen Activity Tolerance:  (limited by dyspnea and drop in O2 sats) Patient left: in bed;with call bell/phone within reach   PT Visit Diagnosis: Difficulty in walking, not elsewhere classified (R26.2)     Time: 0102-7253 PT Time Calculation (min) (ACUTE ONLY): 26 min  Charges:    $Gait Training: 23-37 mins PT General Charges $$ ACUTE PT VISIT: 1 Visit                         Faye Ramsay, PT Acute Rehabilitation  Office: [phone]

## 2023-05-07 NOTE — Plan of Care (Signed)
  Problem: Clinical Measurements: ?Goal: Will remain free from infection ?Outcome: Progressing ?Goal: Diagnostic test results will improve ?Outcome: Progressing ?Goal: Respiratory complications will improve ?Outcome: Progressing ?  ?Problem: Coping: ?Goal: Level of anxiety will decrease ?Outcome: Progressing ?  ?

## 2023-05-07 NOTE — Progress Notes (Signed)
Pt stated he wanted to go on bipap between 1-2AM.

## 2023-05-07 NOTE — TOC Progression Note (Addendum)
Transition of Care [facility]) - Progression Note    Patient Details  Name: Anthony Nelson MRN: [id number] Date of Birth: 30-Nov-1960  Transition of Care [facility]) CM/SW Contact  Larrie Kass, LCSW Phone Number: 05/07/2023, 9:12 AM  Clinical Narrative:    Insurance auth from SNF placement is pending.    Adden 11:30am CSW called home and community Care to check status of insurance auth. Berkley Harvey is currently being reviewed by medical director. TOC to follow.   12:18pm Pt's Berkley Harvey went to [facility] [phone] option 5. Must be completed by 4:30pm today, MD made aware. TOC to follow.   12:30pm  CSW spoke with the pt regarding the insurance denial for SNF placement. CSW called Misty Stanley SW with [employer] on speaker. She reports their OT looked over the pt's medical records and will not approve a housing voucher at this time. She reports when pt is in the community the OT director can complete an assessment. Pt expressed his concerns to both [place] and CSW. Misty Stanley stated there is nothing she can do at this point.   CSW inquired if pt could afford a hotel room, pt stated "That is the reason I was at the servant center, the hotel was 300 a week" Pt reports he will try contacting his cousin, who may have a contact number for a boarding home. TOC to follow.   2:40pm  CSW met with pt to discuss disposition options. Pt were not able to get the boarding home contact number. CSW informed pt there may be a facility that can take him for LTC, however, he will have to turn his check over to the facility. Pt stated his concerns with turning his check over. He inquired if they would go through his bank statements. He asked how long he would have to stay at the facility. Pt stated, "This is a hard decision, when do I have to make a decision." CSW explained he is medically stable and will need to make a decision soon.  Pt was informed when this CSW hears back from the facility.  CSW explained that the only other option would  be getting a hotel room and submitting an application for aide services through the [place].   CSW spoke with Dylan with [employer], they are reviewing pt for LTC. CSW will follow up with pt in the morning.TOC to follow.   Expected Discharge Plan: Skilled Nursing Facility Barriers to Discharge: Continued Medical Work up  Expected Discharge Plan and Services       Living arrangements for the past 2 months: Boarding House                                       Social Determinants of Health (SDOH) Interventions SDOH Screenings   Food Insecurity: No Food Insecurity (04/28/2023)  Recent Concern: Food Insecurity - Food Insecurity Present (04/10/2023)  Housing: Low Risk  (04/28/2023)  Recent Concern: Housing - Medium Risk (04/10/2023)  Transportation Needs: No Transportation Needs (04/29/2023)  Recent Concern: Transportation Needs - Unmet Transportation Needs (04/28/2023)  Utilities: Not At Risk (04/28/2023)  Alcohol Screen: Low Risk  (12/26/2022)  Depression (PHQ2-9): Low Risk  (01/16/2023)  Physical Activity: Insufficiently Active (12/26/2022)  Social Connections: Moderately Isolated (12/26/2022)  Stress: No Stress Concern Present (12/26/2022)  Tobacco Use: Medium Risk (04/28/2023)    Readmission Risk Interventions     No data to display

## 2023-05-07 NOTE — Progress Notes (Signed)
   05/07/23 0129  BiPAP/CPAP/SIPAP  BiPAP/CPAP/SIPAP Pt Type Adult  BiPAP/CPAP/SIPAP V60  Mask Type Full face mask  Mask Size Medium  Set Rate 12 breaths/min  Respiratory Rate 21 breaths/min  IPAP 12 cmH20  EPAP 5 cmH2O  FiO2 (%) 35 %  Minute Ventilation 11  Leak 26  Peak Inspiratory Pressure (PIP) 13  Tidal Volume (Vt) 584  Patient Home Equipment No  Auto Titrate No  Press High Alarm 30 cmH2O  Press Low Alarm 5 cmH2O

## 2023-05-07 NOTE — Progress Notes (Addendum)
PROGRESS NOTE    TOMOTHY EDDINS  NWG:[id number] DOB: 1960/07/25 DOA: 04/28/2023 PCP: Storm Frisk, MD   Brief Narrative:  Mr. Rodriguez is a 62 yo male with PMH COPD, chronic hypoxia on 2L 02, pulmonary hypertension, GERD, diastolic CHF and other comorbidities who presented with shortness of breath. Symptoms have been ongoing for several days and he began having nonproductive cough and sensation of difficulty clearing sputum but he feels more short winded. Slightly hypoxic on admission, 89% and placed on 3 L.  VBG notable for hypercarbia and pH 7.28.  He was placed on BiPAP and admitted for further workup and monitoring.  He is slowly improving however he continues to desaturate but continues to improve daily.  Will watch him another night and continue IV steroids and taper down to 40 twice daily.  Repeat chest x-ray this a.m. showed no active disease and hematuria on O2 screen done showed that he needs at least 4 L while ambulating.    Patient was to be discharged back to the service center house on Friday, November 1, however was informed that he is not able to return.  Presently does not have a safe discharge disposition and the [facility] team is trying to find a safe discharge disposition for him.  Assessment and Plan:  COPD with Acute Exacerbation (HCC), improving -Mainly dyspnea and tight breath sounds; nonproductive cough -Desaturates and when working with PT the patient dropped to 86% just getting to the edge of the bed. Dropped to 88% on 4lnc with ambulation. -Added guaifenesin 1200 ohms p.o. twice daily and continue spirometry and flutter valve -Continue with breathing treatments with DuoNeb 3 mL 3 times daily and add Brovana 15 mcg neb twice daily and budesonide 0.25 mg p.o. twice daily; also has albuterol 2.5 mg Neb every 2 as needed for wheezing -IV Steroids now stopped and changed to po Prednisone Taper now and on 30 mg today, 20 mg tomorrow and then 10 mg for 5 manage [ -Continue BiPAP  as needed and at night; uses BiPAP at Home SpO2: 95 % O2 Flow Rate (L/min): 3 L/min FiO2 (%): 35 % -Continuous pulse oximetry and maintain O2 saturations greater than 90% -Continue supplemental oxygen via nasal cannula and wean to home regimen -Repeat CXR showed no active disease and ambulatory home O2 screen done and he did desaturate again and was 90% on 3 L at rest, 78% on 4 L with ambulation and had to be bumped up to 6 L to aid in the recovery -Will need to write for Trelegy inhaler 1 puff daily at discharge whenever he can be safely discharged; per discussion with the Pulmonology team they are recommending discontinuing his Bevespi Aerosphere and Incruse Ellipta in substitution for the Trelegy   Homeless -Used to Resides at a shelter.  They were requesting PT eval prior to returning and initially they recommended no follow up but are now recommending SNF. -TOC to be involved; Plan was for the patient to Return to the [facility] however he was informed by the caseworker that the [facility] has reported concerns with the patient's ability to care for himself and have now told the caseworker that the patient not able to return to the [facility].  Currently patient does not have a safe discharge disposition but PT/OT was recommending SNF; He is ambulating over 350 Feet so I feel he is not a SNF Candidate due to no skill-able needs and as expected Insurance requested a Peer to Peer however  I agree with the denial. -His housing voucher has not been approved at this time and the social worker discussed about affordability of the hotel room but he cannot afford it at this time.  Discharge disposition pending given the amount of the equipment he needs with a BiPAP at night and as needed, home oxygen and oxygen concentrator as well as nebulizer.   GERD/GI Prophylaxis -Continue Famotidine 20 mg po BID   Hyperlipidemia -Continue Rosuvastatin 40 mg po Daily    Chronic Respiratory Failure  with Hypoxia and Hypercapnia (HCC) -Chronic 2L at home SpO2: 95 % O2 Flow Rate (L/min): 3 L/min FiO2 (%): 35 % -Placed on 3L initially then was placed on BiPAP and now stable off BiPAP but we're using it at night.  He states that he has a BiPAP at home and will continue this at discharge -Patient desaturated on ambulatory home O2 screen and had to be placed on 4 L while ambulating he may need to increase his oxygen while ambulating at discharge   Chronic Diastolic Heart Failure (HCC) -No evidence of acute decompensation of heart failure -Last TTE 04/18/2022 with LVEF 50-55%, LV with no regional wall trauma maladies, RV mildly enlarged, trivial MR.   .Strict I's and O's and Daily Weights;  Intake/Output Summary (Last 24 hours) at 05/07/2023 1318 Last data filed at 05/07/2023 0945 Gross per 24 hour  Intake 480 ml  Output 1000 ml  Net -520 ml  -Only on lasix PRN at home; will resume if needed -Continue to monitor for signs or symptoms of volume overload  Macrocytic Anemia -Hgb/Hct Trend relatively stable with last Hgb/Hct being 11.5/38.2 -Checked Anemia Panel showed an iron level 54, UIBC 258, TIBC 312, saturation ratios of 17%, ferritin level 142, folate of 6.4, vitamin B12 level 488 -Continue to Monitor for S/Sx of Bleeding; No overt bleeding noted -Repeat CBC in the AM   Thrombocytopenia -Platelet Count Trend improved and resolved and went from 139 -> 203 -Continue to Monitor for S/Sx of Bleeding; No overt bleeding noted  -Repeat CBC in the AM   Severe Protein Calorie Malnutrition Nutrition Status: Nutrition Problem: Severe Malnutrition Etiology: chronic illness (COPD) Signs/Symptoms: severe fat depletion, severe muscle depletion Interventions: Refer to RD note for recommendations, Ensure Enlive (each supplement provides 350kcal and 20 grams of protein), MVI, Liberalize Diet  Hypoalbuminemia -Patient's Albumin Trending from 3.2-3.6 -Continue to Monitor and Trend and repeat CMP  in the AM  DVT prophylaxis: enoxaparin (LOVENOX) injection 40 mg Start: 04/28/23 2200 SCDs Start: 04/28/23 1706    Code Status: Full Code Family Communication: No family currently at bedside  Disposition Plan:  Level of care: Progressive Status is: Inpatient Remains inpatient appropriate because: Has a unsafe discharge disposition   Consultants:  None  Procedures:  As delineated as above  Antimicrobials:  Anti-infectives (From admission, onward)    Start     Dose/Rate Route Frequency Ordered Stop   04/28/23 1545  cefTRIAXone (ROCEPHIN) 1 g in sodium chloride 0.9 % 100 mL IVPB        1 g 200 mL/hr over 30 Minutes Intravenous  Once 04/28/23 1532 04/28/23 1606   04/28/23 1545  doxycycline (VIBRA-TABS) tablet 100 mg        100 mg Oral  Once 04/28/23 1532 04/28/23 1535       Subjective: Seen and examined at bedside and his respiratory status was fairly stable.  Denied any nausea or vomiting.  States he had a bowel movement yesterday. Unfortunately he does not  qualify for SNF and currently does not have a safe discharge disposition.  No other concerns or points at this time.  Objective: Vitals:   05/07/23 0408 05/07/23 0811 05/07/23 0832 05/07/23 1233  BP: 102/76 103/72  111/70  Pulse: 71 85  96  Resp: 20     Temp:  98 F (36.7 C)  98.4 F (36.9 C)  TempSrc:  Oral  Oral  SpO2: 98% 98% 97% 95%  Weight:      Height:        Intake/Output Summary (Last 24 hours) at 05/07/2023 1341 Last data filed at 05/07/2023 0945 Gross per 24 hour  Intake 480 ml  Output 1000 ml  Net -520 ml   Filed Weights   04/28/23 1440  Weight: 59 kg   Examination: Physical Exam:  Constitutional: Thin African-American male in no acute distress appears calm Respiratory: Diminished to auscultation bilaterally with some coarse breath sounds and has some slight wheezing but no appreciable rales, rhonchi or crackles.  Has a normal respiratory effort and is not tachypneic or using any accessory  muscles with good wearing supplemental oxygen nasal cannula Cardiovascular: RRR, no murmurs / rubs / gallops. S1 and S2 auscultated. No extremity edema.  Abdomen: Soft, non-tender, non-distended. Bowel sounds positive.  GU: Deferred. Musculoskeletal: No clubbing / cyanosis of digits/nails. No joint deformity upper and lower extremities.  Skin: No rashes, lesions, ulcers on limited skin evaluation. No induration; Warm and dry.  Neurologic: CN 2-12 grossly intact with no focal deficits. Romberg sign and cerebellar reflexes not assessed.  Psychiatric: Normal judgment and insight. Alert and oriented x 3. Normal mood and appropriate affect.   Data Reviewed: I have personally reviewed following labs and imaging studies  CBC: Recent Labs  Lab 05/02/23 0407 05/05/23 0417  WBC 8.1 8.3  NEUTROABS 7.7 5.8  HGB 11.5* 11.5*  HCT 36.8* 38.2*  MCV 101.7* 105.2*  PLT 159 203   Basic Metabolic Panel: Recent Labs  Lab 05/02/23 0407 05/05/23 0417  NA 135 141  K 4.2 4.1  CL 89* 91*  CO2 37* 42*  GLUCOSE 132* 92  BUN 17 22  CREATININE 0.50* 0.54*  CALCIUM 8.7* 8.9  MG 2.1 2.1  PHOS 4.2 4.4   GFR: Estimated Creatinine Clearance: 78.9 mL/min (A) (by C-G formula based on SCr of 0.54 mg/dL (L)). Liver Function Tests: Recent Labs  Lab 05/02/23 0407 05/05/23 0417  AST 14* 18  ALT 18 28  ALKPHOS 45 43  BILITOT 0.5 0.4  PROT 6.7 6.5  ALBUMIN 3.4* 3.3*   No results for input(s): "LIPASE", "AMYLASE" in the last 168 hours. No results for input(s): "AMMONIA" in the last 168 hours. Coagulation Profile: No results for input(s): "INR", "PROTIME" in the last 168 hours. Cardiac Enzymes: No results for input(s): "CKTOTAL", "CKMB", "CKMBINDEX", "TROPONINI" in the last 168 hours. BNP (last 3 results) No results for input(s): "PROBNP" in the last 8760 hours. HbA1C: No results for input(s): "HGBA1C" in the last 72 hours. CBG: No results for input(s): "GLUCAP" in the last 168 hours. Lipid  Profile: No results for input(s): "CHOL", "HDL", "LDLCALC", "TRIG", "CHOLHDL", "LDLDIRECT" in the last 72 hours. Thyroid Function Tests: No results for input(s): "TSH", "T4TOTAL", "FREET4", "T3FREE", "THYROIDAB" in the last 72 hours. Anemia Panel: No results for input(s): "VITAMINB12", "FOLATE", "FERRITIN", "TIBC", "IRON", "RETICCTPCT" in the last 72 hours. Sepsis Labs: No results for input(s): "PROCALCITON", "LATICACIDVEN" in the last 168 hours.  Recent Results (from the past 240 hour(s))  Resp panel by  RT-PCR (RSV, Flu A&B, Covid) Anterior Nasal Swab     Status: None   Collection Time: 04/28/23  3:05 PM   Specimen: Anterior Nasal Swab  Result Value Ref Range Status   SARS Coronavirus 2 by RT PCR NEGATIVE NEGATIVE Final    Comment: (NOTE) SARS-CoV-2 target nucleic acids are NOT DETECTED.  The SARS-CoV-2 RNA is generally detectable in upper respiratory specimens during the acute phase of infection. The lowest concentration of SARS-CoV-2 viral copies this assay can detect is 138 copies/mL. A negative result does not preclude SARS-Cov-2 infection and should not be used as the sole basis for treatment or other patient management decisions. A negative result may occur with  improper specimen collection/handling, submission of specimen other than nasopharyngeal swab, presence of viral mutation(s) within the areas targeted by this assay, and inadequate number of viral copies(<138 copies/mL). A negative result must be combined with clinical observations, patient history, and epidemiological information. The expected result is Negative.  Fact Sheet for Patients:  BloggerCourse.com  Fact Sheet for Healthcare Providers:  SeriousBroker.it  This test is no t yet approved or cleared by the [place] FDA and  has been authorized for detection and/or diagnosis of SARS-CoV-2 by FDA under an Emergency Use Authorization (EUA). This EUA will  remain  in effect (meaning this test can be used) for the duration of the COVID-19 declaration under Section 564(b)(1) of the Act, 21 U.S.C.section 360bbb-3(b)(1), unless the authorization is terminated  or revoked sooner.       Influenza A by PCR NEGATIVE NEGATIVE Final   Influenza B by PCR NEGATIVE NEGATIVE Final    Comment: (NOTE) The Xpert Xpress SARS-CoV-2/FLU/RSV plus assay is intended as an aid in the diagnosis of influenza from Nasopharyngeal swab specimens and should not be used as a sole basis for treatment. Nasal washings and aspirates are unacceptable for Xpert Xpress SARS-CoV-2/FLU/RSV testing.  Fact Sheet for Patients: BloggerCourse.com  Fact Sheet for Healthcare Providers: SeriousBroker.it  This test is not yet approved or cleared by the [place] FDA and has been authorized for detection and/or diagnosis of SARS-CoV-2 by FDA under an Emergency Use Authorization (EUA). This EUA will remain in effect (meaning this test can be used) for the duration of the COVID-19 declaration under Section 564(b)(1) of the Act, 21 U.S.C. section 360bbb-3(b)(1), unless the authorization is terminated or revoked.     Resp Syncytial Virus by PCR NEGATIVE NEGATIVE Final    Comment: (NOTE) Fact Sheet for Patients: BloggerCourse.com  Fact Sheet for Healthcare Providers: SeriousBroker.it  This test is not yet approved or cleared by the [place] FDA and has been authorized for detection and/or diagnosis of SARS-CoV-2 by FDA under an Emergency Use Authorization (EUA). This EUA will remain in effect (meaning this test can be used) for the duration of the COVID-19 declaration under Section 564(b)(1) of the Act, 21 U.S.C. section 360bbb-3(b)(1), unless the authorization is terminated or revoked.  Performed at [facility], [address] [place], [place]  [address]   Respiratory (~20 pathogens) panel by PCR     Status: None   Collection Time: 04/28/23  3:05 PM   Specimen: Nasopharyngeal Swab; Respiratory  Result Value Ref Range Status   Adenovirus NOT DETECTED NOT DETECTED Final   Coronavirus 229E NOT DETECTED NOT DETECTED Final    Comment: (NOTE) The Coronavirus on the Respiratory Panel, DOES NOT test for the novel  Coronavirus (2019 nCoV)    Coronavirus HKU1 NOT DETECTED NOT DETECTED Final  Coronavirus NL63 NOT DETECTED NOT DETECTED Final   Coronavirus OC43 NOT DETECTED NOT DETECTED Final   Metapneumovirus NOT DETECTED NOT DETECTED Final   Rhinovirus / Enterovirus NOT DETECTED NOT DETECTED Final   Influenza A NOT DETECTED NOT DETECTED Final   Influenza B NOT DETECTED NOT DETECTED Final   Parainfluenza Virus 1 NOT DETECTED NOT DETECTED Final   Parainfluenza Virus 2 NOT DETECTED NOT DETECTED Final   Parainfluenza Virus 3 NOT DETECTED NOT DETECTED Final   Parainfluenza Virus 4 NOT DETECTED NOT DETECTED Final   Respiratory Syncytial Virus NOT DETECTED NOT DETECTED Final   Bordetella pertussis NOT DETECTED NOT DETECTED Final   Bordetella Parapertussis NOT DETECTED NOT DETECTED Final   Chlamydophila pneumoniae NOT DETECTED NOT DETECTED Final   Mycoplasma pneumoniae NOT DETECTED NOT DETECTED Final    Comment: Performed at [facility], [address] [place], [place] [address]  MRSA Next Gen by PCR, Nasal     Status: None   Collection Time: 04/28/23  8:06 PM   Specimen: Nasal Mucosa; Nasal Swab  Result Value Ref Range Status   MRSA by PCR Next Gen NOT DETECTED NOT DETECTED Final    Comment: (NOTE) The GeneXpert MRSA Assay (FDA approved for NASAL specimens only), is one component of a comprehensive MRSA colonization surveillance program. It is not intended to diagnose MRSA infection nor to guide or monitor treatment for MRSA infections. Test performance is not FDA approved in patients less than 62 years old. Performed at [facility], [address] [place], [place] [address]     Radiology Studies: No results found.  Scheduled Meds:  arformoterol  15 mcg Nebulization BID   aspirin EC  81 mg Oral Daily   budesonide (PULMICORT) nebulizer solution  0.25 mg Nebulization BID   docusate sodium  100 mg Oral BID   enoxaparin (LOVENOX) injection  40 mg Subcutaneous Q24H   famotidine  20 mg Oral BID   feeding supplement  237 mL Oral BID BM   guaiFENesin  1,200 mg Oral BID   ipratropium-albuterol  3 mL Nebulization BID   [START ON 05/08/2023] predniSONE  30 mg Oral Daily   Followed by   Melene Muller ON 05/09/2023] predniSONE  20 mg Oral Daily   Followed by   Melene Muller ON 05/10/2023] predniSONE  10 mg Oral Daily   rosuvastatin  40 mg Oral Daily   Continuous Infusions:   LOS: 9 days   Marguerita Merles, DO [facility] Available via Epic secure chat 7am-7pm After these hours, please refer to coverage provider listed on [email or web address] 05/07/2023, 1:41 PM

## 2023-05-08 DIAGNOSIS — J441 Chronic obstructive pulmonary disease with (acute) exacerbation: Secondary | ICD-10-CM | POA: Diagnosis not present

## 2023-05-08 LAB — CBC WITH DIFFERENTIAL/PLATELET
Abs Immature Granulocytes: 0.04 10*3/uL (ref 0.00–0.07)
Basophils Absolute: 0 10*3/uL (ref 0.0–0.1)
Basophils Relative: 0 %
Eosinophils Absolute: 0.1 10*3/uL (ref 0.0–0.5)
Eosinophils Relative: 1 %
HCT: 35.8 % — ABNORMAL LOW (ref 39.0–52.0)
Hemoglobin: 11.1 g/dL — ABNORMAL LOW (ref 13.0–17.0)
Immature Granulocytes: 0 %
Lymphocytes Relative: 19 %
Lymphs Abs: 1.7 10*3/uL (ref 0.7–4.0)
MCH: 32 pg (ref 26.0–34.0)
MCHC: 31 g/dL (ref 30.0–36.0)
MCV: 103.2 fL — ABNORMAL HIGH (ref 80.0–100.0)
Monocytes Absolute: 0.8 10*3/uL (ref 0.1–1.0)
Monocytes Relative: 8 %
Neutro Abs: 6.6 10*3/uL (ref 1.7–7.7)
Neutrophils Relative %: 72 %
Platelets: 220 10*3/uL (ref 150–400)
RBC: 3.47 MIL/uL — ABNORMAL LOW (ref 4.22–5.81)
RDW: 12.3 % (ref 11.5–15.5)
WBC: 9.1 10*3/uL (ref 4.0–10.5)
nRBC: 0 % (ref 0.0–0.2)

## 2023-05-08 LAB — COMPREHENSIVE METABOLIC PANEL
ALT: 29 U/L (ref 0–44)
AST: 17 U/L (ref 15–41)
Albumin: 3.2 g/dL — ABNORMAL LOW (ref 3.5–5.0)
Alkaline Phosphatase: 43 U/L (ref 38–126)
Anion gap: 8 (ref 5–15)
BUN: 17 mg/dL (ref 8–23)
CO2: 38 mmol/L — ABNORMAL HIGH (ref 22–32)
Calcium: 8.3 mg/dL — ABNORMAL LOW (ref 8.9–10.3)
Chloride: 91 mmol/L — ABNORMAL LOW (ref 98–111)
Creatinine, Ser: 0.42 mg/dL — ABNORMAL LOW (ref 0.61–1.24)
GFR, Estimated: >60 mL/min (ref >60)
Glucose, Bld: 82 mg/dL (ref 70–99)
Potassium: 3.3 mmol/L — ABNORMAL LOW (ref 3.5–5.1)
Sodium: 137 mmol/L (ref 135–145)
Total Bilirubin: 0.5 mg/dL (ref <1.2)
Total Protein: 6.1 g/dL — ABNORMAL LOW (ref 6.5–8.1)

## 2023-05-08 LAB — MAGNESIUM: Magnesium: 2.1 mg/dL (ref 1.7–2.4)

## 2023-05-08 LAB — PHOSPHORUS: Phosphorus: 4.4 mg/dL (ref 2.5–4.6)

## 2023-05-08 MED ORDER — POTASSIUM CHLORIDE CRYS ER 20 MEQ PO TBCR
20.0000 meq | EXTENDED_RELEASE_TABLET | Freq: Once | ORAL | Status: AC
  Administered 2023-05-08: 20 meq via ORAL
  Filled 2023-05-08: qty 1

## 2023-05-08 MED ORDER — ORAL CARE MOUTH RINSE: 15.0000 mL | OROMUCOSAL | Status: DC | PRN

## 2023-05-08 NOTE — Progress Notes (Addendum)
Anthony NOTE    PAU Nelson  ZOX:[id number] DOB: 1960/09/12 DOA: 04/28/2023 PCP: Storm Frisk, MD   Brief Narrative:   [name] is a 62 yo male with PMH COPD, chronic hypoxia on 2L 02, pulmonary hypertension, GERD, diastolic CHF and other comorbidities who presented with shortness of breath. Symptoms have been ongoing for several days and he began having nonproductive cough and sensation of difficulty clearing sputum but he feels more short winded. Slightly hypoxic on admission, 89% and placed on 3 L.  VBG notable for hypercarbia and pH 7.28.  He was placed on BiPAP and admitted for further workup and monitoring.  He is slowly improving however he continues to desaturate but continues to improve daily. Needs at least 4 L of O2 while ambulating. Patient [facility], [facility], however was informed that he is not able to return.  Presently does not have a safe discharge disposition and the [facility] team is trying to find a safe discharge disposition for him. Pt has agreed to stay in a motel for now   Today, pt denies any new complaints     Assessment and Plan:   COPD with Acute Exacerbation (HCC), improving Chronic Respiratory Failure with Hypoxia and Hypercapnia (HCC) Desaturates and when working with PT the patient dropped to 86% just getting to the edge of the bed. Dropped to 88% on 4lnc with ambulation. Added guaifenesin 1200 ohms p.o. twice daily and continue spirometry and flutter valve Continue with breathing treatments with DuoNeb 3 mL 3 times daily and add Brovana 15 mcg neb twice daily and budesonide 0.25 mg p.o. twice daily; also has albuterol 2.5 mg Neb every 2 as needed for wheezing Continue taper Steroids Continue BiPAP as needed and at night; uses BiPAP at Home Continue supplemental oxygen via nasal cannula and wean to home regimen Will need to write for Trelegy inhaler 1 puff daily at discharge whenever he can be safely  discharged; per discussion with the Pulmonology team they are recommending discontinuing his Bevespi Aerosphere and Incruse Ellipta in substitution for the Trelegy  Chronic Diastolic Heart Failure (HCC) No evidence of acute decompensation of heart failure Last TTE 04/18/2022 with LVEF 50-55%, LV with no regional wall trauma maladies, RV mildly enlarged, trivial MR.   Only on lasix PRN at home; will resume if needed  Hypokalemia Replace prn   Macrocytic Anemia Hgb/Hct Trend relatively stable with last Hgb/Hct being 11.5/38.2 Checked Anemia Panel showed an iron level 54, UIBC 258, TIBC 312, saturation ratios of 17%, ferritin level 142, folate of 6.4, vitamin B12 level 488  GERD/GI Prophylaxis Continue Famotidine 20 mg po BID   Hyperlipidemia Continue Rosuvastatin 40 mg po Daily   Severe Protein Calorie Malnutrition Nutrition Status: Nutrition Problem: Severe Malnutrition Etiology: chronic illness (COPD) Signs/Symptoms: severe fat depletion, severe muscle depletion Interventions: Refer to RD note for recommendations, Ensure Enlive (each supplement provides 350kcal and 20 grams of protein), MVI, Liberalize Diet   Hypoalbuminemia Monitor  Homeless Used to Resides at a shelter Plan was for the patient to Return to the [facility] however he was informed by the caseworker that the [facility] has reported concerns with the patient's ability to care for himself and have now told the caseworker that the patient not able to return to the [facility] Not a candidate for SNF Housing voucher has not been approved at this time and the social worker discussed about affordability of the hotel  room which he is now agreeable Discharge disposition pending given the amount of the equipment he needs with a BiPAP at night and as needed, home oxygen and oxygen concentrator as well as nebulizer.      DVT prophylaxis: enoxaparin (LOVENOX) injection 40 mg Start: 04/28/23 2200 SCDs Start:  04/28/23 1706    Code Status: Full Code Family Communication: No family currently at bedside  Disposition Plan:  Level of care: Progressive Status is: Inpatient Remains inpatient appropriate because: Has a unsafe discharge disposition   Consultants:  None  Procedures:  As delineated as above  Antimicrobials:  Anti-infectives (From admission, onward)    Start     Dose/Rate Route Frequency Ordered Stop   04/28/23 1545  cefTRIAXone (ROCEPHIN) 1 g in sodium chloride 0.9 % 100 mL IVPB        1 g 200 mL/hr over 30 Minutes Intravenous  Once 04/28/23 1532 04/28/23 1606   04/28/23 1545  doxycycline (VIBRA-TABS) tablet 100 mg        100 mg Oral  Once 04/28/23 1532 04/28/23 1535         Objective: Vitals:   05/08/23 0414 05/08/23 0924 05/08/23 1300 05/08/23 1949  BP: 96/60  (!) 95/59   Pulse: 67  90   Resp: 20  17   Temp:   98.8 F (37.1 C)   TempSrc:   Oral   SpO2: 99% 95% 91% 95%  Weight:      Height:        Intake/Output Summary (Last 24 hours) at 05/08/2023 1952 Last data filed at 05/08/2023 1600 Gross per 24 hour  Intake 480 ml  Output 1225 ml  Net -745 ml   Filed Weights   04/28/23 1440  Weight: 59 kg   Examination: Physical Exam:  General: NAD  Cardiovascular: S1, S2 present Respiratory: Diminished BS b/l with very mild wheezing Abdomen: Soft, nontender, nondistended, bowel sounds present Musculoskeletal: No bilateral pedal edema noted Skin: Normal Psychiatry: Fair mood    Data Reviewed: I have personally reviewed following labs and imaging studies  CBC: Recent Labs  Lab 05/02/23 0407 05/05/23 0417 05/08/23 0410  WBC 8.1 8.3 9.1  NEUTROABS 7.7 5.8 6.6  HGB 11.5* 11.5* 11.1*  HCT 36.8* 38.2* 35.8*  MCV 101.7* 105.2* 103.2*  PLT 159 203 220   Basic Metabolic Panel: Recent Labs  Lab 05/02/23 0407 05/05/23 0417 05/08/23 0410  NA 135 141 137  K 4.2 4.1 3.3*  CL 89* 91* 91*  CO2 37* 42* 38*  GLUCOSE 132* 92 82  BUN 17 22 17   CREATININE  0.50* 0.54* 0.42*  CALCIUM 8.7* 8.9 8.3*  MG 2.1 2.1 2.1  PHOS 4.2 4.4 4.4   GFR: Estimated Creatinine Clearance: 78.9 mL/min (A) (by C-G formula based on SCr of 0.42 mg/dL (L)). Liver Function Tests: Recent Labs  Lab 05/02/23 0407 05/05/23 0417 05/08/23 0410  AST 14* 18 17  ALT 18 28 29   ALKPHOS 45 43 43  BILITOT 0.5 0.4 0.5  PROT 6.7 6.5 6.1*  ALBUMIN 3.4* 3.3* 3.2*   No results for input(s): "LIPASE", "AMYLASE" in the last 168 hours. No results for input(s): "AMMONIA" in the last 168 hours. Coagulation Profile: No results for input(s): "INR", "PROTIME" in the last 168 hours. Cardiac Enzymes: No results for input(s): "CKTOTAL", "CKMB", "CKMBINDEX", "TROPONINI" in the last 168 hours. BNP (last 3 results) No results for input(s): "PROBNP" in the last 8760 hours. HbA1C: No results for input(s): "HGBA1C" in the last 72 hours.  CBG: No results for input(s): "GLUCAP" in the last 168 hours. Lipid Profile: No results for input(s): "CHOL", "HDL", "LDLCALC", "TRIG", "CHOLHDL", "LDLDIRECT" in the last 72 hours. Thyroid Function Tests: No results for input(s): "TSH", "T4TOTAL", "FREET4", "T3FREE", "THYROIDAB" in the last 72 hours. Anemia Panel: No results for input(s): "VITAMINB12", "FOLATE", "FERRITIN", "TIBC", "IRON", "RETICCTPCT" in the last 72 hours. Sepsis Labs: No results for input(s): "PROCALCITON", "LATICACIDVEN" in the last 168 hours.  No results found for this or any previous visit (from the past 240 hour(s)).   Radiology Studies: No results found.  Scheduled Meds:  arformoterol  15 mcg Nebulization BID   aspirin EC  81 mg Oral Daily   budesonide (PULMICORT) nebulizer solution  0.25 mg Nebulization BID   docusate sodium  100 mg Oral BID   enoxaparin (LOVENOX) injection  40 mg Subcutaneous Q24H   famotidine  20 mg Oral BID   feeding supplement  237 mL Oral BID BM   guaiFENesin  1,200 mg Oral BID   ipratropium-albuterol  3 mL Nebulization BID   [START ON  05/09/2023] predniSONE  20 mg Oral Daily   Followed by   Melene Muller ON 05/10/2023] predniSONE  10 mg Oral Daily   rosuvastatin  40 mg Oral Daily   Continuous Infusions:   LOS: 10 days   Briant Cedar, MD [facility] Available via Epic secure chat 7am-7pm After these hours, please refer to coverage provider listed on [email or web address] 05/08/2023, 7:52 PM

## 2023-05-08 NOTE — TOC Progression Note (Signed)
Transition of Care [facility]) - Progression Note    Patient Details  Name: Anthony Nelson MRN: [id number] Date of Birth: 03/13/1961  Transition of [facility]) CM/SW Contact  Larrie Kass, LCSW Phone Number: 05/08/2023, 10:29 AM  Clinical Narrative:     CSW received call back from Dylan with [facility], he reported pt will not qualify for LTC Medicaid. The facility is unable to offer pt a LTC bed at this time.   CSW met with the pt to provide an update. The hospital has exhausted all resources for this pt. CSW explained that the only option is to get a hotel room. [facility]. CSW validated pt's feelings and encouraged pt to continue to work with the [employer] SW as they asses his ability to live independently. Pt has agreed to d/c to a hotel. He reported that he would only have help with transportation and collecting his belongings from the shelter, maybe tomorrow afternoon. PT stated he is looking at d/cin to the [facility]; CSW informed PT that she would provide this information to the [employer] SW Lias to arrange an assessment. CSW will contact [facility]'s [facility] SW Heather to have her start the application for aide services. TOC to follow.    Expected Discharge Plan and Services       Living arrangements for the past 2 months: Boarding House                                       Social Determinants of Health (SDOH) Interventions SDOH Screenings   Food Insecurity: No Food Insecurity (04/28/2023)  Recent Concern: Food Insecurity - Food Insecurity Present (04/10/2023)  Housing: Low Risk  (04/28/2023)  Recent Concern: Housing - Medium Risk (04/10/2023)  Transportation Needs: No Transportation Needs (04/29/2023)  Recent Concern: Transportation Needs - Unmet Transportation Needs (04/28/2023)  Utilities: Not At Risk (04/28/2023)  Alcohol Screen: Low Risk  (12/26/2022)  Depression (PHQ2-9): Low Risk  (01/16/2023)  Physical Activity: Insufficiently  Active (12/26/2022)  Social Connections: Moderately Isolated (12/26/2022)  Stress: No Stress Concern Present (12/26/2022)  Tobacco Use: Medium Risk (04/28/2023)    Readmission Risk Interventions     No data to display

## 2023-05-08 NOTE — Progress Notes (Signed)
Occupational Therapy Treatment Patient Details Name: Anthony Nelson MRN: [id number] DOB: 07-02-1961 Today's Date: 05/08/2023   History of present illness 62 yo male admitted with COPD exacerbation. PMH: COPD with O2 use, heart failure, pulmonary HTN   OT comments  The pt was seen for energy conservation instruction, progression of functional activity, and functional endurance training. He performed sit to stand and ambulation in the hall using a RW with supervision. He required instruction on implementing deep breathing exercises as needed, as well as incorporating therapeutic rest breaks throughout the session. His O2 saturation was 90% on 3L O2 at rest and 86% on 4L O2 with activity. He will benefit from 1 additional OT session during his hospital stay to reinforce education on implementing energy conservation strategies during ADLs/IADLs.       If plan is discharge home, recommend the following:  Assist for transportation;Help with stairs or ramp for entrance   Equipment Recommendations  Tub/shower seat    Recommendations for Other Services      Precautions / Restrictions Precautions Precaution Comments: monitor O2 Restrictions Weight Bearing Restrictions: No       Mobility Bed Mobility      General bed mobility comments: Pt was received seated EOB    Transfers Overall transfer level: Modified independent Equipment used: Rolling walker (2 wheels) Transfers: Sit to/from Stand Sit to Stand: Modified independent (Device/Increase time)                     ADL either performed or assessed with clinical judgement   ADL Overall ADL's : Needs assistance/impaired Eating/Feeding: Independent Eating/Feeding Details (indicate cue type and reason): based on clinical judgement             Vision Baseline Vision/History: 1 Wears glasses            Cognition Arousal: Alert Behavior During Therapy: WFL for tasks assessed/performed Overall Cognitive Status: Within  Functional Limits for tasks assessed        General Comments: Oriented x4, able to follow commands                   Pertinent Vitals/ Pain       Pain Assessment Pain Assessment: No/denies pain         Frequency  Min 1X/week        Progress Toward Goals  OT Goals(current goals can now be found in the care plan section)  Progress towards OT goals: Progressing toward goals  Acute Rehab OT Goals Patient Stated Goal: to obtain suitable housing OT Goal Formulation: With patient Time For Goal Achievement: 05/17/23 Potential to Achieve Goals: Good  Plan         AM-PAC OT "6 Clicks" Daily Activity     Outcome Measure   Help from another person eating meals?: None Help from another person taking care of personal grooming?: None Help from another person toileting, which includes using toliet, bedpan, or urinal?: None Help from another person bathing (including washing, rinsing, drying)?: A Little Help from another person to put on and taking off regular upper body clothing?: None Help from another person to put on and taking off regular lower body clothing?: None 6 Click Score: 23    End of Session Equipment Utilized During Treatment: Oxygen;Rolling walker (2 wheels)  OT Visit Diagnosis: Unsteadiness on feet (R26.81);Muscle weakness (generalized) (M62.81)   Activity Tolerance Other (comment) (Limited by compromised endurance)   Patient Left in chair;with call bell/phone within reach  Nurse Communication Mobility status        Time: 1434-1500 OT Time Calculation (min): 26 min  Charges: OT General Charges $OT Visit: 1 Visit OT Treatments $Therapeutic Activity: 23-37 mins    Reuben Likes, OTR/L 05/08/2023, 3:59 PM

## 2023-05-08 NOTE — Progress Notes (Signed)
   05/08/23 0315  BiPAP/CPAP/SIPAP  BiPAP/CPAP/SIPAP Pt Type Adult  BiPAP/CPAP/SIPAP V60  Mask Type Full face mask  Mask Size Medium  Set Rate 12 breaths/min  Respiratory Rate 19 breaths/min  IPAP 12 cmH20  EPAP 5 cmH2O  FiO2 (%) 35 %  Minute Ventilation 9.7  Leak 6  Peak Inspiratory Pressure (PIP) 12  Tidal Volume (Vt) 510  Patient Home Equipment No  Auto Titrate No  Press High Alarm 30 cmH2O  Press Low Alarm 5 cmH2O

## 2023-05-09 ENCOUNTER — Other Ambulatory Visit: Payer: Self-pay

## 2023-05-09 ENCOUNTER — Other Ambulatory Visit (HOSPITAL_COMMUNITY): Payer: Self-pay

## 2023-05-09 DIAGNOSIS — J441 Chronic obstructive pulmonary disease with (acute) exacerbation: Secondary | ICD-10-CM | POA: Diagnosis not present

## 2023-05-09 LAB — BASIC METABOLIC PANEL
Anion gap: 9 (ref 5–15)
BUN: 17 mg/dL (ref 8–23)
CO2: 39 mmol/L — ABNORMAL HIGH (ref 22–32)
Calcium: 8.4 mg/dL — ABNORMAL LOW (ref 8.9–10.3)
Chloride: 89 mmol/L — ABNORMAL LOW (ref 98–111)
Creatinine, Ser: 0.53 mg/dL — ABNORMAL LOW (ref 0.61–1.24)
GFR, Estimated: >60 mL/min (ref >60)
Glucose, Bld: 196 mg/dL — ABNORMAL HIGH (ref 70–99)
Potassium: 3.2 mmol/L — ABNORMAL LOW (ref 3.5–5.1)
Sodium: 137 mmol/L (ref 135–145)

## 2023-05-09 MED ORDER — TRELEGY ELLIPTA 200-62.5-25 MCG/ACT IN AEPB
1.0000 | INHALATION_SPRAY | Freq: Every day | RESPIRATORY_TRACT | 0 refills | Status: DC
  Filled 2023-05-09: qty 60, 30d supply, fill #0

## 2023-05-09 MED ORDER — PREDNISONE 10 MG PO TABS
10.0000 mg | ORAL_TABLET | Freq: Every day | ORAL | 0 refills | Status: AC
  Filled 2023-05-09: qty 3, 3d supply, fill #0

## 2023-05-09 MED ORDER — ASPIRIN 81 MG PO TBEC
81.0000 mg | DELAYED_RELEASE_TABLET | Freq: Every day | ORAL | 0 refills | Status: DC
  Filled 2023-05-09: qty 90, 90d supply, fill #0

## 2023-05-09 MED ORDER — FAMOTIDINE 20 MG PO TABS
20.0000 mg | ORAL_TABLET | Freq: Two times a day (BID) | ORAL | 0 refills | Status: DC
  Filled 2023-05-09: qty 60, 30d supply, fill #0

## 2023-05-09 MED ORDER — GUAIFENESIN ER 600 MG PO TB12
1200.0000 mg | ORAL_TABLET | Freq: Two times a day (BID) | ORAL | 0 refills | Status: AC
  Filled 2023-05-09: qty 40, 10d supply, fill #0

## 2023-05-09 MED ORDER — DOCUSATE SODIUM 100 MG PO CAPS
100.0000 mg | ORAL_CAPSULE | Freq: Two times a day (BID) | ORAL | 0 refills | Status: DC
  Filled 2023-05-09: qty 10, 5d supply, fill #0

## 2023-05-09 MED ORDER — POTASSIUM CHLORIDE CRYS ER 20 MEQ PO TBCR
40.0000 meq | EXTENDED_RELEASE_TABLET | Freq: Two times a day (BID) | ORAL | Status: AC
  Administered 2023-05-09 (×2): 40 meq via ORAL
  Filled 2023-05-09 (×2): qty 2

## 2023-05-09 NOTE — Progress Notes (Signed)
PROGRESS NOTE    Anthony Nelson  WUJ:[id number] DOB: 09-Oct-1960 DOA: 04/28/2023 PCP: Anthony Frisk, MD   Brief Narrative:   Anthony Nelson is a 62 yo male with PMH COPD, chronic hypoxia on 2L 02, pulmonary hypertension, GERD, diastolic CHF and other comorbidities who presented with shortness of breath. Symptoms have been ongoing for several days and he began having nonproductive cough and sensation of difficulty clearing sputum but he feels more short winded. Slightly hypoxic on admission, 89% and placed on 3 L.  VBG notable for hypercarbia and pH 7.28.  He was placed on BiPAP and admitted for further workup and monitoring.  He is slowly improving however he continues to desaturate but continues to improve daily. Needs at least 4 L of O2 while ambulating. [facility] to be [facility] on [facility], [facility], however was informed that he is not able to return.  Presently does not have a safe discharge disposition and the [facility] team is trying to find a safe discharge disposition for him. Pt has agreed to stay in a motel for now    Today, denies any new complaints     Assessment and Plan:   COPD with Acute Exacerbation (HCC), improving Chronic Respiratory Failure with Hypoxia and Hypercapnia (HCC) Desaturates and when working with PT the patient dropped to 86% just getting to the edge of the bed. Dropped to 88% on 4lnc with ambulation. Added guaifenesin 1200 ohms p.o. twice daily and continue spirometry and flutter valve Continue with breathing treatments with DuoNeb 3 mL 3 times daily and add Brovana 15 mcg neb twice daily and budesonide 0.25 mg p.o. twice daily; also has albuterol 2.5 mg Neb every 2 as needed for wheezing Continue taper Steroids Continue BiPAP as needed and at night; uses BiPAP at Home Continue supplemental oxygen via nasal cannula and wean to home regimen Will need to write for Trelegy inhaler 1 puff daily at discharge whenever he can be safely  discharged; per discussion with the Pulmonology team they are recommending discontinuing his Bevespi Aerosphere and Incruse Ellipta in substitution for the Trelegy  Chronic Diastolic Heart Failure (HCC) No evidence of acute decompensation of heart failure Last TTE 04/18/2022 with LVEF 50-55%, LV with no regional wall trauma maladies, RV mildly enlarged, trivial MR.   Only on lasix PRN at home; will resume if needed  Hypokalemia Replace prn   Macrocytic Anemia Hgb/Hct Trend relatively stable with last Hgb/Hct being 11.5/38.2 Checked Anemia Panel showed an iron level 54, UIBC 258, TIBC 312, saturation ratios of 17%, ferritin level 142, folate of 6.4, vitamin B12 level 488  GERD/GI Prophylaxis Continue Famotidine 20 mg po BID   Hyperlipidemia Continue Rosuvastatin 40 mg po Daily   Severe Protein Calorie Malnutrition Nutrition Status: Nutrition Problem: Severe Malnutrition Etiology: chronic illness (COPD) Signs/Symptoms: severe fat depletion, severe muscle depletion Interventions: Refer to RD note for recommendations, Ensure Enlive (each supplement provides 350kcal and 20 grams of protein), MVI, Liberalize Diet   Hypoalbuminemia Monitor  Homeless Used to Resides at a shelter Plan was for the patient to Return to the [facility] however he was informed by the caseworker that the [facility] has reported concerns with the patient's ability to care for himself and have now told the caseworker that the patient not able to return to the [facility] Not a candidate for SNF Housing voucher has not been approved at this time and the social worker discussed about affordability of the hotel  room which he is now agreeable Discharge disposition pending given the amount of the equipment he needs with a BiPAP at night and as needed, home oxygen and oxygen concentrator as well as nebulizer.      DVT prophylaxis: enoxaparin (Anthony Nelson) injection 40 mg Start: 04/28/23 2200 SCDs Start:  04/28/23 1706    Code Status: Full Code Family Communication: No family currently at bedside  Disposition Plan:  Level of care: Progressive Status is: Inpatient Remains inpatient appropriate because: Has a unsafe discharge disposition   Consultants:  None  Procedures:  As delineated as above  Antimicrobials:  Anti-infectives (From admission, onward)    Start     Dose/Rate Route Frequency Ordered Stop   04/28/23 1545  cefTRIAXone (ROCEPHIN) 1 g in sodium chloride 0.9 % 100 mL IVPB        1 g 200 mL/hr over 30 Minutes Intravenous  Once 04/28/23 1532 04/28/23 1606   04/28/23 1545  doxycycline (VIBRA-TABS) tablet 100 mg        100 mg Oral  Once 04/28/23 1532 04/28/23 1535         Objective: Vitals:   05/08/23 2004 05/09/23 0406 05/09/23 0753 05/09/23 1220  BP: 111/86 114/70  97/64  Pulse: 87 96  89  Resp: 17 (!) 22    Temp: 98.8 F (37.1 C) 98.2 F (36.8 C)  98.4 F (36.9 C)  TempSrc: Oral Oral  Oral  SpO2: 97% 96% 95% 95%  Weight:      Height:        Intake/Output Summary (Last 24 hours) at 05/09/2023 1337 Last data filed at 05/09/2023 1100 Gross per 24 hour  Intake 240 ml  Output 1725 ml  Net -1485 ml   Filed Weights   04/28/23 1440  Weight: 59 kg   Examination: Physical Exam:  General: NAD  Cardiovascular: S1, S2 present Respiratory: Diminished BS b/l with very mild wheezing Abdomen: Soft, nontender, nondistended, bowel sounds present Musculoskeletal: No bilateral pedal edema noted Skin: Normal Psychiatry: Fair mood    Data Reviewed: I have personally reviewed following labs and imaging studies  CBC: Recent Labs  Lab 05/05/23 0417 05/08/23 0410  WBC 8.3 9.1  NEUTROABS 5.8 6.6  HGB 11.5* 11.1*  HCT 38.2* 35.8*  MCV 105.2* 103.2*  PLT 203 220   Basic Metabolic Panel: Recent Labs  Lab 05/05/23 0417 05/08/23 0410 05/09/23 0422  NA 141 137 137  K 4.1 3.3* 3.2*  CL 91* 91* 89*  CO2 42* 38* 39*  GLUCOSE 92 82 196*  BUN 22 17 17    CREATININE 0.54* 0.42* 0.53*  CALCIUM 8.9 8.3* 8.4*  MG 2.1 2.1  --   PHOS 4.4 4.4  --    GFR: Estimated Creatinine Clearance: 78.9 mL/min (A) (by C-G formula based on SCr of 0.53 mg/dL (L)). Liver Function Tests: Recent Labs  Lab 05/05/23 0417 05/08/23 0410  AST 18 17  ALT 28 29  ALKPHOS 43 43  BILITOT 0.4 0.5  PROT 6.5 6.1*  ALBUMIN 3.3* 3.2*   No results for input(s): "LIPASE", "AMYLASE" in the last 168 hours. No results for input(s): "AMMONIA" in the last 168 hours. Coagulation Profile: No results for input(s): "INR", "PROTIME" in the last 168 hours. Cardiac Enzymes: No results for input(s): "CKTOTAL", "CKMB", "CKMBINDEX", "TROPONINI" in the last 168 hours. BNP (last 3 results) No results for input(s): "PROBNP" in the last 8760 hours. HbA1C: No results for input(s): "HGBA1C" in the last 72 hours. CBG: No results for input(s): "GLUCAP"  in the last 168 hours. Lipid Profile: No results for input(s): "CHOL", "HDL", "LDLCALC", "TRIG", "CHOLHDL", "LDLDIRECT" in the last 72 hours. Thyroid Function Tests: No results for input(s): "TSH", "T4TOTAL", "FREET4", "T3FREE", "THYROIDAB" in the last 72 hours. Anemia Panel: No results for input(s): "VITAMINB12", "FOLATE", "FERRITIN", "TIBC", "IRON", "RETICCTPCT" in the last 72 hours. Sepsis Labs: No results for input(s): "PROCALCITON", "LATICACIDVEN" in the last 168 hours.  No results found for this or any previous visit (from the past 240 hour(s)).   Radiology Studies: No results found.  Scheduled Meds:  arformoterol  15 mcg Nebulization BID   aspirin EC  81 mg Oral Daily   budesonide (PULMICORT) nebulizer solution  0.25 mg Nebulization BID   docusate sodium  100 mg Oral BID   enoxaparin (Anthony Nelson) injection  40 mg Subcutaneous Q24H   famotidine  20 mg Oral BID   feeding supplement  237 mL Oral BID BM   guaiFENesin  1,200 mg Oral BID   ipratropium-albuterol  3 mL Nebulization BID   potassium chloride  40 mEq Oral BID    [START ON 05/10/2023] predniSONE  10 mg Oral Daily   rosuvastatin  40 mg Oral Daily   Continuous Infusions:   LOS: 11 days   Briant Cedar, MD [facility] Available via Epic secure chat 7am-7pm After these hours, please refer to coverage provider listed on [email or web address] 05/09/2023, 1:37 PM

## 2023-05-09 NOTE — Progress Notes (Signed)
Mobility Specialist - Progress Note   05/09/23 1049  Oxygen Therapy  O2 Device Nasal Cannula  O2 Flow Rate (L/min) 4 L/min  Patient Activity (if Appropriate) Ambulating  Mobility  Activity Ambulated with assistance in hallway  Level of Assistance Standby assist, set-up cues, supervision of patient - no hands on  Assistive Device Front wheel walker  Distance Ambulated (ft) 350 ft  Activity Response Tolerated well  Mobility Referral Yes  $Mobility charge 1 Mobility  Mobility Specialist Start Time (ACUTE ONLY) 1027  Mobility Specialist Stop Time (ACUTE ONLY) 1048  Mobility Specialist Time Calculation (min) (ACUTE ONLY) 21 min   Pt received in bed and agreeable to mobility. Pt took x3 standing rest breaks during ambulation d/t fatigue. No complaints during session. Unable to obtain appropriate SpO2 reading d/t faulty pulse ox. Pt to bed after session with all needs met.   Pre-mobility: 92% SpO2 (3L [place])   Chief Technology Officer

## 2023-05-09 NOTE — Progress Notes (Signed)
RT went to place pt on bipap for the night, pt stated he wanted to take a break and not wear machine tonight. Pt resting comfortably at this time.

## 2023-05-09 NOTE — Plan of Care (Signed)

## 2023-05-09 NOTE — Care Management Important Message (Signed)
Important Message  Patient Details  Name: Anthony Nelson MRN: [id number] Date of Birth: Jun 17, 1961   Important Message Given:  Yes - Medicare and [employer] IM     Caren Macadam 05/09/2023, 3:34 PM

## 2023-05-09 NOTE — Plan of Care (Signed)
  Problem: Education: Goal: Knowledge of General Education information will improve Description: Including pain rating scale, medication(s)/side effects and non-pharmacologic comfort measures Outcome: Progressing   Problem: Clinical Measurements: Goal: Ability to maintain clinical measurements within normal limits will improve Outcome: Progressing Goal: Respiratory complications will improve Outcome: Progressing   Problem: Activity: Goal: Risk for activity intolerance will decrease Outcome: Progressing   Problem: Nutrition: Goal: Adequate nutrition will be maintained Outcome: Progressing   

## 2023-05-09 NOTE — Progress Notes (Signed)
Discharge medications delivered from [facility] by this RN - pt in room - this RN reviewed meds w/ pt - no other questions at this time. Meds in a secure bag - placed on bedside table.

## 2023-05-09 NOTE — Discharge Summary (Signed)
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 is a 62 yo male with PMH COPD, chronic hypoxia on 2L 02, pulmonary hypertension, GERD, diastolic CHF and other comorbidities who presented with shortness of breath. Symptoms have been ongoing for several days and he began having nonproductive cough and sensation of difficulty clearing sputum but he feels more short winded. Slightly hypoxic on admission, 89% and placed on 3 L. VBG notable for hypercarbia and pH 7.28. He was placed on BiPAP and admitted for further workup and monitoring. He is slowly improving however he continues to desaturate but continues to improve daily. Needs at least 4 L of O2 while ambulating. Patient was to be discharged back to the service center house on Friday, November 1, however was informed that he is not able to return. Patient only option is a motel of which he has previously stayed in and is agreeable to discharge there. Pt stated his medical equipment will be transported by his friend. Pt will continue to look for housing.   Today, denies any new complaints, is agreeable to going to a motel as mentioned above, denies any concerns and states that he will be able to care for himself. High risk for readmission.    Assessment and  Plan:  COPD with Acute Exacerbation (HCC), improving Chronic Respiratory Failure with Hypoxia and Hypercapnia (HCC) Continue spirometry and flutter valve Continue with nebs, tapered steroids, started on trelegy as rec by pulmonology Continue BiPAP as needed and at night; uses BiPAP at Home Continue supplemental oxygen via nasal cannula   Chronic Diastolic Heart Failure (HCC) No evidence of acute decompensation of heart failure Last TTE 04/18/2022 with LVEF 50-55%, LV with no regional wall trauma maladies, RV mildly enlarged, trivial MR.   Only on lasix PRN at home, resume   Hypokalemia Replace prn   Macrocytic Anemia Hgb/Hct Trend relatively stable with last Hgb/Hct being 11.5/38.2 Checked Anemia Panel showed an iron level 54, UIBC 258, TIBC 312, saturation ratios of 17%, ferritin level 142, folate of 6.4, vitamin B12 level 488   GERD/GI Prophylaxis Continue Famotidine 20 mg po BID   Hyperlipidemia Continue Rosuvastatin 40 mg po Daily    Severe Protein Calorie Malnutrition Nutrition Status: Nutrition Problem: Severe Malnutrition Etiology: chronic illness (COPD) Signs/Symptoms: severe fat depletion, severe muscle depletion Interventions: Refer to RD note for recommendations, Ensure Enlive (each supplement provides 350kcal and 20 grams of protein), MVI, Liberalize Diet   Hypoalbuminemia Monitor   Homeless Used to Resides at a shelter Plan was for the patient to Return to the [facility] [facility] however he was informed by the caseworker that the [facility] has reported concerns with the patient's ability to care for himself and have now told the caseworker that the patient not able to return to the [facility] Not a candidate for [facility] Housing voucher has not  been approved at this time and the social worker discussed about affordability of the hotel room which he is now agreeable       Consultants: None Procedures performed: None Disposition:  Motel as patient is  currently homeless Diet recommendation:  Regular diet   DISCHARGE MEDICATION: Allergies as of 05/09/2023       Reactions   Erythromycin Other (See Comments)   Allergy from childhood- does not take it        Medication List     STOP taking these medications    B-12 PO   Bevespi Aerosphere 9-4.8 MCG/ACT Aero Generic drug: Glycopyrrolate-Formoterol   dextromethorphan-guaiFENesin 30-600 MG 12hr tablet Commonly known as: MUCINEX DM   Incruse Ellipta 62.5 MCG/ACT Aepb Generic drug: umeclidinium bromide   potassium chloride SA 20 MEQ tablet Commonly known as: KLOR-CON M   vitamin C 100 MG tablet       TAKE these medications    albuterol (2.5 MG/3ML) 0.083% nebulizer solution Commonly known as: PROVENTIL USE 3 MLS BY NEBULIZATION EVERY 6 HOURS AS NEEDED FOR WHEEZING OR SHORTNESS OF BREATH.   Ventolin HFA 108 (90 Base) MCG/ACT inhaler Generic drug: albuterol Inhale 2 puffs into the lungs every 6 hours as needed for wheezing or shortness of breath.   aspirin EC 81 MG tablet Take 1 tablet (81 mg total) by mouth daily. Swallow whole. Start taking on: May 10, 2023 What changed: Another medication with the same name was removed. Continue taking this medication, and follow the directions you see here.   docusate sodium 100 MG capsule Commonly known as: COLACE Take 1 capsule (100 mg total) by mouth 2 (two) times daily. What changed: when to take this   famotidine 20 MG tablet Commonly known as: PEPCID Take 1 tablet (20 mg total) by mouth 2 (two) times daily. What changed: how much to take   furosemide 20 MG tablet Commonly known as: LASIX Take 1 tablet (20 mg total) by mouth daily as needed. What changed: reasons to take this   GINSENG PO Take 1 capsule by mouth daily.   guaiFENesin 600 MG 12 hr tablet Commonly known as: MUCINEX Take 2 tablets (1,200 mg total) by mouth 2 (two) times daily for 10 days.   multivitamin with minerals tablet Take 1 tablet by  mouth daily with breakfast.   OXYGEN Inhale 2 L/min into the lungs continuous.   predniSONE 10 MG tablet Commonly known as: DELTASONE Take 1 tablet (10 mg total) by mouth daily for 3 days. Start taking on: May 10, 2023   rosuvastatin 40 MG tablet Commonly known as: CRESTOR Take 1 tablet (40 mg) by mouth daily.   sodium chloride 0.65 % Soln nasal spray Commonly known as: OCEAN Place 2 sprays into both nostrils in the morning, at noon, and at bedtime. What changed:  when to take this reasons to take this   Trelegy Ellipta 200-62.5-25 MCG/ACT Aepb Generic drug: Fluticasone-Umeclidin-Vilant Inhale 1 puff into the lungs daily.   Vitamin D3 50 MCG (2000 UT) Tabs Take 2,000 Units by mouth daily.   VITAMIN E PO Take 1 capsule by mouth daily.        Follow-up Information     Storm Frisk, MD. Schedule an appointment as soon as possible for a visit in 1 week(s).   Specialty: Pulmonary Disease Contact information: [address] [place] [address] [phone]                Discharge Exam:  Filed Weights   04/28/23 1440  Weight: 59 kg   General: NAD  Cardiovascular: S1, S2 present Respiratory: Diminished BS b/l Abdomen: Soft, nontender, nondistended, bowel sounds present Musculoskeletal: No bilateral pedal edema noted Skin: Normal Psychiatry: Normal mood   Condition at discharge: stable  The results of significant diagnostics from this hospitalization (including imaging, microbiology, ancillary and laboratory) are listed below for reference.   Imaging Studies: DG CHEST PORT 1 VIEW  Result Date: 05/02/2023 CLINICAL DATA:  Shortness of breath. EXAM: PORTABLE CHEST 1 VIEW COMPARISON:  April 28, 2023. FINDINGS: The heart size and mediastinal contours are within normal limits. Both lungs are clear. Hyperinflation of the lungs is noted. The visualized skeletal structures are unremarkable. IMPRESSION: No active disease. Electronically  Signed   By: Lupita Raider M.D.   On: 05/02/2023 10:50   DG Chest Port 1 View  Result Date: 04/28/2023 CLINICAL DATA:  dyspnea EXAM: PORTABLE CHEST 1 VIEW COMPARISON:  April 09, 2023 FINDINGS: The cardiomediastinal silhouette is unchanged in contour.Emphysematous changes. No pleural effusion. No pneumothorax. No acute pleuroparenchymal abnormality. IMPRESSION: No acute cardiopulmonary abnormality. Given underlying emphysema, recommend evaluation for candidacy for annual lung cancer screening CT. Electronically Signed   By: Meda Klinefelter M.D.   On: 04/28/2023 16:25    Microbiology: Results for orders placed or performed during the hospital encounter of 04/28/23  Resp panel by RT-PCR (RSV, Flu A&B, Covid) Anterior Nasal Swab     Status: None   Collection Time: 04/28/23  3:05 PM   Specimen: Anterior Nasal Swab  Result Value Ref Range Status   SARS Coronavirus 2 by RT PCR NEGATIVE NEGATIVE Final    Comment: (NOTE) SARS-CoV-2 target nucleic acids are NOT DETECTED.  The SARS-CoV-2 RNA is generally detectable in upper respiratory specimens during the acute phase of infection. The lowest concentration of SARS-CoV-2 viral copies this assay can detect is 138 copies/mL. A negative result does not preclude SARS-Cov-2 infection and should not be used as the sole basis for treatment or other patient management decisions. A negative result may occur with  improper specimen collection/handling, submission of specimen other than nasopharyngeal swab, presence of viral mutation(s) within the areas targeted by this assay, and inadequate number of viral copies(<138 copies/mL). A negative result must be combined with clinical observations, patient history, and epidemiological information. The expected result is Negative.  Fact Sheet for Patients:  BloggerCourse.com  Fact Sheet for Healthcare Providers:  SeriousBroker.it  This test is no t yet  approved or cleared by the [place] FDA and  has been authorized for detection and/or diagnosis of SARS-CoV-2 by FDA under an Emergency Use Authorization (EUA). This EUA will remain  in effect (meaning this test can be used) for the duration of the COVID-19 declaration under Section 564(b)(1) of the Act, 21 U.S.C.section 360bbb-3(b)(1), unless the authorization is terminated  or revoked sooner.       Influenza A by PCR NEGATIVE NEGATIVE Final   Influenza B by PCR NEGATIVE NEGATIVE Final    Comment: (NOTE) The Xpert Xpress SARS-CoV-2/FLU/RSV plus assay is intended as an aid in the diagnosis of influenza from Nasopharyngeal swab specimens and should not be used as a sole basis for treatment. Nasal washings and aspirates are unacceptable for Xpert Xpress SARS-CoV-2/FLU/RSV testing.  Fact Sheet for Patients: BloggerCourse.com  Fact Sheet for Healthcare Providers: SeriousBroker.it  This test is not yet approved or cleared by the [place] FDA and has been authorized for detection and/or diagnosis of SARS-CoV-2 by FDA  under an Emergency Use Authorization (EUA). This EUA will remain in effect (meaning this test can be used) for the duration of the COVID-19 declaration under Section 564(b)(1) of the Act, 21 U.S.C. section 360bbb-3(b)(1), unless the authorization is terminated or revoked.     Resp Syncytial Virus by PCR NEGATIVE NEGATIVE Final    Comment: (NOTE) Fact Sheet for Patients: BloggerCourse.com  Fact Sheet for Healthcare Providers: SeriousBroker.it  This test is not yet approved or cleared by the [place] FDA and has been authorized for detection and/or diagnosis of SARS-CoV-2 by FDA under an Emergency Use Authorization (EUA). This EUA will remain in effect (meaning this test can be used) for the duration of the COVID-19 declaration under Section 564(b)(1) of  the Act, 21 U.S.C. section 360bbb-3(b)(1), unless the authorization is terminated or revoked.  Performed at [facility], [address], [place], [place] [address]   Respiratory (~20 pathogens) panel by PCR     Status: None   Collection Time: 04/28/23  3:05 PM   Specimen: Nasopharyngeal Swab; Respiratory  Result Value Ref Range Status   Adenovirus NOT DETECTED NOT DETECTED Final   Coronavirus 229E NOT DETECTED NOT DETECTED Final    Comment: (NOTE) The Coronavirus on the Respiratory Panel, DOES NOT test for the novel  Coronavirus (2019 nCoV)    Coronavirus HKU1 NOT DETECTED NOT DETECTED Final   Coronavirus NL63 NOT DETECTED NOT DETECTED Final   Coronavirus OC43 NOT DETECTED NOT DETECTED Final   Metapneumovirus NOT DETECTED NOT DETECTED Final   Rhinovirus / Enterovirus NOT DETECTED NOT DETECTED Final   Influenza A NOT DETECTED NOT DETECTED Final   Influenza B NOT DETECTED NOT DETECTED Final   Parainfluenza Virus 1 NOT DETECTED NOT DETECTED Final   Parainfluenza Virus 2 NOT DETECTED NOT DETECTED Final   Parainfluenza Virus 3 NOT DETECTED NOT DETECTED Final   Parainfluenza Virus 4 NOT DETECTED NOT DETECTED Final   Respiratory Syncytial Virus NOT DETECTED NOT DETECTED Final   Bordetella pertussis NOT DETECTED NOT DETECTED Final   Bordetella Parapertussis NOT DETECTED NOT DETECTED Final   Chlamydophila pneumoniae NOT DETECTED NOT DETECTED Final   Mycoplasma pneumoniae NOT DETECTED NOT DETECTED Final    Comment: Performed at [facility], [address] [place], [place] [address]  MRSA Next Gen by PCR, Nasal     Status: None   Collection Time: 04/28/23  8:06 PM   Specimen: Nasal Mucosa; Nasal Swab  Result Value Ref Range Status   MRSA by PCR Next Gen NOT DETECTED NOT DETECTED Final    Comment: (NOTE) The GeneXpert MRSA Assay (FDA approved for NASAL specimens only), is one component of a comprehensive MRSA colonization surveillance program. It is not  intended to diagnose MRSA infection nor to guide or monitor treatment for MRSA infections. Test performance is not FDA approved in patients less than 56 years old. Performed at [facility], [address], [place], [place] [address]     Labs: CBC: Recent Labs  Lab 05/05/23 0417 05/08/23 0410  WBC 8.3 9.1  NEUTROABS 5.8 6.6  HGB 11.5* 11.1*  HCT 38.2* 35.8*  MCV 105.2* 103.2*  PLT 203 220   Basic Metabolic Panel: Recent Labs  Lab 05/05/23 0417 05/08/23 0410 05/09/23 0422  NA 141 137 137  K 4.1 3.3* 3.2*  CL 91* 91* 89*  CO2 42* 38* 39*  GLUCOSE 92 82 196*  BUN 22 17 17   CREATININE 0.54* 0.42* 0.53*  CALCIUM 8.9 8.3*  8.4*  MG 2.1 2.1  --   PHOS 4.4 4.4  --    Liver Function Tests: Recent Labs  Lab 05/05/23 0417 05/08/23 0410  AST 18 17  ALT 28 29  ALKPHOS 43 43  BILITOT 0.4 0.5  PROT 6.5 6.1*  ALBUMIN 3.3* 3.2*   CBG: No results for input(s): "GLUCAP" in the last 168 hours.  Discharge time spent: greater than 30 minutes.  Signed: Briant Cedar, MD [facility] 05/09/2023

## 2023-05-09 NOTE — Progress Notes (Addendum)
Patient to be discharged from hospital to hotel reservation that was made by patient has now "expired", per patient. Patient is working on reserving another hotel. MD made aware. MD also to tell RN to tell patient not to leave hospital until safe disposition is solved. Patient agrees. AVS provided & IV removed.

## 2023-05-09 NOTE — TOC Progression Note (Signed)
Transition of Care [facility]) - Progression Note    Patient Details  Name: Anthony Nelson MRN: [id number] Date of Birth: 05/03/1961  Transition [facility]) CM/SW Contact  Larrie Kass, LCSW Phone Number: 05/09/2023, 1:14 PM  Clinical Narrative:    CSW spoke with pt about an family care home that may have an available bed. Pt is not able to afford the room and board. Pt stated his friend could come to assist him around 5pm. Pt reports he will need a portable O2 tank for d/c. CSW contacted Mitch with [employer]; a portable tank will be delivered to the room prior to d/c.     Expected Discharge Plan: Skilled Nursing Facility Barriers to Discharge: Continued Medical Work up  Expected Discharge Plan and Services       Living arrangements for the past 2 months: Boarding House                                       Social Determinants of Health (SDOH) Interventions SDOH Screenings   Food Insecurity: No Food Insecurity (04/28/2023)  Recent Concern: Food Insecurity - Food Insecurity Present (04/10/2023)  Housing: Low Risk  (04/28/2023)  Recent Concern: Housing - Medium Risk (04/10/2023)  Transportation Needs: No Transportation Needs (04/29/2023)  Recent Concern: Transportation Needs - Unmet Transportation Needs (04/28/2023)  Utilities: Not At Risk (04/28/2023)  Alcohol Screen: Low Risk  (12/26/2022)  Depression (PHQ2-9): Low Risk  (01/16/2023)  Physical Activity: Insufficiently Active (12/26/2022)  Social Connections: Moderately Isolated (12/26/2022)  Stress: No Stress Concern Present (12/26/2022)  Tobacco Use: Medium Risk (04/28/2023)    Readmission Risk Interventions     No data to display

## 2023-05-10 LAB — BASIC METABOLIC PANEL
Anion gap: 9 (ref 5–15)
BUN: 16 mg/dL (ref 8–23)
CO2: 36 mmol/L — ABNORMAL HIGH (ref 22–32)
Calcium: 8.8 mg/dL — ABNORMAL LOW (ref 8.9–10.3)
Chloride: 96 mmol/L — ABNORMAL LOW (ref 98–111)
Creatinine, Ser: 0.45 mg/dL — ABNORMAL LOW (ref 0.61–1.24)
GFR, Estimated: >60 mL/min (ref >60)
Glucose, Bld: 67 mg/dL — ABNORMAL LOW (ref 70–99)
Potassium: 4.1 mmol/L (ref 3.5–5.1)
Sodium: 141 mmol/L (ref 135–145)

## 2023-05-10 NOTE — Progress Notes (Signed)
Physical Therapy Discharge  Patient Details Name: Anthony Nelson MRN: [id number] DOB: February 01, 1961 Today's Date: 05/10/2023   History of Present Illness 62 yo male admitted with COPD exac. Hx of COPD-2L baseline, HF, pulm HTN    PT Comments   Pt admitted with above diagnosis.  Pt currently with functional limitations due to the deficits listed below (see PT Problem List). Pt is mod I with bed mobility, mod I with transfer tasks and short amb bouts no AD. Pt is able to manage [place] tube in personal room. Pt on 3 L/min throughout intervention.Pt indicated mild SOB with gait tasks rollator 500 feet with one standing rest break with S and min cues for safety and rollator brake management. PT is under impression pt will d/c today and will benefit from follow up therapy services. Pt left seated in recliner and all needs in place. Pt will benefit from acute skilled PT to increase their independence and safety with mobility to allow discharge.      If plan is discharge home, recommend the following: Assist for transportation;Help with stairs or ramp for entrance   Can travel by private vehicle        Equipment Recommendations  None recommended by PT    Recommendations for Other Services       Precautions / Restrictions Precautions Precautions: Fall Precaution Comments: monitor O2 Restrictions Weight Bearing Restrictions: No     Mobility  Bed Mobility Overal bed mobility: Modified Independent             General bed mobility comments: pt seated in recliner when PT arrived    Transfers Overall transfer level: Modified independent Equipment used: None Transfers: Sit to/from Stand Sit to Stand: Modified independent (Device/Increase time)                Ambulation/Gait Ambulation/Gait assistance: Supervision Gait Distance (Feet): 500 Feet Assistive device: Rollator (4 wheels) Gait Pattern/deviations: Step-through pattern Gait velocity: sligly decreased     General Gait  Details: S for safety with rollator brake management, pt required one standing theraputic rest break with gait tasks today PT unable to obtain accurate reading with pulse oximiter and pt indicated mild SOB. PT observed pt amb no AD in personal room, managing [place] tube recliner <> commode and no overt LOB   Stairs             Wheelchair Mobility     Tilt Bed    Modified Rankin (Stroke Patients Only)       Balance Overall balance assessment: Mild deficits observed, not formally tested   Sitting balance-Leahy Scale: Normal         Standing balance comment: standing balance G                            Cognition Arousal: Alert Behavior During Therapy: WFL for tasks assessed/performed Overall Cognitive Status: Within Functional Limits for tasks assessed                                 General Comments: Oriented x4, able to follow commands        Exercises      General Comments        Pertinent Vitals/Pain Pain Assessment Faces Pain Scale: Hurts a little bit Pain Location: generalized with movement Pain Descriptors / Indicators: Grimacing, Discomfort, Guarding    Home Living  Prior Function            PT Goals (current goals can now be found in the care plan section) Acute Rehab PT Goals Patient Stated Goal: feel better PT Goal Formulation: With patient Time For Goal Achievement: 05/15/23 Potential to Achieve Goals: Good Progress towards PT goals: Progressing toward goals    Frequency    Min 1X/week      PT Plan      Co-evaluation              AM-PAC PT "6 Clicks" Mobility   Outcome Measure  Help needed turning from your back to your side while in a flat bed without using bedrails?: None Help needed moving from lying on your back to sitting on the side of a flat bed without using bedrails?: None Help needed moving to and from a bed to a chair (including a wheelchair)?:  None Help needed standing up from a chair using your arms (e.g., wheelchair or bedside chair)?: None Help needed to walk in hospital room?: None Help needed climbing 3-5 steps with a railing? : A Little 6 Click Score: 23    End of Session Equipment Utilized During Treatment: Oxygen Activity Tolerance: Patient tolerated treatment well Patient left: with call bell/phone within reach;in chair Nurse Communication: Mobility status PT Visit Diagnosis: Difficulty in walking, not elsewhere classified (R26.2)     Time: 1610-9604 PT Time Calculation (min) (ACUTE ONLY): 24 min  Charges:    $Gait Training: 8-22 mins $Therapeutic Activity: 8-22 mins PT General Charges $$ ACUTE PT VISIT: 1 Visit                     Johnny Bridge, PT Acute Rehab    Jacqualyn Posey 05/10/2023, 12:09 PM

## 2023-05-10 NOTE — Consult Note (Signed)
   05/10/2023  Anthony Nelson 10-23-1960 [id number]  Insurance: [employer]   Primary Care Provider: Storm Frisk, MD, [facility], this provider is listed for the transition of care follow up appointments  and [facility] calls   [facility] Liaison screened the patient remotely at [facility]. Spoke with patient from bedside phone regarding post hospital follow up. HIPAA verified. States he has a follow up coming.    The patient was screened LLOS and  for 30 day readmission hospitalization with noted medium high risk score for unplanned readmission risk 2 hospital admissions in 6 months.  The patient was assessed for potential Community Care Coordination service needs for post hospital transition for care coordination. Review of patient's electronic medical record reveals patient is currently going to a motel due to homelessness.  Noted that he has been outreached by a VBCI TOC telephonically.  Patient confirms he still has a cell phone and number as listed. He states he has oxygen to take and his daughter-in-law is to come pick him up after 3 pm between her jobs.  Plan:   Referral request for community care coordination: will make referral for post hospital care coordination follow up.   VBCI Community Care, Population Health does not replace or interfere with any arrangements made by the Inpatient Transition of Care team.   For questions contact:   Charlesetta Shanks, RN, BSN, CCM [place]  [facility], [facility] Liaison Direct Dial: [phone] or secure chat Email: [email or web address]

## 2023-05-10 NOTE — Plan of Care (Signed)
Discharged to first floor where daughter-in-law is picking him up. CN aware.    Problem: Education: Goal: Knowledge of General Education information will improve Description: Including pain rating scale, medication(s)/side effects and non-pharmacologic comfort measures Outcome: Adequate for Discharge   Problem: Health Behavior/Discharge Planning: Goal: Ability to manage health-related needs will improve Outcome: Adequate for Discharge   Problem: Clinical Measurements: Goal: Ability to maintain clinical measurements within normal limits will improve Outcome: Adequate for Discharge Goal: Will remain free from infection Outcome: Adequate for Discharge Goal: Diagnostic test results will improve Outcome: Adequate for Discharge Goal: Respiratory complications will improve Outcome: Adequate for Discharge Goal: Cardiovascular complication will be avoided Outcome: Adequate for Discharge   Problem: Activity: Goal: Risk for activity intolerance will decrease Outcome: Adequate for Discharge   Problem: Nutrition: Goal: Adequate nutrition will be maintained Outcome: Adequate for Discharge   Problem: Coping: Goal: Level of anxiety will decrease Outcome: Adequate for Discharge   Problem: Elimination: Goal: Will not experience complications related to bowel motility Outcome: Adequate for Discharge Goal: Will not experience complications related to urinary retention Outcome: Adequate for Discharge   Problem: Pain Management: Goal: General experience of comfort will improve Outcome: Adequate for Discharge   Problem: Safety: Goal: Ability to remain free from injury will improve Outcome: Adequate for Discharge   Problem: Skin Integrity: Goal: Risk for impaired skin integrity will decrease Outcome: Adequate for Discharge   Problem: Education: Goal: Knowledge of disease or condition will improve Outcome: Adequate for Discharge Goal: Knowledge of the prescribed therapeutic regimen  will improve Outcome: Adequate for Discharge Goal: Individualized Educational Video(s) Outcome: Adequate for Discharge   Problem: Activity: Goal: Ability to tolerate increased activity will improve Outcome: Adequate for Discharge Goal: Will verbalize the importance of balancing activity with adequate rest periods Outcome: Adequate for Discharge   Problem: Respiratory: Goal: Ability to maintain a clear airway will improve Outcome: Adequate for Discharge Goal: Levels of oxygenation will improve Outcome: Adequate for Discharge Goal: Ability to maintain adequate ventilation will improve Outcome: Adequate for Discharge

## 2023-05-10 NOTE — Progress Notes (Signed)
Pt states he don't want to wear BIPAP tonight.  Pt is sitting up in chair and don't plan on getting into bed tonight.  Pt is aware if he decides he wants to wear his BIPAP he can have RN call RT.

## 2023-05-10 NOTE — TOC Transition Note (Addendum)
Transition of Care [facility]) - CM/SW Discharge Note   Patient Details  Name: Anthony Nelson MRN: [id number] Date of Birth: 04-20-1961  Transition of Care [facility]) CM/SW Contact:  Larrie Kass, LCSW Phone Number: 05/10/2023, 11:11 AM   Clinical Narrative:    Pt reported his Hotel room at the [facility] was given away . Pt reports  looking for a new hotel with a budget of no more than 1500. CSW was able to assist the pt in finding a hotel. Pt reports he can check into the [facility] [address], [place], [place] [address] after 3pm.    [place] CSW spoke with pt regarding Home Health services. Pt has agreed stating whatever will help him get his housing Voucher. Pt has no preference in agency. CSW spoke with Elnita Maxwell with [facility] she is able to accept for HHPT/OT/SW. Will need orders MD made aware, information has been added to AVS.   CSW offered taxi voucher for pt to d/c to hotel , pt declined. Pt wants to wait for his daughter in d/c lounge. Pt's daughter to pick pt up at d/c. TOC sign off.     Final next level of care:  (HOTEL) Barriers to Discharge: No Barriers Identified   Patient Goals and CMS Choice CMS Medicare.gov Compare Post Acute Care list provided to:: Patient Choice offered to / list presented to : Patient  Discharge Placement                      Patient and family notified of of transfer: 05/10/23  Discharge Plan and Services Additional resources added to the After Visit Summary for                                       Social Determinants of Health (SDOH) Interventions SDOH Screenings   Food Insecurity: No Food Insecurity (04/28/2023)  Recent Concern: Food Insecurity - Food Insecurity Present (04/10/2023)  Housing: Low Risk  (04/28/2023)  Recent Concern: Housing - Medium Risk (04/10/2023)  Transportation Needs: No Transportation Needs (04/29/2023)  Recent Concern: Transportation Needs - Unmet Transportation Needs (04/28/2023)  Utilities: Not At  Risk (04/28/2023)  Alcohol Screen: Low Risk  (12/26/2022)  Depression (PHQ2-9): Low Risk  (01/16/2023)  Physical Activity: Insufficiently Active (12/26/2022)  Social Connections: Moderately Isolated (12/26/2022)  Stress: No Stress Concern Present (12/26/2022)  Tobacco Use: Medium Risk (04/28/2023)     Readmission Risk Interventions     No data to display

## 2023-05-13 ENCOUNTER — Other Ambulatory Visit (HOSPITAL_COMMUNITY): Payer: Self-pay

## 2023-05-13 ENCOUNTER — Telehealth: Payer: Self-pay

## 2023-05-13 ENCOUNTER — Telehealth: Payer: Self-pay | Admitting: *Deleted

## 2023-05-13 ENCOUNTER — Inpatient Hospital Stay: Payer: No Typology Code available for payment source | Admitting: Nurse Practitioner

## 2023-05-13 NOTE — Patient Instructions (Signed)
Visit Information  Hi Anthony Nelson,  Thank you for taking time to visit with me today. Please don't hesitate to contact me if I can be of assistance to you before our next scheduled telephone appointment.  Our next appointment is by telephone on 05/21/23 at 2:30pm  Following is a copy of your care plan:   Goals Addressed             This Visit's Progress    Securing housing and complete all pcp visits       Transition of Care       Current Barriers:  Care Coordination needs related to Limited social support, Housing barriers, and Lacks knowledge of community resource: Referral made to SW Chronic Disease Management support and education needs related to COPD   RNCM Clinical Goal(s):  Patient will work with the Care Management team over the next 30 days to address Transition of Care Barriers: Medication access Medication Management Diet/Nutrition/Food Resources Provider appointments Equipment/DME Transportation continue to work with Medical illustrator to address care management and care coordination needs related to  COPD as evidenced by adherence to CM Team Scheduled appointments work with Child psychotherapist to address  related to the management of Financial constraints related to lack of housing stability (currently has 1 month contract with local motel), Limited social support, Transportation, Limited access to food, and Housing barriers related to the management of COPD as evidenced by review of EMR and patient or social worker report not experience hospital admission as evidenced by review of EMR. Hospital Admissions in last 6 months = 2 demonstrate a decrease in COPD exacerbations as evidenced by decreased hospital visits, medication adherence, avoidance of triggers, and plan of care adherence including use of 02 @2L  continuous via nasal cannula experience decrease in ED visits as evidenced by EMR review.  ED visits in in last 6 months = 2  through collaboration with RN Care manager, provider,  and care team.   Interventions: Evaluation of current treatment plan related to  self management and patient's adherence to plan as established by provider   COPD Interventions:  (Status:  New goal.) Short Term Goal Provided patient with basic written and verbal COPD education on self care/management/and exacerbation prevention Advised patient to track and manage COPD triggers Provided written and verbal instructions on pursed lip breathing and utilized returned demonstration as teach back Provided instruction about proper use of medications used for management of COPD including inhalers Advised patient to engage in light exercise as tolerated 3-5 days a week to aid in the the management of COPD Provided education about and advised patient to utilize infection prevention strategies to reduce risk of respiratory infection Discussed the importance of adequate rest and management of fatigue with COPD Assessed social determinant of health barriers  Patient Goals/Self-Care Activities: Participate in Transition of Care Program/Attend TOC scheduled calls Take all medications as prescribed Attend all scheduled provider appointments Call pharmacy for medication refills 3-7 days in advance of running out of medications Perform all self care activities independently  Perform IADL's (shopping, preparing meals, housekeeping, managing finances) independently Call provider office for new concerns or questions  Work with the social worker to address care coordination needs and will continue to work with the clinical team to address health care and disease management related needs  Follow Up Plan:  The patient has been provided with contact information for the care management team and has been advised to call with any health related questions or concerns.   Follow  up telephone appointments with [facility] Alyse Low are on Tuesdays @ 2:30 pm x 4, next appt is scheduled for 11/19 @230pm .         Patient verbalizes understanding of instructions and care plan provided today and agrees to view in MyChart. Active MyChart status and patient understanding of how to access instructions and care plan via MyChart confirmed with patient.     The patient has been provided with contact information for the care management team and has been advised to call with any health related questions or concerns.   Please call the care guide team at [phone] if you need to cancel or reschedule your appointment.   Please call 1-800-273-TALK (toll free, 24 hour hotline) if you are experiencing a Mental Health or Behavioral Health Crisis or need someone to talk to.  Alyse Low, RN, BA, [facility], CRRN [facility] Coordinator, Transition of Care Ph # [phone]

## 2023-05-13 NOTE — Transitions of Care (Post Inpatient/ED Visit) (Signed)
05/13/2023  Name: Anthony Nelson MRN: [id number] DOB: 08-Jan-1961  Today's TOC FU Call Status: Today's TOC FU Call Status:: Successful TOC FU Call Completed TOC FU Call Complete Date: 05/13/23 Patient's Name and Date of Birth confirmed.  Transition Care Management Follow-up Telephone Call Date of Discharge: 05/10/23 Discharge Facility: [facility]) Type of Discharge: Inpatient Admission Primary Inpatient Discharge Diagnosis:: COPD exacerbation How have you been since you were released from the hospital?: Better Any questions or concerns?: No  Items Reviewed: Did you receive and understand the discharge instructions provided?: Yes Medications obtained,verified, and reconciled?: Yes (Medications Reviewed) Any new allergies since your discharge?: No Dietary orders reviewed?: NA Do you have support at home?: Yes (States has supportive family but that he cannot & does not want to live with them. Is currently homeless but is working on finding stable housing - has contracted with local Motel to stay 1 month meanwhile) People in Home: alone  Medications Reviewed Today: Medications Reviewed Today     Reviewed by Marcos Eke, RN (Registered Nurse) on 05/13/23 at 1607  Med List Status: <None>   Medication Order Taking? Sig Documenting Provider Last Dose Status Informant  albuterol (PROVENTIL) (2.5 MG/3ML) 0.083% nebulizer solution [id number] No USE 3 MLS BY NEBULIZATION EVERY 6 HOURS AS NEEDED FOR WHEEZING OR SHORTNESS OF BREATH. Storm Frisk, MD Past Week Active Self  albuterol (VENTOLIN HFA) 108 (90 Base) MCG/ACT inhaler [id number] No Inhale 2 puffs into the lungs every 6 hours as needed for wheezing or shortness of breath. Storm Frisk, MD Past Week Active Self  aspirin EC 81 MG tablet [id number]  Take 1 tablet (81 mg total) by mouth daily. Swallow whole. Briant Cedar, MD  Active   Cholecalciferol (VITAMIN D3) 50 MCG (2000 UT) TABS [id number] No Take 2,000 Units by  mouth daily. Storm Frisk, MD 04/27/2023 Active Self  docusate sodium (COLACE) 100 MG capsule [id number]  Take 1 capsule (100 mg total) by mouth 2 (two) times daily. Briant Cedar, MD  Active   famotidine (PEPCID) 20 MG tablet [id number]  Take 1 tablet (20 mg total) by mouth 2 (two) times daily. Briant Cedar, MD  Active   Fluticasone-Umeclidin-[facility] ELLIPTA) 200-62.5-25 MCG/ACT AEPB [id number]  Inhale 1 puff into the lungs daily. Briant Cedar, MD  Active   furosemide (LASIX) 20 MG tablet [id number] No Take 1 tablet (20 mg total) by mouth daily as needed.  Patient taking differently: Take 20 mg by mouth daily as needed for fluid.   Storm Frisk, MD 04/28/2023 Expired 04/28/23 2359 Self           Med Note Gwenyth Ober Apr 28, 2023  6:06 PM)    GINSENG PO [id number] No Take 1 capsule by mouth daily. [provider] 04/28/2023 Active Self           Med Note Antony Madura, Dyane Dustman Apr 28, 2023  6:42 PM) Strength not noted by the patient  guaiFENesin (MUCINEX) 600 MG 12 hr tablet [id number]  Take 2 tablets (1,200 mg total) by mouth 2 (two) times daily for 10 days. Briant Cedar, MD  Active   Multiple Vitamins-Minerals (MULTIVITAMIN WITH MINERALS) tablet [id number] No Take 1 tablet by mouth daily with breakfast. [provider] 04/28/2023 am Active Self  OXYGEN [id number] No Inhale 2 L/min into the lungs continuous.  [provider] Continuous Active Multiple Informants  predniSONE (DELTASONE) 10 MG tablet  [id number]  Take 1 tablet (10 mg total) by mouth daily for 3 days. Briant Cedar, MD  Active   rosuvastatin (CRESTOR) 40 MG tablet [id number] No Take 1 tablet (40 mg) by mouth daily. Storm Frisk, MD 04/28/2023 Active Self  sodium chloride (OCEAN) 0.65 % SOLN nasal spray [id number] No Place 2 sprays into both nostrils in the morning, at noon, and at bedtime.  Patient taking differently: Place 2 sprays into both nostrils  3 (three) times daily as needed for congestion.   Storm Frisk, MD Past Week Active Self  VITAMIN E PO [id number] No Take 1 capsule by mouth daily. [provider] 04/28/2023 Active Self           Med Note Antony Madura, Dyane Dustman Apr 28, 2023  6:41 PM) Strength not noted by the patient            Home Care and Equipment/Supplies: Were Home Health Services Ordered?: No Any new equipment or medical supplies ordered?: No  Functional Questionnaire: Do you need assistance with bathing/showering or dressing?: No Do you need assistance with meal preparation?: No Do you need assistance with eating?: No Do you have difficulty maintaining continence: No Do you need assistance with getting out of bed/getting out of a chair/moving?: No Do you have difficulty managing or taking your medications?: No  Follow up appointments reviewed: PCP Follow-up appointment confirmed?: Yes Date of PCP follow-up appointment?: 05/13/23 Follow-up Provider: Bertram Denver @2 ;30pm Specialist [facility] Follow-up appointment confirmed?: Yes Date of Specialist follow-up appointment?: 05/16/23 Follow-Up Specialty Provider:: Pulmonogist, Praveen Mannam Do you need transportation to your follow-up appointment?: No Do you understand care options if your condition(s) worsen?: Yes-patient verbalized understanding    Alyse Low, RN, BA, [facility], CRRN [facility] Population Health Care Management Coordinator, Transition of Care Ph # [phone]

## 2023-05-13 NOTE — Progress Notes (Signed)
  Care Coordination   Note   05/13/2023 Name: Anthony Nelson MRN: [id number] DOB: [birth year]  Anthony Nelson is a 62 y.o. year old male who sees Storm Frisk, MD for primary care. I reached out to Corlis Leak by phone today to offer care coordination services.  Mr. Fitzmorris was given information about Care Coordination services today including:   The Care Coordination services include support from the care team which includes your Nurse Coordinator, Clinical Social Worker, or Pharmacist.  The Care Coordination team is here to help remove barriers to the health concerns and goals most important to you. Care Coordination services are voluntary, and the patient may decline or stop services at any time by request to their care team member.   Care Coordination Consent Status: Patient agreed to services and verbal consent obtained.   Follow up plan:  Telephone appointment with care coordination team member scheduled for:  05/17/23  Encounter Outcome:  Patient Scheduled  [facility] Coordination Care Guide  Direct Dial: [phone]

## 2023-05-15 ENCOUNTER — Other Ambulatory Visit (HOSPITAL_COMMUNITY): Payer: Self-pay

## 2023-05-15 ENCOUNTER — Other Ambulatory Visit: Payer: Self-pay

## 2023-05-16 ENCOUNTER — Encounter: Payer: Self-pay | Admitting: Pulmonary Disease

## 2023-05-16 ENCOUNTER — Inpatient Hospital Stay: Payer: No Typology Code available for payment source | Admitting: Pulmonary Disease

## 2023-05-17 ENCOUNTER — Ambulatory Visit: Payer: Self-pay

## 2023-05-17 NOTE — Patient Outreach (Signed)
  Care Coordination   05/17/2023 Name: ZACKARIE MASSARI MRN: [id number] DOB: 07-Jan-1961   Care Coordination Outreach Attempts:  An unsuccessful telephone outreach was attempted for a scheduled appointment today.  Follow Up Plan:  Additional outreach attempts will be made to offer the patient care coordination information and services.   Encounter Outcome:  No Answer   Care Coordination Interventions:  No, not indicated    Lysle Morales, BSW Social Worker [facility] Care Management  [phone]

## 2023-05-21 ENCOUNTER — Telehealth: Payer: Self-pay

## 2023-05-21 ENCOUNTER — Other Ambulatory Visit: Payer: Self-pay

## 2023-05-21 NOTE — Patient Outreach (Signed)
  Care Management  Transitions of Care Program Managed Medicaid Transitions of Care week 2  05/21/2023 Name: Anthony Nelson MRN: [id number] DOB: Apr 07, 1961  Subjective: Anthony Nelson is a 62 y.o. year old male who is a primary care patient of Storm Frisk, MD. The Care Management team was unable to reach the patient by phone to assess and address transitions of care needs.   Plan: Additional outreach attempts will be made to reach the patient enrolled in the [facility] Program (Post Inpatient/ED Visit).  Alyse Low, RN, BA, [facility], CRRN [facility] Coordinator, Transition of Care Ph # [phone]

## 2023-05-22 ENCOUNTER — Telehealth: Payer: Self-pay | Admitting: *Deleted

## 2023-05-22 NOTE — Progress Notes (Signed)
  Care Coordination Note  05/22/2023 Name: Anthony Nelson MRN: [id number] DOB: 10/08/1960  Anthony Nelson is a 62 y.o. year old male who is a primary care patient of Storm Frisk, MD and is actively engaged with the care management team. I reached out to Corlis Leak by phone today to assist with re-scheduling an initial visit with the BSW  Follow up plan: Unsuccessful telephone outreach attempt made. A HIPAA compliant phone message was left for the patient providing contact information and requesting a return call.   [facility]  Care Coordination Care Guide  Direct Dial: [phone]

## 2023-05-28 ENCOUNTER — Other Ambulatory Visit: Payer: Self-pay

## 2023-05-28 ENCOUNTER — Telehealth: Payer: Self-pay

## 2023-05-28 NOTE — Patient Outreach (Signed)
  Care Management  Transitions of Care Program Managed Medicaid Transitions of Care week 2+  05/28/2023 Name: ADHRITH WIERMAN MRN: [id number] DOB: Nov 28, 1960  Subjective: Anthony Nelson is a 62 y.o. year old male who is a primary care patient of Storm Frisk, MD. The Care Management team was unable to reach the patient by phone to assess and address transitions of care needs.   Plan: Additional outreach attempts will be made to reach the patient enrolled in the [facility] Program (Post Inpatient/ED Visit).  Alyse Low, RN, BA, [facility], CRRN [facility] Coordinator, Transition of Care Ph # [phone]

## 2023-06-03 ENCOUNTER — Ambulatory Visit: Payer: Self-pay

## 2023-06-03 NOTE — Patient Outreach (Signed)
  Care Coordination   06/03/2023 Name: Anthony Nelson MRN: [id number] DOB: 1961/01/07   Care Coordination Outreach Attempts:  An unsuccessful telephone outreach was attempted for a scheduled appointment today.  Follow Up Plan:  No further outreach attempts will be made at this time. We have been unable to contact the patient to offer or enroll patient in care coordination services  Encounter Outcome:  No Answer   Care Coordination Interventions:  No, not indicated    Lysle Morales, BSW Social Worker [phone]

## 2023-06-03 NOTE — Progress Notes (Signed)
Rescheduled 12/2.

## 2023-06-04 ENCOUNTER — Other Ambulatory Visit: Payer: Self-pay

## 2023-06-04 ENCOUNTER — Telehealth: Payer: Self-pay

## 2023-06-04 NOTE — Patient Outreach (Signed)
  Care Management  Transitions of Care Program Managed Medicaid Transitions of Care week 3  06/04/2023 Name: THAYNE OKIN MRN: [id number] DOB: 1961/06/04  Subjective: Anthony Nelson is a 62 y.o. year old male who is a primary care patient of Storm Frisk, MD. The Care Management team was unable to reach the patient by phone to assess and address transitions of care needs.   Plan: Additional outreach attempts will be made to reach the patient enrolled in the [facility] Program (Post Inpatient/ED Visit).  Alyse Low, RN, BA, [facility], CRRN [facility] Coordinator, Transition of Care Ph # [phone]

## 2023-06-05 ENCOUNTER — Other Ambulatory Visit: Payer: Self-pay

## 2023-06-05 ENCOUNTER — Other Ambulatory Visit (HOSPITAL_BASED_OUTPATIENT_CLINIC_OR_DEPARTMENT_OTHER): Payer: Self-pay

## 2023-06-05 ENCOUNTER — Other Ambulatory Visit: Payer: Self-pay | Admitting: Critical Care Medicine

## 2023-06-05 ENCOUNTER — Telehealth: Payer: Self-pay

## 2023-06-05 ENCOUNTER — Other Ambulatory Visit (HOSPITAL_COMMUNITY): Payer: Self-pay

## 2023-06-05 MED ORDER — TRELEGY ELLIPTA 200-62.5-25 MCG/ACT IN AEPB
1.0000 | INHALATION_SPRAY | Freq: Every day | RESPIRATORY_TRACT | 1 refills | Status: DC
  Filled 2023-06-05 – 2023-06-28 (×4): qty 60, 30d supply, fill #0

## 2023-06-05 NOTE — Patient Outreach (Signed)
Care Management  Transitions of Care Program Managed Medicaid Transitions of Care week 2   06/05/2023 Name: EBENEZER BRUINSMA MRN: [id number] DOB: May 07, 1961  Subjective: CULLEN STEFF is a 62 y.o. year old male who is a primary care patient of Storm Frisk, MD. The Care Management team Engaged with patient Engaged with patient by telephone to assess and address transitions of care needs.   Consent to Services:  Patient was given information about Managed Medicaid Care Management services, agreed to services, and gave verbal consent to participate.   Assessment:           SDOH Interventions    Flowsheet Row ED to Hosp-Admission (Discharged) from 04/28/2023 in [facility] Telephone from 04/16/2023 in [facility] Care Coordination Clinical Support from 12/26/2022 in [facility] Of [place]. [facility] Office Visit from 10/04/2020 in [facility] [place] [facility] Of Eligha [facility]. [facility]  SDOH Interventions      Food Insecurity Interventions -- -- Intervention Not Indicated --  Housing Interventions -- Other (Comment)  [staying in boarding home-declined need for resources-should be moving within a few wks] Other (Comment)  [pt getting asst w/[facility] for voucher with [facility]] --  Transportation Interventions Inpatient TOC Other (Comment)  [pt declined-states he uses [employer] andi s reimbursed by insurance, states case worker getting [facility] transportation services arranged for him as well] SCAT Psychologist, clinical) --  Utilities Interventions -- -- Intervention Not Indicated --  Alcohol Usage Interventions -- -- Intervention Not Indicated (Score <7) --  Depression Interventions/Treatment  -- -- -- Counseling  Physical Activity Interventions -- -- Intervention Not Indicated --  Stress Interventions -- -- Intervention Not Indicated --  Social Connections  Interventions -- -- Intervention Not Indicated --        Goals Addressed             This Visit's Progress    Transition of Care       Current Barriers:  Care Coordination needs related to Limited social support, Housing barriers, and Lacks knowledge of community resource: Referral made to SW Chronic Disease Management support and education needs related to COPD   RNCM Clinical Goal(s):  Patient will work with the Care Management team over the next 30 days to address Transition of Care Barriers: Medication access Medication Management Diet/Nutrition/Food Resources Provider appointments Equipment/DME Transportation continue to work with Medical illustrator to address care management and care coordination needs related to  COPD as evidenced by adherence to CM Team Scheduled appointments work with Child psychotherapist to address  related to the management of Financial constraints related to lack of housing stability (12/4 UPDATE currently has 1 month contract with local motel, [employer], with option for additional month), Limited social support, Transportation, Limited access to food, and Housing barriers related to the management of COPD as evidenced by review of EMR and patient or social worker report not experience hospital admission as evidenced by review of EMR. Hospital Admissions in last 6 months = 2 demonstrate a decrease in COPD exacerbations as evidenced by decreased hospital visits, medication adherence, avoidance of triggers, and plan of care adherence including use of 02 @2L  continuous via nasal cannula experience decrease in ED visits as evidenced by EMR review.  ED visits in in last 6 months = 2  through collaboration with RN Care manager, provider, and care team.  Interventions: Evaluation of current treatment plan related to  self management and patient's adherence to plan as established by provider   COPD Interventions:  (Status:  Ongoing) Short Term Goal Provided  patient with basic written and verbal COPD education on self care/management/and exacerbation prevention Advised patient to track and manage COPD triggers Provided written and verbal instructions on pursed lip breathing and utilized returned demonstration as teach back Provided instruction about proper use of medications used for management of COPD including inhalers Advised patient to engage in light exercise as tolerated 3-5 days a week to aid in the the management of COPD Provided education about and advised patient to utilize infection prevention strategies to reduce risk of respiratory infection Discussed the importance of adequate rest and management of fatigue with COPD Assessed social determinant of health barriers  Patient Goals/Self-Care Activities: Participate in Transition of Care Program/Attend TOC scheduled calls Take all medications as prescribed Attend all scheduled provider appointments Call pharmacy for medication refills 3-7 days in advance of running out of medications Perform all self care activities independently  Perform IADL's (shopping, preparing meals, housekeeping, managing finances) independently Call provider office for new concerns or questions  Work with the social worker to address care coordination needs and will continue to work with the clinical team to address health care and disease management related needs  Follow Up Plan:  The patient has been provided with contact information for the care management team and has been advised to call with any health related questions or concerns.   Follow-up telephone appointment with RNCM Alyse Low is scheduled for 12/19 @200pm .        Plan: The patient has been provided with contact information for the care management team and has been advised to call with any health related questions or concerns.   Alyse Low, RN, BA, [facility], CRRN [facility] Coordinator, Transition of  Care Ph # [phone]

## 2023-06-05 NOTE — Patient Instructions (Addendum)
Visit Information  Thank you for taking time to visit with me today. Please don't hesitate to contact me if I can be of assistance to you before our next scheduled telephone appointment.  Our next 2 appointments  by telephone are on 12/12 and 06/20/23 at 2pm  Following is a copy of your care plan:   Goals Addressed             This Visit's Progress    Transition of Care       Current Barriers:  Care Coordination needs related to Limited social support, Housing barriers, and Lacks knowledge of community resource: Referral made to SW Chronic Disease Management support and education needs related to COPD   RNCM Clinical Goal(s):  Patient will work with the Care Management team over the next 30 days to address Transition of Care Barriers: Medication access Medication Management Diet/Nutrition/Food Resources Provider appointments Equipment/DME Transportation continue to work with Medical illustrator to address care management and care coordination needs related to  COPD as evidenced by adherence to CM Team Scheduled appointments work with Child psychotherapist to address  related to the management of Financial constraints related to lack of housing stability (12/4 UPDATE currently has 1 month contract with local motel, [employer], with option for additional month), Limited social support, Transportation, Limited access to food, and Housing barriers related to the management of COPD as evidenced by review of EMR and patient or social worker report not experience hospital admission as evidenced by review of EMR. Hospital Admissions in last 6 months = 2 demonstrate a decrease in COPD exacerbations as evidenced by decreased hospital visits, medication adherence, avoidance of triggers, and plan of care adherence including use of 02 @2L  continuous via nasal cannula experience decrease in ED visits as evidenced by EMR review.  ED visits in in last 6 months = 2  through collaboration with RN Care  manager, provider, and care team.   Interventions: Evaluation of current treatment plan related to  self management and patient's adherence to plan as established by provider   COPD Interventions:  (Status:  Ongoing) Short Term Goal Provided patient with basic written and verbal COPD education on self care/management/and exacerbation prevention Advised patient to track and manage COPD triggers Provided written and verbal instructions on pursed lip breathing and utilized returned demonstration as teach back Provided instruction about proper use of medications used for management of COPD including inhalers Advised patient to engage in light exercise as tolerated 3-5 days a week to aid in the the management of COPD Provided education about and advised patient to utilize infection prevention strategies to reduce risk of respiratory infection Discussed the importance of adequate rest and management of fatigue with COPD Assessed social determinant of health barriers  Patient Goals/Self-Care Activities: Participate in Transition of Care Program/Attend TOC scheduled calls Take all medications as prescribed Attend all scheduled provider appointments Call pharmacy for medication refills 3-7 days in advance of running out of medications Perform all self care activities independently  Perform IADL's (shopping, preparing meals, housekeeping, managing finances) independently Call provider office for new concerns or questions  Work with the social worker to address care coordination needs and will continue to work with the clinical team to address health care and disease management related needs  Follow Up Plan:  The patient has been provided with contact information for the care management team and has been advised to call with any health related questions or concerns.   Follow-up telephone appointment with  RNCM Alyse Low is scheduled for 12/19 @200pm .        Patient verbalizes  understanding of instructions and care plan provided today and agrees to view in MyChart. Active MyChart status and patient understanding of how to access instructions and care plan via MyChart confirmed with patient.     The patient has been provided with contact information for the care management team and has been advised to call with any health related questions or concerns.   Please call the care guide team at [phone] if you need to cancel or reschedule your appointment.   Please call 1-800-273-TALK (toll free, 24 hour hotline) if you are experiencing a Mental Health or Behavioral Health Crisis or need someone to talk to.  Alyse Low, RN, BA, [facility], CRRN [facility] Coordinator, Transition of Care Ph # [phone]

## 2023-06-06 ENCOUNTER — Other Ambulatory Visit (HOSPITAL_COMMUNITY): Payer: Self-pay

## 2023-06-11 ENCOUNTER — Telehealth: Payer: Self-pay | Admitting: *Deleted

## 2023-06-11 NOTE — Progress Notes (Signed)
  Care Coordination Note  06/11/2023 Name: Anthony Nelson MRN: [id number] DOB: 1960/07/25  Anthony Nelson is a 62 y.o. year old male who is a primary care patient of Storm Frisk, MD and is actively engaged with the care management team. I reached out to Corlis Leak by phone today to assist with re-scheduling an initial visit with the BSW  Follow up plan: Unsuccessful telephone outreach attempt made. A HIPAA compliant phone message was left for the patient providing contact information and requesting a return call.   [facility]  Care Coordination Care Guide  Direct Dial: [phone]

## 2023-06-13 ENCOUNTER — Other Ambulatory Visit: Payer: Self-pay

## 2023-06-13 ENCOUNTER — Telehealth: Payer: Self-pay

## 2023-06-13 NOTE — Patient Outreach (Signed)
  Care Management  Transitions of Care Program Managed Medicaid Transitions of Care week 3  06/13/2023 Name: Anthony Nelson MRN: [id number] DOB: October 02, 1960  Subjective: Anthony Nelson is a 62 y.o. year old male who is a primary care patient of Storm Frisk, MD. The Care Management team was unable to reach the patient by phone to assess and address transitions of care needs.   Plan: Additional outreach attempts will be made to reach the patient enrolled in the [facility] Program (Post Inpatient/ED Visit).  Alyse Low, RN, BA, [facility], CRRN [facility] Coordinator, Transition of Care Ph # [phone]

## 2023-06-17 NOTE — Progress Notes (Signed)
  Care Coordination Note  06/17/2023 Name: [name] MRN: [id number] DOB: 25-Apr-1961  Anthony Nelson is a 62 y.o. year old male who is a primary care patient of Storm Frisk, MD and is actively engaged with the care management team. I reached out to Corlis Leak by phone today to assist with re-scheduling a follow up visit with the BSW  Follow up plan: Unsuccessful telephone outreach attempt made. A HIPAA compliant phone message was left for the patient providing contact information and requesting a return call.  We have been unable to make contact with the patient for follow up. The care management team is available to follow up with the patient after provider conversation with the patient regarding recommendation for care management engagement and subsequent re-referral to the care management team.  [facility] Coordination Care Guide  Direct Dial: [phone]

## 2023-06-20 ENCOUNTER — Other Ambulatory Visit: Payer: Self-pay

## 2023-06-20 ENCOUNTER — Telehealth: Payer: Self-pay

## 2023-06-20 DIAGNOSIS — J441 Chronic obstructive pulmonary disease with (acute) exacerbation: Secondary | ICD-10-CM

## 2023-06-20 NOTE — Patient Outreach (Signed)
  Care Management  Transitions of Care Program Managed Medicaid Transitions of Care week 4  06/20/2023 Name: Anthony Nelson MRN: [id number] DOB: 1960-10-29  Subjective: Anthony Nelson is a 62 y.o. year old male who is a primary care patient of Storm Frisk, MD. The Care Management team was unable to reach the patient by phone to assess and address transitions of care needs.   Plan: No further outreach attempts will be made at this time.  We have been unable to reach the patient.  Sig

## 2023-06-21 ENCOUNTER — Telehealth: Payer: Self-pay | Admitting: *Deleted

## 2023-06-21 NOTE — Progress Notes (Unsigned)
Complex Care Management  Outreach Note  06/21/2023 Name: FARAH SCHIRTZINGER MRN: [id number] DOB: 09-10-1960   Complex Care Management Outreach Attempts: An unsuccessful telephone outreach was attempted today to offer the patient information about available complex care management services.  Follow Up Plan:  Additional outreach attempts will be made to offer the patient complex care management information and services.   Encounter Outcome:  No Answer  Burman Nieves, CCMA Care Coordination Care Guide Direct Dial: [phone]

## 2023-06-24 NOTE — Progress Notes (Unsigned)
Complex Care Management Note Care Guide Note  06/24/2023 Name: MASEN WESTMEYER MRN: [id number] DOB: 19-Nov-1960   Complex Care Management Outreach Attempts: A second unsuccessful outreach was attempted today to offer the patient with information about available complex care management services.  Follow Up Plan:  Additional outreach attempts will be made to offer the patient complex care management information and services.   Encounter Outcome:  No Answer  Burman Nieves, CCMA Care Coordination Care Guide Direct Dial: [phone]

## 2023-06-25 NOTE — Progress Notes (Signed)
Complex Care Management Note  Care Guide Note 06/25/2023 Name: Anthony Nelson MRN: [id number] DOB: [birth year]  Anthony Nelson is a 62 y.o. year old male who sees Storm Frisk, MD for primary care. I reached out to Corlis Leak by phone today to offer complex care management services.  [name] was given information about Complex Care Management services today including:   The Complex Care Management services include support from the care team which includes your Nurse Coordinator, Clinical Social Worker, or Pharmacist.  The Complex Care Management team is here to help remove barriers to the health concerns and goals most important to you. Complex Care Management services are voluntary, and the patient may decline or stop services at any time by request to their care team member.   Complex Care Management Consent Status: Patient agreed to services and verbal consent obtained.   Follow up plan:  Telephone appointment with complex care management team member scheduled for:  07/16/2023  Encounter Outcome:  Patient Scheduled  Burman Nieves, [facility] Care Coordination Care Guide Direct Dial: [phone]

## 2023-06-27 ENCOUNTER — Other Ambulatory Visit: Payer: Self-pay | Admitting: Critical Care Medicine

## 2023-06-27 ENCOUNTER — Other Ambulatory Visit: Payer: Self-pay

## 2023-06-27 ENCOUNTER — Other Ambulatory Visit (HOSPITAL_COMMUNITY): Payer: Self-pay

## 2023-06-27 MED ORDER — ALBUTEROL SULFATE (2.5 MG/3ML) 0.083% IN NEBU
3.0000 mL | INHALATION_SOLUTION | Freq: Four times a day (QID) | RESPIRATORY_TRACT | 0 refills | Status: DC | PRN
  Filled 2023-06-27: qty 90, 8d supply, fill #0

## 2023-07-07 ENCOUNTER — Emergency Department (HOSPITAL_COMMUNITY): Payer: No Typology Code available for payment source

## 2023-07-07 ENCOUNTER — Inpatient Hospital Stay (HOSPITAL_COMMUNITY): Payer: No Typology Code available for payment source

## 2023-07-07 ENCOUNTER — Inpatient Hospital Stay (HOSPITAL_COMMUNITY)
Admission: EM | Admit: 2023-07-07 | Discharge: 2023-07-12 | DRG: 208 | Disposition: A | Discharge status: Home / Self Care | Payer: No Typology Code available for payment source | Attending: Internal Medicine | Admitting: Internal Medicine

## 2023-07-07 DIAGNOSIS — J439 Emphysema, unspecified: Secondary | ICD-10-CM | POA: Diagnosis present

## 2023-07-07 DIAGNOSIS — J9691 Respiratory failure, unspecified with hypoxia: Secondary | ICD-10-CM | POA: Diagnosis present

## 2023-07-07 DIAGNOSIS — Z7982 Long term (current) use of aspirin: Secondary | ICD-10-CM

## 2023-07-07 DIAGNOSIS — Z681 Body mass index (BMI) 19 or less, adult: Secondary | ICD-10-CM | POA: Diagnosis not present

## 2023-07-07 DIAGNOSIS — R404 Transient alteration of awareness: Secondary | ICD-10-CM | POA: Diagnosis not present

## 2023-07-07 DIAGNOSIS — J441 Chronic obstructive pulmonary disease with (acute) exacerbation: Secondary | ICD-10-CM | POA: Diagnosis present

## 2023-07-07 DIAGNOSIS — J9601 Acute respiratory failure with hypoxia: Secondary | ICD-10-CM | POA: Diagnosis not present

## 2023-07-07 DIAGNOSIS — I2723 Pulmonary hypertension due to lung diseases and hypoxia: Secondary | ICD-10-CM | POA: Diagnosis present

## 2023-07-07 DIAGNOSIS — R64 Cachexia: Secondary | ICD-10-CM | POA: Diagnosis present

## 2023-07-07 DIAGNOSIS — Z881 Allergy status to other antibiotic agents status: Secondary | ICD-10-CM | POA: Diagnosis not present

## 2023-07-07 DIAGNOSIS — K219 Gastro-esophageal reflux disease without esophagitis: Secondary | ICD-10-CM | POA: Diagnosis present

## 2023-07-07 DIAGNOSIS — I5032 Chronic diastolic (congestive) heart failure: Secondary | ICD-10-CM | POA: Diagnosis present

## 2023-07-07 DIAGNOSIS — F419 Anxiety disorder, unspecified: Secondary | ICD-10-CM | POA: Diagnosis present

## 2023-07-07 DIAGNOSIS — Z87891 Personal history of nicotine dependence: Secondary | ICD-10-CM | POA: Diagnosis not present

## 2023-07-07 DIAGNOSIS — Z1152 Encounter for screening for COVID-19: Secondary | ICD-10-CM | POA: Diagnosis not present

## 2023-07-07 DIAGNOSIS — Z79899 Other long term (current) drug therapy: Secondary | ICD-10-CM

## 2023-07-07 DIAGNOSIS — J9622 Acute and chronic respiratory failure with hypercapnia: Secondary | ICD-10-CM | POA: Diagnosis present

## 2023-07-07 DIAGNOSIS — J9621 Acute and chronic respiratory failure with hypoxia: Secondary | ICD-10-CM | POA: Diagnosis not present

## 2023-07-07 DIAGNOSIS — G9341 Metabolic encephalopathy: Secondary | ICD-10-CM | POA: Diagnosis not present

## 2023-07-07 DIAGNOSIS — R0603 Acute respiratory distress: Principal | ICD-10-CM

## 2023-07-07 DIAGNOSIS — E873 Alkalosis: Secondary | ICD-10-CM | POA: Diagnosis not present

## 2023-07-07 DIAGNOSIS — R4182 Altered mental status, unspecified: Secondary | ICD-10-CM

## 2023-07-07 DIAGNOSIS — R231 Pallor: Secondary | ICD-10-CM | POA: Diagnosis not present

## 2023-07-07 DIAGNOSIS — E43 Unspecified severe protein-calorie malnutrition: Secondary | ICD-10-CM | POA: Diagnosis present

## 2023-07-07 DIAGNOSIS — Z5900 Homelessness unspecified: Secondary | ICD-10-CM | POA: Diagnosis not present

## 2023-07-07 DIAGNOSIS — J449 Chronic obstructive pulmonary disease, unspecified: Secondary | ICD-10-CM | POA: Diagnosis not present

## 2023-07-07 DIAGNOSIS — J9612 Chronic respiratory failure with hypercapnia: Secondary | ICD-10-CM | POA: Diagnosis not present

## 2023-07-07 DIAGNOSIS — R000 Tachycardia, unspecified: Secondary | ICD-10-CM | POA: Diagnosis not present

## 2023-07-07 DIAGNOSIS — J431 Panlobular emphysema: Secondary | ICD-10-CM | POA: Diagnosis not present

## 2023-07-07 DIAGNOSIS — D72829 Elevated white blood cell count, unspecified: Secondary | ICD-10-CM | POA: Diagnosis present

## 2023-07-07 DIAGNOSIS — R0689 Other abnormalities of breathing: Secondary | ICD-10-CM | POA: Diagnosis not present

## 2023-07-07 DIAGNOSIS — R4781 Slurred speech: Secondary | ICD-10-CM | POA: Diagnosis not present

## 2023-07-07 LAB — URINALYSIS, ROUTINE W REFLEX MICROSCOPIC
Bacteria, UA: NONE SEEN
Bilirubin Urine: NEGATIVE
Glucose, UA: NEGATIVE mg/dL
Ketones, ur: 80 mg/dL — AB
Leukocytes,Ua: NEGATIVE
Nitrite: NEGATIVE
Protein, ur: 100 mg/dL — AB
Specific Gravity, Urine: 1.016 (ref 1.005–1.030)
pH: 6 (ref 5.0–8.0)

## 2023-07-07 LAB — DIFFERENTIAL
Abs Immature Granulocytes: 0.04 10*3/uL (ref 0.00–0.07)
Basophils Absolute: 0 10*3/uL (ref 0.0–0.1)
Basophils Relative: 0 %
Eosinophils Absolute: 0 10*3/uL (ref 0.0–0.5)
Eosinophils Relative: 0 %
Immature Granulocytes: 1 %
Lymphocytes Relative: 4 %
Lymphs Abs: 0.3 10*3/uL — ABNORMAL LOW (ref 0.7–4.0)
Monocytes Absolute: 0.5 10*3/uL (ref 0.1–1.0)
Monocytes Relative: 8 %
Neutro Abs: 6.1 10*3/uL (ref 1.7–7.7)
Neutrophils Relative %: 87 %

## 2023-07-07 LAB — I-STAT ARTERIAL BLOOD GAS, ED
Acid-Base Excess: 29 mmol/L — ABNORMAL HIGH (ref 0.0–2.0)
Bicarbonate: 53.9 mmol/L — ABNORMAL HIGH (ref 20.0–28.0)
Calcium, Ion: 1 mmol/L — ABNORMAL LOW (ref 1.15–1.40)
HCT: 41 % (ref 39.0–52.0)
Hemoglobin: 13.9 g/dL (ref 13.0–17.0)
O2 Saturation: 100 %
Potassium: 3.8 mmol/L (ref 3.5–5.1)
Sodium: 134 mmol/L — ABNORMAL LOW (ref 135–145)
TCO2: >50 mmol/L — ABNORMAL HIGH (ref 22–32)
pCO2 arterial: 48.9 mm[Hg] — ABNORMAL HIGH (ref 32–48)
pH, Arterial: 7.65 (ref 7.35–7.45)
pO2, Arterial: 523 mm[Hg] — ABNORMAL HIGH (ref 83–108)

## 2023-07-07 LAB — I-STAT VENOUS BLOOD GAS, ED
Acid-Base Excess: 28 mmol/L — ABNORMAL HIGH (ref 0.0–2.0)
Bicarbonate: 61.1 mmol/L — ABNORMAL HIGH (ref 20.0–28.0)
Calcium, Ion: 1.01 mmol/L — ABNORMAL LOW (ref 1.15–1.40)
HCT: 46 % (ref 39.0–52.0)
Hemoglobin: 15.6 g/dL (ref 13.0–17.0)
O2 Saturation: 45 %
Potassium: 4.4 mmol/L (ref 3.5–5.1)
Sodium: 135 mmol/L (ref 135–145)
TCO2: >50 mmol/L — ABNORMAL HIGH (ref 22–32)
pCO2, Ven: 100.5 mm[Hg] (ref 44–60)
pH, Ven: 7.392 (ref 7.25–7.43)
pO2, Ven: 28 mm[Hg] — CL (ref 32–45)

## 2023-07-07 LAB — POCT I-STAT 7, (LYTES, BLD GAS, ICA,H+H)
Acid-Base Excess: 23 mmol/L — ABNORMAL HIGH (ref 0.0–2.0)
Bicarbonate: 48.3 mmol/L — ABNORMAL HIGH (ref 20.0–28.0)
Calcium, Ion: 1.07 mmol/L — ABNORMAL LOW (ref 1.15–1.40)
HCT: 37 % — ABNORMAL LOW (ref 39.0–52.0)
Hemoglobin: 12.6 g/dL — ABNORMAL LOW (ref 13.0–17.0)
O2 Saturation: 94 %
Patient temperature: 98.6
Potassium: 3.9 mmol/L (ref 3.5–5.1)
Sodium: 135 mmol/L (ref 135–145)
TCO2: 50 mmol/L — ABNORMAL HIGH (ref 22–32)
pCO2 arterial: 55.1 mm[Hg] — ABNORMAL HIGH (ref 32–48)
pH, Arterial: 7.55 — ABNORMAL HIGH (ref 7.35–7.45)
pO2, Arterial: 64 mm[Hg] — ABNORMAL LOW (ref 83–108)

## 2023-07-07 LAB — COMPREHENSIVE METABOLIC PANEL
ALT: 20 U/L (ref 0–44)
AST: 30 U/L (ref 15–41)
Albumin: 3.9 g/dL (ref 3.5–5.0)
Alkaline Phosphatase: 39 U/L (ref 38–126)
BUN: 29 mg/dL — ABNORMAL HIGH (ref 8–23)
CO2: >45 mmol/L — ABNORMAL HIGH (ref 22–32)
Calcium: 8.8 mg/dL — ABNORMAL LOW (ref 8.9–10.3)
Chloride: 77 mmol/L — ABNORMAL LOW (ref 98–111)
Creatinine, Ser: 0.53 mg/dL — ABNORMAL LOW (ref 0.61–1.24)
GFR, Estimated: >60 mL/min (ref >60)
Glucose, Bld: 124 mg/dL — ABNORMAL HIGH (ref 70–99)
Potassium: 4.7 mmol/L (ref 3.5–5.1)
Sodium: 138 mmol/L (ref 135–145)
Total Bilirubin: 1.2 mg/dL (ref 0.0–1.2)
Total Protein: 7.1 g/dL (ref 6.5–8.1)

## 2023-07-07 LAB — MRSA NEXT GEN BY PCR, NASAL: MRSA by PCR Next Gen: NOT DETECTED

## 2023-07-07 LAB — RESP PANEL BY RT-PCR (RSV, FLU A&B, COVID)  RVPGX2
Influenza A by PCR: NEGATIVE
Influenza B by PCR: NEGATIVE
Resp Syncytial Virus by PCR: NEGATIVE
SARS Coronavirus 2 by RT PCR: NEGATIVE

## 2023-07-07 LAB — GLUCOSE, CAPILLARY
Glucose-Capillary: 134 mg/dL — ABNORMAL HIGH (ref 70–99)
Glucose-Capillary: 144 mg/dL — ABNORMAL HIGH (ref 70–99)
Glucose-Capillary: 114 mg/dL — ABNORMAL HIGH (ref 70–99)

## 2023-07-07 LAB — CBC
HCT: 42.9 % (ref 39.0–52.0)
Hemoglobin: 12.8 g/dL — ABNORMAL LOW (ref 13.0–17.0)
MCH: 31.2 pg (ref 26.0–34.0)
MCHC: 29.8 g/dL — ABNORMAL LOW (ref 30.0–36.0)
MCV: 104.6 fL — ABNORMAL HIGH (ref 80.0–100.0)
Platelets: 138 10*3/uL — ABNORMAL LOW (ref 150–400)
RBC: 4.1 MIL/uL — ABNORMAL LOW (ref 4.22–5.81)
RDW: 12.4 % (ref 11.5–15.5)
WBC: 7 10*3/uL (ref 4.0–10.5)
nRBC: 0 % (ref 0.0–0.2)

## 2023-07-07 LAB — RAPID URINE DRUG SCREEN, HOSP PERFORMED
Amphetamines: NOT DETECTED
Barbiturates: NOT DETECTED
Benzodiazepines: NOT DETECTED
Cocaine: NOT DETECTED
Opiates: NOT DETECTED
Tetrahydrocannabinol: NOT DETECTED

## 2023-07-07 LAB — APTT: aPTT: 33 s (ref 24–36)

## 2023-07-07 LAB — I-STAT CG4 LACTIC ACID, ED
Lactic Acid, Venous: 1.1 mmol/L (ref 0.5–1.9)
Lactic Acid, Venous: 3.2 mmol/L (ref 0.5–1.9)

## 2023-07-07 LAB — PROTIME-INR
INR: 1 (ref 0.8–1.2)
Prothrombin Time: 13.4 s (ref 11.4–15.2)

## 2023-07-07 LAB — ETHANOL: Alcohol, Ethyl (B): <10 mg/dL (ref <10)

## 2023-07-07 MED ORDER — POLYETHYLENE GLYCOL 3350 17 G PO PACK: 17.0000 g | PACK | Freq: Every day | ORAL | Status: DC | PRN

## 2023-07-07 MED ORDER — FENTANYL BOLUS VIA INFUSION
50.0000 ug | INTRAVENOUS | Status: DC | PRN
  Administered 2023-07-07 (×3): 50 ug via INTRAVENOUS
  Administered 2023-07-08 (×3): 100 ug via INTRAVENOUS
  Administered 2023-07-08: 50 ug via INTRAVENOUS
  Administered 2023-07-08 (×4): 100 ug via INTRAVENOUS
  Administered 2023-07-08: 50 ug via INTRAVENOUS
  Administered 2023-07-09: 100 ug via INTRAVENOUS

## 2023-07-07 MED ORDER — INSULIN ASPART 100 UNIT/ML IJ SOLN
0.0000 [IU] | INTRAMUSCULAR | Status: DC
  Administered 2023-07-07 – 2023-07-10 (×11): 1 [IU] via SUBCUTANEOUS

## 2023-07-07 MED ORDER — SODIUM CHLORIDE 0.9 % IV SOLN: 500.0000 mg | INTRAVENOUS | Status: DC

## 2023-07-07 MED ORDER — SODIUM CHLORIDE 0.9 % IV SOLN
INTRAVENOUS | Status: AC | PRN
Start: 2023-07-07 — End: 2023-07-08

## 2023-07-07 MED ORDER — DOCUSATE SODIUM 50 MG/5ML PO LIQD: 100.0000 mg | Freq: Two times a day (BID) | ORAL | Status: DC | PRN

## 2023-07-07 MED ORDER — METHYLPREDNISOLONE SODIUM SUCC 125 MG IJ SOLR
80.0000 mg | Freq: Two times a day (BID) | INTRAMUSCULAR | Status: DC
  Administered 2023-07-07 – 2023-07-08 (×2): 80 mg via INTRAVENOUS
  Filled 2023-07-07 (×2): qty 2

## 2023-07-07 MED ORDER — SODIUM CHLORIDE 0.9 % IV SOLN
1.0000 g | INTRAVENOUS | Status: DC
  Administered 2023-07-07 – 2023-07-11 (×5): 1 g via INTRAVENOUS
  Filled 2023-07-07 (×5): qty 10

## 2023-07-07 MED ORDER — ETOMIDATE 2 MG/ML IV SOLN
INTRAVENOUS | Status: AC | PRN
  Administered 2023-07-07: 16 mg via INTRAVENOUS

## 2023-07-07 MED ORDER — CHLORHEXIDINE GLUCONATE CLOTH 2 % EX PADS
6.0000 | MEDICATED_PAD | Freq: Every day | CUTANEOUS | Status: DC
  Administered 2023-07-07 – 2023-07-12 (×6): 6 via TOPICAL

## 2023-07-07 MED ORDER — PROPOFOL 1000 MG/100ML IV EMUL
0.0000 ug/kg/min | INTRAVENOUS | Status: DC
  Administered 2023-07-07: 20 ug/kg/min via INTRAVENOUS
  Filled 2023-07-07 (×2): qty 100

## 2023-07-07 MED ORDER — SODIUM CHLORIDE 0.9 % IV SOLN
250.0000 mL | INTRAVENOUS | Status: DC
  Administered 2023-07-07: 250 mL via INTRAVENOUS

## 2023-07-07 MED ORDER — NOREPINEPHRINE 4 MG/250ML-% IV SOLN
2.0000 ug/min | INTRAVENOUS | Status: DC
  Administered 2023-07-07 – 2023-07-08 (×3): 5 ug/min via INTRAVENOUS
  Administered 2023-07-08: 2 ug/min via INTRAVENOUS
  Filled 2023-07-07: qty 250

## 2023-07-07 MED ORDER — NOREPINEPHRINE 4 MG/250ML-% IV SOLN
INTRAVENOUS | Status: AC
  Administered 2023-07-07: 2 ug/min via INTRAVENOUS
  Filled 2023-07-07: qty 250

## 2023-07-07 MED ORDER — DOXYCYCLINE HYCLATE 100 MG IV SOLR
100.0000 mg | Freq: Two times a day (BID) | INTRAVENOUS | Status: AC
  Administered 2023-07-07 – 2023-07-12 (×10): 100 mg via INTRAVENOUS
  Filled 2023-07-07 (×10): qty 100

## 2023-07-07 MED ORDER — ENOXAPARIN SODIUM 40 MG/0.4ML IJ SOSY
40.0000 mg | PREFILLED_SYRINGE | INTRAMUSCULAR | Status: DC
  Administered 2023-07-08 – 2023-07-10 (×3): 40 mg via SUBCUTANEOUS
  Filled 2023-07-07 (×3): qty 0.4

## 2023-07-07 MED ORDER — IPRATROPIUM-ALBUTEROL 0.5-2.5 (3) MG/3ML IN SOLN
3.0000 mL | RESPIRATORY_TRACT | Status: DC | PRN
  Administered 2023-07-10: 3 mL via RESPIRATORY_TRACT
  Filled 2023-07-07: qty 3

## 2023-07-07 MED ORDER — SODIUM CHLORIDE 0.9 % IV BOLUS
1000.0000 mL | Freq: Once | INTRAVENOUS | Status: AC
  Administered 2023-07-07: 1000 mL via INTRAVENOUS

## 2023-07-07 MED ORDER — SODIUM CHLORIDE 0.9 % IV BOLUS
500.0000 mL | Freq: Once | INTRAVENOUS | Status: AC
  Administered 2023-07-07: 500 mL via INTRAVENOUS

## 2023-07-07 MED ORDER — PANTOPRAZOLE SODIUM 40 MG IV SOLR
40.0000 mg | Freq: Every day | INTRAVENOUS | Status: DC
  Administered 2023-07-07: 40 mg via INTRAVENOUS
  Filled 2023-07-07: qty 10

## 2023-07-07 MED ORDER — NOREPINEPHRINE 4 MG/250ML-% IV SOLN: 0.0000 ug/min | INTRAVENOUS | Status: DC

## 2023-07-07 MED ORDER — FAMOTIDINE 20 MG PO TABS
20.0000 mg | ORAL_TABLET | Freq: Two times a day (BID) | ORAL | Status: DC
  Administered 2023-07-07 – 2023-07-09 (×4): 20 mg
  Filled 2023-07-07 (×4): qty 1

## 2023-07-07 MED ORDER — IOHEXOL 350 MG/ML SOLN
75.0000 mL | Freq: Once | INTRAVENOUS | Status: AC | PRN
  Administered 2023-07-07: 75 mL via INTRAVENOUS

## 2023-07-07 MED ORDER — ONDANSETRON HCL 4 MG/2ML IJ SOLN: 4.0000 mg | Freq: Four times a day (QID) | INTRAMUSCULAR | Status: DC | PRN

## 2023-07-07 MED ORDER — FENTANYL 2500MCG IN NS 250ML (10MCG/ML) PREMIX INFUSION
50.0000 ug/h | INTRAVENOUS | Status: DC
  Administered 2023-07-07: 50 ug/h via INTRAVENOUS
  Administered 2023-07-08: 100 ug/h via INTRAVENOUS
  Administered 2023-07-09: 50 ug/h via INTRAVENOUS
  Filled 2023-07-07 (×3): qty 250

## 2023-07-07 MED ORDER — ROCURONIUM BROMIDE 10 MG/ML (PF) SYRINGE
PREFILLED_SYRINGE | INTRAVENOUS | Status: AC | PRN
  Administered 2023-07-07: 70 mg via INTRAVENOUS

## 2023-07-07 NOTE — ED Provider Notes (Signed)
 [place] EMERGENC[facility] DEPARTMENT AT [facility] Provider Note   CSN: [id number] Arrival date & time: 07/07/23  1418     History  Chief complaint: Altered mental status and respiratory distress.    Anthony Nelson is a 63 y.o. male.  HPI   Patient has history of bronchitis COPD respiratory failure with hypoxia pulmonary hypertension acid reflux diastolic heart failure.  Patient resides [place] a motel.  He was supposed to check out today.  Hotel management went to check on the patient and found him altered and unresponsive.  EMS noted to have respiratory difficulty.  He was given breathing treatments and started on nonrebreather.  There was some question of facial droop and patient was noncommunicative.  In the ED patient is altered does not respond to any questions  Home Medications Prior to Admission medications   Medication Sig Start Date End Date Taking? Authorizing Provider  albuterol  (PROVENTIL ) (2.5 MG/3ML) 0.083% nebulizer solution USE 3 MLS BY NEBULIZATION EVERY 6 HOURS AS NEEDED FOR WHEEZING OR SHORTNESS OF BREATH. 06/27/23   Brien Belvie BRAVO, MD  albuterol  (VENTOLIN  HFA) 108 [phone] Base) MCG/ACT inhaler Inhale 2 puffs into the lungs every 6 hours as needed for wheezing or shortness of breath. 04/02/23   Brien Belvie BRAVO, MD  aspirin  EC 81 MG tablet Take 1 tablet (81 mg total) by mouth daily. Swallow whole. 05/10/23 08/08/23  Ezenduka, Nkeiruka J, MD  Cholecalciferol  (VITAMIN D3) 50 MCG (2000 UT) TABS Take 2,000 Units by mouth daily. 09/28/21   Brien Belvie BRAVO, MD  docusate sodium  (COLACE) 100 MG capsule Take 1 capsule (100 mg total) by mouth 2 (two) times daily. 05/09/23   Ezenduka, Nkeiruka J, MD  famotidine  (PEPCID ) 20 MG tablet Take 1 tablet (20 mg total) by mouth 2 (two) times daily. 05/09/23 06/08/23  Ezenduka, Nkeiruka J, MD  Fluticasone -Umeclidin-Vilant (TRELEGY ELLIPTA ) 200-62.5-25 MCG/ACT AEPB Inhale 1 puff into the lungs daily. 06/05/23 08/04/23  Newlin, Enobong, MD   furosemide  (LASIX ) 20 MG tablet Take 1 tablet (20 mg total) by mouth daily as needed. Patient taking differently: Take 20 mg by mouth daily as needed for fluid. 09/28/21 04/28/23  Brien Belvie BRAVO, MD  GINSENG PO Take 1 capsule by mouth daily.    [provider]  Multiple Vitamins-Minerals (MULTIVITAMIN WITH MINERALS) tablet Take 1 tablet by mouth daily with breakfast.    [provider]  OXYGEN  Inhale 2 L/min into the lungs continuous.     [provider]  rosuvastatin  (CRESTOR ) 40 MG tablet Take 1 tablet (40 mg) by mouth daily. 06/05/22 08/04/23  Brien Belvie BRAVO, MD  sodium chloride  (OCEAN) 0.65 % SOLN nasal spray Place 2 sprays into both nostrils in the morning, at noon, and at bedtime. Patient taking differently: Place 2 sprays into both nostrils 3 (three) times daily as needed for congestion. 12/05/20   Brien Belvie BRAVO, MD  VITAMIN E PO Take 1 capsule by mouth daily.    [provider]      Allergies    Erythromycin    Review of Systems   Review of Systems  Physical Exam Updated Vital Signs BP 101/80   Pulse (!) 147   Resp 17   Ht 1.829 m (6')   SpO2 100%   BMI 17.64 kg/m  Physical Exam Vitals and nursing note reviewed.  Constitutional:      Appearance: He is well-developed. He is ill-appearing. He is not diaphoretic.  HENT:     Head: Normocephalic and atraumatic.  Right Ear: External ear normal.     Left Ear: External ear normal.  Eyes:     General: No scleral icterus.       Right eye: No discharge.        Left eye: No discharge.     Conjunctiva/sclera: Conjunctivae normal.  Neck:     Trachea: No tracheal deviation.  Cardiovascular:     Rate and Rhythm: Normal rate and regular rhythm.  Pulmonary:     Effort: Pulmonary effort is normal. No respiratory distress.     Breath sounds: Normal breath sounds. No stridor. No wheezing or rales.  Abdominal:     General: Bowel sounds are normal. There is no distension.     Palpations:  Abdomen is soft.     Tenderness: There is no abdominal tenderness. There is no guarding or rebound.  Musculoskeletal:        General: No tenderness or deformity.     Cervical back: Neck supple.  Skin:    General: Skin is warm and dry.     Findings: No rash.  Neurological:     General: No focal deficit present.     Mental Status: He is unresponsive.     GCS: GCS eye subscore is 2. GCS verbal subscore is 1. GCS motor subscore is 5.     Comments: Patient did move his right upper extremity, no response to verbal stimuli  Psychiatric:        Mood and Affect: Mood normal.     ED Results / Procedures / Treatments   Labs (all labs ordered are listed, but only abnormal results are displayed) Labs Reviewed  RESP PANEL BY RT-PCR (RSV, FLU A&B, COVID)  RVPGX2  RESP PANEL BY RT-PCR (RSV, FLU A&B, COVID)  RVPGX2  ETHANOL  PROTIME-INR  APTT  CBC  DIFFERENTIAL  COMPREHENSIVE METABOLIC PANEL  RAPID URINE DRUG SCREEN, HOSP PERFORMED  URINALYSIS, ROUTINE W REFLEX MICROSCOPIC  I-STAT CHEM 8, ED  I-STAT CHEM 8, ED  I-STAT CG4 LACTIC ACID, ED  I-STAT ARTERIAL BLOOD GAS, ED    EKG None  Radiology No results found.  Procedures .Critical Care  Performed by: Randol Simmonds, MD Authorized by: Randol Simmonds, MD   Critical care provider statement:    Critical care time (minutes):  30   Critical care was time spent personally by me on the following activities:  Development of treatment plan with patient or surrogate, discussions with consultants, evaluation of patient's response to treatment, examination of patient, ordering and review of laboratory studies, ordering and review of radiographic studies, ordering and performing treatments and interventions, pulse oximetry, re-evaluation of patient's condition and review of old charts Procedure Name: Intubation Date/Time: 07/07/2023 3:04 PM  Performed by: Randol Simmonds, MDPre-anesthesia Checklist: Patient identified, Patient being monitored, Timeout  performed, Emergency Drugs available and Suction available Oxygen  Delivery Method: Non-rebreather mask Preoxygenation: Pre-oxygenation with 100% oxygen  Induction Type: Rapid sequence Laryngoscope Size: Glidescope Tube size: 7.5 mm Number of attempts: 1 Airway Equipment and Method: Video-laryngoscopy Placement Confirmation: ETT inserted through vocal cords under direct vision, CO2 detector and Breath sounds checked- equal and bilateral Tube secured with: Tape Dental Injury: Teeth and Oropharynx as per pre-operative assessment         Medications Ordered in ED Medications  fentaNYL  in NS (75mcg/ml) infusion-PREMIX (50 mcg/hr Intravenous New Bag/Given 07/07/23 1448)  fentaNYL  (SUBLIMAZE ) bolus via infusion 50-100 mcg (has no administration in time range)  propofol  (DIPRIVAN ) 1000 MG/100ML infusion (20 mcg/kg/min  60 kg (  Order-Specific) Intravenous New Bag/Given 07/07/23 1446)  sodium chloride  0.9 % bolus 1,000 mL (has no administration in time range)    ED Course/ Medical Decision Making/ A&P Clinical Course as of 07/07/23 1545  Sun Jul 07, 2023  1523 Chest x-ray shows endotracheal tube is 6.3 cm above the carina.  There is questionable mediastinal at the level of the trachea.  No focal infiltrate.  Patient does have a CT scan of the chest ordered [JK]  1545 Case discussed with [place], PCCM.  Wiill see pt for admission [JK]    Clinical Course User Index [JK] Randol Simmonds, MD                                 Medical Decision Making Amount and/or Complexity of Data Reviewed Labs: ordered. Radiology: ordered.  Risk Prescription drug management.   Patient presented with altered mental status and respiratory distress.  Patient not able to provide any history in the ED.  Patient with diminished breath sounds.  With his altered state he was intubated for airway protection.  Preliminary chest x-ray suggests ET tube in the appropriate location.  Laboratory tests have been  ordered.  Will proceed with CT imaging.  IV fluid hydration ordered.  Patient remains critically ill and will require close observation.  Pt will require ICU admission.  Care turned over to Dr Franklyn        Final Clinical Impression(s) / ED Diagnoses Final diagnoses:  Respiratory distress  Altered mental status, unspecified altered mental status type    Rx / DC Orders ED Discharge Orders     None         Randol Simmonds, MD 07/07/23 1507

## 2023-07-07 NOTE — ED Notes (Signed)
 ED TO INPATIENT HANDOFF REPORT  ED Nurse Name and Phone #: [phone] Anthony Nelson Y   S Name/Age/Gender Anthony Nelson 63 y.o. male Room/Bed: TRACC/TRACC  Code Status   Code Status: Full Code  Home/SNF/Other    Is this baseline? No   Triage Complete: Triage complete  Chief Complaint Respiratory failure with hypoxia (HCC) [J96.91]  Triage Note No notes on file   Allergies Allergies  Allergen Reactions   Erythromycin Other (See Comments)    Allergy from childhood- does not take it    Level of Care/Admitting Diagnosis ED Disposition     ED Disposition  Admit   Condition  --   Comment  Hospital Area: [facility] [[id number]]  Level of Care: ICU [6]  May admit patient to [facility] or [facility] if equivalent level of care is available:: No  Covid Evaluation: Symptomatic Person Under Investigation (PUI) or recent exposure (last 10 days) *Testing Required*  Diagnosis: Respiratory failure with hypoxia [facility]) [[id number]]  Admitting Physician: ARLINDA RANKS [[id number]]  Attending Physician: ARLINDA RANKS [phone]  Certification:: I certify this patient will need inpatient services for at least 2 midnights  Expected Medical Readiness: 07/12/2023          B Medical/Surgery History Past Medical History:  Diagnosis Date   Atherosclerosis of aorta (HCC) 04/06/2020   Bronchitis    COPD (chronic obstructive pulmonary disease) (HCC)    Diastolic heart failure (HCC)    GERD (gastroesophageal reflux disease)    Pulmonary hypertension (HCC)    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 Oral 60 cm 07/07/23  1500  Oral  less than 1   Urethral Catheter [name] Double-lumen 16 Fr. 07/07/23  1520  Double-lumen  less than 1   Airway 7.5 mm 07/07/23  1440  -- less than 1            Intake/Output Last 24 hours No intake or output data in the 24 hours ending 07/07/23 1627  Labs/Imaging Results for orders placed or performed during the hospital encounter of 07/07/23 (from the past 48 hours)  Urine rapid drug screen (hosp performed)     Status: None   Collection Time: 07/07/23  2:24 PM  Result Value Ref Range   Opiates NONE DETECTED NONE DETECTED   Cocaine NONE DETECTED NONE DETECTED   Benzodiazepines NONE DETECTED NONE DETECTED   Amphetamines NONE DETECTED NONE DETECTED   Tetrahydrocannabinol NONE DETECTED NONE DETECTED   Barbiturates NONE DETECTED NONE DETECTED    Comment: (NOTE) DRUG SCREEN FOR MEDICAL PURPOSES ONLY.  IF CONFIRMATION IS NEEDED FOR ANY PURPOSE, NOTIFY LAB WITHIN 5 DAYS.  LOWEST DETECTABLE LIMITS FOR URINE DRUG SCREEN Drug Class                     Cutoff (ng/mL) Amphetamine and metabolites  1000 Barbiturate and metabolites    200 Benzodiazepine                 200 Opiates and metabolites        300 Cocaine and metabolites        300 THC                            50 Performed at [facility], [address] [place], [place] [address]   Urinalysis, Routine w reflex microscopic -Urine, Clean Catch     Status: Abnormal   Collection Time: 07/07/23  2:24 PM  Result Value Ref Range   Color, Urine YELLOW YELLOW   APPearance CLEAR CLEAR   Specific Gravity, Urine 1.016 1.005 - 1.030   pH 6.0 5.0 - 8.0   Glucose, UA NEGATIVE NEGATIVE mg/dL   Hgb urine dipstick SMALL (A) NEGATIVE   Bilirubin Urine NEGATIVE NEGATIVE    Ketones, ur 80 (A) NEGATIVE mg/dL   Protein, ur 899 (A) NEGATIVE mg/dL   Nitrite NEGATIVE NEGATIVE   Leukocytes,Ua NEGATIVE NEGATIVE   RBC / HPF 0-5 0 - 5 RBC/hpf   WBC, UA 0-5 0 - 5 WBC/hpf   Bacteria, UA NONE SEEN NONE SEEN   Squamous Epithelial / HPF 0-5 0 - 5 /HPF   Mucus PRESENT     Comment: Performed at [facility], [address], [place], [place] [address]  Ethanol     Status: None   Collection Time: 07/07/23  2:50 PM  Result Value Ref Range   Alcohol, Ethyl (B) <10 <10 mg/dL    Comment: (NOTE) Lowest detectable limit for serum alcohol is 10 mg/dL.  For medical purposes only. Performed at [facility], [address], [place], [place] [address]   Protime-INR     Status: None   Collection Time: 07/07/23  2:50 PM  Result Value Ref Range   Prothrombin Time 13.4 11.4 - 15.2 seconds   INR 1.0 0.8 - 1.2    Comment: (NOTE) INR goal varies based on device and disease states. Performed at [facility], [address], [place], [place] [address]   APTT     Status: None   Collection Time: 07/07/23  2:50 PM  Result Value Ref Range   aPTT 33 24 - 36 seconds    Comment: Performed at [facility], [address] [place], [place] [address]  CBC     Status: Abnormal   Collection Time: 07/07/23  2:50 PM  Result Value Ref Range   WBC 7.0 4.0 - 10.5 K/uL   RBC 4.10 (L) 4.22 - 5.81 MIL/uL   Hemoglobin 12.8 (L) 13.0 - 17.0 g/dL   HCT 57.0 60.9 - 47.9 %   MCV 104.6 (H) 80.0 - 100.0 fL   MCH 31.2 26.0 - 34.0 pg   MCHC 29.8 (L) 30.0 - 36.0 g/dL   RDW 87.5 88.4 - 84.4 %   Platelets 138 (L) 150 - 400 K/uL   nRBC 0.0 0.0 - 0.2 %    Comment: Performed at [facility], [address] [place], [place] [address]  Differential     Status: Abnormal   Collection Time: 07/07/23  2:50 PM  Result Value Ref Range   Neutrophils Relative % 87 %   Neutro Abs 6.1 1.7 - 7.7 K/uL   Lymphocytes Relative 4 %   Lymphs Abs 0.3 (L) 0.7 -  4.0 K/uL   Monocytes Relative 8  %   Monocytes Absolute 0.5 0.1 - 1.0 K/uL   Eosinophils Relative 0 %   Eosinophils Absolute 0.0 0.0 - 0.5 K/uL   Basophils Relative 0 %   Basophils Absolute 0.0 0.0 - 0.1 K/uL   Immature Granulocytes 1 %   Abs Immature Granulocytes 0.04 0.00 - 0.07 K/uL    Comment: Performed at [facility], [address] [place], [place] [address]  Comprehensive metabolic panel     Status: Abnormal   Collection Time: 07/07/23  2:50 PM  Result Value Ref Range   Sodium 138 135 - 145 mmol/L   Potassium 4.7 3.5 - 5.1 mmol/L   Chloride 77 (L) 98 - 111 mmol/L   CO2 >45 (H) 22 - 32 mmol/L   Glucose, Bld 124 (H) 70 - 99 mg/dL    Comment: Glucose reference range applies only to samples taken after fasting for at least 8 hours.   BUN 29 (H) 8 - 23 mg/dL   Creatinine, Ser 9.46 (L) 0.61 - 1.24 mg/dL   Calcium  8.8 (L) 8.9 - 10.3 mg/dL   Total Protein 7.1 6.5 - 8.1 g/dL   Albumin 3.9 3.5 - 5.0 g/dL   AST 30 15 - 41 U/L   ALT 20 0 - 44 U/L   Alkaline Phosphatase 39 38 - 126 U/L   Total Bilirubin 1.2 0.0 - 1.2 mg/dL   GFR, Estimated >39 >39 mL/min    Comment: (NOTE) Calculated using the CKD-EPI Creatinine Equation (2021)    Anion gap NOT CALCULATED 5 - 15    Comment: Performed at [facility], [address] [place], [place] [address]  I-Stat CG4 Lactic Acid     Status: None   Collection Time: 07/07/23  2:59 PM  Result Value Ref Range   Lactic Acid, Venous 1.1 0.5 - 1.9 mmol/L  I-Stat arterial blood gas, ED ([facility], [facility], [facility])     Status: Abnormal   Collection Time: 07/07/23  3:52 PM  Result Value Ref Range   pH, Arterial 7.650 (HH) 7.35 - 7.45   pCO2 arterial 48.9 (H) 32 - 48 mmHg   pO2, Arterial 523 (H) 83 - 108 mmHg   Bicarbonate 53.9 (H) 20.0 - 28.0 mmol/L   TCO2 >50 (H) 22 - 32 mmol/L   O2 Saturation 100 %   Acid-Base Excess 29.0 (H) 0.0 - 2.0 mmol/L   Sodium 134 (L) 135 - 145 mmol/L   Potassium 3.8 3.5 - 5.1 mmol/L   Calcium , Ion 1.00 (L) 1.15 - 1.40 mmol/L   HCT 41.0 39.0 - 52.0  %   Hemoglobin 13.9 13.0 - 17.0 g/dL   Sample type ARTERIAL    Comment NOTIFIED PHYSICIAN   I-Stat venous blood gas, ([facility], [facility], [facility])     Status: Abnormal   Collection Time: 07/07/23  4:08 PM  Result Value Ref Range   pH, Ven 7.392 7.25 - 7.43   pCO2, Ven 100.5 (HH) 44 - 60 mmHg   pO2, Ven 28 (LL) 32 - 45 mmHg   Bicarbonate 61.1 (H) 20.0 - 28.0 mmol/L   TCO2 >50 (H) 22 - 32 mmol/L   O2 Saturation 45 %   Acid-Base Excess 28.0 (H) 0.0 - 2.0 mmol/L   Sodium 135 135 - 145 mmol/L   Potassium 4.4 3.5 - 5.1 mmol/L   Calcium , Ion 1.01 (L) 1.15 - 1.40 mmol/L   HCT 46.0 39.0 - 52.0 %  Hemoglobin 15.6 13.0 - 17.0 g/dL   Sample type VENOUS    Comment NOTIFIED PHYSICIAN   I-Stat CG4 Lactic Acid     Status: Abnormal   Collection Time: 07/07/23  4:09 PM  Result Value Ref Range   Lactic Acid, Venous 3.2 (HH) 0.5 - 1.9 mmol/L   Comment NOTIFIED PHYSICIAN    DG Abd Portable 1 View Result Date: 07/07/2023 CLINICAL DATA:  Evaluate OG tube placement. EXAM: PORTABLE ABDOMEN - 1 VIEW COMPARISON:  Chest radiograph from earlier today FINDINGS: Interval placement of enteric tube. The tip is just above the level of the GE junction. The side port is 6.7 cm above the GE junction. Visualized lungs are hyperinflated with diffuse coarsened interstitial markings. IMPRESSION: Interval placement of enteric tube. The tip is just above the level of the GE junction. The side port is 6.7 cm above the GE junction. Recommend advancement of enteric tube. Electronically Signed   By: Waddell Calk M.D.   On: 07/07/2023 16:05   DG Chest Portable 1 View Result Date: 07/07/2023 CLINICAL DATA:  Respiratory distress EXAM: PORTABLE CHEST 1 VIEW COMPARISON:  Chest x-ray 05/02/2023 FINDINGS: Endotracheal tube tip is 6.3 cm above the carina. There some questionable pneumomediastinum at the level of the trachea. The lungs are hyperinflated, unchanged. There is no focal lung infiltrate, pleural effusion or pneumothorax. Cardiac silhouette  appears within normal limits. No acute fractures are seen. IMPRESSION: 1. Endotracheal tube tip is 6.3 cm above the carina. 2. Questionable pneumomediastinum at the level of the trachea. 3. No focal lung infiltrate. 4. Hyperinflated lungs compatible with emphysema. Emphysema (ICD10-J43.9). Electronically Signed   By: Greig Pique M.D.   On: 07/07/2023 15:16    Pending Labs Unresulted Labs (From admission, onward)     Start     Ordered   07/14/23 0500  Creatinine, serum  (enoxaparin  (LOVENOX )    CrCl >/= 30 ml/min)  Weekly,   R     Comments: while on enoxaparin  therapy    07/07/23 1624   07/08/23 0500  Triglycerides  (propofol  (DIPRIVAN ))  Every 72 hours,   R     Comments: While on propofol  (DIPRIVAN )    07/07/23 1429   07/08/23 0500  CBC  Tomorrow morning,   R        07/07/23 1624   07/08/23 0500  Basic metabolic panel  Tomorrow morning,   R        07/07/23 1624   07/08/23 0500  Blood gas, arterial  Tomorrow morning,   R        07/07/23 1624   07/08/23 0500  Magnesium   Tomorrow morning,   R        07/07/23 1624   07/08/23 0500  Phosphorus  Tomorrow morning,   R        07/07/23 1624   07/07/23 1624  Blood gas, arterial  Once,   R        07/07/23 1624   07/07/23 1624  Hemoglobin A1c  (Glycemic Control (SSI)  Q 4 Hours / Glycemic Control (SSI)  AC +/- HS)  Once,   R       Comments: To assess prior glycemic control    07/07/23 1624   07/07/23 1619  CBC  (enoxaparin  (LOVENOX )    CrCl >/= 30 ml/min)  Once,   R       Comments: Baseline for enoxaparin  therapy IF NOT ALREADY DRAWN.  Notify MD if PLT < 100 K.    07/07/23  1624   07/07/23 1619  Creatinine, serum  (enoxaparin  (LOVENOX )    CrCl >/= 30 ml/min)  Once,   R       Comments: Baseline for enoxaparin  therapy IF NOT ALREADY DRAWN.    07/07/23 1624   07/07/23 1424  Resp panel by RT-PCR (RSV, Flu A&B, Covid) Anterior Nasal Swab  (Asymptomatic - Covid)  Once,   URGENT        07/07/23 1426            Vitals/Pain Today's Vitals    07/07/23 1536 07/07/23 1545 07/07/23 1600 07/07/23 1615  BP: 93/69 107/80 (!) 142/117 (!) 126/102  Pulse:   (!) 49 85  Resp:  20 18 19   Temp:      TempSrc:      SpO2:   97% 93%  Height:        Isolation Precautions No active isolations  Medications Medications  fentaNYL  in NS (31mcg/ml) infusion-PREMIX (50 mcg/hr Intravenous New Bag/Given 07/07/23 1448)  fentaNYL  (SUBLIMAZE ) bolus via infusion 50-100 mcg (has no administration in time range)  propofol  (DIPRIVAN ) 1000 MG/100ML infusion (20 mcg/kg/min  60 kg (Order-Specific) Intravenous New Bag/Given 07/07/23 1446)  rocuronium  (ZEMURON ) injection (70 mg Intravenous Given 07/07/23 1432)  etomidate  (AMIDATE ) injection (16 mg Intravenous Given 07/07/23 1432)  docusate sodium  (COLACE) capsule 100 mg (has no administration in time range)  polyethylene glycol (MIRALAX  / GLYCOLAX ) packet 17 g (has no administration in time range)  enoxaparin  (LOVENOX ) injection 40 mg (has no administration in time range)  famotidine  (PEPCID ) tablet 20 mg (has no administration in time range)  pantoprazole  (PROTONIX ) injection 40 mg (has no administration in time range)  sodium chloride  0.9 % bolus 500 mL (has no administration in time range)  ondansetron  (ZOFRAN ) injection 4 mg (has no administration in time range)  insulin  aspart (novoLOG ) injection 0-9 Units (has no administration in time range)  ipratropium-albuterol  (DUONEB) 0.5-2.5 (3) MG/3ML nebulizer solution 3 mL (has no administration in time range)  methylPREDNISolone  sodium succinate (SOLU-MEDROL ) 125 mg/2 mL injection 80 mg (has no administration in time range)  azithromycin  (ZITHROMAX ) 500 mg in sodium chloride  0.9 % 250 mL IVPB (has no administration in time range)  cefTRIAXone  (ROCEPHIN ) 1 g in sodium chloride  0.9 % 100 mL IVPB (has no administration in time range)  sodium chloride  0.9 % bolus 1,000 mL (1,000 mLs Intravenous New Bag/Given 07/07/23 1519)    Mobility      Focused  Assessments    R Recommendations: See Admitting Provider Note  Report given to:   Additional Notes:

## 2023-07-07 NOTE — Progress Notes (Signed)
 RT attempted Anthony Nelson X2. Clotted off.

## 2023-07-07 NOTE — H&P (Signed)
 NAME:  Anthony Nelson, MRN:  [id number], DOB:  Sep 22, 1960, LOS: 0 ADMISSION DATE:  07/07/2023, CONSULTATION DATE: 07/07/2023 REFERRING MD:  Randol [facility] ED, CHIEF COMPLAINT: Respiratory failure  History of Present Illness:  63 year old man who is homeless and has a history of severe COPD. Was found by motel staff unresponsive when he failed to check out today. He was intubated in the ED.  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   Respiratory failure with hypoxia (HCC)    Past Surgical History:  Procedure Laterality Date   HERNIA REPAIR     Scheduled Meds:  enoxaparin  (LOVENOX ) injection  40 mg Subcutaneous Q24H   famotidine   20 mg Per Tube BID   insulin  aspart  0-9 Units Subcutaneous Q4H   methylPREDNISolone  (SOLU-MEDROL ) injection  80 mg Intravenous Q12H   pantoprazole  (PROTONIX ) IV  40 mg Intravenous QHS   Continuous Infusions:  azithromycin      cefTRIAXone  (ROCEPHIN )  IV     fentaNYL  infusion INTRAVENOUS 50 mcg/hr (07/07/23 1448)   propofol  (DIPRIVAN ) infusion 20 mcg/kg/min (07/07/23 1446)   sodium chloride      PRN Meds:.docusate sodium , etomidate , fentaNYL , ipratropium-albuterol , ondansetron  (ZOFRAN ) IV, polyethylene glycol, rocuronium    Significant Hospital Events: Including procedures, antibiotic start and stop dates in addition to other pertinent events   1/5 -admitted after intubation in ED.  Interim History / Subjective:  Waking up. Dyssynchronous with ventilator but not interacting meaningfully with care team.  Objective   Blood pressure (!) 126/102, pulse 85, temperature (!) 96.8 F (36 C), temperature source Temporal, resp. rate  19, height 6' (1.829 m), SpO2 93%.    Vent Mode: PRVC FiO2 (%):  [100 %] 100 % Set Rate:  [18 bmp] 18 bmp Vt Set:  [620 mL] 620 mL PEEP:  [5 cmH20] 5 cmH20 Plateau Pressure:  [33 cmH20] 33 cmH20  No intake or output data in the 24 hours ending 07/07/23 1630 There were no vitals filed for this visit.  Examination: General: Thin, cachectic appearing man HENT: Orally intubated, OGT tube in place.  Lungs: Wheezing diffusely. High airway pressures.  Cardiovascular: HS distant  Abdomen: soft with no distention. Extremities: no edema. Neuro: sedated, Moving all limbs but not following commands.  GU: Foley in place. Draining clear urine.  Ancillary Test Personally Reviewed:  CXR shows hyperinflated lungs but with no infiltrates. Initial pCO2 100, second pCO2 49 Oxygenation is normal pO2 of 523 Total CO2 greater than 45.  Creatinine 0.53. No leukocytosis. CT head is unremarkable.  CT chest shows severe emphysema.  CT abdomen shows large stool burden in the rectum. Assessment & Plan:  Acute hypoxic hypercarbic respiratory failure on background of chronic hypoxic hypercarbic respiratory failure secondary to severe COPD Pulmonary hypertension by history Diastolic heart failure by history Hypercarbic encephalopathy Chronic metabolic alkalosis  Plan:  -Full ventilator support.  Allow sufficient time for exhalation.  Decrease respiratory rate as presently have overcorrected and patient is unlikely to breathe spontaneously with elevated pH. -IV steroids, antibiotics, bronchodilators. -Continue sedation to maintain ventilator synchrony, target RASS -3  Best Practice (right click and Reselect all SmartList Selections daily)   Diet/type: tubefeeds DVT prophylaxis LMWH Pressure  ulcer(s): N/A GI prophylaxis: H2B Lines: N/A Foley:  Yes, and it is still needed Code Status:  full code Last date of multidisciplinary goals of care discussion [daughter updated at the bedside.]  CRITICAL  CARE Performed by: Fredia Alderton   Total critical care time: 40 minutes  Critical care time was exclusive of separately billable procedures and treating other patients.  Critical care was necessary to treat or prevent imminent or life-threatening deterioration.  Critical care was time spent personally by me on the following activities: development of treatment plan with patient and/or surrogate as well as nursing, discussions with consultants, evaluation of patient's response to treatment, examination of patient, obtaining history from patient or surrogate, ordering and performing treatments and interventions, ordering and review of laboratory studies, ordering and review of radiographic studies, pulse oximetry, re-evaluation of patient's condition and participation in multidisciplinary rounds.  Fredia Alderton, MD [facility] ICU Physician [facility] [place] [facility]  Pager: [phone] Mobile: [phone] After hours: [phone].

## 2023-07-08 ENCOUNTER — Inpatient Hospital Stay (HOSPITAL_COMMUNITY): Payer: No Typology Code available for payment source

## 2023-07-08 DIAGNOSIS — R4182 Altered mental status, unspecified: Secondary | ICD-10-CM

## 2023-07-08 DIAGNOSIS — E873 Alkalosis: Secondary | ICD-10-CM

## 2023-07-08 DIAGNOSIS — R0603 Acute respiratory distress: Secondary | ICD-10-CM

## 2023-07-08 DIAGNOSIS — J431 Panlobular emphysema: Secondary | ICD-10-CM

## 2023-07-08 LAB — BLOOD GAS, VENOUS
Acid-Base Excess: 22.7 mmol/L — ABNORMAL HIGH (ref 0.0–2.0)
Bicarbonate: 49.3 mmol/L — ABNORMAL HIGH (ref 20.0–28.0)
O2 Saturation: 77.9 %
Patient temperature: 37
pCO2, Ven: 59 mm[Hg] (ref 44–60)
pH, Ven: 7.53 — ABNORMAL HIGH (ref 7.25–7.43)
pO2, Ven: 36 mm[Hg] (ref 32–45)

## 2023-07-08 LAB — GLUCOSE, CAPILLARY
Glucose-Capillary: 120 mg/dL — ABNORMAL HIGH (ref 70–99)
Glucose-Capillary: 126 mg/dL — ABNORMAL HIGH (ref 70–99)
Glucose-Capillary: 128 mg/dL — ABNORMAL HIGH (ref 70–99)
Glucose-Capillary: 135 mg/dL — ABNORMAL HIGH (ref 70–99)
Glucose-Capillary: 136 mg/dL — ABNORMAL HIGH (ref 70–99)
Glucose-Capillary: 82 mg/dL (ref 70–99)

## 2023-07-08 LAB — BASIC METABOLIC PANEL
Anion gap: 14 (ref 5–15)
BUN: 26 mg/dL — ABNORMAL HIGH (ref 8–23)
CO2: 35 mmol/L — ABNORMAL HIGH (ref 22–32)
Calcium: 8.5 mg/dL — ABNORMAL LOW (ref 8.9–10.3)
Chloride: 85 mmol/L — ABNORMAL LOW (ref 98–111)
Creatinine, Ser: 0.71 mg/dL (ref 0.61–1.24)
GFR, Estimated: >60 mL/min (ref >60)
Glucose, Bld: 126 mg/dL — ABNORMAL HIGH (ref 70–99)
Potassium: 4.3 mmol/L (ref 3.5–5.1)
Sodium: 134 mmol/L — ABNORMAL LOW (ref 135–145)

## 2023-07-08 LAB — CBC
HCT: 35.9 % — ABNORMAL LOW (ref 39.0–52.0)
Hemoglobin: 10.9 g/dL — ABNORMAL LOW (ref 13.0–17.0)
MCH: 31.1 pg (ref 26.0–34.0)
MCHC: 30.4 g/dL (ref 30.0–36.0)
MCV: 102.3 fL — ABNORMAL HIGH (ref 80.0–100.0)
Platelets: 116 10*3/uL — ABNORMAL LOW (ref 150–400)
RBC: 3.51 MIL/uL — ABNORMAL LOW (ref 4.22–5.81)
RDW: 12.2 % (ref 11.5–15.5)
WBC: 7.9 10*3/uL (ref 4.0–10.5)
nRBC: 0 % (ref 0.0–0.2)

## 2023-07-08 LAB — MAGNESIUM
Magnesium: 2.1 mg/dL (ref 1.7–2.4)
Magnesium: 1.8 mg/dL (ref 1.7–2.4)

## 2023-07-08 LAB — TRIGLYCERIDES: Triglycerides: 84 mg/dL (ref <150)

## 2023-07-08 LAB — PHOSPHORUS
Phosphorus: <1 mg/dL — CL (ref 2.5–4.6)
Phosphorus: 2.8 mg/dL (ref 2.5–4.6)
Phosphorus: 2.3 mg/dL — ABNORMAL LOW (ref 2.5–4.6)

## 2023-07-08 LAB — TROPONIN I (HIGH SENSITIVITY): Troponin I (High Sensitivity): 52 ng/L — ABNORMAL HIGH (ref <18)

## 2023-07-08 MED ORDER — OSMOLITE 1.5 CAL PO LIQD
1000.0000 mL | ORAL | Status: DC
Start: 2023-07-08 — End: 2023-07-10
  Administered 2023-07-09: 1000 mL
  Filled 2023-07-08 (×4): qty 1000

## 2023-07-08 MED ORDER — DEXMEDETOMIDINE HCL IN NACL 400 MCG/100ML IV SOLN
0.0000 ug/kg/h | INTRAVENOUS | Status: DC
  Administered 2023-07-08: 0.4 ug/kg/h via INTRAVENOUS
  Administered 2023-07-09: 0.7 ug/kg/h via INTRAVENOUS
  Administered 2023-07-09 (×2): 0.9 ug/kg/h via INTRAVENOUS
  Administered 2023-07-10: 0.8 ug/kg/h via INTRAVENOUS
  Filled 2023-07-08 (×6): qty 100

## 2023-07-08 MED ORDER — CALCIUM GLUCONATE-NACL 2-0.675 GM/100ML-% IV SOLN
2.0000 g | Freq: Once | INTRAVENOUS | Status: AC
  Administered 2023-07-08: 2000 mg via INTRAVENOUS
  Filled 2023-07-08: qty 100

## 2023-07-08 MED ORDER — SODIUM PHOSPHATES 45 MMOLE/15ML IV SOLN
30.0000 mmol | Freq: Once | INTRAVENOUS | Status: AC
  Administered 2023-07-08: 30 mmol via INTRAVENOUS
  Filled 2023-07-08: qty 10

## 2023-07-08 MED ORDER — METHYLPREDNISOLONE SODIUM SUCC 40 MG IJ SOLR
40.0000 mg | Freq: Two times a day (BID) | INTRAMUSCULAR | Status: DC
  Administered 2023-07-08 – 2023-07-10 (×5): 40 mg via INTRAVENOUS
  Filled 2023-07-08 (×5): qty 1

## 2023-07-08 MED ORDER — THIAMINE MONONITRATE 100 MG PO TABS
100.0000 mg | ORAL_TABLET | Freq: Every day | ORAL | Status: DC
  Administered 2023-07-08 – 2023-07-09 (×2): 100 mg
  Filled 2023-07-08 (×2): qty 1

## 2023-07-08 MED ORDER — ORAL CARE MOUTH RINSE
15.0000 mL | OROMUCOSAL | Status: DC
  Administered 2023-07-08 – 2023-07-09 (×16): 15 mL via OROMUCOSAL

## 2023-07-08 MED ORDER — REVEFENACIN 175 MCG/3ML IN SOLN
175.0000 ug | Freq: Every day | RESPIRATORY_TRACT | Status: DC
  Administered 2023-07-08 – 2023-07-12 (×5): 175 ug via RESPIRATORY_TRACT
  Filled 2023-07-08 (×5): qty 3

## 2023-07-08 MED ORDER — ORAL CARE MOUTH RINSE: 15.0000 mL | OROMUCOSAL | Status: DC | PRN

## 2023-07-08 MED ORDER — ARFORMOTEROL TARTRATE 15 MCG/2ML IN NEBU
15.0000 ug | INHALATION_SOLUTION | Freq: Two times a day (BID) | RESPIRATORY_TRACT | Status: DC
  Administered 2023-07-08 – 2023-07-12 (×8): 15 ug via RESPIRATORY_TRACT
  Filled 2023-07-08 (×9): qty 2

## 2023-07-08 NOTE — Progress Notes (Addendum)
 eLink Physician-Brief Progress Note Patient Name: Anthony Nelson DOB: 1960/11/12 MRN: [id number]   Date of Service  07/08/2023  HPI/Events of Note  Notified of critically low phos (<1)  eICU Interventions  Placed order for 30mmol sodium phos to be given.  Potassium 4.3 Will continue to follow serial labs.          Anthony Nelson 07/08/2023, 4:03 AM   5:50 AM Alarms were alerting for ST elevations.  EKG done, no ST segment elevation noted, although there was upsloping in V4-V6.  Added on troponin to AM labs.  Will continue to monitor closely.   Unable to get arterial stick for ABG - will obtain VBG instead.

## 2023-07-08 NOTE — Progress Notes (Signed)
 NAME:  Anthony Nelson, MRN:  [id number], DOB:  Mar 21, 1961, LOS: 1 ADMISSION DATE:  07/07/2023, CONSULTATION DATE:  07/07/2023 REFERRING MD:  [facility]-ED Doc, CHIEF COMPLAINT: Acute respiratory failure  History of Present Illness:  63 year old man who is homeless and has a history of severe COPD. Was found by motel staff unresponsive when he failed to check out today. He was intubated in the ED.  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 rate 14, height 6' (1.829 m), weight 50.3 kg, SpO2 100%.    Vent Mode: PRVC FiO2 (%):  [40 %-100 %] 40 % Set Rate:  [12 bmp-18 bmp] 12 bmp Vt Set:  [570 mL-620 mL] 570 mL PEEP:  [5 cmH20] 5 cmH20 Plateau Pressure:  [16 cmH20-33 cmH20] 17 cmH20   Intake/Output Summary (Last 24 hours) at 07/08/2023 0759 Last data filed at 07/08/2023 0700 Gross per 24 hour  Intake 1188.8 ml  Output 800 ml  Net 388.8 ml   Filed Weights   07/08/23 0153  Weight: 50.3 kg    Examination: General: Cachectic, chronically ill-appearing HENT: Moist oral mucosa, endotracheal tube in place Lungs: Fair air entry, no wheezes, no rhonchi Cardiovascular: S1-S2 appreciated Abdomen: Bowel sounds appreciated Extremities: No  clubbing, no edema Neuro: Sedate, not able to get him to respond at present GU:   Resolved Hospital Problem list     Assessment & Plan:  Acute hypoxic/hypercarbic respiratory failure Underlying severe emphysema -Continue aggressive bronchodilator therapy  Continue mechanical ventilation  -Target TVol 6-8cc/kgIBW -Target Plateau Pressure < 30cm H20 -Target driving pressure less than 15 cm of water  -Target PaO2 55-65: titrate PEEP/FiO2 per protocol -Ventilator associated pneumonia prevention protocol  history of diastolic heart failure History of pulmonary hypertension -Most recent echocardiogram from 2023 shows normal pulmonary pressures  Encephalopathy -This is likely related to his hypercarbia  Desired RASS goal 0 to -1  Protein calorie malnutrition -Continue enteric nutrition  Best Practice (right click and Reselect all SmartList Selections daily)   Diet/type: tubefeeds DVT prophylaxis LMWH Pressure ulcer(s): N/A GI prophylaxis: H2B Lines: N/A Foley:  Yes, and it is still needed Code Status:  full code Last date of multidisciplinary goals of care discussion [no family at bedside, updated family 07/07/2023]  Labs   CBC: Recent Labs  Lab 07/07/23 1450 07/07/23 1552 07/07/23 1608 07/07/23 2046 07/08/23 0234  WBC 7.0  --   --   --  7.9  NEUTROABS 6.1  --   --   --   --   HGB 12.8* 13.9 15.6 12.6* 10.9*  HCT 42.9 41.0 46.0 37.0* 35.9*  MCV 104.6*  --   --   --  102.3*  PLT 138*  --   --   --  116*    Basic Metabolic  Panel: Recent Labs  Lab 07/07/23 1450 07/07/23 1552 07/07/23 1608 07/07/23 2046 07/08/23 0234  NA 138 134* 135 135 134*  K 4.7 3.8 4.4 3.9 4.3  CL 77*  --   --   --  85*  CO2 >45*  --   --   --  35*  GLUCOSE 124*  --   --   --  126*  BUN 29*  --   --   --  26*  CREATININE 0.53*  --   --   --  0.71  CALCIUM  8.8*  --   --   --  8.5*  MG  --   --   --   --  2.1  PHOS  --   --   --   --  <1.0*   GFR: Estimated Creatinine Clearance:  67.2 mL/min (by C-G formula based on SCr of 0.71 mg/dL). Recent Labs  Lab 07/07/23 1450 07/07/23 1459 07/07/23 1609 07/08/23 0234  WBC 7.0  --   --  7.9  LATICACIDVEN  --  1.1 3.2*  --     Liver Function Tests: Recent Labs  Lab 07/07/23 1450  AST 30  ALT 20  ALKPHOS 39  BILITOT 1.2  PROT 7.1  ALBUMIN 3.9   No results for input(s): LIPASE, AMYLASE in the last 168 hours. No results for input(s): AMMONIA in the last 168 hours.  ABG    Component Value Date/Time   PHART 7.550 (H) 07/07/2023 2046   PCO2ART 55.1 (H) 07/07/2023 2046   PO2ART 64 (L) 07/07/2023 2046   HCO3 49.3 (H) 07/08/2023 0652   TCO2 50 (H) 07/07/2023 2046   O2SAT 77.9 07/08/2023 0652     Coagulation Profile: Recent Labs  Lab 07/07/23 1450  INR 1.0    Cardiac Enzymes: No results for input(s): CKTOTAL, CKMB, CKMBINDEX, TROPONINI in the last 168 hours.  HbA1C: Hgb A1c MFr Bld  Date/Time Value Ref Range Status  03/08/2020 07:00 PM 5.9 (H) 4.8 - 5.6 % Final    Comment:    (NOTE) Pre diabetes:          5.7%-6.4%  Diabetes:              >6.4%  Glycemic control for   <7.0% adults with diabetes     CBG: Recent Labs  Lab 07/07/23 1757 07/07/23 1927 07/07/23 2317 07/08/23 0315  GLUCAP 134* 144* 114* 128*    Review of Systems:   Unresponsive  Past Medical History:  He,  has a past medical history of Atherosclerosis of aorta (HCC) (04/06/2020), Bronchitis, COPD (chronic obstructive pulmonary disease) (HCC), Diastolic heart failure (HCC), GERD (gastroesophageal reflux disease), Pulmonary hypertension (HCC), 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   Surgical History:   Past Surgical History:  Procedure Laterality Date   HERNIA REPAIR       Social History:   reports that he quit smoking about 2 years ago. His smoking use included cigarettes. He started smoking about 46 years ago. He  has a 4.4 pack-year smoking history. He has never used smokeless tobacco. He reports that he does not currently use alcohol. He reports that he does not use drugs.   Family History:  His family history includes CAD in his father; Cancer in his father and mother; Diabetes in his maternal grandmother; Heart attack in his paternal grandfather; Heart failure in his father; Kidney cancer in his paternal grandmother; Kidney disease in his maternal  grandmother.   Allergies Allergies  Allergen Reactions   Erythromycin Other (See Comments)    Allergy from childhood- does not take it     Home Medications  Prior to Admission medications   Medication Sig Start Date End Date Taking? Authorizing Provider  albuterol  (PROVENTIL ) (2.5 MG/3ML) 0.083% nebulizer solution USE 3 MLS BY NEBULIZATION EVERY 6 HOURS AS NEEDED FOR WHEEZING OR SHORTNESS OF BREATH. 06/27/23   Brien Belvie BRAVO, MD  albuterol  (VENTOLIN  HFA) 108 (90 Base) MCG/ACT inhaler Inhale 2 puffs into the lungs every 6 hours as needed for wheezing or shortness of breath. 04/02/23   Brien Belvie BRAVO, MD  aspirin  EC 81 MG tablet Take 1 tablet (81 mg total) by mouth daily. Swallow whole. 05/10/23 08/08/23  Ezenduka, Nkeiruka J, MD  Cholecalciferol  (VITAMIN D3) 50 MCG (2000 UT) TABS Take 2,000 Units by mouth daily. 09/28/21   Brien Belvie BRAVO, MD  docusate sodium  (COLACE) 100 MG capsule Take 1 capsule (100 mg total) by mouth 2 (two) times daily. 05/09/23   Ezenduka, Nkeiruka J, MD  famotidine  (PEPCID ) 20 MG tablet Take 1 tablet (20 mg total) by mouth 2 (two) times daily. 05/09/23 06/08/23  Ezenduka, Nkeiruka J, MD  Fluticasone -Umeclidin-Vilant (TRELEGY ELLIPTA ) 200-62.5-25 MCG/ACT AEPB Inhale 1 puff into the lungs daily. 06/05/23 08/04/23  Newlin, Enobong, MD  furosemide  (LASIX ) 20 MG tablet Take 1 tablet (20 mg total) by mouth daily as needed. Patient taking differently: Take 20 mg by mouth daily as needed for fluid. 09/28/21 04/28/23  Brien Belvie BRAVO, MD   GINSENG PO Take 1 capsule by mouth daily.    [provider]  Multiple Vitamins-Minerals (MULTIVITAMIN WITH MINERALS) tablet Take 1 tablet by mouth daily with breakfast.    [provider]  OXYGEN  Inhale 2 L/min into the lungs continuous.     [provider]  rosuvastatin  (CRESTOR ) 40 MG tablet Take 1 tablet (40 mg) by mouth daily. 06/05/22 08/04/23  Brien Belvie BRAVO, MD  sodium chloride  (OCEAN) 0.65 % SOLN nasal spray Place 2 sprays into both nostrils in the morning, at noon, and at bedtime. Patient taking differently: Place 2 sprays into both nostrils 3 (three) times daily as needed for congestion. 12/05/20   Brien Belvie BRAVO, MD  VITAMIN E PO Take 1 capsule by mouth daily.    [provider]     The patient is critically ill with multiple organ systems failure and requires high complexity decision making for assessment and support, frequent evaluation and titration of therapies, application of advanced monitoring technologies and extensive interpretation of multiple databases. Critical Care Time devoted to patient care services described in this note independent of APP/resident time (if applicable)  is 33 minutes.   Jennet Epley MD [facility] Personal pager: See Amion If unanswered, please page CCM On-call: [phone]

## 2023-07-08 NOTE — TOC Initial Note (Signed)
 Transition of Care [facility]) - Initial/Assessment Note    Patient Details  Name: Anthony Nelson MRN: [id number] Date of Birth: Feb 05, 1961  Transition of Care [facility]) CM/SW Contact:    Lauraine FORBES Saa, LCSW Phone Number: 07/08/2023, 5:01 PM   Clinical Narrative:  5:01 PM CSW received call from housing case worker at [employer] informing CSW that case worker has been attempting to obtain housing for patient. TOC will continue to follow.  Transition of Care Asessment: Insurance and Status: Insurance coverage has been reviewed Patient has primary care physician: Yes (Pulmonary) Home environment has been reviewed: Motel   Prior/Current Home Services: No current home services Social Drivers of Health Review:  (SDOH needs to be updated) Readmission risk has been reviewed: Yes Transition of care needs: no transition of care needs at this time   Admission diagnosis:  Metabolic alkalosis [E87.3] Respiratory distress [R06.03] Respiratory failure with hypoxia (HCC) [J96.91] Altered mental status, unspecified altered mental status type [R41.82] Patient Active Problem List   Diagnosis Date Noted   Respiratory failure with hypoxia (HCC) 07/07/2023   COPD with acute exacerbation (HCC) 04/28/2023   Protein-calorie malnutrition, severe 04/11/2023   COPD exacerbation (HCC) 04/09/2023   Chronic right-sided congestive heart failure (HCC) 01/16/2023   Homeless 01/16/2023   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   Gastroesophageal reflux disease 01/29/2022   Hyperlipidemia 10/04/2020   Macrocytosis 09/03/2020   Atherosclerosis of aorta (HCC) 04/06/2020   Weight loss 08/11/2019   Chronic diastolic heart failure (HCC) 04/25/2019   Chronic respiratory failure with hypoxia and hypercapnia (HCC) 04/25/2019   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 [place] [address] Phone: [phone] Fax: [phone]  [facility] - [facility] [address] [place] [address] Phone: [phone] Fax: [phone]     Social Drivers of Health (SDOH) Social History: SDOH Screenings   Food Insecurity: No Food Insecurity (04/28/2023)  Recent Concern: Food Insecurity - Food Insecurity Present (04/10/2023)  Housing: Low Risk  (04/28/2023)  Recent Concern: Housing - Medium Risk (04/10/2023)  Transportation Needs: No Transportation Needs (04/29/2023)  Recent Concern: Transportation Needs - Unmet Transportation Needs (04/28/2023)  Utilities: Not At Risk (04/28/2023)  Alcohol Screen: Low Risk  (12/26/2022)  Depression (PHQ2-9): Low Risk  (01/16/2023)  Physical Activity: Insufficiently Active (12/26/2022)  Social Connections: Moderately Isolated (12/26/2022)  Stress: No Stress Concern Present (12/26/2022)  Tobacco Use: Medium Risk (04/28/2023)   SDOH Interventions:     Readmission Risk Interventions     No data to display

## 2023-07-08 NOTE — Plan of Care (Signed)
  Problem: Coping: Goal: Ability to adjust to condition or change in health will improve Outcome: Progressing   Problem: Health Behavior/Discharge Planning: Goal: Ability to identify and utilize available resources and services will improve Outcome: Progressing   

## 2023-07-08 NOTE — Progress Notes (Signed)
 Initial Nutrition Assessment  DOCUMENTATION CODES:  Underweight, Severe malnutrition in context of chronic illness  INTERVENTION:  Initiate tube feeding via OGT: Osmolite 1.5 at 55 ml/h (1320 ml per day) Start at 15 and advance by 10 q12h to goal of 55 Provides 1980 kcal, 83 gm protein, 1006 ml free water  daily Monitor magnesium  and phosphorus every 12 hours x 4 occurrences, MD to replete as needed, as pt is at risk for refeeding syndrome given severe malnutrition. Thiamine  100mg  x 5 days  NUTRITION DIAGNOSIS:  Severe Malnutrition related to chronic illness (CHF, COPD) as evidenced by severe muscle depletion, severe fat depletion.  GOAL:  Patient will meet greater than or equal to 90% of their needs  MONITOR:  Vent status, Labs, Weight trends, TF tolerance  REASON FOR ASSESSMENT:  Consult Enteral/tube feeding initiation and management  ASSESSMENT:  Pt with hx of COPD, GERD, and CHF presented to ED after he was found altered and unresponsive. Intubated in ED.  1/5 - admitted, intubated   Patient is currently intubated on ventilator support. No family in room at the time of assessment. Consult received to start enteral feeds. Noted that OGT in place terminates in the esophagus. Discussed with RN who advanced 10cm, repeat XR pending.   On exam, pt very thin with significant fat and muscle deficits consistent with severe malnutrition and chronic undernourishment.   Pt with phosphorus <1 this AM. Being replaced by [facility]. Will initiate TF and advance very slowly to allow time to monitor and replace electrolytes in the AM if needed.   MV: 6.6 L/min Temp (24hrs), Avg:100.5 F (38.1 C), Min:96.8 F (36 C), Max:101.5 F (38.6 C)  Propofol : 3.6 ml/hr (95 kcal/d)  Admit / Current weight: 50.3 kg  13.4% weight loss noted over the last 6 months which if accurate is severe   Intake/Output Summary (Last 24 hours) at 07/08/2023 1444 Last data filed at 07/08/2023 1300 Gross per 24 hour   Intake 1662.31 ml  Output 1020 ml  Net 642.31 ml  Net IO Since Admission: 642.31 mL [07/08/23 1444]  Drains/Lines: OGT UOP x 24 hours  Nutritionally Relevant Medications: Scheduled Meds:  famotidine   20 mg Per Tube BID   insulin  aspart  0-9 Units Subcutaneous Q4H   methylPREDNISolone    40 mg Intravenous Q12H   Continuous Infusions:  calcium  gluconate     cefTRIAXone  (ROCEPHIN )  IV 200 mL/hr at 07/07/23 1900   doxycycline  (VIBRAMYCIN ) IV 125 mL/hr at 07/08/23 0700   norepinephrine  (LEVOPHED ) Adult infusion 5 mcg/min (07/08/23 0724)   propofol  (DIPRIVAN ) infusion 10 mcg/kg/min (07/08/23 0700)   sodium PHOSPHATE  IVPB (in mmol) 43 mL/hr at 07/08/23 0700   PRN Meds: docusate, ondansetron , polyethylene glycol  Labs Reviewed: Na 134, chloride 85 BUN 26 Phosphorus <1.0 Mg 2.1 CBG ranges from 114-144 mg/dL over the last 24 hours HgbA1c 5.9% (03/08/20)  NUTRITION - FOCUSED PHYSICAL EXAM: Flowsheet Row Most Recent Value  Orbital Region Severe depletion  Upper Arm Region Severe depletion  Thoracic and Lumbar Region Severe depletion  Buccal Region Severe depletion  Temple Region Severe depletion  Clavicle Bone Region Severe depletion  Clavicle and Acromion Bone Region Severe depletion  Scapular Bone Region Severe depletion  Dorsal Hand Severe depletion  Patellar Region Severe depletion  Anterior Thigh Region Severe depletion  Posterior Calf Region Severe depletion  Edema (RD Assessment) None  Hair Reviewed  Eyes Reviewed  Mouth Unable to assess  [ETT and bite block]  Skin Reviewed  Nails Unable to assess  [  mittens]    Diet Order:   Diet Order             Diet NPO time specified  Diet effective now                   EDUCATION NEEDS:  Not appropriate for education at this time  Skin:  Skin Assessment: Reviewed RN Assessment  Last BM:  prior to admission  Height:  Ht Readings from Last 1 Encounters:  07/08/23 6' (1.829 m)    Weight:  Wt Readings  from Last 1 Encounters:  07/08/23 50.3 kg    Ideal Body Weight:  80.9 kg  BMI:  Body mass index is 15.04 kg/m.  Estimated Nutritional Needs:  Kcal:  1700-2000 kcal/d Protein:  80-100g/d Fluid:  1.7-2L/d    Vernell Lukes, RD, LDN Registered Dietitian II Please reach out via secure chat Weekend on-call pager # available in [facility]

## 2023-07-08 NOTE — Progress Notes (Signed)
 Switch to precedex so we can try to wean

## 2023-07-09 DIAGNOSIS — E873 Alkalosis: Secondary | ICD-10-CM | POA: Diagnosis not present

## 2023-07-09 DIAGNOSIS — J431 Panlobular emphysema: Secondary | ICD-10-CM | POA: Diagnosis not present

## 2023-07-09 DIAGNOSIS — R0603 Acute respiratory distress: Secondary | ICD-10-CM | POA: Diagnosis not present

## 2023-07-09 DIAGNOSIS — R4182 Altered mental status, unspecified: Secondary | ICD-10-CM | POA: Diagnosis not present

## 2023-07-09 LAB — GLUCOSE, CAPILLARY
Glucose-Capillary: 125 mg/dL — ABNORMAL HIGH (ref 70–99)
Glucose-Capillary: 126 mg/dL — ABNORMAL HIGH (ref 70–99)
Glucose-Capillary: 134 mg/dL — ABNORMAL HIGH (ref 70–99)
Glucose-Capillary: 135 mg/dL — ABNORMAL HIGH (ref 70–99)
Glucose-Capillary: 90 mg/dL (ref 70–99)
Glucose-Capillary: 94 mg/dL (ref 70–99)

## 2023-07-09 LAB — PHOSPHORUS
Phosphorus: 3 mg/dL (ref 2.5–4.6)
Phosphorus: 4.2 mg/dL (ref 2.5–4.6)

## 2023-07-09 LAB — HEMOGLOBIN A1C
Hgb A1c MFr Bld: 5.6 % (ref 4.8–5.6)
Mean Plasma Glucose: 114 mg/dL

## 2023-07-09 LAB — MAGNESIUM
Magnesium: 1.7 mg/dL (ref 1.7–2.4)
Magnesium: 1.8 mg/dL (ref 1.7–2.4)

## 2023-07-09 MED ORDER — THIAMINE MONONITRATE 100 MG PO TABS
100.0000 mg | ORAL_TABLET | Freq: Every day | ORAL | Status: AC
  Administered 2023-07-10 – 2023-07-12 (×3): 100 mg via ORAL
  Filled 2023-07-09 (×3): qty 1

## 2023-07-09 MED ORDER — SENNA 8.6 MG PO TABS
2.0000 | ORAL_TABLET | Freq: Every day | ORAL | Status: DC
  Administered 2023-07-09 – 2023-07-11 (×3): 17.2 mg via ORAL
  Filled 2023-07-09 (×3): qty 2

## 2023-07-09 MED ORDER — ORAL CARE MOUTH RINSE
15.0000 mL | OROMUCOSAL | Status: DC
Start: 2023-07-09 — End: 2023-07-09
  Administered 2023-07-09: 15 mL via OROMUCOSAL

## 2023-07-09 MED ORDER — DOCUSATE SODIUM 100 MG PO CAPS
100.0000 mg | ORAL_CAPSULE | Freq: Two times a day (BID) | ORAL | Status: DC
  Administered 2023-07-09 – 2023-07-12 (×6): 100 mg via ORAL
  Filled 2023-07-09 (×6): qty 1

## 2023-07-09 MED ORDER — POLYETHYLENE GLYCOL 3350 17 G PO PACK
17.0000 g | PACK | Freq: Every day | ORAL | Status: DC
  Administered 2023-07-10 – 2023-07-12 (×3): 17 g via ORAL
  Filled 2023-07-09 (×3): qty 1

## 2023-07-09 MED ORDER — ORAL CARE MOUTH RINSE: 15.0000 mL | OROMUCOSAL | Status: DC | PRN

## 2023-07-09 MED ORDER — DOCUSATE SODIUM 50 MG/5ML PO LIQD
100.0000 mg | Freq: Two times a day (BID) | ORAL | Status: DC
  Administered 2023-07-09: 100 mg
  Filled 2023-07-09: qty 10

## 2023-07-09 MED ORDER — FAMOTIDINE 20 MG PO TABS
20.0000 mg | ORAL_TABLET | Freq: Two times a day (BID) | ORAL | Status: DC
  Administered 2023-07-09 – 2023-07-12 (×6): 20 mg via ORAL
  Filled 2023-07-09 (×6): qty 1

## 2023-07-09 MED ORDER — SENNA 8.6 MG PO TABS: 2.0000 | ORAL_TABLET | Freq: Every day | ORAL | Status: DC

## 2023-07-09 MED ORDER — POLYETHYLENE GLYCOL 3350 17 G PO PACK
17.0000 g | PACK | Freq: Every day | ORAL | Status: DC
  Administered 2023-07-09: 17 g
  Filled 2023-07-09: qty 1

## 2023-07-09 NOTE — Progress Notes (Signed)
 Spoke with patient and daughter at bedside  Patient gets agitated during conversations, may not be fully oriented at present  Updated daughter about plans for transitioning out of the ICU and possible discharge in the next probably 48 hours

## 2023-07-09 NOTE — Procedures (Signed)
 Extubation Procedure Note  Patient Details:   Name: Anthony Nelson DOB: [birth year] MRN: [id number]   Airway Documentation:    Vent end date: 07/09/23 Vent end time: 1042   Evaluation  O2 sats: transiently fell during during procedure Complications: No apparent complications Patient did tolerate procedure well. Bilateral Breath Sounds: Diminished   Patient extubated per MD order & placed on 4L [place]. Pt is able to speak & cough post extubation. Currently breathing well with no distress.  Tish Eva Lenis 07/09/2023, 10:48 AM

## 2023-07-09 NOTE — Progress Notes (Signed)
 NAME:  Anthony Nelson, MRN:  [id number], DOB:  [birth year], LOS: 2 ADMISSION DATE:  07/07/2023, CONSULTATION DATE:  07/07/2023 REFERRING MD:  [facility]-ED Doc, CHIEF COMPLAINT: Acute respiratory failure  History of Present Illness:  63 year old man who is homeless and has a history of severe COPD. Was found by motel staff unresponsive when he failed to check out today. He was intubated in the ED.  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 rate 16, height 6' (1.829 m), weight 51.1 kg, SpO2 100%.    Vent Mode: PRVC FiO2 (%):  [30 %-40 %] 30 % Set Rate:  [12 bmp] 12 bmp Vt Set:  [570 mL] 570 mL PEEP:  [5 cmH20] 5 cmH20 Pressure Support:  [10 cmH20] 10 cmH20 Plateau Pressure:  [16 cmH20-21 cmH20] 18 cmH20   Intake/Output Summary (Last 24 hours) at 07/09/2023 1037 Last data filed at 07/09/2023 0847 Gross per 24 hour  Intake 1553.92 ml  Output 580 ml  Net 973.92 ml   Filed Weights   07/08/23 0153 07/09/23 0500  Weight: 50.3 kg 51.1 kg    Examination: General: Cachectic, chronically  ill-appearing HENT: Moist oral mucosa, endotracheal tube in place Lungs: Decreased air movement, no wheezes, no rhonchi Cardiovascular: S1-S2 appreciated Abdomen: Bowel sounds appreciated Extremities: No clubbing, no edema Neuro: Sedate, not able to get him to respond at present GU:   Resolved Hospital Problem list     Assessment & Plan:  Acute hypoxic/hypercarbic respiratory failure Underlying severe emphysema -Continue bronchodilator treatments  Continue mechanical ventilation -Target TVol 6-8cc/kgIBW -Target Plateau Pressure < 30cm H20 -Target driving pressure less than 15 cm of water  -Target PaO2 55-65: titrate PEEP/FiO2 per protocol -Ventilator associated pneumonia prevention protocol  Decision will be made regarding his significantly agitation will with trying to wean Has no infiltrative process on x-ray, continue aggressive bronchodilator therapy  History of diastolic heart failure History of pulmonary hypertension -Most recent echocardiogram from 2023 shows normal pulmonary pressures  Encephalopathy -This appears to be improving  Protein calorie malnutrition -On enteric nutrition  Will aggressively wean and possibly try and extubate today  Best Practice (right click and Reselect all SmartList Selections daily)   Diet/type: tubefeeds DVT prophylaxis LMWH Pressure ulcer(s): N/A GI prophylaxis: H2B Lines: N/A Foley:  Yes, and it is still needed Code Status:  full code Last date of multidisciplinary goals of care discussion [no family at bedside, updated family 07/07/2023]  Labs   CBC: Recent Labs  Lab 07/07/23 1450 07/07/23 1552 07/07/23 1608 07/07/23 2046 07/08/23 0234  WBC 7.0  --   --   --  7.9  NEUTROABS 6.1  --   --   --   --  HGB 12.8* 13.9 15.6 12.6* 10.9*  HCT 42.9 41.0 46.0 37.0* 35.9*  MCV 104.6*  --   --   --  102.3*  PLT 138*  --   --   --  116*    Basic Metabolic Panel: Recent Labs  Lab 07/07/23 1450 07/07/23 1552  07/07/23 1608 07/07/23 2046 07/08/23 0234 07/08/23 1355 07/08/23 1843 07/09/23 0711  NA 138 134* 135 135 134*  --   --   --   K 4.7 3.8 4.4 3.9 4.3  --   --   --   CL 77*  --   --   --  85*  --   --   --   CO2 >45*  --   --   --  35*  --   --   --   GLUCOSE 124*  --   --   --  126*  --   --   --   BUN 29*  --   --   --  26*  --   --   --   CREATININE 0.53*  --   --   --  0.71  --   --   --   CALCIUM  8.8*  --   --   --  8.5*  --   --   --   MG  --   --   --   --  2.1  --  1.8 1.7  PHOS  --   --   --   --  <1.0* 2.8 2.3* 3.0   GFR: Estimated Creatinine Clearance: 68.3 mL/min (by C-G formula based on SCr of 0.71 mg/dL). Recent Labs  Lab 07/07/23 1450 07/07/23 1459 07/07/23 1609 07/08/23 0234  WBC 7.0  --   --  7.9  LATICACIDVEN  --  1.1 3.2*  --     Liver Function Tests: Recent Labs  Lab 07/07/23 1450  AST 30  ALT 20  ALKPHOS 39  BILITOT 1.2  PROT 7.1  ALBUMIN 3.9   No results for input(s): LIPASE, AMYLASE in the last 168 hours. No results for input(s): AMMONIA in the last 168 hours.  ABG    Component Value Date/Time   PHART 7.550 (H) 07/07/2023 2046   PCO2ART 55.1 (H) 07/07/2023 2046   PO2ART 64 (L) 07/07/2023 2046   HCO3 49.3 (H) 07/08/2023 0652   TCO2 50 (H) 07/07/2023 2046   O2SAT 77.9 07/08/2023 0652     Coagulation Profile: Recent Labs  Lab 07/07/23 1450  INR 1.0    Cardiac Enzymes: No results for input(s): CKTOTAL, CKMB, CKMBINDEX, TROPONINI in the last 168 hours.  HbA1C: Hgb A1c MFr Bld  Date/Time Value Ref Range Status  07/08/2023 02:34 AM 5.6 4.8 - 5.6 % Final    Comment:    (NOTE)         Prediabetes: 5.7 - 6.4         Diabetes: >6.4         Glycemic control for adults with diabetes: <7.0   03/08/2020 07:00 PM 5.9 (H) 4.8 - 5.6 % Final    Comment:    (NOTE) Pre diabetes:          5.7%-6.4%  Diabetes:              >6.4%  Glycemic control for   <7.0% adults with diabetes     CBG: Recent Labs  Lab  07/08/23 1702 07/08/23 1952 07/08/23 2356 07/09/23 0347 07/09/23 0809  GLUCAP 82  135* 120* 125* 126*    Review of Systems:   Unresponsive  Past Medical History:  He,  has a past medical history of Atherosclerosis of aorta (HCC) (04/06/2020), Bronchitis, COPD (chronic obstructive pulmonary disease) (HCC), Diastolic heart failure (HCC), GERD (gastroesophageal reflux disease), Pulmonary hypertension (HCC), 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   Surgical History:   Past Surgical History:  Procedure Laterality Date   HERNIA REPAIR       Social History:   reports that he quit smoking about 2 years ago. His smoking use included cigarettes. He started smoking about 47 years ago. He has a 4.4 pack-year smoking history. He has never used smokeless tobacco. He reports that he does not currently use alcohol. He reports that he does not use drugs.   Family History:  His family history includes CAD in his father; Cancer in his father and mother; Diabetes in his maternal grandmother; Heart attack in his paternal grandfather; Heart failure in his father; Kidney cancer in his paternal grandmother; Kidney disease in his maternal grandmother.   Allergies Allergies  Allergen Reactions   Erythromycin Other (See Comments)    Allergy from childhood- does not take it     Home Medications  Prior to Admission medications   Medication Sig Start Date End Date Taking? Authorizing Provider  albuterol  (PROVENTIL ) (2.5 MG/3ML) 0.083% nebulizer solution USE 3 MLS BY NEBULIZATION EVERY 6 HOURS AS NEEDED FOR WHEEZING OR SHORTNESS OF BREATH. 06/27/23   Brien Belvie BRAVO, MD  albuterol  (VENTOLIN  HFA) 108 [phone] Base) MCG/ACT inhaler Inhale 2 puffs into the lungs every 6 hours as needed for wheezing or shortness of breath. 04/02/23   Brien Belvie BRAVO, MD  aspirin  EC 81 MG tablet Take 1 tablet (81 mg total) by mouth daily.  Swallow whole. 05/10/23 08/08/23  Ezenduka, Nkeiruka J, MD  Cholecalciferol  (VITAMIN D3) 50 MCG (2000 UT) TABS Take 2,000 Units by mouth daily. 09/28/21   Brien Belvie BRAVO, MD  docusate sodium  (COLACE) 100 MG capsule Take 1 capsule (100 mg total) by mouth 2 (two) times daily. 05/09/23   Ezenduka, Nkeiruka J, MD  famotidine  (PEPCID ) 20 MG tablet Take 1 tablet (20 mg total) by mouth 2 (two) times daily. 05/09/23 06/08/23  Ezenduka, Nkeiruka J, MD  Fluticasone -Umeclidin-Vilant (TRELEGY ELLIPTA ) 200-62.5-25 MCG/ACT AEPB Inhale 1 puff into the lungs daily. 06/05/23 08/04/23  Newlin, Enobong, MD  furosemide  (LASIX ) 20 MG tablet Take 1 tablet (20 mg total) by mouth daily as needed. Patient taking differently: Take 20 mg by mouth daily as needed for fluid. 09/28/21 04/28/23  Brien Belvie BRAVO, MD  GINSENG PO Take 1 capsule by mouth daily.    [provider]  Multiple Vitamins-Minerals (MULTIVITAMIN WITH MINERALS) tablet Take 1 tablet by mouth daily with breakfast.    [provider]  OXYGEN  Inhale 2 L/min into the lungs continuous.     [provider]  rosuvastatin  (CRESTOR ) 40 MG tablet Take 1 tablet (40 mg) by mouth daily. 06/05/22 08/04/23  Brien Belvie BRAVO, MD  sodium chloride  (OCEAN) 0.65 % SOLN nasal spray Place 2 sprays into both nostrils in the morning, at noon, and at bedtime. Patient taking differently: Place 2 sprays into both nostrils 3 (three) times daily as needed for congestion. 12/05/20   Brien Belvie BRAVO, MD  VITAMIN E PO Take 1 capsule by mouth daily.    [provider]    The patient is critically ill with multiple  organ systems failure and requires high complexity decision making for assessment and support, frequent evaluation and titration of therapies, application of advanced monitoring technologies and extensive interpretation of multiple databases. Critical Care Time devoted to patient care services described in this note independent of APP/resident time (if  applicable)  is 33 minutes.   Jennet Epley MD [facility] Personal pager: See Amion If unanswered, please page CCM On-call: #[phone]

## 2023-07-09 NOTE — Progress Notes (Signed)
 Pharmacy ICU Bowel Regimen Consult Note   Current Inpatient Medications for Bowel Management:  Docusate 100 mg BID PRN, PEG daily PRN (no doses of either given this admission)  Assessment: Anthony Nelson is a 63 y.o. year old male admitted on 07/07/2023. Constipation identified as acute opioid-induced constipation . Bowel regimen assessment completed by Camellia Hof, RN on 07/09/23. LBM on PTA. Noted tube feeds have been running for less than 24 hours.   [x]  Bowel sounds present  [x]  No abdominal tenderness  []  Passing gas   Plan: Start docusate 100 mg BID, senna 2 tabs at bedtime, PEG 17 gm every morning MD contacted (if needed): Dr. Neda  Thank you for allowing pharmacy to participate in this patient's care.  Morna Breach, PharmD PGY-1 Acute Care Pharmacy Resident 07/09/2023 9:24 AM

## 2023-07-09 NOTE — TOC CM/SW Note (Addendum)
 Transition of Care [facility]) - Inpatient Brief Assessment   Patient Details  Name: Anthony Nelson MRN: [id number] Date of Birth: 08-17-1960  Transition [facility]) CM/SW Contact:    Lauraine FORBES Saa, LCSW Phone Number: 07/09/2023, 2:55 PM   Clinical Narrative:  2:55 PM Housing case worker, [name] [phone]), of [place] called this CSW to follow up on expected discharge plan/date. CSW informed [name] that per progressions, patient is to transfer out of ICU and discharge in the near future. [name] stated that patient is to sign a lease tomorrow. CSW informed [name] that patient has been extubated but may not be currently fully oriented. [name] requested updated on discharge plans and family visits, which CSW accepted.  5:45 PM CSW called [name] to provide updates on discharge plans and family visits (daughter has been involved with patient's case and has visited; hospitalist expects patient to discharge within the next 48 hours). There was no response and a voicemail was left.  Transition of Care Asessment: Insurance and Status: Insurance coverage has been reviewed Patient has primary care physician: Yes (Pulmonary) Home environment has been reviewed: Motel   Prior/Current Home Services: No current home services Social Drivers of Health Review:  (SDOH needs to be updated) Readmission risk has been reviewed: Yes Transition of care needs: no transition of care needs at this time

## 2023-07-10 DIAGNOSIS — G9341 Metabolic encephalopathy: Secondary | ICD-10-CM

## 2023-07-10 DIAGNOSIS — J441 Chronic obstructive pulmonary disease with (acute) exacerbation: Secondary | ICD-10-CM

## 2023-07-10 DIAGNOSIS — E43 Unspecified severe protein-calorie malnutrition: Secondary | ICD-10-CM | POA: Diagnosis not present

## 2023-07-10 DIAGNOSIS — J9601 Acute respiratory failure with hypoxia: Secondary | ICD-10-CM | POA: Diagnosis not present

## 2023-07-10 DIAGNOSIS — J9612 Chronic respiratory failure with hypercapnia: Secondary | ICD-10-CM

## 2023-07-10 LAB — CBC
HCT: 36.3 % — ABNORMAL LOW (ref 39.0–52.0)
Hemoglobin: 11.7 g/dL — ABNORMAL LOW (ref 13.0–17.0)
MCH: 31.1 pg (ref 26.0–34.0)
MCHC: 32.2 g/dL (ref 30.0–36.0)
MCV: 96.5 fL (ref 80.0–100.0)
Platelets: 116 10*3/uL — ABNORMAL LOW (ref 150–400)
RBC: 3.76 MIL/uL — ABNORMAL LOW (ref 4.22–5.81)
RDW: 12.6 % (ref 11.5–15.5)
WBC: 15.3 10*3/uL — ABNORMAL HIGH (ref 4.0–10.5)
nRBC: 0 % (ref 0.0–0.2)

## 2023-07-10 LAB — BASIC METABOLIC PANEL
Anion gap: 12 (ref 5–15)
BUN: 12 mg/dL (ref 8–23)
CO2: 37 mmol/L — ABNORMAL HIGH (ref 22–32)
Calcium: 9 mg/dL (ref 8.9–10.3)
Chloride: 84 mmol/L — ABNORMAL LOW (ref 98–111)
Creatinine, Ser: 0.49 mg/dL — ABNORMAL LOW (ref 0.61–1.24)
GFR, Estimated: >60 mL/min (ref >60)
Glucose, Bld: 113 mg/dL — ABNORMAL HIGH (ref 70–99)
Potassium: 4.1 mmol/L (ref 3.5–5.1)
Sodium: 133 mmol/L — ABNORMAL LOW (ref 135–145)

## 2023-07-10 LAB — GLUCOSE, CAPILLARY
Glucose-Capillary: 114 mg/dL — ABNORMAL HIGH (ref 70–99)
Glucose-Capillary: 139 mg/dL — ABNORMAL HIGH (ref 70–99)
Glucose-Capillary: 95 mg/dL (ref 70–99)
Glucose-Capillary: 131 mg/dL — ABNORMAL HIGH (ref 70–99)
Glucose-Capillary: 101 mg/dL — ABNORMAL HIGH (ref 70–99)
Glucose-Capillary: 85 mg/dL (ref 70–99)

## 2023-07-10 LAB — PHOSPHORUS: Phosphorus: 3.9 mg/dL (ref 2.5–4.6)

## 2023-07-10 LAB — MAGNESIUM: Magnesium: 1.8 mg/dL (ref 1.7–2.4)

## 2023-07-10 MED ORDER — PREDNISONE 20 MG PO TABS
40.0000 mg | ORAL_TABLET | Freq: Every day | ORAL | Status: DC
  Administered 2023-07-11 – 2023-07-12 (×2): 40 mg via ORAL
  Filled 2023-07-10 (×2): qty 2

## 2023-07-10 MED ORDER — INSULIN ASPART 100 UNIT/ML IJ SOLN
0.0000 [IU] | Freq: Three times a day (TID) | INTRAMUSCULAR | Status: DC
  Administered 2023-07-11: 2 [IU] via SUBCUTANEOUS
  Administered 2023-07-12: 1 [IU] via SUBCUTANEOUS

## 2023-07-10 MED ORDER — INSULIN ASPART 100 UNIT/ML IJ SOLN: 0.0000 [IU] | Freq: Every day | INTRAMUSCULAR | Status: DC

## 2023-07-10 MED ORDER — ADULT MULTIVITAMIN W/MINERALS CH
1.0000 | ORAL_TABLET | Freq: Every day | ORAL | Status: DC
Start: 2023-07-11 — End: 2023-07-12
  Administered 2023-07-11 – 2023-07-12 (×2): 1 via ORAL
  Filled 2023-07-10 (×2): qty 1

## 2023-07-10 MED ORDER — ROSUVASTATIN CALCIUM 20 MG PO TABS
40.0000 mg | ORAL_TABLET | Freq: Every day | ORAL | Status: DC
  Administered 2023-07-10 – 2023-07-12 (×3): 40 mg via ORAL
  Filled 2023-07-10 (×3): qty 2

## 2023-07-10 MED ORDER — ASPIRIN 81 MG PO TBEC
81.0000 mg | DELAYED_RELEASE_TABLET | Freq: Every day | ORAL | Status: DC
  Administered 2023-07-10 – 2023-07-12 (×3): 81 mg via ORAL
  Filled 2023-07-10 (×3): qty 1

## 2023-07-10 MED ORDER — ADULT MULTIVITAMIN W/MINERALS CH
1.0000 | ORAL_TABLET | Freq: Every day | ORAL | Status: DC
  Filled 2023-07-10: qty 1

## 2023-07-10 MED ORDER — ENSURE ENLIVE PO LIQD
237.0000 mL | Freq: Three times a day (TID) | ORAL | Status: DC
  Administered 2023-07-10 – 2023-07-12 (×7): 237 mL via ORAL

## 2023-07-10 MED ORDER — ENOXAPARIN SODIUM 30 MG/0.3ML IJ SOSY
30.0000 mg | PREFILLED_SYRINGE | INTRAMUSCULAR | Status: DC
  Administered 2023-07-11 – 2023-07-12 (×2): 30 mg via SUBCUTANEOUS
  Filled 2023-07-10 (×2): qty 0.3

## 2023-07-10 MED ORDER — ALPRAZOLAM 0.5 MG PO TABS
0.2500 mg | ORAL_TABLET | Freq: Three times a day (TID) | ORAL | Status: DC | PRN
  Administered 2023-07-10: 0.25 mg via ORAL
  Filled 2023-07-10: qty 1

## 2023-07-10 NOTE — Progress Notes (Addendum)
 Nutrition Follow-up  DOCUMENTATION CODES:  Underweight, Severe malnutrition in context of chronic illness  INTERVENTION:  Liberalize to regular diet to promote good PO intake Ordering assistance Ensure Enlive po TID, each supplement provides 350 kcal and 20 grams of protein. MVI with minerals daily  NUTRITION DIAGNOSIS:  Severe Malnutrition related to chronic illness (CHF, COPD) as evidenced by severe muscle depletion, severe fat depletion. - remains applicable   GOAL:  Patient will meet greater than or equal to 90% of their needs - progressing, extubated, diet advanced  MONITOR:  PO intake, Supplement acceptance, Labs, Weight trends  REASON FOR ASSESSMENT:  Consult Enteral/tube feeding initiation and management  ASSESSMENT:  Pt with hx of COPD, GERD, and CHF presented to ED after he was found altered and unresponsive. Intubated in ED.  1/5 - admitted, intubated  1/7 - extubated  Pt resting in bed at the time of assessment. Extubated yesterday and awake and alert today. Pt reports that he feels his appetite is pretty good considering. States he is not eating at baseline yet but that prior to admission he was doing well. Reports usually consuming 2-3 meals per day.   Discussed with pt his need to regain some of the weight which has been lost over the last few months. Encouraged use of nutrition supplements in addition to 3 meals a day. Pt does receive [employer] benefits and encouraged him to ask to meet with RD at his clinic to possible receive nutrition supplements.  Pt agreeable to receiving while admitted, prefers butter pecan but is ok with vanilla.   Admit weight: 50.3 kg  Current weight: 48.1 kg  13.4% weight loss noted over the last 6 months prior to admission which if accurate is severe   Intake/Output Summary (Last 24 hours) at 07/10/2023 1307 Last data filed at 07/10/2023 1200 Gross per 24 hour  Intake 943.84 ml  Output 2470 ml  Net -1526.16 ml  Net IO Since Admission:  421.67 mL [07/10/23 1307]  Drains/Lines: UOP x 24 hours  Nutritionally Relevant Medications: Scheduled Meds:  docusate sodium   100 mg Oral BID   famotidine   20 mg Oral BID   insulin  aspart  0-9 Units Subcutaneous Q4H   methylPREDNISolone   40 mg Intravenous Q12H   polyethylene glycol  17 g Oral Daily   senna  2 tablet Oral QHS   thiamine   100 mg Oral Daily   Continuous Infusions:  cefTRIAXone  (ROCEPHIN )  IV Stopped (07/09/23 1635)   dexmedetomidine  (PRECEDEX ) IV infusion 0.8 mcg/kg/hr (07/10/23 0700)   doxycycline  (VIBRAMYCIN ) IV 125 mL/hr at 07/10/23 0700   feeding supplement (OSMOLITE 1.5 CAL) 55 mL/hr at 07/09/23 1600   PRN Meds: ondansetron   Labs Reviewed: Phosphorus 3.9 Mg 1.8 CBG ranges from 90-135 mg/dL over the last 24 hours HgbA1c 5.6% (03/08/20)  NUTRITION - FOCUSED PHYSICAL EXAM: Flowsheet Row Most Recent Value  Orbital Region Severe depletion  Upper Arm Region Severe depletion  Thoracic and Lumbar Region Severe depletion  Buccal Region Severe depletion  Temple Region Severe depletion  Clavicle Bone Region Severe depletion  Clavicle and Acromion Bone Region Severe depletion  Scapular Bone Region Severe depletion  Dorsal Hand Severe depletion  Patellar Region Severe depletion  Anterior Thigh Region Severe depletion  Posterior Calf Region Severe depletion  Edema (RD Assessment) None  Hair Reviewed  Eyes Reviewed  Mouth Unable to assess  [ETT and bite block]  Skin Reviewed  Nails Unable to assess  [mittens]    Diet Order:   Diet Order  Diet regular Room service appropriate? Yes with Assist; Fluid consistency: Thin  Diet effective now                   EDUCATION NEEDS:  Not appropriate for education at this time  Skin:  Skin Assessment: Reviewed RN Assessment  Last BM:  prior to admission  Height:  Ht Readings from Last 1 Encounters:  07/08/23 6' (1.829 m)    Weight:  Wt Readings from Last 1 Encounters:  07/10/23 48.1  kg    Ideal Body Weight:  80.9 kg  BMI:  Body mass index is 14.38 kg/m.  Estimated Nutritional Needs:  Kcal:  1700-2000 kcal/d Protein:  80-100g/d Fluid:  1.7-2L/d    Anthony Nelson, RD, LDN Registered Dietitian II Please reach out via secure chat Weekend on-call pager # available in [facility]

## 2023-07-10 NOTE — Progress Notes (Signed)
  Progress Note   Date: 07/09/2023  Patient Name: Anthony Nelson        MRN#: [id number]   Clarification of diagnosis:  Metabolic encephalopathy due to hypercarbia

## 2023-07-10 NOTE — Progress Notes (Signed)
  Progress Note   Date: 07/09/2023  Patient Name: Anthony Nelson        MRN#: [id number]  Clarification of diagnosis:  Severe malnutrition     Based on BMI

## 2023-07-10 NOTE — TOC Progression Note (Signed)
 Transition of Care [facility]) - Progression Note    Patient Details  Name: Anthony Nelson MRN: [id number] Date of Birth: [birth year]  Transition of Care [facility]) CM/SW Contact  Lauraine FORBES Saa, LCSW Phone Number: 07/10/2023, 11:55 AM  Clinical Narrative:     11:55 AM Bonni LIEN housing case worker, [name], called CSW regarding update on discharge plans. CSW informed [name] that PT/OT is to be consulted as bedside RN stated that patient may be too weak to discharge home without services (home health or SNF). CSW also informed [name] that patient's family has been involved with patient's care. [name] stated that patient's daughter, Laurena, made a PCP appointment on behalf of patient (February 2025, Dr. Louann Righter of [facility] [phone] ext. [phone]). CSW relayed to [facility] inpatient dietitian's interest in patient being followed by outpatient dietitian at [facility]. [name] informed CSW that outpatient dietitian appointment can be made at PCP appointment. CSW relayed this information to inpatient dietitian. [name] provided CSW with patient's new address [phone], [place], [place] [address]) and stated that apartment would be available for patient to discharge to.    Barriers to Discharge: Continued Medical Work up  Expected Discharge Plan and Services In-house Referral: Clinical Social Work     Living arrangements for the past 2 months: Hotel/Motel                                       Social Determinants of Health (SDOH) Interventions SDOH Screenings   Food Insecurity: No Food Insecurity (04/28/2023)  Recent Concern: Food Insecurity - Food Insecurity Present (04/10/2023)  Housing: Low Risk  (04/28/2023)  Recent Concern: Housing - Medium Risk (04/10/2023)  Transportation Needs: No Transportation Needs (04/29/2023)  Recent Concern: Transportation Needs - Unmet Transportation Needs (04/28/2023)  Utilities: Not At Risk (04/28/2023)  Alcohol Screen: Low Risk   (12/26/2022)  Depression (PHQ2-9): Low Risk  (01/16/2023)  Physical Activity: Insufficiently Active (12/26/2022)  Social Connections: Moderately Isolated (12/26/2022)  Stress: No Stress Concern Present (12/26/2022)  Tobacco Use: Medium Risk (04/28/2023)    Readmission Risk Interventions     No data to display

## 2023-07-10 NOTE — Progress Notes (Signed)
 Progress Note   Patient: Anthony Nelson FMW:[id number] DOB: 25-May-1961 DOA: 07/07/2023     3 DOS: the patient was seen and examined on 07/10/2023   Brief hospital course: Bradd Merlos is a 63 yo male with past medical history significant for COPD, chronic hypoxia on 2L 02, pulmonary hypertension, GERD, diastolic CHF and other comorbidities who presented as unresponsive, intubated in ED. patient is admitted to ICU under PCCM service for acute on chronic hypoxic and hypercapnic respiratory failure.  He is on and off agitated during the weaning trial.  Patient is extubated 07/09/2023 transferred to TRH service.  Assessment and Plan: Acute on chronic hypoxic hypercapnic respiratory failure. COPD exacerbation S/p extubation. Continue antibiotics doxycycline . Continue to supplement oxygen  as needed for saturation greater than 90%. Patient will be continued on bronchodilator therapy. Continue Brovana  Yupelri . He will be transferred out of unit to medical telemetry floor.  Acute metabolic encephalopathy secondary to hypercapnia. Mental status much better.  He is back to baseline. Low-dose Xanax  as needed for anxiety. Patient is able to regular diet. Home oral medications will be restarted.  Chronic diastolic CHF. History of pulmonary hypertension. Continue current home medications.  Severe protein calorie malnutrition. BMI 14.38 Encourage oral diet, supplements. Continue thiamine , multivitamin. Dietitian evaluation.  GERD Continue PPI.     Out of bed to chair. Incentive spirometry. Nursing supportive care. Fall, aspiration precautions. DVT prophylaxis   Code Status: Full Code  Subjective: Patient is seen and examined today morning in ICU.  Patient is extubated, able to tolerate diet, currently on 2-3 supplemental oxygen .  Per RN he is on and off agitated.  Patient is asking for anxiolytics.  Physical Exam: Vitals:   07/10/23 1200 07/10/23 1421 07/10/23 1500 07/10/23 1541  BP:  111/72  134/76   Pulse: 80 (!) 112 (!) 128   Resp: 18     Temp:    99.5 F (37.5 C)  TempSrc:    Oral  SpO2: 94%     Weight:      Height:        General - Elderly cachectic African-American male, no apparent distress HEENT - PERRLA, EOMI, atraumatic head, non tender sinuses. Lung - Clear, diffuse rhonchi, wheezes. Heart - S1, S2 heard, no murmurs, rubs, no pedal edema. Abdomen - Soft, non tender, nondistended, bowel sounds good Neuro - Alert, awake and oriented x 3, non focal exam. Skin - Warm and dry.  Data Reviewed:      Latest Ref Rng & Units 07/10/2023    5:56 AM 07/08/2023    2:34 AM 07/07/2023    8:46 PM  CBC  WBC 4.0 - 10.5 K/uL 15.3  7.9    Hemoglobin 13.0 - 17.0 g/dL 88.2  89.0  87.3   Hematocrit 39.0 - 52.0 % 36.3  35.9  37.0   Platelets 150 - 400 K/uL 116  116        Latest Ref Rng & Units 07/10/2023    5:56 AM 07/08/2023    2:34 AM 07/07/2023    8:46 PM  BMP  Glucose 70 - 99 mg/dL 886  873    BUN 8 - 23 mg/dL 12  26    Creatinine 9.38 - 1.24 mg/dL 9.50  9.28    Sodium 864 - 145 mmol/L 133  134  135   Potassium 3.5 - 5.1 mmol/L 4.1  4.3  3.9   Chloride 98 - 111 mmol/L 84  85    CO2 22 - 32 mmol/L 37  35    Calcium  8.9 - 10.3 mg/dL 9.0  8.5     No results found.   Family Communication: Discussed with patient, he understand and agree. All questions answereed.    Disposition: Status is: Inpatient Remains inpatient appropriate because: post extubation, moved out of ICU  Planned Discharge Destination: Home with Home Health     Time spent: 42 minutes  Author: Concepcion Riser, MD 07/10/2023 5:02 PM Secure chat 7am to 7pm For on call review [email or web address].

## 2023-07-11 DIAGNOSIS — J9621 Acute and chronic respiratory failure with hypoxia: Secondary | ICD-10-CM

## 2023-07-11 LAB — BASIC METABOLIC PANEL
Anion gap: 9 (ref 5–15)
BUN: 7 mg/dL — ABNORMAL LOW (ref 8–23)
CO2: 39 mmol/L — ABNORMAL HIGH (ref 22–32)
Calcium: 8.6 mg/dL — ABNORMAL LOW (ref 8.9–10.3)
Chloride: 84 mmol/L — ABNORMAL LOW (ref 98–111)
Creatinine, Ser: 0.54 mg/dL — ABNORMAL LOW (ref 0.61–1.24)
GFR, Estimated: >60 mL/min (ref >60)
Glucose, Bld: 111 mg/dL — ABNORMAL HIGH (ref 70–99)
Potassium: 3.5 mmol/L (ref 3.5–5.1)
Sodium: 132 mmol/L — ABNORMAL LOW (ref 135–145)

## 2023-07-11 LAB — CBC
HCT: 36.8 % — ABNORMAL LOW (ref 39.0–52.0)
Hemoglobin: 11.9 g/dL — ABNORMAL LOW (ref 13.0–17.0)
MCH: 31.2 pg (ref 26.0–34.0)
MCHC: 32.3 g/dL (ref 30.0–36.0)
MCV: 96.6 fL (ref 80.0–100.0)
Platelets: 160 10*3/uL (ref 150–400)
RBC: 3.81 MIL/uL — ABNORMAL LOW (ref 4.22–5.81)
RDW: 12.5 % (ref 11.5–15.5)
WBC: 23.8 10*3/uL — ABNORMAL HIGH (ref 4.0–10.5)
nRBC: 0 % (ref 0.0–0.2)

## 2023-07-11 LAB — GLUCOSE, CAPILLARY
Glucose-Capillary: 127 mg/dL — ABNORMAL HIGH (ref 70–99)
Glucose-Capillary: 127 mg/dL — ABNORMAL HIGH (ref 70–99)
Glucose-Capillary: 207 mg/dL — ABNORMAL HIGH (ref 70–99)
Glucose-Capillary: 139 mg/dL — ABNORMAL HIGH (ref 70–99)

## 2023-07-11 NOTE — Plan of Care (Signed)
  Problem: Nutritional: Goal: Maintenance of adequate nutrition will improve Outcome: Progressing   Problem: Education: Goal: Knowledge of General Education information will improve Description: Including pain rating scale, medication(s)/side effects and non-pharmacologic comfort measures Outcome: Progressing   Problem: Pain Management: Goal: General experience of comfort will improve Outcome: Progressing

## 2023-07-11 NOTE — Progress Notes (Signed)
 Mobility Specialist: Progress Note   07/11/23 1222  Mobility  Activity Ambulated with assistance in hallway  Level of Assistance Standby assist, set-up cues, supervision of patient - no hands on  Assistive Device Four wheel walker  Distance Ambulated (ft) 40 ft  Activity Response Tolerated fair  Mobility Referral Yes  Mobility visit 1 Mobility  Mobility Specialist Start Time (ACUTE ONLY) [id number]  Mobility Specialist Stop Time (ACUTE ONLY) 0958  Mobility Specialist Time Calculation (min) (ACUTE ONLY) 20 min    Pre Mobility: SpO2 93% 2LO2 During Mobility: SpO2 75-78% 2LO2, 91-92% 2LO2 (after recovery), 95% 3LO2  Post Mobility: SpO2 91-92% 2LO2  Pt was agreeable to mobility session - received in bed, SpO2 WFL on 2LO2. Sv throughout. SpO2 dropped to 75-78% 2LO2 2x throughout ambulation. Each time, the pt took a seated break (~2 min); and was titrated to 3LO2 to recover; SpO2 recovered to 94-95% 3LO2 and was dropped back down to 2LO2. Left in bed on 2LO2 maintaining SpO2 91-92% with all needs met, call bell in reach.   Anthony Nelson Mobility Specialist Please contact via SecureChat or Rehab office at [phone]

## 2023-07-11 NOTE — Progress Notes (Signed)
 PROGRESS NOTE  Anthony Nelson  FMW:[id number] DOB: July 16, 1960 DOA: 07/07/2023 PCP: Brien Belvie BRAVO, MD   Brief Narrative: Patient is a 63 year old male with history of COPD, chronic hypoxic respiratory failure on 2 L of oxygen  at baseline, pulmonary hypertension, GERD, diastolic CHF, homeless ? who presented with here after he was found by motel  staff found him unresponsive when he failed to check out.  On presentation, he had to be intubated for severe hypoxic/hypercarbic respiratory failure.  Patient was managed under ICU service for acute on chronic COPD exacerbation.  Extubated and transferred to TRH service on 1/8.  Currently on 3 L of oxygen  per minute.  Trying to wean.  PT will be consulted today.  Assessment & Plan:  Principal Problem:   Respiratory failure with hypoxia (HCC)  Acute on chronic hypoxic/hypercarbic respiratory failure: Had to be intubated on presentation.  On 2 L oxygen  at baseline.  Currently on same.  Acute COPD exacerbation: Started on antibiotics, steroids.  Continue Brovana , Yupelri , bronchodilator treatment.  He follows with pulmonology as an outpatient.  Past smoker  Leukocytosis: This is most likely from steroid.  Continue to monitor.  No signs of infectious process  Acute metabolic encephalopathy: In the setting of hypercapnia.  Currently back to baseline.  Low-dose Xanax  for anxiety.  Chronic diastolic CHF/pulmonary hypertension: Continue current medications.  Currently euvolemic  GERD: Continue PPI  Severe protein calorie monitor: BMI of just 14.  Continue dietary supplements, multivitamins.  Dietitian consulted and following  Homelessness: TOC following.Possible discharge to an apartment.    Nutrition Problem: Severe Malnutrition Etiology: chronic illness (CHF, COPD)    DVT prophylaxis:enoxaparin  (LOVENOX ) injection 30 mg Start: 07/11/23 1000 SCDs Start: 07/07/23 1619     Code Status: Full Code  Family Communication: None at  bedside  Patient status:Inpatient  Patient is from :home  Anticipated discharge un:ynfz  Estimated DC date:tomorrow   Consultants: PCCM  Procedures:Intubation  Antimicrobials:  Anti-infectives (From admission, onward)    Start     Dose/Rate Route Frequency Ordered Stop   07/07/23 1700  doxycycline  (VIBRAMYCIN ) 100 mg in dextrose  5 % 250 mL IVPB        100 mg 125 mL/hr over 120 Minutes Intravenous Every 12 hours 07/07/23 1646 07/12/23 1759   07/07/23 1630  azithromycin  (ZITHROMAX ) 500 mg in sodium chloride  0.9 % 250 mL IVPB  Status:  Discontinued        500 mg 250 mL/hr over 60 Minutes Intravenous Every 24 hours 07/07/23 1627 07/07/23 1646   07/07/23 1630  cefTRIAXone  (ROCEPHIN ) 1 g in sodium chloride  0.9 % 100 mL IVPB        1 g 200 mL/hr over 30 Minutes Intravenous Every 24 hours 07/07/23 1627 07/13/23 1559       Subjective: Patient seen and examined at bedside today.  Hemodynamically stable.  On 3 L of oxygen .  However looks comfortable.  Complains of some tightness of breathing but not wheezing.  Lungs are almost clear on auscultation.  Using some accessory muscles of respiration.  Became fatigued while ambulating with the mobility tech.  Consulting PT.  Objective: Vitals:   07/10/23 1958 07/10/23 2023 07/11/23 0059 07/11/23 0509  BP:  98/62 114/72 107/61  Pulse:  100 (!) 110   Resp:  20 17   Temp: 99.6 F (37.6 C) 98.6 F (37 C) 98.3 F (36.8 C) 98.8 F (37.1 C)  TempSrc: Oral Oral Oral   SpO2:  95% 95% 97%  Weight:  Height:        Intake/Output Summary (Last 24 hours) at 07/11/2023 0755 Last data filed at 07/11/2023 0549 Gross per 24 hour  Intake 705.52 ml  Output 2950 ml  Net -2244.48 ml   Filed Weights   07/08/23 0153 07/09/23 0500 07/10/23 0500  Weight: 50.3 kg 51.1 kg 48.1 kg    Examination:  General exam: Overall comfortable, not in distress, very weak and deconditioned,mal nourished HEENT: PERRL Respiratory system: Diminished sounds  bilaterally no wheezes or crackles  Cardiovascular system: S1 & S2 heard, RRR.  Gastrointestinal system: Abdomen is nondistended, soft and nontender. Central nervous system: Alert and oriented Extremities: No edema, no clubbing ,no cyanosis Skin: No rashes, no ulcers,no icterus     Data Reviewed: I have personally reviewed following labs and imaging studies  CBC: Recent Labs  Lab 07/07/23 1450 07/07/23 1552 07/07/23 1608 07/07/23 2046 07/08/23 0234 07/10/23 0556 07/11/23 0437  WBC 7.0  --   --   --  7.9 15.3* 23.8*  NEUTROABS 6.1  --   --   --   --   --   --   HGB 12.8*   < > 15.6 12.6* 10.9* 11.7* 11.9*  HCT 42.9   < > 46.0 37.0* 35.9* 36.3* 36.8*  MCV 104.6*  --   --   --  102.3* 96.5 96.6  PLT 138*  --   --   --  116* 116* 160   < > = values in this interval not displayed.   Basic Metabolic Panel: Recent Labs  Lab 07/07/23 1450 07/07/23 1552 07/07/23 1608 07/07/23 2046 07/08/23 0234 07/08/23 0234 07/08/23 1355 07/08/23 1843 07/09/23 0711 07/09/23 1711 07/10/23 0556 07/10/23 0557 07/11/23 0437  NA 138   < > 135 135 134*  --   --   --   --   --  133*  --  132*  K 4.7   < > 4.4 3.9 4.3  --   --   --   --   --  4.1  --  3.5  CL 77*  --   --   --  85*  --   --   --   --   --  84*  --  84*  CO2 >45*  --   --   --  35*  --   --   --   --   --  37*  --  39*  GLUCOSE 124*  --   --   --  126*  --   --   --   --   --  113*  --  111*  BUN 29*  --   --   --  26*  --   --   --   --   --  12  --  7*  CREATININE 0.53*  --   --   --  0.71  --   --   --   --   --  0.49*  --  0.54*  CALCIUM  8.8*  --   --   --  8.5*  --   --   --   --   --  9.0  --  8.6*  MG  --   --   --   --  2.1  --   --  1.8 1.7 1.8  --  1.8  --   PHOS  --   --   --   --  <1.0*   < >  2.8 2.3* 3.0 4.2  --  3.9  --    < > = values in this interval not displayed.     Recent Results (from the past 240 hours)  Resp panel by RT-PCR (RSV, Flu A&B, Covid) Anterior Nasal Swab     Status: None   Collection Time:  07/07/23  2:24 PM   Specimen: Anterior Nasal Swab  Result Value Ref Range Status   SARS Coronavirus 2 by RT PCR NEGATIVE NEGATIVE Final   Influenza A by PCR NEGATIVE NEGATIVE Final   Influenza B by PCR NEGATIVE NEGATIVE Final    Comment: (NOTE) The Xpert Xpress SARS-CoV-2/FLU/RSV plus assay is intended as an aid in the diagnosis of influenza from Nasopharyngeal swab specimens and should not be used as a sole basis for treatment. Nasal washings and aspirates are unacceptable for Xpert Xpress SARS-CoV-2/FLU/RSV testing.  Fact Sheet for Patients: bloggercourse.com  Fact Sheet for Healthcare Providers: seriousbroker.it  This test is not yet approved or cleared by the [place]  FDA and has been authorized for detection and/or diagnosis of SARS-CoV-2 by FDA under an Emergency Use Authorization (EUA). This EUA will remain in effect (meaning this test can be used) for the duration of the COVID-19 declaration under Section 564(b)(1) of the Act, 21 U.S.C. section 360bbb-3(b)(1), unless the authorization is terminated or revoked.     Resp Syncytial Virus by PCR NEGATIVE NEGATIVE Final    Comment: (NOTE) Fact Sheet for Patients: bloggercourse.com  Fact Sheet for Healthcare Providers: seriousbroker.it  This test is not yet approved or cleared by the [place]  FDA and has been authorized for detection and/or diagnosis of SARS-CoV-2 by FDA under an Emergency Use Authorization (EUA). This EUA will remain in effect (meaning this test can be used) for the duration of the COVID-19 declaration under Section 564(b)(1) of the Act, 21 U.S.C. section 360bbb-3(b)(1), unless the authorization is terminated or revoked.  Performed at [facility], [address] [place], [place] [address]   MRSA Next Gen by PCR, Nasal     Status: None   Collection Time: 07/07/23  6:00 PM   Specimen:  Nasal Mucosa; Nasal Swab  Result Value Ref Range Status   MRSA by PCR Next Gen NOT DETECTED NOT DETECTED Final    Comment: (NOTE) The GeneXpert MRSA Assay (FDA approved for NASAL specimens only), is one component of a comprehensive MRSA colonization surveillance program. It is not intended to diagnose MRSA infection nor to guide or monitor treatment for MRSA infections. Test performance is not FDA approved in patients less than 37 years old. Performed at [facility], [address] [place], [place] [address]      Radiology Studies: No results found.  Scheduled Meds:  arformoterol   15 mcg Nebulization BID   aspirin  EC  81 mg Oral Daily   Chlorhexidine  Gluconate Cloth  6 each Topical Daily   docusate sodium   100 mg Oral BID   enoxaparin  (LOVENOX ) injection  30 mg Subcutaneous Q24H   famotidine   20 mg Oral BID   feeding supplement  237 mL Oral TID BM   insulin  aspart  0-5 Units Subcutaneous QHS   insulin  aspart  0-6 Units Subcutaneous TID WC   multivitamin with minerals  1 tablet Oral Daily   polyethylene glycol  17 g Oral Daily   predniSONE   40 mg Oral Q breakfast   revefenacin   175 mcg Nebulization Daily   rosuvastatin   40 mg Oral Daily   senna  2  tablet Oral QHS   thiamine   100 mg Oral Daily   Continuous Infusions:  cefTRIAXone  (ROCEPHIN )  IV 1 g (07/10/23 1631)   dexmedetomidine  (PRECEDEX ) IV infusion Stopped (07/10/23 0954)   doxycycline  (VIBRAMYCIN ) IV Stopped (07/11/23 0744)     LOS: 4 days   Ivonne Mustache, MD [facility] P1/03/2024, 7:55 AM

## 2023-07-12 ENCOUNTER — Other Ambulatory Visit (HOSPITAL_COMMUNITY): Payer: Self-pay

## 2023-07-12 DIAGNOSIS — J9621 Acute and chronic respiratory failure with hypoxia: Secondary | ICD-10-CM | POA: Diagnosis not present

## 2023-07-12 LAB — CBC
HCT: 37.2 % — ABNORMAL LOW (ref 39.0–52.0)
Hemoglobin: 11.8 g/dL — ABNORMAL LOW (ref 13.0–17.0)
MCH: 31.3 pg (ref 26.0–34.0)
MCHC: 31.7 g/dL (ref 30.0–36.0)
MCV: 98.7 fL (ref 80.0–100.0)
Platelets: 193 10*3/uL (ref 150–400)
RBC: 3.77 MIL/uL — ABNORMAL LOW (ref 4.22–5.81)
RDW: 12.6 % (ref 11.5–15.5)
WBC: 14.6 10*3/uL — ABNORMAL HIGH (ref 4.0–10.5)
nRBC: 0 % (ref 0.0–0.2)

## 2023-07-12 LAB — GLUCOSE, CAPILLARY
Glucose-Capillary: 180 mg/dL — ABNORMAL HIGH (ref 70–99)
Glucose-Capillary: 102 mg/dL — ABNORMAL HIGH (ref 70–99)

## 2023-07-12 MED ORDER — IPRATROPIUM-ALBUTEROL 0.5-2.5 (3) MG/3ML IN SOLN
3.0000 mL | RESPIRATORY_TRACT | 1 refills | Status: DC | PRN
  Filled 2023-07-12: qty 360, 10d supply, fill #0
  Filled 2023-07-29: qty 360, 10d supply, fill #1
  Filled 2023-07-29: qty 360, 10d supply, fill #0

## 2023-07-12 MED ORDER — ASPIRIN 81 MG PO TBEC
81.0000 mg | DELAYED_RELEASE_TABLET | Freq: Every day | ORAL | 0 refills | Status: AC
  Filled 2023-07-12: qty 60, 60d supply, fill #0

## 2023-07-12 MED ORDER — GUAIFENESIN ER 600 MG PO TB12
600.0000 mg | ORAL_TABLET | Freq: Two times a day (BID) | ORAL | 0 refills | Status: DC | PRN
  Filled 2023-07-12: qty 28, 14d supply, fill #0

## 2023-07-12 MED ORDER — PREDNISONE 20 MG PO TABS
40.0000 mg | ORAL_TABLET | Freq: Every day | ORAL | 0 refills | Status: AC
  Filled 2023-07-12: qty 2, 1d supply, fill #0

## 2023-07-12 MED ORDER — TRELEGY ELLIPTA 200-62.5-25 MCG/ACT IN AEPB
1.0000 | INHALATION_SPRAY | Freq: Every day | RESPIRATORY_TRACT | 1 refills | Status: AC
  Filled 2023-07-12 – 2023-07-29 (×3): qty 60, 30d supply, fill #0
  Filled 2023-09-23: qty 60, 30d supply, fill #1

## 2023-07-12 MED ORDER — ALBUTEROL SULFATE HFA 108 (90 BASE) MCG/ACT IN AERS
2.0000 | INHALATION_SPRAY | Freq: Four times a day (QID) | RESPIRATORY_TRACT | 2 refills | Status: DC | PRN
  Filled 2023-07-12 – 2023-07-29 (×3): qty 18, 25d supply, fill #0

## 2023-07-12 MED ORDER — ALPRAZOLAM 0.25 MG PO TABS
0.2500 mg | ORAL_TABLET | Freq: Three times a day (TID) | ORAL | 0 refills | Status: DC | PRN
  Filled 2023-07-12: qty 15, 5d supply, fill #0

## 2023-07-12 NOTE — Consult Note (Signed)
 [facility] Liaison Consult Note   07/12/2023  Anthony Nelson [birth year] [id number]  [facility] Patient:  Newly assigned to Community RN CC  Primary Care Provider:  Brien Belvie BRAVO, MD, with [facility], this provider is listed to provide the community transition of care follow up and [facility] calls  Insurance: Mylene Many, [place] [facility]  Patient is currently active with [facility] for care coordination services.  Patient has been engaged by a [employer].   The community based plan of care has focused on disease management and community resource support.    [facility] Liaison met with patient at the bedside at [facility].  Patient endorses [facility]CP and [facility] Explained reason for rounding visit and have noted follow up with with [facility] Community TOC RN. Patient was agreeable and accepting.  Patient will receive a post hospital call and will be evaluated for assessments and disease process education.    Plan: Continue to follow for any additional community care coordination needs for post hospital/community needs.   Of note, [facility] services does not replace or interfere with any services that are needed or arranged by inpatient [facility] care management team.   Richerd Fish, RN, BSN, CCM [place]  [facility], [facility] Liaison Direct Dial: [phone] or secure chat Email: [email or web address]

## 2023-07-12 NOTE — Evaluation (Signed)
 Physical Therapy Evaluation Patient Details Name: Anthony Nelson MRN: [id number] DOB: 1961/01/12 Today's Date: 07/12/2023  History of Present Illness  The pt is a 63 yo male presenting 1/5 after being found unresponsive. Intubated on arrival, extubated 1/7. Work up revealed severe emphysema, acute metabolic encephalopathy due to hypercapnia. PMH includes: severe COPD on 2L O2, bronchitis, CHF, and pulmonary HTN.   Clinical Impression  Pt in bed upon arrival of PT, agreeable to evaluation at this time. Prior to admission the pt was ambulating with use of his rollator, reports limited activity, use of 2L O2 at home. He reports having a concentrator set up in his apt, as well as additional tanks of O2. The pt was able to complete bed mobility without assistance, but is dependent on rollator for sit-stand transfers and hallway ambulation. He required 2L O2 at rest to maintain SpO2 >90%, but after 45 ft SpO2 was 83% on 2L with HR 127bpm. Pt then returned to room with 4L O2 and SpO2 88%. Recovered to high 90s with seated rest. Pt will continue to benefit from skilled PT acutely and after d/c to improve stability and endurance, but is safe to return home with intermittent support and assist.         If plan is discharge home, recommend the following: A little help with walking and/or transfers;Assistance with cooking/housework;Assist for transportation   Can travel by private vehicle        Equipment Recommendations Rollator (4 wheels) (pt unsure if his is lost)  Recommendations for Other Services       Functional Status Assessment Patient has had a recent decline in their functional status and demonstrates the ability to make significant improvements in function in a reasonable and predictable amount of time.     Precautions / Restrictions Precautions Precautions: Fall Precaution Comments: watch O2, on 2L baseline Restrictions Weight Bearing Restrictions Per Provider Order: No       Mobility  Bed Mobility Overal bed mobility: Modified Independent             General bed mobility comments: no assistance, use of bed rails and increased time    Transfers Overall transfer level: Needs assistance Equipment used: Rollator (4 wheels) Transfers: Sit to/from Stand Sit to Stand: Supervision           General transfer comment: cues for safety, use of breaks    Ambulation/Gait Ambulation/Gait assistance: Contact guard assist Gait Distance (Feet): 45 Feet (+ 45) Assistive device: Rollator (4 wheels) Gait Pattern/deviations: Step-through pattern, Decreased stride length Gait velocity: decreased Gait velocity interpretation: <1.31 ft/sec, indicative of household ambulator   General Gait Details: trunk flexed, BUE support, HR to 127bpm and SpO2 to 83% on 2L, increased to 4L and pt decreased to 88%. stable but slow.     Balance Overall balance assessment: Needs assistance Sitting-balance support: No upper extremity supported Sitting balance-Leahy Scale: Fair     Standing balance support: Bilateral upper extremity supported, During functional activity Standing balance-Leahy Scale: Poor                               Pertinent Vitals/Pain Pain Assessment Pain Assessment: No/denies pain    Home Living Family/patient expects to be discharged to:: Private residence Living Arrangements: Alone Available Help at Discharge: Family;Available PRN/intermittently Type of Home: Apartment Home Access: Level entry       Home Layout: One level Home Equipment: Grab bars - tub/shower;Educational Psychologist (  4 wheels) Additional Comments: home concentrator of O2    Prior Function Prior Level of Function : Independent/Modified Independent             Mobility Comments: uses rollator for ambulation, reports no falls ADLs Comments: reports independence, but im getting help with it now pt states an aide is being set up for him.      Extremity/Trunk Assessment   Upper Extremity Assessment Upper Extremity Assessment: Generalized weakness    Lower Extremity Assessment Lower Extremity Assessment: Generalized weakness    Cervical / Trunk Assessment Cervical / Trunk Assessment: Kyphotic;Other exceptions Cervical / Trunk Exceptions: frail  Communication   Communication Communication: No apparent difficulties Cueing Techniques: Verbal cues  Cognition Arousal: Alert Behavior During Therapy: WFL for tasks assessed/performed, Flat affect Overall Cognitive Status: No family/caregiver present to determine baseline cognitive functioning                                          General Comments General comments (skin integrity, edema, etc.): SpO2 to low 80s on RA at rest, stable on 2L. Low of 83% on 2L after 45 ft, then    Exercises     Assessment/Plan    PT Assessment Patient needs continued PT services  PT Problem List Cardiopulmonary status limiting activity;Decreased strength;Decreased activity tolerance;Decreased balance;Decreased mobility       PT Treatment Interventions Gait training;DME instruction;Functional mobility training;Therapeutic activities;Therapeutic exercise;Balance training    PT Goals (Current goals can be found in the Care Plan section)  Acute Rehab PT Goals Patient Stated Goal: return home PT Goal Formulation: With patient Time For Goal Achievement: 07/26/23 Potential to Achieve Goals: Good    Frequency Min 1X/week        AM-PAC PT 6 Clicks Mobility  Outcome Measure Help needed turning from your back to your side while in a flat bed without using bedrails?: None Help needed moving from lying on your back to sitting on the side of a flat bed without using bedrails?: None Help needed moving to and from a bed to a chair (including a wheelchair)?: A Little Help needed standing up from a chair using your arms (e.g., wheelchair or bedside chair)?: A Little Help  needed to walk in hospital room?: A Little Help needed climbing 3-5 steps with a railing? : A Lot 6 Click Score: 19    End of Session Equipment Utilized During Treatment: Gait belt;Oxygen  Activity Tolerance: Patient tolerated treatment well;Patient limited by fatigue Patient left: in chair;with call bell/phone within reach;with chair alarm set Nurse Communication: Mobility status PT Visit Diagnosis: Unsteadiness on feet (R26.81)    Time: 8972-8891 PT Time Calculation (min) (ACUTE ONLY): 41 min   Charges:   PT Evaluation $PT Eval Moderate Complexity: 1 Mod PT Treatments $Gait Training: 8-22 mins $Therapeutic Activity: 8-22 mins PT General Charges $$ ACUTE PT VISIT: 1 Visit         Izetta Call, PT, DPT   Acute Rehabilitation Department Office [phone] Secure Chat Communication Preferred  Izetta JULIANNA Call 07/12/2023, 11:28 AM

## 2023-07-12 NOTE — Discharge Summary (Addendum)
 Physician Discharge Summary  Anthony Nelson FMW:[id number] DOB: 1961/05/24 DOA: 07/07/2023  PCP: Brien Belvie BRAVO, MD  Admit date: 07/07/2023 Discharge date: 07/12/2023  Admitted From: Home Disposition:  Home  Discharge Condition:Stable CODE STATUS:FULL Diet recommendation: Regular   Brief/Interim Summary: Patient is a 63 year old male with history of COPD, chronic hypoxic respiratory failure on 2 L of oxygen  at baseline, pulmonary hypertension, GERD, diastolic CHF, homeless ? who presented with here after he was found by motel  staff found him unresponsive when he failed to check out.  On presentation, he had to be intubated for severe hypoxic/hypercarbic respiratory failure.  Patient was managed under ICU service for acute on chronic COPD exacerbation.  Extubated and transferred to TRH service on 1/8.  Currently on 2 L of oxygen  per minute.  PT consulted.  Recommended home health.  Currently at baseline oxygen  at rest.  Medically stable for discharge .  Needs to follow-up with pulmonology as an outpatient.  Following problems were addressed during the hospitalization:  Acute on chronic hypoxic/hypercarbic respiratory failure: Had to be intubated on presentation.  On 2 L oxygen  at baseline.  Currently on same.   Acute COPD exacerbation: Started on antibiotics, steroids.  He follows with pulmonology as an outpatient.  Past smoker.  Referral provided for pulmonology follow-up.  Continue home inhaler, rescue albuterol  inhaler, DuoNeb   Leukocytosis: This is most likely from steroid.  Continue to monitor.  Improved   Acute metabolic encephalopathy: In the setting of hypercapnia.  Currently back to baseline.  Low-dose Xanax  for anxiety.   Chronic diastolic CHF/pulmonary hypertension: Continue current medications.  Currently euvolemic   GERD: Continue PPI   Severe protein calorie monitor: BMI of just 14.  Dietitian was consulted and following   Homelessness: TOC following.Plan for dc to an  apartment.     Discharge Diagnoses:  Principal Problem:   Respiratory failure with hypoxia [facility])    Discharge Instructions  Discharge Instructions     Ambulatory referral to Pulmonology   Complete by: As directed    Reason for referral: Other Comment - copd   Diet general   Complete by: As directed    Discharge instructions   Complete by: As directed    1)Please take your medications as instructed 2)Follow up with your pilmonologist   Increase activity slowly   Complete by: As directed       Allergies as of 07/12/2023       Reactions   Erythromycin Other (See Comments)   Allergy from childhood- does not take it        Medication List     TAKE these medications    albuterol  108 (90 Base) MCG/ACT inhaler Commonly known as: Ventolin  HFA Inhale 2 puffs into the lungs every 6 hours as needed for wheezing or shortness of breath. What changed: Another medication with the same name was removed. Continue taking this medication, and follow the directions you see here.   ALPRAZolam  0.25 MG tablet Commonly known as: XANAX  Take 1 tablet (0.25 mg total) by mouth 3 (three) times daily as needed for anxiety.   aspirin  EC 81 MG tablet Take 1 tablet (81 mg total) by mouth daily. Swallow whole.   docusate sodium  100 MG capsule Commonly known as: COLACE Take 1 capsule (100 mg total) by mouth 2 (two) times daily.   famotidine  20 MG tablet Commonly known as: PEPCID  Take 1 tablet (20 mg total) by mouth 2 (two) times daily.   furosemide  20 MG tablet Commonly  known as: LASIX  Take 1 tablet (20 mg total) by mouth daily as needed.   GINSENG PO Take 1 capsule by mouth daily.   guaiFENesin  600 MG 12 hr tablet Commonly known as: MUCINEX  Take 1 tablet (600 mg total) by mouth 2 (two) times daily as needed for cough or to loosen phlegm.   ipratropium-albuterol  0.5-2.5 (3) MG/3ML Soln Commonly known as: DUONEB Take 3 mLs by nebulization every 2 (two) hours as needed.    multivitamin with minerals tablet Take 1 tablet by mouth daily with breakfast.   OXYGEN  Inhale 2 L/min into the lungs continuous.   predniSONE  20 MG tablet Commonly known as: DELTASONE  Take 2 tablets (40 mg total) by mouth daily with breakfast for 1 dose. Start taking on: July 13, 2023   rosuvastatin  40 MG tablet Commonly known as: CRESTOR  Take 1 tablet (40 mg) by mouth daily.   sodium chloride  0.65 % Soln nasal spray Commonly known as: OCEAN Place 2 sprays into both nostrils in the morning, at noon, and at bedtime. What changed:  when to take this reasons to take this   Trelegy Ellipta  200-62.5-25 MCG/ACT Aepb Generic drug: Fluticasone -Umeclidin-Vilant Inhale 1 puff into the lungs daily.   VITAMIN E PO Take 1 capsule by mouth daily.               Durable Medical Equipment  (From admission, onward)           Start     Ordered   07/12/23 1129  For home use only DME Nebulizer machine  Once       Question Answer Comment  Patient needs a nebulizer to treat with the following condition COPD exacerbation (HCC)   Length of Need Lifetime   Additional equipment included Administration kit   Additional equipment included Filter      07/12/23 1128            Follow-up Information     Brien Belvie BRAVO, MD. Schedule an appointment as soon as possible for a visit in 1 week(s).   Specialty: Pulmonary Disease Contact information: [address] [place] [address] [phone]                Allergies  Allergen Reactions   Erythromycin Other (See Comments)    Allergy from childhood- does not take it    Consultations: Pulmonolgy   Procedures/Studies: DG Abd 1 View Result Date: 07/08/2023 CLINICAL DATA:  Check gastric catheter placement EXAM: ABDOMEN - 1 VIEW COMPARISON:  None Available. FINDINGS: Gastric catheter is noted within the stomach. No obstructive changes are seen. No free air is noted. IMPRESSION: Gastric catheter  within the stomach. Electronically Signed   By: Oneil Devonshire M.D.   On: 07/08/2023 18:22   DG Abd Portable 1V Result Date: 07/08/2023 CLINICAL DATA:  Tube placement EXAM: PORTABLE ABDOMEN - 1 VIEW limited for tube placement COMPARISON:  X-ray and CT 07/07/2023. FINDINGS: Limited x-ray for tube placement has a enteric tube with tip overlying the left upper quadrant in the expected location of the stomach however the side hole is at the GE junction region. Recommend further advancement into the stomach. Elsewhere in the upper abdomen there is gaseous distention of loops of colon. Overlapping cardiac leads. IMPRESSION: Enteric tube in place. Side hole at the level of the GE junction. This could be advanced further into the stomach Electronically Signed   By: Ranell Bring M.D.   On: 07/08/2023 13:27   CT Angio Chest  PE W and/or Wo Contrast Result Date: 07/07/2023 CLINICAL DATA:  Possible sepsis, respiratory distress EXAM: CT ANGIOGRAPHY CHEST CT ABDOMEN AND PELVIS WITH CONTRAST TECHNIQUE: Multidetector CT imaging of the chest was performed using the standard protocol during bolus administration of intravenous contrast. Multiplanar CT image reconstructions and MIPs were obtained to evaluate the vascular anatomy. Multidetector CT imaging of the abdomen and pelvis was performed using the standard protocol during bolus administration of intravenous contrast. RADIATION DOSE REDUCTION: This exam was performed according to the departmental dose-optimization program which includes automated exposure control, adjustment of the mA and/or kV according to patient size and/or use of iterative reconstruction technique. CONTRAST:  75mL OMNIPAQUE  IOHEXOL  350 MG/ML SOLN COMPARISON:  Chest x-ray from earlier in the same day. FINDINGS: CTA CHEST FINDINGS Cardiovascular: Thoracic aorta shows atherosclerotic calcifications. Due to the timing of the contrast bolus no information with regards to aortic dissection can not be made. No  aneurysmal dilatation is seen. Pulmonary artery shows a normal branching pattern. No intraluminal filling defect to suggest pulmonary embolus is noted. Mild coronary calcifications are noted. No cardiac enlargement is seen. Mediastinum/Nodes: Thoracic inlet is within normal limits. Endotracheal tube is noted in satisfactory position. Gastric catheter extends to the distal esophagus. This should be advanced several cm deeper into the stomach. No hilar or mediastinal adenopathy is noted. The esophagus as visualized is within normal limits. Lungs/Pleura: Lungs are hyperinflated with emphysematous changes. No focal infiltrate or sizable effusion is seen. Mild scarring is noted in the medial left lung base. Musculoskeletal: No acute bony abnormality is noted. Review of the MIP images confirms the above findings. CT ABDOMEN and PELVIS FINDINGS Hepatobiliary: No focal liver abnormality is seen. No gallstones, gallbladder wall thickening, or biliary dilatation. Pancreas: Unremarkable. No pancreatic ductal dilatation or surrounding inflammatory changes. Spleen: Normal in size without focal abnormality. Adrenals/Urinary Tract: Adrenal glands are within normal limits. Kidneys are well visualized bilaterally. No renal calculi or obstructive changes are seen. Delayed images demonstrate normal excretion of contrast. The bladder is decompressed by Foley catheter. Stomach/Bowel: Scattered fecal material is noted throughout the colon. No obstructive or inflammatory changes of the colon are noted. The appendix is air-filled and within normal limits. Small bowel and stomach are unremarkable. Vascular/Lymphatic: Aortic atherosclerosis. No enlarged abdominal or pelvic lymph nodes. Reproductive: Prostate is mildly prominent Other: No abdominal wall hernia or abnormality. No abdominopelvic ascites. Musculoskeletal: No acute or significant osseous findings. Review of the MIP images confirms the above findings. IMPRESSION: CT of the chest:  No evidence of pulmonary emboli. Nasogastric catheter is noted in the distal esophagus. This should be advanced several cm deeper into the stomach. Changes of COPD. CT of the abdomen and pelvis: No acute abnormality identified. Electronically Signed   By: Oneil Devonshire M.D.   On: 07/07/2023 18:13   CT ABDOMEN PELVIS W CONTRAST Result Date: 07/07/2023 CLINICAL DATA:  Possible sepsis, respiratory distress EXAM: CT ANGIOGRAPHY CHEST CT ABDOMEN AND PELVIS WITH CONTRAST TECHNIQUE: Multidetector CT imaging of the chest was performed using the standard protocol during bolus administration of intravenous contrast. Multiplanar CT image reconstructions and MIPs were obtained to evaluate the vascular anatomy. Multidetector CT imaging of the abdomen and pelvis was performed using the standard protocol during bolus administration of intravenous contrast. RADIATION DOSE REDUCTION: This exam was performed according to the departmental dose-optimization program which includes automated exposure control, adjustment of the mA and/or kV according to patient size and/or use of iterative reconstruction technique. CONTRAST:  75mL OMNIPAQUE  IOHEXOL  350 MG/ML  SOLN COMPARISON:  Chest x-ray from earlier in the same day. FINDINGS: CTA CHEST FINDINGS Cardiovascular: Thoracic aorta shows atherosclerotic calcifications. Due to the timing of the contrast bolus no information with regards to aortic dissection can not be made. No aneurysmal dilatation is seen. Pulmonary artery shows a normal branching pattern. No intraluminal filling defect to suggest pulmonary embolus is noted. Mild coronary calcifications are noted. No cardiac enlargement is seen. Mediastinum/Nodes: Thoracic inlet is within normal limits. Endotracheal tube is noted in satisfactory position. Gastric catheter extends to the distal esophagus. This should be advanced several cm deeper into the stomach. No hilar or mediastinal adenopathy is noted. The esophagus as visualized is  within normal limits. Lungs/Pleura: Lungs are hyperinflated with emphysematous changes. No focal infiltrate or sizable effusion is seen. Mild scarring is noted in the medial left lung base. Musculoskeletal: No acute bony abnormality is noted. Review of the MIP images confirms the above findings. CT ABDOMEN and PELVIS FINDINGS Hepatobiliary: No focal liver abnormality is seen. No gallstones, gallbladder wall thickening, or biliary dilatation. Pancreas: Unremarkable. No pancreatic ductal dilatation or surrounding inflammatory changes. Spleen: Normal in size without focal abnormality. Adrenals/Urinary Tract: Adrenal glands are within normal limits. Kidneys are well visualized bilaterally. No renal calculi or obstructive changes are seen. Delayed images demonstrate normal excretion of contrast. The bladder is decompressed by Foley catheter. Stomach/Bowel: Scattered fecal material is noted throughout the colon. No obstructive or inflammatory changes of the colon are noted. The appendix is air-filled and within normal limits. Small bowel and stomach are unremarkable. Vascular/Lymphatic: Aortic atherosclerosis. No enlarged abdominal or pelvic lymph nodes. Reproductive: Prostate is mildly prominent Other: No abdominal wall hernia or abnormality. No abdominopelvic ascites. Musculoskeletal: No acute or significant osseous findings. Review of the MIP images confirms the above findings. IMPRESSION: CT of the chest: No evidence of pulmonary emboli. Nasogastric catheter is noted in the distal esophagus. This should be advanced several cm deeper into the stomach. Changes of COPD. CT of the abdomen and pelvis: No acute abnormality identified. Electronically Signed   By: Oneil Devonshire M.D.   On: 07/07/2023 18:13   CT HEAD WO CONTRAST Result Date: 07/07/2023 CLINICAL DATA:  Neuro deficit, acute, stroke suspected EXAM: CT HEAD WITHOUT CONTRAST TECHNIQUE: Contiguous axial images were obtained from the base of the skull through the  vertex without intravenous contrast. RADIATION DOSE REDUCTION: This exam was performed according to the departmental dose-optimization program which includes automated exposure control, adjustment of the mA and/or kV according to patient size and/or use of iterative reconstruction technique. COMPARISON:  CT head 03/08/20 FINDINGS: Brain: No hemorrhage. No hydrocephalus. No extra-axial fluid collection. No CT evidence of an acute cortical infarct. No mass effect. No mass lesion. Vascular: No hyperdense vessel or unexpected calcification. Skull: Normal. Negative for fracture or focal lesion. Sinuses/Orbits: No middle ear or mastoid effusion. Paranasal sinuses are clear. Orbits are unremarkable. Other: None. IMPRESSION: No hemorrhage or CT evidence of an acute cortical infarct. Electronically Signed   By: Lyndall Gore M.D.   On: 07/07/2023 18:13   DG Abd Portable 1 View Result Date: 07/07/2023 CLINICAL DATA:  Evaluate OG tube placement. EXAM: PORTABLE ABDOMEN - 1 VIEW COMPARISON:  Chest radiograph from earlier today FINDINGS: Interval placement of enteric tube. The tip is just above the level of the GE junction. The side port is 6.7 cm above the GE junction. Visualized lungs are hyperinflated with diffuse coarsened interstitial markings. IMPRESSION: Interval placement of enteric tube. The tip is just above the level  of the GE junction. The side port is 6.7 cm above the GE junction. Recommend advancement of enteric tube. Electronically Signed   By: Waddell Calk M.D.   On: 07/07/2023 16:05   DG Chest Portable 1 View Result Date: 07/07/2023 CLINICAL DATA:  Respiratory distress EXAM: PORTABLE CHEST 1 VIEW COMPARISON:  Chest x-ray 05/02/2023 FINDINGS: Endotracheal tube tip is 6.3 cm above the carina. There some questionable pneumomediastinum at the level of the trachea. The lungs are hyperinflated, unchanged. There is no focal lung infiltrate, pleural effusion or pneumothorax. Cardiac silhouette appears within normal  limits. No acute fractures are seen. IMPRESSION: 1. Endotracheal tube tip is 6.3 cm above the carina. 2. Questionable pneumomediastinum at the level of the trachea. 3. No focal lung infiltrate. 4. Hyperinflated lungs compatible with emphysema. Emphysema (ICD10-J43.9). Electronically Signed   By: Greig Pique M.D.   On: 07/07/2023 15:16      Subjective: Patient seen and examined at bedside today.  Hemodynamically stable.  On 2 L of oxygen  at rest.  Not in any Respiratory distress.  He worked with the physical therapist.  Medically stable for discharge today.I called and discussed with daughter Mrs Corpening about dc plan  Discharge Exam: Vitals:   07/12/23 0917 07/12/23 0922  BP: 120/70   Pulse: 99 95  Resp: 18 17  Temp: 98.6 F (37 C) 98.4 F (36.9 C)  SpO2: 97% 98%   Vitals:   07/11/23 2059 07/12/23 0534 07/12/23 0917 07/12/23 0922  BP:  126/78 120/70   Pulse: (!) 103 98 99 95  Resp: 18 18 18 17   Temp:  98.3 F (36.8 C) 98.6 F (37 C) 98.4 F (36.9 C)  TempSrc:      SpO2: 98% 100% 97% 98%  Weight:      Height:        General: Pt is alert, awake, not in acute distress, very  deconditioned Cardiovascular: RRR, S1/S2 +, no rubs, no gallops Respiratory:  no wheezing, no rhonchi, diminished air sounds bilaterally Abdominal: Soft, NT, ND, bowel sounds + Extremities: no edema, no cyanosis    The results of significant diagnostics from this hospitalization (including imaging, microbiology, ancillary and laboratory) are listed below for reference.     Microbiology: Recent Results (from the past 240 hours)  Resp panel by RT-PCR (RSV, Flu A&B, Covid) Anterior Nasal Swab     Status: None   Collection Time: 07/07/23  2:24 PM   Specimen: Anterior Nasal Swab  Result Value Ref Range Status   SARS Coronavirus 2 by RT PCR NEGATIVE NEGATIVE Final   Influenza A by PCR NEGATIVE NEGATIVE Final   Influenza B by PCR NEGATIVE NEGATIVE Final    Comment: (NOTE) The Xpert Xpress  SARS-CoV-2/FLU/RSV plus assay is intended as an aid in the diagnosis of influenza from Nasopharyngeal swab specimens and should not be used as a sole basis for treatment. Nasal washings and aspirates are unacceptable for Xpert Xpress SARS-CoV-2/FLU/RSV testing.  Fact Sheet for Patients: bloggercourse.com  Fact Sheet for Healthcare Providers: seriousbroker.it  This test is not yet approved or cleared by the [place]  FDA and has been authorized for detection and/or diagnosis of SARS-CoV-2 by FDA under an Emergency Use Authorization (EUA). This EUA will remain in effect (meaning this test can be used) for the duration of the COVID-19 declaration under Section 564(b)(1) of the Act, 21 U.S.C. section 360bbb-3(b)(1), unless the authorization is terminated or revoked.     Resp Syncytial Virus by PCR NEGATIVE NEGATIVE Final  Comment: (NOTE) Fact Sheet for Patients: bloggercourse.com  Fact Sheet for Healthcare Providers: seriousbroker.it  This test is not yet approved or cleared by the [place]  FDA and has been authorized for detection and/or diagnosis of SARS-CoV-2 by FDA under an Emergency Use Authorization (EUA). This EUA will remain in effect (meaning this test can be used) for the duration of the COVID-19 declaration under Section 564(b)(1) of the Act, 21 U.S.C. section 360bbb-3(b)(1), unless the authorization is terminated or revoked.  Performed at [facility], [address], [place], [place] [address]   MRSA Next Gen by PCR, Nasal     Status: None   Collection Time: 07/07/23  6:00 PM   Specimen: Nasal Mucosa; Nasal Swab  Result Value Ref Range Status   MRSA by PCR Next Gen NOT DETECTED NOT DETECTED Final    Comment: (NOTE) The GeneXpert MRSA Assay (FDA approved for NASAL specimens only), is one component of a comprehensive MRSA colonization  surveillance program. It is not intended to diagnose MRSA infection nor to guide or monitor treatment for MRSA infections. Test performance is not FDA approved in patients less than 41 years old. Performed at [facility], [address] [place], [place] [address]      Labs: BNP (last 3 results) Recent Labs    04/28/23 1447  BNP 12.8   Basic Metabolic Panel: Recent Labs  Lab 07/07/23 1450 07/07/23 1552 07/07/23 1608 07/07/23 2046 07/08/23 0234 07/08/23 0234 07/08/23 1355 07/08/23 1843 07/09/23 0711 07/09/23 1711 07/10/23 0556 07/10/23 0557 07/11/23 0437  NA 138   < > 135 135 134*  --   --   --   --   --  133*  --  132*  K 4.7   < > 4.4 3.9 4.3  --   --   --   --   --  4.1  --  3.5  CL 77*  --   --   --  85*  --   --   --   --   --  84*  --  84*  CO2 >45*  --   --   --  35*  --   --   --   --   --  37*  --  39*  GLUCOSE 124*  --   --   --  126*  --   --   --   --   --  113*  --  111*  BUN 29*  --   --   --  26*  --   --   --   --   --  12  --  7*  CREATININE 0.53*  --   --   --  0.71  --   --   --   --   --  0.49*  --  0.54*  CALCIUM  8.8*  --   --   --  8.5*  --   --   --   --   --  9.0  --  8.6*  MG  --   --   --   --  2.1  --   --  1.8 1.7 1.8  --  1.8  --   PHOS  --   --   --   --  <1.0*   < > 2.8 2.3* 3.0 4.2  --  3.9  --    < > = values in this interval not displayed.   Liver  Function Tests: Recent Labs  Lab 07/07/23 1450  AST 30  ALT 20  ALKPHOS 39  BILITOT 1.2  PROT 7.1  ALBUMIN 3.9   No results for input(s): LIPASE, AMYLASE in the last 168 hours. No results for input(s): AMMONIA in the last 168 hours. CBC: Recent Labs  Lab 07/07/23 1450 07/07/23 1552 07/07/23 2046 07/08/23 0234 07/10/23 0556 07/11/23 0437 07/12/23 0704  WBC 7.0  --   --  7.9 15.3* 23.8* 14.6*  NEUTROABS 6.1  --   --   --   --   --   --   HGB 12.8*   < > 12.6* 10.9* 11.7* 11.9* 11.8*  HCT 42.9   < > 37.0* 35.9* 36.3* 36.8* 37.2*  MCV 104.6*  --   --  102.3*  96.5 96.6 98.7  PLT 138*  --   --  116* 116* 160 193   < > = values in this interval not displayed.   Cardiac Enzymes: No results for input(s): CKTOTAL, CKMB, CKMBINDEX, TROPONINI in the last 168 hours. BNP: Invalid input(s): POCBNP CBG: Recent Labs  Lab 07/11/23 1132 07/11/23 1657 07/11/23 2017 07/12/23 0755 07/12/23 1130  GLUCAP 127* 207* 139* 180* 102*   D-Dimer No results for input(s): DDIMER in the last 72 hours. Hgb A1c No results for input(s): HGBA1C in the last 72 hours. Lipid Profile No results for input(s): CHOL, HDL, LDLCALC, TRIG, CHOLHDL, LDLDIRECT in the last 72 hours. Thyroid  function studies No results for input(s): TSH, T4TOTAL, T3FREE, THYROIDAB in the last 72 hours.  Invalid input(s): FREET3 Anemia work up No results for input(s): VITAMINB12, FOLATE, FERRITIN, TIBC, IRON, RETICCTPCT in the last 72 hours. Urinalysis    Component Value Date/Time   COLORURINE YELLOW 07/07/2023 1424   APPEARANCEUR CLEAR 07/07/2023 1424   LABSPEC 1.016 07/07/2023 1424   PHURINE 6.0 07/07/2023 1424   GLUCOSEU NEGATIVE 07/07/2023 1424   HGBUR SMALL (A) 07/07/2023 1424   BILIRUBINUR NEGATIVE 07/07/2023 1424   KETONESUR 80 (A) 07/07/2023 1424   PROTEINUR 100 (A) 07/07/2023 1424   NITRITE NEGATIVE 07/07/2023 1424   LEUKOCYTESUR NEGATIVE 07/07/2023 1424   Sepsis Labs Recent Labs  Lab 07/08/23 0234 07/10/23 0556 07/11/23 0437 07/12/23 0704  WBC 7.9 15.3* 23.8* 14.6*   Microbiology Recent Results (from the past 240 hours)  Resp panel by RT-PCR (RSV, Flu A&B, Covid) Anterior Nasal Swab     Status: None   Collection Time: 07/07/23  2:24 PM   Specimen: Anterior Nasal Swab  Result Value Ref Range Status   SARS Coronavirus 2 by RT PCR NEGATIVE NEGATIVE Final   Influenza A by PCR NEGATIVE NEGATIVE Final   Influenza B by PCR NEGATIVE NEGATIVE Final    Comment: (NOTE) The Xpert Xpress SARS-CoV-2/FLU/RSV plus assay is  intended as an aid in the diagnosis of influenza from Nasopharyngeal swab specimens and should not be used as a sole basis for treatment. Nasal washings and aspirates are unacceptable for Xpert Xpress SARS-CoV-2/FLU/RSV testing.  Fact Sheet for Patients: bloggercourse.com  Fact Sheet for Healthcare Providers: seriousbroker.it  This test is not yet approved or cleared by the [place]  FDA and has been authorized for detection and/or diagnosis of SARS-CoV-2 by FDA under an Emergency Use Authorization (EUA). This EUA will remain in effect (meaning this test can be used) for the duration of the COVID-19 declaration under Section 564(b)(1) of the Act, 21 U.S.C. section 360bbb-3(b)(1), unless the authorization is terminated or revoked.     Resp Syncytial Virus by PCR  NEGATIVE NEGATIVE Final    Comment: (NOTE) Fact Sheet for Patients: bloggercourse.com  Fact Sheet for Healthcare Providers: seriousbroker.it  This test is not yet approved or cleared by the [place]  FDA and has been authorized for detection and/or diagnosis of SARS-CoV-2 by FDA under an Emergency Use Authorization (EUA). This EUA will remain in effect (meaning this test can be used) for the duration of the COVID-19 declaration under Section 564(b)(1) of the Act, 21 U.S.C. section 360bbb-3(b)(1), unless the authorization is terminated or revoked.  Performed at [facility], [address], [place], [place] [address]   MRSA Next Gen by PCR, Nasal     Status: None   Collection Time: 07/07/23  6:00 PM   Specimen: Nasal Mucosa; Nasal Swab  Result Value Ref Range Status   MRSA by PCR Next Gen NOT DETECTED NOT DETECTED Final    Comment: (NOTE) The GeneXpert MRSA Assay (FDA approved for NASAL specimens only), is one component of a comprehensive MRSA colonization surveillance program. It is not intended to  diagnose MRSA infection nor to guide or monitor treatment for MRSA infections. Test performance is not FDA approved in patients less than 5 years old. Performed at [facility], [address], [place], [place] [address]     Please note: You were cared for by a hospitalist during your hospital stay. Once you are discharged, your primary care physician will handle any further medical issues. Please note that NO REFILLS for any discharge medications will be authorized once you are discharged, as it is imperative that you return to your primary care physician (or establish a relationship with a primary care physician if you do not have one) for your post hospital discharge needs so that they can reassess your need for medications and monitor your lab values.    Time coordinating discharge: 40 minutes  SIGNED:   Ivonne Mustache, MD  [facility] 07/12/2023, 2:00 PM Pager [phone]  If 7PM-7AM, please contact night-coverage [email or web address] Password [email or web address]

## 2023-07-12 NOTE — TOC Transition Note (Signed)
 Transition of Care [facility]) - Discharge Note   Patient Details  Name: Anthony Nelson MRN: [id number] Date of Birth: [birth year]  Transition of [facility] CM/SW Contact:  Tom-Johnson, Harvest Muskrat, RN Phone Number: 07/12/2023, 3:22 PM   Clinical Narrative:     Patient is scheduled for discharge today.  Readmission Risk Assessment done. Home health info, Outpatient f/u, hospital f/u and discharge instructions on AVS. Prescriptions sent to [facility] pharmacy and patient will receive meds prior discharge. Neb machine ordered, CM informed by [employer] that patient received on on 09/29/21 and not eligible at this time for Insurance to pay. Patient opt out of paying out of pocket, states he will get one from [place]. Cost from [employer] is $50. Patient has home O2 and CPAP from [employer].   Daughter, Laurena to transport at discharge.  No further TOC needs noted.       Final next level of care: Home w Home Health Services Barriers to Discharge: Barriers Resolved   Patient Goals and CMS Choice Patient states their goals for this hospitalization and ongoing recovery are:: To return home CMS Medicare.gov Compare Post Acute Care list provided to:: Patient Choice offered to / list presented to : Patient, Adult Children (Daughter Laurena)      Discharge Placement                Patient to be transferred to facility by: Daughter Name of family member notified: [facility]    Discharge Plan and Services Additional resources added to the After Visit Summary for   In-house Referral: Clinical Social Work              DME Arranged: Community education officer (Patient received on in 2023 and not eligible at this time. Patient to purchase from [facility].) DME Agency: NA       HH Arranged: PT, RN [facility] Agency: [employer] [facility] Date [facility] Agency Contacted: 07/12/23 Time HH Agency Contacted: 1117 Representative spoke with at [facility] Agency: Channing  Social Drivers of Health (SDOH) Interventions SDOH Screenings    Food Insecurity: No Food Insecurity (07/12/2023)  Housing: Low Risk  (04/28/2023)  Recent Concern: Housing - Medium Risk (04/10/2023)  Transportation Needs: No Transportation Needs (07/12/2023)  Recent Concern: Transportation Needs - Unmet Transportation Needs (04/28/2023)  Utilities: Not At Risk (07/12/2023)  Alcohol Screen: Low Risk  (12/26/2022)  Depression (PHQ2-9): Low Risk  (01/16/2023)  Physical Activity: Insufficiently Active (12/26/2022)  Social Connections: Moderately Isolated (12/26/2022)  Stress: No Stress Concern Present (12/26/2022)  Tobacco Use: Medium Risk (04/28/2023)     Readmission Risk Interventions    07/12/2023    3:16 PM  Readmission Risk Prevention Plan  Transportation Screening Complete  Medication Review (RN Care Manager) Referral to Pharmacy  PCP or Specialist appointment within 3-5 days of discharge Complete  SW Recovery Care/Counseling Consult Complete  Palliative Care Screening Not Applicable  Skilled Nursing Facility Not Applicable

## 2023-07-12 NOTE — Progress Notes (Signed)
 Anthony Nelson to be discharged Home per MD order. Discussed prescriptions and follow up appointments with the patient. Prescriptions and medication list explained in detail with patient and his son, both verbalized understanding.  Skin clean, dry and intact without evidence of skin break down, no evidence of skin tears noted. IV catheter discontinued intact. Site without signs and symptoms of complications. Dressing and pressure applied. Pt denies pain at the site currently. No complaints noted.  Patient free of lines, drains, and wounds.   An After Visit Summary (AVS) was printed and given to the patient. Patient escorted via wheelchair, and discharged home via private auto.  Franciso Bodily, RN

## 2023-07-14 NOTE — Progress Notes (Deleted)
Established Patient Office Visit  Subjective:  Patient ID: Anthony Nelson, male    DOB: Nov 18, 1960  Age: 63 y.o. MRN: [id number]  CC: Primary care follow-up   HPI 05/02/21 Anthony Nelson presents for primary care visit.  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  He just saw cardiology in July.  He is on chronic 2 L of oxygen home oxygen and is on low-dose furosemide and BiPAP at night.  He also maintains his inhalers and is no longer smoking cigarettes for a year now.  Patient's biggest complaint is that of chronic shoulder pain on the right.  He played sports when he was younger.  He is uncertain whether he tore her rotator cuff or not but is been an issue for about 2 years.  He is not able to abduct the shoulder above the plane of the collarbone.  He has pain anteriorly in the shoulder as well.  He uses over-the-counter medications for this.  Patient needs a repeat lipid panel has not had one since October 2021 goal is LDL less than 70  Patient agrees to and did receive both the Prevnar 20 vaccine and flu shot  09/28/21 Patient seen today for primary care follow-up.  He has severe COPD Gold stage IV and is on BiPAP at night with 2 L oxygen he takes 2 L during the day as well.  He does need a new nebulizer machine.  Unfortunately the patient has lost his housing.  He now lives in a motel.  Patient states his breathing is at baseline. Patient states his shoulder is no longer hurting. Mental health is stable.  7/31 Since the last visit the patient has had improvement in that he now lives with a servant house he has meals there.  He maintains oxygen 2 L but would like to have a light weight portable oxygen concentrator.  He also would be a candidate for pulmonary rehab.  He has minimal productive cough his dyspnea is at baseline.  On arrival blood pressure is 110/69.  He has been to see his primary care provider in the  [place] system but most of his care is here privately  06/05/22 Patient seen in return follow-up he maintains 2 L of oxygen COPD is stable at this time he is interested in getting a portable oxygen concentrator he has a cylinders at this time.  He stays at [facility] and doing well with this he also maintains a relationship with a primary care doctor in the [place] system.  Medications have not changed.  There are no new complaints.  His BMI has increased with nutrition.  Blood pressure on arrival is good 116/73 he needs to process another fecal occult kit the last 1 did not process  01/16/23 The patient is seen in follow-up from December visit overall he is stable from a pulmonary perspective.  He does need to repeat his fecal occult kit test.  He is on oxygen breathing well taking his inhalers.  He follows with his primary care physician in the [place] system.  He is in the servant Center but about to get housing.  There are no new complaints.  07/17/22 Admit date: 07/07/2023 Discharge date: 07/12/2023   Admitted From: Home Disposition:  Home   Discharge Condition:Stable CODE STATUS:FULL Diet recommendation: Regular    Brief/Interim Summary: Patient is a 63 year old male with history of COPD, chronic hypoxic respiratory failure on 2 L  of oxygen at baseline, pulmonary hypertension, GERD, diastolic CHF, homeless ? who presented with here after he was found by motel  staff found him unresponsive when he failed to check out.  On presentation, he had to be intubated for severe hypoxic/hypercarbic respiratory failure.  Patient was managed under ICU service for acute on chronic COPD exacerbation.  Extubated and transferred to [facility] service on 1/8.  Currently on 2 L of oxygen per minute.  PT consulted.  Recommended home health.  Currently at baseline oxygen at rest.  Medically stable for discharge .  Needs to follow-up with pulmonology as an outpatient.   Following problems were addressed during the hospitalization:    Acute on chronic hypoxic/hypercarbic respiratory failure: Had to be intubated on presentation.  On 2 L oxygen at baseline.  Currently on same.   Acute COPD exacerbation: Started on antibiotics, steroids.  He follows with pulmonology as an outpatient.  Past smoker.  Referral provided for pulmonology follow-up.  Continue home inhaler, rescue albuterol inhaler, DuoNeb   Leukocytosis: This is most likely from steroid.  Continue to monitor.  Improved   Acute metabolic encephalopathy: In the setting of hypercapnia.  Currently back to baseline.  Low-dose Xanax for anxiety.   Chronic diastolic CHF/pulmonary hypertension: Continue current medications.  Currently euvolemic   GERD: Continue PPI   Severe protein calorie monitor: BMI of just 14.  Dietitian was consulted and following   Homelessness: TOC following.Plan for dc to an apartment.     Discharge Diagnoses:  Principal Problem:   Respiratory failure with hypoxia [facility])   Past Medical History:  Diagnosis Date   Atherosclerosis of aorta (HCC) 04/06/2020   Bronchitis    COPD (chronic obstructive pulmonary disease) (HCC)    Diastolic heart failure (HCC)    GERD (gastroesophageal reflux disease)    Pulmonary hypertension (HCC)    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   Respiratory failure with hypoxia (HCC)     Past Surgical History:  Procedure Laterality Date   HERNIA REPAIR      Family History  Problem Relation Age of Onset   Cancer Mother    Cancer Father    CAD Father    Heart failure Father    Diabetes Maternal Grandmother    Kidney disease Maternal Grandmother    Kidney cancer Paternal Grandmother    Heart attack Paternal Grandfather     Social History   Socioeconomic History   Marital status: Married    Spouse name: Theodus Ran   Number of children: 3   Years of education: 16   Highest education level: Bachelor's degree (e.g., BA, AB, BS)  Occupational History    Occupation: Security Guard  Tobacco Use   Smoking status: Former    Current packs/day: 0.00    Average packs/day: 0.1 packs/day for 44.0 years (4.4 ttl pk-yrs)    Types: Cigarettes    Start date: 07/20/1976    Quit date: 07/20/2020    Years since quitting: 2.9   Smokeless tobacco: Never  Vaping Use   Vaping status: Never Used  Substance and Sexual Activity   Alcohol use: Not Currently    Comment: Socially    Drug use: No   Sexual activity: Not Currently  Other Topics Concern   Not on file  Social History Narrative   Not on file   Social Drivers of Health   Financial Resource Strain: Not on file  Food Insecurity: No Food Insecurity (07/12/2023)   Hunger Vital  Sign    Worried About Programme researcher, broadcasting/film/video in the Last Year: Never true    Ran Out of Food in the Last Year: Never true  Transportation Needs: No Transportation Needs (07/12/2023)   PRAPARE - Administrator, Civil Service (Medical): No    Lack of Transportation (Non-Medical): No  Recent Concern: Transportation Needs - Unmet Transportation Needs (04/28/2023)   PRAPARE - Administrator, Civil Service (Medical): Yes    Lack of Transportation (Non-Medical): No  Physical Activity: Insufficiently Active (12/26/2022)   Exercise Vital Sign    Days of Exercise per Week: 3 days    Minutes of Exercise per Session: 30 min  Stress: No Stress Concern Present (12/26/2022)   [employer] of Occupational Health - Occupational Stress Questionnaire    Feeling of Stress : Not at all  Social Connections: Moderately Isolated (12/26/2022)   Social Connection and Isolation Panel [NHANES]    Frequency of Communication with Friends and Family: More than three times a week    Frequency of Social Gatherings with Friends and Family: Three times a week    Attends Religious Services: Never    Active Member of Clubs or Organizations: No    Attends Banker Meetings: Never    Marital Status: Married  Careers information officer Violence: Not At Risk (07/12/2023)   Humiliation, Afraid, Rape, and Kick questionnaire    Fear of Current or Ex-Partner: No    Emotionally Abused: No    Physically Abused: No    Sexually Abused: No    Outpatient Medications Prior to Visit  Medication Sig Dispense Refill   albuterol (VENTOLIN HFA) 108 (90 Base) MCG/ACT inhaler Inhale 2 puffs into the lungs every 6 hours as needed for wheezing or shortness of breath. 18 g 2   ALPRAZolam (XANAX) 0.25 MG tablet Take 1 tablet (0.25 mg total) by mouth 3 (three) times daily as needed for anxiety. 15 tablet 0   aspirin EC 81 MG tablet Take 1 tablet (81 mg total) by mouth daily. Swallow whole. 60 tablet 0   docusate sodium (COLACE) 100 MG capsule Take 1 capsule (100 mg total) by mouth 2 (two) times daily. 10 capsule 0   famotidine (PEPCID) 20 MG tablet Take 1 tablet (20 mg total) by mouth 2 (two) times daily. 60 tablet 0   Fluticasone-Umeclidin-Vilant (TRELEGY ELLIPTA) 200-62.5-25 MCG/ACT AEPB Inhale 1 puff into the lungs daily. 60 each 1   furosemide (LASIX) 20 MG tablet Take 1 tablet (20 mg total) by mouth daily as needed. (Patient not taking: Reported on 07/08/2023) 30 tablet 2   GINSENG PO Take 1 capsule by mouth daily. (Patient not taking: Reported on 07/08/2023)     guaiFENesin (MUCINEX) 600 MG 12 hr tablet Take 1 tablet (600 mg total) by mouth 2 (two) times daily as needed for cough or to loosen phlegm. 28 tablet 0   ipratropium-albuterol (DUONEB) 0.5-2.5 (3) MG/3ML SOLN Take 3 mLs by nebulization every 2 (two) hours as needed. 360 mL 1   Multiple Vitamins-Minerals (MULTIVITAMIN WITH MINERALS) tablet Take 1 tablet by mouth daily with breakfast. (Patient not taking: Reported on 07/08/2023)     OXYGEN Inhale 2 L/min into the lungs continuous.      predniSONE (DELTASONE) 20 MG tablet Take 2 tablets (40 mg total) by mouth daily with breakfast for 1 dose. 2 tablet 0   rosuvastatin (CRESTOR) 40 MG tablet Take 1 tablet (40 mg) by mouth daily. (Patient  not taking:  Reported on 07/08/2023) 60 tablet 4   sodium chloride (OCEAN) 0.65 % SOLN nasal spray Place 2 sprays into both nostrils in the morning, at noon, and at bedtime. (Patient taking differently: Place 2 sprays into both nostrils 3 (three) times daily as needed for congestion.) 104 mL 3   VITAMIN E PO Take 1 capsule by mouth daily. (Patient not taking: Reported on 07/08/2023)     No facility-administered medications prior to visit.    Allergies  Allergen Reactions   Erythromycin Other (See Comments)    Allergy from childhood- does not take it    ROS Review of Systems  Constitutional: Negative.   HENT: Negative.  Negative for ear pain, postnasal drip, rhinorrhea, sinus pressure, sore throat, trouble swallowing and voice change.   Eyes: Negative.   Respiratory:  Positive for shortness of breath. Negative for apnea, cough, choking, chest tightness, wheezing and stridor.        Mucus is clear   Cardiovascular: Negative.  Negative for chest pain, palpitations and leg swelling.  Gastrointestinal: Negative.  Negative for abdominal distention, abdominal pain, nausea and vomiting.  Genitourinary: Negative.   Musculoskeletal: Negative.  Negative for arthralgias and myalgias.  Skin: Negative.  Negative for rash.  Allergic/Immunologic: Negative.  Negative for environmental allergies and food allergies.  Neurological: Negative.  Negative for dizziness, syncope, weakness and headaches.  Hematological: Negative.  Negative for adenopathy. Does not bruise/bleed easily.  Psychiatric/Behavioral: Negative.  Negative for agitation, sleep disturbance and suicidal ideas. The patient is not nervous/anxious.       Objective:    Physical Exam Vitals reviewed.  Constitutional:      Appearance: Normal appearance. He is well-developed. He is not diaphoretic.     Comments: Thin  HENT:     Head: Normocephalic and atraumatic.     Nose: No nasal deformity, septal deviation, mucosal edema or rhinorrhea.      Right Sinus: No maxillary sinus tenderness or frontal sinus tenderness.     Left Sinus: No maxillary sinus tenderness or frontal sinus tenderness.     Mouth/Throat:     Pharynx: No oropharyngeal exudate.     Comments: Thyromegaly Eyes:     General: No scleral icterus.    Conjunctiva/sclera: Conjunctivae normal.     Pupils: Pupils are equal, round, and reactive to light.  Neck:     Thyroid: No thyromegaly.     Vascular: No carotid bruit or JVD.     Trachea: Trachea normal. No tracheal tenderness or tracheal deviation.  Cardiovascular:     Rate and Rhythm: Normal rate and regular rhythm.     Chest Wall: PMI is not displaced.     Pulses: Normal pulses. No decreased pulses.     Heart sounds: Normal heart sounds, S1 normal and S2 normal. Heart sounds not distant. No murmur heard.    No systolic murmur is present.     No diastolic murmur is present.     No friction rub. No gallop. No S3 or S4 sounds.  Pulmonary:     Effort: Pulmonary effort is normal. No tachypnea, accessory muscle usage or respiratory distress.     Breath sounds: No stridor. No decreased breath sounds, wheezing, rhonchi or rales.     Comments: Distant breath sounds Chest:     Chest wall: No tenderness.  Abdominal:     General: Bowel sounds are normal. There is no distension.     Palpations: Abdomen is soft. Abdomen is not rigid.     Tenderness: There  is no abdominal tenderness. There is no guarding or rebound.  Musculoskeletal:        General: Normal range of motion.     Cervical back: Normal range of motion and neck supple. No edema, erythema or rigidity. No muscular tenderness. Normal range of motion.  Lymphadenopathy:     Head:     Right side of head: No submental or submandibular adenopathy.     Left side of head: No submental or submandibular adenopathy.     Cervical: No cervical adenopathy.  Skin:    General: Skin is warm and dry.     Coloration: Skin is not pale.     Findings: No rash.     Nails: There  is no clubbing.  Neurological:     Mental Status: He is alert and oriented to person, place, and time.     Sensory: No sensory deficit.  Psychiatric:        Mood and Affect: Mood normal.        Speech: Speech normal.        Behavior: Behavior normal.     There were no vitals taken for this visit. Wt Readings from Last 3 Encounters:  07/10/23 106 lb 0.7 oz (48.1 kg)  04/28/23 130 lb 1.1 oz (59 kg)  04/09/23 130 lb (59 kg)     Health Maintenance Due  Topic Date Due   COLON CANCER SCREENING ANNUAL FOBT  08/23/2020   COVID-19 Vaccine (3 - 2024-25 season) 03/03/2023    There are no preventive care reminders to display for this patient.  Lab Results  Component Value Date   TSH 0.716 01/16/2023   Lab Results  Component Value Date   WBC 14.6 (H) 07/12/2023   HGB 11.8 (L) 07/12/2023   HCT 37.2 (L) 07/12/2023   MCV 98.7 07/12/2023   PLT 193 07/12/2023   Lab Results  Component Value Date   NA 132 (L) 07/11/2023   K 3.5 07/11/2023   CO2 39 (H) 07/11/2023   GLUCOSE 111 (H) 07/11/2023   BUN 7 (L) 07/11/2023   CREATININE 0.54 (L) 07/11/2023   BILITOT 1.2 07/07/2023   ALKPHOS 39 07/07/2023   AST 30 07/07/2023   ALT 20 07/07/2023   PROT 7.1 07/07/2023   ALBUMIN 3.9 07/07/2023   CALCIUM 8.6 (L) 07/11/2023   ANIONGAP 9 07/11/2023   EGFR 100 06/05/2022   Lab Results  Component Value Date   CHOL 223 (H) 06/05/2022   Lab Results  Component Value Date   HDL 90 06/05/2022   Lab Results  Component Value Date   LDLCALC 119 (H) 06/05/2022   Lab Results  Component Value Date   TRIG 84 07/08/2023   Lab Results  Component Value Date   CHOLHDL 2.5 06/05/2022   Lab Results  Component Value Date   HGBA1C 5.6 07/08/2023      Assessment & Plan:   Problem List Items Addressed This Visit   None    No orders of the defined types were placed in this encounter.  Colon cancer screening with fecal occult kit Follow-up: No follow-ups on file.    Shan Levans,  MD

## 2023-07-15 ENCOUNTER — Telehealth: Payer: Self-pay

## 2023-07-15 NOTE — Transitions of Care (Post Inpatient/ED Visit) (Signed)
   07/15/2023  Name: Anthony Nelson MRN: [id number] DOB: 07/27/1960  Today's TOC FU Call Status: Unsuccessful Call (1st Attempt) Date: 07/15/23  Attempted to reach the patient regarding the most recent Inpatient/ED visit.  Follow Up Plan: Additional outreach attempts will be made to reach the patient to complete the Transitions of Care (Post Inpatient/ED visit) call.   Channing Larry, RN, BA, [facility], CRRN [facility] Coordinator, Transition of Care Ph # [phone]

## 2023-07-15 NOTE — Transitions of Care (Post Inpatient/ED Visit) (Signed)
   07/15/2023  Name: Anthony Nelson MRN: [id number] DOB: August 14, 1960  Today's TOC FU Call Status: Today's TOC FU Call Status:: Unsuccessful Call (1st Attempt) Unsuccessful Call (1st Attempt) Date: 07/15/23  Attempted to reach the patient regarding the most recent Inpatient/ED visit.  Follow Up Plan: Additional outreach attempts will be made to reach the patient to complete the Transitions of Care (Post Inpatient/ED visit) call.   He has an appointment with Dr Brien at [facility] on 07/18/2023.   Signature Slater Diesel, RN

## 2023-07-16 ENCOUNTER — Telehealth: Payer: Self-pay

## 2023-07-16 ENCOUNTER — Ambulatory Visit: Payer: Self-pay

## 2023-07-16 NOTE — Patient Outreach (Signed)
  Care Coordination   Follow Up Visit Note   07/16/2023 Name: Anthony Nelson MRN: [id number] DOB: 1960/12/09  Anthony Nelson is a 63 y.o. year old male who sees Anthony Belvie BRAVO, MD for primary care. I spoke with  Anthony Nelson by phone today.  What matters to the patients health and wellness today?   Patient would like to make a full recovery from his COPD.      Goals Addressed             This Visit's Progress    RN Care Coordination Activities: further follow up needed       Care Coordination Interventions: Provided patient with basic written and verbal COPD education on self care/management/and exacerbation prevention Advised patient to track and manage COPD triggers Provided instruction about proper use of medications used for management of COPD including inhalers Advised patient to self assesses COPD action plan zone and make appointment with provider if in the yellow zone for 48 hours without improvement Provided education about and advised patient to utilize infection prevention strategies to reduce risk of respiratory infection Reviewed and discussed with patient his next upcoming scheduled in person visit with PCP provider, Dr. Belvie Nelson Anthony, scheduled for 07/18/23 @2 :30 PM, his daughter Laurena will drive and accompany him to his appointment  Discussed plans with patient for ongoing care coordination follow up and provided patient with direct contact information for nurse care coordinator     Interventions Today    Flowsheet Row Most Recent Value  Chronic Disease   Chronic disease during today's visit Chronic Obstructive Pulmonary Disease (COPD)  General Interventions   General Interventions Discussed/Reviewed General Interventions Discussed, General Interventions Reviewed, Doctor Visits, Durable Medical Equipment (DME)  Doctor Visits Discussed/Reviewed Doctor Visits Discussed, Doctor Visits Reviewed, PCP  Durable Medical Equipment (DME) Oxygen   Education  Interventions   Education Provided Provided Education  Provided Verbal Education On When to see the doctor, Medication  Pharmacy Interventions   Pharmacy Dicussed/Reviewed Pharmacy Topics Reviewed, Pharmacy Topics Discussed, Medications and their functions          SDOH assessments and interventions completed:  No     Care Coordination Interventions:  Yes, provided   Follow up plan: Follow up call scheduled for 07/30/23 @12 :45 PM    Encounter Outcome:  Patient Visit Completed

## 2023-07-16 NOTE — Transitions of Care (Post Inpatient/ED Visit) (Signed)
   07/16/2023  Name: Anthony Nelson MRN: [id number] DOB: 09/29/1960  Today's TOC FU Call Status: Today's TOC FU Call Status:: Unsuccessful Call (2nd Attempt) Unsuccessful Call (2nd Attempt) Date: 07/16/23  Attempted to reach the patient regarding the most recent Inpatient/ED visit.  Follow Up Plan: Additional outreach attempts will be made to reach the patient to complete the Transitions of Care (Post Inpatient/ED visit) call.   Channing Larry, RN, BA, [facility], CRRN [facility] Coordinator, Transition of Care Ph # [phone]

## 2023-07-16 NOTE — Patient Instructions (Signed)
 Visit Information  Thank you for taking time to visit with me today. Please don't hesitate to contact me if I can be of assistance to you.   Following are the goals we discussed today:   Goals Addressed             This Visit's Progress    RN Care Coordination Activities: further follow up needed       Care Coordination Interventions: Provided patient with basic written and verbal COPD education on self care/management/and exacerbation prevention Advised patient to track and manage COPD triggers Provided instruction about proper use of medications used for management of COPD including inhalers Advised patient to self assesses COPD action plan zone and make appointment with provider if in the yellow zone for 48 hours without improvement Provided education about and advised patient to utilize infection prevention strategies to reduce risk of respiratory infection Reviewed and discussed with patient his next upcoming scheduled in person visit with PCP provider, Dr. Belvie FORBES Silvan, scheduled for 07/18/23 @2 :30 PM, his daughter Laurena will drive and accompany him to his appointment  Discussed plans with patient for ongoing care coordination follow up and provided patient with direct contact information for nurse care coordinator           Our next appointment is by telephone on 07/30/23 at 12:45 PM  Please call the care guide team at [phone] if you need to cancel or reschedule your appointment.   If you are experiencing a Mental Health or Behavioral Health Crisis or need someone to talk to, please call 1-800-273-TALK (toll free, 24 hour hotline)  Patient verbalizes understanding of instructions and care plan provided today and agrees to view in MyChart. Active MyChart status and patient understanding of how to access instructions and care plan via MyChart confirmed with patient.     Clayborne Ly RN BSN CCM [place]  [facility], [facility] Nurse Care  Coordinator  Direct Dial: [phone] Website: [email or web address]

## 2023-07-17 ENCOUNTER — Telehealth: Payer: Self-pay

## 2023-07-17 NOTE — Transitions of Care (Post Inpatient/ED Visit) (Signed)
 07/17/2023  Name: Anthony Nelson MRN: [id number] DOB: June 06, 1961  Today's TOC FU Call Status: Today's TOC FU Call Status:: Successful TOC FU Call Completed TOC FU Call Complete Date: 07/17/23 Patient's Name and Date of Birth confirmed.  Transition Care Management Follow-up Telephone Call Date of Discharge: 07/12/23 Discharge Facility: [facility]) Type of Discharge: Inpatient Admission Primary Inpatient Discharge Diagnosis:: Respiratory failure with hypoxia How have you been since you were released from the hospital?: Better Any questions or concerns?: No  Items Reviewed: Did you receive and understand the discharge instructions provided?: Yes Medications obtained,verified, and reconciled?: Partial Review Completed Reason for Partial Mediation Review: patient reluctant to discuss in detail, reviewed some of his main meds Any new allergies since your discharge?: No Dietary orders reviewed?: NA Do you have support at home?: Yes People in Home: child(ren), adult, other relative(s) Name of Support/Comfort Primary Source: daughter, Altamease Jes  Medications Reviewed Today: Medications Reviewed Today     Reviewed by Randye Buttner, RN (Registered Nurse) on 07/17/23 at 1032  Med List Status: <None>   Medication Order Taking? Sig Documenting Provider Last Dose Status Informant  albuterol  (VENTOLIN  HFA) 108 (90 Base) MCG/ACT inhaler [id number]  Inhale 2 puffs into the lungs every 6 hours as needed for wheezing or shortness of breath. Leona Rake, MD  Active   ALPRAZolam  (XANAX ) 0.25 MG tablet [id number]  Take 1 tablet (0.25 mg total) by mouth 3 (three) times daily as needed for anxiety. Leona Rake, MD  Active   aspirin  EC 81 MG tablet [id number] Yes Take 1 tablet (81 mg total) by mouth daily. Swallow whole. Leona Rake, MD Taking Active   docusate sodium  (COLACE) 100 MG capsule [id number]  Take 1 capsule (100 mg total) by mouth 2 (two) times daily. Ezenduka, Nkeiruka J, MD   Active Child, Pharmacy Records           Med Note Arlyss Berkeley Jul 08, 2023  9:16 AM) Audelia Blazer last dose. No capsules left in bottle.   famotidine  (PEPCID ) 20 MG tablet [id number]  Take 1 tablet (20 mg total) by mouth 2 (two) times daily. Ezenduka, Nkeiruka J, MD  Expired 07/08/23 2359 Child, Pharmacy Records           Med Note Arlyss Berkeley Jul 08, 2023  9:16 AM) Unknown last dose.   Fluticasone -Umeclidin-Vilant (TRELEGY ELLIPTA ) 200-62.5-25 MCG/ACT AEPB [id number] Yes Inhale 1 puff into the lungs daily. Leona Rake, MD Taking Active   furosemide  (LASIX ) 20 MG tablet [id number]  Take 1 tablet (20 mg total) by mouth daily as needed.  Patient not taking: Reported on 07/08/2023   Vernell Goldsmith, MD  Expired 04/28/23 2359 Self           Med Note Arlyss Berkeley Jul 08, 2023  9:19 AM) Daughter did not see bottle. Per note in 04/2023, patient still had on hand if needed.   GINSENG PO [id number]  Take 1 capsule by mouth daily.  Patient not taking: Reported on 07/08/2023   [provider]  Active Child, Pharmacy Records           Med Note Arlyss Berkeley Jul 08, 2023  9:19 AM) Daughter did not see. Per 04/2023 note, patient was taking.   guaiFENesin  (MUCINEX ) 600 MG 12 hr tablet [id number] Yes Take 1 tablet (600 mg total) by mouth 2 (two) times daily as needed for cough or to loosen phlegm.  Leona Rake, MD Taking Active   ipratropium-albuterol  (DUONEB) 0.5-2.5 (3) MG/3ML SOLN [id number] Yes Take 3 mLs by nebulization every 2 (two) hours as needed. Leona Rake, MD Taking Active   Multiple Vitamins-Minerals (MULTIVITAMIN WITH MINERALS) tablet [id number]  Take 1 tablet by mouth daily with breakfast. [provider]  Active Child, Pharmacy Records           Med Note Arlyss Berkeley Jul 08, 2023  9:19 AM) Daughter did not see bottle.   OXYGEN  [id number] Yes Inhale 2 L/min into the lungs continuous.  [provider] Taking Active  Child, Pharmacy Records  rosuvastatin  (CRESTOR ) 40 MG tablet [id number]  Take 1 tablet (40 mg) by mouth daily. Vernell Goldsmith, MD  Active Child, Pharmacy Records           Med Note Arlyss Berkeley Jul 08, 2023  9:20 AM) Daughter did not see bottle. Many recent dispenses however.   sodium chloride  (OCEAN) 0.65 % SOLN nasal spray [id number]  Place 2 sprays into both nostrils in the morning, at noon, and at bedtime.  Patient taking differently: Place 2 sprays into both nostrils 3 (three) times daily as needed for congestion.   Vernell Goldsmith, MD  Active Child, Pharmacy Records           Med Note Arlyss Berkeley Jul 08, 2023  9:20 AM) Unknown last dose.   VITAMIN E PO [id number]  Take 1 capsule by mouth daily. [provider]  Active Child, Pharmacy Records           Med Note Arlyss Berkeley Jul 08, 2023  9:20 AM) Unknown last dose. Looks like patient was taking per 04/2023 note.            Home Care and Equipment/Supplies: Were Home Health Services Ordered?: No Any new equipment or medical supplies ordered?: No (patient remains on 2L O2 at baseline)  Functional Questionnaire: Do you need assistance with bathing/showering or dressing?: No Do you need assistance with meal preparation?: No Do you need assistance with eating?: No Do you have difficulty maintaining continence: No Do you need assistance with getting out of bed/getting out of a chair/moving?: No Do you have difficulty managing or taking your medications?: No  Follow up appointments reviewed: PCP Follow-up appointment confirmed?: Yes Date of PCP follow-up appointment?: 07/18/23 Follow-up Provider: Dr. Arlene Lacy Specialist [facility] Follow-up appointment confirmed?: NA Do you need transportation to your follow-up appointment?: No (daughter Altamease Jes to take patient to PCP office) Do you understand care options if your condition(s) worsen?: Yes-patient verbalized  understanding    Santina Cull, RN, BA, [facility], CRRN [facility] Population Health Care Management Coordinator, Transition of Care Ph # [phone]

## 2023-07-18 ENCOUNTER — Ambulatory Visit: Payer: Medicare HMO | Admitting: Critical Care Medicine

## 2023-07-22 ENCOUNTER — Ambulatory Visit: Payer: Medicare HMO | Admitting: Family Medicine

## 2023-07-24 ENCOUNTER — Ambulatory Visit: Payer: Medicare HMO | Admitting: Family Medicine

## 2023-07-26 ENCOUNTER — Other Ambulatory Visit: Payer: Self-pay

## 2023-07-26 ENCOUNTER — Other Ambulatory Visit: Payer: Self-pay | Admitting: Critical Care Medicine

## 2023-07-26 DIAGNOSIS — I5032 Chronic diastolic (congestive) heart failure: Secondary | ICD-10-CM

## 2023-07-26 MED ORDER — FUROSEMIDE 20 MG PO TABS
20.0000 mg | ORAL_TABLET | Freq: Every day | ORAL | 0 refills | Status: DC | PRN
  Filled 2023-07-26 – 2023-07-29 (×4): qty 30, 30d supply, fill #0

## 2023-07-29 ENCOUNTER — Other Ambulatory Visit (HOSPITAL_COMMUNITY): Payer: Self-pay

## 2023-07-29 ENCOUNTER — Other Ambulatory Visit: Payer: Self-pay

## 2023-07-29 ENCOUNTER — Other Ambulatory Visit: Payer: Self-pay | Admitting: Critical Care Medicine

## 2023-07-29 MED ORDER — ROSUVASTATIN CALCIUM 40 MG PO TABS
40.0000 mg | ORAL_TABLET | Freq: Every day | ORAL | 0 refills | Status: DC
  Filled 2023-07-29: qty 30, 30d supply, fill #0

## 2023-07-30 ENCOUNTER — Ambulatory Visit: Payer: Self-pay

## 2023-07-30 DIAGNOSIS — J441 Chronic obstructive pulmonary disease with (acute) exacerbation: Secondary | ICD-10-CM

## 2023-07-30 NOTE — Patient Instructions (Addendum)
Visit Information  Thank you for taking time to visit with me today. Please don't hesitate to contact me if I can be of assistance to you.   Following are the goals we discussed today:   Goals Addressed             This Visit's Progress    RN Care Coordination Activities: further follow up needed   On track    Care Coordination Interventions: Evaluation of current treatment plan related to COPD and patient's adherence to plan as established by provider Determined patient missed his post hospital follow up appointment with his PCP, Dr. Shan Levans Placed outbound call to [facility] with patient to reschedule his missed appointment, the office will call him to schedule due to Dr. Lynelle Doctor schedule is full for the upcoming future, patient verbalizes understanding  Determined patient reports needing help with transportation, his [facility] health plan does not offer transportation, amb referral sent to care guide to further assist Counseled patient on the importance of keeping all scheduled appointments  Discussed plans with patient for ongoing care coordination follow up and provided patient with direct contact information for nurse care coordinator           Our next appointment is by telephone on 08/27/23 at 1:30 PM  Please call the care guide team at [phone] if you need to cancel or reschedule your appointment.   If you are experiencing a Mental Health or Behavioral Health Crisis or need someone to talk to, please call 1-800-273-TALK (toll free, 24 hour hotline)  Patient verbalizes understanding of instructions and care plan provided today and agrees to view in MyChart. Active MyChart status and patient understanding of how to access instructions and care plan via MyChart confirmed with patient.     Delsa Sale RN BSN CCM [place]  [facility], [facility] Health Nurse Care Coordinator  Direct Dial: [phone] Website:  [email or web address]

## 2023-07-30 NOTE — Patient Outreach (Signed)
  Care Coordination   Follow Up Visit Note   07/30/2023 Name: Anthony Nelson MRN: [id number] DOB: July 28, 1960  Anthony Nelson is a 63 y.o. year old male who sees Storm Frisk, MD for primary care. I spoke with  Corlis Leak by phone today.  What matters to the patients health and wellness today?  Patient would like to follow up with his PCP for evaluation of his COPD.     Goals Addressed             This Visit's Progress    RN Care Coordination Activities: further follow up needed   On track    Care Coordination Interventions: Evaluation of current treatment plan related to COPD and patient's adherence to plan as established by provider Determined patient missed his post hospital follow up appointment with his PCP, Dr. Shan Levans Placed outbound call to [facility] with patient to reschedule his missed appointment, the office will call him to schedule due to Dr. Lynelle Doctor schedule is full for the upcoming future, patient verbalizes understanding  Determined patient reports needing help with transportation, his [facility] health plan does not offer transportation, amb referral sent to care guide to further assist Counseled patient on the importance of keeping all scheduled appointments  Discussed plans with patient for ongoing care coordination follow up and provided patient with direct contact information for nurse care coordinator       Interventions Today    Flowsheet Row Most Recent Value  Chronic Disease   Chronic disease during today's visit Chronic Obstructive Pulmonary Disease (COPD)  General Interventions   General Interventions Discussed/Reviewed General Interventions Discussed, General Interventions Reviewed, Doctor Visits, Communication with  Doctor Visits Discussed/Reviewed Doctor Visits Discussed, Doctor Visits Reviewed, PCP  Communication with PCP/Specialists  [Community Health and Wellness]  Education Interventions   Education Provided  Provided Education  Provided Verbal Education On Medication, When to see the doctor  Pharmacy Interventions   Pharmacy Dicussed/Reviewed Pharmacy Topics Discussed, Pharmacy Topics Reviewed, Medications and their functions          SDOH assessments and interventions completed:  Yes  SDOH Interventions Today    Flowsheet Row Most Recent Value  SDOH Interventions   Food Insecurity Interventions Intervention Not Indicated  Housing Interventions Intervention Not Indicated  Transportation Interventions AMB Referral  Utilities Interventions Intervention Not Indicated        Care Coordination Interventions:  Yes, provided   Follow up plan: Follow up call scheduled for 08/27/23 @1 :30 PM    Encounter Outcome:  Patient Visit Completed

## 2023-07-31 ENCOUNTER — Telehealth: Payer: Self-pay | Admitting: *Deleted

## 2023-07-31 NOTE — Progress Notes (Signed)
Complex Care Management Note Care Guide Note  07/31/2023 Name: Anthony Nelson MRN: [id number] DOB: 16-Dec-1960   Complex Care Management Outreach Attempts: An unsuccessful telephone outreach was attempted today to offer the patient information about available complex care management services.  Follow Up Plan:  Additional outreach attempts will be made to offer the patient complex care management information and services.   Encounter Outcome:  No Answer  [facility] Care Guide  Direct Dial:[phone] [phone] Website: [email or web address]

## 2023-08-01 ENCOUNTER — Other Ambulatory Visit (HOSPITAL_COMMUNITY): Payer: Self-pay

## 2023-08-01 ENCOUNTER — Other Ambulatory Visit: Payer: Self-pay

## 2023-08-01 ENCOUNTER — Telehealth: Payer: Self-pay | Admitting: *Deleted

## 2023-08-01 NOTE — Progress Notes (Signed)
Complex Care Management Note Care Guide Note  08/01/2023 Name: Anthony Nelson MRN: [id number] DOB: [birth year]  Anthony Nelson is a 63 y.o. year old male who is a primary care patient of Delford Field Charlcie Cradle, MD . The community resource team was consulted for assistance with Transportation Needs   SDOH screenings and interventions completed:  Yes  SDOH Interventions Today    Flowsheet Row Most Recent Value  SDOH Interventions   Transportation Interventions SCAT (Specialized Community Area Transporation), Other (Comment)  [Mailed the application for [place] access transportation to patient at his request]        Care guide performed the following interventions: Patient provided with information about care guide support team and interviewed to confirm resource needs. Requested application to be mailed  Follow Up Plan:  No further follow up planned at this time. The patient has been provided with needed resources.  Encounter Outcome:  Patient Visit Completed Dione Booze  [facility] HealthPopulation Health Care Guide  Direct [phone] Fax:[phone] Website: [email or web address]

## 2023-08-13 ENCOUNTER — Other Ambulatory Visit: Payer: Self-pay

## 2023-08-27 ENCOUNTER — Ambulatory Visit: Payer: Self-pay

## 2023-08-27 NOTE — Patient Outreach (Signed)
 Care Coordination   08/27/2023 Name: Anthony Nelson MRN: [id number] DOB: 02-04-1961   Care Coordination Outreach Attempts:  An unsuccessful outreach was attempted for an appointment today.  Follow Up Plan:  Additional outreach attempts will be made to offer the patient complex care management information and services.   Encounter Outcome:  No Answer   Care Coordination Interventions:  No, not indicated    Delsa Sale RN BSN CCM   [facility], [facility] Nurse Care Coordinator  Direct Dial: [phone] Website: [email or web address]

## 2023-09-03 ENCOUNTER — Ambulatory Visit: Payer: No Typology Code available for payment source | Admitting: Family Medicine

## 2023-09-12 ENCOUNTER — Other Ambulatory Visit: Payer: Self-pay

## 2023-09-23 ENCOUNTER — Other Ambulatory Visit: Payer: Self-pay

## 2023-09-27 ENCOUNTER — Telehealth: Payer: Self-pay | Admitting: *Deleted

## 2023-09-27 NOTE — Progress Notes (Signed)
 Complex Care Management Care Guide Note  09/27/2023 Name: Anthony Nelson MRN: [id number] DOB: 1961-02-16  Anthony Nelson is a 63 y.o. year old male who is a primary care patient of Storm Frisk, MD and is actively engaged with the care management team. I reached out to Anthony Nelson by phone today to assist with re-scheduling  with the RN Case Manager.  Follow up plan: Unsuccessful telephone outreach attempt made. A HIPAA compliant phone message was left for the patient providing contact information and requesting a return call.  Anthony Nelson  [facility]  [facility], [facility] Guide  Direct Dial: [phone]  Fax [phone]

## 2023-10-03 ENCOUNTER — Other Ambulatory Visit: Payer: Self-pay

## 2023-10-03 ENCOUNTER — Other Ambulatory Visit (HOSPITAL_COMMUNITY): Payer: Self-pay

## 2023-10-03 ENCOUNTER — Emergency Department (HOSPITAL_COMMUNITY)
Admission: EM | Admit: 2023-10-03 | Discharge: 2023-10-03 | Disposition: A | Discharge status: Home / Self Care | Attending: Emergency Medicine | Admitting: Emergency Medicine

## 2023-10-03 ENCOUNTER — Emergency Department (HOSPITAL_COMMUNITY)

## 2023-10-03 DIAGNOSIS — Z7982 Long term (current) use of aspirin: Secondary | ICD-10-CM | POA: Insufficient documentation

## 2023-10-03 DIAGNOSIS — Z7951 Long term (current) use of inhaled steroids: Secondary | ICD-10-CM | POA: Insufficient documentation

## 2023-10-03 DIAGNOSIS — R0602 Shortness of breath: Secondary | ICD-10-CM | POA: Diagnosis present

## 2023-10-03 DIAGNOSIS — I959 Hypotension, unspecified: Secondary | ICD-10-CM | POA: Diagnosis not present

## 2023-10-03 DIAGNOSIS — J441 Chronic obstructive pulmonary disease with (acute) exacerbation: Secondary | ICD-10-CM | POA: Diagnosis not present

## 2023-10-03 LAB — COMPREHENSIVE METABOLIC PANEL WITH GFR
ALT: 10 U/L (ref 0–44)
AST: 15 U/L (ref 15–41)
Albumin: 3.3 g/dL — ABNORMAL LOW (ref 3.5–5.0)
Alkaline Phosphatase: 39 U/L (ref 38–126)
Anion gap: 6 (ref 5–15)
BUN: 17 mg/dL (ref 8–23)
CO2: 45 mmol/L — ABNORMAL HIGH (ref 22–32)
Calcium: 8.9 mg/dL (ref 8.9–10.3)
Chloride: 89 mmol/L — ABNORMAL LOW (ref 98–111)
Creatinine, Ser: 0.53 mg/dL — ABNORMAL LOW (ref 0.61–1.24)
GFR, Estimated: >60 mL/min (ref >60)
Glucose, Bld: 107 mg/dL — ABNORMAL HIGH (ref 70–99)
Potassium: 3.5 mmol/L (ref 3.5–5.1)
Sodium: 140 mmol/L (ref 135–145)
Total Bilirubin: 0.6 mg/dL (ref 0.0–1.2)
Total Protein: 6.3 g/dL — ABNORMAL LOW (ref 6.5–8.1)

## 2023-10-03 LAB — CBC WITH DIFFERENTIAL/PLATELET
Abs Immature Granulocytes: 0.01 10*3/uL (ref 0.00–0.07)
Basophils Absolute: 0 10*3/uL (ref 0.0–0.1)
Basophils Relative: 0 %
Eosinophils Absolute: 0.1 10*3/uL (ref 0.0–0.5)
Eosinophils Relative: 1 %
HCT: 33.2 % — ABNORMAL LOW (ref 39.0–52.0)
Hemoglobin: 9.9 g/dL — ABNORMAL LOW (ref 13.0–17.0)
Immature Granulocytes: 0 %
Lymphocytes Relative: 15 %
Lymphs Abs: 0.8 10*3/uL (ref 0.7–4.0)
MCH: 31.8 pg (ref 26.0–34.0)
MCHC: 29.8 g/dL — ABNORMAL LOW (ref 30.0–36.0)
MCV: 106.8 fL — ABNORMAL HIGH (ref 80.0–100.0)
Monocytes Absolute: 0.4 10*3/uL (ref 0.1–1.0)
Monocytes Relative: 7 %
Neutro Abs: 3.9 10*3/uL (ref 1.7–7.7)
Neutrophils Relative %: 77 %
Platelets: 127 10*3/uL — ABNORMAL LOW (ref 150–400)
RBC: 3.11 MIL/uL — ABNORMAL LOW (ref 4.22–5.81)
RDW: 13 % (ref 11.5–15.5)
WBC: 5.1 10*3/uL (ref 4.0–10.5)
nRBC: 0 % (ref 0.0–0.2)

## 2023-10-03 LAB — BRAIN NATRIURETIC PEPTIDE: B Natriuretic Peptide: 37.2 pg/mL (ref 0.0–100.0)

## 2023-10-03 LAB — TROPONIN I (HIGH SENSITIVITY): Troponin I (High Sensitivity): 4 ng/L (ref <18)

## 2023-10-03 MED ORDER — ALBUTEROL SULFATE HFA 108 (90 BASE) MCG/ACT IN AERS
2.0000 | INHALATION_SPRAY | RESPIRATORY_TRACT | Status: DC | PRN
Start: 2023-10-03 — End: 2023-10-03

## 2023-10-03 MED ORDER — ALBUTEROL SULFATE HFA 108 (90 BASE) MCG/ACT IN AERS
2.0000 | INHALATION_SPRAY | Freq: Once | RESPIRATORY_TRACT | Status: AC
  Administered 2023-10-03: 2 via RESPIRATORY_TRACT
  Filled 2023-10-03: qty 6.7

## 2023-10-03 MED ORDER — PREDNISONE 10 MG PO TABS
20.0000 mg | ORAL_TABLET | Freq: Every day | ORAL | 0 refills | Status: DC
Start: 2023-10-03 — End: 2023-11-15
  Filled 2023-10-03: qty 15, 7d supply, fill #0

## 2023-10-03 MED ORDER — METHYLPREDNISOLONE SODIUM SUCC 125 MG IJ SOLR
125.0000 mg | Freq: Once | INTRAMUSCULAR | Status: AC
  Administered 2023-10-03: 125 mg via INTRAVENOUS
  Filled 2023-10-03: qty 2

## 2023-10-03 MED ORDER — MAGNESIUM SULFATE 2 GM/50ML IV SOLN
2.0000 g | Freq: Once | INTRAVENOUS | Status: AC
  Administered 2023-10-03: 2 g via INTRAVENOUS
  Filled 2023-10-03: qty 50

## 2023-10-03 MED ORDER — IPRATROPIUM-ALBUTEROL 0.5-2.5 (3) MG/3ML IN SOLN
3.0000 mL | Freq: Once | RESPIRATORY_TRACT | Status: DC
Start: 2023-10-03 — End: 2023-10-03

## 2023-10-03 MED ORDER — DOXYCYCLINE HYCLATE 100 MG PO CAPS
100.0000 mg | ORAL_CAPSULE | Freq: Two times a day (BID) | ORAL | 0 refills | Status: DC
  Filled 2023-10-03: qty 14, 7d supply, fill #0

## 2023-10-03 MED ORDER — ALBUTEROL SULFATE (2.5 MG/3ML) 0.083% IN NEBU
2.5000 mg | INHALATION_SOLUTION | Freq: Once | RESPIRATORY_TRACT | Status: AC
  Administered 2023-10-03: 2.5 mg via RESPIRATORY_TRACT
  Filled 2023-10-03: qty 3

## 2023-10-03 MED ORDER — IPRATROPIUM-ALBUTEROL 0.5-2.5 (3) MG/3ML IN SOLN
3.0000 mL | Freq: Once | RESPIRATORY_TRACT | Status: AC
  Administered 2023-10-03: 3 mL via RESPIRATORY_TRACT
  Filled 2023-10-03: qty 3

## 2023-10-03 NOTE — Discharge Instructions (Signed)
Follow-up with your doctor next week for recheck.  Return if problems 

## 2023-10-03 NOTE — ED Triage Notes (Signed)
 Pt reports worsening shob on exertion x4-5 days. Hx copd, chf. Lung sounds good. 2L [place] baseline, was dropping into the high 80s. 20ga LAC.   120/70 HR 70s 97% 2L Temp 07.3

## 2023-10-03 NOTE — ED Provider Notes (Signed)
 [place] EMERGENCY DEPARTMENT AT [facility] Provider Note   CSN: [id number] Arrival date & time: 10/03/23  1220     History  Chief Complaint  Patient presents with   Shortness of Breath    Anthony Nelson is a 63 y.o. male.  Patient has a history of COPD.  Patient complains of cough and shortness of breath  The history is provided by the patient and medical records. No language interpreter was used.  Shortness of Breath Severity:  Moderate Onset quality:  Sudden Timing:  Constant Progression:  Worsening Chronicity:  New Context: activity   Relieved by:  Nothing Worsened by:  Nothing Ineffective treatments:  None tried Associated symptoms: no abdominal pain, no chest pain, no cough, no headaches and no rash        Home Medications Prior to Admission medications   Medication Sig Start Date End Date Taking? Authorizing Provider  doxycycline (VIBRAMYCIN) 100 MG capsule Take 1 capsule (100 mg total) by mouth 2 (two) times daily. One po bid x 7 days 10/03/23  Yes Bethann Berkshire, MD  predniSONE (DELTASONE) 10 MG tablet Take 2 tablets (20 mg total) by mouth daily. 10/03/23  Yes Bethann Berkshire, MD  albuterol (VENTOLIN HFA) 108 (90 Base) MCG/ACT inhaler Inhale 2 puffs into the lungs every 6 hours as needed for wheezing or shortness of breath. 07/12/23   Burnadette Pop, MD  ALPRAZolam Prudy Feeler) 0.25 MG tablet Take 1 tablet (0.25 mg total) by mouth 3 (three) times daily as needed for anxiety. 07/12/23   Burnadette Pop, MD  aspirin EC 81 MG tablet Take 1 tablet (81 mg total) by mouth daily. Swallow whole. 07/12/23 10/10/23  Burnadette Pop, MD  docusate sodium (COLACE) 100 MG capsule Take 1 capsule (100 mg total) by mouth 2 (two) times daily. 05/09/23   Briant Cedar, MD  famotidine (PEPCID) 20 MG tablet Take 1 tablet (20 mg total) by mouth 2 (two) times daily. 05/09/23 07/08/23  Briant Cedar, MD  Fluticasone-Umeclidin-Vilant (TRELEGY ELLIPTA) 200-62.5-25 MCG/ACT AEPB  Inhale 1 puff into the lungs daily. 07/12/23 10/24/23  Burnadette Pop, MD  furosemide (LASIX) 20 MG tablet Take 1 tablet (20 mg total) by mouth daily as needed. Please make primary care provider appointment. 07/26/23 07/25/24  Hoy Register, MD  GINSENG PO Take 1 capsule by mouth daily. Patient not taking: Reported on 07/08/2023    [provider]  guaiFENesin (MUCINEX) 600 MG 12 hr tablet Take 1 tablet (600 mg total) by mouth 2 (two) times daily as needed for cough or to loosen phlegm. 07/12/23   Burnadette Pop, MD  ipratropium-albuterol (DUONEB) 0.5-2.5 (3) MG/3ML SOLN Take 3 mLs by nebulization every 2 (two) hours as needed. 07/12/23   Burnadette Pop, MD  Multiple Vitamins-Minerals (MULTIVITAMIN WITH MINERALS) tablet Take 1 tablet by mouth daily with breakfast.    [provider]  OXYGEN Inhale 2 L/min into the lungs continuous.     [provider]  rosuvastatin (CRESTOR) 40 MG tablet Take 1 tablet (40 mg total) by mouth daily. Must have office visit for refills 07/29/23   Hoy Register, MD  sodium chloride (OCEAN) 0.65 % SOLN nasal spray Place 2 sprays into both nostrils in the morning, at noon, and at bedtime. Patient taking differently: Place 2 sprays into both nostrils 3 (three) times daily as needed for congestion. 12/05/20   Storm Frisk, MD  VITAMIN E PO Take 1 capsule by mouth daily.    [provider]  Allergies    Erythromycin    Review of Systems   Review of Systems  Constitutional:  Negative for appetite change and fatigue.  HENT:  Negative for congestion, ear discharge and sinus pressure.   Eyes:  Negative for discharge.  Respiratory:  Positive for shortness of breath. Negative for cough.   Cardiovascular:  Negative for chest pain.  Gastrointestinal:  Negative for abdominal pain and diarrhea.  Genitourinary:  Negative for frequency and hematuria.  Musculoskeletal:  Negative for back pain.  Skin:  Negative for rash.  Neurological:   Negative for seizures and headaches.  Psychiatric/Behavioral:  Negative for hallucinations.     Physical Exam Updated Vital Signs BP 115/80 (BP Location: Right Arm)   Pulse 81   Temp 98.4 F (36.9 C) (Oral)   Resp 13   Ht 6' (1.829 m)   Wt 47.6 kg   SpO2 97%   BMI 14.24 kg/m  Physical Exam Vitals and nursing note reviewed.  Constitutional:      Appearance: He is well-developed.  HENT:     Head: Normocephalic.     Nose: Nose normal.  Eyes:     General: No scleral icterus.    Conjunctiva/sclera: Conjunctivae normal.  Neck:     Thyroid: No thyromegaly.  Cardiovascular:     Rate and Rhythm: Normal rate and regular rhythm.     Heart sounds: No murmur heard.    No friction rub. No gallop.  Pulmonary:     Breath sounds: No stridor. Wheezing present. No rales.  Chest:     Chest wall: No tenderness.  Abdominal:     General: There is no distension.     Tenderness: There is no abdominal tenderness. There is no rebound.  Musculoskeletal:        General: Normal range of motion.     Cervical back: Neck supple.  Lymphadenopathy:     Cervical: No cervical adenopathy.  Skin:    Findings: No erythema or rash.  Neurological:     Mental Status: He is alert and oriented to person, place, and time.     Motor: No abnormal muscle tone.     Coordination: Coordination normal.  Psychiatric:        Behavior: Behavior normal.     ED Results / Procedures / Treatments   Labs (all labs ordered are listed, but only abnormal results are displayed) Labs Reviewed  CBC WITH DIFFERENTIAL/PLATELET - Abnormal; Notable for the following components:      Result Value   RBC 3.11 (*)    Hemoglobin 9.9 (*)    HCT 33.2 (*)    MCV 106.8 (*)    MCHC 29.8 (*)    Platelets 127 (*)    All other components within normal limits  COMPREHENSIVE METABOLIC PANEL WITH GFR - Abnormal; Notable for the following components:   Chloride 89 (*)    CO2 45 (*)    Glucose, Bld 107 (*)    Creatinine, Ser 0.53  (*)    Total Protein 6.3 (*)    Albumin 3.3 (*)    All other components within normal limits  RESP PANEL BY RT-PCR (RSV, FLU A&B, COVID)  RVPGX2  BRAIN NATRIURETIC PEPTIDE  TROPONIN I (HIGH SENSITIVITY)  TROPONIN I (HIGH SENSITIVITY)    EKG EKG Interpretation Date/Time:  Thursday October 03 2023 13:56:25 EDT Ventricular Rate:  85 PR Interval:  136 QRS Duration:  91 QT Interval:  356 QTC Calculation: 424 R Axis:   124  Text Interpretation:  Sinus rhythm Left posterior fascicular block Abnormal R-wave progression, late transition Inferior infarct, acute (LCx) Lateral leads are also involved >>> Acute MI <<< Confirmed by Bethann Berkshire [phone]) on 10/03/2023 4:05:35 PM  Radiology DG Chest 2 View Result Date: 10/03/2023 CLINICAL DATA:  Shortness of breath. EXAM: CHEST - 2 VIEW COMPARISON:  Chest radiograph dated 07/27/2023. FINDINGS: The heart size and mediastinal contours are within normal limits. Emphysema. No focal consolidation, pleural effusion, or pneumothorax. No acute osseous abnormality. IMPRESSION: No acute cardiopulmonary findings.  Emphysema. Electronically Signed   By: Hart Robinsons M.D.   On: 10/03/2023 13:58    Procedures Procedures    Medications Ordered in ED Medications  albuterol (VENTOLIN HFA) 108 (90 Base) MCG/ACT inhaler 2 puff (has no administration in time range)  ipratropium-albuterol (DUONEB) 0.5-2.5 (3) MG/3ML nebulizer solution 3 mL (3 mLs Nebulization Given 10/03/23 1404)  magnesium sulfate IVPB 2 g 50 mL (2 g Intravenous New Bag/Given 10/03/23 1446)  methylPREDNISolone sodium succinate (SOLU-MEDROL) 125 mg/2 mL injection 125 mg (125 mg Intravenous Given 10/03/23 1446)  albuterol (PROVENTIL) (2.5 MG/3ML) 0.083% nebulizer solution 2.5 mg (2.5 mg Nebulization Given 10/03/23 1404)    ED Course/ Medical Decision Making/ A&P                                 Medical Decision Making Amount and/or Complexity of Data Reviewed Labs: ordered. Radiology:  ordered.  Risk Prescription drug management.  Patient with COPD exacerbation.  He improved with neb treatments.  He will be sent home with doxycycline and prednisone and continue his albuterol        Final Clinical Impression(s) / ED Diagnoses Final diagnoses:  COPD exacerbation (HCC)    Rx / DC Orders ED Discharge Orders          Ordered    predniSONE (DELTASONE) 10 MG tablet  Daily        10/03/23 1620    doxycycline (VIBRAMYCIN) 100 MG capsule  2 times daily        10/03/23 1620              Bethann Berkshire, MD 10/06/23 1040

## 2023-10-04 ENCOUNTER — Other Ambulatory Visit: Payer: Self-pay

## 2023-10-08 NOTE — Progress Notes (Signed)
 Complex Care Management Care Guide Note  10/08/2023 Name: RAYANSH HERBST MRN: [id number] DOB: [birth year]  MUHAMMED TEUTSCH is a 63 y.o. year old male who is a primary care patient of Storm Frisk, MD and is actively engaged with the care management team. I reached out to Corlis Leak by phone today to assist with re-scheduling  with the RN Case Manager.  Follow up plan: Unsuccessful telephone outreach attempt made. A HIPAA compliant phone message was left for the patient providing contact information and requesting a return call. No further outreach attempts will be made at this time. We have been unable to contact the patient to reschedule for complex care management services.   Gwenevere Ghazi  [facility] Guide  Direct Dial: [phone]  Fax [phone]

## 2023-11-15 ENCOUNTER — Encounter (HOSPITAL_COMMUNITY): Payer: Self-pay

## 2023-11-15 ENCOUNTER — Emergency Department (HOSPITAL_COMMUNITY)

## 2023-11-15 ENCOUNTER — Emergency Department (HOSPITAL_COMMUNITY)
Admission: EM | Admit: 2023-11-15 | Discharge: 2023-11-15 | Disposition: A | Discharge status: Home / Self Care | Attending: Emergency Medicine | Admitting: Emergency Medicine

## 2023-11-15 ENCOUNTER — Other Ambulatory Visit: Payer: Self-pay

## 2023-11-15 DIAGNOSIS — Z7951 Long term (current) use of inhaled steroids: Secondary | ICD-10-CM | POA: Diagnosis not present

## 2023-11-15 DIAGNOSIS — J441 Chronic obstructive pulmonary disease with (acute) exacerbation: Secondary | ICD-10-CM | POA: Diagnosis not present

## 2023-11-15 DIAGNOSIS — Z7952 Long term (current) use of systemic steroids: Secondary | ICD-10-CM | POA: Insufficient documentation

## 2023-11-15 DIAGNOSIS — R0602 Shortness of breath: Secondary | ICD-10-CM | POA: Diagnosis not present

## 2023-11-15 LAB — HEPATIC FUNCTION PANEL
ALT: 9 U/L (ref 0–44)
AST: 15 U/L (ref 15–41)
Albumin: 3.5 g/dL (ref 3.5–5.0)
Alkaline Phosphatase: 39 U/L (ref 38–126)
Bilirubin, Direct: <0.1 mg/dL (ref 0.0–0.2)
Total Bilirubin: 0.4 mg/dL (ref 0.0–1.2)
Total Protein: 6.3 g/dL — ABNORMAL LOW (ref 6.5–8.1)

## 2023-11-15 LAB — BASIC METABOLIC PANEL WITH GFR
Anion gap: 11 (ref 5–15)
BUN: 18 mg/dL (ref 8–23)
CO2: 40 mmol/L — ABNORMAL HIGH (ref 22–32)
Calcium: 8.9 mg/dL (ref 8.9–10.3)
Chloride: 89 mmol/L — ABNORMAL LOW (ref 98–111)
Creatinine, Ser: 0.59 mg/dL — ABNORMAL LOW (ref 0.61–1.24)
GFR, Estimated: >60 mL/min (ref >60)
Glucose, Bld: 81 mg/dL (ref 70–99)
Potassium: 3.8 mmol/L (ref 3.5–5.1)
Sodium: 140 mmol/L (ref 135–145)

## 2023-11-15 LAB — TROPONIN I (HIGH SENSITIVITY)
Troponin I (High Sensitivity): 6 ng/L
Troponin I (High Sensitivity): 5 ng/L (ref <18)

## 2023-11-15 LAB — CBC
HCT: 33.4 % — ABNORMAL LOW (ref 39.0–52.0)
Hemoglobin: 10.1 g/dL — ABNORMAL LOW (ref 13.0–17.0)
MCH: 31.4 pg (ref 26.0–34.0)
MCHC: 30.2 g/dL (ref 30.0–36.0)
MCV: 103.7 fL — ABNORMAL HIGH (ref 80.0–100.0)
Platelets: 163 10*3/uL (ref 150–400)
RBC: 3.22 MIL/uL — ABNORMAL LOW (ref 4.22–5.81)
RDW: 12.5 % (ref 11.5–15.5)
WBC: 5.5 10*3/uL (ref 4.0–10.5)
nRBC: 0 % (ref 0.0–0.2)

## 2023-11-15 LAB — BRAIN NATRIURETIC PEPTIDE: B Natriuretic Peptide: 10.3 pg/mL (ref 0.0–100.0)

## 2023-11-15 MED ORDER — PREDNISONE 10 MG PO TABS: 40.0000 mg | ORAL_TABLET | Freq: Every day | ORAL | 0 refills | Status: DC

## 2023-11-15 MED ORDER — METHYLPREDNISOLONE SODIUM SUCC 125 MG IJ SOLR
125.0000 mg | Freq: Once | INTRAMUSCULAR | Status: AC
  Administered 2023-11-15: 125 mg via INTRAVENOUS
  Filled 2023-11-15: qty 2

## 2023-11-15 NOTE — ED Provider Notes (Signed)
 [place] EMERGENCY DEPARTMENT AT [facility] Provider Note   CSN: [id number] Arrival date & time: 11/15/23  1256     History  Chief Complaint  Patient presents with   Shortness of Breath    RIGLEY NIESS is a 63 y.o. male.  Duplicate note       Home Medications Prior to Admission medications   Medication Sig Start Date End Date Taking? Authorizing Provider  predniSONE  (DELTASONE ) 10 MG tablet Take 4 tablets (40 mg total) by mouth daily. 11/15/23  Yes Lurine Imel, MD  albuterol  (VENTOLIN  HFA) 108 (90 Base) MCG/ACT inhaler Inhale 2 puffs into the lungs every 6 hours as needed for wheezing or shortness of breath. 07/12/23   Leona Rake, MD  ALPRAZolam  (XANAX ) 0.25 MG tablet Take 1 tablet (0.25 mg total) by mouth 3 (three) times daily as needed for anxiety. 07/12/23   Leona Rake, MD  docusate sodium  (COLACE) 100 MG capsule Take 1 capsule (100 mg total) by mouth 2 (two) times daily. 05/09/23   Ezenduka, Nkeiruka J, MD  doxycycline  (VIBRAMYCIN ) 100 MG capsule Take 1 capsule (100 mg total) by mouth 2 (two) times daily for 7 days . 10/03/23   Zammit, Joseph, MD  famotidine  (PEPCID ) 20 MG tablet Take 1 tablet (20 mg total) by mouth 2 (two) times daily. 05/09/23 07/08/23  Ezenduka, Nkeiruka J, MD  furosemide  (LASIX ) 20 MG tablet Take 1 tablet (20 mg total) by mouth daily as needed. Please make primary care provider appointment. 07/26/23 07/25/24  Newlin, Enobong, MD  GINSENG PO Take 1 capsule by mouth daily. Patient not taking: Reported on 07/08/2023    [provider]  guaiFENesin  (MUCINEX ) 600 MG 12 hr tablet Take 1 tablet (600 mg total) by mouth 2 (two) times daily as needed for cough or to loosen phlegm. 07/12/23   Leona Rake, MD  ipratropium-albuterol  (DUONEB) 0.5-2.5 (3) MG/3ML SOLN Take 3 mLs by nebulization every 2 (two) hours as needed. 07/12/23   Adhikari, Amrit, MD  Multiple Vitamins-Minerals (MULTIVITAMIN WITH MINERALS) tablet Take 1 tablet by mouth  daily with breakfast.    [provider]  OXYGEN  Inhale 2 L/min into the lungs continuous.     [provider]  rosuvastatin  (CRESTOR ) 40 MG tablet Take 1 tablet (40 mg total) by mouth daily. Must have office visit for refills 07/29/23   Joaquin Mulberry, MD  sodium chloride  (OCEAN) 0.65 % SOLN nasal spray Place 2 sprays into both nostrils in the morning, at noon, and at bedtime. Patient taking differently: Place 2 sprays into both nostrils 3 (three) times daily as needed for congestion. 12/05/20   Vernell Goldsmith, MD  VITAMIN E PO Take 1 capsule by mouth daily.    [provider]      Allergies    Erythromycin    Review of Systems   Review of Systems  Physical Exam Updated Vital Signs BP 119/78   Pulse 90   Temp 98.3 F (36.8 C)   Resp 18   Ht 1.829 m (6')   Wt 49.9 kg   SpO2 99%   BMI 14.92 kg/m  Physical Exam  ED Results / Procedures / Treatments   Labs (all labs ordered are listed, but only abnormal results are displayed) Labs Reviewed  BASIC METABOLIC PANEL WITH GFR - Abnormal; Notable for the following components:      Result Value   Chloride 89 (*)    CO2 40 (*)    Creatinine, Ser 0.59 (*)  All other components within normal limits  CBC - Abnormal; Notable for the following components:   RBC 3.22 (*)    Hemoglobin 10.1 (*)    HCT 33.4 (*)    MCV 103.7 (*)    All other components within normal limits  HEPATIC FUNCTION PANEL - Abnormal; Notable for the following components:   Total Protein 6.3 (*)    All other components within normal limits  BRAIN NATRIURETIC PEPTIDE  TROPONIN I (HIGH SENSITIVITY)  TROPONIN I (HIGH SENSITIVITY)    EKG None  Radiology DG Chest 2 View Result Date: 11/15/2023 CLINICAL DATA:  Shortness of breath EXAM: CHEST - 2 VIEW COMPARISON:  X-ray 10/03/2023 and older FINDINGS: Hyperinflation. Chronic lung changes. No consolidation, pneumothorax or effusion. No edema. The blunting of the costophrenic angles  bilaterally could relate to pleural thickening rather than tiny effusions. Normal cardiopericardial silhouette. IMPRESSION: Hyperinflation with chronic changes. Electronically Signed   By: Adrianna Horde M.D.   On: 11/15/2023 13:51    Procedures Procedures    Medications Ordered in ED Medications  methylPREDNISolone  sodium succinate (SOLU-MEDROL ) 125 mg/2 mL injection 125 mg (125 mg Intravenous Given 11/15/23 2250)    ED Course/ Medical Decision Making/ A&P                                 Medical Decision Making Amount and/or Complexity of Data Reviewed Labs: ordered. Radiology: ordered.  Risk Prescription drug management.    Final Clinical Impression(s) / ED Diagnoses Final diagnoses:  COPD exacerbation (HCC)    Rx / DC Orders ED Discharge Orders          Ordered    predniSONE  (DELTASONE ) 10 MG tablet  Daily        11/15/23 2322              Nicklas Barns, MD 11/16/23 0013

## 2023-11-15 NOTE — ED Notes (Signed)
 Patient transported to X-ray

## 2023-11-15 NOTE — Discharge Instructions (Addendum)
 Contact [facility] for follow-up I know Dr. Brent Cambric used to see you.  Take the prednisone  as directed for the next 5 days starting tomorrow.  Return for any new or worse symptoms.  Use your nebulizer treatment that you have at home if you have any breathing difficulties.

## 2023-11-15 NOTE — ED Provider Triage Note (Signed)
 Emergency Medicine Provider Triage Evaluation Note  DELVIN MARPLE , a 63 y.o. male  was evaluated in triage.  Pt complains of sob worsening over the last 1 week without productive cough.  Minimal improvement with inhalers.  No chest pain or abd pain.  Also c/o of issues with his right eye.  Has had leg swelling but does not use his diuretics due to have to urinate all the time as is.  Review of Systems  Positive: Shortness of breath, leg swelling Negative: Chest pain, fever, abdominal pain or vomiting  Physical Exam  BP 102/66 (BP Location: Left Arm)   Pulse 80   Temp 98.6 F (37 C) (Oral)   Resp 19   Ht 6' (1.829 m)   Wt 49.9 kg   SpO2 99%   BMI 14.92 kg/m  Gen:   Awake, no distress   Resp:  Normal effort significantly diminished breath sounds without notable wheezing MSK:   Moves extremities without difficulty 2-3+ pitting edema bilaterally up to the knee Other:  Regular rate and rhythm  Medical Decision Making  Medically screening exam initiated at 1:36 PM.  Appropriate orders placed.  MUAZ ALDRIDGE was informed that the remainder of the evaluation will be completed by another provider, this initial triage assessment does not replace that evaluation, and the importance of remaining in the ED until their evaluation is complete.     Almond Army, MD 11/15/23 [phone]

## 2023-11-15 NOTE — ED Provider Notes (Signed)
 [place] EMERGENCY DEPARTMENT AT [facility] Provider Note   CSN: [id number] Arrival date & time: 11/15/23  1256     History  Chief Complaint  Patient presents with   Shortness of Breath    Anthony Nelson is a 63 y.o. male.  Patient with a known history of COPD.  Patient had a lot of breathing problems this morning.  Did not use his rescue nebulizer however.  Patient has had difficulty respiratory failure with hypoxia.  Patient is normally on 3 L of oxygen  at baseline.  Currently here on 3 L patient satting 94% or better.  Says he actually feels better here patient times was 97% on his oxygen  EMS said blood pressure was 106/70.  Patient states that usually when he ends up getting discharge he is on steroids he feels better and then when they wear off he feels a little bit worse.  Patient supposed to be followed by pulmonary medicine but he is not sure about follow-up he has a history of pulmonary hypertension and diastolic heart failure.  Patient denies any chest pain.  Patient is a former smoker quit in 2022.  Patient denies any fevers.  Temp here was 98.3 respiration 17 blood pressure 105/66 oxygen  saturations at 100% on his oxygen .  Pulse 88.       Home Medications Prior to Admission medications   Medication Sig Start Date End Date Taking? Authorizing Provider  albuterol  (VENTOLIN  HFA) 108 (90 Base) MCG/ACT inhaler Inhale 2 puffs into the lungs every 6 hours as needed for wheezing or shortness of breath. 07/12/23   Leona Rake, MD  ALPRAZolam  (XANAX ) 0.25 MG tablet Take 1 tablet (0.25 mg total) by mouth 3 (three) times daily as needed for anxiety. 07/12/23   Leona Rake, MD  docusate sodium  (COLACE) 100 MG capsule Take 1 capsule (100 mg total) by mouth 2 (two) times daily. 05/09/23   Ezenduka, Nkeiruka J, MD  doxycycline  (VIBRAMYCIN ) 100 MG capsule Take 1 capsule (100 mg total) by mouth 2 (two) times daily for 7 days . 10/03/23   Zammit, Joseph, MD  famotidine  (PEPCID )  20 MG tablet Take 1 tablet (20 mg total) by mouth 2 (two) times daily. 05/09/23 07/08/23  Ezenduka, Nkeiruka J, MD  furosemide  (LASIX ) 20 MG tablet Take 1 tablet (20 mg total) by mouth daily as needed. Please make primary care provider appointment. 07/26/23 07/25/24  Newlin, Enobong, MD  GINSENG PO Take 1 capsule by mouth daily. Patient not taking: Reported on 07/08/2023    [provider]  guaiFENesin  (MUCINEX ) 600 MG 12 hr tablet Take 1 tablet (600 mg total) by mouth 2 (two) times daily as needed for cough or to loosen phlegm. 07/12/23   Leona Rake, MD  ipratropium-albuterol  (DUONEB) 0.5-2.5 (3) MG/3ML SOLN Take 3 mLs by nebulization every 2 (two) hours as needed. 07/12/23   Adhikari, Amrit, MD  Multiple Vitamins-Minerals (MULTIVITAMIN WITH MINERALS) tablet Take 1 tablet by mouth daily with breakfast.    [provider]  OXYGEN  Inhale 2 L/min into the lungs continuous.     [provider]  predniSONE  (DELTASONE ) 10 MG tablet Take 2 tablets (20 mg total) by mouth daily. 10/03/23   Zammit, Joseph, MD  rosuvastatin  (CRESTOR ) 40 MG tablet Take 1 tablet (40 mg total) by mouth daily. Must have office visit for refills 07/29/23   Joaquin Mulberry, MD  sodium chloride  (OCEAN) 0.65 % SOLN nasal spray Place 2 sprays into both nostrils in the morning, at noon, and  at bedtime. Patient taking differently: Place 2 sprays into both nostrils 3 (three) times daily as needed for congestion. 12/05/20   Vernell Goldsmith, MD  VITAMIN E PO Take 1 capsule by mouth daily.    [provider]      Allergies    Erythromycin    Review of Systems   Review of Systems  Constitutional:  Negative for chills and fever.  HENT:  Negative for ear pain and sore throat.   Eyes:  Negative for pain and visual disturbance.  Respiratory:  Positive for shortness of breath and wheezing. Negative for cough.   Cardiovascular:  Negative for chest pain and palpitations.  Gastrointestinal:  Negative for  abdominal pain and vomiting.  Genitourinary:  Negative for dysuria and hematuria.  Musculoskeletal:  Negative for arthralgias and back pain.  Skin:  Negative for color change and rash.  Neurological:  Negative for seizures and syncope.  All other systems reviewed and are negative.   Physical Exam Updated Vital Signs BP (!) 107/56   Pulse 93   Temp 98.3 F (36.8 C)   Resp 16   Ht 1.829 m (6')   Wt 49.9 kg   SpO2 100%   BMI 14.92 kg/m  Physical Exam Vitals and nursing note reviewed.  Constitutional:      General: He is not in acute distress.    Appearance: Normal appearance. He is well-developed. He is not ill-appearing.  HENT:     Head: Normocephalic and atraumatic.     Mouth/Throat:     Mouth: Mucous membranes are moist.  Eyes:     Conjunctiva/sclera: Conjunctivae normal.  Cardiovascular:     Rate and Rhythm: Normal rate and regular rhythm.     Heart sounds: No murmur heard. Pulmonary:     Effort: Pulmonary effort is normal. No respiratory distress.     Breath sounds: Normal breath sounds. No wheezing, rhonchi or rales.  Abdominal:     Palpations: Abdomen is soft.     Tenderness: There is no abdominal tenderness.  Musculoskeletal:        General: No swelling.     Cervical back: Neck supple.  Skin:    General: Skin is warm and dry.     Capillary Refill: Capillary refill takes less than 2 seconds.  Neurological:     General: No focal deficit present.     Mental Status: He is alert and oriented to person, place, and time.  Psychiatric:        Mood and Affect: Mood normal.     ED Results / Procedures / Treatments   Labs (all labs ordered are listed, but only abnormal results are displayed) Labs Reviewed  BASIC METABOLIC PANEL WITH GFR - Abnormal; Notable for the following components:      Result Value   Chloride 89 (*)    CO2 40 (*)    Creatinine, Ser 0.59 (*)    All other components within normal limits  CBC - Abnormal; Notable for the following  components:   RBC 3.22 (*)    Hemoglobin 10.1 (*)    HCT 33.4 (*)    MCV 103.7 (*)    All other components within normal limits  HEPATIC FUNCTION PANEL - Abnormal; Notable for the following components:   Total Protein 6.3 (*)    All other components within normal limits  BRAIN NATRIURETIC PEPTIDE  TROPONIN I (HIGH SENSITIVITY)  TROPONIN I (HIGH SENSITIVITY)    EKG None  Radiology DG Chest 2 View  Result Date: 11/15/2023 CLINICAL DATA:  Shortness of breath EXAM: CHEST - 2 VIEW COMPARISON:  X-ray 10/03/2023 and older FINDINGS: Hyperinflation. Chronic lung changes. No consolidation, pneumothorax or effusion. No edema. The blunting of the costophrenic angles bilaterally could relate to pleural thickening rather than tiny effusions. Normal cardiopericardial silhouette. IMPRESSION: Hyperinflation with chronic changes. Electronically Signed   By: Adrianna Horde M.D.   On: 11/15/2023 13:51    Procedures Procedures    Medications Ordered in ED Medications  methylPREDNISolone  sodium succinate (SOLU-MEDROL ) 125 mg/2 mL injection 125 mg (125 mg Intravenous Given 11/15/23 2250)    ED Course/ Medical Decision Making/ A&P                                 Medical Decision Making Amount and/or Complexity of Data Reviewed Labs: ordered. Radiology: ordered.  Risk Prescription drug management.   Patient with any wheezing currently.  Does admit that his breathing seems much better.  Will give him some Medrol  and then continue him on prednisone  40 mg for the next 5 which goes along with his chronic COPD.  Renal function GFR was greater than 60 white count was 5.5 hemoglobin 10.1 down a little bit platelets are 163.  Two-view chest without any acute findings.   Final Clinical Impression(s) / ED Diagnoses Final diagnoses:  COPD exacerbation [facility])    Rx / DC Orders ED Discharge Orders     None         Nicklas Barns, MD 11/15/23 2310

## 2023-11-15 NOTE — ED Triage Notes (Signed)
 Pt BIB [facility] from home d/t SOB the last couple of days. Hx of COPD, has been using inhalers at home with no relief of symptoms, wears 3L O2 at baseline. EMS reports upper LS are clear with lowers diminished with some wheezing. Is 97% on his O2, 106/70, 80 bpm, 18g Lt AC PIV, 12L unremarkable.

## 2023-11-15 NOTE — ED Notes (Signed)
 ED Provider at bedside.

## 2023-11-29 ENCOUNTER — Other Ambulatory Visit: Payer: Self-pay

## 2023-11-29 ENCOUNTER — Inpatient Hospital Stay: Admitting: Nurse Practitioner

## 2023-11-29 ENCOUNTER — Other Ambulatory Visit (HOSPITAL_COMMUNITY): Payer: Self-pay

## 2023-11-29 ENCOUNTER — Other Ambulatory Visit (HOSPITAL_BASED_OUTPATIENT_CLINIC_OR_DEPARTMENT_OTHER): Payer: Self-pay

## 2023-12-02 ENCOUNTER — Other Ambulatory Visit (HOSPITAL_COMMUNITY): Payer: Self-pay

## 2023-12-02 ENCOUNTER — Other Ambulatory Visit: Payer: Self-pay

## 2023-12-03 ENCOUNTER — Ambulatory Visit: Payer: Self-pay

## 2023-12-03 NOTE — Telephone Encounter (Signed)
 Copied from CRM [id number]. Topic: Clinical - Medication Question >> Dec 03, 2023 12:05 PM Alysia Jumbo S wrote: Reason for CRM: Natasha with [facility] has medication questions  Answer Assessment - Initial Assessment Questions 1. REASON FOR CALL or QUESTION: "What is your reason for calling today?" or "How can I best help you?" or "What question do you have that I can help answer?"     Ilda Malkin from Home health called regarding pt.  Stated pt is not a good historian therefore she is reaching out to PCP staff to get help with medications and finding out who wrote prescription for and can do re-fill on Trelegy.  Also stated last time patient filled the medication it was filled at the hospital.  Also stated may need further information regarding pt care due to pt seeing 2 different providers - 1 provider at [facility] and then us .  Please call Ilda Malkin at [phone]  Protocols used: Information Only Call - No Triage-A-AH

## 2023-12-06 ENCOUNTER — Telehealth: Payer: Self-pay

## 2023-12-06 NOTE — Telephone Encounter (Signed)
 error

## 2023-12-06 NOTE — Telephone Encounter (Signed)
 Copied from CRM [id number]. Topic: Clinical - Medication Question >> Dec 06, 2023  3:20 PM Zipporah Him wrote: Reason for CRM: Endiah caling in regard to patient  needing a refill on Trellegy inhaler. He was admitted to hospital a while back (she thinks it was [facility]) and was prescribed it from there, and he cannot get a refill due to hospitalist writing prescription , they advised patient to reach out to PCP Dr Brent Cambric. Please return call to [phone], Altamease Jes

## 2023-12-06 NOTE — Telephone Encounter (Signed)
 Spoke with patient. Patient is requesting refills on  his Trellegyinhaler. Advised patient that he will need to be seen by provider. Advised [facility] on Monday  and where it  will be located at [address]. Advised that he needs no appointment and times of operation given.Patient voiced that he will go to [facility] on Monday at 9:00 am. Advised that if he begins to fell worse please seek medical attention at [facility] or the ED. Patient is agreeable to the plan.

## 2023-12-21 ENCOUNTER — Other Ambulatory Visit (HOSPITAL_BASED_OUTPATIENT_CLINIC_OR_DEPARTMENT_OTHER): Payer: Self-pay

## 2023-12-31 ENCOUNTER — Ambulatory Visit: Payer: Medicare HMO | Attending: Critical Care Medicine

## 2024-01-13 ENCOUNTER — Emergency Department (HOSPITAL_COMMUNITY)

## 2024-01-13 ENCOUNTER — Inpatient Hospital Stay (HOSPITAL_COMMUNITY)
Admission: EM | Admit: 2024-01-13 | Discharge: 2024-01-15 | DRG: 190 | Disposition: A | Discharge status: Home Health | Attending: Internal Medicine | Admitting: Internal Medicine

## 2024-01-13 DIAGNOSIS — Z833 Family history of diabetes mellitus: Secondary | ICD-10-CM

## 2024-01-13 DIAGNOSIS — I2729 Other secondary pulmonary hypertension: Secondary | ICD-10-CM | POA: Diagnosis present

## 2024-01-13 DIAGNOSIS — K219 Gastro-esophageal reflux disease without esophagitis: Secondary | ICD-10-CM | POA: Diagnosis present

## 2024-01-13 DIAGNOSIS — Z5900 Homelessness unspecified: Secondary | ICD-10-CM

## 2024-01-13 DIAGNOSIS — Z1152 Encounter for screening for COVID-19: Secondary | ICD-10-CM

## 2024-01-13 DIAGNOSIS — Z8249 Family history of ischemic heart disease and other diseases of the circulatory system: Secondary | ICD-10-CM | POA: Diagnosis not present

## 2024-01-13 DIAGNOSIS — R0902 Hypoxemia: Secondary | ICD-10-CM | POA: Diagnosis not present

## 2024-01-13 DIAGNOSIS — Z79899 Other long term (current) drug therapy: Secondary | ICD-10-CM

## 2024-01-13 DIAGNOSIS — Z681 Body mass index (BMI) 19 or less, adult: Secondary | ICD-10-CM | POA: Diagnosis not present

## 2024-01-13 DIAGNOSIS — J449 Chronic obstructive pulmonary disease, unspecified: Secondary | ICD-10-CM | POA: Diagnosis not present

## 2024-01-13 DIAGNOSIS — I5032 Chronic diastolic (congestive) heart failure: Secondary | ICD-10-CM | POA: Diagnosis present

## 2024-01-13 DIAGNOSIS — Z7982 Long term (current) use of aspirin: Secondary | ICD-10-CM | POA: Diagnosis not present

## 2024-01-13 DIAGNOSIS — I700 Atherosclerosis of aorta: Secondary | ICD-10-CM | POA: Diagnosis present

## 2024-01-13 DIAGNOSIS — Z87891 Personal history of nicotine dependence: Secondary | ICD-10-CM | POA: Diagnosis not present

## 2024-01-13 DIAGNOSIS — R1900 Intra-abdominal and pelvic swelling, mass and lump, unspecified site: Secondary | ICD-10-CM | POA: Diagnosis not present

## 2024-01-13 DIAGNOSIS — I2781 Cor pulmonale (chronic): Secondary | ICD-10-CM | POA: Diagnosis not present

## 2024-01-13 DIAGNOSIS — Z888 Allergy status to other drugs, medicaments and biological substances status: Secondary | ICD-10-CM | POA: Diagnosis not present

## 2024-01-13 DIAGNOSIS — J441 Chronic obstructive pulmonary disease with (acute) exacerbation: Secondary | ICD-10-CM | POA: Diagnosis not present

## 2024-01-13 DIAGNOSIS — J9611 Chronic respiratory failure with hypoxia: Secondary | ICD-10-CM | POA: Diagnosis not present

## 2024-01-13 DIAGNOSIS — E43 Unspecified severe protein-calorie malnutrition: Secondary | ICD-10-CM | POA: Diagnosis not present

## 2024-01-13 DIAGNOSIS — J439 Emphysema, unspecified: Secondary | ICD-10-CM | POA: Diagnosis not present

## 2024-01-13 DIAGNOSIS — I2723 Pulmonary hypertension due to lung diseases and hypoxia: Secondary | ICD-10-CM | POA: Diagnosis not present

## 2024-01-13 DIAGNOSIS — J9612 Chronic respiratory failure with hypercapnia: Secondary | ICD-10-CM | POA: Diagnosis not present

## 2024-01-13 DIAGNOSIS — R0602 Shortness of breath: Secondary | ICD-10-CM | POA: Diagnosis not present

## 2024-01-13 DIAGNOSIS — D539 Nutritional anemia, unspecified: Secondary | ICD-10-CM | POA: Diagnosis present

## 2024-01-13 LAB — BRAIN NATRIURETIC PEPTIDE: B Natriuretic Peptide: 21 pg/mL (ref 0.0–100.0)

## 2024-01-13 LAB — COMPREHENSIVE METABOLIC PANEL WITH GFR
ALT: 10 U/L (ref 0–44)
AST: 19 U/L (ref 15–41)
Albumin: 3.6 g/dL (ref 3.5–5.0)
Alkaline Phosphatase: 41 U/L (ref 38–126)
Anion gap: 10 (ref 5–15)
BUN: 17 mg/dL (ref 8–23)
CO2: 40 mmol/L — ABNORMAL HIGH (ref 22–32)
Calcium: 9.1 mg/dL (ref 8.9–10.3)
Chloride: 89 mmol/L — ABNORMAL LOW (ref 98–111)
Creatinine, Ser: 0.6 mg/dL — ABNORMAL LOW (ref 0.61–1.24)
GFR, Estimated: >60 mL/min (ref >60)
Glucose, Bld: 156 mg/dL — ABNORMAL HIGH (ref 70–99)
Potassium: 4 mmol/L (ref 3.5–5.1)
Sodium: 139 mmol/L (ref 135–145)
Total Bilirubin: 0.6 mg/dL (ref 0.0–1.2)
Total Protein: 6.4 g/dL — ABNORMAL LOW (ref 6.5–8.1)

## 2024-01-13 LAB — RESP PANEL BY RT-PCR (RSV, FLU A&B, COVID)  RVPGX2
Influenza A by PCR: NEGATIVE
Influenza B by PCR: NEGATIVE
Resp Syncytial Virus by PCR: NEGATIVE
SARS Coronavirus 2 by RT PCR: NEGATIVE

## 2024-01-13 LAB — I-STAT VENOUS BLOOD GAS, ED
Acid-Base Excess: 20 mmol/L — ABNORMAL HIGH (ref 0.0–2.0)
Bicarbonate: 47.6 mmol/L — ABNORMAL HIGH (ref 20.0–28.0)
Calcium, Ion: 1.06 mmol/L — ABNORMAL LOW (ref 1.15–1.40)
HCT: 36 % — ABNORMAL LOW (ref 39.0–52.0)
Hemoglobin: 12.2 g/dL — ABNORMAL LOW (ref 13.0–17.0)
O2 Saturation: 50 %
Potassium: 4.4 mmol/L (ref 3.5–5.1)
Sodium: 140 mmol/L (ref 135–145)
TCO2: 50 mmol/L — ABNORMAL HIGH (ref 22–32)
pCO2, Ven: 70.6 mmHg (ref 44–60)
pH, Ven: 7.437 — ABNORMAL HIGH (ref 7.25–7.43)
pO2, Ven: 27 mmHg — CL (ref 32–45)

## 2024-01-13 LAB — CBC WITH DIFFERENTIAL/PLATELET
Abs Immature Granulocytes: 0.01 K/uL (ref 0.00–0.07)
Basophils Absolute: 0 K/uL (ref 0.0–0.1)
Basophils Relative: 1 %
Eosinophils Absolute: 0.1 K/uL (ref 0.0–0.5)
Eosinophils Relative: 1 %
HCT: 36.4 % — ABNORMAL LOW (ref 39.0–52.0)
Hemoglobin: 11.1 g/dL — ABNORMAL LOW (ref 13.0–17.0)
Immature Granulocytes: 0 %
Lymphocytes Relative: 10 %
Lymphs Abs: 0.6 K/uL — ABNORMAL LOW (ref 0.7–4.0)
MCH: 32.7 pg (ref 26.0–34.0)
MCHC: 30.5 g/dL (ref 30.0–36.0)
MCV: 107.4 fL — ABNORMAL HIGH (ref 80.0–100.0)
Monocytes Absolute: 0.3 K/uL (ref 0.1–1.0)
Monocytes Relative: 5 %
Neutro Abs: 4.7 K/uL (ref 1.7–7.7)
Neutrophils Relative %: 83 %
Platelets: 131 K/uL — ABNORMAL LOW (ref 150–400)
RBC: 3.39 MIL/uL — ABNORMAL LOW (ref 4.22–5.81)
RDW: 12.3 % (ref 11.5–15.5)
WBC: 5.7 K/uL (ref 4.0–10.5)
nRBC: 0 % (ref 0.0–0.2)

## 2024-01-13 MED ORDER — IPRATROPIUM-ALBUTEROL 0.5-2.5 (3) MG/3ML IN SOLN
3.0000 mL | Freq: Once | RESPIRATORY_TRACT | Status: AC
  Administered 2024-01-13: 3 mL via RESPIRATORY_TRACT
  Filled 2024-01-13: qty 3

## 2024-01-13 MED ORDER — ONDANSETRON HCL 4 MG PO TABS: 4.0000 mg | ORAL_TABLET | Freq: Four times a day (QID) | ORAL | Status: DC | PRN

## 2024-01-13 MED ORDER — ALBUTEROL SULFATE (2.5 MG/3ML) 0.083% IN NEBU: 2.5000 mg | INHALATION_SOLUTION | RESPIRATORY_TRACT | Status: DC | PRN

## 2024-01-13 MED ORDER — ACETAMINOPHEN 325 MG PO TABS: 650.0000 mg | ORAL_TABLET | Freq: Four times a day (QID) | ORAL | Status: DC | PRN

## 2024-01-13 MED ORDER — SODIUM CHLORIDE 0.9 % IV SOLN
1.0000 g | INTRAVENOUS | Status: DC
  Administered 2024-01-13: 1 g via INTRAVENOUS
  Filled 2024-01-13: qty 10

## 2024-01-13 MED ORDER — ONDANSETRON HCL 4 MG/2ML IJ SOLN
4.0000 mg | Freq: Four times a day (QID) | INTRAMUSCULAR | Status: DC | PRN
Start: 2024-01-13 — End: 2024-01-15

## 2024-01-13 MED ORDER — IPRATROPIUM BROMIDE 0.02 % IN SOLN
0.5000 mg | Freq: Once | RESPIRATORY_TRACT | Status: AC
  Administered 2024-01-13: 0.5 mg via RESPIRATORY_TRACT
  Filled 2024-01-13: qty 2.5

## 2024-01-13 MED ORDER — ACETAMINOPHEN 650 MG RE SUPP: 650.0000 mg | Freq: Four times a day (QID) | RECTAL | Status: DC | PRN

## 2024-01-13 MED ORDER — ALBUTEROL SULFATE (2.5 MG/3ML) 0.083% IN NEBU
10.0000 mg/h | INHALATION_SOLUTION | Freq: Once | RESPIRATORY_TRACT | Status: AC
  Administered 2024-01-13: 10 mg/h via RESPIRATORY_TRACT
  Filled 2024-01-13: qty 12

## 2024-01-13 MED ORDER — HEPARIN SODIUM (PORCINE) 5000 UNIT/ML IJ SOLN
5000.0000 [IU] | Freq: Three times a day (TID) | INTRAMUSCULAR | Status: DC
  Administered 2024-01-13 – 2024-01-15 (×6): 5000 [IU] via SUBCUTANEOUS
  Filled 2024-01-13 (×6): qty 1

## 2024-01-13 MED ORDER — METHYLPREDNISOLONE SODIUM SUCC 125 MG IJ SOLR
125.0000 mg | Freq: Once | INTRAMUSCULAR | Status: AC
  Administered 2024-01-13: 125 mg via INTRAVENOUS
  Filled 2024-01-13: qty 2

## 2024-01-13 MED ORDER — PREDNISONE 10 MG PO TABS
40.0000 mg | ORAL_TABLET | Freq: Every day | ORAL | Status: DC
  Administered 2024-01-14 – 2024-01-15 (×2): 40 mg via ORAL
  Filled 2024-01-13 (×2): qty 4

## 2024-01-13 MED ORDER — ALBUTEROL SULFATE (2.5 MG/3ML) 0.083% IN NEBU
5.0000 mg | INHALATION_SOLUTION | Freq: Once | RESPIRATORY_TRACT | Status: AC
  Administered 2024-01-13: 5 mg via RESPIRATORY_TRACT
  Filled 2024-01-13: qty 6

## 2024-01-13 MED ORDER — IPRATROPIUM-ALBUTEROL 0.5-2.5 (3) MG/3ML IN SOLN
3.0000 mL | Freq: Four times a day (QID) | RESPIRATORY_TRACT | Status: DC
  Administered 2024-01-13 – 2024-01-15 (×6): 3 mL via RESPIRATORY_TRACT
  Filled 2024-01-13 (×6): qty 3

## 2024-01-13 MED ORDER — SENNOSIDES-DOCUSATE SODIUM 8.6-50 MG PO TABS: 1.0000 | ORAL_TABLET | Freq: Every evening | ORAL | Status: DC | PRN

## 2024-01-13 NOTE — ED Notes (Signed)
 Awaiting patient from lobby.

## 2024-01-13 NOTE — ED Provider Notes (Signed)
 [place] EMERGENCY DEPARTMENT AT [facility] Provider Note   CSN: [id number] Arrival date & time: 01/13/24  1301     Patient presents with: Shortness of Breath   Anthony Nelson is a 63 y.o. male.  Presents with 3 days of worsening malaise and increased work of breathing.  His home health nurse called EMS due to concern about his shortness of breath.  Patient states that he overall feels weak/fatigued, has been satting overall well on his baseline 3-4 L at home but feels like he is too tired to take deep enough breaths.  Denies any new cough or recent fever/chills.  He also notes increased swelling of both lower extremities but states that they have been like that for at least a week.  States that sometimes he does not take his Lasix  because it causes him to have increased urinary urgency for which he often cannot get to the bathroom in time. Per EMS was hypercapnic to 60, received 1 DuoNeb per EMS.  Upon arrival to ED received Solu-Medrol  and 1X DuoNeb per triage prior to my evaluation.    Prior to Admission medications   Medication Sig Start Date End Date Taking? Authorizing Provider  albuterol  (VENTOLIN  HFA) 108 (90 Base) MCG/ACT inhaler Inhale 2 puffs into the lungs every 6 hours as needed for wheezing or shortness of breath. 07/12/23  Yes Jillian Buttery, MD  aspirin  EC 81 MG tablet Take 81 mg by mouth daily. Swallow whole.   Yes [provider]  Fluticasone -Umeclidin-Vilant (TRELEGY ELLIPTA ) 200-62.5-25 MCG/ACT AEPB Inhale 1 puff into the lungs in the morning and at bedtime.   Yes [provider]  GINSENG PO Take 1 capsule by mouth daily.   Yes [provider]  Multiple Vitamins-Minerals (MULTIVITAMIN WITH MINERALS) tablet Take 1 tablet by mouth daily.   Yes [provider]  Nutritional Supplements (ENSURE PO) Take 1 Bottle by mouth 2 (two) times daily as needed (poor appetite).   Yes [provider]  OXYGEN  Inhale into the lungs  continuous.   Yes [provider]  VITAMIN E PO Take 1 capsule by mouth daily.   Yes [provider]  furosemide  (LASIX ) 20 MG tablet Take 20 mg by mouth daily. Patient not taking: Reported on 01/14/2024    [provider]  rosuvastatin  (CRESTOR ) 40 MG tablet Take 40 mg by mouth daily. Patient not taking: Reported on 01/14/2024    [provider]    Allergies: Erythromycin     Updated Vital Signs BP 105/74   Pulse 77   Temp 98 F (36.7 C) (Oral)   Resp 18   Ht 6' (1.829 m)   Wt 47.1 kg   SpO2 97%   BMI 14.08 kg/m   Physical Exam Vitals reviewed.  Constitutional:      General: He is not in acute distress.    Appearance: He is cachectic. He is not toxic-appearing or diaphoretic.  HENT:     Head: Normocephalic and atraumatic.     Nose: Nose normal. No rhinorrhea.     Mouth/Throat:     Mouth: Mucous membranes are moist.     Pharynx: Oropharynx is clear.  Eyes:     Extraocular Movements: Extraocular movements intact.     Pupils: Pupils are equal, round, and reactive to light.  Cardiovascular:     Rate and Rhythm: Normal rate and regular rhythm.     Pulses: Normal pulses.     Heart sounds: No murmur heard.  No gallop.  Pulmonary:     Comments: Overall diminished aeration of all lung fields, faint expiratory wheezing throughout, no appreciable crackles Chest:     Chest wall: No tenderness.  Abdominal:     General: Abdomen is flat.     Palpations: Abdomen is soft.     Tenderness: There is no abdominal tenderness.  Musculoskeletal:        General: No deformity. Normal range of motion.     Cervical back: Normal range of motion and neck supple. No rigidity or tenderness.     Right lower leg: Edema (2+) present.     Left lower leg: Edema (2+) present.  Skin:    General: Skin is warm and dry.     Capillary Refill: Capillary refill takes less than 2 seconds.     Coloration: Skin is not jaundiced.  Neurological:     General: No focal  deficit present.     Mental Status: He is alert and oriented to person, place, and time.     (all labs ordered are listed, but only abnormal results are displayed) Labs Reviewed  CBC WITH DIFFERENTIAL/PLATELET - Abnormal; Notable for the following components:      Result Value   RBC 3.39 (*)    Hemoglobin 11.1 (*)    HCT 36.4 (*)    MCV 107.4 (*)    Platelets 131 (*)    Lymphs Abs 0.6 (*)    All other components within normal limits  COMPREHENSIVE METABOLIC PANEL WITH GFR - Abnormal; Notable for the following components:   Chloride 89 (*)    CO2 40 (*)    Glucose, Bld 156 (*)    Creatinine, Ser 0.60 (*)    Total Protein 6.4 (*)    All other components within normal limits  BASIC METABOLIC PANEL WITH GFR - Abnormal; Notable for the following components:   Chloride 90 (*)    CO2 38 (*)    Creatinine, Ser 0.59 (*)    All other components within normal limits  CBC - Abnormal; Notable for the following components:   RBC 3.02 (*)    Hemoglobin 9.8 (*)    HCT 31.1 (*)    MCV 103.0 (*)    Platelets 144 (*)    All other components within normal limits  I-STAT VENOUS BLOOD GAS, ED - Abnormal; Notable for the following components:   pH, Ven 7.437 (*)    pCO2, Ven 70.6 (*)    pO2, Ven 27 (*)    Bicarbonate 47.6 (*)    TCO2 50 (*)    Acid-Base Excess 20.0 (*)    Calcium , Ion 1.06 (*)    HCT 36.0 (*)    Hemoglobin 12.2 (*)    All other components within normal limits  RESP PANEL BY RT-PCR (RSV, FLU A&B, COVID)  RVPGX2  BRAIN NATRIURETIC PEPTIDE  HIV ANTIBODY (ROUTINE TESTING W REFLEX)    EKG: EKG Interpretation Date/Time:  Monday January 13 2024 13:19:40 EDT Ventricular Rate:  87 PR Interval:  132 QRS Duration:  96 QT Interval:  364 QTC Calculation: 438 R Axis:   102  Text Interpretation: Normal sinus rhythm Rightward axis Pulmonary disease pattern Abnormal ECG When compared with ECG of 15-Nov-2023 13:02, PREVIOUS ECG IS PRESENT Confirmed by Ruthe Cornet [id number]) on  01/13/2024 4:59:05 PM  Radiology: US  Abdomen Complete Result Date: 01/14/2024 CLINICAL DATA:  29481 Abdominal pulsatile mass 29481 EXAM: ABDOMEN ULTRASOUND COMPLETE COMPARISON:  July 07, 2023 FINDINGS: Gallbladder: No gallstones or wall  thickening visualized. No sonographic Murphy sign noted by sonographer. Common bile duct: Diameter: Visualized portion measures 4 mm, within normal limits. Liver: No focal lesion identified. Within normal limits in parenchymal echogenicity. Portal vein is patent on color Doppler imaging with normal direction of blood flow towards the liver. IVC: No abnormality visualized. Pancreas: Visualized portion unremarkable. Spleen: Size and appearance within normal limits. Portions are suboptimally assessed secondary to shadowing bowel gas. Right Kidney: Length: 12.3 cm. Echogenicity within normal limits. No mass or hydronephrosis visualized. Left Kidney: Length: 11.8 cm. Echogenicity within normal limits. No mass or hydronephrosis visualized. Abdominal aorta: No aneurysm visualized. Portions are suboptimally assessed secondary to shadowing bowel gas. Atherosclerosis. Other findings: None. IMPRESSION: No sonographic etiology for pulsatile visualized. Electronically Signed   By: Corean Salter M.D.   On: 01/14/2024 07:51   DG Chest 2 View Result Date: 01/13/2024 CLINICAL DATA:  Shortness of breath EXAM: CHEST - 2 VIEW COMPARISON:  Chest x-ray 11/15/2023 FINDINGS: The lungs are hyperinflated compatible with emphysema, unchanged. There is no focal lung infiltrate, pleural effusion or pneumothorax. The cardiomediastinal silhouette is within normal limits. No acute fractures are seen. IMPRESSION: 1. No active cardiopulmonary disease. 2. Emphysema. Electronically Signed   By: Greig Pique M.D.   On: 01/13/2024 15:11     Medications Ordered in the ED  heparin  injection 5,000 Units (5,000 Units Subcutaneous Given 01/14/24 0648)  acetaminophen  (TYLENOL ) tablet 650 mg (has no  administration in time range)    Or  acetaminophen  (TYLENOL ) suppository 650 mg (has no administration in time range)  ondansetron  (ZOFRAN ) tablet 4 mg (has no administration in time range)    Or  ondansetron  (ZOFRAN ) injection 4 mg (has no administration in time range)  senna-docusate (Senokot-S) tablet 1 tablet (has no administration in time range)  ipratropium-albuterol  (DUONEB) 0.5-2.5 (3) MG/3ML nebulizer solution 3 mL (3 mLs Nebulization Given 01/14/24 1153)  albuterol  (PROVENTIL ) (2.5 MG/3ML) 0.083% nebulizer solution 2.5 mg (has no administration in time range)  cefTRIAXone  (ROCEPHIN ) 1 g in sodium chloride  0.9 % 100 mL IVPB (0 g Intravenous Stopped 01/13/24 2159)  predniSONE  (DELTASONE ) tablet 40 mg (40 mg Oral Given 01/14/24 0825)  feeding supplement (ENSURE PLUS HIGH PROTEIN) liquid 237 mL (has no administration in time range)  multivitamin with minerals tablet 1 tablet (1 tablet Oral Given 01/14/24 1158)  ipratropium (ATROVENT ) nebulizer solution 0.5 mg (0.5 mg Nebulization Given 01/13/24 1356)  albuterol  (PROVENTIL ) (2.5 MG/3ML) 0.083% nebulizer solution 5 mg (5 mg Nebulization Given 01/13/24 1356)  methylPREDNISolone  sodium succinate (SOLU-MEDROL ) 125 mg/2 mL injection 125 mg (125 mg Intravenous Given 01/13/24 1354)  ipratropium-albuterol  (DUONEB) 0.5-2.5 (3) MG/3ML nebulizer solution 3 mL (3 mLs Nebulization Given 01/13/24 1745)  albuterol  (PROVENTIL ) (2.5 MG/3ML) 0.083% nebulizer solution (10 mg/hr Nebulization Given 01/13/24 1945)    Clinical Course as of 01/14/24 1448  Mon Jan 13, 2024  1706 DG Chest 2 View No active cardiopulmonary disease. Emphysema.   [AD]  1725 pCO2, Ven(!!): 70.6 Will initiate repeat breathing treatment, no urgent indication for BiPAP at this time given normal WOB and metabolic compensation with mild alkalosis to 7.44 [AD]  1740 CBC with Differential/Platelet(!) c/w baseline macrocytic anemia, no leukocytosis [AD]    Clinical Course User Index [AD]  Raoul Rake, MD   Medical Decision Making Patient with the above history is presenting with 3 days of progressive SOB/dyspnea on minimal exertion and now at rest. 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  He is satting well on his baseline Reader but has very diminished breath sounds throughout on exam with faint wheezing where you can hear breath sounds. He endorses generalized malaise but no recent fevers/chills or new cough. Suspect patient's malaise/weakness is from increased WOB/exhaustion over the last few days, probable hypercapnic respiratory failure, and what appears to be severe protein calorie malnutrition with cachexia on exam. Will give additional neb and reassess, and initiate broad workup for COPD exacerbation. Patient already s/p 2x nebs and solumedrol dose.   As above, patient's workup is overall without signs of infection or other provoking/acute findings - he does have hypercapnia of 70 on initial VBG (after 2x nebs PTA), though appears to be compensating well with pH 7.44 and elevated bicarb, so Bipap was not emergently initiated. Given that patient lives alone and has complicated/severe COPD history, will admit to hospitalist service for further observation/management of his COPD exacerbation.  Amount and/or Complexity of Data Reviewed Labs: ordered. Decision-making details documented in ED Course. Radiology: ordered. Decision-making details documented in ED Course. ECG/medicine tests: ordered.  Risk Prescription drug management. Decision regarding hospitalization.     Final diagnoses:  COPD exacerbation [facility])    ED Discharge Orders     None          Raoul Rake, MD 01/14/24 1448    Ruthe Cornet, DO 01/14/24 1520

## 2024-01-13 NOTE — ED Notes (Signed)
CCMD notified

## 2024-01-13 NOTE — ED Triage Notes (Signed)
 PT arrived via [facility] from home, called by home health RN for increasing shortness of breath x3 days. 95% on 4L baseline at home, diminished, hypercapnic 60 for EMS, exertional SOB. Duoneb administered, 20g LAC. EKG unremarkable. BP 111/70, HR 90 NS, RR 24-> 16, BS 151

## 2024-01-13 NOTE — ED Provider Triage Note (Signed)
 Emergency Medicine Provider Triage Evaluation Note  Anthony Nelson , a 63 y.o. male  was evaluated in triage.  Pt complains of sob that has worsening over the last 1 week.  Cough that is non-productive.  Pt with swelling in the legs as well but has not taken lasix  in about 2 weeks b/c it causes him to go to the bathroom too much.  Inhalers used at home with minimal improvement.  On 3.5-4L chronically.  Review of Systems  Positive: Sob, cough, leg swelling Negative: No Chest or abd pain  Physical Exam  BP 130/79 (BP Location: Left Arm)   Pulse 91   Temp 98.3 F (36.8 C)   Resp 18   SpO2 96%  Gen:   Awake, no distress   Resp:  Globally decreased breath sounds.  MSK:   Moves extremities without difficulty, bilateral pitting edema Other:  No abd pain  Medical Decision Making  Medically screening exam initiated at 1:17 PM.  Appropriate orders placed.  Anthony Nelson was informed that the remainder of the evaluation will be completed by another provider, this initial triage assessment does not replace that evaluation, and the importance of remaining in the ED until their evaluation is complete.  Pt with COPD and CHF not taking lasix  here with sOb.  Concern for exacerbation or either or possible both.  Nebs, steroids and labs sent    Doretha Folks, MD 01/13/24 1319

## 2024-01-13 NOTE — H&P (Addendum)
 History and Physical    Patient: Anthony Nelson FMW:[id number] DOB: [birth year] DOA: 01/13/2024 DOS: the patient was seen and examined on 01/13/2024 PCP: [facility]  Patient coming from: Homeless  Chief Complaint:  Chief Complaint  Patient presents with   Shortness of Breath   HPI: Anthony Nelson is a 63 y.o. male with medical history significant for COPD on chronic 3 L O2 [place] at home pulmonary hypertension, diastolic heart failure and severe protein calorie malnutrition who presents with severe shortness of breath earlier today.  He says his caregiver is the one who called EMS.  The patient says he has been more short of breath probably for about a week now but it abruptly got better today.  He is a very poor historian not able to provide details. He does deny fevers, chills, diarrhea, or chest pain. The patient has noted to have been homeless in the past but he tells me he lives in a house and has a caregiver who comes to the house 3-4 times a week.  He says she gives him all his medication.  And she was the one who noted how short of breath he was today and called EMS. He uses a scooter to get around because of his breathing. His chart review reveals that most of the time of the patient admitted he is on BiPAP.  He was intubated during his last hospitalization 5 months ago.   He followed w Dr Belvie Silvan, pccm.   Review of Systems: As mentioned in the history of present illness. All other systems reviewed and are negative. 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 His smoking use included cigarettes. He started smoking about 47 years ago. He has a 4.4 pack-year smoking history. He has never used smokeless tobacco. He reports that he does not currently use alcohol. He reports that he does not use drugs.  Allergies  Allergen Reactions   Erythromycin Other (See Comments)    Allergy from childhood- does not take it    Family History  Problem Relation Age of Onset   Cancer Mother    Cancer Father    CAD Father    Heart failure Father    Diabetes Maternal Grandmother    Kidney disease Maternal Grandmother    Kidney cancer Paternal Grandmother    Heart attack Paternal Grandfather     Prior to Admission medications   Medication Sig Start Date End Date Taking? Authorizing Provider  albuterol  (VENTOLIN  HFA) 108 (90 Base) MCG/ACT inhaler Inhale 2 puffs into the lungs every 6 hours as needed for wheezing or shortness of breath. 07/12/23   Jillian Buttery, MD  ALPRAZolam  (XANAX ) 0.25 MG tablet Take 1 tablet (0.25 mg total) by mouth 3 (three) times daily as needed for anxiety. 07/12/23   Jillian Buttery, MD  docusate sodium  (COLACE) 100 MG capsule Take 1 capsule (100 mg total) by mouth 2 (two) times daily. 05/09/23   Donnamarie Lebron PARAS, MD  doxycycline  (VIBRAMYCIN )  100 MG capsule Take 1 capsule (100 mg total) by mouth 2 (two) times daily for 7 days . 10/03/23   Zammit, Joseph, MD  famotidine  (PEPCID ) 20 MG tablet Take 1 tablet (20 mg total) by mouth 2 (two) times daily. 05/09/23 07/08/23  Ezenduka, Nkeiruka J, MD  furosemide  (LASIX ) 20 MG tablet Take 1 tablet (20 mg total) by mouth daily as needed. Please make primary care provider appointment. 07/26/23 07/25/24  Newlin, Enobong, MD  GINSENG PO Take 1 capsule by mouth daily. Patient not taking: Reported on 07/08/2023    [provider]  guaiFENesin  (MUCINEX ) 600 MG 12 hr tablet Take 1 tablet (600 mg total) by mouth 2 (two) times daily as  needed for cough or to loosen phlegm. 07/12/23   Jillian Buttery, MD  ipratropium-albuterol  (DUONEB) 0.5-2.5 (3) MG/3ML SOLN Take 3 mLs by nebulization every 2 (two) hours as needed. 07/12/23   Adhikari, Amrit, MD  Multiple Vitamins-Minerals (MULTIVITAMIN WITH MINERALS) tablet Take 1 tablet by mouth daily with breakfast.    [provider]  OXYGEN  Inhale 2 L/min into the lungs continuous.     [provider]  predniSONE  (DELTASONE ) 10 MG tablet Take 4 tablets (40 mg total) by mouth daily. 11/15/23   Zackowski, Scott, MD  rosuvastatin  (CRESTOR ) 40 MG tablet Take 1 tablet (40 mg total) by mouth daily. Must have office visit for refills 07/29/23   Delbert Clam, MD  sodium chloride  (OCEAN) 0.65 % SOLN nasal spray Place 2 sprays into both nostrils in the morning, at noon, and at bedtime. Patient taking differently: Place 2 sprays into both nostrils 3 (three) times daily as needed for congestion. 12/05/20   Brien Belvie BRAVO, MD  VITAMIN E PO Take 1 capsule by mouth daily.    [provider]    Physical Exam: Vitals:   01/13/24 1704 01/13/24 1705 01/13/24 1715 01/13/24 1800  BP: 132/76  115/80   Pulse: 77  74 93  Resp: 16   12  Temp: 98.3 F (36.8 C)     TempSrc: Oral     SpO2: 97% 92% 100% 100%   Physical Exam:  General: No acute distress, disheveled and cachectic HEENT: Normocephalic, atraumatic, PERRL Cardiovascular: Normal rate and rhythm. Distal pulses intact. Pulmonary: Almost absent breath sounds posteriorly.  No wheezing appreciated currently. Gastrointestinal: Nondistended abdomen, soft, non-tender, hypoactive bowel sounds, scaphoid abdomen, pulsatile abdominal mass on right Musculoskeletal:swelling of the feet bilaterally Skin: Skin is warm and dry. Neuro: No focal deficits noted, alert but poor memory. He cannot recall what surgery he has had or if his weight is stable or not. PSYCH: Attentive and cooperative  Data Reviewed:  Results for orders placed  or performed during the hospital encounter of 01/13/24 (from the past 24 hours)  CBC with Differential/Platelet     Status: Abnormal   Collection Time: 01/13/24  1:17 PM  Result Value Ref Range   WBC 5.7 4.0 - 10.5 K/uL   RBC 3.39 (L) 4.22 - 5.81 MIL/uL   Hemoglobin 11.1 (L) 13.0 - 17.0 g/dL   HCT 63.5 (L) 60.9 - 47.9 %   MCV 107.4 (H) 80.0 - 100.0 fL   MCH 32.7 26.0 - 34.0 pg   MCHC 30.5 30.0 - 36.0 g/dL   RDW 87.6 88.4 - 84.4 %   Platelets 131 (L) 150 - 400 K/uL   nRBC 0.0 0.0 - 0.2 %   Neutrophils Relative % 83 %   Neutro Abs 4.7 1.7 - 7.7 K/uL  Lymphocytes Relative 10 %   Lymphs Abs 0.6 (L) 0.7 - 4.0 K/uL   Monocytes Relative 5 %   Monocytes Absolute 0.3 0.1 - 1.0 K/uL   Eosinophils Relative 1 %   Eosinophils Absolute 0.1 0.0 - 0.5 K/uL   Basophils Relative 1 %   Basophils Absolute 0.0 0.0 - 0.1 K/uL   Immature Granulocytes 0 %   Abs Immature Granulocytes 0.01 0.00 - 0.07 K/uL  Comprehensive metabolic panel with GFR     Status: Abnormal   Collection Time: 01/13/24  1:17 PM  Result Value Ref Range   Sodium 139 135 - 145 mmol/L   Potassium 4.0 3.5 - 5.1 mmol/L   Chloride 89 (L) 98 - 111 mmol/L   CO2 40 (H) 22 - 32 mmol/L   Glucose, Bld 156 (H) 70 - 99 mg/dL   BUN 17 8 - 23 mg/dL   Creatinine, Ser 9.39 (L) 0.61 - 1.24 mg/dL   Calcium  9.1 8.9 - 10.3 mg/dL   Total Protein 6.4 (L) 6.5 - 8.1 g/dL   Albumin 3.6 3.5 - 5.0 g/dL   AST 19 15 - 41 U/L   ALT 10 0 - 44 U/L   Alkaline Phosphatase 41 38 - 126 U/L   Total Bilirubin 0.6 0.0 - 1.2 mg/dL   GFR, Estimated >39 >39 mL/min   Anion gap 10 5 - 15  Brain natriuretic peptide     Status: None   Collection Time: 01/13/24  2:00 PM  Result Value Ref Range   B Natriuretic Peptide 21.0 0.0 - 100.0 pg/mL  I-Stat venous blood gas, ([facility], [facility], [facility])     Status: Abnormal   Collection Time: 01/13/24  5:23 PM  Result Value Ref Range   pH, Ven 7.437 (H) 7.25 - 7.43   pCO2, Ven 70.6 (HH) 44 - 60 mmHg   pO2, Ven 27 (LL) 32 - 45 mmHg    Bicarbonate 47.6 (H) 20.0 - 28.0 mmol/L   TCO2 50 (H) 22 - 32 mmol/L   O2 Saturation 50 %   Acid-Base Excess 20.0 (H) 0.0 - 2.0 mmol/L   Sodium 140 135 - 145 mmol/L   Potassium 4.4 3.5 - 5.1 mmol/L   Calcium , Ion 1.06 (L) 1.15 - 1.40 mmol/L   HCT 36.0 (L) 39.0 - 52.0 %   Hemoglobin 12.2 (L) 13.0 - 17.0 g/dL   Sample type VENOUS    Comment NOTIFIED PHYSICIAN      Assessment and Plan: COPD Exacerbation -continue nebs, steroids, antibiotics per protocol. - His past records revealed that he used to be on BiPAP at night which we will continue but it is not clear that he is still using that at home. - Continue 3L O2 [place]  2. Severe protein calorie malnutrition - Dietician consult.  He is not able to offer any insight or reliable history.  3. Pulsatile area in right abdomen - Check ultrasound   Advance Care Planning:   Code Status: Prior the patient names his daughter-in-law as a Runner, broadcasting/film/video and he wants to be full code.  Consults: none  Family Communication: none  Severity of Illness: The appropriate patient status for this patient is INPATIENT. Inpatient status is judged to be reasonable and necessary in order to provide the required intensity of service to ensure the patient's safety. The patient's presenting symptoms, physical exam findings, and initial radiographic and laboratory data in the context of their chronic comorbidities is felt to place them at high risk for further  clinical deterioration. Furthermore, it is not anticipated that the patient will be medically stable for discharge from the hospital within 2 midnights of admission.   * I certify that at the point of admission it is my clinical judgment that the patient will require inpatient hospital care spanning beyond 2 midnights from the point of admission due to high intensity of service, high risk for further deterioration and high frequency of surveillance required.*  Author: ARTHEA CHILD,  MD 01/13/2024 6:46 PM  For on call review [email or web address].

## 2024-01-13 NOTE — ED Provider Notes (Signed)
 Super vies resident visit.  Patient with history of COPD on 3 L of oxygen .  Increased work of breathing and shortness of breath the last couple days.  Breathing treatment steroids, with EMS.  He is not having any chest pain.  No concern for PE.  Wheezing throughout on exam.  Poor air movement.  He is tachypneic.  pH is 7.37.  CO2 is 70.  No significant leukocytosis.  BNP is normal.  EKG shows sinus rhythm.  Chest x-ray no evidence of pneumonia.  I reviewed interpreted labs and imaging.  Ultimately after breathing treatments I still think that he has increased work of breathing.  I think he benefit from observation stay for further supportive care make sure he does not need BiPAP or gets worse.  He lives by himself.  I think safest dispo is to watch him overnight.  He is admitted to hospital service.  This chart was dictated using voice recognition software.  Despite best efforts to proofread,  errors can occur which can change the documentation meaning.    Ruthe Cornet, DO 01/13/24 1836

## 2024-01-13 NOTE — ED Notes (Signed)
 RT called to place patient on continuous nebulizer.

## 2024-01-13 NOTE — ED Notes (Addendum)
 Called floor for report and floor ready for patient.

## 2024-01-13 NOTE — ED Notes (Signed)
 MD Claborne at bedside

## 2024-01-14 ENCOUNTER — Inpatient Hospital Stay (HOSPITAL_COMMUNITY)

## 2024-01-14 ENCOUNTER — Other Ambulatory Visit: Payer: Self-pay

## 2024-01-14 DIAGNOSIS — J9611 Chronic respiratory failure with hypoxia: Secondary | ICD-10-CM | POA: Diagnosis not present

## 2024-01-14 DIAGNOSIS — I2781 Cor pulmonale (chronic): Secondary | ICD-10-CM | POA: Diagnosis not present

## 2024-01-14 DIAGNOSIS — J9612 Chronic respiratory failure with hypercapnia: Secondary | ICD-10-CM

## 2024-01-14 DIAGNOSIS — J441 Chronic obstructive pulmonary disease with (acute) exacerbation: Secondary | ICD-10-CM | POA: Diagnosis not present

## 2024-01-14 DIAGNOSIS — E43 Unspecified severe protein-calorie malnutrition: Secondary | ICD-10-CM | POA: Diagnosis not present

## 2024-01-14 LAB — BASIC METABOLIC PANEL WITH GFR
Anion gap: 10 (ref 5–15)
BUN: 17 mg/dL (ref 8–23)
CO2: 38 mmol/L — ABNORMAL HIGH (ref 22–32)
Calcium: 9 mg/dL (ref 8.9–10.3)
Chloride: 90 mmol/L — ABNORMAL LOW (ref 98–111)
Creatinine, Ser: 0.59 mg/dL — ABNORMAL LOW (ref 0.61–1.24)
GFR, Estimated: >60 mL/min (ref >60)
Glucose, Bld: 91 mg/dL (ref 70–99)
Potassium: 3.9 mmol/L (ref 3.5–5.1)
Sodium: 138 mmol/L (ref 135–145)

## 2024-01-14 LAB — CBC
HCT: 31.1 % — ABNORMAL LOW (ref 39.0–52.0)
Hemoglobin: 9.8 g/dL — ABNORMAL LOW (ref 13.0–17.0)
MCH: 32.5 pg (ref 26.0–34.0)
MCHC: 31.5 g/dL (ref 30.0–36.0)
MCV: 103 fL — ABNORMAL HIGH (ref 80.0–100.0)
Platelets: 144 K/uL — ABNORMAL LOW (ref 150–400)
RBC: 3.02 MIL/uL — ABNORMAL LOW (ref 4.22–5.81)
RDW: 12.3 % (ref 11.5–15.5)
WBC: 6.3 K/uL (ref 4.0–10.5)
nRBC: 0 % (ref 0.0–0.2)

## 2024-01-14 LAB — HIV ANTIBODY (ROUTINE TESTING W REFLEX): HIV Screen 4th Generation wRfx: NONREACTIVE

## 2024-01-14 MED ORDER — ROSUVASTATIN CALCIUM 20 MG PO TABS
40.0000 mg | ORAL_TABLET | Freq: Every day | ORAL | Status: DC
  Administered 2024-01-14 – 2024-01-15 (×2): 40 mg via ORAL
  Filled 2024-01-14 (×2): qty 2

## 2024-01-14 MED ORDER — NEPRO/CARBSTEADY PO LIQD
237.0000 mL | Freq: Two times a day (BID) | ORAL | Status: DC
  Administered 2024-01-15 (×2): 237 mL via ORAL

## 2024-01-14 MED ORDER — ASPIRIN 81 MG PO TBEC
81.0000 mg | DELAYED_RELEASE_TABLET | Freq: Every day | ORAL | Status: DC
  Administered 2024-01-14 – 2024-01-15 (×2): 81 mg via ORAL
  Filled 2024-01-14 (×2): qty 1

## 2024-01-14 MED ORDER — ADULT MULTIVITAMIN W/MINERALS CH
1.0000 | ORAL_TABLET | Freq: Every day | ORAL | Status: DC
  Administered 2024-01-14 – 2024-01-15 (×2): 1 via ORAL
  Filled 2024-01-14: qty 1

## 2024-01-14 MED ORDER — ENSURE PLUS HIGH PROTEIN PO LIQD
237.0000 mL | Freq: Three times a day (TID) | ORAL | Status: DC
  Administered 2024-01-14: 237 mL via ORAL

## 2024-01-14 MED ORDER — DOXYCYCLINE HYCLATE 100 MG PO TABS
100.0000 mg | ORAL_TABLET | Freq: Two times a day (BID) | ORAL | Status: DC
  Administered 2024-01-14 – 2024-01-15 (×2): 100 mg via ORAL
  Filled 2024-01-14 (×2): qty 1

## 2024-01-14 MED ORDER — FUROSEMIDE 40 MG PO TABS
20.0000 mg | ORAL_TABLET | Freq: Every day | ORAL | Status: DC
  Administered 2024-01-14 – 2024-01-15 (×2): 20 mg via ORAL
  Filled 2024-01-14 (×2): qty 1

## 2024-01-14 NOTE — TOC Initial Note (Addendum)
 Transition of Care [facility]) - Initial/Assessment Note    Patient Details  Name: Anthony Nelson MRN: [id number] Date of Birth: 03-24-1961  [facility]) CM/SW Contact:    Roxie KANDICE Stain, RN Phone Number: 01/14/2024, 1:08 PM  Clinical Narrative:                 Spoke to patient regarding transition needs.  Patient lives alone and has PCS 29 hrs a week. [facility].  Patient has family support. Patient follows up with [facility] for PCP. Patient has rollator and scooter. [place] access application sent.  TOC will follow for PT, OT consult.   Need home health PT, OT ,RN resumption orders.  Expected Discharge Plan: Home w Home Health Services Barriers to Discharge: Continued Medical Work up   Patient Goals and CMS Choice Patient states their goals for this hospitalization and ongoing recovery are:: Return home CMS Medicare.gov Compare Post Acute Care list provided to:: Patient Choice offered to / list presented to : Patient      Expected Discharge Plan and Services     Post Acute Care Choice: Home Health Living arrangements for the past 2 months: Apartment                           HH Arranged: PT, OT          Prior Living Arrangements/Services Living arrangements for the past 2 months: Apartment Lives with:: Self Patient language and need for interpreter reviewed:: Yes Do you feel safe going back to the place where you live?: Yes      Need for Family Participation in Patient Care: Yes (Comment) Care giver support system in place?: Yes (comment) Current home services: DME (rollator, scooter) Criminal Activity/Legal Involvement Pertinent to Current Situation/Hospitalization: No - Comment as needed  Activities of Daily Living   ADL Screening (condition at time of admission) Independently performs ADLs?: Yes (appropriate for developmental age) Is the patient deaf or have difficulty hearing?: No Does the patient have difficulty seeing,  even when wearing glasses/contacts?: No Does the patient have difficulty concentrating, remembering, or making decisions?: No  Permission Sought/Granted Permission sought to share information with : Photographer granted to share info w AGENCY: HH        Emotional Assessment Appearance:: Appears stated age Attitude/Demeanor/Rapport: Gracious Affect (typically observed): Accepting Orientation: : Oriented to Self, Oriented to Place, Oriented to  Time, Oriented to Situation Alcohol / Substance Use: Not Applicable Psych Involvement: No (comment)  Admission diagnosis:  COPD exacerbation (HCC) [J44.1] COPD with acute exacerbation (HCC) [J44.1] Patient Active Problem List   Diagnosis Date Noted   Respiratory failure with hypoxia (HCC) 07/07/2023   COPD with acute exacerbation (HCC) 04/28/2023   Protein-calorie malnutrition, severe 04/11/2023   COPD exacerbation (HCC) 04/09/2023   Chronic right-sided congestive heart failure (HCC) 01/16/2023   Homeless 01/16/2023   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   Gastroesophageal reflux disease 01/29/2022   Hyperlipidemia 10/04/2020   Macrocytosis 09/03/2020   Atherosclerosis of aorta (HCC) 04/06/2020   Weight loss 08/11/2019   Chronic diastolic heart failure (HCC) 04/25/2019   Chronic respiratory failure with hypoxia and hypercapnia (HCC) 04/25/2019   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, [address] [place] [address] Phone: [phone] Fax: [phone]  [facility] - [facility] [address] [place] [address] Phone: [phone] Fax: [phone]  [facility] [address] [place] [address] Phone: [phone] Fax: [phone]  [id number] [place], [place] - [address] AT [facility] OF [address] [place] [address] Phone: [phone] Fax: [phone]     Social Drivers of Health (SDOH) Social History: SDOH Screenings   Food Insecurity: No Food Insecurity (01/13/2024)  Housing: Low Risk  (01/13/2024)  Transportation Needs: Unmet Transportation Needs (01/13/2024)  Utilities: Not At Risk (01/13/2024)  Alcohol Screen: Low Risk  (12/26/2022)  Depression (PHQ2-9): Low Risk  (01/16/2023)  Physical Activity: Inactive (07/17/2023)  Social Connections: Moderately Isolated (01/13/2024)  Stress: No Stress Concern Present (12/26/2022)  Tobacco Use: Medium Risk (11/15/2023)   SDOH Interventions:     Readmission Risk Interventions    07/12/2023    3:16 PM  Readmission Risk Prevention Plan  Transportation Screening Complete  Medication Review (RN Care Manager) Referral to Pharmacy  PCP or Specialist appointment within 3-5 days of discharge Complete  SW Recovery Care/Counseling Consult Complete  Palliative Care Screening Not Applicable  Skilled Nursing Facility Not Applicable

## 2024-01-14 NOTE — Evaluation (Signed)
 Occupational Therapy Evaluation Patient Details Name: Anthony Nelson MRN: [id number] DOB: 05-01-1961 Today's Date: 01/14/2024   History of Present Illness   Anthony Nelson is a 63 y.o. male with medical history significant for COPD on chronic 3 L O2 [place] at home pulmonary hypertension, diastolic heart failure and severe protein calorie malnutrition who presents with severe shortness of breath earlier today.  He says his caregiver is the one who called EMS.  The patient says he has been more short of breath probably for about a week     Clinical Impressions PTA, pt lived alone with intermittent assist from aide who he reports predominantly helped with IADL and medication management/pillbox set-up. Upon eval, pt with significantly decreased activity tolerance/cardiopulmonary status needing 4L supplementary O2 with 3/4 DOE to perform LB ADL and pericare today. Pt with fair ability to self pace but may benefit from education regarding energy conservation. Will continue to follow acutely, and recommending HHOT at discharge.      If plan is discharge home, recommend the following:   A little help with walking and/or transfers;A little help with bathing/dressing/bathroom;Assistance with cooking/housework;Assist for transportation;Help with stairs or ramp for entrance     Functional Status Assessment   Patient has had a recent decline in their functional status and demonstrates the ability to make significant improvements in function in a reasonable and predictable amount of time.     Equipment Recommendations   BSC/3in1     Recommendations for Other Services         Precautions/Restrictions   Precautions Precautions: Fall;Other (comment) Recall of Precautions/Restrictions: Intact Precaution/Restrictions Comments: Significantly decr functional capacity Restrictions Weight Bearing Restrictions Per Provider Order: No     Mobility Bed Mobility Overal bed mobility: Needs  Assistance Bed Mobility: Supine to Sit     Supine to sit: Supervision     General bed mobility comments: Supervision fro safety; Needed at least 5 minutes of seated rest after the exertion of going supine to sit before he could continue moving    Transfers Overall transfer level: Needs assistance Equipment used: Rollator (4 wheels) Transfers: Sit to/from Stand, Bed to chair/wheelchair/BSC Sit to Stand: Supervision     Step pivot transfers: Supervision     General transfer comment: Had movement pattern, ROM and muscle power to stand and take steps; but fatigued extremely rapidly and needed a considerable amount of seted rest before he could participate in conversation      Balance Overall balance assessment: Needs assistance             Standing balance comment: Cannot stand long due to decr functional capacity                           ADL either performed or assessed with clinical judgement   ADL Overall ADL's : Needs assistance/impaired Eating/Feeding: Independent   Grooming: Set up;Sitting   Upper Body Bathing: Set up;Sitting   Lower Body Bathing: Sit to/from stand;Contact guard assist   Upper Body Dressing : Set up;Sitting   Lower Body Dressing: Contact guard assist;Sit to/from stand   Toilet Transfer: Contact guard assist;Stand-pivot;BSC/3in1   Toileting- Clothing Manipulation and Hygiene: Contact guard assist;Sit to/from stand       Functional mobility during ADLs: Contact guard assist       Vision Baseline Vision/History: 4 Cataracts Ability to See in Adequate Light: 2 Moderately impaired Patient Visual Report: No change from baseline Vision Assessment?:  (inpaired at baseline  with no change)     Perception         Praxis         Pertinent Vitals/Pain Pain Assessment Pain Assessment: No/denies pain     Extremity/Trunk Assessment Upper Extremity Assessment Upper Extremity Assessment: Generalized weakness   Lower  Extremity Assessment Lower Extremity Assessment: Defer to PT evaluation   Cervical / Trunk Assessment Cervical / Trunk Assessment: Other exceptions (Cachectic appearance)   Communication Communication Communication: No apparent difficulties;Other (comment) (Occasionally out of breath and needing to wait to answer questions) Factors Affecting Communication: Other (comment) (SOB)   Cognition Arousal: Alert Behavior During Therapy: WFL for tasks assessed/performed Cognition: No family/caregiver present to determine baseline             OT - Cognition Comments: pt oriented and following commands. aware of accident in pants and asking to clean himself up on arrival. Fair ability to self pace, predominantly limited by functional capacity > ability to self pace                 Following commands: Intact       Cueing  General Comments   Cueing Techniques: Verbal cues;Visual cues  on 4 L supplemental O2 with SpO2 as low as 89%   Exercises     Shoulder Instructions      Home Living Family/patient expects to be discharged to:: Private residence Living Arrangements: Alone Available Help at Discharge: Family;Available PRN/intermittently Type of Home: Apartment Home Access: Level entry     Home Layout: One level     Bathroom Shower/Tub: Producer, television/film/video: Standard     Home Equipment: Grab bars - tub/shower;Rollator (4 wheels);Electric scooter (got electric scooter just a few days before this admission)   Additional Comments: home concentrator of O2      Prior Functioning/Environment Prior Level of Function : Independent/Modified Independent             Mobility Comments: uses rollator for ambulation, reports no falls ADLs Comments: reports independence, but im getting help with it now pt states an aide is being set up for him.3-4x per week. pt reports aide asissts with pillbox set-up and he is able to manage taking medications otherwise.     OT Problem List: Decreased strength;Decreased activity tolerance;Impaired balance (sitting and/or standing);Decreased safety awareness;Decreased knowledge of use of DME or AE;Cardiopulmonary status limiting activity   OT Treatment/Interventions: Self-care/ADL training;Therapeutic exercise;DME and/or AE instruction;Therapeutic activities;Energy conservation;Patient/family education;Balance training      OT Goals(Current goals can be found in the care plan section)   Acute Rehab OT Goals Patient Stated Goal: get better OT Goal Formulation: With patient Time For Goal Achievement: 01/28/24 Potential to Achieve Goals: Good   OT Frequency:  Min 2X/week    Co-evaluation              AM-PAC OT 6 Clicks Daily Activity     Outcome Measure Help from another person eating meals?: A Little Help from another person taking care of personal grooming?: A Little Help from another person toileting, which includes using toliet, bedpan, or urinal?: A Little Help from another person bathing (including washing, rinsing, drying)?: A Little Help from another person to put on and taking off regular upper body clothing?: A Little Help from another person to put on and taking off regular lower body clothing?: A Little 6 Click Score: 18   End of Session Nurse Communication: Mobility status  Activity Tolerance: Patient tolerated treatment well;Other (comment) (limited  by cardiopulmonary status) Patient left: in chair;with call bell/phone within reach  OT Visit Diagnosis: Unsteadiness on feet (R26.81);Muscle weakness (generalized) (M62.81);Low vision, both eyes (H54.2)                Time: 8754-8692 OT Time Calculation (min): 22 min Charges:  OT General Charges $OT Visit: 1 Visit OT Evaluation $OT Eval Moderate Complexity: 1 Mod  Elma JONETTA Lebron FREDERICK, OTR/L [facility] Acute Rehabilitation Office: [phone]   Elma JONETTA Lebron 01/14/2024, 4:33 PM

## 2024-01-14 NOTE — Evaluation (Signed)
 Physical Therapy Evaluation Patient Details Name: Anthony Nelson MRN: [id number] DOB: February 03, 1961 Today's Date: 01/14/2024  History of Present Illness  Anthony Nelson is a 63 y.o. male with medical history significant for COPD on chronic 3 L O2 [place] at home pulmonary hypertension, diastolic heart failure and severe protein calorie malnutrition who presents with severe shortness of breath earlier today.  He says his caregiver is the one who called EMS.  The patient says he has been more short of breath probably for about a week  Clinical Impression   Pt admitted with above diagnosis. Lives at home alone, in a single-level home with a level entry; Prior to admission, pt was able to manage at home on 3.5 to 4L supplemental O2, using a rollator to walk and sitting down when needed; Presents to PT with significantly decr functional capacity, generalized weakness; He has the power to move, stand, and transfer, but fatigues extremely rapidly, and needs incr time for rest and recovery;  he recently acquired an electric scooter, and I think this will help a lot with energy conservation; Pt currently with functional limitations due to the deficits listed below (see PT Problem List). Pt will benefit from skilled PT to increase their independence and safety with mobility to allow discharge to the venue listed below.           If plan is discharge home, recommend the following: A little help with walking and/or transfers;A little help with bathing/dressing/bathroom;Help with stairs or ramp for entrance;Supervision due to cognitive status   Can travel by private vehicle        Equipment Recommendations None recommended by PT (Seems pretty well-equipped)  Recommendations for Other Services  OT consult (as ordered)    Functional Status Assessment Patient has had a recent decline in their functional status and demonstrates the ability to make significant improvements in function in a reasonable and predictable  amount of time.     Precautions / Restrictions Precautions Precautions: Fall;Other (comment) Recall of Precautions/Restrictions: Intact Precaution/Restrictions Comments: Significantly decr functional capacity Restrictions Weight Bearing Restrictions Per Provider Order: No      Mobility  Bed Mobility Overal bed mobility: Needs Assistance Bed Mobility: Supine to Sit     Supine to sit: Supervision     General bed mobility comments: Supervision fro safety; Needed at least 5 minutes of seated rest after the exertion of going supine to sit before he could continue moving    Transfers Overall transfer level: Needs assistance Equipment used: Rollator (4 wheels) Transfers: Sit to/from Stand, Bed to chair/wheelchair/BSC Sit to Stand: Supervision   Step pivot transfers: Supervision       General transfer comment: Had movement pattern, ROM and muscle power to stand and take steps; but fatigued extremely rapidly and needed a considerable amount of seted rest before he could participate in conversation    Ambulation/Gait               General Gait Details: Too fatigued  Stairs            Wheelchair Mobility     Tilt Bed    Modified Rankin (Stroke Patients Only)       Balance Overall balance assessment: Needs assistance             Standing balance comment: Cannot stand long due to decr functional capacity  Pertinent Vitals/Pain Pain Assessment Pain Assessment: No/denies pain    Home Living Family/patient expects to be discharged to:: Private residence Living Arrangements: Alone Available Help at Discharge: Family;Available PRN/intermittently Type of Home: Apartment Home Access: Level entry       Home Layout: One level Home Equipment: Grab bars - tub/shower;Rollator (4 wheels);Electric scooter (got electric scooter just a few days before this admission) Additional Comments: home concentrator of O2     Prior Function Prior Level of Function : Independent/Modified Independent             Mobility Comments: uses rollator for ambulation, reports no falls ADLs Comments: reports independence, but im getting help with it now pt states an aide is being set up for him.     Extremity/Trunk Assessment   Upper Extremity Assessment Upper Extremity Assessment: Defer to OT evaluation    Lower Extremity Assessment Lower Extremity Assessment: Generalized weakness    Cervical / Trunk Assessment Cervical / Trunk Assessment: Other exceptions (Cachectic appearance)  Communication   Communication Communication: No apparent difficulties;Other (comment) (Occasionally out of breath and needing to wait to answer questions) Factors Affecting Communication: Other (comment) (SOB)    Cognition Arousal: Alert Behavior During Therapy: WFL for tasks assessed/performed   PT - Cognitive impairments: No apparent impairments                         Following commands: Intact       Cueing Cueing Techniques: Verbal cues, Visual cues     General Comments General comments (skin integrity, edema, etc.): Session conducted on supplemental O2 4L; HR ranged 92-122 bpm, and lowest O2 sats observed was 89%    Exercises     Assessment/Plan    PT Assessment Patient needs continued PT services  PT Problem List Decreased strength;Decreased activity tolerance;Decreased balance;Decreased mobility;Cardiopulmonary status limiting activity       PT Treatment Interventions DME instruction;Gait training;Functional mobility training;Therapeutic activities;Therapeutic exercise;Balance training;Patient/family education;Wheelchair mobility training;Manual techniques    PT Goals (Current goals can be found in the Care Plan section)  Acute Rehab PT Goals Patient Stated Goal: Recover back to normal PT Goal Formulation: With patient Time For Goal Achievement: 01/28/24 Potential to Achieve Goals: Good     Frequency Min 2X/week     Co-evaluation               AM-PAC PT 6 Clicks Mobility  Outcome Measure Help needed turning from your back to your side while in a flat bed without using bedrails?: None Help needed moving from lying on your back to sitting on the side of a flat bed without using bedrails?: None Help needed moving to and from a bed to a chair (including a wheelchair)?: A Little Help needed standing up from a chair using your arms (e.g., wheelchair or bedside chair)?: A Little Help needed to walk in hospital room?: A Lot Help needed climbing 3-5 steps with a railing? : Total 6 Click Score: 17    End of Session Equipment Utilized During Treatment: Oxygen  Activity Tolerance: Patient limited by fatigue Patient left: in chair;with call bell/phone within reach Nurse Communication: Mobility status (and pt's LUE IV needs attention) PT Visit Diagnosis: Unsteadiness on feet (R26.81);Other abnormalities of gait and mobility (R26.89);Other (comment) (decr functional capacity)    Time: 8949-8864 PT Time Calculation (min) (ACUTE ONLY): 45 min   Charges:   PT Evaluation $PT Eval Moderate Complexity: 1 Mod PT Treatments $Therapeutic Activity: 23-37 mins PT General  Charges $$ ACUTE PT VISIT: 1 Visit         Silvano Currier, PT  Acute Rehabilitation Services Office [phone] Secure Chat welcomed   Silvano VEAR Currier 01/14/2024, 2:58 PM

## 2024-01-14 NOTE — Progress Notes (Signed)
 Initial Nutrition Assessment  DOCUMENTATION CODES:   Severe malnutrition in context of chronic illness, Underweight  INTERVENTION:  -Continue regular diet as ordered -Add Nepro BID to provide concentrated kcal/pro -Add Magic Cup TID -Add MVI w/min   NUTRITION DIAGNOSIS:   Severe Malnutrition related to chronic illness, early satiety as evidenced by severe fat depletion, severe muscle depletion, percent weight loss.   GOAL:   Patient will meet greater than or equal to 90% of their needs, Weight gain   MONITOR:   PO intake, Supplement acceptance, Labs, Weight trends, Skin  REASON FOR ASSESSMENT:   Consult Assessment of nutrition requirement/status  ASSESSMENT:   Hx COPD on chronic 3 L O2 [name] at home pulmonary hypertension, diastolic heart failure and severe protein calorie malnutrition. Admit for SOB, COPD exac  Spoke to pt in room. Pt denies n/v/c/d or chewing/swallowing difficulties. Last BM 7/15. Pt endorses good appetite, no documented PO intake, however, lunch tray in room with pt eating ~85% of the meal. Pt states similar intake for this mornings meal. Discussed need for increased kcal/pro intake in the context of his low body weight/SPCM. Pt knows this per discussions with other providers. Discussed fortifying foods with high kcal additions. Discussed adding Nepro BID for high kcal concentration. Pt agreeable to also adding Magic Cup TID. Pt was previously homeless, now does reside in a home with home health help. Pt states he both makes his own meals, has other people who make his meals. He does endorse drinking Ensure at home. NFPE completed (see below), pt meets criteria for [facility]. Pt with documented weight loss from 59 kg-47.1 kg (11.9 kg/20%) x 9 months, which is severe. Pt is also on Lasix  for BLE edema. Fluid accumulation/diuresis also may be affecting weight. Pt denies additional questions/concerns at this time, will continue to monitor, RDN available prn.    Labs Chloride 90 CO2 38 Creat 0.59 Calcium  ionized 1.06 H/H 9.8/31.3  Medications Rocephin  Heparin  MVI w/min Prednisone    NUTRITION - FOCUSED PHYSICAL EXAM:  Flowsheet Row Most Recent Value  Orbital Region Severe depletion  Upper Arm Region Severe depletion  Thoracic and Lumbar Region Severe depletion  Buccal Region Severe depletion  Temple Region Severe depletion  Clavicle Bone Region Severe depletion  Clavicle and Acromion Bone Region Severe depletion  Scapular Bone Region Severe depletion  Dorsal Hand Severe depletion  Patellar Region Severe depletion  Anterior Thigh Region Severe depletion  Posterior Calf Region Severe depletion  Edema (RD Assessment) Mild  Hair Reviewed  Eyes Reviewed  Mouth Reviewed  Skin Reviewed  Nails Reviewed    Diet Order:   Diet Order             Diet regular Room service appropriate? Yes; Fluid consistency: Thin  Diet effective now                   EDUCATION NEEDS:   Education needs have been addressed  Skin:  Skin Assessment: Reviewed RN Assessment  Last BM:  7/15  Height:   Ht Readings from Last 1 Encounters:  01/14/24 6' (1.829 m)    Weight:   Wt Readings from Last 1 Encounters:  01/14/24 47.1 kg    Ideal Body Weight:  80.8 kg  BMI:  Body mass index is 14.08 kg/m.  Estimated Nutritional Needs:   Kcal:  1650-1900 kcal  Protein:  70-95 g  Fluid:  >/=2L    Anthony Nelson, RDN, LDN Registered Dietitian Nutritionist RD Inpatient Contact Info in [place]

## 2024-01-14 NOTE — Plan of Care (Signed)

## 2024-01-14 NOTE — Progress Notes (Signed)
 Progress Note   Patient: Anthony Nelson FMW:[id number] DOB: January 10, 1961 DOA: 01/13/2024     1 DOS: the patient was seen and examined on 01/14/2024   Brief hospital course: Anthony Nelson is a 63 y.o. male with medical history significant for COPD on chronic 3 L O2 [place] at home pulmonary hypertension, diastolic heart failure and severe protein calorie malnutrition who presents with severe shortness of breath.  Patient had prior admissions for COPD, was intubated during last hospitalization 5 months ago.  Patient is admitted to the hospitalist service for further management evaluation of acute COPD exacerbation.  Assessment and Plan: COPD exacerbation- Continue bronchodilators. Continue oral steroids. Stop Rocephin , will give doxycycline  for 3 days. PT/ OT evaluation for discharge plan. Continue supplemental o2 he uses at home 3L.  Severe protein calorie malnutrition- Dietician consultation. Encourage oral diet, supplements.  Diastolic heart failure- He seems euvolemic. No exacerbation. Continue home dose Lasix . Compliance with medications advised.      Out of bed to chair. Incentive spirometry. Nursing supportive care. Fall, aspiration precautions. Diet:  Diet Orders (From admission, onward)     Start     Ordered   01/13/24 1931  Diet regular Room service appropriate? Yes; Fluid consistency: Thin  Diet effective now       Comments: With hs snack  Question Answer Comment  Room service appropriate? Yes   Fluid consistency: Thin      01/13/24 1931           DVT prophylaxis: heparin  injection 5,000 Units Start: 01/13/24 2200  Level of care: Med-Surg   Code Status: Full Code  Subjective: Patient is seen and examined today morning.  He feels better, currently on 3 L supplemental oxygen  which he is at home.  Did not get out of bed.  Eating fair.  Physical Exam: Vitals:   01/14/24 0737 01/14/24 0759 01/14/24 1153 01/14/24 1542  BP:  105/74    Pulse:  77    Resp:  18     Temp:  98 F (36.7 C)    TempSrc:  Oral    SpO2: 99% 100% 97% 98%  Weight:      Height:        General - Elderly cachectic African-American male, no apparent distress HEENT - PERRLA, EOMI, atraumatic head, non tender sinuses. Lung - Clear, no rales, rhonchi, diffuse wheezes. Heart - S1, S2 heard, no murmurs, rubs, trace pedal edema. Abdomen - Soft, non tender, bowel sounds good Neuro - Alert, awake and oriented x 3, non focal exam. Skin - Warm and dry.  Data Reviewed:      Latest Ref Rng & Units 01/14/2024    5:58 AM 01/13/2024    5:23 PM 01/13/2024    1:17 PM  CBC  WBC 4.0 - 10.5 K/uL 6.3   5.7   Hemoglobin 13.0 - 17.0 g/dL 9.8  87.7  88.8   Hematocrit 39.0 - 52.0 % 31.1  36.0  36.4   Platelets 150 - 400 K/uL 144   131       Latest Ref Rng & Units 01/14/2024    5:58 AM 01/13/2024    5:23 PM 01/13/2024    1:17 PM  BMP  Glucose 70 - 99 mg/dL 91   843   BUN 8 - 23 mg/dL 17   17   Creatinine 9.38 - 1.24 mg/dL 9.40   9.39   Sodium 864 - 145 mmol/L 138  140  139   Potassium 3.5 - 5.1  mmol/L 3.9  4.4  4.0   Chloride 98 - 111 mmol/L 90   89   CO2 22 - 32 mmol/L 38   40   Calcium  8.9 - 10.3 mg/dL 9.0   9.1    US  Abdomen Complete Result Date: 01/14/2024 CLINICAL DATA:  29481 Abdominal pulsatile mass 29481 EXAM: ABDOMEN ULTRASOUND COMPLETE COMPARISON:  July 07, 2023 FINDINGS: Gallbladder: No gallstones or wall thickening visualized. No sonographic Murphy sign noted by sonographer. Common bile duct: Diameter: Visualized portion measures 4 mm, within normal limits. Liver: No focal lesion identified. Within normal limits in parenchymal echogenicity. Portal vein is patent on color Doppler imaging with normal direction of blood flow towards the liver. IVC: No abnormality visualized. Pancreas: Visualized portion unremarkable. Spleen: Size and appearance within normal limits. Portions are suboptimally assessed secondary to shadowing bowel gas. Right Kidney: Length: 12.3 cm. Echogenicity  within normal limits. No mass or hydronephrosis visualized. Left Kidney: Length: 11.8 cm. Echogenicity within normal limits. No mass or hydronephrosis visualized. Abdominal aorta: No aneurysm visualized. Portions are suboptimally assessed secondary to shadowing bowel gas. Atherosclerosis. Other findings: None. IMPRESSION: No sonographic etiology for pulsatile visualized. Electronically Signed   By: Corean Salter M.D.   On: 01/14/2024 07:51   DG Chest 2 View Result Date: 01/13/2024 CLINICAL DATA:  Shortness of breath EXAM: CHEST - 2 VIEW COMPARISON:  Chest x-ray 11/15/2023 FINDINGS: The lungs are hyperinflated compatible with emphysema, unchanged. There is no focal lung infiltrate, pleural effusion or pneumothorax. The cardiomediastinal silhouette is within normal limits. No acute fractures are seen. IMPRESSION: 1. No active cardiopulmonary disease. 2. Emphysema. Electronically Signed   By: Greig Pique M.D.   On: 01/13/2024 15:11    Family Communication: Discussed with patient, understand and agree. All questions answered.  Disposition: Status is: Inpatient Remains inpatient appropriate because: Supplemental oxygen , steroids, antibiotics, PT OT evaluation  Planned Discharge Destination: Home     Time spent: 40 minutes  Author: Concepcion Riser, MD 01/14/2024 4:27 PM Secure chat 7am to 7pm For on call review [email or web address].

## 2024-01-15 ENCOUNTER — Other Ambulatory Visit (HOSPITAL_COMMUNITY): Payer: Self-pay

## 2024-01-15 ENCOUNTER — Encounter (HOSPITAL_COMMUNITY): Payer: Self-pay | Admitting: Internal Medicine

## 2024-01-15 DIAGNOSIS — E43 Unspecified severe protein-calorie malnutrition: Secondary | ICD-10-CM | POA: Diagnosis not present

## 2024-01-15 DIAGNOSIS — J441 Chronic obstructive pulmonary disease with (acute) exacerbation: Secondary | ICD-10-CM | POA: Diagnosis not present

## 2024-01-15 DIAGNOSIS — J9611 Chronic respiratory failure with hypoxia: Secondary | ICD-10-CM | POA: Diagnosis not present

## 2024-01-15 DIAGNOSIS — I2723 Pulmonary hypertension due to lung diseases and hypoxia: Secondary | ICD-10-CM | POA: Diagnosis not present

## 2024-01-15 LAB — BASIC METABOLIC PANEL WITH GFR
Anion gap: 13 (ref 5–15)
BUN: 18 mg/dL (ref 8–23)
CO2: 37 mmol/L — ABNORMAL HIGH (ref 22–32)
Calcium: 9.1 mg/dL (ref 8.9–10.3)
Chloride: 90 mmol/L — ABNORMAL LOW (ref 98–111)
Creatinine, Ser: 0.43 mg/dL — ABNORMAL LOW (ref 0.61–1.24)
GFR, Estimated: >60 mL/min (ref >60)
Glucose, Bld: 80 mg/dL (ref 70–99)
Potassium: 3.7 mmol/L (ref 3.5–5.1)
Sodium: 140 mmol/L (ref 135–145)

## 2024-01-15 LAB — CBC
HCT: 32.7 % — ABNORMAL LOW (ref 39.0–52.0)
Hemoglobin: 10.3 g/dL — ABNORMAL LOW (ref 13.0–17.0)
MCH: 32 pg (ref 26.0–34.0)
MCHC: 31.5 g/dL (ref 30.0–36.0)
MCV: 101.6 fL — ABNORMAL HIGH (ref 80.0–100.0)
Platelets: 135 K/uL — ABNORMAL LOW (ref 150–400)
RBC: 3.22 MIL/uL — ABNORMAL LOW (ref 4.22–5.81)
RDW: 12.5 % (ref 11.5–15.5)
WBC: 5.8 K/uL (ref 4.0–10.5)
nRBC: 0 % (ref 0.0–0.2)

## 2024-01-15 MED ORDER — DOXYCYCLINE HYCLATE 100 MG PO TABS
100.0000 mg | ORAL_TABLET | Freq: Two times a day (BID) | ORAL | 0 refills | Status: AC
  Filled 2024-01-15: qty 4, 2d supply, fill #0

## 2024-01-15 MED ORDER — PREDNISONE 20 MG PO TABS
ORAL_TABLET | ORAL | 0 refills | Status: AC
  Filled 2024-01-15: qty 7, 5d supply, fill #0

## 2024-01-15 MED ORDER — IPRATROPIUM-ALBUTEROL 0.5-2.5 (3) MG/3ML IN SOLN
3.0000 mL | Freq: Three times a day (TID) | RESPIRATORY_TRACT | Status: DC
  Administered 2024-01-15: 3 mL via RESPIRATORY_TRACT
  Filled 2024-01-15: qty 3

## 2024-01-15 NOTE — Progress Notes (Signed)
 Mobility Specialist Progress Note:    01/15/24 0915  Mobility  Activity Ambulated with assistance in hallway  Level of Assistance Standby assist, set-up cues, supervision of patient - no hands on  Assistive Device Four wheel walker  Distance Ambulated (ft) 300 ft  Activity Response Tolerated well  Mobility Referral Yes  Mobility visit 1 Mobility  Mobility Specialist Start Time (ACUTE ONLY) 0900  Mobility Specialist Stop Time (ACUTE ONLY) 0915  Mobility Specialist Time Calculation (min) (ACUTE ONLY) 15 min   Pt received in chair agreeable to mobility. Pt found on 3L/min and ambulated on 4L/min, VSS, no c/o SOB. Returned to room w/o fault. Left in chair w/ call bell and personal belongings in reach. Left on 3L/min.  Thersia Minder Mobility Specialist  Please contact vis Secure Chat or  Rehab Office [phone]

## 2024-01-15 NOTE — TOC Transition Note (Signed)
 Transition of Care [facility]) - Discharge Note   Patient Details  Name: Anthony Nelson MRN: [id number] Date of Birth: 1961/05/01  Transition of Care [facility]) CM/SW Contact:  Roxie KANDICE Stain, RN Phone Number: 01/15/2024, 11:42 AM   Clinical Narrative:    Patient stable for discharge. BSC ordered from [place] to be shipped to address on file. Cheryl with admeysis notified of discharge.  Family to bring home 02 for discharge.     Final next level of care: Home w Home Health Services Barriers to Discharge: Barriers Resolved   Patient Goals and CMS Choice Patient states their goals for this hospitalization and ongoing recovery are:: Return home CMS Medicare.gov Compare Post Acute Care list provided to:: Patient Choice offered to / list presented to : Patient      Discharge Placement             home          Discharge Plan and Services Additional resources added to the After Visit Summary for       Post Acute Care Choice: Home Health          DME Arranged: Bedside commode DME Agency: Other - Comment ([facility]) Date DME Agency Contacted: 01/15/24 Time DME Agency Contacted: 1141 Representative spoke with at DME Agency: [facility] Arranged: PT, OT, RN HH Agency: [facility] Date [facility] Agency Contacted: 01/15/24 Time HH Agency Contacted: 1142 Representative spoke with at [facility] Agency: [place]  Social Drivers of Health (SDOH) Interventions SDOH Screenings   Food Insecurity: No Food Insecurity (01/13/2024)  Housing: Low Risk  (01/13/2024)  Transportation Needs: Unmet Transportation Needs (01/13/2024)  Utilities: Not At Risk (01/13/2024)  Alcohol Screen: Low Risk  (12/26/2022)  Depression (PHQ2-9): Low Risk  (01/16/2023)  Physical Activity: Inactive (07/17/2023)  Social Connections: Moderately Isolated (01/13/2024)  Stress: No Stress Concern Present (12/26/2022)  Tobacco Use: Medium Risk (01/15/2024)     Readmission Risk Interventions    07/12/2023    3:16 PM   Readmission Risk Prevention Plan  Transportation Screening Complete  Medication Review (RN Care Manager) Referral to Pharmacy  PCP or Specialist appointment within 3-5 days of discharge Complete  SW Recovery Care/Counseling Consult Complete  Palliative Care Screening Not Applicable  Skilled Nursing Facility Not Applicable

## 2024-01-15 NOTE — Discharge Summary (Signed)
 Physician Discharge Summary   Patient: Anthony Nelson MRN: [id number] DOB: August 04, 1960  Admit date:     01/13/2024  Discharge date: {dischdate:26783}  Discharge Physician: Concepcion Riser   PCP: [facility]   Recommendations at discharge:  {Tip this will not be part of the note when signed- Example include specific recommendations for outpatient follow-up, pending tests to follow-up on. 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 [facility] Course: No notes on file  Assessment and Plan: No notes have been filed under this hospital service. Service: Hospitalist     {Tip this will not be part of the note when signed Body mass index is 14.08 kg/m. ,  Nutrition Documentation    Flowsheet Row ED to Hosp-Admission (Current) from 01/13/2024 in [facility]SURG UNIT  Nutrition Problem Severe Malnutrition  Etiology chronic illness, early satiety  Nutrition Goal Patient will meet greater than or equal to 90% of their needs, Weight gain  Interventions Refer to RD note for recommendations  ,  (Optional):26781}  {(NOTE) Pain control PDMP Statment (Optional):26782} Consultants: *** Procedures performed: ***  Disposition: {Plan; Disposition:26390} Diet recommendation:  Discharge Diet Orders (From admission, onward)     Start     Ordered   01/15/24 0000  Diet - low sodium heart healthy        01/15/24 1046           {Diet_Plan:26776} DISCHARGE MEDICATION: Allergies as of 01/15/2024       Reactions   Erythromycin Other (See Comments)   Allergy from  childhood- does not take it        Medication List     TAKE these medications    aspirin  EC 81 MG tablet Take 81 mg by mouth daily. Swallow whole.   doxycycline  100 MG tablet Commonly known as: VIBRA -TABS Take 1 tablet (100 mg total) by mouth every 12 (twelve) hours for 2 days.   ENSURE PO Take 1 Bottle by mouth 2 (two) times daily as needed (poor appetite).   furosemide  20 MG tablet Commonly known as: LASIX  Take 20 mg by mouth daily.   GINSENG PO Take 1 capsule by mouth daily.   multivitamin with minerals tablet Take 1 tablet by mouth daily.   OXYGEN  Inhale into the lungs continuous.   predniSONE  20 MG tablet Commonly known as: DELTASONE  Take 2 tablets (40 mg total) by mouth daily with breakfast for 2 days, THEN 1 tablet (20 mg total) daily with breakfast for 3 days. Start taking on: January 16, 2024   rosuvastatin  40 MG tablet Commonly known as: CRESTOR  Take 40 mg by mouth daily.   Trelegy Ellipta  200-62.5-25 MCG/ACT Aepb Generic drug: Fluticasone -Umeclidin-Vilant Inhale 1 puff into the lungs in the morning and at bedtime.   Ventolin  HFA 108 (90 Base) MCG/ACT inhaler Generic drug: albuterol  Inhale 2 puffs into the lungs every 6 hours as needed for wheezing or shortness of breath.   VITAMIN E PO Take 1 capsule by mouth daily.               Durable Medical Equipment  (From admission, onward)  Start     Ordered   01/15/24 1046  DME 3-in-1  Once        01/15/24 1046            Follow-up Information     [facility] Follow [facility]   Why: Home health has been arranged. they will contact you to schedule apt within 48hrs post discharge. Contact information: [address] [place] [address] [phone]         [facility] in 1 week(s).   Why: Hospital follow up Contact information: [address] [facility] [place] [address] [phone]                Discharge Exam: Fredricka Weights    01/14/24 0648  Weight: 47.1 kg   ***  Condition at discharge: {DC Condition:26389}  The results of significant diagnostics from this hospitalization (including imaging, microbiology, ancillary and laboratory) are listed below for reference.   Imaging Studies: US  Abdomen Complete Result Date: 01/14/2024 CLINICAL DATA:  29481 Abdominal pulsatile mass 29481 EXAM: ABDOMEN ULTRASOUND COMPLETE COMPARISON:  July 07, 2023 FINDINGS: Gallbladder: No gallstones or wall thickening visualized. No sonographic Murphy sign noted by sonographer. Common bile duct: Diameter: Visualized portion measures 4 mm, within normal limits. Liver: No focal lesion identified. Within normal limits in parenchymal echogenicity. Portal vein is patent on color Doppler imaging with normal direction of blood flow towards the liver. IVC: No abnormality visualized. Pancreas: Visualized portion unremarkable. Spleen: Size and appearance within normal limits. Portions are suboptimally assessed secondary to shadowing bowel gas. Right Kidney: Length: 12.3 cm. Echogenicity within normal limits. No mass or hydronephrosis visualized. Left Kidney: Length: 11.8 cm. Echogenicity within normal limits. No mass or hydronephrosis visualized. Abdominal aorta: No aneurysm visualized. Portions are suboptimally assessed secondary to shadowing bowel gas. Atherosclerosis. Other findings: None. IMPRESSION: No sonographic etiology for pulsatile visualized. Electronically Signed   By: Corean Salter M.D.   On: 01/14/2024 07:51   DG Chest 2 View Result Date: 01/13/2024 CLINICAL DATA:  Shortness of breath EXAM: CHEST - 2 VIEW COMPARISON:  Chest x-ray 11/15/2023 FINDINGS: The lungs are hyperinflated compatible with emphysema, unchanged. There is no focal lung infiltrate, pleural effusion or pneumothorax. The cardiomediastinal silhouette is within normal limits. No acute fractures are seen. IMPRESSION: 1. No active cardiopulmonary disease. 2. Emphysema.  Electronically Signed   By: Greig Pique M.D.   On: 01/13/2024 15:11    Microbiology: Results for orders placed or performed during the hospital encounter of 01/13/24  Resp panel by RT-PCR (RSV, Flu A&B, Covid) Anterior Nasal Swab     Status: None   Collection Time: 01/13/24  5:44 PM   Specimen: Anterior Nasal Swab  Result Value Ref Range Status   SARS Coronavirus 2 by RT PCR NEGATIVE NEGATIVE Final   Influenza A by PCR NEGATIVE NEGATIVE Final   Influenza B by PCR NEGATIVE NEGATIVE Final    Comment: (NOTE) The Xpert Xpress SARS-CoV-2/FLU/RSV plus assay is intended as an aid in the diagnosis of influenza from Nasopharyngeal swab specimens and should not be used as a sole basis for treatment. Nasal washings and aspirates are unacceptable for Xpert Xpress SARS-CoV-2/FLU/RSV testing.  Fact Sheet for Patients: BloggerCourse.com  Fact Sheet for Healthcare Providers: SeriousBroker.it  This test is not yet approved or cleared by the [place]  FDA and has been authorized for detection and/or diagnosis of SARS-CoV-2 by FDA under an Emergency Use Authorization (EUA). This EUA will remain in effect (meaning this test can be  used) for the duration of the COVID-19 declaration under Section 564(b)(1) of the Act, 21 U.S.C. section 360bbb-3(b)(1), unless the authorization is terminated or revoked.     Resp Syncytial Virus by PCR NEGATIVE NEGATIVE Final    Comment: (NOTE) Fact Sheet for Patients: BloggerCourse.com  Fact Sheet for Healthcare Providers: SeriousBroker.it  This test is not yet approved or cleared by the [place]  FDA and has been authorized for detection and/or diagnosis of SARS-CoV-2 by FDA under an Emergency Use Authorization (EUA). This EUA will remain in effect (meaning this test can be used) for the duration of the COVID-19 declaration under Section 564(b)(1) of the  Act, 21 U.S.C. section 360bbb-3(b)(1), unless the authorization is terminated or revoked.  Performed at [facility], [address] [place], [place] [address]     Labs: CBC: Recent Labs  Lab 01/13/24 1317 01/13/24 1723 01/14/24 0558 01/15/24 0524  WBC 5.7  --  6.3 5.8  NEUTROABS 4.7  --   --   --   HGB 11.1* 12.2* 9.8* 10.3*  HCT 36.4* 36.0* 31.1* 32.7*  MCV 107.4*  --  103.0* 101.6*  PLT 131*  --  144* 135*   Basic Metabolic Panel: Recent Labs  Lab 01/13/24 1317 01/13/24 1723 01/14/24 0558 01/15/24 0524  NA 139 140 138 140  K 4.0 4.4 3.9 3.7  CL 89*  --  90* 90*  CO2 40*  --  38* 37*  GLUCOSE 156*  --  91 80  BUN 17  --  17 18  CREATININE 0.60*  --  0.59* 0.43*  CALCIUM  9.1  --  9.0 9.1   Liver Function Tests: Recent Labs  Lab 01/13/24 1317  AST 19  ALT 10  ALKPHOS 41  BILITOT 0.6  PROT 6.4*  ALBUMIN 3.6   CBG: No results for input(s): GLUCAP in the last 168 hours.  Discharge time spent: {LESS THAN/GREATER UYJW:73611} 30 minutes.  Signed: Concepcion Riser, MD [facility] 01/15/2024

## 2024-01-15 NOTE — Plan of Care (Signed)

## 2024-01-21 ENCOUNTER — Other Ambulatory Visit (HOSPITAL_BASED_OUTPATIENT_CLINIC_OR_DEPARTMENT_OTHER): Payer: Self-pay

## 2024-02-22 ENCOUNTER — Emergency Department (HOSPITAL_COMMUNITY)

## 2024-02-22 ENCOUNTER — Inpatient Hospital Stay (HOSPITAL_COMMUNITY)
Admission: EM | Admit: 2024-02-22 | Discharge: 2024-03-04 | DRG: 871 | Disposition: A | Discharge status: Skilled Nursing Facility | Attending: Internal Medicine | Admitting: Internal Medicine

## 2024-02-22 ENCOUNTER — Other Ambulatory Visit: Payer: Self-pay

## 2024-02-22 ENCOUNTER — Inpatient Hospital Stay (HOSPITAL_COMMUNITY)

## 2024-02-22 ENCOUNTER — Encounter (HOSPITAL_COMMUNITY): Payer: Self-pay

## 2024-02-22 DIAGNOSIS — R54 Age-related physical debility: Secondary | ICD-10-CM | POA: Diagnosis present

## 2024-02-22 DIAGNOSIS — E874 Mixed disorder of acid-base balance: Secondary | ICD-10-CM | POA: Diagnosis present

## 2024-02-22 DIAGNOSIS — I2729 Other secondary pulmonary hypertension: Secondary | ICD-10-CM | POA: Diagnosis present

## 2024-02-22 DIAGNOSIS — D7589 Other specified diseases of blood and blood-forming organs: Secondary | ICD-10-CM | POA: Diagnosis present

## 2024-02-22 DIAGNOSIS — E43 Unspecified severe protein-calorie malnutrition: Secondary | ICD-10-CM | POA: Diagnosis not present

## 2024-02-22 DIAGNOSIS — J440 Chronic obstructive pulmonary disease with acute lower respiratory infection: Secondary | ICD-10-CM | POA: Diagnosis present

## 2024-02-22 DIAGNOSIS — R262 Difficulty in walking, not elsewhere classified: Secondary | ICD-10-CM | POA: Diagnosis not present

## 2024-02-22 DIAGNOSIS — R4182 Altered mental status, unspecified: Principal | ICD-10-CM

## 2024-02-22 DIAGNOSIS — Z515 Encounter for palliative care: Secondary | ICD-10-CM | POA: Diagnosis not present

## 2024-02-22 DIAGNOSIS — Z66 Do not resuscitate: Secondary | ICD-10-CM | POA: Diagnosis present

## 2024-02-22 DIAGNOSIS — J9691 Respiratory failure, unspecified with hypoxia: Secondary | ICD-10-CM | POA: Diagnosis not present

## 2024-02-22 DIAGNOSIS — Z681 Body mass index (BMI) 19 or less, adult: Secondary | ICD-10-CM

## 2024-02-22 DIAGNOSIS — I5032 Chronic diastolic (congestive) heart failure: Secondary | ICD-10-CM | POA: Diagnosis present

## 2024-02-22 DIAGNOSIS — K219 Gastro-esophageal reflux disease without esophagitis: Secondary | ICD-10-CM | POA: Diagnosis present

## 2024-02-22 DIAGNOSIS — D638 Anemia in other chronic diseases classified elsewhere: Secondary | ICD-10-CM | POA: Diagnosis present

## 2024-02-22 DIAGNOSIS — Z7401 Bed confinement status: Secondary | ICD-10-CM | POA: Diagnosis not present

## 2024-02-22 DIAGNOSIS — I468 Cardiac arrest due to other underlying condition: Secondary | ICD-10-CM | POA: Diagnosis not present

## 2024-02-22 DIAGNOSIS — R6521 Severe sepsis with septic shock: Secondary | ICD-10-CM | POA: Diagnosis present

## 2024-02-22 DIAGNOSIS — D649 Anemia, unspecified: Secondary | ICD-10-CM | POA: Diagnosis not present

## 2024-02-22 DIAGNOSIS — J9621 Acute and chronic respiratory failure with hypoxia: Secondary | ICD-10-CM | POA: Diagnosis present

## 2024-02-22 DIAGNOSIS — I50812 Chronic right heart failure: Secondary | ICD-10-CM | POA: Diagnosis not present

## 2024-02-22 DIAGNOSIS — E785 Hyperlipidemia, unspecified: Secondary | ICD-10-CM | POA: Diagnosis present

## 2024-02-22 DIAGNOSIS — Z7189 Other specified counseling: Secondary | ICD-10-CM | POA: Diagnosis not present

## 2024-02-22 DIAGNOSIS — Z1152 Encounter for screening for COVID-19: Secondary | ICD-10-CM

## 2024-02-22 DIAGNOSIS — Z87891 Personal history of nicotine dependence: Secondary | ICD-10-CM

## 2024-02-22 DIAGNOSIS — E878 Other disorders of electrolyte and fluid balance, not elsewhere classified: Secondary | ICD-10-CM | POA: Diagnosis not present

## 2024-02-22 DIAGNOSIS — L8990 Pressure ulcer of unspecified site, unspecified stage: Secondary | ICD-10-CM | POA: Insufficient documentation

## 2024-02-22 DIAGNOSIS — J9622 Acute and chronic respiratory failure with hypercapnia: Secondary | ICD-10-CM | POA: Diagnosis not present

## 2024-02-22 DIAGNOSIS — E876 Hypokalemia: Secondary | ICD-10-CM | POA: Diagnosis present

## 2024-02-22 DIAGNOSIS — A419 Sepsis, unspecified organism: Secondary | ICD-10-CM | POA: Diagnosis not present

## 2024-02-22 DIAGNOSIS — J189 Pneumonia, unspecified organism: Secondary | ICD-10-CM | POA: Diagnosis present

## 2024-02-22 DIAGNOSIS — E010 Iodine-deficiency related diffuse (endemic) goiter: Secondary | ICD-10-CM | POA: Diagnosis not present

## 2024-02-22 DIAGNOSIS — J9620 Acute and chronic respiratory failure, unspecified whether with hypoxia or hypercapnia: Secondary | ICD-10-CM | POA: Diagnosis present

## 2024-02-22 DIAGNOSIS — R64 Cachexia: Secondary | ICD-10-CM | POA: Diagnosis present

## 2024-02-22 DIAGNOSIS — M6281 Muscle weakness (generalized): Secondary | ICD-10-CM | POA: Diagnosis not present

## 2024-02-22 DIAGNOSIS — R5381 Other malaise: Secondary | ICD-10-CM

## 2024-02-22 DIAGNOSIS — G9340 Encephalopathy, unspecified: Secondary | ICD-10-CM | POA: Diagnosis not present

## 2024-02-22 DIAGNOSIS — R41841 Cognitive communication deficit: Secondary | ICD-10-CM | POA: Diagnosis not present

## 2024-02-22 DIAGNOSIS — F29 Unspecified psychosis not due to a substance or known physiological condition: Secondary | ICD-10-CM | POA: Diagnosis not present

## 2024-02-22 DIAGNOSIS — G928 Other toxic encephalopathy: Secondary | ICD-10-CM | POA: Diagnosis present

## 2024-02-22 DIAGNOSIS — Z8051 Family history of malignant neoplasm of kidney: Secondary | ICD-10-CM

## 2024-02-22 DIAGNOSIS — J9602 Acute respiratory failure with hypercapnia: Secondary | ICD-10-CM

## 2024-02-22 DIAGNOSIS — Z79899 Other long term (current) drug therapy: Secondary | ICD-10-CM | POA: Diagnosis not present

## 2024-02-22 DIAGNOSIS — Z9981 Dependence on supplemental oxygen: Secondary | ICD-10-CM

## 2024-02-22 DIAGNOSIS — Z7982 Long term (current) use of aspirin: Secondary | ICD-10-CM

## 2024-02-22 DIAGNOSIS — D6959 Other secondary thrombocytopenia: Secondary | ICD-10-CM | POA: Diagnosis present

## 2024-02-22 DIAGNOSIS — N179 Acute kidney failure, unspecified: Secondary | ICD-10-CM | POA: Diagnosis present

## 2024-02-22 DIAGNOSIS — I469 Cardiac arrest, cause unspecified: Secondary | ICD-10-CM | POA: Diagnosis not present

## 2024-02-22 DIAGNOSIS — J441 Chronic obstructive pulmonary disease with (acute) exacerbation: Secondary | ICD-10-CM | POA: Diagnosis present

## 2024-02-22 DIAGNOSIS — E162 Hypoglycemia, unspecified: Secondary | ICD-10-CM | POA: Diagnosis not present

## 2024-02-22 DIAGNOSIS — Z833 Family history of diabetes mellitus: Secondary | ICD-10-CM

## 2024-02-22 DIAGNOSIS — J9611 Chronic respiratory failure with hypoxia: Secondary | ICD-10-CM | POA: Diagnosis not present

## 2024-02-22 DIAGNOSIS — R1310 Dysphagia, unspecified: Secondary | ICD-10-CM | POA: Diagnosis present

## 2024-02-22 DIAGNOSIS — Z7951 Long term (current) use of inhaled steroids: Secondary | ICD-10-CM

## 2024-02-22 DIAGNOSIS — Z8249 Family history of ischemic heart disease and other diseases of the circulatory system: Secondary | ICD-10-CM

## 2024-02-22 LAB — GLUCOSE, CAPILLARY
Glucose-Capillary: 305 mg/dL — ABNORMAL HIGH (ref 70–99)
Glucose-Capillary: 245 mg/dL — ABNORMAL HIGH (ref 70–99)
Glucose-Capillary: 185 mg/dL — ABNORMAL HIGH (ref 70–99)

## 2024-02-22 LAB — URINALYSIS, W/ REFLEX TO CULTURE (INFECTION SUSPECTED)
Bacteria, UA: NONE SEEN
Bilirubin Urine: NEGATIVE
Glucose, UA: NEGATIVE mg/dL
Ketones, ur: 5 mg/dL — AB
Leukocytes,Ua: NEGATIVE
Nitrite: NEGATIVE
Protein, ur: 30 mg/dL — AB
Specific Gravity, Urine: 1.013 (ref 1.005–1.030)
pH: 5 (ref 5.0–8.0)

## 2024-02-22 LAB — POCT I-STAT 7, (LYTES, BLD GAS, ICA,H+H)
Acid-Base Excess: 16 mmol/L — ABNORMAL HIGH (ref 0.0–2.0)
Acid-Base Excess: 15 mmol/L — ABNORMAL HIGH (ref 0.0–2.0)
Bicarbonate: 45.4 mmol/L — ABNORMAL HIGH (ref 20.0–28.0)
Bicarbonate: 39.7 mmol/L — ABNORMAL HIGH (ref 20.0–28.0)
Calcium, Ion: 1.6 mmol/L (ref 1.15–1.40)
Calcium, Ion: 1.18 mmol/L (ref 1.15–1.40)
HCT: 37 % — ABNORMAL LOW (ref 39.0–52.0)
HCT: 35 % — ABNORMAL LOW (ref 39.0–52.0)
Hemoglobin: 12.6 g/dL — ABNORMAL LOW (ref 13.0–17.0)
Hemoglobin: 11.9 g/dL — ABNORMAL LOW (ref 13.0–17.0)
O2 Saturation: 100 %
O2 Saturation: 100 %
Patient temperature: 37.6
Patient temperature: 39.4
Potassium: 3.3 mmol/L — ABNORMAL LOW (ref 3.5–5.1)
Potassium: 2.8 mmol/L — ABNORMAL LOW (ref 3.5–5.1)
Sodium: 147 mmol/L — ABNORMAL HIGH (ref 135–145)
Sodium: 143 mmol/L (ref 135–145)
TCO2: 48 mmol/L — ABNORMAL HIGH (ref 22–32)
TCO2: 41 mmol/L — ABNORMAL HIGH (ref 22–32)
pCO2 arterial: 81 mmHg (ref 32–48)
pCO2 arterial: 52.8 mmHg — ABNORMAL HIGH (ref 32–48)
pH, Arterial: 7.359 (ref 7.35–7.45)
pH, Arterial: 7.493 — ABNORMAL HIGH (ref 7.35–7.45)
pO2, Arterial: 503 mmHg — ABNORMAL HIGH (ref 83–108)
pO2, Arterial: 253 mmHg — ABNORMAL HIGH (ref 83–108)

## 2024-02-22 LAB — COMPREHENSIVE METABOLIC PANEL WITH GFR
ALT: 19 U/L (ref 0–44)
AST: 30 U/L (ref 15–41)
Albumin: 4.1 g/dL (ref 3.5–5.0)
Alkaline Phosphatase: 59 U/L (ref 38–126)
BUN: 32 mg/dL — ABNORMAL HIGH (ref 8–23)
CO2: >45 mmol/L — ABNORMAL HIGH (ref 22–32)
Calcium: 9.5 mg/dL (ref 8.9–10.3)
Chloride: 83 mmol/L — ABNORMAL LOW (ref 98–111)
Creatinine, Ser: 0.67 mg/dL (ref 0.61–1.24)
GFR, Estimated: >60 mL/min (ref >60)
Glucose, Bld: 125 mg/dL — ABNORMAL HIGH (ref 70–99)
Potassium: 4.6 mmol/L (ref 3.5–5.1)
Sodium: 143 mmol/L (ref 135–145)
Total Bilirubin: 0.7 mg/dL (ref 0.0–1.2)
Total Protein: 7.7 g/dL (ref 6.5–8.1)

## 2024-02-22 LAB — BASIC METABOLIC PANEL WITH GFR
Anion gap: 25 — ABNORMAL HIGH (ref 5–15)
BUN: 38 mg/dL — ABNORMAL HIGH (ref 8–23)
CO2: 37 mmol/L — ABNORMAL HIGH (ref 22–32)
Calcium: 13.1 mg/dL (ref 8.9–10.3)
Chloride: 88 mmol/L — ABNORMAL LOW (ref 98–111)
Creatinine, Ser: 1.12 mg/dL (ref 0.61–1.24)
GFR, Estimated: >60 mL/min (ref >60)
Glucose, Bld: 183 mg/dL — ABNORMAL HIGH (ref 70–99)
Potassium: 3.8 mmol/L (ref 3.5–5.1)
Sodium: 150 mmol/L — ABNORMAL HIGH (ref 135–145)

## 2024-02-22 LAB — CBC
HCT: 40.7 % (ref 39.0–52.0)
Hemoglobin: 12 g/dL — ABNORMAL LOW (ref 13.0–17.0)
MCH: 32.3 pg (ref 26.0–34.0)
MCHC: 29.5 g/dL — ABNORMAL LOW (ref 30.0–36.0)
MCV: 109.7 fL — ABNORMAL HIGH (ref 80.0–100.0)
Platelets: 130 K/uL — ABNORMAL LOW (ref 150–400)
RBC: 3.71 MIL/uL — ABNORMAL LOW (ref 4.22–5.81)
RDW: 12.2 % (ref 11.5–15.5)
WBC: 9.9 K/uL (ref 4.0–10.5)
nRBC: 0 % (ref 0.0–0.2)

## 2024-02-22 LAB — PROTIME-INR
INR: 0.9 (ref 0.8–1.2)
Prothrombin Time: 12.8 s (ref 11.4–15.2)

## 2024-02-22 LAB — CBC WITH DIFFERENTIAL/PLATELET
Abs Immature Granulocytes: 0.02 K/uL (ref 0.00–0.07)
Basophils Absolute: 0 K/uL (ref 0.0–0.1)
Basophils Relative: 0 %
Eosinophils Absolute: 0 K/uL (ref 0.0–0.5)
Eosinophils Relative: 0 %
HCT: 46.8 % (ref 39.0–52.0)
Hemoglobin: 13.7 g/dL (ref 13.0–17.0)
Immature Granulocytes: 0 %
Lymphocytes Relative: 3 %
Lymphs Abs: 0.3 K/uL — ABNORMAL LOW (ref 0.7–4.0)
MCH: 31.9 pg (ref 26.0–34.0)
MCHC: 29.3 g/dL — ABNORMAL LOW (ref 30.0–36.0)
MCV: 109.1 fL — ABNORMAL HIGH (ref 80.0–100.0)
Monocytes Absolute: 0.5 K/uL (ref 0.1–1.0)
Monocytes Relative: 5 %
Neutro Abs: 8.9 K/uL — ABNORMAL HIGH (ref 1.7–7.7)
Neutrophils Relative %: 92 %
Platelets: 169 K/uL (ref 150–400)
RBC: 4.29 MIL/uL (ref 4.22–5.81)
RDW: 12.2 % (ref 11.5–15.5)
WBC: 9.7 K/uL (ref 4.0–10.5)
nRBC: 0 % (ref 0.0–0.2)

## 2024-02-22 LAB — MRSA NEXT GEN BY PCR, NASAL: MRSA by PCR Next Gen: NOT DETECTED

## 2024-02-22 LAB — CBG MONITORING, ED: Glucose-Capillary: 111 mg/dL — ABNORMAL HIGH (ref 70–99)

## 2024-02-22 LAB — CG4 I-STAT (LACTIC ACID): Lactic Acid, Venous: 15 mmol/L (ref 0.5–1.9)

## 2024-02-22 LAB — AMMONIA: Ammonia: 47 umol/L — ABNORMAL HIGH (ref 9–35)

## 2024-02-22 LAB — I-STAT CG4 LACTIC ACID, ED: Lactic Acid, Venous: 1 mmol/L (ref 0.5–1.9)

## 2024-02-22 LAB — BRAIN NATRIURETIC PEPTIDE: B Natriuretic Peptide: 39.9 pg/mL (ref 0.0–100.0)

## 2024-02-22 MED ORDER — CALCIUM CHLORIDE 10 % IV SOLN
2.0000 g | Freq: Once | INTRAVENOUS | Status: AC
  Administered 2024-02-22: 2 g via INTRAVENOUS

## 2024-02-22 MED ORDER — SODIUM BICARBONATE 8.4 % IV SOLN
INTRAVENOUS | Status: AC
  Filled 2024-02-22: qty 100

## 2024-02-22 MED ORDER — PROPOFOL 1000 MG/100ML IV EMUL
0.0000 ug/kg/min | INTRAVENOUS | Status: DC
  Administered 2024-02-22: 30 ug/kg/min via INTRAVENOUS
  Administered 2024-02-22 (×2): 10 ug/kg/min via INTRAVENOUS
  Administered 2024-02-23: 30 ug/kg/min via INTRAVENOUS
  Administered 2024-02-24 (×2): 40 ug/kg/min via INTRAVENOUS
  Filled 2024-02-22 (×5): qty 100

## 2024-02-22 MED ORDER — LACTATED RINGERS IV BOLUS
500.0000 mL | Freq: Once | INTRAVENOUS | Status: AC
  Administered 2024-02-22: 500 mL via INTRAVENOUS

## 2024-02-22 MED ORDER — NOREPINEPHRINE 16 MG/250ML-% IV SOLN
0.0000 ug/min | INTRAVENOUS | Status: DC
  Administered 2024-02-22: 36 ug/min via INTRAVENOUS
  Administered 2024-02-22: 65 ug/min via INTRAVENOUS
  Administered 2024-02-23: 33 ug/min via INTRAVENOUS
  Administered 2024-02-24: 14 ug/min via INTRAVENOUS
  Filled 2024-02-22: qty 250
  Filled 2024-02-22: qty 500
  Filled 2024-02-22: qty 250

## 2024-02-22 MED ORDER — VASOPRESSIN 20 UNITS/100 ML INFUSION FOR SHOCK
INTRAVENOUS | Status: AC
  Administered 2024-02-22: 0.04 [IU]/min via INTRAVENOUS
  Filled 2024-02-22: qty 100

## 2024-02-22 MED ORDER — STERILE WATER FOR INJECTION IJ SOLN
INTRAMUSCULAR | Status: AC
  Filled 2024-02-22: qty 10

## 2024-02-22 MED ORDER — DOCUSATE SODIUM 100 MG PO CAPS: 100.0000 mg | ORAL_CAPSULE | Freq: Two times a day (BID) | ORAL | Status: DC | PRN

## 2024-02-22 MED ORDER — ETOMIDATE 2 MG/ML IV SOLN
INTRAVENOUS | Status: DC | PRN
  Administered 2024-02-22: 15 mg via INTRAVENOUS

## 2024-02-22 MED ORDER — LIDOCAINE HCL (CARDIAC) PF 100 MG/5ML IV SOSY
PREFILLED_SYRINGE | INTRAVENOUS | Status: AC | PRN
  Administered 2024-02-22: 100 mg via INTRAVENOUS

## 2024-02-22 MED ORDER — IPRATROPIUM-ALBUTEROL 0.5-2.5 (3) MG/3ML IN SOLN: 3.0000 mL | Freq: Once | RESPIRATORY_TRACT | Status: DC

## 2024-02-22 MED ORDER — CALCIUM CHLORIDE 10 % IV SOLN
INTRAVENOUS | Status: AC
  Filled 2024-02-22: qty 20

## 2024-02-22 MED ORDER — METHYLPREDNISOLONE SODIUM SUCC 125 MG IJ SOLR
125.0000 mg | Freq: Once | INTRAMUSCULAR | Status: AC
  Administered 2024-02-22: 125 mg via INTRAVENOUS
  Filled 2024-02-22: qty 2

## 2024-02-22 MED ORDER — CHLORHEXIDINE GLUCONATE CLOTH 2 % EX PADS
6.0000 | MEDICATED_PAD | Freq: Every day | CUTANEOUS | Status: DC
  Administered 2024-02-23 – 2024-02-26 (×4): 6 via TOPICAL

## 2024-02-22 MED ORDER — NOREPINEPHRINE 4 MG/250ML-% IV SOLN
INTRAVENOUS | Status: AC
  Filled 2024-02-22: qty 250

## 2024-02-22 MED ORDER — NOREPINEPHRINE 4 MG/250ML-% IV SOLN: 0.0000 ug/min | INTRAVENOUS | Status: DC

## 2024-02-22 MED ORDER — SODIUM CHLORIDE 0.9 % IV BOLUS
2000.0000 mL | Freq: Once | INTRAVENOUS | Status: AC
  Administered 2024-02-22: 1000 mL via INTRAVENOUS

## 2024-02-22 MED ORDER — POLYETHYLENE GLYCOL 3350 17 G PO PACK: 17.0000 g | PACK | Freq: Every day | ORAL | Status: DC | PRN

## 2024-02-22 MED ORDER — ALBUTEROL (5 MG/ML) CONTINUOUS INHALATION SOLN
15.0000 mg | INHALATION_SOLUTION | RESPIRATORY_TRACT | Status: DC
  Administered 2024-02-22: 15 mg via RESPIRATORY_TRACT
  Filled 2024-02-22: qty 20

## 2024-02-22 MED ORDER — VASOPRESSIN 20 UNITS/100 ML INFUSION FOR SHOCK
0.0000 [IU]/min | INTRAVENOUS | Status: DC
  Administered 2024-02-22 – 2024-02-23 (×2): 0.04 [IU]/min via INTRAVENOUS
  Filled 2024-02-22: qty 100
  Filled 2024-02-22: qty 200

## 2024-02-22 MED ORDER — ACETAZOLAMIDE SODIUM 500 MG IJ SOLR
500.0000 mg | Freq: Four times a day (QID) | INTRAMUSCULAR | Status: DC
  Administered 2024-02-22: 500 mg via INTRAVENOUS
  Filled 2024-02-22 (×4): qty 500

## 2024-02-22 MED ORDER — SODIUM CHLORIDE 0.9 % IV SOLN
1.0000 g | INTRAVENOUS | Status: DC
  Administered 2024-02-22: 1 g via INTRAVENOUS
  Filled 2024-02-22 (×2): qty 10

## 2024-02-22 MED ORDER — ALBUTEROL SULFATE (2.5 MG/3ML) 0.083% IN NEBU
INHALATION_SOLUTION | RESPIRATORY_TRACT | Status: AC
  Administered 2024-02-22: 15 mg
  Filled 2024-02-22: qty 18

## 2024-02-22 MED ORDER — FENTANYL CITRATE PF 50 MCG/ML IJ SOSY
50.0000 ug | PREFILLED_SYRINGE | INTRAMUSCULAR | Status: DC | PRN
  Administered 2024-02-22 – 2024-02-23 (×7): 50 ug via INTRAVENOUS
  Filled 2024-02-22 (×8): qty 1

## 2024-02-22 MED ORDER — METHYLPREDNISOLONE SODIUM SUCC 40 MG IJ SOLR
40.0000 mg | Freq: Two times a day (BID) | INTRAMUSCULAR | Status: DC
  Administered 2024-02-22 – 2024-02-25 (×6): 40 mg via INTRAVENOUS
  Filled 2024-02-22 (×7): qty 1

## 2024-02-22 MED ORDER — HEPARIN SODIUM (PORCINE) 5000 UNIT/ML IJ SOLN
5000.0000 [IU] | Freq: Three times a day (TID) | INTRAMUSCULAR | Status: DC
  Administered 2024-02-22 – 2024-03-04 (×33): 5000 [IU] via SUBCUTANEOUS
  Filled 2024-02-22 (×33): qty 1

## 2024-02-22 MED ORDER — ACETAMINOPHEN 160 MG/5ML PO SOLN
650.0000 mg | Freq: Four times a day (QID) | ORAL | Status: DC | PRN
  Administered 2024-02-22 – 2024-02-23 (×3): 650 mg
  Filled 2024-02-22 (×3): qty 20.3

## 2024-02-22 MED ORDER — LACTATED RINGERS IV SOLN: INTRAVENOUS | Status: AC

## 2024-02-22 MED ORDER — FAMOTIDINE 20 MG PO TABS
20.0000 mg | ORAL_TABLET | Freq: Two times a day (BID) | ORAL | Status: DC
  Administered 2024-02-22 – 2024-02-25 (×6): 20 mg
  Filled 2024-02-22 (×6): qty 1

## 2024-02-22 MED ORDER — ROCURONIUM BROMIDE 10 MG/ML (PF) SYRINGE
1.0000 mg/kg | PREFILLED_SYRINGE | Freq: Once | INTRAVENOUS | Status: AC
  Administered 2024-02-22: 43 mg via INTRAVENOUS

## 2024-02-22 MED ORDER — SODIUM CHLORIDE 0.9 % IV BOLUS: 1000.0000 mL | Freq: Once | INTRAVENOUS | Status: DC

## 2024-02-22 MED ORDER — SODIUM BICARBONATE 8.4 % IV SOLN
INTRAVENOUS | Status: AC | PRN
  Administered 2024-02-22: 50 meq via INTRAVENOUS

## 2024-02-22 MED ORDER — SODIUM CHLORIDE 0.9 % IV SOLN
500.0000 mg | INTRAVENOUS | Status: AC
  Administered 2024-02-22 – 2024-02-28 (×7): 500 mg via INTRAVENOUS
  Filled 2024-02-22 (×8): qty 5

## 2024-02-22 MED ORDER — ROCURONIUM BROMIDE 10 MG/ML (PF) SYRINGE
PREFILLED_SYRINGE | INTRAVENOUS | Status: DC | PRN
  Administered 2024-02-22: 50 mg via INTRAVENOUS

## 2024-02-22 MED ORDER — CALCIUM CHLORIDE 10 % IV SOLN
INTRAVENOUS | Status: AC | PRN
  Administered 2024-02-22: 1 g via INTRAVENOUS

## 2024-02-22 MED ORDER — IPRATROPIUM-ALBUTEROL 0.5-2.5 (3) MG/3ML IN SOLN
3.0000 mL | Freq: Four times a day (QID) | RESPIRATORY_TRACT | Status: DC
  Administered 2024-02-22 – 2024-02-24 (×7): 3 mL via RESPIRATORY_TRACT
  Filled 2024-02-22 (×7): qty 3

## 2024-02-22 MED ORDER — INSULIN ASPART 100 UNIT/ML IJ SOLN
0.0000 [IU] | INTRAMUSCULAR | Status: DC
  Administered 2024-02-22: 11 [IU] via SUBCUTANEOUS
  Administered 2024-02-22: 5 [IU] via SUBCUTANEOUS
  Administered 2024-02-23: 3 [IU] via SUBCUTANEOUS
  Administered 2024-02-23 (×3): 2 [IU] via SUBCUTANEOUS

## 2024-02-22 MED ORDER — ETOMIDATE 2 MG/ML IV SOLN
0.3000 mg/kg | Freq: Once | INTRAVENOUS | Status: AC
  Administered 2024-02-22: 14.1 mg via INTRAVENOUS

## 2024-02-22 MED ORDER — EPINEPHRINE 1 MG/10ML IJ SOSY
PREFILLED_SYRINGE | INTRAMUSCULAR | Status: AC | PRN
  Administered 2024-02-22 (×3): 1 mg via INTRAVENOUS

## 2024-02-22 MED ORDER — SODIUM BICARBONATE 8.4 % IV SOLN
100.0000 meq | Freq: Once | INTRAVENOUS | Status: AC
  Administered 2024-02-22: 100 meq via INTRAVENOUS

## 2024-02-22 NOTE — Progress Notes (Signed)
   02/22/24 1533  Spiritual Encounters  Type of Visit Follow up  Care provided to: Pt and family  Referral source Code page  Reason for visit Code  OnCall Visit Yes   Responded to code blue. Remained with daughter and son while interdisciplinary team worked with patient. Provided support and prayer.

## 2024-02-22 NOTE — Procedures (Signed)
 Arterial Catheter Insertion Procedure Note  Anthony Nelson  [id number]  October 29, 1960  Date:02/22/24  Time:3:51 PM    Provider Performing: Valinda Novas    Procedure: Insertion of Arterial Line (63379) with US  guidance (23062)   Indication(s) Blood pressure monitoring and/or need for frequent ABGs  Consent Risks of the procedure as well as the alternatives and risks of each were explained to the patient and/or caregiver.  Consent for the procedure was obtained and is signed in the bedside chart  Anesthesia None   Time Out Verified patient identification, verified procedure, site/side was marked, verified correct patient position, special equipment/implants available, medications/allergies/relevant history reviewed, required imaging and test results available.   Sterile Technique Maximal sterile technique including full sterile barrier drape, hand hygiene, sterile gown, sterile gloves, mask, hair covering, sterile ultrasound probe cover (if used).   Procedure Description Area of catheter insertion was cleaned with chlorhexidine  and draped in sterile fashion. With real-time ultrasound guidance an arterial catheter was placed into the left Axillary artery.  Appropriate arterial tracings confirmed on monitor.     Complications/Tolerance None; patient tolerated the procedure well.   EBL Minimal   Specimen(s) None

## 2024-02-22 NOTE — Progress Notes (Signed)
   02/22/24 2019  Spiritual Encounters  Type of Visit Follow up  Care provided to: [facility] partners present during encounter Physician  Reason for visit Routine spiritual support  OnCall Visit Yes   Follow up visit with patient and family, continuing support after code blue. Daughter and son present,

## 2024-02-22 NOTE — Procedures (Signed)
 Cardiopulmonary Resuscitation Note  Anthony Nelson  [id number]  [birth year]  Date:02/22/24  Time:3:49 PM   Provider Performing:Janina Trafton   Procedure: Cardiopulmonary Resuscitation (92950)  Indication(s) Loss of Pulse  Consent N/A  Anesthesia N/A   Time Out N/A   Sterile Technique Hand hygiene, gloves   Procedure Description Called to patient's room for CODE BLUE. Initial rhythm was PEA/Asystole. Patient received high quality chest compressions for 9 minutes with defibrillation or cardioversion when appropriate. Epinephrine  was administered every 3 minutes as directed by time Biomedical engineer. Additional pharmacologic interventions included calcium  chloride, sodium bicarbonate , and lidocaine . Additional procedural interventions include intra-osseus line.  Return of spontaneous circulation was achieved.  Family at bedside.   Complications/Tolerance N/A   EBL N/A   Specimen(s) N/A  Estimated time to ROSC: 

## 2024-02-22 NOTE — Procedures (Signed)
 Central Venous Catheter Insertion Procedure Note  CAYDE HELD  [id number]  1960/09/26  Date:02/22/24  Time:3:51 PM   Provider Performing:Ioanna Colquhoun   Procedure: Insertion of Non-tunneled Central Venous [phone]) with US  guidance (23062)   Indication(s) Medication administration  Consent Risks of the procedure as well as the alternatives and risks of each were explained to the patient and/or caregiver.  Consent for the procedure was obtained and is signed in the bedside chart  Anesthesia Topical only with 1% lidocaine    Timeout Verified patient identification, verified procedure, site/side was marked, verified correct patient position, special equipment/implants available, medications/allergies/relevant history reviewed, required imaging and test results available.  Sterile Technique Maximal sterile technique including full sterile barrier drape, hand hygiene, sterile gown, sterile gloves, mask, hair covering, sterile ultrasound probe cover (if used).  Procedure Description Area of catheter insertion was cleaned with chlorhexidine  and draped in sterile fashion.  With real-time ultrasound guidance a central venous catheter was placed into the left subclavian vein. Nonpulsatile blood flow and easy flushing noted in all ports.  The catheter was sutured in place and sterile dressing applied.  Complications/Tolerance None; patient tolerated the procedure well. Chest X-ray is ordered to verify placement for internal jugular or subclavian cannulation.   Chest x-ray is not ordered for femoral cannulation.  EBL Minimal  Specimen(s) None

## 2024-02-22 NOTE — ED Notes (Signed)
 Phleb drew and ran ISTAT VBG X2, to many brackets with few results, spoke to RN about redrawing the DG tube to run a ISTAT VBG again. ISTAT Lactic resulted within in normal range.

## 2024-02-22 NOTE — Procedures (Signed)
 Intraosseous Needle Insertion Procedure Note    Date:02/22/24  Time:3:50 PM   Provider Performing:Rasheedah Reis   Procedure: Insertion Intraosseous (63319)  Indication(s) Medication administration  Consent Unable to obtain consent due to emergent nature of procedure.  Anesthesia Topical only with 1% lidocaine    Timeout Verified patient identification, verified procedure, site/side was marked, verified correct patient position, special equipment/implants available, medications/allergies/relevant history reviewed, required imaging and test results available.  Procedure Description Area of needle insertion was cleaned with chlorhexidine . Intraosseous needle was placed into the left tibia. Bone marrow was aspirated and site easily flushed. The needle was secured in place and dressing applied.  Complications/Tolerance None; patient tolerated the procedure well.  EBL Minimal

## 2024-02-22 NOTE — ED Provider Notes (Signed)
 [facility]ERG[facility] DEPARTMENT AT [facility] Provider Note   CSN: [id number] Arrival date & time: 02/22/24  1124     Patient presents with: Altered Mental Status   Anthony Nelson is a 63 y.o. male with a history of COPD on 3 L nasal cannula presenting from home with altered mental status.  EMS reports they were called out by the patient's home health aide as the patient was minimally responsive today and this was a change in his baseline behavior, rates typically alert and oriented x 4.  The patient will not speak to me or provide any information on arrival.  Reportedly the patient's family felt that something similar happened before.  Per my review of external records and his most recent hospitalization course 1 month ago, he was admitted at that time with COPD, cor pulmonale, heart failure.  He is noted to have protein calorie malnutrition.  The patient was admitted in January of this year after being found unresponsive at home.  He was intubated for severe hypoxic hypercapnic respiratory failure at that time.  He was felt to have acute metabolic encephalopathy in the setting of hypercapnia.  For my review of PDMP, the patient has not been prescribed any recent narcotic medications.  He received a small prescription dose of Xanax  in January, 8 months ago.   HPI     Prior to Admission medications   Medication Sig Start Date End Date Taking? Authorizing Provider  albuterol  (VENTOLIN  HFA) 108 (90 Base) MCG/ACT inhaler Inhale 2 puffs into the lungs every 6 hours as needed for wheezing or shortness of breath. 07/12/23   Jillian Buttery, MD  aspirin  EC 81 MG tablet Take 81 mg by mouth daily. Swallow whole.    [provider]  Fluticasone -Umeclidin-Vilant (TRELEGY ELLIPTA ) 200-62.5-25 MCG/ACT AEPB Inhale 1 puff into the lungs in the morning and at bedtime.    [provider]  furosemide  (LASIX ) 20 MG tablet Take 20 mg by mouth daily. Patient not taking: Reported  on 01/14/2024    [provider]  GINSENG PO Take 1 capsule by mouth daily.    [provider]  Multiple Vitamins-Minerals (MULTIVITAMIN WITH MINERALS) tablet Take 1 tablet by mouth daily.    [provider]  Nutritional Supplements (ENSURE PO) Take 1 Bottle by mouth 2 (two) times daily as needed (poor appetite).    [provider]  OXYGEN  Inhale into the lungs continuous.    [provider]  rosuvastatin  (CRESTOR ) 40 MG tablet Take 40 mg by mouth daily. Patient not taking: Reported on 01/14/2024    [provider]  VITAMIN E PO Take 1 capsule by mouth daily.    [provider]    Allergies: Erythromycin    Review of Systems  Updated Vital Signs BP 103/68   Pulse (!) 139   Temp 97.6 F (36.4 C) (Rectal)   Resp 18   Ht 6' (1.829 m)   Wt 49.9 kg   SpO2 100%   BMI 14.92 kg/m   Physical Exam Constitutional:      General: He is not in acute distress.    Comments: Thin, frail, cachectic appearing, falling asleep  HENT:     Head: Normocephalic and atraumatic.  Eyes:     Conjunctiva/sclera: Conjunctivae normal.     Pupils: Pupils are equal, round, and reactive to light.  Cardiovascular:     Rate and Rhythm: Regular rhythm. Tachycardia present.  Pulmonary:     Comments: 3L [place] at  100% O2 Rhonchorous breath sounds, difficult to auscultate wheezing, poor respiratory effort Abdominal:     General: There is no distension.     Tenderness: There is no abdominal tenderness.  Skin:    General: Skin is warm and dry.  Neurological:     Mental Status: He is alert.     Comments: Patient will weakly squeeze my hands bilaterally but does not follow commands otherwise.  He is nonverbal     (all labs ordered are listed, but only abnormal results are displayed) Labs Reviewed  COMPREHENSIVE METABOLIC PANEL WITH GFR - Abnormal; Notable for the following components:      Result Value   Chloride 83 (*)    CO2 >45 (*)    Glucose,  Bld 125 (*)    BUN 32 (*)    All other components within normal limits  CBC WITH DIFFERENTIAL/PLATELET - Abnormal; Notable for the following components:   MCV 109.1 (*)    MCHC 29.3 (*)    Neutro Abs 8.9 (*)    Lymphs Abs 0.3 (*)    All other components within normal limits  URINALYSIS, W/ REFLEX TO CULTURE (INFECTION SUSPECTED) - Abnormal; Notable for the following components:   APPearance HAZY (*)    Hgb urine dipstick SMALL (*)    Ketones, ur 5 (*)    Protein, ur 30 (*)    All other components within normal limits  AMMONIA - Abnormal; Notable for the following components:   Ammonia 47 (*)    All other components within normal limits  CBG MONITORING, ED - Abnormal; Notable for the following components:   Glucose-Capillary 111 (*)    All other components within normal limits  MRSA NEXT GEN BY PCR, NASAL  CULTURE, RESPIRATORY W GRAM STAIN  CULTURE, BLOOD (ROUTINE X 2)  CULTURE, BLOOD (ROUTINE X 2)  PROTIME-INR  BRAIN NATRIURETIC PEPTIDE  HEMOGLOBIN A1C  I-STAT CG4 LACTIC ACID, ED  I-STAT VENOUS BLOOD GAS, ED  I-STAT ARTERIAL BLOOD GAS, ED    EKG: EKG Interpretation Date/Time:  Saturday February 22 2024 11:35:31 EDT Ventricular Rate:  94 PR Interval:  137 QRS Duration:  83 QT Interval:  365 QTC Calculation: 457 R Axis:   214  Text Interpretation: Sinus rhythm Right atrial enlargement Right ventricular hypertrophy Confirmed by Cottie Cough [phone]) on 02/22/2024 12:50:09 PM  Radiology: CT Head Wo Contrast Result Date: 02/22/2024 CLINICAL DATA:  Mental status changes. EXAM: CT HEAD WITHOUT CONTRAST TECHNIQUE: Contiguous axial images were obtained from the base of the skull through the vertex without intravenous contrast. RADIATION DOSE REDUCTION: This exam was performed according to the departmental dose-optimization program which includes automated exposure control, adjustment of the mA and/or kV according to patient size and/or use of iterative reconstruction technique.  COMPARISON:  07/07/2023 FINDINGS: Brain: There is no evidence for acute hemorrhage, hydrocephalus, mass lesion, or abnormal extra-axial fluid collection. No definite CT evidence for acute infarction. Vascular: No hyperdense vessel or unexpected calcification. Skull: No evidence for fracture. No worrisome lytic or sclerotic lesion. Sinuses/Orbits: The visualized paranasal sinuses and mastoid air cells are clear. Visualized portions of the globes and intraorbital fat are unremarkable. Other: None. IMPRESSION: No acute intracranial abnormality. Electronically Signed   By: Camellia Candle M.D.   On: 02/22/2024 12:44   DG Chest Port 1 View Result Date: 02/22/2024 CLINICAL DATA:  Questionable sepsis. EXAM: PORTABLE CHEST 1 VIEW COMPARISON:  01/13/2024 FINDINGS: Lungs are markedly hyperexpanded. The lungs are clear without focal pneumonia, edema, pneumothorax or pleural  effusion. Interstitial markings are diffusely coarsened with chronic features. The cardiopericardial silhouette is within normal limits for size. No acute bony abnormality. Telemetry leads overlie the chest. IMPRESSION: Hyperexpansion with chronic interstitial coarsening. No acute cardiopulmonary findings. Electronically Signed   By: Camellia Candle M.D.   On: 02/22/2024 12:42     Date/Time: 02/22/2024 2:43 PM  Performed by: Cottie Donnice PARAS, MDLaryngoscope Size: Glidescope and 3 Tube size: 7.5 mm Number of attempts: 1 Airway Equipment and Method: Video-laryngoscopy Placement Confirmation: CO2 detector, Breath sounds checked- equal and bilateral and ETT inserted through vocal cords under direct vision Secured at: 24 cm Tube secured with: ETT holder    .Critical Care  Performed by: Cottie Donnice PARAS, MD Authorized by: Cottie Donnice PARAS, MD   Critical care provider statement:    Critical care time (minutes):  40   Critical care time was exclusive of:  Separately billable procedures and treating other patients   Critical care was  necessary to treat or prevent imminent or life-threatening deterioration of the following conditions:  Respiratory failure   Critical care was time spent personally by me on the following activities:  Ordering and performing treatments and interventions, ordering and review of laboratory studies, ordering and review of radiographic studies, pulse oximetry, review of old charts, examination of patient and evaluation of patient's response to treatment   Care discussed with: admitting provider      Medications Ordered in the ED  methylPREDNISolone  sodium succinate (SOLU-MEDROL ) 125 mg/2 mL injection 125 mg (has no administration in time range)  propofol  (DIPRIVAN ) 1000 MG/100ML infusion (30 mcg/kg/min  49.9 kg Intravenous Bolus 02/22/24 1358)  sodium chloride  0.9 % bolus 2,000 mL (has no administration in time range)  albuterol  (PROVENTIL ,VENTOLIN ) solution continuous neb (15 mg Nebulization New Bag/Given 02/22/24 1408)  Chlorhexidine  Gluconate Cloth 2 % PADS 6 each (has no administration in time range)  docusate sodium  (COLACE) capsule 100 mg (has no administration in time range)  polyethylene glycol (MIRALAX  / GLYCOLAX ) packet 17 g (has no administration in time range)  heparin  injection 5,000 Units (has no administration in time range)  famotidine  (PEPCID ) tablet 20 mg (has no administration in time range)  insulin  aspart (novoLOG ) injection 0-15 Units (has no administration in time range)  lactated ringers  infusion (has no administration in time range)  acetaZOLAMIDE  (DIAMOX ) injection 500 mg (has no administration in time range)  cefTRIAXone  (ROCEPHIN ) 1 g in sodium chloride  0.9 % 100 mL IVPB (has no administration in time range)  azithromycin  (ZITHROMAX ) 500 mg in sodium chloride  0.9 % 250 mL IVPB (has no administration in time range)  fentaNYL  (SUBLIMAZE ) injection 50 mcg (has no administration in time range)  methylPREDNISolone  sodium succinate (SOLU-MEDROL ) 40 mg/mL injection 40 mg (has no  administration in time range)  ipratropium-albuterol  (DUONEB) 0.5-2.5 (3) MG/3ML nebulizer solution 3 mL (has no administration in time range)  etomidate  (AMIDATE ) injection 14.1 mg (14.1 mg Intravenous Given 02/22/24 1334)  rocuronium  (ZEMURON ) injection 43 mg (43 mg Intravenous Given 02/22/24 1335)  albuterol  (PROVENTIL ) (2.5 MG/3ML) 0.083% nebulizer solution (15 mg  Given 02/22/24 1407)    Clinical Course as of 02/22/24 1446  Sat Feb 22, 2024  1246 Normal temp, lactate, WBC - indicates sepsis is quite unlikely [MT]  1259 I spoke to the patient's daughter in law who identifies as his medical decision maker and is en route to the hospital.  I discussed his respiratory failure - he's likeyl too obtunded for bipap and would need intubation.  She reports  okay to proceed with intubation [MT]  1300 Venous gas pCO2 > 110 [MT]  1338 ICU consulted. Patient intubated.  Requesting repeat VBG in 20-30 min from RT [MT]  1401 Elevated peak pressure on ventilator sent to RR 20, Tidal volume 500 - increased sedation as patient tachycardic, may be biting vent - after propofol  bolus, BP dropped.  Ordered IV fluids (he hasn't received any yet).  Xray shows no PTX but hyperinflated lungs.  I question breath stacking, will notify PCCM who has assumed care, might consider lowering RR [MT]  1409 Dr Harold critical care aware of ventilator issue and will be placing orders with RT. Pt admitted to ICU; further care per critical care team [MT]    Clinical Course User Index [MT] Vester Titsworth, Donnice PARAS, MD                                 Medical Decision Making Amount and/or Complexity of Data Reviewed Labs: ordered. Radiology: ordered.  Risk Prescription drug management. Decision regarding hospitalization.   This patient presents to the ED with concern for altered mental status. This involves an extensive number of treatment options, and is a complaint that carries with it a high risk of complications and morbidity.   The differential diagnosis includes infection versus aspiration versus CVA versus metabolic encephalopathy versus other  Co-morbidities that complicate the patient evaluation: History of COPD  Additional history obtained from EMS  External records from outside source obtained and reviewed including hospital discharge summary as noted above  I ordered and personally interpreted labs.  The pertinent results include: Lactate and white blood cell count within normal is.  Minor elevation of ammonia 47.  CMP showing normal creatinine level, elevated bicarb.  Venous gas with acidosis noting pH 7.1, pco2 > 110.  I ordered imaging studies including CT head, x-ray of the chest I independently visualized and interpreted imaging which showed no emergent findings, hyperinflated lung I agree with the radiologist interpretation  The patient was maintained on a cardiac monitor.  I personally viewed and interpreted the cardiac monitored which showed an underlying rhythm of: Sinus tachycardia  Per my interpretation the patient's ECG shows sinus tachycardia  I ordered medication including breathing treatments, steroids for suspected COPD exacerbation.  RSI medications and IV fluids for intubation and hypotension and tachycardia  I have reviewed the patients home medicines and have made adjustments as needed  Test Considered: Low clinical concern for acute pulmonary embolism at this time.  I requested consultation with the critical care,  and discussed lab and imaging findings as well as pertinent plan - they recommend: Admission to ICU  I did discuss the patient's workup and poor prognosis with his daughter-in-law by phone.  See ED course.  We have agreed at this time to move forward with intubation as the family was not prepared to make a full DNI decision.  Unfortunately I did not think that the patient's mentation was well enough to tolerate BiPAP, as he has confused and nearly obtunded.  I suspect he has  hypercapnic respiratory failure as a cause of encephalopathy.  I do not see evidence of sepsis or clear evidence of aspiration.  No indication for antibiotics at this time.  Dispostion:  After consideration of the diagnostic results and the patients response to treatment, I feel that the patent would benefit from ICU admission.      Final diagnoses:  Altered mental status, unspecified  altered mental status type  Acute respiratory failure with hypercapnia [facility])    ED Discharge Orders     None          Cottie Donnice PARAS, MD 02/22/24 1446

## 2024-02-22 NOTE — Progress Notes (Signed)
   02/22/24 1358  Spiritual Encounters  Type of Visit Initial  Care provided to: [facility] partners present during encounter Nurse  Reason for visit Urgent spiritual support  OnCall Visit Yes   Provided spiritual care and prayer to daughter in family waiting room while doctors incubate patient. Awaiting son's arrival.

## 2024-02-22 NOTE — ED Notes (Signed)
 Propofol  started at 2.4.

## 2024-02-22 NOTE — Progress Notes (Addendum)
 eLink Physician-Brief Progress Note Patient Name: Anthony Nelson DOB: 01/30/1961 MRN: [id number]   Date of Service  02/22/2024  HPI/Events of Note  Patient presents with acute on chronic hypoxic hypercapnic respiratory failure in the setting of his severe COPD exacerbation and septic shock secondary to multifocal pneumonia-cachectic at baseline and status post PEA cardiac arrest.  Notified of minimal urine output, on dual pressors, CVP low single digits.  Lactic still 15.  Ionized calcium  improved  eICU Interventions  Maintain ordered LR at 75 cc an hour Add 500 cc LR bolus Continue norepinephrine  and vasopressin  and stress dose steroids as ordered  Anticipating low urine output in the setting of ATN  Reduce respiratory rate to 18, titrate down FiO2 to maintain SPO2 greater than 90   2116 - Diamox  ordered concurrently, hold for now. Vent adjustments reviewed and appropriate, replace electrolytes with AM labs  Intervention Category Intermediate Interventions: Hypotension - evaluation and management  Zoeie Ritter 02/22/2024, 8:29 PM

## 2024-02-22 NOTE — H&P (Signed)
 NAME:  Anthony Nelson, MRN:  [id number], DOB:  1961-04-06, LOS: 0 ADMISSION DATE:  02/22/2024, CONSULTATION DATE: 02/22/2024 REFERRING MD:  Cottie Cough , CHIEF COMPLAINT: Altered mental status  History of Present Illness:  63 year old male with chronic hypoxic/hypercapnic respiratory failure on home oxygen  due to severe COPD was brought into the emergency department after he was found unresponsive by home health aide.  Per patient's family he was in his usual state of health until about 3 weeks ago when he started declining having increasing lethargy, generalized weakness, this morning home health aide found him unresponsive, not able to speak, EMS was summoned and he was brought to the emergency department in the ED patient was intubated due as he was aspirating.    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 rate 18, height 6' (1.829 m), weight 49.9 kg, SpO2 100%.    Vent Mode: PRVC FiO2 (%):  [50 %] 50 % Set Rate:  [22 bmp] 22 bmp Vt Set:  [500 mL-620 mL] 620 mL PEEP:  [5 cmH20] 5 cmH20 Plateau  Pressure:  [34 cmH20] 34 cmH20  No intake or output data in the 24 hours ending 02/22/24 1409 Filed Weights   02/22/24 1330  Weight: 49.9 kg    Examination: General: Crtitically ill-appearing cachectic elderly male, orally intubated HEENT: [place]/AT, eyes anicteric.  ETT and OGT in place Neuro: Sedated, not following commands.  Eyes are closed.  Pupils 3 mm bilateral reactive to light Chest: Send air entry all over, no wheezes or rhonchi Heart: Regular rate and rhythm, no murmurs or gallops Abdomen: Soft, nondistended, bowel sounds present  Labs and images reviewed  Patient Lines/Drains/Airways Status     Active Line/Drains/Airways     Name Placement date Placement time Site Days   Peripheral IV 02/22/24 22 G Left;Posterior Forearm 02/22/24  1131  Forearm  less than 1   Airway 7.5 mm 02/22/24  1328  -- less than 1   Intraosseous Line 02/22/24 02/22/24  1430  --  less than 1         Resolved problem list   Assessment and Plan  Acute on chronic hypoxic/hypercapnic respiratory failure Acute severe COPD exacerbation Severe sepsis septic shock due to bilateral multifocal pneumonia, POA Acute septic encephalopathy Severe metabolic alkalosis with respiratory compensation Cachexia/severe protein calorie malnutrition Status post PEA cardiac arrest in the setting of hypoxia  Continued on protective ventilation VAP prevention bundle in place PAD protocol with propofol  and fentanyl  Continue IV steroid and scheduled nebs Continue IV antibiotics Follow-up respiratory culture Continue IV fluid Started on acetazolamide  Dietitian consulted Patient went into  PEA cardiac arrest, ROSC achieved after 9 minutes of CPR He is hypotensive, continue vasopressor support with MAP goal 65 Currently on Levophed  and vasopressin     Labs   CBC: Recent Labs  Lab 02/22/24 1145  WBC 9.7  NEUTROABS 8.9*  HGB 13.7  HCT 46.8  MCV 109.1*  PLT 169    Basic Metabolic Panel: Recent Labs  Lab  02/22/24 1145  NA 143  K 4.6  CL 83*  CO2 >45*  GLUCOSE 125*  BUN 32*  CREATININE 0.67  CALCIUM  9.5   GFR: Estimated Creatinine Clearance: 65.8 mL/min (by C-G formula based on SCr of 0.67 mg/dL). Recent Labs  Lab 02/22/24 1145 02/22/24 1209  WBC 9.7  --   LATICACIDVEN  --  1.0    Liver Function Tests: Recent Labs  Lab 02/22/24 1145  AST 30  ALT 19  ALKPHOS 59  BILITOT 0.7  PROT 7.7  ALBUMIN 4.1   No results for input(s): LIPASE, AMYLASE in the last 168 hours. Recent Labs  Lab 02/22/24 1146  AMMONIA 47*    ABG    Component Value Date/Time   PHART 7.550 (H) 07/07/2023 2046   PCO2ART 55.1 (H) 07/07/2023 2046   PO2ART 64 (L) 07/07/2023 2046   HCO3 47.6 (H) 01/13/2024 1723   TCO2 50 (H) 01/13/2024 1723   O2SAT 50 01/13/2024 1723     Coagulation Profile: Recent Labs  Lab 02/22/24 1145  INR 0.9    Cardiac Enzymes: No results for input(s): CKTOTAL, CKMB, CKMBINDEX, TROPONINI in the last 168 hours.  HbA1C: Hgb A1c MFr Bld  Date/Time Value Ref Range Status  07/08/2023 02:34 AM 5.6 4.8 - 5.6 % Final    Comment:    (NOTE)         Prediabetes: 5.7 - 6.4         Diabetes: >6.4         Glycemic control for adults with diabetes: <7.0   03/08/2020 07:00 PM 5.9 (H) 4.8 - 5.6 % Final    Comment:    (NOTE) Pre diabetes:          5.7%-6.4%  Diabetes:              >6.4%  Glycemic control for   <7.0% adults with diabetes     CBG: Recent Labs  Lab 02/22/24 1134  GLUCAP 111*    Review of Systems:   Unable to obtain as patient is intubated  Past Medical History:  He,  has a past medical history of Atherosclerosis of aorta (HCC) (04/06/2020), Bronchitis, COPD (chronic obstructive pulmonary disease) (HCC), Diastolic heart failure (HCC), GERD (gastroesophageal reflux disease), Pulmonary hypertension (HCC), 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    Surgical History:   Past Surgical History:  Procedure Laterality Date   HERNIA REPAIR       Social History:   reports that he quit smoking about 3 years ago. His smoking use included cigarettes. He started smoking about 47 years ago. He has a 4.4 pack-year smoking history. He has never used smokeless tobacco. He reports that he does not currently use alcohol. He reports that he does not use drugs.   Family History:  His family history includes CAD in his father; Cancer in his father and mother; Diabetes in his maternal grandmother; Heart attack in his paternal grandfather; Heart failure in his father; Kidney cancer in his paternal grandmother; Kidney disease in his maternal grandmother.  Allergies Allergies  Allergen Reactions   Erythromycin Other (See Comments)    Allergy from childhood- does not take it     Home Medications  Prior to Admission medications   Medication Sig Start Date End Date Taking? Authorizing Provider  albuterol  (VENTOLIN  HFA) 108 (90 Base) MCG/ACT inhaler Inhale 2 puffs into the lungs every 6 hours as needed for wheezing or shortness of breath. 07/12/23   Jillian Buttery, MD  aspirin  EC 81 MG tablet Take 81 mg by mouth daily. Swallow whole.    [provider]  Fluticasone -Umeclidin-Vilant (TRELEGY ELLIPTA ) 200-62.5-25 MCG/ACT AEPB Inhale 1 puff into the lungs in the morning and at bedtime.    [provider]  furosemide  (LASIX ) 20 MG tablet Take 20 mg by mouth daily. Patient not taking: Reported on 01/14/2024    [provider]  GINSENG PO Take 1 capsule by mouth daily.    [provider]  Multiple Vitamins-Minerals (MULTIVITAMIN WITH MINERALS) tablet Take 1 tablet by mouth daily.    [provider]  Nutritional Supplements (ENSURE PO) Take 1 Bottle by mouth 2 (two) times daily as needed (poor appetite).    [provider]  OXYGEN  Inhale into the lungs continuous.    [provider]  rosuvastatin   (CRESTOR ) 40 MG tablet Take 40 mg by mouth daily. Patient not taking: Reported on 01/14/2024    [provider]  VITAMIN E PO Take 1 capsule by mouth daily.    [provider]     Critical care time:     The patient is critically ill due to acute on chronic respiratory failure with hypoxia and hypercapnia/cardiac arrest..  Critical care was necessary to treat or prevent imminent or life-threatening deterioration.  Critical care was time spent personally by me on the following activities: development of treatment plan with patient and/or surrogate as well as nursing, discussions with consultants, evaluation of patient's response to treatment, examination of patient, obtaining history from patient or surrogate, ordering and performing treatments and interventions, ordering and review of laboratory studies, ordering and review of radiographic studies, pulse oximetry, re-evaluation of patient's condition and participation in multidisciplinary rounds.   During this encounter critical care time was devoted to patient care services described in this note for 52 minutes.     Valinda Novas, MD [facility] See Amion for pager If no response to pager, please call [phone] until 7pm After 7pm, Please call E-link [phone]

## 2024-02-22 NOTE — ED Triage Notes (Signed)
 Patient BIB EMS from home after home health aide was at the patient's home and called EMS due to AMS. Patient is usually AXOX4 and home health aide is used only for simple home tasks and showering and patient does the rest himself. Patient is only alert to verbal. Tremors noticeable to EMS upon arrival and during triage, EMS states it is an off and on thing. Patient's family states something like this has happened before, but they cannot recall what was the issue was that caused it.

## 2024-02-23 DIAGNOSIS — I469 Cardiac arrest, cause unspecified: Secondary | ICD-10-CM | POA: Diagnosis not present

## 2024-02-23 DIAGNOSIS — J441 Chronic obstructive pulmonary disease with (acute) exacerbation: Secondary | ICD-10-CM | POA: Diagnosis not present

## 2024-02-23 DIAGNOSIS — J9621 Acute and chronic respiratory failure with hypoxia: Secondary | ICD-10-CM | POA: Diagnosis not present

## 2024-02-23 DIAGNOSIS — E876 Hypokalemia: Secondary | ICD-10-CM

## 2024-02-23 DIAGNOSIS — D649 Anemia, unspecified: Secondary | ICD-10-CM

## 2024-02-23 DIAGNOSIS — J9622 Acute and chronic respiratory failure with hypercapnia: Secondary | ICD-10-CM | POA: Diagnosis not present

## 2024-02-23 LAB — BASIC METABOLIC PANEL WITH GFR
Anion gap: 14 (ref 5–15)
Anion gap: 10 (ref 5–15)
BUN: 39 mg/dL — ABNORMAL HIGH (ref 8–23)
BUN: 35 mg/dL — ABNORMAL HIGH (ref 8–23)
CO2: 38 mmol/L — ABNORMAL HIGH (ref 22–32)
CO2: 33 mmol/L — ABNORMAL HIGH (ref 22–32)
Calcium: 9.9 mg/dL (ref 8.9–10.3)
Calcium: 8.7 mg/dL — ABNORMAL LOW (ref 8.9–10.3)
Chloride: 90 mmol/L — ABNORMAL LOW (ref 98–111)
Chloride: 94 mmol/L — ABNORMAL LOW (ref 98–111)
Creatinine, Ser: 0.9 mg/dL (ref 0.61–1.24)
Creatinine, Ser: 0.7 mg/dL (ref 0.61–1.24)
GFR, Estimated: >60 mL/min (ref >60)
GFR, Estimated: >60 mL/min (ref >60)
Glucose, Bld: 133 mg/dL — ABNORMAL HIGH (ref 70–99)
Glucose, Bld: 133 mg/dL — ABNORMAL HIGH (ref 70–99)
Potassium: 2.8 mmol/L — ABNORMAL LOW (ref 3.5–5.1)
Potassium: 4.1 mmol/L (ref 3.5–5.1)
Sodium: 142 mmol/L (ref 135–145)
Sodium: 137 mmol/L (ref 135–145)

## 2024-02-23 LAB — POCT I-STAT 7, (LYTES, BLD GAS, ICA,H+H)
Acid-Base Excess: 18 mmol/L — ABNORMAL HIGH (ref 0.0–2.0)
Bicarbonate: 44 mmol/L — ABNORMAL HIGH (ref 20.0–28.0)
Calcium, Ion: 1.24 mmol/L (ref 1.15–1.40)
HCT: 35 % — ABNORMAL LOW (ref 39.0–52.0)
Hemoglobin: 11.9 g/dL — ABNORMAL LOW (ref 13.0–17.0)
O2 Saturation: 98 %
Patient temperature: 37.8
Potassium: 2.8 mmol/L — ABNORMAL LOW (ref 3.5–5.1)
Sodium: 142 mmol/L (ref 135–145)
TCO2: 46 mmol/L — ABNORMAL HIGH (ref 22–32)
pCO2 arterial: 59 mmHg — ABNORMAL HIGH (ref 32–48)
pH, Arterial: 7.483 — ABNORMAL HIGH (ref 7.35–7.45)
pO2, Arterial: 114 mmHg — ABNORMAL HIGH (ref 83–108)

## 2024-02-23 LAB — CBC
HCT: 34.8 % — ABNORMAL LOW (ref 39.0–52.0)
Hemoglobin: 11 g/dL — ABNORMAL LOW (ref 13.0–17.0)
MCH: 32 pg (ref 26.0–34.0)
MCHC: 31.6 g/dL (ref 30.0–36.0)
MCV: 101.2 fL — ABNORMAL HIGH (ref 80.0–100.0)
Platelets: 124 K/uL — ABNORMAL LOW (ref 150–400)
RBC: 3.44 MIL/uL — ABNORMAL LOW (ref 4.22–5.81)
RDW: 12.5 % (ref 11.5–15.5)
WBC: 11.4 K/uL — ABNORMAL HIGH (ref 4.0–10.5)
nRBC: 0 % (ref 0.0–0.2)

## 2024-02-23 LAB — HEPATIC FUNCTION PANEL
ALT: 292 U/L — ABNORMAL HIGH (ref 0–44)
AST: 215 U/L — ABNORMAL HIGH (ref 15–41)
Albumin: 3 g/dL — ABNORMAL LOW (ref 3.5–5.0)
Alkaline Phosphatase: 53 U/L (ref 38–126)
Bilirubin, Direct: 0.1 mg/dL (ref 0.0–0.2)
Indirect Bilirubin: 0.6 mg/dL (ref 0.3–0.9)
Total Bilirubin: 0.7 mg/dL (ref 0.0–1.2)
Total Protein: 5.8 g/dL — ABNORMAL LOW (ref 6.5–8.1)

## 2024-02-23 LAB — GLUCOSE, CAPILLARY
Glucose-Capillary: 112 mg/dL — ABNORMAL HIGH (ref 70–99)
Glucose-Capillary: 136 mg/dL — ABNORMAL HIGH (ref 70–99)
Glucose-Capillary: 147 mg/dL — ABNORMAL HIGH (ref 70–99)
Glucose-Capillary: 112 mg/dL — ABNORMAL HIGH (ref 70–99)
Glucose-Capillary: 130 mg/dL — ABNORMAL HIGH (ref 70–99)
Glucose-Capillary: 54 mg/dL — ABNORMAL LOW (ref 70–99)
Glucose-Capillary: 57 mg/dL — ABNORMAL LOW (ref 70–99)

## 2024-02-23 LAB — TRIGLYCERIDES: Triglycerides: 43 mg/dL (ref <150)

## 2024-02-23 LAB — PHOSPHORUS: Phosphorus: 2.5 mg/dL (ref 2.5–4.6)

## 2024-02-23 LAB — CG4 I-STAT (LACTIC ACID): Lactic Acid, Venous: 2.9 mmol/L (ref 0.5–1.9)

## 2024-02-23 LAB — MAGNESIUM: Magnesium: 1.8 mg/dL (ref 1.7–2.4)

## 2024-02-23 MED ORDER — POTASSIUM CHLORIDE 10 MEQ/100ML IV SOLN
10.0000 meq | INTRAVENOUS | Status: AC
  Administered 2024-02-23 (×6): 10 meq via INTRAVENOUS
  Filled 2024-02-23 (×6): qty 100

## 2024-02-23 MED ORDER — SODIUM CHLORIDE 0.9 % IV SOLN
2.0000 g | INTRAVENOUS | Status: AC
  Administered 2024-02-23 – 2024-02-29 (×7): 2 g via INTRAVENOUS
  Filled 2024-02-23 (×7): qty 20

## 2024-02-23 MED ORDER — HALOPERIDOL LACTATE 5 MG/ML IJ SOLN
5.0000 mg | INTRAMUSCULAR | Status: DC | PRN
  Administered 2024-02-23: 5 mg via INTRAVENOUS
  Filled 2024-02-23: qty 1

## 2024-02-23 MED ORDER — MAGNESIUM SULFATE 2 GM/50ML IV SOLN
2.0000 g | Freq: Once | INTRAVENOUS | Status: AC
  Administered 2024-02-23: 2 g via INTRAVENOUS
  Filled 2024-02-23: qty 50

## 2024-02-23 MED ORDER — POTASSIUM CHLORIDE 20 MEQ PO PACK
40.0000 meq | PACK | ORAL | Status: AC
  Administered 2024-02-23 (×2): 40 meq
  Filled 2024-02-23 (×2): qty 2

## 2024-02-23 MED ORDER — FENTANYL CITRATE PF 50 MCG/ML IJ SOSY
50.0000 ug | PREFILLED_SYRINGE | INTRAMUSCULAR | Status: DC | PRN
  Administered 2024-02-23 – 2024-02-24 (×2): 50 ug via INTRAVENOUS
  Filled 2024-02-23 (×2): qty 1

## 2024-02-23 MED ORDER — DEXTROSE 50 % IV SOLN: 12.5000 g | INTRAVENOUS | Status: AC

## 2024-02-23 MED ORDER — LACTATED RINGERS IV BOLUS
1000.0000 mL | Freq: Once | INTRAVENOUS | Status: AC
  Administered 2024-02-23: 1000 mL via INTRAVENOUS

## 2024-02-23 NOTE — Plan of Care (Signed)
  Problem: Clinical Measurements: Goal: Respiratory complications will improve Outcome: Progressing   Problem: Clinical Measurements: Goal: Cardiovascular complication will be avoided Outcome: Progressing   Problem: Clinical Measurements: Goal: Diagnostic test results will improve Outcome: Progressing   Problem: Tissue Perfusion: Goal: Adequacy of tissue perfusion will improve Outcome: Progressing   Problem: Fluid Volume: Goal: Ability to maintain a balanced intake and output will improve Outcome: Progressing

## 2024-02-23 NOTE — Progress Notes (Signed)
 NAME:  Anthony Nelson, MRN:  [id number], DOB:  1961/02/26, LOS: 1 ADMISSION DATE:  02/22/2024, CONSULTATION DATE: 02/22/2024 REFERRING MD:  Cottie Cough , CHIEF COMPLAINT: Altered mental status  History of Present Illness:  63 year old male with chronic hypoxic/hypercapnic respiratory failure on home oxygen  due to severe COPD was brought into the emergency department after he was found unresponsive by home health aide.  Per patient's family he was in his usual state of health until about 3 weeks ago when he started declining having increasing lethargy, generalized weakness, this morning home health aide found him unresponsive, not able to speak, EMS was summoned and he was brought to the emergency department in the ED patient was intubated due as he was aspirating.    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9 F (37.7 C), resp. rate 18, height 5' 9 (1.753 m), weight 50.3 kg, SpO2 100%. CVP:  [5 mmHg-7 mmHg] 5 mmHg  Vent Mode: PRVC FiO2 (%):  [40 %-100 %] 40 % Set Rate:  [18 bmp-24 bmp] 18 bmp Vt Set:  [400 mL-620 mL] 400 mL PEEP:  [5 cmH20-10 cmH20] 8 cmH20 Plateau Pressure:  [14 cmH20-44 cmH20] 14 cmH20   Intake/Output Summary (Last 24 hours) at 02/23/2024 0743 Last data filed at 02/23/2024 0736 Gross per 24 hour  Intake 2946.45 ml  Output 1105 ml  Net 1841.45 ml   Filed Weights   02/22/24 1330 02/23/24 0600  Weight: 49.9 kg 50.3 kg    Examination: General: Crtitically ill-appearing cachectic male, orally intubated HEENT: [place]/AT, eyes anicteric.  ETT and OGT in place Neuro: Eyes closed, trying to open his eyes with vocal stimuli, grimacing with painful stimuli Chest: Absent air entry all over, no wheezes Heart: Regular rate and rhythm, no murmurs or gallops Abdomen: Soft, nondistended, bowel sounds present   Labs and images reviewed  Patient Lines/Drains/Airways Status     Active Line/Drains/Airways     Name Placement date Placement time Site Days   Arterial Line 02/22/24 Left Radial 02/22/24  --  Radial  1   Peripheral IV 02/22/24 22 G Left;Posterior Forearm 02/22/24  1131  Forearm  1   CVC Triple Lumen 02/22/24 Left Internal jugular 02/22/24  1530  -- 1   NG/OG Vented/Dual Lumen Oral 02/22/24  --  Oral  1   Urethral Catheter Elsie Council RN Temperature probe 16 Fr. 02/23/24  9884  Temperature probe  less than 1   Airway 7.5 mm 02/22/24  1328  -- 1        Resolved problem list   Assessment and Plan  Acute on chronic hypoxic/hypercapnic respiratory failure Acute severe COPD exacerbation Severe sepsis septic shock due to bilateral multifocal pneumonia, POA Acute septic encephalopathy Mixed metabolic disorder including metabolic alkalosis/metabolic acidosis/with compensating respiratory  acidosis Cachexia/severe protein calorie malnutrition Status post PEA cardiac arrest in the setting of hypoxia Hypokalemia Anemia and thrombocytopenia of critical illness  Continued on protective ventilation VAP prevention bundle in place Sedation was turned off this morning, patient is grimacing, trying to open his eyes He has barrel-shaped chest, very difficult to hear breath sounds Continue IV steroid and scheduled nebs Continue IV antibiotics with ceftriaxone  azithromycin  Blood culture and respiratory cultures are pending Continue aggressive IV fluid resuscitation as he looks dry Continue vasopressor support to maintain MAP goal 65, currently on 20 mics of Levophed  and vasopressin  at 0.04 Will consult dietitian, currently his OGT is on low intermittent wall suction Repeat lactate pending Patient went into PEA cardiac arrest, ROSC achieved after 9 minutes of CPR Continue aggressive electrolyte replacement Monitor H&H and platelet count   Labs   CBC: Recent Labs  Lab 02/22/24 1145 02/22/24 1552 02/22/24 1606 02/22/24 2023 02/23/24 0349  WBC 9.7  --  9.9  --  11.4*  NEUTROABS 8.9*  --   --   --   --   HGB 13.7 12.6* 12.0* 11.9* 11.0*  11.9*  HCT 46.8 37.0* 40.7 35.0* 34.8*  35.0*  MCV 109.1*  --  109.7*  --  101.2*  PLT 169  --  130*  --  124*    Basic Metabolic Panel: Recent Labs  Lab 02/22/24 1145 02/22/24 1552 02/22/24 1606 02/22/24 2023 02/23/24 0349  NA 143 147* 150* 143 142  142  K 4.6 3.3* 3.8 2.8* 2.8*  2.8*  CL 83*  --  88*  --  90*  CO2 >45*  --  37*  --  38*  GLUCOSE 125*  --  183*  --  133*  BUN 32*  --  38*  --  39*  CREATININE 0.67  --  1.12  --  0.90  CALCIUM  9.5  --  13.1*  --  9.9  MG  --   --   --   --  1.8  PHOS  --   --   --   --  2.5   GFR: Estimated Creatinine Clearance: 59 mL/min (by C-G formula based on SCr of 0.9 mg/dL). Recent Labs  Lab 02/22/24 1145 02/22/24 1209 02/22/24 1606 02/22/24 1607 02/23/24 0349  WBC 9.7  --   9.9  --  11.4*  LATICACIDVEN  --  1.0  --  15.0*  --     Liver Function Tests: Recent Labs  Lab 02/22/24 1145  AST 30  ALT 19  ALKPHOS 59  BILITOT 0.7  PROT 7.7  ALBUMIN 4.1   No results for input(s): LIPASE, AMYLASE in the last 168 hours. Recent Labs  Lab 02/22/24 1146  AMMONIA 47*    ABG    Component Value Date/Time   PHART 7.483 (H) 02/23/2024 0349   PCO2ART 59.0 (H) 02/23/2024 0349   PO2ART 114 (H) 02/23/2024 0349   HCO3 44.0 (H) 02/23/2024 0349   TCO2 46 (H) 02/23/2024 0349   O2SAT 98 02/23/2024 0349     Coagulation Profile: Recent Labs  Lab 02/22/24 1145  INR 0.9  Cardiac Enzymes: No results for input(s): CKTOTAL, CKMB, CKMBINDEX, TROPONINI in the last 168 hours.  HbA1C: Hgb A1c MFr Bld  Date/Time Value Ref Range Status  07/08/2023 02:34 AM 5.6 4.8 - 5.6 % Final    Comment:    (NOTE)         Prediabetes: 5.7 - 6.4         Diabetes: >6.4         Glycemic control for adults with diabetes: <7.0   03/08/2020 07:00 PM 5.9 (H) 4.8 - 5.6 % Final    Comment:    (NOTE) Pre diabetes:          5.7%-6.4%  Diabetes:              >6.4%  Glycemic control for   <7.0% adults with diabetes     CBG: Recent Labs  Lab 02/22/24 1134 02/22/24 1811 02/22/24 2031 02/22/24 2321 02/23/24 0324  GLUCAP 111* 305* 245* 185* 112*   Oh Critical care time:     The patient is critically ill due to acute on chronic respiratory failure with hypoxia and hypercapnia/cardiac arrest..  Critical care was necessary to treat or prevent imminent or life-threatening deterioration.  Critical care was time spent personally by me on the following activities: development of treatment plan with patient and/or surrogate as well as nursing, discussions with consultants, evaluation of patient's response to treatment, examination of patient, obtaining history from patient or surrogate, ordering and performing treatments and interventions, ordering and review of laboratory  studies, ordering and review of radiographic studies, pulse oximetry, re-evaluation of patient's condition and participation in multidisciplinary rounds.   During this encounter critical care time was devoted to patient care services described in this note for 41 minutes.     Valinda Novas, MD [facility] See Amion for pager If no response to pager, please call [phone] until 7pm After 7pm, Please call E-link [phone]

## 2024-02-23 NOTE — Progress Notes (Signed)
 [facility] ADULT ICU REPLACEMENT PROTOCOL   The patient does apply for the [facility] Adult ICU Electrolyte Replacment Protocol based on the criteria listed below:   1.Exclusion criteria: TCTS, ECMO, Dialysis, and Myasthenia Gravis patients 2. Is GFR >/= 30 ml/min? Yes.    Patient's GFR today is >60 3. Is SCr </= 2? Yes.   Patient's SCr is 0.90 mg/dL 4. Did SCr increase >/= 0.5 in 24 hours? No. 5.Pt's weight >40kg  Yes.   6. Abnormal electrolyte(s): K+ 2.8, Mag 1.8  7. Electrolytes replaced per protocol 8.  Call MD STAT for K+ </= 2.5, Phos </= 1, or Mag </= 1 Physician:  Dr. Haze Rummer, [place] ORN 02/23/2024 4:51 AM

## 2024-02-23 NOTE — Progress Notes (Addendum)
 eLink Physician-Brief Progress Note Patient Name: Anthony Nelson DOB: [birth year] MRN: [id number]   Date of Service  02/23/2024  HPI/Events of Note  63 year old male initially presented status post PEA arrest in the setting of acute hypoxic hypercapnic respiratory failure secondary to severe COPD exacerbation septic shock secondary to multifocal pneumonia.  Currently minimize sedation due to inability to wake up during the daytime.  Notified by staff that the patient is detaching himself from the ventilator due to agitation and try to get out of bed, trying to snag at his mittens and is purposeful with his movements although not following directions at this time.  Propofol  was capped 20 mg earlier today, fentanyl  every 2 hours currently.  eICU Interventions  Increase propofol  limit to 40 mg  Increase frequency of analgesia every hours as needed to maintain CPOT scoring  Add Haldol  as an adjunct for agitation   2359 -ordered global hypoglycemia protocol, continue current care otherwise  Intervention Category Minor Interventions: Agitation / anxiety - evaluation and management  Anthony Nelson 02/23/2024, 9:42 PM

## 2024-02-24 DIAGNOSIS — J189 Pneumonia, unspecified organism: Secondary | ICD-10-CM

## 2024-02-24 DIAGNOSIS — R6521 Severe sepsis with septic shock: Secondary | ICD-10-CM

## 2024-02-24 DIAGNOSIS — Z515 Encounter for palliative care: Secondary | ICD-10-CM

## 2024-02-24 DIAGNOSIS — J441 Chronic obstructive pulmonary disease with (acute) exacerbation: Secondary | ICD-10-CM | POA: Diagnosis not present

## 2024-02-24 DIAGNOSIS — A419 Sepsis, unspecified organism: Secondary | ICD-10-CM | POA: Diagnosis not present

## 2024-02-24 DIAGNOSIS — I469 Cardiac arrest, cause unspecified: Secondary | ICD-10-CM

## 2024-02-24 DIAGNOSIS — E162 Hypoglycemia, unspecified: Secondary | ICD-10-CM

## 2024-02-24 DIAGNOSIS — Z7189 Other specified counseling: Secondary | ICD-10-CM | POA: Diagnosis not present

## 2024-02-24 DIAGNOSIS — D649 Anemia, unspecified: Secondary | ICD-10-CM

## 2024-02-24 DIAGNOSIS — J9602 Acute respiratory failure with hypercapnia: Secondary | ICD-10-CM | POA: Diagnosis not present

## 2024-02-24 DIAGNOSIS — J9621 Acute and chronic respiratory failure with hypoxia: Secondary | ICD-10-CM | POA: Diagnosis not present

## 2024-02-24 DIAGNOSIS — J9622 Acute and chronic respiratory failure with hypercapnia: Secondary | ICD-10-CM

## 2024-02-24 LAB — BASIC METABOLIC PANEL WITH GFR
Anion gap: 5 (ref 5–15)
Anion gap: 8 (ref 5–15)
BUN: 22 mg/dL (ref 8–23)
BUN: 17 mg/dL (ref 8–23)
CO2: 33 mmol/L — ABNORMAL HIGH (ref 22–32)
CO2: 32 mmol/L (ref 22–32)
Calcium: 9 mg/dL (ref 8.9–10.3)
Calcium: 8.5 mg/dL — ABNORMAL LOW (ref 8.9–10.3)
Chloride: 100 mmol/L (ref 98–111)
Chloride: 101 mmol/L (ref 98–111)
Creatinine, Ser: 0.71 mg/dL (ref 0.61–1.24)
Creatinine, Ser: 0.52 mg/dL — ABNORMAL LOW (ref 0.61–1.24)
GFR, Estimated: >60 mL/min (ref >60)
GFR, Estimated: >60 mL/min (ref >60)
Glucose, Bld: 129 mg/dL — ABNORMAL HIGH (ref 70–99)
Glucose, Bld: 131 mg/dL — ABNORMAL HIGH (ref 70–99)
Potassium: 3.7 mmol/L (ref 3.5–5.1)
Potassium: 3.8 mmol/L (ref 3.5–5.1)
Sodium: 138 mmol/L (ref 135–145)
Sodium: 141 mmol/L (ref 135–145)

## 2024-02-24 LAB — GLUCOSE, CAPILLARY
Glucose-Capillary: 90 mg/dL (ref 70–99)
Glucose-Capillary: 101 mg/dL — ABNORMAL HIGH (ref 70–99)
Glucose-Capillary: 113 mg/dL — ABNORMAL HIGH (ref 70–99)
Glucose-Capillary: 127 mg/dL — ABNORMAL HIGH (ref 70–99)
Glucose-Capillary: 119 mg/dL — ABNORMAL HIGH (ref 70–99)
Glucose-Capillary: 82 mg/dL (ref 70–99)
Glucose-Capillary: 159 mg/dL — ABNORMAL HIGH (ref 70–99)

## 2024-02-24 LAB — PHOSPHORUS
Phosphorus: 3.2 mg/dL (ref 2.5–4.6)
Phosphorus: 3.5 mg/dL (ref 2.5–4.6)

## 2024-02-24 LAB — CBC
HCT: 31 % — ABNORMAL LOW (ref 39.0–52.0)
Hemoglobin: 9.8 g/dL — ABNORMAL LOW (ref 13.0–17.0)
MCH: 31.8 pg (ref 26.0–34.0)
MCHC: 31.6 g/dL (ref 30.0–36.0)
MCV: 100.6 fL — ABNORMAL HIGH (ref 80.0–100.0)
Platelets: 100 K/uL — ABNORMAL LOW (ref 150–400)
RBC: 3.08 MIL/uL — ABNORMAL LOW (ref 4.22–5.81)
RDW: 12.9 % (ref 11.5–15.5)
WBC: 15.3 K/uL — ABNORMAL HIGH (ref 4.0–10.5)
nRBC: 0 % (ref 0.0–0.2)

## 2024-02-24 LAB — RESPIRATORY PANEL BY PCR

## 2024-02-24 LAB — POCT I-STAT 7, (LYTES, BLD GAS, ICA,H+H)
Acid-Base Excess: 9 mmol/L — ABNORMAL HIGH (ref 0.0–2.0)
Bicarbonate: 34.9 mmol/L — ABNORMAL HIGH (ref 20.0–28.0)
Calcium, Ion: 1.19 mmol/L (ref 1.15–1.40)
HCT: 29 % — ABNORMAL LOW (ref 39.0–52.0)
Hemoglobin: 9.9 g/dL — ABNORMAL LOW (ref 13.0–17.0)
O2 Saturation: 97 %
Patient temperature: 37.4
Potassium: 3.9 mmol/L (ref 3.5–5.1)
Sodium: 138 mmol/L (ref 135–145)
TCO2: 37 mmol/L — ABNORMAL HIGH (ref 22–32)
pCO2 arterial: 55.7 mmHg — ABNORMAL HIGH (ref 32–48)
pH, Arterial: 7.406 (ref 7.35–7.45)
pO2, Arterial: 91 mmHg (ref 83–108)

## 2024-02-24 LAB — MAGNESIUM: Magnesium: 1.5 mg/dL — ABNORMAL LOW (ref 1.7–2.4)

## 2024-02-24 LAB — TROPONIN I (HIGH SENSITIVITY)
Troponin I (High Sensitivity): 102 ng/L (ref <18)
Troponin I (High Sensitivity): 194 ng/L (ref <18)

## 2024-02-24 LAB — SARS CORONAVIRUS 2 BY RT PCR: SARS Coronavirus 2 by RT PCR: NEGATIVE

## 2024-02-24 LAB — LACTIC ACID, PLASMA: Lactic Acid, Venous: 2.2 mmol/L (ref 0.5–1.9)

## 2024-02-24 MED ORDER — REVEFENACIN 175 MCG/3ML IN SOLN
175.0000 ug | Freq: Every day | RESPIRATORY_TRACT | Status: DC
  Administered 2024-02-24 – 2024-03-04 (×10): 175 ug via RESPIRATORY_TRACT
  Filled 2024-02-24 (×10): qty 3

## 2024-02-24 MED ORDER — DEXMEDETOMIDINE HCL IN NACL 400 MCG/100ML IV SOLN
0.0000 ug/kg/h | INTRAVENOUS | Status: DC
  Administered 2024-02-24: 0.4 ug/kg/h via INTRAVENOUS
  Administered 2024-02-24: 0.5 ug/kg/h via INTRAVENOUS
  Administered 2024-02-24: 0.9 ug/kg/h via INTRAVENOUS
  Administered 2024-02-25: 0.3 ug/kg/h via INTRAVENOUS
  Administered 2024-02-25: 1 ug/kg/h via INTRAVENOUS
  Filled 2024-02-24 (×3): qty 100

## 2024-02-24 MED ORDER — VITAL AF 1.2 CAL PO LIQD
1000.0000 mL | ORAL | Status: DC
  Administered 2024-02-24 – 2024-02-27 (×2): 1000 mL

## 2024-02-24 MED ORDER — MAGNESIUM SULFATE 2 GM/50ML IV SOLN
2.0000 g | Freq: Once | INTRAVENOUS | Status: AC
  Administered 2024-02-24: 2 g via INTRAVENOUS
  Filled 2024-02-24: qty 50

## 2024-02-24 MED ORDER — THIAMINE MONONITRATE 100 MG PO TABS
100.0000 mg | ORAL_TABLET | Freq: Every day | ORAL | Status: DC
  Administered 2024-02-24 – 2024-02-26 (×3): 100 mg
  Filled 2024-02-24 (×3): qty 1

## 2024-02-24 MED ORDER — DEXTROSE 10 % IV SOLN: INTRAVENOUS | Status: DC

## 2024-02-24 MED ORDER — DEXTROSE 50 % IV SOLN
INTRAVENOUS | Status: AC
  Administered 2024-02-24: 12.5 g via INTRAVENOUS
  Filled 2024-02-24: qty 50

## 2024-02-24 MED ORDER — IPRATROPIUM-ALBUTEROL 0.5-2.5 (3) MG/3ML IN SOLN: 3.0000 mL | RESPIRATORY_TRACT | Status: DC | PRN

## 2024-02-24 MED ORDER — ARFORMOTEROL TARTRATE 15 MCG/2ML IN NEBU
15.0000 ug | INHALATION_SOLUTION | Freq: Two times a day (BID) | RESPIRATORY_TRACT | Status: DC
  Administered 2024-02-24 – 2024-03-04 (×19): 15 ug via RESPIRATORY_TRACT
  Filled 2024-02-24 (×18): qty 2

## 2024-02-24 MED ORDER — LACTATED RINGERS IV BOLUS
500.0000 mL | Freq: Once | INTRAVENOUS | Status: AC
  Administered 2024-02-24: 500 mL via INTRAVENOUS

## 2024-02-24 MED ORDER — POTASSIUM CHLORIDE CRYS ER 20 MEQ PO TBCR: 40.0000 meq | EXTENDED_RELEASE_TABLET | Freq: Once | ORAL | Status: DC

## 2024-02-24 MED ORDER — POTASSIUM CHLORIDE 20 MEQ PO PACK
40.0000 meq | PACK | Freq: Once | ORAL | Status: AC
  Administered 2024-02-24: 40 meq
  Filled 2024-02-24: qty 2

## 2024-02-24 NOTE — Progress Notes (Signed)
 NAME:  Anthony Nelson, MRN:  [id number], DOB:  1961/03/05, LOS: 2 ADMISSION DATE:  02/22/2024, CONSULTATION DATE: 02/22/2024 REFERRING MD:  Cottie Cough , CHIEF COMPLAINT: Altered mental status  History of Present Illness:  63 year old male with chronic hypoxic/hypercapnic respiratory failure on home oxygen  due to severe COPD was brought into the emergency department after he was found unresponsive by home health aide.  Per patient's family he was in his usual state of health until about 3 weeks ago when he started declining having increasing lethargy, generalized weakness, this morning home health aide found him unresponsive, not able to speak, EMS was summoned and he was brought to the emergency department in the ED patient was intubated due as he was aspirating.    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   Respiratory failure with hypoxia (HCC)     Significant Hospital Events: Including procedures, antibiotic start and stop dates in addition to other pertinent events   8/23 admitted with acute on chronic respiratory failure/went into PEA cardiac arrest, ROSC achieved after 9 minutes 8/24 Started spiking fever with Tmax 102, remained on vasopressor support with Levophed  and vasopressin , Levophed  requirement is improving, currently on 20 mics of Levophed  and 0.04 vasopressin  FiO2 titrated down to 40% 8/25 Agitation overnight    Interim History / Subjective:  Agitation overnight  Increased Propofol  gtt, haldol  given   Objective    Blood pressure 113/68, pulse 70, temperature 98.1 F (36.7 C), resp. rate 18, height 5' 9 (1.753 m), weight 50.3 kg, SpO2 100%. CVP:  [7 mmHg-13 mmHg] 10 mmHg  Vent Mode: PRVC FiO2 (%):  [40 %] 40 % Set Rate:  [18 bmp] 18 bmp Vt Set:  [440 mL] 440 mL PEEP:  [5 cmH20-8 cmH20] 5 cmH20 Plateau Pressure:  [12 cmH20-16 cmH20] 12 cmH20   Intake/Output Summary (Last 24 hours) at 02/24/2024 0719 Last data filed at 02/24/2024 9371 Gross per 24 hour  Intake 2879.3 ml  Output 1835 ml  Net 1044.3 ml   Filed Weights   02/22/24 1330 02/23/24 0600  Weight: 49.9 kg 50.3 kg    Examination: General: acute on chronic cachetic older adult male, lying in icu bed on vent  HEENT: Normocephalic, PERRLA intact, ETT, OG, Pink MM, Poor dentition  CV: s1,s2, RRR, no MRG, No JVD  pulm: clear, diminished, no distress on vent  Abs: bs active, soft  Extremities: no edema, no deformity, moves all extremities on command-intermittently  Skin: no rash  Neuro: Rass -1 to -2, responds to painful stimuli, cough gag reflex present, follows commands  GU: foley intact     Patient Lines/Drains/Airways Status     Active Line/Drains/Airways     Name Placement date Placement time Site Days   Arterial Line 02/22/24 Left Radial 02/22/24  --  Radial  1   Peripheral IV 02/22/24 22 G Left;Posterior Forearm 02/22/24  1131  Forearm  1   CVC Triple Lumen 02/22/24 Left Internal  jugular 02/22/24  1530  -- 1   NG/OG Vented/Dual Lumen Oral 02/22/24  --  Oral  1   Urethral Catheter Elsie Council RN Temperature probe 16 Fr. 02/23/24  0115  Temperature probe  less than 1   Airway 7.5 mm 02/22/24  1328  -- 1        Resolved problem list   Assessment and Plan  Status post PEA cardiac arrest in the setting of hypoxia Patient went into PEA cardiac arrest, ROSC achieved after 9 minutes of CPR P: Continue Cardiac  Telemetry  Continue continuous pulse ox  Continue treatment below for AECOPD, and Respiratory Failure  Patient following commands, plan to wean to attempt to extubate later today on 8/25   Acute on chronic hypoxic/hypercapnic respiratory failure Acute severe COPD exacerbation Severe sepsis/septic shock due to bilateral multifocal pneumonia, POA 8/25- NGTD on BC, tracheal aspirate pending  P:  Continue ventilator support and lung protective strategies  Continue LTVV  Wean PEEP and Fio2 requirements to sat goal of >92%  HOB > 30 degrees Plat < 30  Aim for Driving pressures < 15  Intermittent Chest X-ray and ABGS follow cultures-blood and tracheal aspirate VAP and PAD protocols in place  Wean sedation as tolerated, SBT and WUA daily  - DC propofol  gtt, transition to precedex  gtt  Continue to wean off levophed  gtt as tolerated  Continue scheduled duonebs for now and IV steroids  Continue ceftriaxone  and azithromycin   Plan to extubate if patient tolerates wean and after cortrak placements  Lactic 2.2, CVP 5- will give 500 cc bolus of LR  Check flu, RSV, and Covid, RVP panel   Acute septic encephalopathy Mixed metabolic disorder including metabolic alkalosis/metabolic  acidosis/with compensating respiratory acidosis - Mental status appears to be improving  P: Continue to treat sepsis/respiratory failure as above, and metabolic derangements Limit sedating medications DC propofol , add precedex  gttt  Initiate Delirium precautions   Cachexia/severe protein calorie malnutrition Hypoglycemia  P: On D10, continue for now, hold insulin  SSI  Place order for cortrak  RD consult  Once start tube feeds discontinue D10   Hypokalemia Hypomagnesemia  P: Giving 2 grams of magnesium  now Continue to trend electrolytes on daily BMETs   Anemia and thrombocytopenia of critical illness Hgb 9.8 from 11, no signs of bleeding  P: Transfuse for hgb less than 7 Continue to monitor for signs of  bleeding    Labs   CBC: Recent Labs  Lab 02/22/24 1145 02/22/24 1552 02/22/24 1606 02/22/24 2023 02/23/24 0349 02/23/24 1554 02/24/24 0627  WBC 9.7  --  9.9  --  11.4*  --  15.3*  NEUTROABS 8.9*  --   --   --   --   --   --   HGB 13.7   < > 12.0* 11.9* 11.0*  11.9* 9.9* 9.8*  HCT 46.8   < > 40.7 35.0* 34.8*  35.0* 29.0* 31.0*  MCV 109.1*  --  109.7*  --  101.2*  --  100.6*  PLT 169  --  130*  --  124*  --  100*   < > = values in this interval not displayed.    Basic Metabolic Panel: Recent Labs  Lab 02/22/24 1145 02/22/24 1552 02/22/24 1606 02/22/24 2023 02/23/24 0349 02/23/24 1519 02/23/24 1554  NA 143   < > 150* 143 142  142 137 138  K 4.6   < > 3.8 2.8* 2.8*  2.8* 4.1 3.9  CL 83*  --  88*  --  90* 94*  --   CO2 >45*  --  37*  --  38* 33*  --   GLUCOSE 125*  --  183*  --  133* 133*  --   BUN 32*  --  38*  --  39* 35*  --   CREATININE 0.67  --  1.12  --  0.90 0.70  --   CALCIUM  9.5  --  13.1*  --  9.9 8.7*  --   MG  --   --   --   --  1.8  --   --   PHOS  --   --   --   --  2.5  --   --    < > = values in this interval not displayed.   GFR: Estimated Creatinine Clearance: 66.4 mL/min (by C-G formula based on SCr of 0.7 mg/dL). Recent Labs  Lab 02/22/24 1145 02/22/24 1209 02/22/24 1606 02/22/24 1607 02/23/24 0349 02/23/24 0815 02/24/24 0627  WBC 9.7  --  9.9  --  11.4*  --  15.3*  LATICACIDVEN  --  1.0  --  15.0*  --  2.9*  --     Liver Function Tests: Recent Labs  Lab 02/22/24 1145 02/23/24 0803  AST 30 215*  ALT 19 292*  ALKPHOS 59 53  BILITOT 0.7 0.7  PROT 7.7 5.8*  ALBUMIN 4.1 3.0*   No results for input(s): LIPASE, AMYLASE in the last 168 hours. Recent Labs  Lab 02/22/24 1146  AMMONIA 47*    ABG    Component Value Date/Time   PHART 7.406 02/23/2024 1554   PCO2ART 55.7 (H) 02/23/2024 1554   PO2ART 91 02/23/2024 1554   HCO3 34.9 (H) 02/23/2024 1554   TCO2 37 (H) 02/23/2024 1554   O2SAT 97 02/23/2024 1554      Coagulation Profile: Recent Labs  Lab 02/22/24 1145  INR 0.9    Cardiac Enzymes: No results for input(s): CKTOTAL, CKMB, CKMBINDEX, TROPONINI in the last 168 hours.  HbA1C: Hgb A1c MFr Bld  Date/Time Value Ref Range Status  07/08/2023 02:34 AM 5.6 4.8 - 5.6 % Final    Comment:    (NOTE)         Prediabetes: 5.7 - 6.4         Diabetes: >6.4         Glycemic control for adults with diabetes: <7.0   03/08/2020 07:00 PM 5.9 (H) 4.8 - 5.6 % Final    Comment:    (NOTE) Pre diabetes:          5.7%-6.4%  Diabetes:              >6.4%  Glycemic control for   <7.0% adults with diabetes     CBG: Recent Labs  Lab 02/23/24 2324 02/23/24 2326 02/24/24 0106 02/24/24 0148 02/24/24 0329  GLUCAP 54* 57* 90 101* 113*    Critical care time:  60 mins     Christian Aleida Crandell AGACNP-BC   [facility] 02/24/2024, 7:25 AM  Please see [email or web address] for pager details.  From 7A-7P if no response, please call [phone]. After hours, please call ELink [phone].

## 2024-02-24 NOTE — Progress Notes (Signed)
 12-lead EKG performed. Critical value noted.  Bari, RN, notified.

## 2024-02-24 NOTE — Progress Notes (Signed)
 EKG earlier for ST elevations.  EKG with NSR, very tall peaked TW in inferior leads, ST segments upsloping minimal J point elevation.  Last K+ 3.7, repletion was given.   Plan: Stat repeat BMP  Leita SHAUNNA Gaskins, DO 02/24/24 3:52 PM [facility]  For contact information, see Amion. If no response to pager, please call PCCM consult pager. After hours, 7PM- 7AM, please call Elink.

## 2024-02-24 NOTE — Progress Notes (Signed)
 Initial Nutrition Assessment  DOCUMENTATION CODES:   Underweight  INTERVENTION:   Tube Feeding via Cortrak:  Vital AF 1.2 at 55 ml/hr Begin TF at rate of 25 ml/hr, titrate by 10 ml/hr q 12 hours until goal rate of 55 ml/hr  TF at goal rate provides 1584 kcals, 99 g of protein and 1069 mL of free water   Monitor magnesium , potassium, and phosphorus daily for at least 3 days, MD to replete as needed, as pt is at risk for refeeding syndrome   Add Thiamine  100 mg x 7 days   NUTRITION DIAGNOSIS:   Inadequate oral intake related to acute illness, catabolic illness as evidenced by NPO status, per patient/family report.  GOAL:   Patient will meet greater than or equal to 90% of their needs  MONITOR:   Vent status, Labs, Weight trends, TF tolerance, Skin  REASON FOR ASSESSMENT:   Consult Enteral/tube feeding initiation and management  ASSESSMENT:   63 yo male admitted with acute on chronic respiratory failure after being found down at home, pt intubated upon arrival to ED, admitted to ICU and then had witnessed PEA arrest. PMH includes severe COPD, chronic respiratory failure on home oxygen , malnutrition, HF.  8/23 Admitted, Intubated, +witness in-hospital PEA arrest  Pt currently sedated on vent support Cortrak placed today  Noted GOC discussions ongoing; pt currently DNR. Plan to continue current care  and optimize patient prior to extubation. If pt does not tolerate extubation, plan for comfort care at that time.   Noted pt with presence of malnutrition per MD notes  Per report, pt with progressively worsening lethargy and weakness over the past several weeks.   Labs: Phosphorus 3.2 (wdl) Magnesium  1.5 (L) Sodium 138 (wdl) Potassium 3.7 (wdl) BUN/Creatinine wdl CBGs 90-127   Meds:  D10 at 15 ml/hr Pepcid  SS novolog  Solumedrol  NUTRITION - FOCUSED PHYSICAL EXAM: Unable to assess  Diet Order:   Diet Order             Diet NPO time specified  Diet  effective now                   EDUCATION NEEDS:   Not appropriate for education at this time  Skin:  Skin Assessment: Skin Integrity Issues: Skin Integrity Issues:: Stage II Stage II: mid sacrum  Last BM:  8/25  Height:   Ht Readings from Last 1 Encounters:  02/23/24 5' 9 (1.753 m)    Weight:   Wt Readings from Last 1 Encounters:  02/24/24 51.7 kg   BMI:  Body mass index is 16.83 kg/m.  Estimated Nutritional Needs:   Kcal:  1500-1700 kcals  Protein:  75-90 g  Fluid:  >/= 1.7 L  Betsey Finger MS, RDN, LDN, CNSC Registered Dietitian 3 Clinical Nutrition RD Inpatient Contact Info in Amion

## 2024-02-24 NOTE — Procedures (Signed)
 Cortrak  Person Inserting Tube:  Mady Dolly, RD Tube Type:  Cortrak - 43 inches Tube Size:  10 Tube Location:  Left nare Secured by: Bridle Initial Placement:  Gastric Technique Used to Measure Tube Placement:  Marking at nare/corner of mouth Cortrak Secured At:  80 cm   Cortrak Tube Team Note:  Consult received to place a Cortrak feeding tube.   No x-ray is required. RN may begin using tube.   If the tube becomes dislodged please keep the tube and contact the Cortrak team at [email or web address] for replacement.  If after hours and replacement cannot be delayed, place a NG tube and confirm placement with an abdominal x-ray.    Dolly Mady MS, RD, LDN Registered Dietitian Clinical Nutrition RD Inpatient Contact Info in Amion

## 2024-02-24 NOTE — Progress Notes (Signed)
  Interdisciplinary Goals of Care Family Meeting   Date carried out:: 02/24/2024  Location of the meeting: Conference room  Member's involved: Nurse Practitioner and Bedside Registered Nurse Family: Sister, Ex-Daughter in Social worker, Son- Printmaker (Via phone)   Durable Power of Pensions consultant or acting medical decision maker: Son- Levar- via phone   Discussion:   We discussed goals of care for [employer]. Discussed patient's current critical care status and goals of care/plan of care moving forward. Per Son and Family, patient would not want to continue mechanical ventilation long-term. Patient currently weaning at this time and following commands with precedex  to help assist with agitation.  Also, discussed with son and family that patient has severe COPD, and malnourished at baseline. Therefore, patient has high risk of decompensation. Goal would be to optimize patient to extubate successfully. Son and family okay with plan to try to optimize and give patient a shot in extubation. Son and family also in agreement with, if patient fails extubation despite utilization of BIPAP, and optimization will transition to comfort.   Code status: Full DNR  Disposition: Continue current acute care Place cortak  One way extubation to BIPAP.  if patient fails will transition to comfort  Patient may not tolerate BIPAP, will attempt, can continue to use precedex  gtt  Order Palliative care consult    Time spent for the meeting: 30 mins   Christian Gweneth Fredlund AGACNP-BC   [facility] 02/24/2024, 12:58 PM  Please see [email or web address] for pager details.  From 7A-7P if no response, please call [phone]. After hours, please call ELink [phone].

## 2024-02-24 NOTE — Progress Notes (Signed)
 Patient placed on vent wean 5/5 and is tolerating well at this time. RNs notified.

## 2024-02-24 NOTE — Plan of Care (Signed)
  Problem: Clinical Measurements: Goal: Diagnostic test results will improve Outcome: Progressing Goal: Respiratory complications will improve Outcome: Progressing Goal: Cardiovascular complication will be avoided Outcome: Progressing   Problem: Elimination: Goal: Will not experience complications related to bowel motility Outcome: Progressing Goal: Will not experience complications related to urinary retention Outcome: Progressing   Problem: Skin Integrity: Goal: Risk for impaired skin integrity will decrease Outcome: Progressing

## 2024-02-24 NOTE — Procedures (Signed)
 Extubation Procedure Note  Patient Details:   Name: Anthony Nelson DOB: 1961-06-11 MRN: [id number]   Airway Documentation:    Vent end date: (not recorded) Vent end time: (not recorded)   Evaluation  O2 sats: stable throughout Complications: No apparent complications Patient did not tolerate procedure well. Bilateral Breath Sounds: Diminished   No  [name] 02/24/2024, 3:30 PM Pt extubated to bipap at this time with no complications. Pt did not speak after extubation.

## 2024-02-24 NOTE — TOC Initial Note (Signed)
 Transition of Care [facility]) - Initial/Assessment Note    Patient Details  Name: Anthony Nelson MRN: [id number] Date of Birth: [birth year]  [facility] CM/SW Contact:    Ulus Hazen E Yitzchok Carriger, LCSW Phone Number: 02/24/2024, 8:56 AM  Clinical Narrative:                 Patient was admitted for respiratory failure. Patient is currently intubated.  Per chart review, patient is from home alone. Has family support. Patient has PCS services 29 hours per week.  CSW spoke with [place] with [facility] who also confirms they follow patient for [facility] services.  ICM will follow for needs during this admission.    Barriers to Discharge: Continued Medical Work up   Patient Goals and CMS Choice            Expected Discharge Plan and Services       Living arrangements for the past 2 months: Single Family Home                                      Prior Living Arrangements/Services Living arrangements for the past 2 months: Single Family Home Lives with:: Self Patient language and need for interpreter reviewed:: Yes        Need for Family Participation in Patient Care: Yes (Comment) Care giver support system in place?: Yes (comment) Current home services: DME, Homehealth aide Criminal Activity/Legal Involvement Pertinent to Current Situation/Hospitalization: No - Comment as needed  Activities of Daily Living   ADL Screening (condition at time of admission) Independently performs ADLs?: Yes (appropriate for developmental age) Is the patient deaf or have difficulty hearing?: Yes Does the patient have difficulty seeing, even when wearing glasses/contacts?: No Does the patient have difficulty concentrating, remembering, or making decisions?: No  Permission Sought/Granted                  Emotional Assessment       Orientation: :  (intubated) Alcohol / Substance Use: Not Applicable Psych Involvement: No (comment)  Admission diagnosis:  Acute and chronic  respiratory failure (acute-on-chronic) (HCC) [J96.20] Acute respiratory failure with hypercapnia (HCC) [J96.02] Altered mental status, unspecified altered mental status type [R41.82] Patient Active Problem List   Diagnosis Date Noted   Acute and chronic respiratory failure (acute-on-chronic) (HCC) 02/22/2024   Respiratory failure with hypoxia (HCC) 07/07/2023   COPD with acute exacerbation (HCC) 04/28/2023   Protein-calorie malnutrition, severe 04/11/2023   COPD exacerbation (HCC) 04/09/2023   Chronic right-sided congestive heart failure (HCC) 01/16/2023   Homeless 01/16/2023   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   Gastroesophageal reflux disease 01/29/2022   Hyperlipidemia 10/04/2020   Macrocytosis 09/03/2020   Atherosclerosis of aorta (HCC) 04/06/2020   Weight loss 08/11/2019   Chronic diastolic heart failure (HCC) 04/25/2019   Chronic respiratory failure with hypoxia and hypercapnia (HCC) 04/25/2019   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 [place] [address] Phone: [phone] Fax: [phone]  [facility] - [facility] [address] [place] [address] Phone: [phone] Fax: [phone]  [facility] [address] [place] [address] Phone:  [phone] Fax: [phone]  [id number] [place], [place] - [address] AT [facility] OF [address] [place] [address] Phone: [phone] Fax: [phone]     Social Drivers of Health (SDOH) Social History: SDOH Screenings   Food Insecurity: Patient Unable To Answer (02/23/2024)  Housing: Unknown (02/23/2024)   Transportation Needs: Patient Unable To Answer (02/23/2024)  Recent Concern: Transportation Needs - Unmet Transportation Needs (01/13/2024)  Utilities: Patient Unable To Answer (02/23/2024)  Alcohol Screen: Low Risk  (12/26/2022)  Depression (PHQ2-9): Low Risk  (01/16/2023)  Physical Activity: Inactive (07/17/2023)  Social Connections: Moderately Isolated (01/13/2024)  Stress: No Stress Concern Present (12/26/2022)  Tobacco Use: Medium Risk (02/22/2024)   SDOH Interventions: Food Insecurity Interventions: Patient Unable to Answer Housing Interventions: Patient Unable to Answer Transportation Interventions: Patient Unable to Answer Utilities Interventions: Patient Unable to Answer   Readmission Risk Interventions    07/12/2023    3:16 PM  Readmission Risk Prevention Plan  Transportation Screening Complete  Medication Review (RN Care Manager) Referral to Pharmacy  PCP or Specialist appointment within 3-5 days of discharge Complete  SW Recovery Care/Counseling Consult Complete  Palliative Care Screening Not Applicable  Skilled Nursing Facility Not Applicable

## 2024-02-24 NOTE — Consult Note (Signed)
 Consultation Note Date: 02/24/2024   Patient Name: Anthony Nelson  DOB: 1961-01-09  MRN: [id number]  Age / Sex: 63 y.o., male  PCP: [facility] Referring Physician: Gretta Leita SQUIBB, DO  Reason for Consultation: Establishing goals of care  HPI/Patient Profile: 63 y.o. male  with past medical history of chronic hypoxic/hypercapnic respiratory failure on home oxygen  due to severe COPD admitted on 02/22/2024 with unresponsiveness found by home health aide, weeks of increased lethargy, generalized weakness.   In the ED, patient was intubated and had a witnessed PEA cardiac arrest with CPR initiated and ROSC in 9 minutes.  He was admitted to the ICU for acute COPD exacerbation, acute on chronic respiratory failure, severe sepsis/septic shock due to bilateral multifocal pneumonia requiring vasopressor support.  PMT has been consulted to assist with goals of care conversation.  Clinical Assessment and Goals of Care:  I have reviewed medical records including EPIC notes, labs and imaging, discussed with RN and primary team, assessed the patient and then met at the bedside with patient's ex daughter-in-law and sister to discuss diagnosis prognosis, GOC, EOL wishes, disposition and options.  I introduced Palliative Medicine as specialized medical care for people living with serious illness. It focuses on providing relief from the symptoms and stress of a serious illness. The goal is to improve quality of life for both the patient and the family.  We discussed a brief life review of the patient and then focused on their current illness.  The natural disease trajectory and expectations at EOL were discussed.  I attempted to elicit values and goals of care important to the patient.    Medical History Review and Understanding:  We discussed patient's acute illness in the context of their chronic comorbidities.  Patient's family understand the severity of patient's  illness.  Social History: Patient has a son and a daughter.  He has a sister who shares his adoptive parents, currently visiting from [place] and returning soon.  He also has biological siblings.  He has a grandson who he endorses.  He enjoys Astronomer, eating good food, companionship.  He lives at home in his own apartment and greatly values his independence.  He is a spiritual person Hennie).  Functional and Nutritional State: Prior to admission, patient's family had concerns about his ability to continue living and functioning in his own apartment, though he was adamant that this was very important to him.  Albumin of 3.0 on 8/24.  Advance Directives: A detailed discussion regarding advanced directives was had.  No documentation on file.  Family reports that they thought this was recently done, however it was not actually notarized.  They are open to completion during this admission if patient is able.  Code Status: Concepts specific to code status, artifical feeding and hydration, and rehospitalization were considered and discussed.   Discussion: Patient's ex daughter-in-law shares that she has experience with palliative care in the past through her work and home health.  They are appreciative of the support and curious about what outpatient palliative care support would look like.  They understand the possibility of patient needing hospice if he does not improve.  Family confirms that after one-way extubation, they would transition to comfort if he suffers excessively.  They agree it would be very helpful to have continued discussions about what his care would look like if he improves after extubation.  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  They are hopeful that he will be able to participate in goals of care discussions moving forward.  Emotional support therapeutic listening was  provided.   The difference between aggressive medical intervention and comfort care was considered in light of the patient's goals of care. Hospice and Palliative Care services outpatient were explained and offered.   Discussed the importance of continued conversation with family and the medical providers regarding overall plan of care and treatment options, ensuring decisions are within the context of the patient's values and GOCs.   Questions and concerns were addressed.  Hard Choices booklet left for review. The family was encouraged to call with questions or concerns.  PMT will continue to support holistically.   SUMMARY OF RECOMMENDATIONS   - Continue DNR/DNI - Patient's family confirmed plan is for one-way extubation with transition to comfort if he fails - Ongoing goals of care discussions pending clinical course - Psychosocial and emotional support provided - PMT will continue to follow and support  Prognosis:  Guarded to poor with high risk for decline  Discharge Planning: To Be Determined      Primary Diagnoses: Present on Admission:  Acute and chronic respiratory failure (acute-on-chronic) (HCC)    Physical Exam Vitals and nursing note reviewed.  Constitutional:      General: He is not in acute distress.    Appearance: He is ill-appearing.     Interventions: He is intubated.  HENT:     Head: Normocephalic and atraumatic.  Cardiovascular:     Rate and Rhythm: Normal rate.  Pulmonary:     Effort: He is intubated.  Skin:    General: Skin is warm and dry.     Vital Signs: BP 117/82   Pulse 60   Temp (!) 96.6 F (35.9 C)   Resp 15   Ht 5' 9 (1.753 m)   Wt 51.7 kg   SpO2 99%   BMI 16.83 kg/m  Pain Scale: CPOT       SpO2: SpO2: 99 % O2 Device:SpO2: 99 % O2 Flow Rate: .     MDM: High   Fardowsa Authier SHAUNNA Fell, PA-C  Palliative Medicine Team Team phone # [phone]  Thank you for allowing the Palliative Medicine Team to assist in the care of  this patient. Please utilize secure chat with additional questions, if there is no response within 30 minutes please call the above phone number.  Palliative Medicine Team providers are available by phone from 7am to 7pm daily and can be reached through the team cell phone.  Should this patient require assistance outside of these hours, please call the patient's attending physician.

## 2024-02-25 DIAGNOSIS — J9602 Acute respiratory failure with hypercapnia: Secondary | ICD-10-CM | POA: Diagnosis not present

## 2024-02-25 DIAGNOSIS — I469 Cardiac arrest, cause unspecified: Secondary | ICD-10-CM | POA: Diagnosis not present

## 2024-02-25 DIAGNOSIS — J9621 Acute and chronic respiratory failure with hypoxia: Secondary | ICD-10-CM | POA: Diagnosis not present

## 2024-02-25 DIAGNOSIS — E878 Other disorders of electrolyte and fluid balance, not elsewhere classified: Secondary | ICD-10-CM

## 2024-02-25 DIAGNOSIS — E43 Unspecified severe protein-calorie malnutrition: Secondary | ICD-10-CM

## 2024-02-25 DIAGNOSIS — G9340 Encephalopathy, unspecified: Secondary | ICD-10-CM

## 2024-02-25 DIAGNOSIS — J441 Chronic obstructive pulmonary disease with (acute) exacerbation: Secondary | ICD-10-CM | POA: Diagnosis not present

## 2024-02-25 DIAGNOSIS — Z7189 Other specified counseling: Secondary | ICD-10-CM | POA: Diagnosis not present

## 2024-02-25 DIAGNOSIS — Z515 Encounter for palliative care: Secondary | ICD-10-CM | POA: Diagnosis not present

## 2024-02-25 DIAGNOSIS — J9622 Acute and chronic respiratory failure with hypercapnia: Secondary | ICD-10-CM | POA: Diagnosis not present

## 2024-02-25 LAB — POCT I-STAT 7, (LYTES, BLD GAS, ICA,H+H)
Acid-Base Excess: 10 mmol/L — ABNORMAL HIGH (ref 0.0–2.0)
Bicarbonate: 38.2 mmol/L — ABNORMAL HIGH (ref 20.0–28.0)
Calcium, Ion: 1.24 mmol/L (ref 1.15–1.40)
HCT: 29 % — ABNORMAL LOW (ref 39.0–52.0)
Hemoglobin: 9.9 g/dL — ABNORMAL LOW (ref 13.0–17.0)
O2 Saturation: 73 %
Patient temperature: 37
Potassium: 3.8 mmol/L (ref 3.5–5.1)
Sodium: 138 mmol/L (ref 135–145)
TCO2: 40 mmol/L — ABNORMAL HIGH (ref 22–32)
pCO2 arterial: 70.6 mmHg (ref 32–48)
pH, Arterial: 7.341 — ABNORMAL LOW (ref 7.35–7.45)
pO2, Arterial: 43 mmHg — ABNORMAL LOW (ref 83–108)

## 2024-02-25 LAB — GLUCOSE, CAPILLARY
Glucose-Capillary: 116 mg/dL — ABNORMAL HIGH (ref 70–99)
Glucose-Capillary: 123 mg/dL — ABNORMAL HIGH (ref 70–99)
Glucose-Capillary: 113 mg/dL — ABNORMAL HIGH (ref 70–99)
Glucose-Capillary: 88 mg/dL (ref 70–99)
Glucose-Capillary: 91 mg/dL (ref 70–99)
Glucose-Capillary: 82 mg/dL (ref 70–99)
Glucose-Capillary: 82 mg/dL (ref 70–99)

## 2024-02-25 LAB — BASIC METABOLIC PANEL WITH GFR
Anion gap: 10 (ref 5–15)
BUN: 16 mg/dL (ref 8–23)
CO2: 32 mmol/L (ref 22–32)
Calcium: 8.6 mg/dL — ABNORMAL LOW (ref 8.9–10.3)
Chloride: 96 mmol/L — ABNORMAL LOW (ref 98–111)
Creatinine, Ser: 0.49 mg/dL — ABNORMAL LOW (ref 0.61–1.24)
GFR, Estimated: >60 mL/min (ref >60)
Glucose, Bld: 126 mg/dL — ABNORMAL HIGH (ref 70–99)
Potassium: 4.1 mmol/L (ref 3.5–5.1)
Sodium: 138 mmol/L (ref 135–145)

## 2024-02-25 LAB — CBC
HCT: 29 % — ABNORMAL LOW (ref 39.0–52.0)
Hemoglobin: 9.1 g/dL — ABNORMAL LOW (ref 13.0–17.0)
MCH: 31.4 pg (ref 26.0–34.0)
MCHC: 31.4 g/dL (ref 30.0–36.0)
MCV: 100 fL (ref 80.0–100.0)
Platelets: 96 K/uL — ABNORMAL LOW (ref 150–400)
RBC: 2.9 MIL/uL — ABNORMAL LOW (ref 4.22–5.81)
RDW: 12.8 % (ref 11.5–15.5)
WBC: 15.1 K/uL — ABNORMAL HIGH (ref 4.0–10.5)
nRBC: 0 % (ref 0.0–0.2)

## 2024-02-25 LAB — MAGNESIUM: Magnesium: 1.9 mg/dL (ref 1.7–2.4)

## 2024-02-25 LAB — TROPONIN I (HIGH SENSITIVITY): Troponin I (High Sensitivity): 87 ng/L — ABNORMAL HIGH (ref <18)

## 2024-02-25 MED ORDER — PREDNISONE 20 MG PO TABS
40.0000 mg | ORAL_TABLET | Freq: Every day | ORAL | Status: DC
  Administered 2024-02-26: 40 mg
  Filled 2024-02-25: qty 2

## 2024-02-25 MED ORDER — MIDODRINE HCL 5 MG PO TABS
10.0000 mg | ORAL_TABLET | Freq: Three times a day (TID) | ORAL | Status: DC
  Administered 2024-02-25 – 2024-03-01 (×15): 10 mg
  Filled 2024-02-25 (×15): qty 2

## 2024-02-25 MED ORDER — MAGNESIUM SULFATE 2 GM/50ML IV SOLN
2.0000 g | Freq: Once | INTRAVENOUS | Status: AC
  Administered 2024-02-25: 2 g via INTRAVENOUS
  Filled 2024-02-25: qty 50

## 2024-02-25 NOTE — Evaluation (Signed)
 Physical Therapy Evaluation Patient Details Name: Anthony Nelson MRN: [id number] DOB: Aug 05, 1960 Today's Date: 02/25/2024  History of Present Illness  63 year old male was brought into the emergency department 8/23 after he was found unresponsive by home health aide. Acute on chronic respiratory failure with hypoxia and hypercapnia due to COPD AE. PMH: chronic hypoxic/hypercapnic respiratory failure on home oxygen  due to severe COPD pulmonary hypertension, diastolic heart failure and severe protein calorie malnutrition.  Clinical Impression  Pt admitted with above diagnosis. Difficult to understand with bipap but oriented to self, location, and year, following simple step commands consistently today. Pt able to rise to EOB with min assist and stand with a few lateral steps along bed at a min assist level. SpO2 91-96% avg 94% on BiPAP throughout session at 40% FiO2 and PEEP 5. Easily fatigued. Will follow-acutely and update recs as he progresses. Pt currently with functional limitations due to the deficits listed below (see PT Problem List). Pt will benefit from acute skilled PT to increase their independence and safety with mobility to allow discharge.           If plan is discharge home, recommend the following: A lot of help with walking and/or transfers;A lot of help with bathing/dressing/bathroom;Assistance with cooking/housework;Assist for transportation;Help with stairs or ramp for entrance   Can travel by private vehicle        Equipment Recommendations None recommended by PT  Recommendations for Other Services       Functional Status Assessment Patient has had a recent decline in their functional status and demonstrates the ability to make significant improvements in function in a reasonable and predictable amount of time.     Precautions / Restrictions Precautions Precautions: Fall Recall of Precautions/Restrictions: Impaired Precaution/Restrictions Comments: monitor  O2 Restrictions Weight Bearing Restrictions Per Provider Order: No      Mobility  Bed Mobility Overal bed mobility: Needs Assistance Bed Mobility: Rolling, Sidelying to Sit, Sit to Sidelying Rolling: Contact guard assist Sidelying to sit: Min assist, HOB elevated     Sit to sidelying: Min assist General bed mobility comments: CGA to roll, managed leads/lines. Min assist for trunk support to rise. Cues for technique. Min assist for LEs back into bed.    Transfers Overall transfer level: Needs assistance Equipment used: 1 person hand held assist Transfers: Sit to/from Stand Sit to Stand: Min assist           General transfer comment: Min assist for boost to stand, cues for technique, face to face, posterior lean present. Fair power up but unsteady.    Ambulation/Gait             Pre-gait activities: Lateral steps along bed min assist for balance.    Stairs            Wheelchair Mobility     Tilt Bed    Modified Rankin (Stroke Patients Only)       Balance Overall balance assessment: Needs assistance Sitting-balance support: Single extremity supported Sitting balance-Leahy Scale: Poor Sitting balance - Comments: CGA   Standing balance support: Single extremity supported Standing balance-Leahy Scale: Poor                               Pertinent Vitals/Pain Pain Assessment Pain Assessment: CPOT Facial Expression: Relaxed, neutral Body Movements: Absence of movements Muscle Tension: Relaxed Compliance with ventilator (intubated pts.): N/A Vocalization (extubated pts.): N/A CPOT Total: 0    Home  Living Family/patient expects to be discharged to:: Private residence Living Arrangements: Spouse/significant other Available Help at Discharge: Family;Available PRN/intermittently Type of Home: Apartment Home Access: Level entry       Home Layout: One level Home Equipment: Grab bars - tub/shower;Rollator (4 wheels);Electric  scooter Additional Comments: home concentrator of O2 * Info from prior admisson (pt with bipap unable to understand responses.)    Prior Function Prior Level of Function : Independent/Modified Independent             Mobility Comments: *Info from prior admission last month: uses rollator for ambulation, reports no falls ADLs Comments: *Info from prior admission last month: reports independence, but im getting help with it now pt states an aide is being set up for him.3-4x per week. pt reports aide asissts with pillbox set-up and he is able to manage taking medications otherwise.     Extremity/Trunk Assessment   Upper Extremity Assessment Upper Extremity Assessment: Defer to OT evaluation    Lower Extremity Assessment Lower Extremity Assessment: Generalized weakness       Communication   Communication Communication: Impaired Factors Affecting Communication: Other (comment) (Bipap)    Cognition Arousal: Alert Behavior During Therapy: WFL for tasks assessed/performed   PT - Cognitive impairments: No family/caregiver present to determine baseline, Awareness                       PT - Cognition Comments: Oriented to self, location, year. Following commands: Impaired Following commands impaired: Follows one step commands with increased time, Only follows one step commands consistently     Cueing Cueing Techniques: Verbal cues, Gestural cues     General Comments General comments (skin integrity, edema, etc.): Spo2 94% on bipap  at 40% FiO2 PEEp 5. BP 94/55 (MAP 71)    Exercises Other Exercises Other Exercises: seated balance, upright posture, midline control tasks   Assessment/Plan    PT Assessment Patient needs continued PT services  PT Problem List Decreased strength;Decreased activity tolerance;Decreased balance;Decreased mobility;Decreased cognition;Decreased knowledge of use of DME;Decreased safety awareness;Cardiopulmonary status limiting  activity;Decreased knowledge of precautions       PT Treatment Interventions DME instruction;Gait training;Stair training;Functional mobility training;Therapeutic activities;Therapeutic exercise;Balance training;Neuromuscular re-education;Patient/family education;Cognitive remediation    PT Goals (Current goals can be found in the Care Plan section)  Acute Rehab PT Goals Patient Stated Goal: none stated PT Goal Formulation: With patient Time For Goal Achievement: 03/10/24 Potential to Achieve Goals: Fair    Frequency Min 2X/week     Co-evaluation               AM-PAC PT 6 Clicks Mobility  Outcome Measure Help needed turning from your back to your side while in a flat bed without using bedrails?: A Little Help needed moving from lying on your back to sitting on the side of a flat bed without using bedrails?: A Little Help needed moving to and from a bed to a chair (including a wheelchair)?: A Little Help needed standing up from a chair using your arms (e.g., wheelchair or bedside chair)?: A Little Help needed to walk in hospital room?: A Lot Help needed climbing 3-5 steps with a railing? : Total 6 Click Score: 15    End of Session Equipment Utilized During Treatment: Gait belt Activity Tolerance: Patient limited by fatigue Patient left: in bed;with call bell/phone within reach;with bed alarm set;with SCD's reapplied Nurse Communication: Mobility status PT Visit Diagnosis: Unsteadiness on feet (R26.81);Other abnormalities of gait and mobility (  R26.89);Muscle weakness (generalized) (M62.81);Difficulty in walking, not elsewhere classified (R26.2)    Time: 9250-9185 PT Time Calculation (min) (ACUTE ONLY): 25 min   Charges:   PT Evaluation $PT Eval Moderate Complexity: 1 Mod PT Treatments $Therapeutic Activity: 8-22 mins PT General Charges $$ ACUTE PT VISIT: 1 Visit         Leontine Roads, PT, DPT [facility]  Rehabilitation Services Physical Therapist Office:  [phone] Website: [email or web address]   Leontine GORMAN Roads 02/25/2024, 8:54 AM

## 2024-02-25 NOTE — Progress Notes (Signed)
 NAME:  Anthony Nelson, MRN:  [id number], DOB:  10-07-1960, LOS: 3 ADMISSION DATE:  02/22/2024, CONSULTATION DATE: 02/22/2024 REFERRING MD:  Cottie Cough , CHIEF COMPLAINT: Altered mental status  History of Present Illness:  63 year old male with chronic hypoxic/hypercapnic respiratory failure on home oxygen  due to severe COPD was brought into the emergency department after he was found unresponsive by home health aide.  Per patient's family he was in his usual state of health until about 3 weeks ago when he started declining having increasing lethargy, generalized weakness, this morning home health aide found him unresponsive, not able to speak, EMS was summoned and he was brought to the emergency department in the ED patient was intubated due as he was aspirating.    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   Respiratory failure with hypoxia (HCC)     Significant Hospital Events: Including procedures, antibiotic start and stop dates in addition to other pertinent events   8/23 admitted with acute on chronic respiratory failure/went into PEA cardiac arrest, ROSC achieved after 9 minutes 8/24 Started spiking fever with Tmax 102, remained on vasopressor support with Levophed  and vasopressin , Levophed  requirement is improving, currently on 20 mics of Levophed  and 0.04 vasopressin  FiO2 titrated down to 40% 8/25 Agitation overnight    Interim History / Subjective:  Weaning off Levophed  and Precedex  Oriented on BIPAP   Objective    Blood pressure 125/86, pulse 60, temperature (!) 95.9 F (35.5 C), resp. rate 14, height 5' 9 (1.753 m), weight 51.7 kg, SpO2 97%. CVP:  [4 mmHg] 4 mmHg  Vent Mode: PCV;BIPAP FiO2 (%):  [40 %] 40 % Set Rate:  [15 bmp] 15 bmp Vt Set:  [440 mL] 440 mL PEEP:  [5 cmH20] 5 cmH20 Pressure Support:  [5 cmH20] 5 cmH20   Intake/Output Summary (Last 24 hours) at 02/25/2024 0846 Last data filed at 02/25/2024 0601 Gross per 24 hour  Intake 2327.38 ml  Output 1825 ml  Net 502.38 ml   Filed Weights   02/22/24 1330 02/23/24 0600 02/24/24 0708  Weight: 49.9 kg 50.3 kg 51.7 kg    Examination: General: acute on chronic cachetic older adult male, lying in icu bed on vent  HEENT: Normocephalic, PERRLA intact, ETT, OG, Pink MM, Poor dentition  CV: s1,s2, RRR, no MRG, No JVD  pulm: clear, diminished, no distress on vent  Abs: bs active, soft  Extremities: no edema, no deformity, moves all extremities on command-intermittently  Skin: no rash  Neuro: Rass -1 to -2, responds to painful stimuli, cough gag reflex present, follows commands  GU: foley intact     Patient Lines/Drains/Airways Status     Active Line/Drains/Airways     Name Placement date Placement time Site Days   Arterial Line 02/22/24 Left Radial 02/22/24  --  Radial  1   Peripheral IV 02/22/24 22 G Left;Posterior Forearm 02/22/24  1131  Forearm  1   CVC Triple Lumen 02/22/24  Left Internal jugular 02/22/24  1530  -- 1   NG/OG Vented/Dual Lumen Oral 02/22/24  --  Oral  1   Urethral Catheter Elsie Council RN Temperature probe 16 Fr. 02/23/24  0115  Temperature probe  less than 1   Airway 7.5 mm 02/22/24  1328  -- 1        Resolved problem list  Hypokalemia   Assessment and Plan  Status post PEA cardiac arrest in the setting of hypoxia Elevated Troponin  - Concerns of ST elevation on EKG- 8/25, troponin high sensitivity  102>194>87, suspect this to be demand  Patient went into PEA cardiac arrest, ROSC achieved after 9 minutes of CPR P: Continue cardiac tele  Continue ICU monitoring  Continue to utilize BIPAP  Continue to discuss  Continue to monitor BP per aline, wean levophed , SBP goal > 100  Will plan to remove art line later today  Continue GOC discussions with patient and family  Remove foley, place male purewick  Add midodrine  10mg  TID   Acute on chronic hypoxic/hypercapnic respiratory failure Acute severe COPD exacerbation Severe sepsis/septic shock due to bilateral multifocal pneumonia, POA 8/25- NGTD on BC, tracheal aspirate pending  8/26 Patient extubated, still requiring intermittent BIPAP Flu covid, RSV negative, WBC improving  P:  Continue BIPAP as needed  Continue scheduled duonebs Continue IV steroids for 5 days total Continue ceftriaxone /azithromycin - 7 days total  Continue aggressive pulmonary hygiene and oral care  Continue Goals of Care with patient and family  Acute septic encephalopathy Mixed metabolic disorder including metabolic alkalosis/metabolic  acidosis/with compensating respiratory acidosis - Mental status appears to be improving  P: Continue treatment as above  Wean off precedex , will dc Limit sedating medications  Continue delirium precautions encourage family presence   Cachexia/severe protein calorie malnutrition Hypoglycemia  P: DC d10, and SSI insulin  at this time  Continue to monitor CBGs q 4hrs Continue tube feeding per Cortrak, titrate to goal   Fluid/Electrolyte Derangements Hypomagnesemia-improving P: Continue to monitor electrolytes, daily BMETs with mag/phos Correct as needed   Anemia and thrombocytopenia of critical illness Hgb 11>9.8>9.1, no signs of bleeding  P: Continue to monitor for signs of bleeding Transfuse for hgb less than 7  GOC :  Discussed with family on 8/25 about GOC, and one way extubation. Goal to try to optimize  patient for successful one way extubation to BIPAP. Patient extubated yesterday and has required intermittent BIPAP. Did discuss with son and family that I am concerned of patient's overall status. Patient is cachectic and deconditioned at baseline with severe COPD. Appears that patient has been declining from sometime. According to family, has reported increase O2 needs at home with shortness of breath. Palliative was consulted as well to help navigate patients care. Also discussed with son and family that if patient continues to decline recommend comfort care measures. Son and family agreed that if patient were to decline, transition to comfort.  P: Continue to have GOC with patient and family  Hopefully as mentation continues to improve- patient can participate in GOC conversations in moving forward     Critical care time:  60 mins     Christian Ilah Boule AGACNP-BC   [facility] 02/25/2024, 12:35 PM  Please see [email or web address] for pager details.  From 7A-7P if no response, please call [phone]. After hours, please call ELink [phone].

## 2024-02-25 NOTE — TOC Progression Note (Incomplete)
 Transition of Care [facility]) - Progression Note    Patient Details  Name: Anthony Nelson MRN: [id number] Date of Birth: 10-29-1960  Transition of Care [facility] CM/SW Contact  Justina Delcia Czar, RN Phone Number: [phone] 02/25/2024, 4:52 PM  Clinical Narrative:    Spoke to pt and DIL, Endiah at bedside. Pt was not able to speak in sentences, but responding to his DIL. Pt was living at home alone. DIL states she wants LTAC, or SNF.   Patient [name] CM, [name] Lambing tele# [phone]      Expected Discharge Plan: Long Term Acute Care (LTAC) Barriers to Discharge: Continued Medical Work up               Expected Discharge Plan and Services   Discharge Planning Services: CM Consult Post Acute Care Choice: Long Term Acute Care (LTAC), Skilled Nursing Facility Living arrangements for the past 2 months: Single Family Home                                       Social Drivers of Health (SDOH) Interventions SDOH Screenings   Food Insecurity: Patient Unable To Answer (02/23/2024)  Housing: Unknown (02/23/2024)  Transportation Needs: Patient Unable To Answer (02/23/2024)  Recent Concern: Transportation Needs - Unmet Transportation Needs (01/13/2024)  Utilities: Patient Unable To Answer (02/23/2024)  Alcohol Screen: Low Risk  (12/26/2022)  Depression (PHQ2-9): Low Risk  (01/16/2023)  Physical Activity: Inactive (07/17/2023)  Social Connections: Moderately Isolated (01/13/2024)  Stress: No Stress Concern Present (12/26/2022)  Tobacco Use: Medium Risk (02/22/2024)    Readmission Risk Interventions    02/24/2024    8:57 AM 07/12/2023    3:16 PM  Readmission Risk Prevention Plan  Transportation Screening Complete Complete  Medication Review (RN Care Manager) Complete Referral to Pharmacy  PCP or Specialist appointment within 3-5 days of discharge Complete Complete  HRI or Home Care Consult Complete   SW Recovery Care/Counseling Consult Complete Complete  Palliative  Care Screening  Not Applicable  Skilled Nursing Facility  Not Applicable

## 2024-02-25 NOTE — Evaluation (Signed)
 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 male with PMH: chronic hypoxic/hypercapnic respiratory failure on home oxygen  due to severe COPD, GERD, bronchitis, pulmonary hypertension, diastolic heart failure, malnutrition. He presented to the hospital on 02/22/24 after being found unresponsive by home health aide. EMS was called and he was brought to ED.  He was intubated in ED as he was aspirating. He had witnessed PEA cardiac arrest upon arrival to ICU, CPR initiated and ROSC achieved after 9 minutes.  Overnight he had reduced responsiveness and was placed on BiPAP with improvement. Palliative care met with patient and also called patient's daughter in law to discuss POC/GOC. Family expressed understanding that after a one-way extubation, if patient starts to decline plan would change to comfort measures. Patient was extubated on 02/24/24.  SLP ordered to assess patient's ability to safely swallow.    Assessment / Plan / Recommendation  Clinical Impression  Patient did not present with any overt s/s aspiration with PO's of thin liquids (water ), ice chips or puree solids (applesauce). RR remained in range of 18-22 and SpO2 remained at 94-95%. Patient's  voice is low in intensity and mildly hoarse. Swallow initiation appears timely. Although he did not exhibit any overt s/s, he has a high risk of decompensation if he were to aspirate and cannot r/o silent aspiration. SLP recommending continue NPO but allow PRN water  sips, ice chips. SLP will follow. SLP Visit Diagnosis: Dysphagia, unspecified (R13.10)    Aspiration Risk  Mild aspiration risk;Risk for inadequate nutrition/hydration    Diet Recommendation NPO;Ice chips PRN after oral care;Free water  protocol after oral care    Liquid Administration via: Cup;Straw Medication Administration: Via alternative means Supervision: Patient able to self feed;Full supervision/cueing for compensatory strategies Compensations: Slow rate;Small sips/bites Postural Changes: Seated upright at 90 degrees    Other  Recommendations Oral Care Recommendations: Oral care QID;Oral care prior to ice chip/H20;Oral care before and after PO     Assistance Recommended at Discharge    Functional Status Assessment Patient has had a recent decline in their functional status and demonstrates the ability to make significant improvements in function in a reasonable and predictable amount of time.  Frequency and Duration min 2x/week  1 week       Prognosis Prognosis for improved oropharyngeal function: Fair Barriers to Reach Goals: Severity of deficits      Swallow Study   General Date of Onset: 02/25/24 HPI: Patient is a 63 y.o. male with PMH: chronic hypoxic/hypercapnic respiratory failure on home oxygen  due to severe COPD, GERD, bronchitis, pulmonary hypertension, diastolic heart failure, malnutrition. He presented to the hospital on 02/22/24 after being found unresponsive by home health aide. EMS was called and he was brought to ED.  He was intubated in ED as he was aspirating. He had witnessed PEA cardiac arrest upon arrival to ICU, CPR initiated and ROSC achieved after 9 minutes.  Overnight he had reduced  responsiveness and was placed on BiPAP with improvement. Palliative care met with patient and also called patient's daughter in law to discuss POC/GOC. Family expressed understanding that after a one-way extubation, if patient starts to decline plan would change to comfort measures. Patient was extubated on 02/24/24.  SLP ordered to assess patient's ability to safely swallow. Type of Study: Bedside Swallow Evaluation Previous Swallow Assessment: none found Diet Prior to this Study: NPO Temperature Spikes Noted: No Respiratory Status: Nasal cannula History of Recent Intubation: Yes Total duration of intubation (days): 3 days Date extubated: 02/24/24 Behavior/Cognition: Alert;Cooperative;Pleasant mood Oral Cavity Assessment: Dry Oral Care Completed by SLP: Yes Oral Cavity - Dentition: Adequate natural dentition;Poor condition Vision: Functional for self-feeding Self-Feeding Abilities: Able to feed self Patient Positioning: Upright in bed Baseline Vocal Quality: Hoarse;Low vocal intensity Volitional Cough: Congested;Weak Volitional Swallow: Able to elicit    Oral/Motor/Sensory Function Overall Oral Motor/Sensory Function: Within functional limits   Ice Chips Ice chips: Within functional limits   Thin Liquid Thin Liquid: Within functional limits Presentation: Self Fed;Straw;Cup    Nectar Thick     Honey Thick     Puree Puree: Within functional limits Presentation: Self Fed;Spoon   Solid     Solid: Not tested     Anthony IVAR Blase, MA, CCC-SLP Speech Therapy

## 2024-02-25 NOTE — Progress Notes (Signed)
 [facility] ADULT ICU REPLACEMENT PROTOCOL   The patient does apply for the [facility] Adult ICU Electrolyte Replacment Protocol based on the criteria listed below:   1.Exclusion criteria: TCTS, ECMO, Dialysis, and Myasthenia Gravis patients 2. Is GFR >/= 30 ml/min? Yes.    Patient's GFR today is >60 3. Is SCr </= 2? Yes.   Patient's SCr is 0.49 mg/dL 4. Did SCr increase >/= 0.5 in 24 hours? No. 5.Pt's weight >40kg  Yes.   6. Abnormal electrolyte(s): Mag 1.9  7. Electrolytes replaced per protocol 8.  Call MD STAT for K+ </= 2.5, Phos </= 1, or Mag </= 1 Physician:  Dr. Haze Rummer, Recardo ORN 02/25/2024 4:12 AM

## 2024-02-25 NOTE — Progress Notes (Signed)
 Nutrition Follow-up  DOCUMENTATION CODES:   Severe malnutrition in context of chronic illness, Underweight  INTERVENTION:   Tube Feeding via Cortrak:  Vital AF 1.2 at 55 ml/hr Continue to titrate by 10 ml/hr q 12 hours until goal rate of 55 ml/hr  TF at goal rate provides 1584 kcals, 99 g of protein and 1069 mL of free water    Monitor magnesium , potassium, and phosphorus daily for at least 3 days, MD to replete as needed, as pt is at risk for refeeding syndrome    Add Thiamine  100 mg x 7 days  NUTRITION DIAGNOSIS:   Inadequate oral intake related to acute illness, catabolic illness as evidenced by NPO status, per patient/family report.  Being addressed via TF   GOAL:   Patient will meet greater than or equal to 90% of their needs  Progressing   MONITOR:   Vent status, Labs, Weight trends, TF tolerance, Skin  REASON FOR ASSESSMENT:   Consult Enteral/tube feeding initiation and management  ASSESSMENT:   63 yo male admitted with acute on chronic respiratory failure after being found down at home, pt intubated upon arrival to ED, admitted to ICU and then had witnessed PEA arrest. PMH includes severe COPD, chronic respiratory failure on home oxygen , malnutrition, HF.  8/23 Admitted, Intubated, +witness in-hospital PEA arrest  8/25 Cortrak placed, Extubated to BiPap, TF initiated  Extubated yesterday, on and off BiPap. Currently off Bipap upon visit today. Family at bedside.   TF currently infusing at 35 ml/hr, tolerating thus far. Goal rate 55 ml/hr  D10 and SS insulin  discontinued today  No new weight today  Labs: Potassium 4.1 (wdl) Magnesium  1.9 (wdl) Phosphorus 3.5 (wdl) Creatinine 0.49 (L)  Meds:  Solumedrol Midodrine  Thiamine  100 mg x 7 days  NUTRITION - FOCUSED PHYSICAL EXAM:  Flowsheet Row Most Recent Value  Orbital Region Severe depletion  Upper Arm Region Severe depletion  Thoracic and Lumbar Region Severe depletion  Buccal Region Severe  depletion  Temple Region Severe depletion  Clavicle Bone Region Severe depletion  Clavicle and Acromion Bone Region Severe depletion  Scapular Bone Region Severe depletion  Dorsal Hand Moderate depletion  Patellar Region Severe depletion  Anterior Thigh Region Severe depletion  Posterior Calf Region Severe depletion  Edema (RD Assessment) None  Mouth --  [poor dentition]    Diet Order:   Diet Order             Diet NPO time specified  Diet effective now                   EDUCATION NEEDS:   Not appropriate for education at this time  Skin:  Skin Assessment: Skin Integrity Issues: Skin Integrity Issues:: Stage II Stage II: mid sacrum  Last BM:  8/26 type 2 large  Height:   Ht Readings from Last 1 Encounters:  02/23/24 5' 9 (1.753 m)    Weight:   Wt Readings from Last 1 Encounters:  02/24/24 51.7 kg     BMI:  Body mass index is 16.83 kg/m.  Estimated Nutritional Needs:   Kcal:  1500-1700 kcals  Protein:  75-90 g  Fluid:  >/= 1.7 L   Betsey Finger MS, RDN, LDN, CNSC Registered Dietitian 3 Clinical Nutrition RD Inpatient Contact Info in Amion

## 2024-02-25 NOTE — Progress Notes (Signed)
 Daily Progress Note   Patient Name: Anthony Nelson       Date: 02/25/2024 DOB: 08-09-1960  Age: 63 y.o. MRN#: [id number] Attending Physician: Gretta Leita SQUIBB, DO Primary Care Physician: [facility] Admit Date: 02/22/2024  Reason for Consultation/Follow-up: Establishing goals of care  Subjective: Medical records reviewed including progress notes, labs, imaging. Patient assessed at the bedside.  He is on BiPAP, easily aroused.  He nods yes and no appropriately to questions.  Denies pain or distress.  No family was present during my visit.  Introduced role of palliative medicine to patient, emphasizing the importance of continued conversation with family and the medical providers regarding overall plan of care and treatment options, ensuring decisions are within the context of the patient's values and GOCs.  Encouraged him to consider any limitations or boundaries he may have for his care.  He nodded his head in understanding.  I called patient's daughter-in-law Laurena to provide an update and she was appreciative.  I also called patient's son to provide an update on the above interaction.  He would like to undergo patient's wishes as much as possible, though very concerned that patient will be stubborn and not accepting of the fact that he is in worsening health with need of more support.  Described patient as stubborn and independent.  I provided reassurance that we would support patient in coming to terms with his illness and realistic/safe options.  He was appreciative.  Questions and concerns addressed. PMT will continue to support holistically.   Length of Stay: 3   Physical Exam Vitals and nursing note reviewed.  Constitutional:      General: He is not in acute distress.    Appearance: He is ill-appearing.  HENT:     Head:  Normocephalic and atraumatic.  Cardiovascular:     Rate and Rhythm: Normal rate.  Pulmonary:     Effort: Pulmonary effort is normal.     Comments: BiPAP in place Neurological:     Mental Status: He is alert.             Vital Signs: BP 125/86   Pulse 60   Temp (!) 95.9 F (35.5 C)   Resp 14   Ht 5' 9 (1.753 m)   Wt 51.7 kg   SpO2 97%   BMI 16.83 kg/m  SpO2: SpO2: 97 % O2 Device: O2 Device: Bi-PAP O2 Flow Rate: O2 Flow Rate (L/min): 4 L/min      Palliative Assessment/Data: 30% (on tube feeds)   Palliative Care Assessment & Plan   Patient Profile: 63 y.o. male  with past  medical history of chronic hypoxic/hypercapnic respiratory failure on home oxygen  due to severe COPD admitted on 02/22/2024 with unresponsiveness found by home health aide, weeks of increased lethargy, generalized weakness.    In the ED, patient was intubated and had a witnessed PEA cardiac arrest with CPR initiated and ROSC in 9 minutes.  He was admitted to the ICU for acute COPD exacerbation, acute on chronic respiratory failure, severe sepsis/septic shock due to bilateral multifocal pneumonia requiring vasopressor support.   PMT has been consulted to assist with goals of care conversation.  Assessment: Goals of care conversation Status post PEA cardiac arrest Acute on chronic respiratory failure with hypoxia and hypercapnia Severe protein calorie malnutrition Acute encephalopathy, improving Bilateral multifocal pneumonia Acute COPD exacerbation  Recommendations/Plan: Continue DNR/DNI Continue current care Ongoing goals of care discussions.  Patient's son is concerned that he would not be amendable to placement in a facility if he continues to improve Psychosocial and emotional support provided PMT will continue to follow and support   Prognosis: Poor long-term prognosis given acute on chronic disease and high risk for further decline/decompensation  Discharge Planning: To Be  Determined  Care plan was discussed with patient, patient's son, patient's ex daughter-in-law          Mickle SHAUNNA Fell, PA-C  Palliative Medicine Team Team phone # [phone]  Thank you for allowing the Palliative Medicine Team to assist in the care of this patient. Please utilize secure chat with additional questions, if there is no response within 30 minutes please call the above phone number.  Palliative Medicine Team providers are available by phone from 7am to 7pm daily and can be reached through the team cell phone.  Should this patient require assistance outside of these hours, please call the patient's attending physician.     Time Total: 35  Visit consisted of counseling and education dealing with the complex and emotionally intense issues of symptom management and palliative care in the setting of serious and potentially life-threatening illness. Greater than 50% of this time was spent counseling and coordinating care related to the above assessment and plan.  Personally spent 35 minutes in patient care including extensive chart review (labs, imaging, progress/consult notes, vital signs), medically appropraite exam, discussed with treatment team, education to patient, family, and staff, documenting clinical information, medication review and management, coordination of care, and available advanced directive documents.

## 2024-02-26 DIAGNOSIS — Z515 Encounter for palliative care: Secondary | ICD-10-CM | POA: Diagnosis not present

## 2024-02-26 DIAGNOSIS — J9621 Acute and chronic respiratory failure with hypoxia: Secondary | ICD-10-CM | POA: Diagnosis not present

## 2024-02-26 DIAGNOSIS — J9622 Acute and chronic respiratory failure with hypercapnia: Secondary | ICD-10-CM | POA: Diagnosis not present

## 2024-02-26 DIAGNOSIS — J9602 Acute respiratory failure with hypercapnia: Secondary | ICD-10-CM | POA: Diagnosis not present

## 2024-02-26 DIAGNOSIS — L8990 Pressure ulcer of unspecified site, unspecified stage: Secondary | ICD-10-CM | POA: Insufficient documentation

## 2024-02-26 DIAGNOSIS — J441 Chronic obstructive pulmonary disease with (acute) exacerbation: Secondary | ICD-10-CM | POA: Diagnosis not present

## 2024-02-26 DIAGNOSIS — R5381 Other malaise: Secondary | ICD-10-CM

## 2024-02-26 DIAGNOSIS — I469 Cardiac arrest, cause unspecified: Secondary | ICD-10-CM | POA: Diagnosis not present

## 2024-02-26 LAB — CBC
HCT: 27.9 % — ABNORMAL LOW (ref 39.0–52.0)
Hemoglobin: 8.9 g/dL — ABNORMAL LOW (ref 13.0–17.0)
MCH: 32.1 pg (ref 26.0–34.0)
MCHC: 31.9 g/dL (ref 30.0–36.0)
MCV: 100.7 fL — ABNORMAL HIGH (ref 80.0–100.0)
Platelets: 86 K/uL — ABNORMAL LOW (ref 150–400)
RBC: 2.77 MIL/uL — ABNORMAL LOW (ref 4.22–5.81)
RDW: 12.7 % (ref 11.5–15.5)
WBC: 11.7 K/uL — ABNORMAL HIGH (ref 4.0–10.5)
nRBC: 0 % (ref 0.0–0.2)

## 2024-02-26 LAB — BASIC METABOLIC PANEL WITH GFR
Anion gap: 5 (ref 5–15)
BUN: 16 mg/dL (ref 8–23)
CO2: 37 mmol/L — ABNORMAL HIGH (ref 22–32)
Calcium: 8.5 mg/dL — ABNORMAL LOW (ref 8.9–10.3)
Chloride: 96 mmol/L — ABNORMAL LOW (ref 98–111)
Creatinine, Ser: 0.56 mg/dL — ABNORMAL LOW (ref 0.61–1.24)
GFR, Estimated: >60 mL/min (ref >60)
Glucose, Bld: 66 mg/dL — ABNORMAL LOW (ref 70–99)
Potassium: 3.6 mmol/L (ref 3.5–5.1)
Sodium: 138 mmol/L (ref 135–145)

## 2024-02-26 LAB — GLUCOSE, CAPILLARY
Glucose-Capillary: 72 mg/dL (ref 70–99)
Glucose-Capillary: 69 mg/dL — ABNORMAL LOW (ref 70–99)
Glucose-Capillary: 72 mg/dL (ref 70–99)
Glucose-Capillary: 74 mg/dL (ref 70–99)
Glucose-Capillary: 126 mg/dL — ABNORMAL HIGH (ref 70–99)
Glucose-Capillary: 87 mg/dL (ref 70–99)

## 2024-02-26 LAB — MAGNESIUM: Magnesium: 1.7 mg/dL (ref 1.7–2.4)

## 2024-02-26 MED ORDER — POTASSIUM CHLORIDE 20 MEQ PO PACK
40.0000 meq | PACK | Freq: Once | ORAL | Status: AC
  Administered 2024-02-26: 40 meq
  Filled 2024-02-26: qty 2

## 2024-02-26 MED ORDER — VANCOMYCIN HCL IN DEXTROSE 1-5 GM/200ML-% IV SOLN
1000.0000 mg | INTRAVENOUS | Status: DC
  Filled 2024-02-26: qty 200

## 2024-02-26 MED ORDER — MAGNESIUM SULFATE 4 GM/100ML IV SOLN
4.0000 g | Freq: Once | INTRAVENOUS | Status: AC
  Administered 2024-02-26: 4 g via INTRAVENOUS
  Filled 2024-02-26: qty 100

## 2024-02-26 MED ORDER — VANCOMYCIN HCL 1250 MG/250ML IV SOLN
1250.0000 mg | Freq: Once | INTRAVENOUS | Status: AC
  Administered 2024-02-26: 1250 mg via INTRAVENOUS
  Filled 2024-02-26: qty 250

## 2024-02-26 MED ORDER — THIAMINE MONONITRATE 100 MG PO TABS
100.0000 mg | ORAL_TABLET | Freq: Every day | ORAL | Status: AC
  Administered 2024-02-27 – 2024-03-01 (×4): 100 mg via ORAL
  Filled 2024-02-26 (×4): qty 1

## 2024-02-26 MED ORDER — PREDNISONE 20 MG PO TABS
40.0000 mg | ORAL_TABLET | Freq: Every day | ORAL | Status: DC
  Administered 2024-02-27 – 2024-02-28 (×2): 40 mg via ORAL
  Filled 2024-02-26 (×2): qty 2

## 2024-02-26 NOTE — Progress Notes (Signed)
 Pharmacy Antibiotic Note  Anthony Nelson is a 63 y.o. male admitted on 02/22/2024 with cellulitis.  Pharmacy has been consulted for vancomycin  dosing.  Patient currently on antibiotics for CAP coverage. Concern for cellulitis after scratching at blisters on his legs. WBC 11.7, afebrile, Scr 0.56 (at baseline).   Plan: Vancomycin  1250 mg IV x1 load (~25mg /kg) Start vancomycin  1000mg  IV q24h (eAUC 494, Scr rounded up to 0.8, Vd 0.72) Monitor kidney function, clinical status, and levels as indicated F/u length of treatment  Height: 5' 9 (175.3 cm) Weight: 48.7 kg (107 lb 5.8 oz) IBW/kg (Calculated) : 70.7  Temp (24hrs), Avg:98.7 F (37.1 C), Min:97.9 F (36.6 C), Max:99.9 F (37.7 C)  Recent Labs  Lab 02/22/24 1209 02/22/24 1606 02/22/24 1607 02/23/24 0349 02/23/24 0815 02/23/24 1519 02/24/24 0627 02/24/24 0721 02/24/24 1552 02/25/24 0146 02/26/24 0547  WBC  --  9.9  --  11.4*  --   --  15.3*  --   --  15.1* 11.7*  CREATININE  --  1.12  --  0.90  --  0.70 0.71  --  0.52* 0.49* 0.56*  LATICACIDVEN 1.0  --  15.0*  --  2.9*  --   --  2.2*  --   --   --     Estimated Creatinine Clearance: 64.3 mL/min (A) (by C-G formula based on SCr of 0.56 mg/dL (L)).    Allergies  Allergen Reactions   Erythromycin Other (See Comments)    Allergy from childhood- does not take it    Antimicrobials this admission: Ceftriaxone  8/23 >>  Azithromycin  8/23 >>  Vancomycin  8/27 >>  Microbiology results: 8/23 BCx: ngtd x4d 8/23 MRSA PCR: negative  Thank you for allowing pharmacy to be a part of this patient's care.   Nidia Schaffer, PharmD PGY2 Cardiology Pharmacy Resident  Please check AMION for all [facility] Pharmacy phone numbers After 10:00 PM, call Main Pharmacy [phone] 02/26/2024 10:25 AM

## 2024-02-26 NOTE — Progress Notes (Signed)
 Speech Language Pathology Treatment: Dysphagia  Patient Details Name: Anthony Nelson MRN: [id number] DOB: Dec 22, 1960 Today's Date: 02/26/2024 Time: 8379-8362 SLP Time Calculation (min) (ACUTE ONLY): 17 min  Assessment / Plan / Recommendation Clinical Impression  Patient was upright in bed with nurse administering new meds through IV when SLP entered room. Cousin was in the room. Patient was requesting to eat more foods. Patient drank water  without any s/s aspiration. Patient ate graham crackers independently without any s/s aspiration. Patient spoon fed himself chocolate pudding without any s/s aspiration. SLP educated patient on continuing to drink and eat slowly. Recommended regular diet with thin liquids. No follow up needed.   HPI HPI: Patient is a 63 y.o. male with PMH: chronic hypoxic/hypercapnic respiratory failure on home oxygen  due to severe COPD, GERD, bronchitis, pulmonary hypertension, diastolic heart failure, malnutrition. He presented to the hospital on 02/22/24 after being found unresponsive by home health aide. EMS was called and he was brought to ED.  He was intubated in ED as he was aspirating. He had witnessed PEA cardiac arrest upon arrival to ICU, CPR initiated and ROSC achieved after 9 minutes.  Overnight he had reduced responsiveness and was placed on BiPAP with improvement. Palliative care met with patient and also called patient's daughter in law to discuss POC/GOC. Family expressed understanding that after a one-way extubation, if patient starts to decline plan would change to comfort measures. Patient was extubated on 02/24/24.  SLP ordered to assess patient's ability to safely swallow.      SLP Plan  All goals met          Recommendations  Diet recommendations: Regular;Thin liquid Liquids provided via: Teaspoon;Cup;Straw Medication Administration: Other (Comment) (as tolerated) Supervision: Patient able to self feed Compensations: Slow rate;Small sips/bites Postural  Changes and/or Swallow Maneuvers: Seated upright 90 degrees;Upright 30-60 min after meal                  Oral care BID           All goals met    Marikay Roads  Graduate SLP Clinican

## 2024-02-26 NOTE — Progress Notes (Signed)
 Daily Progress Note   Patient Name: Anthony Nelson       Date: 02/26/2024 DOB: 02/15/1961  Age: 63 y.o. MRN#: [id number] Attending Physician: Gretta Leita SQUIBB, DO Primary Care Physician: [facility] Admit Date: 02/22/2024  Reason for Consultation/Follow-up: Establishing goals of care  Subjective: Medical records reviewed including progress notes, labs, imaging. Patient assessed at the bedside.  He denies pain or distress, says he does remember meeting me yesterday morning and discussing role of palliative medicine.  RN shares that he is about to transfer to new unit.  I shared that I would attempt to see him on his new unit to discuss next steps in his care, and his preferences, and goals moving forward.  He was agreeable.  No other concerns at this time.  Questions and concerns addressed. PMT will continue to support holistically.   Length of Stay: 4   Physical Exam Vitals and nursing note reviewed.  Constitutional:      General: He is not in acute distress.    Appearance: He is ill-appearing.  HENT:     Head: Normocephalic and atraumatic.  Cardiovascular:     Rate and Rhythm: Normal rate.  Pulmonary:     Effort: Pulmonary effort is normal.  Neurological:     Mental Status: He is alert.             Vital Signs: BP 107/67   Pulse 78   Temp 98.6 F (37 C) (Oral)   Resp 16   Ht 5' 9 (1.753 m)   Wt 48.7 kg   SpO2 100%   BMI 15.85 kg/m  SpO2: SpO2: 100 % O2 Device: O2 Device: Nasal Cannula O2 Flow Rate: O2 Flow Rate (L/min): 5 L/min      Palliative Assessment/Data: 30% (on tube feeds)   Palliative Care Assessment & Plan   Patient Profile: 63 y.o. male  with past medical history of chronic hypoxic/hypercapnic respiratory failure on home oxygen  due to severe COPD admitted on 02/22/2024 with unresponsiveness  found by home health aide, weeks of increased lethargy, generalized weakness.    In the ED, patient was intubated and had a witnessed PEA cardiac arrest with CPR initiated and ROSC in 9 minutes.  He was admitted to the ICU for acute COPD exacerbation, acute on chronic respiratory failure, severe sepsis/septic shock due to bilateral multifocal pneumonia requiring vasopressor support.   PMT has been consulted to assist with goals of care conversation.  Assessment: Goals of care conversation Status post PEA cardiac arrest Acute on chronic respiratory failure with hypoxia and hypercapnia Severe protein calorie malnutrition Acute encephalopathy, improving Bilateral multifocal pneumonia Acute COPD exacerbation  Recommendations/Plan: Continue DNR/DNI Continue current care Ongoing goals of care discussions particularly with ongoing nutritional needs, likely placement needs Psychosocial and emotional support provided PMT will continue to follow and support   Prognosis: Poor long-term prognosis given acute on chronic disease and high risk for further decline/decompensation  Discharge Planning: To Be Determined  Care plan was discussed with patient, RN          Mickle SQUIBB Fell, PA-C  Palliative Medicine Team Team phone # [phone]  Thank you for allowing the Palliative Medicine Team to assist  in the care of this patient. Please utilize secure chat with additional questions, if there is no response within 30 minutes please call the above phone number.  Palliative Medicine Team providers are available by phone from 7am to 7pm daily and can be reached through the team cell phone.  Should this patient require assistance outside of these hours, please call the patient's attending physician.     Time Total: 25  Visit consisted of counseling and education dealing with the complex and emotionally intense issues of symptom management and palliative care in the setting of serious and  potentially life-threatening illness. Greater than 50% of this time was spent counseling and coordinating care related to the above assessment and plan.  Personally spent 25 minutes in patient care including extensive chart review (labs, imaging, progress/consult notes, vital signs), medically appropraite exam, discussed with treatment team, education to patient, family, and staff, documenting clinical information, medication review and management, coordination of care, and available advanced directive documents.

## 2024-02-26 NOTE — Progress Notes (Signed)
 NAME:  Anthony Nelson, MRN:  [id number], DOB:  1961-04-10, LOS: 4 ADMISSION DATE:  02/22/2024, CONSULTATION DATE: 02/22/2024 REFERRING MD:  Cottie Cough , CHIEF COMPLAINT: Altered mental status  History of Present Illness:  63 year old male with chronic hypoxic/hypercapnic respiratory failure on home oxygen  due to severe COPD was brought into the emergency department after he was found unresponsive by home health aide.  Per patient's family he was in his usual state of health until about 3 weeks ago when he started declining having increasing lethargy, generalized weakness, this morning home health aide found him unresponsive, not able to speak, EMS was summoned and he was brought to the emergency department in the ED patient was intubated due as he was aspirating.    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   Respiratory failure with hypoxia (HCC)     Significant Hospital Events: Including procedures, antibiotic start and stop dates in addition to other pertinent events   8/23 admitted with acute on chronic respiratory failure/went into PEA cardiac arrest, ROSC achieved after 9 minutes 8/24 Started spiking fever with Tmax 102, remained on vasopressor support with Levophed  and vasopressin , Levophed  requirement is improving, currently on 20 mics of Levophed  and 0.04 vasopressin  FiO2 titrated down to 40% 8/25 Agitation overnight    Interim History / Subjective:  Off levophed   Not needing BIPAP frequently overnight  Patient alert, oriented, follows commands, no complaints   Two serous blisters on medial aspect of left lower extremity  Redness on left lower shin   Objective    Blood pressure 107/67, pulse 78, temperature 98.6 F (37 C), temperature source Oral, resp. rate 16, height 5' 9 (1.753 m), weight 48.7 kg, SpO2 100%. CVP:  [0 mmHg-16 mmHg] 1 mmHg  Vent Mode: BIPAP;PCV FiO2 (%):  [40 %] 40 % Set Rate:  [15 bmp] 15 bmp PEEP:  [5 cmH20] 5 cmH20   Intake/Output Summary (Last 24 hours) at 02/26/2024 0750 Last data filed at 02/26/2024 9353 Gross per 24 hour  Intake 1111.58 ml  Output 1645 ml  Net -533.42 ml   Filed Weights   02/23/24 0600 02/24/24 0708 02/26/24 0500  Weight: 50.3 kg 51.7 kg 48.7 kg    Examination: General: acute on chronic elderly male, sitting up in ICU bed, no distress  HEENT: Normocephalic, PERRLA intact, Pink MM CV: s1,s2, RRR, no MRG, No JVD  pulm: clear, diminished, on 4 to 5L [place] Abs: bs active, soft  Extremities: no edema, moves all extremities on command  Skin: 2 serous blisters on medial aspect on knee  Neuro: Rass 0, follows commands, alert and oriented x 3 to 4  GU: male purewick    Resolved problem list  Hypokalemia   Assessment and Plan  Status post PEA cardiac arrest in the setting of hypoxia Elevated Troponin  - Concerns of ST elevation on EKG- 8/25, troponin high sensitivity 102>194>87, suspect this to be demand  Patient went into PEA cardiac  arrest, ROSC achieved after 9 minutes of CPR P: Patient improving off pressors  Continue Midodrine  10mg  TID  Remove CVC- obtain additional PIVs Transfer out of ICU to progressive level of care   Acute on chronic hypoxic/hypercapnic respiratory failure Acute severe COPD exacerbation Severe sepsis/septic shock due to bilateral multifocal pneumonia, POA 8/25- NGTD on BC, tracheal aspirate pending  8/26 Patient  extubated, still requiring intermittent BIPAP Flu covid, RSV negative, WBC improving  P:  Continue BIPAP as needed Transition IV steroids to oral prednisone   Continue ceftriaxone /azithromycin  for 7 days  Continue aggressive pulmonary hygiene  Continue GOCs with family  Palliative care following appreciate assistance   Acute septic encephalopathy-resolving  Mixed metabolic disorder including metabolic alkalosis/metabolic  acidosis/with compensating respiratory acidosis - Mental status appears to be improving  P: Limit sedating meds Continue delirium precautions  Mobilize- PT/OT consult   Cachexia/severe protein calorie malnutrition Hypoglycemia  P: Increase tube feeds to goal  Continue to monitor for hypoglycemia  SSI dc yesterday on 8/28   Fluid/Electrolyte Derangements Hypomagnesemia-improving K 3.6, Mag 1.7  P: Continue to monitor electrolytes  Continue daily BMETS with mag and phos  Given 40 meq of K and replaced with 4 grams of Mag   Anemia and thrombocytopenia of critical illness Hgb 11>9.8>9.1>8.9, no signs of bleeding  P: Continue to monitor for signs of bleeding Transfuse for hgb less than 7   Serous Blisters LLE  On medial aspect below knee, redness distal beyond blisters - concern for developing cellulitis  P: On ceftriaxone  as above, add vanc   GOC :  Discussed with family on 8/25 about GOC, and one way extubation. Goal to try to optimize patient for successful one way extubation to BIPAP. Patient extubated yesterday and has required intermittent BIPAP. Did discuss with son and family that I am concerned of patient's overall status. Patient is cachectic and deconditioned at baseline with severe COPD. Appears that patient has been declining from sometime. According to family, has reported increase O2 needs at home with shortness of breath. Palliative was consulted as well to help navigate patients care. Also discussed with son and family that if patient  continues to decline recommend comfort care measures. Son and family agreed that if patient were to decline, transition to comfort.  P: Continue GOC with patient and family Palliative care following appreciate assistance    Critical care time:  60 mins     Christian Prue Lingenfelter AGACNP-BC   [facility] 02/26/2024, 7:50 AM  Please see [email or web address] for pager details.  From 7A-7P if no response, please call [phone]. After hours, please call ELink [phone].

## 2024-02-27 DIAGNOSIS — Z515 Encounter for palliative care: Secondary | ICD-10-CM | POA: Diagnosis not present

## 2024-02-27 DIAGNOSIS — Z7189 Other specified counseling: Secondary | ICD-10-CM | POA: Diagnosis not present

## 2024-02-27 DIAGNOSIS — R5381 Other malaise: Secondary | ICD-10-CM | POA: Diagnosis not present

## 2024-02-27 DIAGNOSIS — J9621 Acute and chronic respiratory failure with hypoxia: Secondary | ICD-10-CM | POA: Diagnosis not present

## 2024-02-27 LAB — CBC
HCT: 29.7 % — ABNORMAL LOW (ref 39.0–52.0)
Hemoglobin: 9.5 g/dL — ABNORMAL LOW (ref 13.0–17.0)
MCH: 32.3 pg (ref 26.0–34.0)
MCHC: 32 g/dL (ref 30.0–36.0)
MCV: 101 fL — ABNORMAL HIGH (ref 80.0–100.0)
Platelets: 107 K/uL — ABNORMAL LOW (ref 150–400)
RBC: 2.94 MIL/uL — ABNORMAL LOW (ref 4.22–5.81)
RDW: 12.5 % (ref 11.5–15.5)
WBC: 9.6 K/uL (ref 4.0–10.5)
nRBC: 0 % (ref 0.0–0.2)

## 2024-02-27 LAB — BASIC METABOLIC PANEL WITH GFR
Anion gap: 6 (ref 5–15)
BUN: 13 mg/dL (ref 8–23)
CO2: 41 mmol/L — ABNORMAL HIGH (ref 22–32)
Calcium: 8.5 mg/dL — ABNORMAL LOW (ref 8.9–10.3)
Chloride: 92 mmol/L — ABNORMAL LOW (ref 98–111)
Creatinine, Ser: 0.44 mg/dL — ABNORMAL LOW (ref 0.61–1.24)
GFR, Estimated: >60 mL/min (ref >60)
Glucose, Bld: 101 mg/dL — ABNORMAL HIGH (ref 70–99)
Potassium: 3.7 mmol/L (ref 3.5–5.1)
Sodium: 139 mmol/L (ref 135–145)

## 2024-02-27 LAB — GLUCOSE, CAPILLARY
Glucose-Capillary: 106 mg/dL — ABNORMAL HIGH (ref 70–99)
Glucose-Capillary: 119 mg/dL — ABNORMAL HIGH (ref 70–99)
Glucose-Capillary: 138 mg/dL — ABNORMAL HIGH (ref 70–99)
Glucose-Capillary: 176 mg/dL — ABNORMAL HIGH (ref 70–99)
Glucose-Capillary: 196 mg/dL — ABNORMAL HIGH (ref 70–99)
Glucose-Capillary: 91 mg/dL (ref 70–99)

## 2024-02-27 LAB — MAGNESIUM: Magnesium: 1.8 mg/dL (ref 1.7–2.4)

## 2024-02-27 MED ORDER — ASPIRIN 81 MG PO TBEC
81.0000 mg | DELAYED_RELEASE_TABLET | Freq: Every day | ORAL | Status: DC
  Administered 2024-02-27 – 2024-03-04 (×7): 81 mg via ORAL
  Filled 2024-02-27 (×7): qty 1

## 2024-02-27 MED ORDER — VANCOMYCIN HCL 1000 MG IV SOLR
1000.0000 mg | INTRAVENOUS | Status: DC
  Administered 2024-02-27: 1000 mg via INTRAVENOUS
  Filled 2024-02-27 (×2): qty 20

## 2024-02-27 MED ORDER — HYDRALAZINE HCL 20 MG/ML IJ SOLN: 10.0000 mg | INTRAMUSCULAR | Status: DC | PRN

## 2024-02-27 MED ORDER — OSMOLITE 1.5 CAL PO LIQD
1000.0000 mL | ORAL | Status: DC
  Administered 2024-02-27 – 2024-02-28 (×2): 1000 mL

## 2024-02-27 MED ORDER — GLUCAGON HCL RDNA (DIAGNOSTIC) 1 MG IJ SOLR: 1.0000 mg | INTRAMUSCULAR | Status: DC | PRN

## 2024-02-27 MED ORDER — ONDANSETRON HCL 4 MG/2ML IJ SOLN: 4.0000 mg | Freq: Four times a day (QID) | INTRAMUSCULAR | Status: DC | PRN

## 2024-02-27 MED ORDER — ADULT MULTIVITAMIN W/MINERALS CH
1.0000 | ORAL_TABLET | Freq: Every day | ORAL | Status: DC
  Administered 2024-02-27 – 2024-03-04 (×7): 1 via ORAL
  Filled 2024-02-27 (×7): qty 1

## 2024-02-27 MED ORDER — METOPROLOL TARTRATE 5 MG/5ML IV SOLN: 5.0000 mg | INTRAVENOUS | Status: DC | PRN

## 2024-02-27 MED ORDER — ENSURE PLUS HIGH PROTEIN PO LIQD
237.0000 mL | Freq: Two times a day (BID) | ORAL | Status: DC
  Administered 2024-02-27 – 2024-03-04 (×12): 237 mL via ORAL

## 2024-02-27 MED ORDER — PROSOURCE TF20 ENFIT COMPATIBL EN LIQD
60.0000 mL | Freq: Every day | ENTERAL | Status: DC
  Administered 2024-02-27 – 2024-02-29 (×3): 60 mL
  Filled 2024-02-27 (×3): qty 60

## 2024-02-27 MED ORDER — JUVEN PO PACK
1.0000 | PACK | Freq: Two times a day (BID) | ORAL | Status: DC
  Administered 2024-02-27 – 2024-03-04 (×13): 1 via ORAL
  Filled 2024-02-27 (×13): qty 1

## 2024-02-27 NOTE — Plan of Care (Signed)

## 2024-02-27 NOTE — Progress Notes (Signed)
 Daily Progress Note   Patient Name: Anthony Nelson       Date: 02/27/2024 DOB: 10-Feb-1961  Age: 62 y.o. MRN#: [id number] Attending Physician: Caleen Burgess BROCKS, MD Primary Care Physician: [facility] Admit Date: 02/22/2024  Reason for Consultation/Follow-up: Establishing goals of care  Subjective: Medical records reviewed including progress notes, labs, imaging. Patient assessed at the bedside.  He reports feeling well. No visitors present during my visit.  Created space and opportunity for patient's thoughts and feelings on patient's current illness. He expresses his understanding of the reasoning for his hospitalization (blood pressure problem) but did not recall events leading up to hospitalization. Provided updates on PEA arrest, admission to ICU, and additional hospital course. He tells me he has not yet considered his care preferences and limitations since our initial conversation.   An extensive conversation was had, covering concepts specific to code status, artifical feeding and hydration, continued IV antibiotics and rehospitalization.  I clarified the difference between mechanical ventilation and oxygen  support (patient miscontrued questions about breathing tubes). He would like more time to consider his choices.  Patient shared that his appetite has been low for the past few days, though he ate like a horse prior to admission. Provided education on paths forward including PEG and explored his thoughts and feelings on this. He states that if he couldn't eat enough on his own, he wouldn't have a choice but to proceed with PEG. Counseled that everyone has the choice to consider what would be an acceptable quality of life and either agree or deny certain interventions accordingly. He also shared that he would be open to SNF for  short-term rehab if necessary.  I called patient's son to provide update on the above conversation. He was glad to hear that patient would be open to SNF, as he expected more push back. He plans to continue discussions with patient as well and agreed outpatient palliative care would be helpful.  Questions and concerns addressed. PMT will continue to support holistically.   Length of Stay: 5   Physical Exam Vitals and nursing note reviewed.  Constitutional:      General: He is not in acute distress.    Appearance: He is ill-appearing.  HENT:     Head: Normocephalic and atraumatic.  Cardiovascular:     Rate and Rhythm: Normal rate.  Pulmonary:     Effort: Pulmonary effort is normal.  Neurological:     Mental Status: He is alert.             Vital Signs: BP 104/64 (BP Location: Right Arm)   Pulse 77   Temp 98.7 F (37.1 C) (Oral)   Resp (!) 22   Ht 5' 9 (1.753 m)   Wt 48.2 kg   SpO2 99%   BMI 15.69 kg/m  SpO2: SpO2: 99 % O2 Device: O2 Device: Nasal Cannula O2 Flow Rate: O2 Flow Rate (L/min): 3 L/min      Palliative Assessment/Data: 30% (on tube feeds)   Palliative Care Assessment & Plan   Patient Profile: 63 y.o. male  with past medical history of chronic hypoxic/hypercapnic respiratory failure on home oxygen  due to severe COPD admitted on 02/22/2024 with unresponsiveness  found by home health aide, weeks of increased lethargy, generalized weakness.    In the ED, patient was intubated and had a witnessed PEA cardiac arrest with CPR initiated and ROSC in 9 minutes.  He was admitted to the ICU for acute COPD exacerbation, acute on chronic respiratory failure, severe sepsis/septic shock due to bilateral multifocal pneumonia requiring vasopressor support.   PMT has been consulted to assist with goals of care conversation.  Assessment: Goals of care conversation Status post PEA cardiac arrest Acute on chronic respiratory failure with hypoxia and hypercapnia Severe  protein calorie malnutrition Acute encephalopathy, improving Bilateral multifocal pneumonia Acute COPD exacerbation, improving  Recommendations/Plan: Continue DNR/DNI Continue current care Patient would be agreeable to SNF and potentially PEG if needed Psychosocial and emotional support provided [facility] consulted for outpatient palliative care referral, assistance is appreciated  PMT will continue to follow and support intermittently   Prognosis: Poor long-term prognosis given acute on chronic disease and high risk for further decline/decompensation  Discharge Planning: To Be Determined   Care plan was discussed with patient, patient's son, MD, SLP   MDM: High         Anthony Blakeney SHAUNNA Fell, PA-C  Palliative Medicine Team Team phone # [phone]  Thank you for allowing the Palliative Medicine Team to assist in the care of this patient. Please utilize secure chat with additional questions, if there is no response within 30 minutes please call the above phone number.  Palliative Medicine Team providers are available by phone from 7am to 7pm daily and can be reached through the team cell phone.  Should this patient require assistance outside of these hours, please call the patient's attending physician.

## 2024-02-27 NOTE — Progress Notes (Signed)
 PROGRESS NOTE    Anthony Nelson  FMW:[id number] DOB: 01/26/1961 DOA: 02/22/2024 [facility]CP: [facility]    Brief Narrative:  63 year old cachectic/malnourished, chronic hypoxia with history of COPD, pulmonary HTN, diastolic CHF comes to the ED initially found to be unresponsive by his home health aide.  Due to concerns of unresponsiveness and inability to protect his airway he was intubated.  Upon arrival to the ICU he went into PEA cardiac arrest and ROSC was achieved in 9 minutes.  Following day he was spiking fever and became hypotensive requiring pressors.  He was empirically started on IV steroids and antibiotics due to concerns of severe COPD exacerbation and multifocal pneumonia.   Assessment & Plan:  Principal Problem:   Acute and chronic respiratory failure (acute-on-chronic) (HCC) Active Problems:   Physical deconditioning   Pressure injury of skin   Status post PEA arrest with hypoxia - Required 9 minutes of CPR thereafter ROSC was achieved.  Initially required pressors, now on midodrine   Acute on chronic hypoxic respiratory failure Acute severe COPD exacerbation Septic shock secondary to multifocal pneumonia - Flu, COVID, RSV negative.  Started on IV steroids along with broad-spectrum antibiotics Rocephin  and azithromycin .  Patient is now extubated, initially requiring BiPAP now off and on supplemental oxygen .  Continue aggressive pulmonary toilet and wean off oxygen  as appropriate.  Acute metabolic toxic encephalopathy - Slowly improving.  Limiting sedative medications.  Severe protein calorie malnutrition - Requiring tube feeds which is currently at goal.  Working with dietitian.  Hypomagnesemia/hypokalemia - As needed repletion.  Anemia of chronic disease with macrocytosis - Baseline hemoglobin 9.0.  Continue to monitor.  Bilateral lower extremity blisters on the left - Vancomycin  was added  Chronic diastolic CHF - Overall appears to be stable.  Holding  diuretics  PCCM discussed case with family.  On 8/25, one-way extubation.  Patient is now DNR/DNI.  Palliative care team is following the patient.   DVT prophylaxis: Place and maintain sequential compression device Start: 02/23/24 0209 heparin  injection 5,000 Units Start: 02/22/24 1415      Code Status: Limited: Do not attempt resuscitation (DNR) -DNR-LIMITED -Do Not Intubate/DNI  Family Communication:   Status is: Inpatient Remains inpatient appropriate because: Continue hospital stay as patient still requiring tube feeds   PT Follow up Recs: (Pending Progress; Likely Hhpt)02/25/2024 0800  Subjective: Seen at bedside, poor oral intake.  Overall answering all the basic questions appropriately.   Examination:  General exam: Appears calm and comfortable, cachectic frail Respiratory system: Clear to auscultation. Respiratory effort normal. Cardiovascular system: S1 & S2 heard, RRR. No JVD, murmurs, rubs, gallops or clicks. No pedal edema. Gastrointestinal system: Abdomen is nondistended, soft and nontender. No organomegaly or masses felt. Normal bowel sounds heard. Central nervous system: Alert and oriented. No focal neurological deficits. Extremities: Symmetric 5 x 5 power. Skin: No rashes, lesions or ulcers Psychiatry: Judgement and insight appear normal. Mood & affect appropriate.                Diet Orders (From admission, onward)     Start     Ordered   02/27/24 1032  Diet regular Room service appropriate? Yes with Assist; Fluid consistency: Thin  Diet effective now       Question Answer Comment  Room service appropriate? Yes with Assist   Fluid consistency: Thin      02/27/24 1032            Objective: Vitals:   02/26/24 2259 02/27/24 0309 02/27/24 9682  02/27/24 0713  BP: (!) 98/59 101/75  104/64  Pulse: 83 79  77  Resp: 14 16  (!) 22  Temp: 98.6 F (37 C) 98.1 F (36.7 C)  98.7 F (37.1 C)  TempSrc: Oral Oral  Oral  SpO2: 100% 100%  99%   Weight:   48.2 kg   Height:        Intake/Output Summary (Last 24 hours) at 02/27/2024 1036 Last data filed at 02/27/2024 0800 Gross per 24 hour  Intake 1161.31 ml  Output 1650 ml  Net -488.69 ml   Filed Weights   02/24/24 0708 02/26/24 0500 02/27/24 0317  Weight: 51.7 kg 48.7 kg 48.2 kg    Scheduled Meds:  arformoterol   15 mcg Nebulization BID   Chlorhexidine  Gluconate Cloth  6 each Topical Daily   feeding supplement  237 mL Oral BID BM   heparin   5,000 Units Subcutaneous Q8H   midodrine   10 mg Per Tube Q8H   multivitamin with minerals  1 tablet Oral Daily   predniSONE   40 mg Oral Q breakfast   revefenacin   175 mcg Nebulization Daily   thiamine   100 mg Oral Daily   Continuous Infusions:  azithromycin  Stopped (02/26/24 1658)   cefTRIAXone  (ROCEPHIN )  IV Stopped (02/26/24 1819)   feeding supplement (VITAL AF 1.2 CAL) 1,000 mL (02/27/24 0810)   vancomycin       Nutritional status Signs/Symptoms: severe muscle depletion, severe fat depletion Interventions: Refer to RD note for recommendations Body mass index is 15.69 kg/m.  Data Reviewed:   CBC: Recent Labs  Lab 02/22/24 1145 02/22/24 1552 02/23/24 0349 02/23/24 1554 02/24/24 0627 02/25/24 0146 02/25/24 1206 02/26/24 0547 02/27/24 0603  WBC 9.7   < > 11.4*  --  15.3* 15.1*  --  11.7* 9.6  NEUTROABS 8.9*  --   --   --   --   --   --   --   --   HGB 13.7   < > 11.0*  11.9*   < > 9.8* 9.1* 9.9* 8.9* 9.5*  HCT 46.8   < > 34.8*  35.0*   < > 31.0* 29.0* 29.0* 27.9* 29.7*  MCV 109.1*   < > 101.2*  --  100.6* 100.0  --  100.7* 101.0*  PLT 169   < > 124*  --  100* 96*  --  86* 107*   < > = values in this interval not displayed.   Basic Metabolic Panel: Recent Labs  Lab 02/23/24 0349 02/23/24 1519 02/24/24 0627 02/24/24 0803 02/24/24 1552 02/24/24 1744 02/25/24 0146 02/25/24 1206 02/26/24 0547 02/27/24 0603  NA 142  142   < > 138  --  141  --  138 138 138 139  K 2.8*  2.8*   < > 3.7  --  3.8  --  4.1  3.8 3.6 3.7  CL 90*   < > 100  --  101  --  96*  --  96* 92*  CO2 38*   < > 33*  --  32  --  32  --  37* 41*  GLUCOSE 133*   < > 129*  --  131*  --  126*  --  66* 101*  BUN 39*   < > 22  --  17  --  16  --  16 13  CREATININE 0.90   < > 0.71  --  0.52*  --  0.49*  --  0.56* 0.44*  CALCIUM  9.9   < >  9.0  --  8.5*  --  8.6*  --  8.5* 8.5*  MG 1.8  --   --  1.5*  --   --  1.9  --  1.7 1.8  PHOS 2.5  --   --  3.2  --  3.5  --   --   --   --    < > = values in this interval not displayed.   GFR: Estimated Creatinine Clearance: 63.6 mL/min (A) (by C-G formula based on SCr of 0.44 mg/dL (L)). Liver Function Tests: Recent Labs  Lab 02/22/24 1145 02/23/24 0803  AST 30 215*  ALT 19 292*  ALKPHOS 59 53  BILITOT 0.7 0.7  PROT 7.7 5.8*  ALBUMIN 4.1 3.0*   No results for input(s): LIPASE, AMYLASE in the last 168 hours. Recent Labs  Lab 02/22/24 1146  AMMONIA 47*   Coagulation Profile: Recent Labs  Lab 02/22/24 1145  INR 0.9   Cardiac Enzymes: No results for input(s): CKTOTAL, CKMB, CKMBINDEX, TROPONINI in the last 168 hours. BNP (last 3 results) No results for input(s): PROBNP in the last 8760 hours. HbA1C: No results for input(s): HGBA1C in the last 72 hours. CBG: Recent Labs  Lab 02/26/24 1636 02/26/24 2107 02/26/24 2303 02/27/24 0316 02/27/24 0749  GLUCAP 74 126* 87 91 106*   Lipid Profile: No results for input(s): CHOL, HDL, LDLCALC, TRIG, CHOLHDL, LDLDIRECT in the last 72 hours. Thyroid  Function Tests: No results for input(s): TSH, T4TOTAL, FREET4, T3FREE, THYROIDAB in the last 72 hours. Anemia Panel: No results for input(s): VITAMINB12, FOLATE, FERRITIN, TIBC, IRON, RETICCTPCT in the last 72 hours. Sepsis Labs: Recent Labs  Lab 02/22/24 1209 02/22/24 1607 02/23/24 0815 02/24/24 0721  LATICACIDVEN 1.0 15.0* 2.9* 2.2*    Recent Results (from the past 240 hours)  Culture, blood (Routine X 2) w Reflex to ID  Panel     Status: None (Preliminary result)   Collection Time: 02/22/24 11:50 AM   Specimen: BLOOD  Result Value Ref Range Status   Specimen Description BLOOD LEFT ANTECUBITAL  Final   Special Requests   Final    BOTTLES DRAWN AEROBIC AND ANAEROBIC Blood Culture results may not be optimal due to an inadequate volume of blood received in culture bottles   Culture   Final    NO GROWTH 4 DAYS Performed at [facility], [address], [place], [place] [address]    Report Status PENDING  Incomplete  Culture, blood (Routine X 2) w Reflex to ID Panel     Status: None (Preliminary result)   Collection Time: 02/22/24  3:13 PM   Specimen: BLOOD LEFT ARM  Result Value Ref Range Status   Specimen Description BLOOD LEFT ARM  Final   Special Requests   Final    BOTTLES DRAWN AEROBIC AND ANAEROBIC Blood Culture adequate volume   Culture   Final    NO GROWTH 4 DAYS Performed at [facility], [address], [place], [place] [address]    Report Status PENDING  Incomplete  MRSA Next Gen by PCR, Nasal     Status: None   Collection Time: 02/22/24  4:06 PM   Specimen: Nasal Mucosa; Nasal Swab  Result Value Ref Range Status   MRSA by PCR Next Gen NOT DETECTED NOT DETECTED Final    Comment: (NOTE) The GeneXpert MRSA Assay (FDA approved for NASAL specimens only), is one component of a comprehensive MRSA colonization surveillance program. It is not intended to diagnose  MRSA infection nor to guide or monitor treatment for MRSA infections. Test performance is not FDA approved in patients less than 55 years old. Performed at [facility], [address], [place], [place] [address]   SARS Coronavirus 2 by RT PCR (hospital order, performed in [facility] hospital lab) *cepheid single result test* Anterior Nasal Swab     Status: None   Collection Time: 02/24/24 12:17 PM   Specimen: Anterior Nasal Swab  Result Value Ref Range Status   SARS Coronavirus 2 by RT PCR NEGATIVE NEGATIVE Final     Comment: Performed at [facility], [address], [place], [place] [address]  Respiratory (~20 pathogens) panel by PCR     Status: None   Collection Time: 02/24/24 12:17 PM   Specimen: Nasopharyngeal Swab; Respiratory  Result Value Ref Range Status   Adenovirus NOT DETECTED NOT DETECTED Final   Coronavirus 229E NOT DETECTED NOT DETECTED Final    Comment: (NOTE) The Coronavirus on the Respiratory Panel, DOES NOT test for the novel  Coronavirus (2019 nCoV)    Coronavirus HKU1 NOT DETECTED NOT DETECTED Final   Coronavirus NL63 NOT DETECTED NOT DETECTED Final   Coronavirus OC43 NOT DETECTED NOT DETECTED Final   Metapneumovirus NOT DETECTED NOT DETECTED Final   Rhinovirus / Enterovirus NOT DETECTED NOT DETECTED Final   Influenza A NOT DETECTED NOT DETECTED Final   Influenza B NOT DETECTED NOT DETECTED Final   Parainfluenza Virus 1 NOT DETECTED NOT DETECTED Final   Parainfluenza Virus 2 NOT DETECTED NOT DETECTED Final   Parainfluenza Virus 3 NOT DETECTED NOT DETECTED Final   Parainfluenza Virus 4 NOT DETECTED NOT DETECTED Final   Respiratory Syncytial Virus NOT DETECTED NOT DETECTED Final   Bordetella pertussis NOT DETECTED NOT DETECTED Final   Bordetella Parapertussis NOT DETECTED NOT DETECTED Final   Chlamydophila pneumoniae NOT DETECTED NOT DETECTED Final   Mycoplasma pneumoniae NOT DETECTED NOT DETECTED Final    Comment: Performed at [facility], [address], [place], [place] [address]         Radiology Studies: No results found.         LOS: 5 days   Time spent= 35 mins    Anthony JAYSON Dare, MD [facility]  If 7PM-7AM, please contact night-coverage  02/27/2024, 10:36 AM

## 2024-02-27 NOTE — Progress Notes (Signed)
 Physical Therapy Treatment Patient Details Name: Anthony Nelson MRN: [id number] DOB: Apr 24, 1961 Today's Date: 02/27/2024   History of Present Illness 63 year old male was brought into the emergency department 8/23 after he was found unresponsive by home health aide. Acute on chronic respiratory failure with hypoxia and hypercapnia due to COPD AE. PMH: chronic hypoxic/hypercapnic respiratory failure on home oxygen  due to severe COPD pulmonary hypertension, diastolic heart failure and severe protein calorie malnutrition.    PT Comments  Patient fatigued and willing to work with PT. Moving slowly and requesting rest time between activities. Required CGA for bed mobility, min assist for sit to stand, and min assist +2 for lines/safety for transfer from bed to chair (pt with difficulty maneuvering RW and overshot chair with assist to realign himself prior to sitting. Mild dyspnea with sats down to 82% on 3L and once seated slowly recovered to 90% on 3L. Patient may be more steady on his feet with rollator, to which he is accustomed.    If plan is discharge home, recommend the following: A lot of help with walking and/or transfers;A lot of help with bathing/dressing/bathroom;Assistance with cooking/housework;Assist for transportation;Help with stairs or ramp for entrance   Can travel by private vehicle        Equipment Recommendations  None recommended by PT    Recommendations for Other Services       Precautions / Restrictions Precautions Precautions: Fall Recall of Precautions/Restrictions: Impaired Precaution/Restrictions Comments: monitor O2 Restrictions Weight Bearing Restrictions Per Provider Order: No     Mobility  Bed Mobility Overal bed mobility: Needs Assistance Bed Mobility: Rolling, Sidelying to Sit Rolling: Contact guard assist Sidelying to sit: HOB elevated, Contact guard assist, Used rails       General bed mobility comments: CGA to roll, managed leads/lines.     Transfers Overall transfer level: Needs assistance Equipment used: Rolling walker (2 wheels) Transfers: Sit to/from Stand, Bed to chair/wheelchair/BSC Sit to Stand: Min assist, +2 safety/equipment   Step pivot transfers: Min assist, +2 safety/equipment       General transfer comment: Min assist for boost to stand, cues for technique with RW; pt overshot chair on his rt and required assist to maneuver RW and align himself with chair    Ambulation/Gait               General Gait Details: deferred due to sats down to 82% with standing on 3L O2   Stairs             Wheelchair Mobility     Tilt Bed    Modified Rankin (Stroke Patients Only)       Balance Overall balance assessment: Needs assistance Sitting-balance support: Single extremity supported Sitting balance-Leahy Scale: Poor Sitting balance - Comments: CGA   Standing balance support: Single extremity supported Standing balance-Leahy Scale: Poor                              Communication Communication Communication: Impaired Factors Affecting Communication: Other (comment) (soft voice)  Cognition Arousal: Alert Behavior During Therapy: WFL for tasks assessed/performed   PT - Cognitive impairments: No family/caregiver present to determine baseline, Awareness                       PT - Cognition Comments: Oriented to self, location, year. Following commands: Impaired Following commands impaired: Follows one step commands with increased time, Only follows one step commands consistently  Cueing Cueing Techniques: Verbal cues, Gestural cues  Exercises      General Comments        Pertinent Vitals/Pain Pain Assessment Pain Assessment: No/denies pain Faces Pain Scale: No hurt    Home Living                          Prior Function            PT Goals (current goals can now be found in the care plan section) Acute Rehab PT Goals Patient Stated Goal:  none stated Time For Goal Achievement: 03/10/24 Potential to Achieve Goals: Fair Progress towards PT goals: Progressing toward goals    Frequency    Min 2X/week      PT Plan      Co-evaluation              AM-PAC PT 6 Clicks Mobility   Outcome Measure  Help needed turning from your back to your side while in a flat bed without using bedrails?: A Little Help needed moving from lying on your back to sitting on the side of a flat bed without using bedrails?: A Little Help needed moving to and from a bed to a chair (including a wheelchair)?: A Little Help needed standing up from a chair using your arms (e.g., wheelchair or bedside chair)?: A Little Help needed to walk in hospital room?: A Lot Help needed climbing 3-5 steps with a railing? : Total 6 Click Score: 15    End of Session Equipment Utilized During Treatment: Gait belt;Oxygen  Activity Tolerance: Patient limited by fatigue Patient left: with call bell/phone within reach;in chair;with chair alarm set Nurse Communication: Mobility status;Other (comment) (drop in sats) PT Visit Diagnosis: Unsteadiness on feet (R26.81);Other abnormalities of gait and mobility (R26.89);Muscle weakness (generalized) (M62.81);Difficulty in walking, not elsewhere classified (R26.2)     Time: 8864-8844 PT Time Calculation (min) (ACUTE ONLY): 20 min  Charges:    $Gait Training: 8-22 mins PT General Charges $$ ACUTE PT VISIT: 1 Visit                      Anthony Nelson, PT Acute Rehabilitation Services  Office [phone]    Anthony SHAUNNA Soja 02/27/2024, 1:38 PM

## 2024-02-27 NOTE — Hospital Course (Addendum)
 Brief Narrative:  63 year old cachectic/malnourished, chronic hypoxia with history of COPD, pulmonary HTN, diastolic CHF comes to the ED initially found to be unresponsive by his home health aide.  Due to concerns of unresponsiveness and inability to protect his airway he was intubated.  Upon arrival to the ICU he went into PEA cardiac arrest and ROSC was achieved in 9 minutes.  Following day he was spiking fever and became hypotensive requiring pressors.  He was empirically started on IV steroids and antibiotics due to concerns of severe COPD exacerbation and multifocal pneumonia. During hospitalization completed course of antibiotics and steroids, slowly weaning down on oxygen .  Initially had cortrak in place due to malnourishment requiring tube feeds but eventually patient and family declined long-term feeding tube.  At this time his best is to encourage him with oral intake and provide supplemental nutrition as necessary. Currently medically stable for discharge to SNF.  Assessment & Plan:  Principal Problem:   Acute and chronic respiratory failure (acute-on-chronic) (HCC) Active Problems:   Physical deconditioning   Pressure injury of skin   Status post PEA arrest with hypoxia - Required 9 minutes of CPR thereafter ROSC was achieved.  Initially required pressors, now on midodrine .  Now remains stable, okay to DC cardiac monitor  Acute on chronic hypoxic respiratory failure Acute severe COPD exacerbation Septic shock secondary to multifocal pneumonia - Flu, COVID, RSV negative.  Patient is now improving after extubation and weaned off BiPAP.  Will slowly wean off nasal cannula as well.  Discontinue antibiotics.  Completed 7 days of steroids  Acute metabolic toxic encephalopathy, resolved - Slowly improving.  Limiting sedative medications.  Alkalosis - Somewhat chronic.  Improved with Diamox .  Severe protein calorie malnutrition - Patient is not interested in long-term feeding tube.   Discussed with family as well.  Cortrak removed  Hypomagnesemia/hypokalemia - As needed repletion.  Anemia of chronic disease with macrocytosis - Baseline hemoglobin 9.0.  Continue to monitor.  Bilateral lower extremity blisters on the left Resolved with short course of vancomycin  and also received Rocephin   Chronic diastolic CHF - Overall appears to be stable.  Holding diuretics  PCCM discussed case with family.  On 8/25, one-way extubation.  Patient is now DNR/DNI.  Palliative care team is following the patient.   DVT prophylaxis: Subcu heparin      Code Status: Limited: Do not attempt resuscitation (DNR) -DNR-LIMITED -Do Not Intubate/DNI  Family Communication:   Status is: Inpatient Remains inpatient appropriate because: SNF placement, pending placement  PT Follow up Recs: Skilled Nursing-Short Term Rehab (<3 Hours/Day)02/28/2024 1551  Subjective: Eating his breakfast Examination:  General exam: Appears calm and comfortable, cachectic frail Respiratory system: Clear to auscultation. Respiratory effort normal. Cardiovascular system: S1 & S2 heard, RRR. No JVD, murmurs, rubs, gallops or clicks. No pedal edema. Gastrointestinal system: Abdomen is nondistended, soft and nontender. No organomegaly or masses felt. Normal bowel sounds heard. Central nervous system: Alert and oriented. No focal neurological deficits. Extremities: Symmetric 5 x 5 power. Skin: No rashes, lesions or ulcers Psychiatry: Judgement and insight appear normal. Mood & affect appropriate.

## 2024-02-27 NOTE — Progress Notes (Signed)
 Nutrition Follow-up  DOCUMENTATION CODES:   Underweight, Severe malnutrition in context of chronic illness  INTERVENTION:  Transition to nocturnal tube feeding via Cortrak for 12 hours: Osmolite at 83 ml/h from 1800-0600 (996 ml per day) Prosource TF20 60 ml daily  Provides 1574 kcal, 82 gm protein, 758 ml free water  daily  Encourage PO intake - Currently on Regular diet with thin liquids  Room service with assist  Ensure Plus High Protein po BID, each supplement provides 350 kcal and 20 grams of protein Magic cup TID with meals, each supplement provides 290 kcal and 9 grams of protein 1 packet Juven BID, each packet provides 95 calories, 2.5 grams of protein (collagen), to support wound healing MVI with minerals daily 100 mg Thiamine  daily   NUTRITION DIAGNOSIS:   Severe Malnutrition related to chronic illness as evidenced by severe muscle depletion, severe fat depletion.  GOAL:   Patient will meet greater than or equal to 90% of their needs  MONITOR:   PO intake, Labs, Skin, I & O's, Supplement acceptance  REASON FOR ASSESSMENT:   Consult Enteral/tube feeding initiation and management  ASSESSMENT:  63 yo male admitted with acute on chronic respiratory failure after being found down at home, pt intubated upon arrival to ED, admitted to ICU and then had witnessed PEA arrest. PMH includes severe COPD, chronic respiratory failure on home oxygen , malnutrition, HF.  8/23 Admitted, Intubated, +witness in-hospital PEA arrest  8/25 Cortrak placed, Extubated to BiPap, TF initiated 8/27 - Advanced to Regular diet with thin liquids, transferred to progressive  8/28 - Nocturnal tube feeds started    Pt resing in bed on visit, frail appearing. Pt was able to have diet advanced to regular with thin liquids yesterday. He reports having an appetite. Had 75% of his sausage, eggs,and hash browns this morning. Took pt's lunch order. Pt agreeable to starting Ensures as he was drinking  these PTA. He reports he was only drinking 1 Ensure occasionally. RD encouraged increased PO intake and consumption of 2 Ensures per day. On exam pt is severely malnourished with severe muscle and fat wasting.  Pt tolerating tube feeds at goal. Endorses no nausea, vomiting, abdominal pain. Will transition to nocturnal tube feeds to promote PO intake however pt will likely benefit from long term nutrition support as he was been declining for some time. Palliative following. Recommend discharge with palliative care as OP.   Admit weight: 49.9 kg Current weight: 48.2 kg   Average Meal Intake: No meals documented  Nutritionally Relevant Medications: Scheduled Meds:  feeding supplement  237 mL Oral BID BM   feeding supplement (OSMOLITE 1.5 CAL)  1,000 mL Per Tube Q24H   feeding supplement (PROSource TF20)  60 mL Per Tube Daily   multivitamin with minerals  1 tablet Oral Daily   nutrition supplement (JUVEN)  1 packet Oral BID BM   thiamine   100 mg Oral Daily   Continuous Infusions:  azithromycin  Stopped (02/26/24 1658)   cefTRIAXone  (ROCEPHIN )  IV Stopped (02/26/24 1819)   vancomycin      Labs Reviewed: Creatinine 0.44 Calcium  8.5 CBG ranges from 72-126 mg/dL over the last 24 hours HgbA1c 5.6  NUTRITION - FOCUSED PHYSICAL EXAM:  Flowsheet Row Most Recent Value  Orbital Region Severe depletion  Upper Arm Region Severe depletion  Thoracic and Lumbar Region Severe depletion  Buccal Region Severe depletion  Temple Region Severe depletion  Clavicle Bone Region Severe depletion  Clavicle and Acromion Bone Region Severe depletion  Scapular Bone Region  Severe depletion  Dorsal Hand Severe depletion  Patellar Region Severe depletion  Anterior Thigh Region Severe depletion  Posterior Calf Region Severe depletion  Edema (RD Assessment) None  Eyes Reviewed  Mouth Reviewed  Skin Reviewed  Nails Reviewed    Diet Order:   Diet Order             Diet regular Room service appropriate?  Yes with Assist; Fluid consistency: Thin  Diet effective now                   EDUCATION NEEDS:   Education needs have been addressed  Skin:  Skin Assessment: Skin Integrity Issues: Skin Integrity Issues:: Stage II Stage II: mid sacrum  Last BM:  8/27, type 6  Height:   Ht Readings from Last 1 Encounters:  02/23/24 5' 9 (1.753 m)    Weight:   Wt Readings from Last 1 Encounters:  02/27/24 48.2 kg    Ideal Body Weight:  72.7 kg  BMI:  Body mass index is 15.69 kg/m.  Estimated Nutritional Needs:   Kcal:  1600-1700 kcal  Protein:  70-90 gm  Fluid:  >1.5L/day   Olivia Kenning, RD Registered Dietitian  See Amion for more information

## 2024-02-28 DIAGNOSIS — J9621 Acute and chronic respiratory failure with hypoxia: Secondary | ICD-10-CM | POA: Diagnosis not present

## 2024-02-28 LAB — GLUCOSE, CAPILLARY
Glucose-Capillary: 128 mg/dL — ABNORMAL HIGH (ref 70–99)
Glucose-Capillary: 133 mg/dL — ABNORMAL HIGH (ref 70–99)
Glucose-Capillary: 140 mg/dL — ABNORMAL HIGH (ref 70–99)
Glucose-Capillary: 147 mg/dL — ABNORMAL HIGH (ref 70–99)
Glucose-Capillary: 160 mg/dL — ABNORMAL HIGH (ref 70–99)
Glucose-Capillary: 95 mg/dL (ref 70–99)

## 2024-02-28 LAB — CBC
HCT: 28.5 % — ABNORMAL LOW (ref 39.0–52.0)
Hemoglobin: 9 g/dL — ABNORMAL LOW (ref 13.0–17.0)
MCH: 31.7 pg (ref 26.0–34.0)
MCHC: 31.6 g/dL (ref 30.0–36.0)
MCV: 100.4 fL — ABNORMAL HIGH (ref 80.0–100.0)
Platelets: 120 K/uL — ABNORMAL LOW (ref 150–400)
RBC: 2.84 MIL/uL — ABNORMAL LOW (ref 4.22–5.81)
RDW: 12.1 % (ref 11.5–15.5)
WBC: 8.6 K/uL (ref 4.0–10.5)
nRBC: 0 % (ref 0.0–0.2)

## 2024-02-28 LAB — CULTURE, BLOOD (ROUTINE X 2)
Culture: NO GROWTH
Culture: NO GROWTH
Special Requests: ADEQUATE

## 2024-02-28 LAB — BASIC METABOLIC PANEL WITH GFR
BUN: 11 mg/dL (ref 8–23)
CO2: >45 mmol/L — ABNORMAL HIGH (ref 22–32)
Calcium: 8.5 mg/dL — ABNORMAL LOW (ref 8.9–10.3)
Chloride: 88 mmol/L — ABNORMAL LOW (ref 98–111)
Creatinine, Ser: 0.42 mg/dL — ABNORMAL LOW (ref 0.61–1.24)
GFR, Estimated: >60 mL/min (ref >60)
Glucose, Bld: 151 mg/dL — ABNORMAL HIGH (ref 70–99)
Potassium: 4 mmol/L (ref 3.5–5.1)
Sodium: 141 mmol/L (ref 135–145)

## 2024-02-28 LAB — PHOSPHORUS: Phosphorus: 3.9 mg/dL (ref 2.5–4.6)

## 2024-02-28 LAB — MAGNESIUM: Magnesium: 1.8 mg/dL (ref 1.7–2.4)

## 2024-02-28 MED ORDER — AMOXICILLIN-POT CLAVULANATE 875-125 MG PO TABS
1.0000 | ORAL_TABLET | Freq: Two times a day (BID) | ORAL | Status: DC
  Administered 2024-02-29 – 2024-03-01 (×3): 1 via ORAL
  Filled 2024-02-28 (×3): qty 1

## 2024-02-28 MED ORDER — ACETAZOLAMIDE 250 MG PO TABS
250.0000 mg | ORAL_TABLET | Freq: Two times a day (BID) | ORAL | Status: AC
  Administered 2024-02-28 (×2): 250 mg via ORAL
  Filled 2024-02-28 (×2): qty 1

## 2024-02-28 NOTE — Progress Notes (Signed)
 Occupational Therapy Treatment Patient Details Name: Anthony Nelson MRN: [id number] DOB: 09/11/1960 Today's Date: 02/28/2024   History of present illness 63 year old male was brought into the emergency department 8/23 after he was found unresponsive by home health aide. Acute on chronic respiratory failure with hypoxia and hypercapnia due to COPD AE. PMH: chronic hypoxic/hypercapnic respiratory failure on home oxygen  due to severe COPD pulmonary hypertension, diastolic heart failure and severe protein calorie malnutrition.   OT comments  Patient admitted for the diagnosis above.  PTA patient lived at home, unsure regarding assist at home, or who lives with him.  Patient did state that [facility] OT and PT were seeing him.  Currently he presents as lethargic, needing Min A for basic mobility and up to Mod A for lower body ADL.  OT will continue efforts in the acute setting to address deficits and Patient will benefit from continued inpatient follow up therapy, <3 hours/day.  Patient would prefer to return home and resume HH, but that will be progress dependent, and level of assist at home.        If plan is discharge home, recommend the following:  A little help with walking and/or transfers;A lot of help with bathing/dressing/bathroom;Assist for transportation;Assistance with cooking/housework;Help with stairs or ramp for entrance   Equipment Recommendations  None recommended by OT    Recommendations for Other Services      Precautions / Restrictions Precautions Precautions: Fall Recall of Precautions/Restrictions: Impaired Precaution/Restrictions Comments: monitor O2 Restrictions Weight Bearing Restrictions Per Provider Order: No       Mobility Bed Mobility Overal bed mobility: Needs Assistance Bed Mobility: Sidelying to Sit   Sidelying to sit: Contact guard assist, Min assist         Patient Response: Cooperative  Transfers Overall transfer level: Needs assistance Equipment used:  Rolling walker (2 wheels) Transfers: Sit to/from Stand, Bed to chair/wheelchair/BSC Sit to Stand: Min assist     Step pivot transfers: Min assist           Balance Overall balance assessment: Needs assistance Sitting-balance support: Single extremity supported Sitting balance-Leahy Scale: Poor   Postural control: Right lateral lean Standing balance support: Reliant on assistive device for balance Standing balance-Leahy Scale: Poor                             ADL either performed or assessed with clinical judgement   ADL Overall ADL's : Needs assistance/impaired Eating/Feeding: Set up;Sitting   Grooming: Wash/dry hands;Wash/dry face;Set up;Sitting   Upper Body Bathing: Minimal assistance;Sitting   Lower Body Bathing: Moderate assistance;Sit to/from stand   Upper Body Dressing : Minimal assistance;Sitting   Lower Body Dressing: Moderate assistance;Sit to/from stand   Toilet Transfer: Minimal assistance;Rolling walker (2 wheels)                  Extremity/Trunk Assessment Upper Extremity Assessment Upper Extremity Assessment: Generalized weakness   Lower Extremity Assessment Lower Extremity Assessment: Defer to PT evaluation   Cervical / Trunk Assessment Cervical / Trunk Assessment: Kyphotic    Vision   Vision Assessment?: No apparent visual deficits   Perception Perception Perception: Not tested   Praxis Praxis Praxis: Not tested   Communication Communication Communication: No apparent difficulties   Cognition Arousal: Lethargic Behavior During Therapy: WFL for tasks assessed/performed Cognition: Difficult to assess Difficult to assess due to: Level of arousal  Following commands: Impaired Following commands impaired: Follows one step commands with increased time      Cueing   Cueing Techniques: Verbal cues, Gestural cues  Exercises      Shoulder Instructions       General Comments       Pertinent Vitals/ Pain       Pain Assessment Pain Assessment: No/denies pain Pain Intervention(s): Monitored during session  Home Living Family/patient expects to be discharged to:: Private residence Living Arrangements: Spouse/significant other Available Help at Discharge: Family;Available PRN/intermittently Type of Home: Apartment Home Access: Level entry     Home Layout: One level     Bathroom Shower/Tub: Producer, television/film/video: Standard Bathroom Accessibility: Yes   Home Equipment: Grab bars - tub/shower;Rollator (4 wheels);Electric scooter   Additional Comments: home concentrator of O2 * Info from prior admisson (pt with bipap unable to understand responses.)      Prior Functioning/Environment              Frequency  Min 2X/week        Progress Toward Goals  OT Goals(current goals can now be found in the care plan section)  Progress towards OT goals: Progressing toward goals     Plan      Co-evaluation                 AM-PAC OT 6 Clicks Daily Activity     Outcome Measure   Help from another person eating meals?: A Little Help from another person taking care of personal grooming?: A Little Help from another person toileting, which includes using toliet, bedpan, or urinal?: A Lot Help from another person bathing (including washing, rinsing, drying)?: A Lot Help from another person to put on and taking off regular upper body clothing?: A Little Help from another person to put on and taking off regular lower body clothing?: A Lot 6 Click Score: 15    End of Session Equipment Utilized During Treatment: Rolling walker (2 wheels);Oxygen   OT Visit Diagnosis: Unsteadiness on feet (R26.81);Muscle weakness (generalized) (M62.81)   Activity Tolerance Patient limited by lethargy   Patient Left in chair;with call bell/phone within reach;with chair alarm set   Nurse Communication Mobility status        Time: 9141-9082 OT Time  Calculation (min): 19 min  Charges: OT General Charges $OT Visit: 1 Visit OT Treatments $Self Care/Home Management : 8-22 mins  02/28/2024  RP, OTR/L  Acute Rehabilitation Services  Office:  [phone]   Charlie JONETTA Halsted 02/28/2024, 9:25 AM

## 2024-02-28 NOTE — Progress Notes (Addendum)
 Pt appears to be lethargic but arousable to voice, pt dozing off during conversation with RN. Orientedx3. Dr. Caleen notified. No new orders received. VSS.

## 2024-02-28 NOTE — Progress Notes (Signed)
 PROGRESS NOTE    Anthony Nelson  FMW:[id number] DOB: [birth year] DOA: 02/22/2024 PCP: [facility]    Brief Narrative:  63 year old cachectic/malnourished, chronic hypoxia with history of COPD, pulmonary HTN, diastolic CHF comes to the ED initially found to be unresponsive by his home health aide.  Due to concerns of unresponsiveness and inability to protect his airway he was intubated.  Upon arrival to the ICU he went into PEA cardiac arrest and ROSC was achieved in 9 minutes.  Following day he was spiking fever and became hypotensive requiring pressors.  He was empirically started on IV steroids and antibiotics due to concerns of severe COPD exacerbation and multifocal pneumonia.   Assessment & Plan:  Principal Problem:   Acute and chronic respiratory failure (acute-on-chronic) (HCC) Active Problems:   Physical deconditioning   Pressure injury of skin   Status post PEA arrest with hypoxia - Required 9 minutes of CPR thereafter ROSC was achieved.  Initially required pressors, now on midodrine   Acute on chronic hypoxic respiratory failure Acute severe COPD exacerbation Septic shock secondary to multifocal pneumonia - Flu, COVID, RSV negative.  Started on IV steroids along with broad-spectrum antibiotics Rocephin  and azithromycin .  Patient is now extubated, initially requiring BiPAP now off and on supplemental oxygen .  Continue aggressive pulmonary toilet and wean off oxygen  as appropriate.  Acute metabolic toxic encephalopathy - Slowly improving.  Limiting sedative medications.  Alkalosis - Rising bicarb >.  Will give Diamox   Severe protein calorie malnutrition - Requiring tube feeds which is currently at goal.  Working with dietitian.  Patient has declined long term feeding tube to my and the family.   Hypomagnesemia/hypokalemia - As needed repletion.  Anemia of chronic disease with macrocytosis - Baseline hemoglobin 9.0.  Continue to monitor.  Bilateral lower  extremity blisters on the left - Vancomycin  was added  Chronic diastolic CHF - Overall appears to be stable.  Holding diuretics  PCCM discussed case with family.  On 8/25, one-way extubation.  Patient is now DNR/DNI.  Palliative care team is following the patient.   DVT prophylaxis: Place and maintain sequential compression device Start: 02/23/24 0209 heparin  injection 5,000 Units Start: 02/22/24 1415      Code Status: Limited: Do not attempt resuscitation (DNR) -DNR-LIMITED -Do Not Intubate/DNI  Family Communication:   Status is: Inpatient Remains inpatient appropriate because: Continue hospital stay as patient still requiring tube feeds   PT Follow up Recs: (Pending Progress; Likely Hhpt)02/27/2024 1204  Subjective: Sitting up in the recliner.  Declined Peg tube at this time.  Daughter updated.   Examination:  General exam: Appears calm and comfortable, cachectic frail Respiratory system: Clear to auscultation. Respiratory effort normal. Cardiovascular system: S1 & S2 heard, RRR. No JVD, murmurs, rubs, gallops or clicks. No pedal edema. Gastrointestinal system: Abdomen is nondistended, soft and nontender. No organomegaly or masses felt. Normal bowel sounds heard. Central nervous system: Alert and oriented. No focal neurological deficits. Extremities: Symmetric 5 x 5 power. Skin: No rashes, lesions or ulcers Psychiatry: Judgement and insight appear normal. Mood & affect appropriate.                Diet Orders (From admission, onward)     Start     Ordered   02/27/24 1032  Diet regular Room service appropriate? Yes with Assist; Fluid consistency: Thin  Diet effective now       Question Answer Comment  Room service appropriate? Yes with Assist   Fluid consistency: Thin  02/27/24 1032            Objective: Vitals:   02/27/24 1900 02/27/24 2342 02/28/24 0344 02/28/24 0800  BP: 97/65 110/71 103/64 104/63  Pulse: 86 81 74 79  Resp: 19 20 15 14    Temp:  98.1 F (36.7 C) 98.7 F (37.1 C) 98.6 F (37 C)  TempSrc:  Oral Axillary Oral  SpO2: 100% 100% 100% 100%  Weight:   49.1 kg   Height:        Intake/Output Summary (Last 24 hours) at 02/28/2024 1100 Last data filed at 02/28/2024 0549 Gross per 24 hour  Intake 1791.92 ml  Output 2325 ml  Net -533.08 ml   Filed Weights   02/26/24 0500 02/27/24 0317 02/28/24 0344  Weight: 48.7 kg 48.2 kg 49.1 kg    Scheduled Meds:  acetaZOLAMIDE   250 mg Oral BID   arformoterol   15 mcg Nebulization BID   aspirin  EC  81 mg Oral Daily   feeding supplement  237 mL Oral BID BM   feeding supplement (OSMOLITE 1.5 CAL)  1,000 mL Per Tube Q24H   feeding supplement (PROSource TF20)  60 mL Per Tube Daily   heparin   5,000 Units Subcutaneous Q8H   midodrine   10 mg Per Tube Q8H   multivitamin with minerals  1 tablet Oral Daily   nutrition supplement (JUVEN)  1 packet Oral BID BM   predniSONE   40 mg Oral Q breakfast   revefenacin   175 mcg Nebulization Daily   thiamine   100 mg Oral Daily   Continuous Infusions:  azithromycin  250 mL/hr at 02/27/24 1500   cefTRIAXone  (ROCEPHIN )  IV Stopped (02/27/24 1428)   vancomycin  250 mL/hr at 02/27/24 1500    Nutritional status Signs/Symptoms: severe muscle depletion, severe fat depletion Interventions: Refer to RD note for recommendations Body mass index is 15.99 kg/m.  Data Reviewed:   CBC: Recent Labs  Lab 02/22/24 1145 02/22/24 1552 02/24/24 0627 02/25/24 0146 02/25/24 1206 02/26/24 0547 02/27/24 0603 02/28/24 0406  WBC 9.7   < > 15.3* 15.1*  --  11.7* 9.6 8.6  NEUTROABS 8.9*  --   --   --   --   --   --   --   HGB 13.7   < > 9.8* 9.1* 9.9* 8.9* 9.5* 9.0*  HCT 46.8   < > 31.0* 29.0* 29.0* 27.9* 29.7* 28.5*  MCV 109.1*   < > 100.6* 100.0  --  100.7* 101.0* 100.4*  PLT 169   < > 100* 96*  --  86* 107* 120*   < > = values in this interval not displayed.   Basic Metabolic Panel: Recent Labs  Lab 02/23/24 0349 02/23/24 1519  02/24/24 0803 02/24/24 1552 02/24/24 1744 02/25/24 0146 02/25/24 1206 02/26/24 0547 02/27/24 0603 02/28/24 0406  NA 142  142   < >  --  141  --  138 138 138 139 141  K 2.8*  2.8*   < >  --  3.8  --  4.1 3.8 3.6 3.7 4.0  CL 90*   < >  --  101  --  96*  --  96* 92* 88*  CO2 38*   < >  --  32  --  32  --  37* 41* >45*  GLUCOSE 133*   < >  --  131*  --  126*  --  66* 101* 151*  BUN 39*   < >  --  17  --  16  --  16 13 11   CREATININE 0.90   < >  --  0.52*  --  0.49*  --  0.56* 0.44* 0.42*  CALCIUM  9.9   < >  --  8.5*  --  8.6*  --  8.5* 8.5* 8.5*  MG 1.8  --  1.5*  --   --  1.9  --  1.7 1.8 1.8  PHOS 2.5  --  3.2  --  3.5  --   --   --   --  3.9   < > = values in this interval not displayed.   GFR: Estimated Creatinine Clearance: 64.8 mL/min (A) (by C-G formula based on SCr of 0.42 mg/dL (L)). Liver Function Tests: Recent Labs  Lab 02/22/24 1145 02/23/24 0803  AST 30 215*  ALT 19 292*  ALKPHOS 59 53  BILITOT 0.7 0.7  PROT 7.7 5.8*  ALBUMIN 4.1 3.0*   No results for input(s): LIPASE, AMYLASE in the last 168 hours. Recent Labs  Lab 02/22/24 1146  AMMONIA 47*   Coagulation Profile: Recent Labs  Lab 02/22/24 1145  INR 0.9   Cardiac Enzymes: No results for input(s): CKTOTAL, CKMB, CKMBINDEX, TROPONINI in the last 168 hours. BNP (last 3 results) No results for input(s): PROBNP in the last 8760 hours. HbA1C: No results for input(s): HGBA1C in the last 72 hours. CBG: Recent Labs  Lab 02/27/24 1648 02/27/24 2027 02/27/24 2341 02/28/24 0345 02/28/24 0808  GLUCAP 119* 196* 138* 147* 160*   Lipid Profile: No results for input(s): CHOL, HDL, LDLCALC, TRIG, CHOLHDL, LDLDIRECT in the last 72 hours. Thyroid  Function Tests: No results for input(s): TSH, T4TOTAL, FREET4, T3FREE, THYROIDAB in the last 72 hours. Anemia Panel: No results for input(s): VITAMINB12, FOLATE, FERRITIN, TIBC, IRON, RETICCTPCT in the last 72  hours. Sepsis Labs: Recent Labs  Lab 02/22/24 1209 02/22/24 1607 02/23/24 0815 02/24/24 0721  LATICACIDVEN 1.0 15.0* 2.9* 2.2*    Recent Results (from the past 240 hours)  Culture, blood (Routine X 2) w Reflex to ID Panel     Status: None   Collection Time: 02/22/24 11:50 AM   Specimen: BLOOD  Result Value Ref Range Status   Specimen Description BLOOD LEFT ANTECUBITAL  Final   Special Requests   Final    BOTTLES DRAWN AEROBIC AND ANAEROBIC Blood Culture results may not be optimal due to an inadequate volume of blood received in culture bottles   Culture   Final    NO GROWTH 6 DAYS Performed at [facility], [address], [place], [place] [address]    Report Status 02/28/2024 FINAL  Final  Culture, blood (Routine X 2) w Reflex to ID Panel     Status: None   Collection Time: 02/22/24  3:13 PM   Specimen: BLOOD LEFT ARM  Result Value Ref Range Status   Specimen Description BLOOD LEFT ARM  Final   Special Requests   Final    BOTTLES DRAWN AEROBIC AND ANAEROBIC Blood Culture adequate volume   Culture   Final    NO GROWTH 6 DAYS Performed at [facility], [address] [place], [place] [address]    Report Status 02/28/2024 FINAL  Final  MRSA Next Gen by PCR, Nasal     Status: None   Collection Time: 02/22/24  4:06 PM   Specimen: Nasal Mucosa; Nasal Swab  Result Value Ref Range Status   MRSA by PCR Next Gen NOT DETECTED NOT DETECTED Final    Comment: (NOTE) The  GeneXpert MRSA Assay (FDA approved for NASAL specimens only), is one component of a comprehensive MRSA colonization surveillance program. It is not intended to diagnose MRSA infection nor to guide or monitor treatment for MRSA infections. Test performance is not FDA approved in patients less than 44 years old. Performed at [facility], [address], [place], [place] [address]   SARS Coronavirus 2 by RT PCR (hospital order, performed in [facility] hospital lab) *cepheid single result test*  Anterior Nasal Swab     Status: None   Collection Time: 02/24/24 12:17 PM   Specimen: Anterior Nasal Swab  Result Value Ref Range Status   SARS Coronavirus 2 by RT PCR NEGATIVE NEGATIVE Final    Comment: Performed at [facility], [address], [place], [place] [address]  Respiratory (~20 pathogens) panel by PCR     Status: None   Collection Time: 02/24/24 12:17 PM   Specimen: Nasopharyngeal Swab; Respiratory  Result Value Ref Range Status   Adenovirus NOT DETECTED NOT DETECTED Final   Coronavirus 229E NOT DETECTED NOT DETECTED Final    Comment: (NOTE) The Coronavirus on the Respiratory Panel, DOES NOT test for the novel  Coronavirus (2019 nCoV)    Coronavirus HKU1 NOT DETECTED NOT DETECTED Final   Coronavirus NL63 NOT DETECTED NOT DETECTED Final   Coronavirus OC43 NOT DETECTED NOT DETECTED Final   Metapneumovirus NOT DETECTED NOT DETECTED Final   Rhinovirus / Enterovirus NOT DETECTED NOT DETECTED Final   Influenza A NOT DETECTED NOT DETECTED Final   Influenza B NOT DETECTED NOT DETECTED Final   Parainfluenza Virus 1 NOT DETECTED NOT DETECTED Final   Parainfluenza Virus 2 NOT DETECTED NOT DETECTED Final   Parainfluenza Virus 3 NOT DETECTED NOT DETECTED Final   Parainfluenza Virus 4 NOT DETECTED NOT DETECTED Final   Respiratory Syncytial Virus NOT DETECTED NOT DETECTED Final   Bordetella pertussis NOT DETECTED NOT DETECTED Final   Bordetella Parapertussis NOT DETECTED NOT DETECTED Final   Chlamydophila pneumoniae NOT DETECTED NOT DETECTED Final   Mycoplasma pneumoniae NOT DETECTED NOT DETECTED Final    Comment: Performed at [facility], [address] [place], [place] [address]         Radiology Studies: No results found.         LOS: 6 days   Time spent= 35 mins    Anthony JAYSON Dare, MD [facility]  If 7PM-7AM, please contact night-coverage  02/28/2024, 11:00 AM

## 2024-02-28 NOTE — Progress Notes (Signed)
 Physical Therapy Treatment Patient Details Name: KYUSS HALE MRN: [id number] DOB: 1960/10/12 Today's Date: 02/28/2024   History of Present Illness 63 year old male was brought into the emergency department 8/23 after he was found unresponsive by home health aide. Acute on chronic respiratory failure with hypoxia and hypercapnia due to COPD AE. PMH: chronic hypoxic/hypercapnic respiratory failure on home oxygen  due to severe COPD pulmonary hypertension, diastolic heart failure and severe protein calorie malnutrition.    PT Comments  Pt sleeping in chair upon PT arrival to room, follows commands and does interact but limited attention before dozing off again. Pt tolerating transfer from recliner to bed, overall requiring light steadying and lines/leads management. Pt very limited by low arousal this date, RN notified. PT to continue to follow.      If plan is discharge home, recommend the following: A lot of help with walking and/or transfers;A lot of help with bathing/dressing/bathroom;Assistance with cooking/housework;Assist for transportation;Help with stairs or ramp for entrance   Can travel by private vehicle        Equipment Recommendations  None recommended by PT    Recommendations for Other Services       Precautions / Restrictions Precautions Precautions: Fall Recall of Precautions/Restrictions: Impaired Precaution/Restrictions Comments: monitor , on 3LO2 Restrictions Weight Bearing Restrictions Per Provider Order: No     Mobility  Bed Mobility Overal bed mobility: Needs Assistance Bed Mobility: Sit to Sidelying         Sit to sidelying: Min assist, HOB elevated, Used rails General bed mobility comments: up in chair upon PT arrival to room, light assist for LE elevation    Transfers Overall transfer level: Needs assistance Equipment used: Rollator (4 wheels) Transfers: Sit to/from Stand Sit to Stand: Min assist   Step pivot transfers: Min assist        General transfer comment: assist for power up, rise, steadying, and pivot to bed from recliner towards L. Assist for lines, guiding hips to EOB.    Ambulation/Gait               General Gait Details: nt   Comptroller Bed    Modified Rankin (Stroke Patients Only)       Balance Overall balance assessment: Needs assistance Sitting-balance support: Single extremity supported Sitting balance-Leahy Scale: Fair     Standing balance support: Reliant on assistive device for balance, Bilateral upper extremity supported Standing balance-Leahy Scale: Poor                              Communication Communication Communication: No apparent difficulties  Cognition Arousal: Lethargic Behavior During Therapy: WFL for tasks assessed/performed   PT - Cognitive impairments: No family/caregiver present to determine baseline, Awareness, Orientation, Safety/Judgement, Attention, Problem solving   Orientation impairments: Situation                   PT - Cognition Comments: A&Ox3. very lethargic with periods of eye closing and sleeping during session, wakes easily but very short attention span Following commands: Impaired Following commands impaired: Follows one step commands with increased time    Cueing Cueing Techniques: Verbal cues, Gestural cues  Exercises      General Comments        Pertinent Vitals/Pain Pain Assessment Pain Assessment: No/denies pain Pain Intervention(s): Monitored during session    Home Living  Prior Function            PT Goals (current goals can now be found in the care plan section) Acute Rehab PT Goals Patient Stated Goal: none stated PT Goal Formulation: With patient Time For Goal Achievement: 03/10/24 Potential to Achieve Goals: Fair Progress towards PT goals: Not progressing toward goals - comment (lethargy, limited tolerance)     Frequency    Min 2X/week      PT Plan      Co-evaluation              AM-PAC PT 6 Clicks Mobility   Outcome Measure  Help needed turning from your back to your side while in a flat bed without using bedrails?: A Little Help needed moving from lying on your back to sitting on the side of a flat bed without using bedrails?: A Little Help needed moving to and from a bed to a chair (including a wheelchair)?: A Little Help needed standing up from a chair using your arms (e.g., wheelchair or bedside chair)?: A Little Help needed to walk in hospital room?: A Lot Help needed climbing 3-5 steps with a railing? : Total 6 Click Score: 15    End of Session Equipment Utilized During Treatment: Oxygen  Activity Tolerance: Patient limited by fatigue Patient left: with call bell/phone within reach;in bed;with bed alarm set Nurse Communication: Mobility status;Other (comment) (lethargy) PT Visit Diagnosis: Unsteadiness on feet (R26.81);Muscle weakness (generalized) (M62.81);Difficulty in walking, not elsewhere classified (R26.2)     Time: 1436-1450 PT Time Calculation (min) (ACUTE ONLY): 14 min  Charges:    $Therapeutic Activity: 8-22 mins PT General Charges $$ ACUTE PT VISIT: 1 Visit                     Johana RAMAN, PT DPT Acute Rehabilitation Services Secure Chat Preferred  Office [phone]    Jakorey Mcconathy [place] 02/28/2024, 3:53 PM

## 2024-02-29 DIAGNOSIS — J9621 Acute and chronic respiratory failure with hypoxia: Secondary | ICD-10-CM | POA: Diagnosis not present

## 2024-02-29 LAB — BASIC METABOLIC PANEL WITH GFR
Anion gap: 8 (ref 5–15)
BUN: 24 mg/dL — ABNORMAL HIGH (ref 8–23)
CO2: 41 mmol/L — ABNORMAL HIGH (ref 22–32)
Calcium: 9 mg/dL (ref 8.9–10.3)
Chloride: 90 mmol/L — ABNORMAL LOW (ref 98–111)
Creatinine, Ser: 0.42 mg/dL — ABNORMAL LOW (ref 0.61–1.24)
GFR, Estimated: >60 mL/min (ref >60)
Glucose, Bld: 136 mg/dL — ABNORMAL HIGH (ref 70–99)
Potassium: 4 mmol/L (ref 3.5–5.1)
Sodium: 139 mmol/L (ref 135–145)

## 2024-02-29 LAB — CBC
HCT: 31.1 % — ABNORMAL LOW (ref 39.0–52.0)
Hemoglobin: 9.6 g/dL — ABNORMAL LOW (ref 13.0–17.0)
MCH: 31.9 pg (ref 26.0–34.0)
MCHC: 30.9 g/dL (ref 30.0–36.0)
MCV: 103.3 fL — ABNORMAL HIGH (ref 80.0–100.0)
Platelets: 173 K/uL (ref 150–400)
RBC: 3.01 MIL/uL — ABNORMAL LOW (ref 4.22–5.81)
RDW: 12.1 % (ref 11.5–15.5)
WBC: 9.1 K/uL (ref 4.0–10.5)
nRBC: 0 % (ref 0.0–0.2)

## 2024-02-29 LAB — GLUCOSE, CAPILLARY
Glucose-Capillary: 159 mg/dL — ABNORMAL HIGH (ref 70–99)
Glucose-Capillary: 103 mg/dL — ABNORMAL HIGH (ref 70–99)
Glucose-Capillary: 96 mg/dL (ref 70–99)
Glucose-Capillary: 132 mg/dL — ABNORMAL HIGH (ref 70–99)
Glucose-Capillary: 88 mg/dL (ref 70–99)

## 2024-02-29 LAB — MAGNESIUM: Magnesium: 1.6 mg/dL — ABNORMAL LOW (ref 1.7–2.4)

## 2024-02-29 MED ORDER — MAGNESIUM SULFATE 4 GM/100ML IV SOLN
4.0000 g | Freq: Once | INTRAVENOUS | Status: AC
  Administered 2024-02-29: 4 g via INTRAVENOUS
  Filled 2024-02-29: qty 100

## 2024-02-29 NOTE — Progress Notes (Signed)
 PROGRESS NOTE    Anthony Nelson  FMW:[id number] DOB: Feb 14, 1961 DOA: 02/22/2024 PCP: [facility]    Brief Narrative:  63 year old cachectic/malnourished, chronic hypoxia with history of COPD, pulmonary HTN, diastolic CHF comes to the ED initially found to be unresponsive by his home health aide.  Due to concerns of unresponsiveness and inability to protect his airway he was intubated.  Upon arrival to the ICU he went into PEA cardiac arrest and ROSC was achieved in 9 minutes.  Following day he was spiking fever and became hypotensive requiring pressors.  He was empirically started on IV steroids and antibiotics due to concerns of severe COPD exacerbation and multifocal pneumonia.   Assessment & Plan:  Principal Problem:   Acute and chronic respiratory failure (acute-on-chronic) (HCC) Active Problems:   Physical deconditioning   Pressure injury of skin   Status post PEA arrest with hypoxia - Required 9 minutes of CPR thereafter ROSC was achieved.  Initially required pressors, now on midodrine .  Now remains stable, okay to DC cardiac monitor  Acute on chronic hypoxic respiratory failure Acute severe COPD exacerbation Septic shock secondary to multifocal pneumonia - Flu, COVID, RSV negative.  Patient is now improving after extubation and weaned off BiPAP.  Will slowly wean off nasal cannula as well.  Transitioned antibiotics to p.o. Augmentin .  Finished 7 days of steroids  Acute metabolic toxic encephalopathy, resolved - Slowly improving.  Limiting sedative medications.  Alkalosis - Somewhat chronic.  Improved with Diamox .  Severe protein calorie malnutrition - Patient is not interested in long-term feeding tube.  Discussed with family as well.  Will discontinue cortrak  Hypomagnesemia/hypokalemia - As needed repletion.  Anemia of chronic disease with macrocytosis - Baseline hemoglobin 9.0.  Continue to monitor.  Bilateral lower extremity blisters on the left -  Vancomycin  was added  Chronic diastolic CHF - Overall appears to be stable.  Holding diuretics  PCCM discussed case with family.  On 8/25, one-way extubation.  Patient is now DNR/DNI.  Palliative care team is following the patient.   DVT prophylaxis: Subcu heparin      Code Status: Limited: Do not attempt resuscitation (DNR) -DNR-LIMITED -Do Not Intubate/DNI  Family Communication:   Status is: Inpatient Remains inpatient appropriate because: SNF placement  PT Follow up Recs: Skilled Nursing-Short Term Rehab (<3 Hours/Day)02/28/2024 1551  Subjective: Seen at bedside.  Poor oral intake.  Still declines any kind of long-term feeding tube.  Examination:  General exam: Appears calm and comfortable, cachectic frail Respiratory system: Clear to auscultation. Respiratory effort normal. Cardiovascular system: S1 & S2 heard, RRR. No JVD, murmurs, rubs, gallops or clicks. No pedal edema. Gastrointestinal system: Abdomen is nondistended, soft and nontender. No organomegaly or masses felt. Normal bowel sounds heard. Central nervous system: Alert and oriented. No focal neurological deficits. Extremities: Symmetric 5 x 5 power. Skin: No rashes, lesions or ulcers Psychiatry: Judgement and insight appear normal. Mood & affect appropriate.                Diet Orders (From admission, onward)     Start     Ordered   02/27/24 1032  Diet regular Room service appropriate? Yes with Assist; Fluid consistency: Thin  Diet effective now       Question Answer Comment  Room service appropriate? Yes with Assist   Fluid consistency: Thin      02/27/24 1032            Objective: Vitals:   02/29/24 0305 02/29/24 0400 02/29/24 0419  02/29/24 0752  BP: 98/60 98/60  99/68  Pulse: (!) 107 (!) 103 98 83  Resp: 16 13 20 18   Temp: 98 F (36.7 C) 98 F (36.7 C)  98 F (36.7 C)  TempSrc: Axillary   Oral  SpO2: 95% 99% 99% 98%  Weight:      Height:        Intake/Output Summary (Last 24  hours) at 02/29/2024 0953 Last data filed at 02/29/2024 0616 Gross per 24 hour  Intake 1143.55 ml  Output 3300 ml  Net -2156.45 ml   Filed Weights   02/27/24 0317 02/28/24 0344 02/29/24 0120  Weight: 48.2 kg 49.1 kg 49 kg    Scheduled Meds:  amoxicillin -clavulanate  1 tablet Oral Q12H   arformoterol   15 mcg Nebulization BID   aspirin  EC  81 mg Oral Daily   feeding supplement  237 mL Oral BID BM   feeding supplement (OSMOLITE 1.5 CAL)  1,000 mL Per Tube Q24H   feeding supplement (PROSource TF20)  60 mL Per Tube Daily   heparin   5,000 Units Subcutaneous Q8H   midodrine   10 mg Per Tube Q8H   multivitamin with minerals  1 tablet Oral Daily   nutrition supplement (JUVEN)  1 packet Oral BID BM   revefenacin   175 mcg Nebulization Daily   thiamine   100 mg Oral Daily   Continuous Infusions:  cefTRIAXone  (ROCEPHIN )  IV Stopped (02/28/24 1521)   magnesium  sulfate bolus IVPB 4 g (02/29/24 0939)    Nutritional status Signs/Symptoms: severe muscle depletion, severe fat depletion Interventions: Refer to RD note for recommendations Body mass index is 15.95 kg/m.  Data Reviewed:   CBC: Recent Labs  Lab 02/22/24 1145 02/22/24 1552 02/25/24 0146 02/25/24 1206 02/26/24 0547 02/27/24 0603 02/28/24 0406 02/29/24 0512  WBC 9.7   < > 15.1*  --  11.7* 9.6 8.6 9.1  NEUTROABS 8.9*  --   --   --   --   --   --   --   HGB 13.7   < > 9.1* 9.9* 8.9* 9.5* 9.0* 9.6*  HCT 46.8   < > 29.0* 29.0* 27.9* 29.7* 28.5* 31.1*  MCV 109.1*   < > 100.0  --  100.7* 101.0* 100.4* 103.3*  PLT 169   < > 96*  --  86* 107* 120* 173   < > = values in this interval not displayed.   Basic Metabolic Panel: Recent Labs  Lab 02/23/24 0349 02/23/24 1519 02/24/24 0803 02/24/24 1552 02/24/24 1744 02/25/24 0146 02/25/24 1206 02/26/24 0547 02/27/24 0603 02/28/24 0406 02/29/24 0512  NA 142  142   < >  --    < >  --  138 138 138 139 141 139  K 2.8*  2.8*   < >  --    < >  --  4.1 3.8 3.6 3.7 4.0 4.0  CL  90*   < >  --    < >  --  96*  --  96* 92* 88* 90*  CO2 38*   < >  --    < >  --  32  --  37* 41* >45* 41*  GLUCOSE 133*   < >  --    < >  --  126*  --  66* 101* 151* 136*  BUN 39*   < >  --    < >  --  16  --  16 13 11  24*  CREATININE 0.90   < >  --    < >  --  0.49*  --  0.56* 0.44* 0.42* 0.42*  CALCIUM  9.9   < >  --    < >  --  8.6*  --  8.5* 8.5* 8.5* 9.0  MG 1.8  --  1.5*  --   --  1.9  --  1.7 1.8 1.8 1.6*  PHOS 2.5  --  3.2  --  3.5  --   --   --   --  3.9  --    < > = values in this interval not displayed.   GFR: Estimated Creatinine Clearance: 64.7 mL/min (A) (by C-G formula based on SCr of 0.42 mg/dL (L)). Liver Function Tests: Recent Labs  Lab 02/22/24 1145 02/23/24 0803  AST 30 215*  ALT 19 292*  ALKPHOS 59 53  BILITOT 0.7 0.7  PROT 7.7 5.8*  ALBUMIN 4.1 3.0*   No results for input(s): LIPASE, AMYLASE in the last 168 hours. Recent Labs  Lab 02/22/24 1146  AMMONIA 47*   Coagulation Profile: Recent Labs  Lab 02/22/24 1145  INR 0.9   Cardiac Enzymes: No results for input(s): CKTOTAL, CKMB, CKMBINDEX, TROPONINI in the last 168 hours. BNP (last 3 results) No results for input(s): PROBNP in the last 8760 hours. HbA1C: No results for input(s): HGBA1C in the last 72 hours. CBG: Recent Labs  Lab 02/28/24 1613 02/28/24 2032 02/28/24 2349 02/29/24 0303 02/29/24 0749  GLUCAP 133* 95 140* 159* 103*   Lipid Profile: No results for input(s): CHOL, HDL, LDLCALC, TRIG, CHOLHDL, LDLDIRECT in the last 72 hours. Thyroid  Function Tests: No results for input(s): TSH, T4TOTAL, FREET4, T3FREE, THYROIDAB in the last 72 hours. Anemia Panel: No results for input(s): VITAMINB12, FOLATE, FERRITIN, TIBC, IRON, RETICCTPCT in the last 72 hours. Sepsis Labs: Recent Labs  Lab 02/22/24 1209 02/22/24 1607 02/23/24 0815 02/24/24 0721  LATICACIDVEN 1.0 15.0* 2.9* 2.2*    Recent Results (from the past 240 hours)  Culture,  blood (Routine X 2) w Reflex to ID Panel     Status: None   Collection Time: 02/22/24 11:50 AM   Specimen: BLOOD  Result Value Ref Range Status   Specimen Description BLOOD LEFT ANTECUBITAL  Final   Special Requests   Final    BOTTLES DRAWN AEROBIC AND ANAEROBIC Blood Culture results may not be optimal due to an inadequate volume of blood received in culture bottles   Culture   Final    NO GROWTH 6 DAYS Performed at [facility], [address] [place], [place] [address]    Report Status 02/28/2024 FINAL  Final  Culture, blood (Routine X 2) w Reflex to ID Panel     Status: None   Collection Time: 02/22/24  3:13 PM   Specimen: BLOOD LEFT ARM  Result Value Ref Range Status   Specimen Description BLOOD LEFT ARM  Final   Special Requests   Final    BOTTLES DRAWN AEROBIC AND ANAEROBIC Blood Culture adequate volume   Culture   Final    NO GROWTH 6 DAYS Performed at [facility], [address] [place], [place] [address]    Report Status 02/28/2024 FINAL  Final  MRSA Next Gen by PCR, Nasal     Status: None   Collection Time: 02/22/24  4:06 PM   Specimen: Nasal Mucosa; Nasal Swab  Result Value Ref Range Status   MRSA by PCR Next Gen NOT DETECTED NOT DETECTED Final    Comment: (NOTE) The GeneXpert MRSA Assay (FDA approved for NASAL specimens  only), is one component of a comprehensive MRSA colonization surveillance program. It is not intended to diagnose MRSA infection nor to guide or monitor treatment for MRSA infections. Test performance is not FDA approved in patients less than 42 years old. Performed at [facility], [address], [place], [place] [address]   SARS Coronavirus 2 by RT PCR (hospital order, performed in [facility] hospital lab) *cepheid single result test* Anterior Nasal Swab     Status: None   Collection Time: 02/24/24 12:17 PM   Specimen: Anterior Nasal Swab  Result Value Ref Range Status   SARS Coronavirus 2 by RT PCR NEGATIVE NEGATIVE Final     Comment: Performed at [facility], [address], [place], [place] [address]  Respiratory (~20 pathogens) panel by PCR     Status: None   Collection Time: 02/24/24 12:17 PM   Specimen: Nasopharyngeal Swab; Respiratory  Result Value Ref Range Status   Adenovirus NOT DETECTED NOT DETECTED Final   Coronavirus 229E NOT DETECTED NOT DETECTED Final    Comment: (NOTE) The Coronavirus on the Respiratory Panel, DOES NOT test for the novel  Coronavirus (2019 nCoV)    Coronavirus HKU1 NOT DETECTED NOT DETECTED Final   Coronavirus NL63 NOT DETECTED NOT DETECTED Final   Coronavirus OC43 NOT DETECTED NOT DETECTED Final   Metapneumovirus NOT DETECTED NOT DETECTED Final   Rhinovirus / Enterovirus NOT DETECTED NOT DETECTED Final   Influenza A NOT DETECTED NOT DETECTED Final   Influenza B NOT DETECTED NOT DETECTED Final   Parainfluenza Virus 1 NOT DETECTED NOT DETECTED Final   Parainfluenza Virus 2 NOT DETECTED NOT DETECTED Final   Parainfluenza Virus 3 NOT DETECTED NOT DETECTED Final   Parainfluenza Virus 4 NOT DETECTED NOT DETECTED Final   Respiratory Syncytial Virus NOT DETECTED NOT DETECTED Final   Bordetella pertussis NOT DETECTED NOT DETECTED Final   Bordetella Parapertussis NOT DETECTED NOT DETECTED Final   Chlamydophila pneumoniae NOT DETECTED NOT DETECTED Final   Mycoplasma pneumoniae NOT DETECTED NOT DETECTED Final    Comment: Performed at [facility], [address] [place], [place] [address]         Radiology Studies: No results found.         LOS: 7 days   Time spent= 35 mins    Anthony JAYSON Dare, MD [facility]  If 7PM-7AM, please contact night-coverage  02/29/2024, 9:53 AM

## 2024-02-29 NOTE — Plan of Care (Signed)

## 2024-02-29 NOTE — TOC Progression Note (Signed)
 Transition of Care [facility]) - Progression Note    Patient Details  Name: Anthony Nelson MRN: [id number] Date of Birth: Jan 24, 1961  Transition [facility] CM/SW Contact  Montie LOISE Louder, [place] Phone Number: 02/29/2024, 8:53 AM  Clinical Narrative:     CSW spoke with [facility] Case manager [name] Lambing # [phone] - she is here visiting with the patient and can assist with discharge planning once stable.   TOC will continue to follow and assist with discharge planning.   Montie Louder, MSW, LCSW Clinical Social Worker    Expected Discharge Plan: Long Term Acute Care (LTAC) Barriers to Discharge: Continued Medical Work up               Expected Discharge Plan and Services   Discharge Planning Services: CM Consult Post Acute Care Choice: Long Term Acute Care (LTAC), Skilled Nursing Facility Living arrangements for the past 2 months: Single Family Home                                       Social Drivers of Health (SDOH) Interventions SDOH Screenings   Food Insecurity: Patient Unable To Answer (02/23/2024)  Housing: Low Risk  (02/28/2024)  Transportation Needs: Patient Unable To Answer (02/23/2024)  Recent Concern: Transportation Needs - Unmet Transportation Needs (01/13/2024)  Utilities: Patient Unable To Answer (02/23/2024)  Alcohol Screen: Low Risk  (12/26/2022)  Depression (PHQ2-9): Low Risk  (01/16/2023)  Physical Activity: Inactive (07/17/2023)  Social Connections: Moderately Isolated (01/13/2024)  Stress: No Stress Concern Present (12/26/2022)  Tobacco Use: Medium Risk (02/22/2024)    Readmission Risk Interventions    02/24/2024    8:57 AM 07/12/2023    3:16 PM  Readmission Risk Prevention Plan  Transportation Screening Complete Complete  Medication Review (RN Care Manager) Complete Referral to Pharmacy  PCP or Specialist appointment within 3-5 days of discharge Complete Complete  HRI or Home Care Consult Complete   SW Recovery Care/Counseling Consult  Complete Complete  Palliative Care Screening  Not Applicable  Skilled Nursing Facility  Not Applicable

## 2024-03-01 DIAGNOSIS — J9621 Acute and chronic respiratory failure with hypoxia: Secondary | ICD-10-CM | POA: Diagnosis not present

## 2024-03-01 LAB — CBC
HCT: 33.6 % — ABNORMAL LOW (ref 39.0–52.0)
Hemoglobin: 10.1 g/dL — ABNORMAL LOW (ref 13.0–17.0)
MCH: 32.2 pg (ref 26.0–34.0)
MCHC: 30.1 g/dL (ref 30.0–36.0)
MCV: 107 fL — ABNORMAL HIGH (ref 80.0–100.0)
Platelets: 196 K/uL (ref 150–400)
RBC: 3.14 MIL/uL — ABNORMAL LOW (ref 4.22–5.81)
RDW: 12.5 % (ref 11.5–15.5)
WBC: 9.4 K/uL (ref 4.0–10.5)
nRBC: 0 % (ref 0.0–0.2)

## 2024-03-01 LAB — BASIC METABOLIC PANEL WITH GFR
Anion gap: 14 (ref 5–15)
BUN: 24 mg/dL — ABNORMAL HIGH (ref 8–23)
CO2: 31 mmol/L (ref 22–32)
Calcium: 8.6 mg/dL — ABNORMAL LOW (ref 8.9–10.3)
Chloride: 93 mmol/L — ABNORMAL LOW (ref 98–111)
Creatinine, Ser: 0.38 mg/dL — ABNORMAL LOW (ref 0.61–1.24)
GFR, Estimated: >60 mL/min (ref >60)
Glucose, Bld: 88 mg/dL (ref 70–99)
Potassium: 4.4 mmol/L (ref 3.5–5.1)
Sodium: 138 mmol/L (ref 135–145)

## 2024-03-01 LAB — GLUCOSE, CAPILLARY
Glucose-Capillary: 105 mg/dL — ABNORMAL HIGH (ref 70–99)
Glucose-Capillary: 108 mg/dL — ABNORMAL HIGH (ref 70–99)
Glucose-Capillary: 219 mg/dL — ABNORMAL HIGH (ref 70–99)
Glucose-Capillary: 82 mg/dL (ref 70–99)
Glucose-Capillary: 85 mg/dL (ref 70–99)
Glucose-Capillary: 91 mg/dL (ref 70–99)
Glucose-Capillary: 92 mg/dL (ref 70–99)

## 2024-03-01 LAB — MAGNESIUM: Magnesium: 2 mg/dL (ref 1.7–2.4)

## 2024-03-01 MED ORDER — ACETAMINOPHEN 325 MG PO TABS
650.0000 mg | ORAL_TABLET | Freq: Four times a day (QID) | ORAL | Status: DC | PRN
  Administered 2024-03-03: 650 mg via ORAL
  Filled 2024-03-01: qty 2

## 2024-03-01 MED ORDER — MIDODRINE HCL 5 MG PO TABS
10.0000 mg | ORAL_TABLET | Freq: Three times a day (TID) | ORAL | Status: DC
Start: 2024-03-01 — End: 2024-03-05
  Administered 2024-03-01 – 2024-03-04 (×10): 10 mg via ORAL
  Filled 2024-03-01 (×10): qty 2

## 2024-03-01 MED ORDER — ACETAMINOPHEN 160 MG/5ML PO SOLN: 650.0000 mg | Freq: Four times a day (QID) | ORAL | Status: DC | PRN

## 2024-03-01 NOTE — Plan of Care (Signed)

## 2024-03-01 NOTE — Progress Notes (Addendum)
 Pt has transfer order to [facility]. Report has been called. Pt will be  transferred . Family notified of transfer,  [name] , son).

## 2024-03-01 NOTE — Progress Notes (Signed)
 PROGRESS NOTE    Anthony Nelson  FMW:[id number] DOB: [birth year] DOA: 02/22/2024 PCP: [facility]    Brief Narrative:  63 year old cachectic/malnourished, chronic hypoxia with history of COPD, pulmonary HTN, diastolic CHF comes to the ED initially found to be unresponsive by his home health aide.  Due to concerns of unresponsiveness and inability to protect his airway he was intubated.  Upon arrival to the ICU he went into PEA cardiac arrest and ROSC was achieved in 9 minutes.  Following day he was spiking fever and became hypotensive requiring pressors.  He was empirically started on IV steroids and antibiotics due to concerns of severe COPD exacerbation and multifocal pneumonia.   Assessment & Plan:  Principal Problem:   Acute and chronic respiratory failure (acute-on-chronic) (HCC) Active Problems:   Physical deconditioning   Pressure injury of skin   Status post PEA arrest with hypoxia - Required 9 minutes of CPR thereafter ROSC was achieved.  Initially required pressors, now on midodrine .  Now remains stable, okay to DC cardiac monitor  Acute on chronic hypoxic respiratory failure Acute severe COPD exacerbation Septic shock secondary to multifocal pneumonia - Flu, COVID, RSV negative.  Patient is now improving after extubation and weaned off BiPAP.  Will slowly wean off nasal cannula as well.  Transitioned antibiotics to p.o. Augmentin .  Finished 7 days of steroids  Acute metabolic toxic encephalopathy, resolved - Slowly improving.  Limiting sedative medications.  Alkalosis - Somewhat chronic.  Improved with Diamox .  Severe protein calorie malnutrition - Patient is not interested in long-term feeding tube.  Discussed with family as well.  Will discontinue cortrak  Hypomagnesemia/hypokalemia - As needed repletion.  Anemia of chronic disease with macrocytosis - Baseline hemoglobin 9.0.  Continue to monitor.  Bilateral lower extremity blisters on the left -  Vancomycin  was added  Chronic diastolic CHF - Overall appears to be stable.  Holding diuretics  PCCM discussed case with family.  On 8/25, one-way extubation.  Patient is now DNR/DNI.  Palliative care team is following the patient.   DVT prophylaxis: Subcu heparin      Code Status: Limited: Do not attempt resuscitation (DNR) -DNR-LIMITED -Do Not Intubate/DNI  Family Communication:   Status is: Inpatient Remains inpatient appropriate because: SNF placement, pending placement  PT Follow up Recs: Skilled Nursing-Short Term Rehab (<3 Hours/Day)02/28/2024 1551  Subjective: Seen at bedside.  No complaints  Examination:  General exam: Appears calm and comfortable, cachectic frail Respiratory system: Clear to auscultation. Respiratory effort normal. Cardiovascular system: S1 & S2 heard, RRR. No JVD, murmurs, rubs, gallops or clicks. No pedal edema. Gastrointestinal system: Abdomen is nondistended, soft and nontender. No organomegaly or masses felt. Normal bowel sounds heard. Central nervous system: Alert and oriented. No focal neurological deficits. Extremities: Symmetric 5 x 5 power. Skin: No rashes, lesions or ulcers Psychiatry: Judgement and insight appear normal. Mood & affect appropriate.                Diet Orders (From admission, onward)     Start     Ordered   02/27/24 1032  Diet regular Room service appropriate? Yes with Assist; Fluid consistency: Thin  Diet effective now       Question Answer Comment  Room service appropriate? Yes with Assist   Fluid consistency: Thin      02/27/24 1032            Objective: Vitals:   03/01/24 0039 03/01/24 0345 03/01/24 0500 03/01/24 0807  BP: 100/73 101/76  93/66  Pulse: 84 87  78  Resp: 18 19  13   Temp: 98.7 F (37.1 C) 98.6 F (37 C)  (!) 97 F (36.1 C)  TempSrc: Oral Oral  Axillary  SpO2: 100% 100%  100%  Weight:   49 kg   Height:        Intake/Output Summary (Last 24 hours) at 03/01/2024 0932 Last data  filed at 03/01/2024 0042 Gross per 24 hour  Intake --  Output 2200 ml  Net -2200 ml   Filed Weights   02/28/24 0344 02/29/24 0120 03/01/24 0500  Weight: 49.1 kg 49 kg 49 kg    Scheduled Meds:  amoxicillin -clavulanate  1 tablet Oral Q12H   arformoterol   15 mcg Nebulization BID   aspirin  EC  81 mg Oral Daily   feeding supplement  237 mL Oral BID BM   heparin   5,000 Units Subcutaneous Q8H   midodrine   10 mg Oral Q8H   multivitamin with minerals  1 tablet Oral Daily   nutrition supplement (JUVEN)  1 packet Oral BID BM   revefenacin   175 mcg Nebulization Daily   Continuous Infusions:  Nutritional status Signs/Symptoms: severe muscle depletion, severe fat depletion Interventions: Refer to RD note for recommendations Body mass index is 15.95 kg/m.  Data Reviewed:   CBC: Recent Labs  Lab 02/25/24 0146 02/25/24 1206 02/26/24 0547 02/27/24 0603 02/28/24 0406 02/29/24 0512  WBC 15.1*  --  11.7* 9.6 8.6 9.1  HGB 9.1* 9.9* 8.9* 9.5* 9.0* 9.6*  HCT 29.0* 29.0* 27.9* 29.7* 28.5* 31.1*  MCV 100.0  --  100.7* 101.0* 100.4* 103.3*  PLT 96*  --  86* 107* 120* 173   Basic Metabolic Panel: Recent Labs  Lab 02/24/24 0803 02/24/24 1552 02/24/24 1744 02/25/24 0146 02/25/24 1206 02/26/24 0547 02/27/24 0603 02/28/24 0406 02/29/24 0512  NA  --    < >  --  138 138 138 139 141 139  K  --    < >  --  4.1 3.8 3.6 3.7 4.0 4.0  CL  --    < >  --  96*  --  96* 92* 88* 90*  CO2  --    < >  --  32  --  37* 41* >45* 41*  GLUCOSE  --    < >  --  126*  --  66* 101* 151* 136*  BUN  --    < >  --  16  --  16 13 11  24*  CREATININE  --    < >  --  0.49*  --  0.56* 0.44* 0.42* 0.42*  CALCIUM   --    < >  --  8.6*  --  8.5* 8.5* 8.5* 9.0  MG 1.5*  --   --  1.9  --  1.7 1.8 1.8 1.6*  PHOS 3.2  --  3.5  --   --   --   --  3.9  --    < > = values in this interval not displayed.   GFR: Estimated Creatinine Clearance: 64.7 mL/min (A) (by C-G formula based on SCr of 0.42 mg/dL (L)). Liver  Function Tests: No results for input(s): AST, ALT, ALKPHOS, BILITOT, PROT, ALBUMIN in the last 168 hours. No results for input(s): LIPASE, AMYLASE in the last 168 hours. No results for input(s): AMMONIA in the last 168 hours. Coagulation Profile: No results for input(s): INR, PROTIME in the last 168 hours. Cardiac Enzymes: No results for input(s): CKTOTAL, CKMB, CKMBINDEX, TROPONINI  in the last 168 hours. BNP (last 3 results) No results for input(s): PROBNP in the last 8760 hours. HbA1C: No results for input(s): HGBA1C in the last 72 hours. CBG: Recent Labs  Lab 02/29/24 1645 02/29/24 1945 03/01/24 0041 03/01/24 0352 03/01/24 0808  GLUCAP 132* 88 82 92 91   Lipid Profile: No results for input(s): CHOL, HDL, LDLCALC, TRIG, CHOLHDL, LDLDIRECT in the last 72 hours. Thyroid  Function Tests: No results for input(s): TSH, T4TOTAL, FREET4, T3FREE, THYROIDAB in the last 72 hours. Anemia Panel: No results for input(s): VITAMINB12, FOLATE, FERRITIN, TIBC, IRON, RETICCTPCT in the last 72 hours. Sepsis Labs: Recent Labs  Lab 02/24/24 9278  LATICACIDVEN 2.2*    Recent Results (from the past 240 hours)  Culture, blood (Routine X 2) w Reflex to ID Panel     Status: None   Collection Time: 02/22/24 11:50 AM   Specimen: BLOOD  Result Value Ref Range Status   Specimen Description BLOOD LEFT ANTECUBITAL  Final   Special Requests   Final    BOTTLES DRAWN AEROBIC AND ANAEROBIC Blood Culture results may not be optimal due to an inadequate volume of blood received in culture bottles   Culture   Final    NO GROWTH 6 DAYS Performed at [facility], [address] [place], [place] [address]    Report Status 02/28/2024 FINAL  Final  Culture, blood (Routine X 2) w Reflex to ID Panel     Status: None   Collection Time: 02/22/24  3:13 PM   Specimen: BLOOD LEFT ARM  Result Value Ref Range Status   Specimen Description  BLOOD LEFT ARM  Final   Special Requests   Final    BOTTLES DRAWN AEROBIC AND ANAEROBIC Blood Culture adequate volume   Culture   Final    NO GROWTH 6 DAYS Performed at [facility], [address], [place], [place] [address]    Report Status 02/28/2024 FINAL  Final  MRSA Next Gen by PCR, Nasal     Status: None   Collection Time: 02/22/24  4:06 PM   Specimen: Nasal Mucosa; Nasal Swab  Result Value Ref Range Status   MRSA by PCR Next Gen NOT DETECTED NOT DETECTED Final    Comment: (NOTE) The GeneXpert MRSA Assay (FDA approved for NASAL specimens only), is one component of a comprehensive MRSA colonization surveillance program. It is not intended to diagnose MRSA infection nor to guide or monitor treatment for MRSA infections. Test performance is not FDA approved in patients less than 77 years old. Performed at [facility], [address] [place], [place] [address]   SARS Coronavirus 2 by RT PCR (hospital order, performed in [facility] hospital lab) *cepheid single result test* Anterior Nasal Swab     Status: None   Collection Time: 02/24/24 12:17 PM   Specimen: Anterior Nasal Swab  Result Value Ref Range Status   SARS Coronavirus 2 by RT PCR NEGATIVE NEGATIVE Final    Comment: Performed at [facility], [address] [place], [place] [address]  Respiratory (~20 pathogens) panel by PCR     Status: None   Collection Time: 02/24/24 12:17 PM   Specimen: Nasopharyngeal Swab; Respiratory  Result Value Ref Range Status   Adenovirus NOT DETECTED NOT DETECTED Final   Coronavirus 229E NOT DETECTED NOT DETECTED Final    Comment: (NOTE) The Coronavirus on the Respiratory Panel, DOES NOT test for the novel  Coronavirus (2019 nCoV)  Coronavirus HKU1 NOT DETECTED NOT DETECTED Final   Coronavirus NL63 NOT DETECTED NOT DETECTED Final   Coronavirus OC43 NOT DETECTED NOT DETECTED Final   Metapneumovirus NOT DETECTED NOT DETECTED Final   Rhinovirus / Enterovirus NOT  DETECTED NOT DETECTED Final   Influenza A NOT DETECTED NOT DETECTED Final   Influenza B NOT DETECTED NOT DETECTED Final   Parainfluenza Virus 1 NOT DETECTED NOT DETECTED Final   Parainfluenza Virus 2 NOT DETECTED NOT DETECTED Final   Parainfluenza Virus 3 NOT DETECTED NOT DETECTED Final   Parainfluenza Virus 4 NOT DETECTED NOT DETECTED Final   Respiratory Syncytial Virus NOT DETECTED NOT DETECTED Final   Bordetella pertussis NOT DETECTED NOT DETECTED Final   Bordetella Parapertussis NOT DETECTED NOT DETECTED Final   Chlamydophila pneumoniae NOT DETECTED NOT DETECTED Final   Mycoplasma pneumoniae NOT DETECTED NOT DETECTED Final    Comment: Performed at [facility], [address], [place], [place] [address]         Radiology Studies: No results found.         LOS: 8 days   Time spent= 35 mins    Burgess JAYSON Dare, MD [facility]  If 7PM-7AM, please contact night-coverage  03/01/2024, 9:32 AM

## 2024-03-01 NOTE — Plan of Care (Signed)
 Problem: Education: Goal: Ability to describe self-care measures that may prevent or decrease complications (Diabetes Survival Skills Education) will improve 03/01/2024 0404 by Marvis Kenneth SAILOR, RN Outcome: Progressing 02/29/2024 2128 by Marvis Kenneth SAILOR, RN Outcome: Progressing Goal: Individualized Educational Video(s) 03/01/2024 0404 by Marvis Kenneth SAILOR, RN Outcome: Progressing 02/29/2024 2128 by Marvis Kenneth SAILOR, RN Outcome: Progressing   Problem: Coping: Goal: Ability to adjust to condition or change in health will improve 03/01/2024 0404 by Marvis Kenneth SAILOR, RN Outcome: Progressing 02/29/2024 2128 by Marvis Kenneth SAILOR, RN Outcome: Progressing   Problem: Fluid Volume: Goal: Ability to maintain a balanced intake and output will improve 03/01/2024 0404 by Marvis Kenneth SAILOR, RN Outcome: Progressing 02/29/2024 2128 by Marvis Kenneth SAILOR, RN Outcome: Progressing   Problem: Health Behavior/Discharge Planning: Goal: Ability to identify and utilize available resources and services will improve 03/01/2024 0404 by Marvis Kenneth SAILOR, RN Outcome: Progressing 02/29/2024 2128 by Marvis Kenneth SAILOR, RN Outcome: Progressing Goal: Ability to manage health-related needs will improve 03/01/2024 0404 by Marvis Kenneth SAILOR, RN Outcome: Progressing 02/29/2024 2128 by Marvis Kenneth SAILOR, RN Outcome: Progressing   Problem: Metabolic: Goal: Ability to maintain appropriate glucose levels will improve 03/01/2024 0404 by Marvis Kenneth SAILOR, RN Outcome: Progressing 02/29/2024 2128 by Marvis Kenneth SAILOR, RN Outcome: Progressing   Problem: Nutritional: Goal: Maintenance of adequate nutrition will improve 03/01/2024 0404 by Marvis Kenneth SAILOR, RN Outcome: Progressing 02/29/2024 2128 by Marvis Kenneth SAILOR, RN Outcome: Progressing Goal: Progress toward achieving an optimal weight will improve 03/01/2024 0404 by Marvis Kenneth SAILOR, RN Outcome: Progressing 02/29/2024 2128 by Marvis Kenneth SAILOR,  RN Outcome: Progressing   Problem: Skin Integrity: Goal: Risk for impaired skin integrity will decrease 03/01/2024 0404 by Marvis Kenneth SAILOR, RN Outcome: Progressing 02/29/2024 2128 by Marvis Kenneth SAILOR, RN Outcome: Progressing   Problem: Tissue Perfusion: Goal: Adequacy of tissue perfusion will improve 03/01/2024 0404 by Marvis Kenneth SAILOR, RN Outcome: Progressing 02/29/2024 2128 by Marvis Kenneth SAILOR, RN Outcome: Progressing   Problem: Education: Goal: Knowledge of General Education information will improve Description: Including pain rating scale, medication(s)/side effects and non-pharmacologic comfort measures 03/01/2024 0404 by Marvis Kenneth SAILOR, RN Outcome: Progressing 02/29/2024 2128 by Marvis Kenneth SAILOR, RN Outcome: Progressing   Problem: Health Behavior/Discharge Planning: Goal: Ability to manage health-related needs will improve 03/01/2024 0404 by Marvis Kenneth SAILOR, RN Outcome: Progressing 02/29/2024 2128 by Marvis Kenneth SAILOR, RN Outcome: Progressing   Problem: Clinical Measurements: Goal: Ability to maintain clinical measurements within normal limits will improve 03/01/2024 0404 by Marvis Kenneth SAILOR, RN Outcome: Progressing 02/29/2024 2128 by Marvis Kenneth SAILOR, RN Outcome: Progressing Goal: Will remain free from infection 03/01/2024 0404 by Marvis Kenneth SAILOR, RN Outcome: Progressing 02/29/2024 2128 by Marvis Kenneth SAILOR, RN Outcome: Progressing Goal: Diagnostic test results will improve 03/01/2024 0404 by Marvis Kenneth SAILOR, RN Outcome: Progressing 02/29/2024 2128 by Marvis Kenneth SAILOR, RN Outcome: Progressing Goal: Respiratory complications will improve 03/01/2024 0404 by Marvis Kenneth SAILOR, RN Outcome: Progressing 02/29/2024 2128 by Marvis Kenneth SAILOR, RN Outcome: Progressing Goal: Cardiovascular complication will be avoided 03/01/2024 0404 by Marvis Kenneth SAILOR, RN Outcome: Progressing 02/29/2024 2128 by Marvis Kenneth SAILOR, RN Outcome: Progressing    Problem: Activity: Goal: Risk for activity intolerance will decrease 03/01/2024 0404 by Marvis Kenneth SAILOR, RN Outcome: Progressing 02/29/2024 2128 by Marvis Kenneth SAILOR, RN Outcome: Progressing   Problem: Nutrition: Goal: Adequate nutrition will be maintained 03/01/2024 0404 by Marvis Kenneth SAILOR, RN Outcome: Progressing 02/29/2024 2128 by Marvis Kenneth SAILOR, RN Outcome: Progressing  Problem: Coping: Goal: Level of anxiety will decrease 03/01/2024 0404 by Marvis Kenneth SAILOR, RN Outcome: Progressing 02/29/2024 2128 by Marvis Kenneth SAILOR, RN Outcome: Progressing   Problem: Elimination: Goal: Will not experience complications related to bowel motility 03/01/2024 0404 by Marvis Kenneth SAILOR, RN Outcome: Progressing 02/29/2024 2128 by Marvis Kenneth SAILOR, RN Outcome: Progressing Goal: Will not experience complications related to urinary retention 03/01/2024 0404 by Marvis Kenneth SAILOR, RN Outcome: Progressing 02/29/2024 2128 by Marvis Kenneth SAILOR, RN Outcome: Progressing   Problem: Pain Managment: Goal: General experience of comfort will improve and/or be controlled 03/01/2024 0404 by Marvis Kenneth SAILOR, RN Outcome: Progressing 02/29/2024 2128 by Marvis Kenneth SAILOR, RN Outcome: Progressing   Problem: Safety: Goal: Ability to remain free from injury will improve 03/01/2024 0404 by Marvis Kenneth SAILOR, RN Outcome: Progressing 02/29/2024 2128 by Marvis Kenneth SAILOR, RN Outcome: Progressing   Problem: Skin Integrity: Goal: Risk for impaired skin integrity will decrease 03/01/2024 0404 by Marvis Kenneth SAILOR, RN Outcome: Progressing 02/29/2024 2128 by Marvis Kenneth SAILOR, RN Outcome: Progressing

## 2024-03-02 DIAGNOSIS — J9621 Acute and chronic respiratory failure with hypoxia: Secondary | ICD-10-CM | POA: Diagnosis not present

## 2024-03-02 LAB — MAGNESIUM: Magnesium: 1.7 mg/dL (ref 1.7–2.4)

## 2024-03-02 LAB — BASIC METABOLIC PANEL WITH GFR
Anion gap: 7 (ref 5–15)
BUN: 26 mg/dL — ABNORMAL HIGH (ref 8–23)
CO2: 43 mmol/L — ABNORMAL HIGH (ref 22–32)
Calcium: 9.1 mg/dL (ref 8.9–10.3)
Chloride: 90 mmol/L — ABNORMAL LOW (ref 98–111)
Creatinine, Ser: 0.37 mg/dL — ABNORMAL LOW (ref 0.61–1.24)
GFR, Estimated: >60 mL/min (ref >60)
Glucose, Bld: 123 mg/dL — ABNORMAL HIGH (ref 70–99)
Potassium: 3.9 mmol/L (ref 3.5–5.1)
Sodium: 140 mmol/L (ref 135–145)

## 2024-03-02 LAB — CBC
HCT: 29.9 % — ABNORMAL LOW (ref 39.0–52.0)
Hemoglobin: 9.4 g/dL — ABNORMAL LOW (ref 13.0–17.0)
MCH: 31.6 pg (ref 26.0–34.0)
MCHC: 31.4 g/dL (ref 30.0–36.0)
MCV: 100.7 fL — ABNORMAL HIGH (ref 80.0–100.0)
Platelets: 285 K/uL (ref 150–400)
RBC: 2.97 MIL/uL — ABNORMAL LOW (ref 4.22–5.81)
RDW: 12.2 % (ref 11.5–15.5)
WBC: 8.4 K/uL (ref 4.0–10.5)
nRBC: 0 % (ref 0.0–0.2)

## 2024-03-02 LAB — GLUCOSE, CAPILLARY
Glucose-Capillary: 128 mg/dL — ABNORMAL HIGH (ref 70–99)
Glucose-Capillary: 163 mg/dL — ABNORMAL HIGH (ref 70–99)
Glucose-Capillary: 70 mg/dL (ref 70–99)
Glucose-Capillary: 87 mg/dL (ref 70–99)

## 2024-03-02 MED ORDER — IPRATROPIUM-ALBUTEROL 0.5-2.5 (3) MG/3ML IN SOLN: 3.0000 mL | RESPIRATORY_TRACT | Status: AC | PRN

## 2024-03-02 MED ORDER — MIDODRINE HCL 10 MG PO TABS: 10.0000 mg | ORAL_TABLET | Freq: Three times a day (TID) | ORAL | Status: AC

## 2024-03-02 MED ORDER — ENSURE PLUS HIGH PROTEIN PO LIQD: 237.0000 mL | Freq: Two times a day (BID) | ORAL | Status: AC

## 2024-03-02 MED ORDER — ADULT MULTIVITAMIN W/MINERALS CH: 1.0000 | ORAL_TABLET | Freq: Every day | ORAL | Status: AC

## 2024-03-02 NOTE — TOC Progression Note (Addendum)
 Transition of Care [facility]) - Progression Note    Patient Details  Name: Anthony Nelson MRN: [id number] Date of Birth: 11-24-1960  Transition of Care [facility] CM/SW Contact  Isaiah Public, LCSWA Phone Number: 03/02/2024, 12:11 PM  Clinical Narrative:     CSW received consult for possible SNF placement at time of discharge. Patient gave CSW permission to speak with his daughter [name] regarding PT recommendation of SNF placement at time of discharge. CSW spoke with patients daughter [name] who informed CSW that PTA patient comes from home alone.Patients daughter expressed understanding of PT recommendation and is agreeable to SNF placement for patient at time of discharge. Patients daughter gave CSW permission to use patients humana benefit [id number] for SNF placement for patient . Patients daughter gave CSW permission to fax out initial referral for patient.CSW discussed insurance authorization process. Patients daughter had questions regarding next steps after patient completes rehab.CSW informed patients daughter that facility will help coordinate next steps after rehab. Patients daughter plans on following up with facility with questions regarding next steps/LTC questions. Patient gave permission for CSW to reach out to financial counseling to screen for medicaid. CSW emailed [place] in financial counseling and requested to screen patient for Medicaid. CSW received consult for outpatinet palliative services to follow patient at SNF. Patients daughter agreeable for outpatient palliative services to follow patient at SNF. CSW informed patients daughter that weekday CSW will follow up with her on who she would like for her to make referral to for patient.No further questions reported at this time. CSW to continue to follow and assist with discharge planning needs.   Update- CSW requested for Jon CMA to start insurance authorization for patient.  Expected Discharge Plan: Long Term Acute Care (LTAC) Barriers  to Discharge: Continued Medical Work up               Expected Discharge Plan and Services   Discharge Planning Services: CM Consult Post Acute Care Choice: Long Term Acute Care (LTAC), Skilled Nursing Facility Living arrangements for the past 2 months: Single Family Home Expected Discharge Date: 03/02/24                                     Social Drivers of Health (SDOH) Interventions SDOH Screenings   Food Insecurity: Patient Unable To Answer (02/23/2024)  Housing: Low Risk  (02/28/2024)  Transportation Needs: Patient Unable To Answer (02/23/2024)  Recent Concern: Transportation Needs - Unmet Transportation Needs (01/13/2024)  Utilities: Patient Unable To Answer (02/23/2024)  Alcohol Screen: Low Risk  (12/26/2022)  Depression (PHQ2-9): Low Risk  (01/16/2023)  Physical Activity: Inactive (07/17/2023)  Social Connections: Moderately Isolated (01/13/2024)  Stress: No Stress Concern Present (12/26/2022)  Tobacco Use: Medium Risk (02/22/2024)    Readmission Risk Interventions    02/24/2024    8:57 AM 07/12/2023    3:16 PM  Readmission Risk Prevention Plan  Transportation Screening Complete Complete  Medication Review (RN Care Manager) Complete Referral to Pharmacy  PCP or Specialist appointment within 3-5 days of discharge Complete Complete  HRI or Home Care Consult Complete   SW Recovery Care/Counseling Consult Complete Complete  Palliative Care Screening  Not Applicable  Skilled Nursing Facility  Not Applicable

## 2024-03-02 NOTE — [place] FL2 (Signed)
 [place]  MEDICAID FL2 LEVEL OF CARE FORM     IDENTIFICATION  Patient Name: Anthony Nelson Birthdate: [birth year] Sex: male Admission Date (Current Location): 02/22/2024  [facility] and [place] Number:  Producer, television/film/video and Address:  [facility] [place], [address], [place], [place] [address]      Provider Number: [id number]  Attending Physician Name and Address:  Caleen Burgess BROCKS, MD  Relative Name and Phone Number:  Laurena Spoon (daughter) [phone]    Current Level of Care: Hospital Recommended Level of Care: Skilled Nursing Facility Prior Approval Number:    Date Approved/Denied:   PASRR Number: [id number]  Discharge Plan: SNF    Current Diagnoses: Patient Active Problem List   Diagnosis Date Noted   Physical deconditioning 02/26/2024   Pressure injury of skin 02/26/2024   Acute and chronic respiratory failure (acute-on-chronic) (HCC) 02/22/2024   Respiratory failure with hypoxia (HCC) 07/07/2023   COPD with acute exacerbation (HCC) 04/28/2023   Protein-calorie malnutrition, severe 04/11/2023   COPD exacerbation (HCC) 04/09/2023   Chronic right-sided congestive heart failure (HCC) 01/16/2023   Homeless 01/16/2023   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   Gastroesophageal reflux disease 01/29/2022   Hyperlipidemia 10/04/2020   Macrocytosis 09/03/2020   Atherosclerosis of aorta (HCC) 04/06/2020   Weight loss 08/11/2019   Chronic diastolic heart failure (HCC) 04/25/2019   Chronic respiratory failure with hypoxia and hypercapnia (HCC) 04/25/2019   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   Cor pulmonale (chronic) (HCC) 03/19/2018   Chronic obstructive pulmonary disease (HCC) 03/18/2018   Former smoker 02/08/2018    Orientation RESPIRATION BLADDER Height & Weight     Self, Time, Situation, Place  O2 (Nasal Cannula 4 liters) Continent, External catheter (External Urinary Catheter) Weight: 98 lb 8.7 oz (44.7  kg) Height:  5' 9 (175.3 cm)  BEHAVIORAL SYMPTOMS/MOOD NEUROLOGICAL BOWEL NUTRITION STATUS      Continent Diet (Please see discharge summary)  AMBULATORY STATUS COMMUNICATION OF NEEDS Skin   Extensive Assist Verbally Other (Comment) (Blister,Leg,L,Ecchymosis,Buttocks,Leg,sacrum,Bil.,Erythema,Buttocks,sacrum,R,L,Bil.,Wound/Incision LDAs,Wound PI sacrum mid stage 2)                       Personal Care Assistance Level of Assistance  Bathing, Feeding, Dressing Bathing Assistance: Maximum assistance Feeding assistance: Limited assistance Dressing Assistance: Maximum assistance     Functional Limitations Info  Sight, Hearing, Speech Sight Info: Impaired Hearing Info: Adequate Speech Info: Adequate    SPECIAL CARE FACTORS FREQUENCY  PT (By licensed PT), OT (By licensed OT)     PT Frequency: 5x min weekly OT Frequency: 5x min weekly            Contractures Contractures Info: Not present    Additional Factors Info  Code Status, Allergies Code Status Info: DNR Allergies Info: Erythromycin           Current Medications (03/02/2024):  This is the current hospital active medication list Current Facility-Administered Medications  Medication Dose Route Frequency Provider Last Rate Last Admin   acetaminophen  (TYLENOL ) tablet 650 mg  650 mg Oral Q6H PRN Amin, Ankit C, MD       arformoterol  (BROVANA ) nebulizer solution 15 mcg  15 mcg Nebulization BID Gretta Doffing P, DO   15 mcg at 03/02/24 9167   aspirin  EC tablet 81 mg  81 mg Oral Daily Amin, Ankit C, MD   81 mg at 03/02/24 0950   docusate sodium  (COLACE) capsule 100 mg  100 mg Oral BID PRN  Harold Scholz, MD       feeding supplement (ENSURE PLUS HIGH PROTEIN) liquid 237 mL  237 mL Oral BID BM Amin, Ankit C, MD   237 mL at 03/02/24 0950   glucagon  (human recombinant) (GLUCAGEN) injection 1 mg  1 mg Intravenous PRN Amin, Ankit C, MD       haloperidol  lactate (HALDOL ) injection 5 mg  5 mg Intravenous Q4H PRN Paliwal, Aditya, MD    5 mg at 02/23/24 2157   heparin  injection 5,000 Units  5,000 Units Subcutaneous Q8H Harold Scholz, MD   5,000 Units at 03/02/24 0550   hydrALAZINE  (APRESOLINE ) injection 10 mg  10 mg Intravenous Q4H PRN Amin, Ankit C, MD       ipratropium-albuterol  (DUONEB) 0.5-2.5 (3) MG/3ML nebulizer solution 3 mL  3 mL Nebulization Q4H PRN Gretta Doffing P, DO       metoprolol  tartrate (LOPRESSOR ) injection 5 mg  5 mg Intravenous Q4H PRN Amin, Ankit C, MD       midodrine  (PROAMATINE ) tablet 10 mg  10 mg Oral Q8H Amin, Ankit C, MD   10 mg at 03/02/24 0549   multivitamin with minerals tablet 1 tablet  1 tablet Oral Daily Amin, Ankit C, MD   1 tablet at 03/02/24 0950   nutrition supplement (JUVEN) (JUVEN) powder packet 1 packet  1 packet Oral BID BM Amin, Ankit C, MD   1 packet at 03/02/24 0950   ondansetron  (ZOFRAN ) injection 4 mg  4 mg Intravenous Q6H PRN Amin, Ankit C, MD       polyethylene glycol (MIRALAX  / GLYCOLAX ) packet 17 g  17 g Oral Daily PRN Harold Scholz, MD       revefenacin  (YUPELRI ) nebulizer solution 175 mcg  175 mcg Nebulization Daily Gretta Doffing SQUIBB, DO   175 mcg at 03/02/24 9167     Discharge Medications: Please see discharge summary for a list of discharge medications.  Relevant Imaging Results:  Relevant Lab Results:   Additional Information [id number]  Isaiah Public, LCSWA

## 2024-03-02 NOTE — Plan of Care (Signed)
   Medical records reviewed including progress notes, labs and imaging.   Goals of care are clear for SNF placement with outpatient palliative care follow up. Patient and family prefer to avoid PEG.   Thank you for your referral and allowing PMT to assist in [name]'s care.   Raynah Gomes, PA-C Palliative Medicine Team  Team Phone # [phone]   NO CHARGE

## 2024-03-02 NOTE — Progress Notes (Signed)
 PROGRESS NOTE    Anthony Nelson  FMW:[id number] DOB: [birth year] DOA: 02/22/2024 PCP: [facility]    Brief Narrative:  63 year old cachectic/malnourished, chronic hypoxia with history of COPD, pulmonary HTN, diastolic CHF comes to the ED initially found to be unresponsive by his home health aide.  Due to concerns of unresponsiveness and inability to protect his airway he was intubated.  Upon arrival to the ICU he went into PEA cardiac arrest and ROSC was achieved in 9 minutes.  Following day he was spiking fever and became hypotensive requiring pressors.  He was empirically started on IV steroids and antibiotics due to concerns of severe COPD exacerbation and multifocal pneumonia. During hospitalization completed course of antibiotics and steroids, slowly weaning down on oxygen .  Initially had cortrak in place due to malnourishment requiring tube feeds but eventually patient and family declined long-term feeding tube.  At this time his best is to encourage him with oral intake and provide supplemental nutrition as necessary. Currently medically stable for discharge to SNF.  Assessment & Plan:  Principal Problem:   Acute and chronic respiratory failure (acute-on-chronic) (HCC) Active Problems:   Physical deconditioning   Pressure injury of skin   Status post PEA arrest with hypoxia - Required 9 minutes of CPR thereafter ROSC was achieved.  Initially required pressors, now on midodrine .  Now remains stable, okay to DC cardiac monitor  Acute on chronic hypoxic respiratory failure Acute severe COPD exacerbation Septic shock secondary to multifocal pneumonia - Flu, COVID, RSV negative.  Patient is now improving after extubation and weaned off BiPAP.  Will slowly wean off nasal cannula as well.  Discontinue antibiotics.  Completed 7 days of steroids  Acute metabolic toxic encephalopathy, resolved - Slowly improving.  Limiting sedative medications.  Alkalosis - Somewhat chronic.   Improved with Diamox .  Severe protein calorie malnutrition - Patient is not interested in long-term feeding tube.  Discussed with family as well.  Cortrak removed  Hypomagnesemia/hypokalemia - As needed repletion.  Anemia of chronic disease with macrocytosis - Baseline hemoglobin 9.0.  Continue to monitor.  Bilateral lower extremity blisters on the left Resolved with short course of vancomycin  and also received Rocephin   Chronic diastolic CHF - Overall appears to be stable.  Holding diuretics  PCCM discussed case with family.  On 8/25, one-way extubation.  Patient is now DNR/DNI.  Palliative care team is following the patient.   DVT prophylaxis: Subcu heparin      Code Status: Limited: Do not attempt resuscitation (DNR) -DNR-LIMITED -Do Not Intubate/DNI  Family Communication:   Status is: Inpatient Remains inpatient appropriate because: SNF placement, pending placement  PT Follow up Recs: Skilled Nursing-Short Term Rehab (<3 Hours/Day)02/28/2024 1551  Subjective: Eating his breakfast Examination:  General exam: Appears calm and comfortable, cachectic frail Respiratory system: Clear to auscultation. Respiratory effort normal. Cardiovascular system: S1 & S2 heard, RRR. No JVD, murmurs, rubs, gallops or clicks. No pedal edema. Gastrointestinal system: Abdomen is nondistended, soft and nontender. No organomegaly or masses felt. Normal bowel sounds heard. Central nervous system: Alert and oriented. No focal neurological deficits. Extremities: Symmetric 5 x 5 power. Skin: No rashes, lesions or ulcers Psychiatry: Judgement and insight appear normal. Mood & affect appropriate.                Diet Orders (From admission, onward)     Start     Ordered   02/27/24 1032  Diet regular Room service appropriate? Yes with Assist; Fluid consistency: Thin  Diet effective now  Question Answer Comment  Room service appropriate? Yes with Assist   Fluid consistency: Thin       02/27/24 1032            Objective: Vitals:   03/02/24 0343 03/02/24 0613 03/02/24 0833 03/02/24 0859  BP: 92/62   109/88  Pulse: 85   87  Resp: 18   20  Temp: (!) 97.5 F (36.4 C)   98.9 F (37.2 C)  TempSrc:    Oral  SpO2: 100%  100% 100%  Weight:  44.7 kg    Height:        Intake/Output Summary (Last 24 hours) at 03/02/2024 1058 Last data filed at 03/02/2024 0018 Gross per 24 hour  Intake 237 ml  Output --  Net 237 ml   Filed Weights   02/29/24 0120 03/01/24 0500 03/02/24 0613  Weight: 49 kg 49 kg 44.7 kg    Scheduled Meds:  arformoterol   15 mcg Nebulization BID   aspirin  EC  81 mg Oral Daily   feeding supplement  237 mL Oral BID BM   heparin   5,000 Units Subcutaneous Q8H   midodrine   10 mg Oral Q8H   multivitamin with minerals  1 tablet Oral Daily   nutrition supplement (JUVEN)  1 packet Oral BID BM   revefenacin   175 mcg Nebulization Daily   Continuous Infusions:  Nutritional status Signs/Symptoms: severe muscle depletion, severe fat depletion Interventions: Refer to RD note for recommendations Body mass index is 14.55 kg/m.  Data Reviewed:   CBC: Recent Labs  Lab 02/27/24 0603 02/28/24 0406 02/29/24 0512 03/01/24 0847 03/02/24 0341  WBC 9.6 8.6 9.1 9.4 8.4  HGB 9.5* 9.0* 9.6* 10.1* 9.4*  HCT 29.7* 28.5* 31.1* 33.6* 29.9*  MCV 101.0* 100.4* 103.3* 107.0* 100.7*  PLT 107* 120* 173 196 285   Basic Metabolic Panel: Recent Labs  Lab 02/24/24 1744 02/25/24 0146 02/27/24 0603 02/28/24 0406 02/29/24 0512 03/01/24 0847 03/02/24 0341  NA  --    < > 139 141 139 138 140  K  --    < > 3.7 4.0 4.0 4.4 3.9  CL  --    < > 92* 88* 90* 93* 90*  CO2  --    < > 41* >45* 41* 31 43*  GLUCOSE  --    < > 101* 151* 136* 88 123*  BUN  --    < > 13 11 24* 24* 26*  CREATININE  --    < > 0.44* 0.42* 0.42* 0.38* 0.37*  CALCIUM   --    < > 8.5* 8.5* 9.0 8.6* 9.1  MG  --    < > 1.8 1.8 1.6* 2.0 1.7  PHOS 3.5  --   --  3.9  --   --   --    < > = values in  this interval not displayed.   GFR: Estimated Creatinine Clearance: 59 mL/min (A) (by C-G formula based on SCr of 0.37 mg/dL (L)). Liver Function Tests: No results for input(s): AST, ALT, ALKPHOS, BILITOT, PROT, ALBUMIN in the last 168 hours. No results for input(s): LIPASE, AMYLASE in the last 168 hours. No results for input(s): AMMONIA in the last 168 hours. Coagulation Profile: No results for input(s): INR, PROTIME in the last 168 hours. Cardiac Enzymes: No results for input(s): CKTOTAL, CKMB, CKMBINDEX, TROPONINI in the last 168 hours. BNP (last 3 results) No results for input(s): PROBNP in the last 8760 hours. HbA1C: No results for input(s): HGBA1C in the  last 72 hours. CBG: Recent Labs  Lab 03/01/24 1822 03/01/24 2000 03/01/24 2332 03/02/24 0011 03/02/24 0346  GLUCAP 219* 108* 85 70 128*   Lipid Profile: No results for input(s): CHOL, HDL, LDLCALC, TRIG, CHOLHDL, LDLDIRECT in the last 72 hours. Thyroid  Function Tests: No results for input(s): TSH, T4TOTAL, FREET4, T3FREE, THYROIDAB in the last 72 hours. Anemia Panel: No results for input(s): VITAMINB12, FOLATE, FERRITIN, TIBC, IRON, RETICCTPCT in the last 72 hours. Sepsis Labs: No results for input(s): PROCALCITON, LATICACIDVEN in the last 168 hours.  Recent Results (from the past 240 hours)  Culture, blood (Routine X 2) w Reflex to ID Panel     Status: None   Collection Time: 02/22/24 11:50 AM   Specimen: BLOOD  Result Value Ref Range Status   Specimen Description BLOOD LEFT ANTECUBITAL  Final   Special Requests   Final    BOTTLES DRAWN AEROBIC AND ANAEROBIC Blood Culture results may not be optimal due to an inadequate volume of blood received in culture bottles   Culture   Final    NO GROWTH 6 DAYS Performed at [facility], [address], [place], [place] [address]    Report Status 02/28/2024 FINAL  Final  Culture, blood (Routine X  2) w Reflex to ID Panel     Status: None   Collection Time: 02/22/24  3:13 PM   Specimen: BLOOD LEFT ARM  Result Value Ref Range Status   Specimen Description BLOOD LEFT ARM  Final   Special Requests   Final    BOTTLES DRAWN AEROBIC AND ANAEROBIC Blood Culture adequate volume   Culture   Final    NO GROWTH 6 DAYS Performed at [facility], [address] [place], [place] [address]    Report Status 02/28/2024 FINAL  Final  MRSA Next Gen by PCR, Nasal     Status: None   Collection Time: 02/22/24  4:06 PM   Specimen: Nasal Mucosa; Nasal Swab  Result Value Ref Range Status   MRSA by PCR Next Gen NOT DETECTED NOT DETECTED Final    Comment: (NOTE) The GeneXpert MRSA Assay (FDA approved for NASAL specimens only), is one component of a comprehensive MRSA colonization surveillance program. It is not intended to diagnose MRSA infection nor to guide or monitor treatment for MRSA infections. Test performance is not FDA approved in patients less than 70 years old. Performed at [facility], [address], [place], [place] [address]   SARS Coronavirus 2 by RT PCR (hospital order, performed in [facility] hospital lab) *cepheid single result test* Anterior Nasal Swab     Status: None   Collection Time: 02/24/24 12:17 PM   Specimen: Anterior Nasal Swab  Result Value Ref Range Status   SARS Coronavirus 2 by RT PCR NEGATIVE NEGATIVE Final    Comment: Performed at [facility], [address] [place], [place] [address]  Respiratory (~20 pathogens) panel by PCR     Status: None   Collection Time: 02/24/24 12:17 PM   Specimen: Nasopharyngeal Swab; Respiratory  Result Value Ref Range Status   Adenovirus NOT DETECTED NOT DETECTED Final   Coronavirus 229E NOT DETECTED NOT DETECTED Final    Comment: (NOTE) The Coronavirus on the Respiratory Panel, DOES NOT test for the novel  Coronavirus (2019 nCoV)    Coronavirus HKU1 NOT DETECTED NOT DETECTED Final   Coronavirus NL63 NOT  DETECTED NOT DETECTED Final   Coronavirus OC43 NOT DETECTED NOT DETECTED Final  Metapneumovirus NOT DETECTED NOT DETECTED Final   Rhinovirus / Enterovirus NOT DETECTED NOT DETECTED Final   Influenza A NOT DETECTED NOT DETECTED Final   Influenza B NOT DETECTED NOT DETECTED Final   Parainfluenza Virus 1 NOT DETECTED NOT DETECTED Final   Parainfluenza Virus 2 NOT DETECTED NOT DETECTED Final   Parainfluenza Virus 3 NOT DETECTED NOT DETECTED Final   Parainfluenza Virus 4 NOT DETECTED NOT DETECTED Final   Respiratory Syncytial Virus NOT DETECTED NOT DETECTED Final   Bordetella pertussis NOT DETECTED NOT DETECTED Final   Bordetella Parapertussis NOT DETECTED NOT DETECTED Final   Chlamydophila pneumoniae NOT DETECTED NOT DETECTED Final   Mycoplasma pneumoniae NOT DETECTED NOT DETECTED Final    Comment: Performed at [facility], [address] [place], [place] [address]         Radiology Studies: No results found.         LOS: 9 days   Time spent= 35 mins    Anthony JAYSON Dare, MD [facility]  If 7PM-7AM, please contact night-coverage  03/02/2024, 10:58 AM

## 2024-03-02 NOTE — Plan of Care (Incomplete)

## 2024-03-03 DIAGNOSIS — J9621 Acute and chronic respiratory failure with hypoxia: Secondary | ICD-10-CM | POA: Diagnosis not present

## 2024-03-03 LAB — CBC
HCT: 31.5 % — ABNORMAL LOW (ref 39.0–52.0)
Hemoglobin: 10 g/dL — ABNORMAL LOW (ref 13.0–17.0)
MCH: 31.9 pg (ref 26.0–34.0)
MCHC: 31.7 g/dL (ref 30.0–36.0)
MCV: 100.6 fL — ABNORMAL HIGH (ref 80.0–100.0)
Platelets: 343 10*3/uL (ref 150–400)
RBC: 3.13 MIL/uL — ABNORMAL LOW (ref 4.22–5.81)
RDW: 12.3 % (ref 11.5–15.5)
WBC: 8.4 10*3/uL (ref 4.0–10.5)
nRBC: 0 % (ref 0.0–0.2)

## 2024-03-03 LAB — BASIC METABOLIC PANEL WITH GFR
Anion gap: 11 (ref 5–15)
BUN: 18 mg/dL (ref 8–23)
CO2: 41 mmol/L — ABNORMAL HIGH (ref 22–32)
Calcium: 9.2 mg/dL (ref 8.9–10.3)
Chloride: 89 mmol/L — ABNORMAL LOW (ref 98–111)
Creatinine, Ser: 0.44 mg/dL — ABNORMAL LOW (ref 0.61–1.24)
GFR, Estimated: >60 mL/min (ref >60)
Glucose, Bld: 96 mg/dL (ref 70–99)
Potassium: 4.1 mmol/L (ref 3.5–5.1)
Sodium: 141 mmol/L (ref 135–145)

## 2024-03-03 LAB — GLUCOSE, CAPILLARY
Glucose-Capillary: 86 mg/dL (ref 70–99)
Glucose-Capillary: 84 mg/dL (ref 70–99)
Glucose-Capillary: 130 mg/dL — ABNORMAL HIGH (ref 70–99)
Glucose-Capillary: 139 mg/dL — ABNORMAL HIGH (ref 70–99)
Glucose-Capillary: 116 mg/dL — ABNORMAL HIGH (ref 70–99)

## 2024-03-03 LAB — MAGNESIUM: Magnesium: 1.8 mg/dL (ref 1.7–2.4)

## 2024-03-03 MED ORDER — SODIUM CHLORIDE 0.9 % IV BOLUS
500.0000 mL | Freq: Once | INTRAVENOUS | Status: AC
  Administered 2024-03-03: 500 mL via INTRAVENOUS

## 2024-03-03 NOTE — TOC Progression Note (Addendum)
 Transition of Care [facility]) - Progression Note    Patient Details  Name: Anthony Nelson MRN: [id number] Date of Birth: 1960/09/18  Transition of Care [facility] CM/SW Contact  Sherline Clack, [place] Phone Number: 03/03/2024, 1:59 PM  Clinical Narrative:     CSW spoke with Endiah/daughter on the phone and presented SNF options and ratings. Daughter identified [facility] as choice facility for discharge. CSW will reach out to facility and begin insurance auth process today. CSW also spoke with Lisa/[facility] case manager and updated her on patient's hospital stay and anticipated discharge to SNF.  4:10 pm: Auth started for [facility], currently pending.   CSW will continue to follow.  Expected Discharge Plan: Skilled Nursing Facility Barriers to Discharge: Continued Medical Work up               Expected Discharge Plan and Services   Discharge Planning Services: CM Consult Post Acute Care Choice: Long Term Acute Care (LTAC), Skilled Nursing Facility Living arrangements for the past 2 months: Single Family Home Expected Discharge Date: 03/02/24                                     Social Drivers of Health (SDOH) Interventions SDOH Screenings   Food Insecurity: Patient Unable To Answer (02/23/2024)  Housing: Low Risk  (02/28/2024)  Transportation Needs: Patient Unable To Answer (02/23/2024)  Recent Concern: Transportation Needs - Unmet Transportation Needs (01/13/2024)  Utilities: Patient Unable To Answer (02/23/2024)  Alcohol Screen: Low Risk  (12/26/2022)  Depression (PHQ2-9): Low Risk  (01/16/2023)  Physical Activity: Inactive (07/17/2023)  Social Connections: Moderately Isolated (01/13/2024)  Stress: No Stress Concern Present (12/26/2022)  Tobacco Use: Medium Risk (02/22/2024)    Readmission Risk Interventions    02/24/2024    8:57 AM 07/12/2023    3:16 PM  Readmission Risk Prevention Plan  Transportation Screening Complete Complete  Medication Review (RN Care  Manager) Complete Referral to Pharmacy  PCP or Specialist appointment within 3-5 days of discharge Complete Complete  HRI or Home Care Consult Complete   SW Recovery Care/Counseling Consult Complete Complete  Palliative Care Screening  Not Applicable  Skilled Nursing Facility  Not Applicable

## 2024-03-03 NOTE — Progress Notes (Signed)
 PROGRESS NOTE    Anthony Nelson  FMW:[id number] DOB: [birth year] DOA: 02/22/2024 PCP: [facility]    Brief Narrative:  63 year old cachectic/malnourished, chronic hypoxia with history of COPD, pulmonary HTN, diastolic CHF comes to the ED initially found to be unresponsive by his home health aide.  Due to concerns of unresponsiveness and inability to protect his airway he was intubated.  Upon arrival to the ICU he went into PEA cardiac arrest and ROSC was achieved in 9 minutes.  Following day he was spiking fever and became hypotensive requiring pressors.  He was empirically started on IV steroids and antibiotics due to concerns of severe COPD exacerbation and multifocal pneumonia. During hospitalization completed course of antibiotics and steroids, slowly weaning down on oxygen .  Initially had cortrak in place due to malnourishment requiring tube feeds but eventually patient and family declined long-term feeding tube.  At this time his best is to encourage him with oral intake and provide supplemental nutrition as necessary. Currently medically stable for discharge to SNF.  Assessment & Plan:  Principal Problem:   Acute and chronic respiratory failure (acute-on-chronic) (HCC) Active Problems:   Physical deconditioning   Pressure injury of skin   Status post PEA arrest with hypoxia - Required 9 minutes of CPR thereafter ROSC was achieved.  Initially required pressors, now on midodrine .  Now remains stable, okay to DC cardiac monitor  Acute on chronic hypoxic respiratory failure Acute severe COPD exacerbation Septic shock secondary to multifocal pneumonia - Flu, COVID, RSV negative.  Patient is now improving after extubation and weaned off BiPAP.  Will slowly wean off nasal cannula as well.  Discontinue antibiotics.  Completed 7 days of steroids  Acute metabolic toxic encephalopathy, resolved - Resolved.   Alkalosis - Somewhat chronic.  Improved with one dose of  Diamox .  Severe protein calorie malnutrition - Patient is not interested in long-term feeding tube.  Discussed with family as well.  Cortrak removed  Hypomagnesemia/hypokalemia - As needed repletion.  Anemia of chronic disease with macrocytosis - Baseline hemoglobin 10  Bilateral lower extremity blisters on the left Resolved with short course of vancomycin  and also received Rocephin   Chronic diastolic CHF - Overall appears to be stable.  Holding diuretics  Seen by PCCM and Palliative, He is DNR/DNI   DVT prophylaxis: Subcu heparin      Code Status: Limited: Do not attempt resuscitation (DNR) -DNR-LIMITED -Do Not Intubate/DNI  Family Communication:  Daughter is uptodate Status is: Inpatient Remains inpatient appropriate because: SNF placement, pending placement  PT Follow up Recs: Skilled Nursing-Short Term Rehab (<3 Hours/Day)02/28/2024 1551  Subjective: Feeling okay no complaints.  Waiting for his breakfast this morning. Examination:  General exam: Appears calm and comfortable, cachectic frail Respiratory system: Clear to auscultation. Respiratory effort normal. Cardiovascular system: S1 & S2 heard, RRR. No JVD, murmurs, rubs, gallops or clicks. No pedal edema. Gastrointestinal system: Abdomen is nondistended, soft and nontender. No organomegaly or masses felt. Normal bowel sounds heard. Central nervous system: Alert and oriented. No focal neurological deficits. Extremities: Symmetric 5 x 5 power. Skin: No rashes, lesions or ulcers Psychiatry: Judgement and insight appear normal. Mood & affect appropriate.                Diet Orders (From admission, onward)     Start     Ordered   02/27/24 1032  Diet regular Room service appropriate? Yes with Assist; Fluid consistency: Thin  Diet effective now       Question Answer Comment  Room service appropriate? Yes with Assist   Fluid consistency: Thin      02/27/24 1032            Objective: Vitals:    03/03/24 0545 03/03/24 0834 03/03/24 0837 03/03/24 0847  BP: (!) 88/54   (!) 91/55  Pulse: 91 78  82  Resp:  16    Temp: 97.8 F (36.6 C)     TempSrc: Oral     SpO2: 100% 100% 100% 100%  Weight:      Height:        Intake/Output Summary (Last 24 hours) at 03/03/2024 1050 Last data filed at 03/02/2024 2344 Gross per 24 hour  Intake --  Output 1000 ml  Net -1000 ml   Filed Weights   02/29/24 0120 03/01/24 0500 03/02/24 0613  Weight: 49 kg 49 kg 44.7 kg    Scheduled Meds:  arformoterol   15 mcg Nebulization BID   aspirin  EC  81 mg Oral Daily   feeding supplement  237 mL Oral BID BM   heparin   5,000 Units Subcutaneous Q8H   midodrine   10 mg Oral Q8H   multivitamin with minerals  1 tablet Oral Daily   nutrition supplement (JUVEN)  1 packet Oral BID BM   revefenacin   175 mcg Nebulization Daily   Continuous Infusions:  Nutritional status Signs/Symptoms: severe muscle depletion, severe fat depletion Interventions: Refer to RD note for recommendations Body mass index is 14.55 kg/m.  Data Reviewed:   CBC: Recent Labs  Lab 02/28/24 0406 02/29/24 0512 03/01/24 0847 03/02/24 0341 03/03/24 0413  WBC 8.6 9.1 9.4 8.4 8.4  HGB 9.0* 9.6* 10.1* 9.4* 10.0*  HCT 28.5* 31.1* 33.6* 29.9* 31.5*  MCV 100.4* 103.3* 107.0* 100.7* 100.6*  PLT 120* 173 196 285 343   Basic Metabolic Panel: Recent Labs  Lab 02/28/24 0406 02/29/24 0512 03/01/24 0847 03/02/24 0341 03/03/24 0413  NA 141 139 138 140 141  K 4.0 4.0 4.4 3.9 4.1  CL 88* 90* 93* 90* 89*  CO2 >45* 41* 31 43* 41*  GLUCOSE 151* 136* 88 123* 96  BUN 11 24* 24* 26* 18  CREATININE 0.42* 0.42* 0.38* 0.37* 0.44*  CALCIUM  8.5* 9.0 8.6* 9.1 9.2  MG 1.8 1.6* 2.0 1.7 1.8  PHOS 3.9  --   --   --   --    GFR: Estimated Creatinine Clearance: 59 mL/min (A) (by C-G formula based on SCr of 0.44 mg/dL (L)). Liver Function Tests: No results for input(s): AST, ALT, ALKPHOS, BILITOT, PROT, ALBUMIN in the last 168 hours. No  results for input(s): LIPASE, AMYLASE in the last 168 hours. No results for input(s): AMMONIA in the last 168 hours. Coagulation Profile: No results for input(s): INR, PROTIME in the last 168 hours. Cardiac Enzymes: No results for input(s): CKTOTAL, CKMB, CKMBINDEX, TROPONINI in the last 168 hours. BNP (last 3 results) No results for input(s): PROBNP in the last 8760 hours. HbA1C: No results for input(s): HGBA1C in the last 72 hours. CBG: Recent Labs  Lab 03/02/24 0346 03/02/24 2057 03/02/24 2348 03/03/24 0411 03/03/24 0847  GLUCAP 128* 163* 87 86 84   Lipid Profile: No results for input(s): CHOL, HDL, LDLCALC, TRIG, CHOLHDL, LDLDIRECT in the last 72 hours. Thyroid  Function Tests: No results for input(s): TSH, T4TOTAL, FREET4, T3FREE, THYROIDAB in the last 72 hours. Anemia Panel: No results for input(s): VITAMINB12, FOLATE, FERRITIN, TIBC, IRON, RETICCTPCT in the last 72 hours. Sepsis Labs: No results for input(s): PROCALCITON, LATICACIDVEN in the last  168 hours.  Recent Results (from the past 240 hours)  Culture, blood (Routine X 2) w Reflex to ID Panel     Status: None   Collection Time: 02/22/24 11:50 AM   Specimen: BLOOD  Result Value Ref Range Status   Specimen Description BLOOD LEFT ANTECUBITAL  Final   Special Requests   Final    BOTTLES DRAWN AEROBIC AND ANAEROBIC Blood Culture results may not be optimal due to an inadequate volume of blood received in culture bottles   Culture   Final    NO GROWTH 6 DAYS Performed at [facility], [address] [place], [place] [address]    Report Status 02/28/2024 FINAL  Final  Culture, blood (Routine X 2) w Reflex to ID Panel     Status: None   Collection Time: 02/22/24  3:13 PM   Specimen: BLOOD LEFT ARM  Result Value Ref Range Status   Specimen Description BLOOD LEFT ARM  Final   Special Requests   Final    BOTTLES DRAWN AEROBIC AND ANAEROBIC Blood Culture  adequate volume   Culture   Final    NO GROWTH 6 DAYS Performed at [facility], [address], [place], [place] [address]    Report Status 02/28/2024 FINAL  Final  MRSA Next Gen by PCR, Nasal     Status: None   Collection Time: 02/22/24  4:06 PM   Specimen: Nasal Mucosa; Nasal Swab  Result Value Ref Range Status   MRSA by PCR Next Gen NOT DETECTED NOT DETECTED Final    Comment: (NOTE) The GeneXpert MRSA Assay (FDA approved for NASAL specimens only), is one component of a comprehensive MRSA colonization surveillance program. It is not intended to diagnose MRSA infection nor to guide or monitor treatment for MRSA infections. Test performance is not FDA approved in patients less than 65 years old. Performed at [facility], [address], [place], [place] [address]   SARS Coronavirus 2 by RT PCR (hospital order, performed in [facility] hospital lab) *cepheid single result test* Anterior Nasal Swab     Status: None   Collection Time: 02/24/24 12:17 PM   Specimen: Anterior Nasal Swab  Result Value Ref Range Status   SARS Coronavirus 2 by RT PCR NEGATIVE NEGATIVE Final    Comment: Performed at [facility], [address], [place], [place] [address]  Respiratory (~20 pathogens) panel by PCR     Status: None   Collection Time: 02/24/24 12:17 PM   Specimen: Nasopharyngeal Swab; Respiratory  Result Value Ref Range Status   Adenovirus NOT DETECTED NOT DETECTED Final   Coronavirus 229E NOT DETECTED NOT DETECTED Final    Comment: (NOTE) The Coronavirus on the Respiratory Panel, DOES NOT test for the novel  Coronavirus (2019 nCoV)    Coronavirus HKU1 NOT DETECTED NOT DETECTED Final   Coronavirus NL63 NOT DETECTED NOT DETECTED Final   Coronavirus OC43 NOT DETECTED NOT DETECTED Final   Metapneumovirus NOT DETECTED NOT DETECTED Final   Rhinovirus / Enterovirus NOT DETECTED NOT DETECTED Final   Influenza A NOT DETECTED NOT DETECTED Final   Influenza B NOT DETECTED NOT  DETECTED Final   Parainfluenza Virus 1 NOT DETECTED NOT DETECTED Final   Parainfluenza Virus 2 NOT DETECTED NOT DETECTED Final   Parainfluenza Virus 3 NOT DETECTED NOT DETECTED Final   Parainfluenza Virus 4 NOT DETECTED NOT DETECTED Final   Respiratory Syncytial Virus NOT DETECTED NOT DETECTED Final   Bordetella  pertussis NOT DETECTED NOT DETECTED Final   Bordetella Parapertussis NOT DETECTED NOT DETECTED Final   Chlamydophila pneumoniae NOT DETECTED NOT DETECTED Final   Mycoplasma pneumoniae NOT DETECTED NOT DETECTED Final    Comment: Performed at [facility], [address] [place], [place] [address]         Radiology Studies: No results found.         LOS: 10 days   Time spent= 35 mins    Burgess JAYSON Dare, MD [facility]  If 7PM-7AM, please contact night-coverage  03/03/2024, 10:50 AM

## 2024-03-03 NOTE — Progress Notes (Signed)
 Occupational Therapy Treatment Patient Details Name: Anthony Nelson MRN: [id number] DOB: 11-Oct-1960 Today's Date: 03/03/2024   History of present illness 63 year old male was brought into the emergency department 8/23 after he was found unresponsive by home health aide. Acute on chronic respiratory failure with hypoxia and hypercapnia due to COPD AE. PMH: chronic hypoxic/hypercapnic respiratory failure on home oxygen  due to severe COPD pulmonary hypertension, diastolic heart failure and severe protein calorie malnutrition.   OT comments  Patient is moving better, continues to be limited by poor activity tolerance.  Needs increased time between movements to complete.  Generalized supervision for bed mobility, Min A for sit to stand and CGA for simple transfer to the recliner.  Patient able to change socks sitting EOB with increased time and supervision.  OT wil continue efforts in the acute setting to address deficits, and Patient will benefit from continued inpatient follow up therapy, <3 hours/day.       If plan is discharge home, recommend the following:  A little help with walking and/or transfers;A lot of help with bathing/dressing/bathroom;Assist for transportation;Assistance with cooking/housework;Help with stairs or ramp for entrance   Equipment Recommendations  None recommended by OT    Recommendations for Other Services      Precautions / Restrictions Precautions Precautions: Fall Restrictions Weight Bearing Restrictions Per Provider Order: No       Mobility Bed Mobility Overal bed mobility: Needs Assistance Bed Mobility: Supine to Sit     Supine to sit: Supervision       Patient Response: Cooperative  Transfers Overall transfer level: Needs assistance Equipment used: Rolling walker (2 wheels) Transfers: Sit to/from Stand, Bed to chair/wheelchair/BSC Sit to Stand: Min assist     Step pivot transfers: Contact guard assist           Balance Overall balance  assessment: Needs assistance Sitting-balance support: Feet supported Sitting balance-Leahy Scale: Good     Standing balance support: Reliant on assistive device for balance Standing balance-Leahy Scale: Poor                             ADL either performed or assessed with clinical judgement   ADL       Grooming: Wash/dry hands;Wash/dry face;Set up;Sitting       Lower Body Bathing: Sit to/from stand;With adaptive equipment   Upper Body Dressing : Minimal assistance;Sitting   Lower Body Dressing: Moderate assistance;Sit to/from stand   Toilet Transfer: Minimal assistance;Stand-pivot;Rolling walker (2 wheels)                  Extremity/Trunk Assessment Upper Extremity Assessment Upper Extremity Assessment: Generalized weakness   Lower Extremity Assessment Lower Extremity Assessment: Defer to PT evaluation   Cervical / Trunk Assessment Cervical / Trunk Assessment: Kyphotic    Vision   Vision Assessment?: No apparent visual deficits   Perception Perception Perception: Not tested   Praxis Praxis Praxis: Not tested   Communication Communication Communication: No apparent difficulties   Cognition Arousal: Alert Behavior During Therapy: Flat affect Cognition: No apparent impairments             OT - Cognition Comments: Slow to respond                 Following commands: Intact Following commands impaired: Follows one step commands with increased time      Cueing   Cueing Techniques: Verbal cues, Gestural cues  Exercises      Shoulder  Instructions       General Comments      Pertinent Vitals/ Pain       Pain Assessment Pain Assessment: Faces Faces Pain Scale: No hurt Pain Intervention(s): Monitored during session                                                          Frequency  Min 2X/week        Progress Toward Goals  OT Goals(current goals can now be found in the care plan  section)  Progress towards OT goals: Progressing toward goals  ADL Goals Pt Will Perform Grooming: with supervision;standing Pt Will Perform Upper Body Dressing: with set-up;sitting Pt Will Perform Lower Body Dressing: with supervision;sit to/from stand Pt Will Transfer to Toilet: with supervision;ambulating;regular height toilet  Plan      Co-evaluation                 AM-PAC OT 6 Clicks Daily Activity     Outcome Measure   Help from another person eating meals?: A Little Help from another person taking care of personal grooming?: A Little Help from another person toileting, which includes using toliet, bedpan, or urinal?: A Lot Help from another person bathing (including washing, rinsing, drying)?: A Lot Help from another person to put on and taking off regular upper body clothing?: A Little Help from another person to put on and taking off regular lower body clothing?: A Lot 6 Click Score: 15    End of Session Equipment Utilized During Treatment: Rolling walker (2 wheels);Oxygen   OT Visit Diagnosis: Unsteadiness on feet (R26.81);Muscle weakness (generalized) (M62.81)   Activity Tolerance Patient tolerated treatment well   Patient Left in chair;with call bell/phone within reach;with chair alarm set   Nurse Communication Mobility status        Time: 8482-8460 OT Time Calculation (min): 22 min  Charges: OT General Charges $OT Visit: 1 Visit OT Treatments $Self Care/Home Management : 8-22 mins  03/03/2024  RP, OTR/L  Acute Rehabilitation Services  Office:  [phone]   Anthony Nelson 03/03/2024, 3:57 PM

## 2024-03-03 NOTE — Plan of Care (Signed)

## 2024-03-03 NOTE — Progress Notes (Signed)
 PT Cancellation Note  Patient Details Name: ERLAND VIVAS MRN: [id number] DOB: 04-Feb-1961   Cancelled Treatment:    Reason Eval/Treat Not Completed: Other (comment). Pt being fed lunch by daughter. Pt requested to finish eating. PT to return as able to progress mobility.   Norene Ames, PT, DPT Acute Rehabilitation Services Secure chat preferred Office #: [phone]    Norene CHRISTELLA Ames 03/03/2024, 2:02 PM

## 2024-03-04 DIAGNOSIS — R41841 Cognitive communication deficit: Secondary | ICD-10-CM | POA: Diagnosis not present

## 2024-03-04 DIAGNOSIS — M6281 Muscle weakness (generalized): Secondary | ICD-10-CM | POA: Diagnosis not present

## 2024-03-04 DIAGNOSIS — E43 Unspecified severe protein-calorie malnutrition: Secondary | ICD-10-CM | POA: Diagnosis not present

## 2024-03-04 DIAGNOSIS — L8990 Pressure ulcer of unspecified site, unspecified stage: Secondary | ICD-10-CM | POA: Diagnosis not present

## 2024-03-04 DIAGNOSIS — J9611 Chronic respiratory failure with hypoxia: Secondary | ICD-10-CM | POA: Diagnosis not present

## 2024-03-04 DIAGNOSIS — R262 Difficulty in walking, not elsewhere classified: Secondary | ICD-10-CM | POA: Diagnosis not present

## 2024-03-04 DIAGNOSIS — J9621 Acute and chronic respiratory failure with hypoxia: Secondary | ICD-10-CM | POA: Diagnosis not present

## 2024-03-04 DIAGNOSIS — Z7401 Bed confinement status: Secondary | ICD-10-CM | POA: Diagnosis not present

## 2024-03-04 DIAGNOSIS — Z7189 Other specified counseling: Secondary | ICD-10-CM | POA: Diagnosis not present

## 2024-03-04 DIAGNOSIS — I50812 Chronic right heart failure: Secondary | ICD-10-CM | POA: Diagnosis not present

## 2024-03-04 DIAGNOSIS — R627 Adult failure to thrive: Secondary | ICD-10-CM | POA: Diagnosis not present

## 2024-03-04 DIAGNOSIS — R531 Weakness: Secondary | ICD-10-CM | POA: Diagnosis not present

## 2024-03-04 DIAGNOSIS — F29 Unspecified psychosis not due to a substance or known physiological condition: Secondary | ICD-10-CM | POA: Diagnosis not present

## 2024-03-04 DIAGNOSIS — Z515 Encounter for palliative care: Secondary | ICD-10-CM | POA: Diagnosis not present

## 2024-03-04 DIAGNOSIS — E010 Iodine-deficiency related diffuse (endemic) goiter: Secondary | ICD-10-CM | POA: Diagnosis not present

## 2024-03-04 DIAGNOSIS — J441 Chronic obstructive pulmonary disease with (acute) exacerbation: Secondary | ICD-10-CM | POA: Diagnosis not present

## 2024-03-04 DIAGNOSIS — J9691 Respiratory failure, unspecified with hypoxia: Secondary | ICD-10-CM | POA: Diagnosis not present

## 2024-03-04 DIAGNOSIS — J449 Chronic obstructive pulmonary disease, unspecified: Secondary | ICD-10-CM | POA: Diagnosis not present

## 2024-03-04 DIAGNOSIS — R5381 Other malaise: Secondary | ICD-10-CM | POA: Diagnosis not present

## 2024-03-04 DIAGNOSIS — J9620 Acute and chronic respiratory failure, unspecified whether with hypoxia or hypercapnia: Secondary | ICD-10-CM | POA: Diagnosis not present

## 2024-03-04 DIAGNOSIS — I2723 Pulmonary hypertension due to lung diseases and hypoxia: Secondary | ICD-10-CM | POA: Diagnosis not present

## 2024-03-04 DIAGNOSIS — I5032 Chronic diastolic (congestive) heart failure: Secondary | ICD-10-CM | POA: Diagnosis not present

## 2024-03-04 DIAGNOSIS — E785 Hyperlipidemia, unspecified: Secondary | ICD-10-CM | POA: Diagnosis not present

## 2024-03-04 LAB — BASIC METABOLIC PANEL WITH GFR
Anion gap: 8 (ref 5–15)
BUN: 22 mg/dL (ref 8–23)
CO2: 39 mmol/L — ABNORMAL HIGH (ref 22–32)
Calcium: 8.8 mg/dL — ABNORMAL LOW (ref 8.9–10.3)
Chloride: 90 mmol/L — ABNORMAL LOW (ref 98–111)
Creatinine, Ser: 0.45 mg/dL — ABNORMAL LOW (ref 0.61–1.24)
GFR, Estimated: >60 mL/min (ref >60)
Glucose, Bld: 90 mg/dL (ref 70–99)
Potassium: 3.7 mmol/L (ref 3.5–5.1)
Sodium: 137 mmol/L (ref 135–145)

## 2024-03-04 LAB — CBC
HCT: 29.5 % — ABNORMAL LOW (ref 39.0–52.0)
Hemoglobin: 9.4 g/dL — ABNORMAL LOW (ref 13.0–17.0)
MCH: 32 pg (ref 26.0–34.0)
MCHC: 31.9 g/dL (ref 30.0–36.0)
MCV: 100.3 fL — ABNORMAL HIGH (ref 80.0–100.0)
Platelets: 349 K/uL (ref 150–400)
RBC: 2.94 MIL/uL — ABNORMAL LOW (ref 4.22–5.81)
RDW: 12.3 % (ref 11.5–15.5)
WBC: 9 K/uL (ref 4.0–10.5)
nRBC: 0 % (ref 0.0–0.2)

## 2024-03-04 LAB — GLUCOSE, CAPILLARY
Glucose-Capillary: 132 mg/dL — ABNORMAL HIGH (ref 70–99)
Glucose-Capillary: 84 mg/dL (ref 70–99)
Glucose-Capillary: 85 mg/dL (ref 70–99)
Glucose-Capillary: 85 mg/dL (ref 70–99)

## 2024-03-04 LAB — MAGNESIUM: Magnesium: 1.7 mg/dL (ref 1.7–2.4)

## 2024-03-04 NOTE — TOC Transition Note (Signed)
 Transition of Care [facility]) - Discharge Note   Patient Details  Name: Anthony Nelson MRN: [id number] Date of Birth: 13-Nov-1960  [facility] CM/SW Contact:  Sherline Clack, LCSWA Phone Number: 03/04/2024, 3:46 PM   Clinical Narrative:     Patient will DC to: [facility] Anticipated DC date: 03/04/24 Family notified: Endiah/daughter Transport by: [facility]   Per MD patient ready for DC to [facility]. RN to call report prior to discharge (([phone]), [phone]). RN, patient, patient's family, and facility notified of DC. Discharge Summary and FL2 sent to facility. DC packet on chart. Ambulance transport requested for patient.   CSW will sign off for now as social work intervention is no longer needed. Please consult us  again if new needs arise.    Final next level of care: Skilled Nursing Facility Barriers to Discharge: Barriers Resolved   Patient Goals and CMS Choice   CMS Medicare.gov Compare Post Acute Care list provided to:: Patient Represenative (must comment) Choice offered to / list presented to : Adult Children      Discharge Placement              Patient [facility] bed at: [facility] Patient to be transferred to facility by: [facility] Name of family member notified: Endiah/daughter Patient and family notified of of transfer: 03/04/24  Discharge Plan and Services Additional resources added to the After Visit Summary for     Discharge Planning Services: CM Consult Post Acute Care Choice: Long Term Acute Care (LTAC), Skilled Nursing Facility                               Social Drivers of Health (SDOH) Interventions SDOH Screenings   Food Insecurity: Patient Unable To Answer (02/23/2024)  Housing: Low Risk  (02/28/2024)  Transportation Needs: Patient Unable To Answer (02/23/2024)  Recent Concern: Transportation Needs - Unmet Transportation Needs (01/13/2024)  Utilities: Patient Unable To Answer (02/23/2024)  Alcohol  Screen: Low Risk  (12/26/2022)  Depression (PHQ2-9): Low Risk  (01/16/2023)  Physical Activity: Inactive (07/17/2023)  Social Connections: Moderately Isolated (01/13/2024)  Stress: No Stress Concern Present (12/26/2022)  Tobacco Use: Medium Risk (02/22/2024)     Readmission Risk Interventions    02/24/2024    8:57 AM 07/12/2023    3:16 PM  Readmission Risk Prevention Plan  Transportation Screening Complete Complete  Medication Review (RN Care Manager) Complete Referral to Pharmacy  PCP or Specialist appointment within 3-5 days of discharge Complete Complete  HRI or Home Care Consult Complete   SW Recovery Care/Counseling Consult Complete Complete  Palliative Care Screening  Not Applicable  Skilled Nursing Facility  Not Applicable

## 2024-03-04 NOTE — Discharge Summary (Signed)
 Physician Discharge Summary   Patient: Anthony Nelson MRN: [id number] DOB: [birth year]  Admit date:     02/22/2024  Discharge date: 03/04/24  Discharge Physician: Drue ONEIDA Potter   PCP: [facility]   Recommendations at discharge:  Follow-up with PCP  Discharge Diagnoses: Status post PEA arrest with hypoxia Acute on chronic hypoxic respiratory failure Acute severe COPD exacerbation Septic shock secondary to multifocal pneumonia Acute metabolic toxic encephalopathy, resolved Alkalosis  Severe protein calorie malnutrition Hypomagnesemia/hypokalemia Anemia of chronic disease with macrocytosis Bilateral lower extremity blisters on the left Chronic diastolic CHF      Hospital Course: 63 year old cachectic/malnourished, chronic hypoxia with history of COPD, pulmonary HTN, diastolic CHF comes to the ED initially found to be unresponsive by his home health aide.  Due to concerns of unresponsiveness and inability to protect his airway he was intubated.  Upon arrival to the ICU he went into PEA cardiac arrest and ROSC was achieved in 9 minutes.  Following day he was spiking fever and became hypotensive requiring pressors.  He was empirically started on IV steroids and antibiotics due to concerns of severe COPD exacerbation and multifocal pneumonia. During hospitalization completed course of antibiotics and steroids, slowly weaning down on oxygen .  Initially had cortrak in place due to malnourishment requiring tube feeds but eventually patient and family declined long-term feeding tube.  At this time his best is to encourage him with oral intake and provide supplemental nutrition as necessary. Currently medically stable for discharge to SNF.   Consultants: Palliative care Procedures performed: None Disposition: Skilled nursing facility Diet recommendation:  Cardiac diet DISCHARGE MEDICATION: Allergies as of 03/04/2024       Reactions   Erythromycin Other (See Comments)   Allergy  from childhood- does not take it        Medication List     TAKE these medications    aspirin  EC 81 MG tablet Take 81 mg by mouth daily. Swallow whole.   feeding supplement Liqd Take 237 mLs by mouth 2 (two) times daily between meals.   ipratropium-albuterol  0.5-2.5 (3) MG/3ML Soln Commonly known as: DUONEB Take 3 mLs by nebulization every 4 (four) hours as needed.   midodrine  10 MG tablet Commonly known as: PROAMATINE  Take 1 tablet (10 mg total) by mouth 3 (three) times daily with meals.   multivitamin with minerals Tabs tablet Take 1 tablet by mouth daily.        Contact information for follow-up providers     [facility] Follow up in 1 week(s).   Contact information: [address] [facility] [place] [address] [phone]              Contact information for after-discharge care     Destination     [facility]   Service: Skilled Nursing Contact information: [address] [place]  [address] [phone]                    Discharge Exam: Filed Weights   02/29/24 0120 03/01/24 0500 03/02/24 9386  Weight: 49 kg 49 kg 44.7 kg   General exam: Appears calm and comfortable, cachectic frail on intranasal oxygen  Respiratory system: Clear to auscultation. Respiratory effort normal. Cardiovascular system: S1 & S2 heard, RRR. No JVD, murmurs, rubs, gallops or clicks. No pedal edema. Gastrointestinal system: Abdomen is nondistended, soft and nontender. No organomegaly or masses felt. Normal bowel sounds heard. Central nervous system: Alert and oriented. No focal neurological deficits. Extremities: Symmetric 5  x 5 power. Skin: No rashes, lesions or ulcers Psychiatry: Judgement and insight appear normal. Mood & affect appropriate.  Condition at discharge: good  The results of significant diagnostics from this hospitalization (including imaging, microbiology, ancillary and  laboratory) are listed below for reference.   Imaging Studies: DG CHEST PORT 1 VIEW Result Date: 02/22/2024 CLINICAL DATA:  Central line placement. EXAM: PORTABLE CHEST 1 VIEW COMPARISON:  Prior today FINDINGS: Endotracheal tube and nasogastric tube remain in place. A new left subclavian central venous catheter is seen with tip overlying the proximal SVC. No pneumothorax visualized. Pulmonary hyperinflation is again seen, consistent with COPD. No evidence of pulmonary infiltrate or pleural effusion. IMPRESSION: New left subclavian central venous catheter tip overlies the proximal SVC. No pneumothorax visualized. COPD. No active lung disease. Electronically Signed   By: Norleen DELENA Kil M.D.   On: 02/22/2024 16:56   DG Chest Portable 1 View Result Date: 02/22/2024 CLINICAL DATA:  Status post intubation. EXAM: PORTABLE CHEST 1 VIEW COMPARISON:  Same day chest radiograph dated 02/22/2024 at 12:15 p.m. FINDINGS: Endotracheal tube tip is located 5.7 cm above the carina. Otherwise, no significant change from the prior exam. Similar hyperexpansion with chronic interstitial coarsening. IMPRESSION: Endotracheal tube tip is located 5.7 cm above the carina. Otherwise, no significant change from the prior exam. Electronically Signed   By: Harrietta Sherry M.D.   On: 02/22/2024 15:10   CT Head Wo Contrast Result Date: 02/22/2024 CLINICAL DATA:  Mental status changes. EXAM: CT HEAD WITHOUT CONTRAST TECHNIQUE: Contiguous axial images were obtained from the base of the skull through the vertex without intravenous contrast. RADIATION DOSE REDUCTION: This exam was performed according to the departmental dose-optimization program which includes automated exposure control, adjustment of the mA and/or kV according to patient size and/or use of iterative reconstruction technique. COMPARISON:  07/07/2023 FINDINGS: Brain: There is no evidence for acute hemorrhage, hydrocephalus, mass lesion, or abnormal extra-axial fluid collection.  No definite CT evidence for acute infarction. Vascular: No hyperdense vessel or unexpected calcification. Skull: No evidence for fracture. No worrisome lytic or sclerotic lesion. Sinuses/Orbits: The visualized paranasal sinuses and mastoid air cells are clear. Visualized portions of the globes and intraorbital fat are unremarkable. Other: None. IMPRESSION: No acute intracranial abnormality. Electronically Signed   By: Camellia Candle M.D.   On: 02/22/2024 12:44   DG Chest Port 1 View Result Date: 02/22/2024 CLINICAL DATA:  Questionable sepsis. EXAM: PORTABLE CHEST 1 VIEW COMPARISON:  01/13/2024 FINDINGS: Lungs are markedly hyperexpanded. The lungs are clear without focal pneumonia, edema, pneumothorax or pleural effusion. Interstitial markings are diffusely coarsened with chronic features. The cardiopericardial silhouette is within normal limits for size. No acute bony abnormality. Telemetry leads overlie the chest. IMPRESSION: Hyperexpansion with chronic interstitial coarsening. No acute cardiopulmonary findings. Electronically Signed   By: Camellia Candle M.D.   On: 02/22/2024 12:42    Microbiology: Results for orders placed or performed during the hospital encounter of 02/22/24  Culture, blood (Routine X 2) w Reflex to ID Panel     Status: None   Collection Time: 02/22/24 11:50 AM   Specimen: BLOOD  Result Value Ref Range Status   Specimen Description BLOOD LEFT ANTECUBITAL  Final   Special Requests   Final    BOTTLES DRAWN AEROBIC AND ANAEROBIC Blood Culture results may not be optimal due to an inadequate volume of blood received in culture bottles   Culture   Final    NO GROWTH 6 DAYS Performed at [facility],  [address] [place], [place] [address]    Report Status 02/28/2024 FINAL  Final  Culture, blood (Routine X 2) w Reflex to ID Panel     Status: None   Collection Time: 02/22/24  3:13 PM   Specimen: BLOOD LEFT ARM  Result Value Ref Range Status   Specimen Description BLOOD  LEFT ARM  Final   Special Requests   Final    BOTTLES DRAWN AEROBIC AND ANAEROBIC Blood Culture adequate volume   Culture   Final    NO GROWTH 6 DAYS Performed at [facility], [address], [place], [place] [address]    Report Status 02/28/2024 FINAL  Final  MRSA Next Gen by PCR, Nasal     Status: None   Collection Time: 02/22/24  4:06 PM   Specimen: Nasal Mucosa; Nasal Swab  Result Value Ref Range Status   MRSA by PCR Next Gen NOT DETECTED NOT DETECTED Final    Comment: (NOTE) The GeneXpert MRSA Assay (FDA approved for NASAL specimens only), is one component of a comprehensive MRSA colonization surveillance program. It is not intended to diagnose MRSA infection nor to guide or monitor treatment for MRSA infections. Test performance is not FDA approved in patients less than 41 years old. Performed at [facility], [address] [place], [place] [address]   SARS Coronavirus 2 by RT PCR (hospital order, performed in [facility] hospital lab) *cepheid single result test* Anterior Nasal Swab     Status: None   Collection Time: 02/24/24 12:17 PM   Specimen: Anterior Nasal Swab  Result Value Ref Range Status   SARS Coronavirus 2 by RT PCR NEGATIVE NEGATIVE Final    Comment: Performed at [facility], [address], [place], [place] [address]  Respiratory (~20 pathogens) panel by PCR     Status: None   Collection Time: 02/24/24 12:17 PM   Specimen: Nasopharyngeal Swab; Respiratory  Result Value Ref Range Status   Adenovirus NOT DETECTED NOT DETECTED Final   Coronavirus 229E NOT DETECTED NOT DETECTED Final    Comment: (NOTE) The Coronavirus on the Respiratory Panel, DOES NOT test for the novel  Coronavirus (2019 nCoV)    Coronavirus HKU1 NOT DETECTED NOT DETECTED Final   Coronavirus NL63 NOT DETECTED NOT DETECTED Final   Coronavirus OC43 NOT DETECTED NOT DETECTED Final   Metapneumovirus NOT DETECTED NOT DETECTED Final   Rhinovirus / Enterovirus NOT DETECTED  NOT DETECTED Final   Influenza A NOT DETECTED NOT DETECTED Final   Influenza B NOT DETECTED NOT DETECTED Final   Parainfluenza Virus 1 NOT DETECTED NOT DETECTED Final   Parainfluenza Virus 2 NOT DETECTED NOT DETECTED Final   Parainfluenza Virus 3 NOT DETECTED NOT DETECTED Final   Parainfluenza Virus 4 NOT DETECTED NOT DETECTED Final   Respiratory Syncytial Virus NOT DETECTED NOT DETECTED Final   Bordetella pertussis NOT DETECTED NOT DETECTED Final   Bordetella Parapertussis NOT DETECTED NOT DETECTED Final   Chlamydophila pneumoniae NOT DETECTED NOT DETECTED Final   Mycoplasma pneumoniae NOT DETECTED NOT DETECTED Final    Comment: Performed at [facility], [address] [place], [place] [address]    Labs: CBC: Recent Labs  Lab 02/29/24 [phone] 03/01/24 0847 03/02/24 0341 03/03/24 0413 03/04/24 0206  WBC 9.1 9.4 8.4 8.4 9.0  HGB 9.6* 10.1* 9.4* 10.0* 9.4*  HCT 31.1* 33.6* 29.9* 31.5* 29.5*  MCV 103.3* 107.0* 100.7* 100.6* 100.3*  PLT 173 196 285 343 349  Basic Metabolic Panel: Recent Labs  Lab 02/28/24 0406 02/29/24 0512 03/01/24 0847 03/02/24 0341 03/03/24 0413 03/04/24 0206  NA 141 139 138 140 141 137  K 4.0 4.0 4.4 3.9 4.1 3.7  CL 88* 90* 93* 90* 89* 90*  CO2 >45* 41* 31 43* 41* 39*  GLUCOSE 151* 136* 88 123* 96 90  BUN 11 24* 24* 26* 18 22  CREATININE 0.42* 0.42* 0.38* 0.37* 0.44* 0.45*  CALCIUM  8.5* 9.0 8.6* 9.1 9.2 8.8*  MG 1.8 1.6* 2.0 1.7 1.8 1.7  PHOS 3.9  --   --   --   --   --    Liver Function Tests: No results for input(s): AST, ALT, ALKPHOS, BILITOT, PROT, ALBUMIN in the last 168 hours. CBG: Recent Labs  Lab 03/03/24 2210 03/04/24 0029 03/04/24 0445 03/04/24 0747 03/04/24 1232  GLUCAP 116* 85 84 85 132*    Discharge time spent:  34 minutes.  Signed: Drue ONEIDA Potter, MD [facility] 03/04/2024

## 2024-03-04 NOTE — Progress Notes (Signed)
 Progress Note   Patient: Anthony Nelson FMW:[id number] DOB: January 02, 1961 DOA: 02/22/2024     11 DOS: the patient was seen and examined on 03/04/2024    Brief Narrative:  63 year old cachectic/malnourished, chronic hypoxia with history of COPD, pulmonary HTN, diastolic CHF comes to the ED initially found to be unresponsive by his home health aide.  Due to concerns of unresponsiveness and inability to protect his airway he was intubated.  Upon arrival to the ICU he went into PEA cardiac arrest and ROSC was achieved in 9 minutes.  Following day he was spiking fever and became hypotensive requiring pressors.  He was empirically started on IV steroids and antibiotics due to concerns of severe COPD exacerbation and multifocal pneumonia. During hospitalization completed course of antibiotics and steroids, slowly weaning down on oxygen .  Initially had cortrak in place due to malnourishment requiring tube feeds but eventually patient and family declined long-term feeding tube.  At this time his best is to encourage him with oral intake and provide supplemental nutrition as necessary. Currently medically stable for discharge to SNF.   Assessment & Plan:  Status post PEA arrest with hypoxia - Required 9 minutes of CPR thereafter ROSC was achieved.  Initially required pressors, now on midodrine .  Now remains stable   Acute on chronic hypoxic respiratory failure Acute severe COPD exacerbation Septic shock secondary to multifocal pneumonia - Flu, COVID, RSV negative.  Patient is now improving after extubation and weaned off BiPAP.  Has completed 7 days course of antibiotics Continue current oxygen  requirement  Acute metabolic toxic encephalopathy, resolved - Resolved.    Alkalosis - Somewhat chronic.  Improved with one dose of Diamox .   Severe protein calorie malnutrition - Patient is not interested in long-term feeding tube.  Discussed with family as well.  Cortrak removed    Hypomagnesemia/hypokalemia - As needed repletion.   Anemia of chronic disease with macrocytosis Monitor CBC   Bilateral lower extremity blisters on the left Resolved with short course of vancomycin  and also received Rocephin    Chronic diastolic CHF - Overall appears to be stable.  Holding diuretics Monitor daily weight     DVT prophylaxis: Subcu heparin       Code Status: Limited: Do not attempt resuscitation (DNR) -DNR-LIMITED -Do Not Intubate/DNI   Family Communication: No family at bedside today Status is: Inpatient Remains inpatient appropriate because: SNF placement, pending placement   PT Follow up Recs: Skilled Nursing-Short Term Rehab    Subjective:  Patient seen and examined at bedside this morning requiring 2 L of intranasal oxygen  He admits to improvement in respiratory function Denies chest pain headache nausea vomiting or abdominal pain TOC still working on placement  General exam: Appears calm and comfortable, cachectic frail on intranasal oxygen  Respiratory system: Clear to auscultation. Respiratory effort normal. Cardiovascular system: S1 & S2 heard, RRR. No JVD, murmurs, rubs, gallops or clicks. No pedal edema. Gastrointestinal system: Abdomen is nondistended, soft and nontender. No organomegaly or masses felt. Normal bowel sounds heard. Central nervous system: Alert and oriented. No focal neurological deficits. Extremities: Symmetric 5 x 5 power. Skin: No rashes, lesions or ulcers Psychiatry: Judgement and insight appear normal. Mood & affect appropriate.     Data Reviewed:     Latest Ref Rng & Units 03/04/2024    2:06 AM 03/03/2024    4:13 AM 03/02/2024    3:41 AM  CBC  WBC 4.0 - 10.5 K/uL 9.0  8.4  8.4   Hemoglobin 13.0 - 17.0 g/dL 9.4  10.0  9.4   Hematocrit 39.0 - 52.0 % 29.5  31.5  29.9   Platelets 150 - 400 K/uL 349  343  285        Latest Ref Rng & Units 03/04/2024    2:06 AM 03/03/2024    4:13 AM 03/02/2024    3:41 AM  BMP  Glucose 70 - 99  mg/dL 90  96  876   BUN 8 - 23 mg/dL 22  18  26    Creatinine 0.61 - 1.24 mg/dL 9.54  9.55  9.62   Sodium 135 - 145 mmol/L 137  141  140   Potassium 3.5 - 5.1 mmol/L 3.7  4.1  3.9   Chloride 98 - 111 mmol/L 90  89  90   CO2 22 - 32 mmol/L 39  41  43   Calcium  8.9 - 10.3 mg/dL 8.8  9.2  9.1     Vitals:   03/04/24 0029 03/04/24 0444 03/04/24 0746 03/04/24 0748  BP: 101/69 105/69  101/63  Pulse: 77 94  87  Resp: 18 17    Temp: 98.2 F (36.8 C) 98.4 F (36.9 C)    TempSrc:      SpO2: 100% 100% 100% 100%  Weight:      Height:          Author: Drue ONEIDA Potter, MD 03/04/2024 1:23 PM  For on call review [email or web address].

## 2024-03-04 NOTE — TOC Progression Note (Signed)
 Transition of Care [facility]) - Progression Note    Patient Details  Name: Anthony Nelson MRN: [id number] Date of Birth: 1961/04/28  Transition of Care [facility]) CM/SW Contact  Gwenith Tschida A [place], LCSW Phone Number: 03/04/2024, 10:47 AM  Clinical Narrative:     CSW contacted [facility] regarding pt's authorization status. Authorization is currently pending. Auth ID: [id number].   CSW to upload new clinicals when available. MD notified.  CSW will continue to follow.   Expected Discharge Plan: Long Term Acute Care (LTAC) Barriers to Discharge: Continued Medical Work up               Expected Discharge Plan and Services   Discharge Planning Services: CM Consult Post Acute Care Choice: Long Term Acute Care (LTAC), Skilled Nursing Facility Living arrangements for the past 2 months: Single Family Home Expected Discharge Date: 03/02/24                                     Social Drivers of Health (SDOH) Interventions SDOH Screenings   Food Insecurity: Patient Unable To Answer (02/23/2024)  Housing: Low Risk  (02/28/2024)  Transportation Needs: Patient Unable To Answer (02/23/2024)  Recent Concern: Transportation Needs - Unmet Transportation Needs (01/13/2024)  Utilities: Patient Unable To Answer (02/23/2024)  Alcohol Screen: Low Risk  (12/26/2022)  Depression (PHQ2-9): Low Risk  (01/16/2023)  Physical Activity: Inactive (07/17/2023)  Social Connections: Moderately Isolated (01/13/2024)  Stress: No Stress Concern Present (12/26/2022)  Tobacco Use: Medium Risk (02/22/2024)    Readmission Risk Interventions    02/24/2024    8:57 AM 07/12/2023    3:16 PM  Readmission Risk Prevention Plan  Transportation Screening Complete Complete  Medication Review (RN Care Manager) Complete Referral to Pharmacy  PCP or Specialist appointment within 3-5 days of discharge Complete Complete  HRI or Home Care Consult Complete   SW Recovery Care/Counseling Consult Complete Complete   Palliative Care Screening  Not Applicable  Skilled Nursing Facility  Not Applicable

## 2024-03-04 NOTE — Progress Notes (Signed)
 Physical Therapy Treatment Patient Details Name: Anthony Nelson MRN: [id number] DOB: 1961/01/03 Today's Date: 03/04/2024   History of Present Illness 63 year old male was brought into the emergency department 8/23 after he was found unresponsive by home health aide. Acute on chronic respiratory failure with hypoxia and hypercapnia due to COPD AE. PMH: chronic hypoxic/hypercapnic respiratory failure on home oxygen  due to severe COPD pulmonary hypertension, diastolic heart failure and severe protein calorie malnutrition.    PT Comments  Patient progressing slowly due to weakness, activity intolerance and reported difficulty getting in deep breath after eating.  Patient participating with LE therex this session and walking a few steps chair to bed while changing bed pad due to soiled.  Patient remains appropriate for skilled PT while admitted, recommend inpatient rehab (<3 hours/day) at d/c.     If plan is discharge home, recommend the following: A lot of help with walking and/or transfers;A lot of help with bathing/dressing/bathroom;Assistance with cooking/housework;Assist for transportation;Help with stairs or ramp for entrance   Can travel by private vehicle     No  Equipment Recommendations  None recommended by PT    Recommendations for Other Services       Precautions / Restrictions Precautions Precautions: Fall Precaution/Restrictions Comments: monitor , on 3LO2     Mobility  Bed Mobility Overal bed mobility: Needs Assistance Bed Mobility: Supine to Sit, Sit to Supine     Supine to sit: Contact guard, HOB elevated Sit to supine: Contact guard assist   General bed mobility comments: assist when scooting forward; to supine assist for positioning    Transfers Overall transfer level: Needs assistance Equipment used: Rolling walker (2 wheels) Transfers: Sit to/from Stand, Bed to chair/wheelchair/BSC Sit to Stand: Min assist   Step pivot transfers: Min assist       General  transfer comment: cues for technique, assist for balance and stepping to chair assist for positioning with cues    Ambulation/Gait Ambulation/Gait assistance: Min assist Gait Distance (Feet): 3 Feet Assistive device: Rolling walker (2 wheels) Gait Pattern/deviations: Step-to pattern, Decreased stride length, Trunk flexed       General Gait Details: stepping from chair up to top of bed with cues for positioning and A for walker safety   Stairs             Wheelchair Mobility     Tilt Bed    Modified Rankin (Stroke Patients Only)       Balance Overall balance assessment: Needs assistance Sitting-balance support: Feet supported Sitting balance-Leahy Scale: Fair     Standing balance support: Bilateral upper extremity supported Standing balance-Leahy Scale: Poor Standing balance comment: UE reliant for balance                            Communication Communication Communication: No apparent difficulties  Cognition Arousal: Alert Behavior During Therapy: Flat affect   PT - Cognitive impairments: Attention, Safety/Judgement, Memory, Problem solving                       PT - Cognition Comments: slow processing, limited attention to surrounding, internal distraction with symptoms Following commands: Impaired Following commands impaired: Only follows one step commands consistently, Follows one step commands with increased time    Cueing Cueing Techniques: Verbal cues, Gestural cues  Exercises Other Exercises Other Exercises: seated marching x 5 each leg Other Exercises: sit<>stand x 3 reps with min A    General Comments  General comments (skin integrity, edema, etc.): on 3L [place] with SpO2 92%, HR 111- 117 with mobility; BP stable in sitting, family present in session      Pertinent Vitals/Pain Pain Assessment Faces Pain Scale: Hurts a little bit Pain Location: abdomen (full, hard to get in deep breath) Pain Descriptors / Indicators:  Tightness Pain Intervention(s): Monitored during session, Repositioned    Home Living                          Prior Function            PT Goals (current goals can now be found in the care plan section) Progress towards PT goals: Progressing toward goals    Frequency    Min 2X/week      PT Plan      Co-evaluation              AM-PAC PT 6 Clicks Mobility   Outcome Measure  Help needed turning from your back to your side while in a flat bed without using bedrails?: A Little Help needed moving from lying on your back to sitting on the side of a flat bed without using bedrails?: A Little Help needed moving to and from a bed to a chair (including a wheelchair)?: A Little Help needed standing up from a chair using your arms (e.g., wheelchair or bedside chair)?: A Little Help needed to walk in hospital room?: Total Help needed climbing 3-5 steps with a railing? : Total 6 Click Score: 14    End of Session Equipment Utilized During Treatment: Gait belt;Oxygen  Activity Tolerance: Patient limited by fatigue Patient left: in bed;with family/visitor present;with call bell/phone within reach   PT Visit Diagnosis: Unsteadiness on feet (R26.81);Muscle weakness (generalized) (M62.81);Difficulty in walking, not elsewhere classified (R26.2)     Time: 8640-8577 PT Time Calculation (min) (ACUTE ONLY): 23 min  Charges:    $Therapeutic Exercise: 8-22 mins $Therapeutic Activity: 8-22 mins PT General Charges $$ ACUTE PT VISIT: 1 Visit                     Micheline Nelson, PT Acute Rehabilitation Services Office:[phone] 03/04/2024    Anthony Nelson 03/04/2024, 2:49 PM

## 2024-03-05 DIAGNOSIS — E010 Iodine-deficiency related diffuse (endemic) goiter: Secondary | ICD-10-CM | POA: Diagnosis not present

## 2024-03-05 DIAGNOSIS — J441 Chronic obstructive pulmonary disease with (acute) exacerbation: Secondary | ICD-10-CM | POA: Diagnosis not present

## 2024-03-05 DIAGNOSIS — J9691 Respiratory failure, unspecified with hypoxia: Secondary | ICD-10-CM | POA: Diagnosis not present

## 2024-03-05 DIAGNOSIS — I2723 Pulmonary hypertension due to lung diseases and hypoxia: Secondary | ICD-10-CM | POA: Diagnosis not present

## 2024-03-05 DIAGNOSIS — R5381 Other malaise: Secondary | ICD-10-CM | POA: Diagnosis not present

## 2024-03-05 DIAGNOSIS — J9611 Chronic respiratory failure with hypoxia: Secondary | ICD-10-CM | POA: Diagnosis not present

## 2024-03-05 DIAGNOSIS — E43 Unspecified severe protein-calorie malnutrition: Secondary | ICD-10-CM | POA: Diagnosis not present

## 2024-03-05 DIAGNOSIS — L8990 Pressure ulcer of unspecified site, unspecified stage: Secondary | ICD-10-CM | POA: Diagnosis not present

## 2024-03-05 DIAGNOSIS — I50812 Chronic right heart failure: Secondary | ICD-10-CM | POA: Diagnosis not present

## 2024-03-05 DIAGNOSIS — I5032 Chronic diastolic (congestive) heart failure: Secondary | ICD-10-CM | POA: Diagnosis not present

## 2024-03-05 DIAGNOSIS — J9620 Acute and chronic respiratory failure, unspecified whether with hypoxia or hypercapnia: Secondary | ICD-10-CM | POA: Diagnosis not present

## 2024-03-05 DIAGNOSIS — E785 Hyperlipidemia, unspecified: Secondary | ICD-10-CM | POA: Diagnosis not present

## 2024-03-06 DIAGNOSIS — J441 Chronic obstructive pulmonary disease with (acute) exacerbation: Secondary | ICD-10-CM | POA: Diagnosis not present

## 2024-03-06 DIAGNOSIS — J9611 Chronic respiratory failure with hypoxia: Secondary | ICD-10-CM | POA: Diagnosis not present

## 2024-03-06 DIAGNOSIS — J9620 Acute and chronic respiratory failure, unspecified whether with hypoxia or hypercapnia: Secondary | ICD-10-CM | POA: Diagnosis not present

## 2024-03-06 DIAGNOSIS — J9691 Respiratory failure, unspecified with hypoxia: Secondary | ICD-10-CM | POA: Diagnosis not present

## 2024-03-06 DIAGNOSIS — I50812 Chronic right heart failure: Secondary | ICD-10-CM | POA: Diagnosis not present

## 2024-03-06 DIAGNOSIS — R5381 Other malaise: Secondary | ICD-10-CM | POA: Diagnosis not present

## 2024-03-06 DIAGNOSIS — L8990 Pressure ulcer of unspecified site, unspecified stage: Secondary | ICD-10-CM | POA: Diagnosis not present

## 2024-03-06 DIAGNOSIS — E43 Unspecified severe protein-calorie malnutrition: Secondary | ICD-10-CM | POA: Diagnosis not present

## 2024-03-06 DIAGNOSIS — E010 Iodine-deficiency related diffuse (endemic) goiter: Secondary | ICD-10-CM | POA: Diagnosis not present

## 2024-03-09 DIAGNOSIS — E010 Iodine-deficiency related diffuse (endemic) goiter: Secondary | ICD-10-CM | POA: Diagnosis not present

## 2024-03-09 DIAGNOSIS — J441 Chronic obstructive pulmonary disease with (acute) exacerbation: Secondary | ICD-10-CM | POA: Diagnosis not present

## 2024-03-09 DIAGNOSIS — I50812 Chronic right heart failure: Secondary | ICD-10-CM | POA: Diagnosis not present

## 2024-03-09 DIAGNOSIS — J9691 Respiratory failure, unspecified with hypoxia: Secondary | ICD-10-CM | POA: Diagnosis not present

## 2024-03-09 DIAGNOSIS — R5381 Other malaise: Secondary | ICD-10-CM | POA: Diagnosis not present

## 2024-03-09 DIAGNOSIS — L8990 Pressure ulcer of unspecified site, unspecified stage: Secondary | ICD-10-CM | POA: Diagnosis not present

## 2024-03-09 DIAGNOSIS — J9620 Acute and chronic respiratory failure, unspecified whether with hypoxia or hypercapnia: Secondary | ICD-10-CM | POA: Diagnosis not present

## 2024-03-09 DIAGNOSIS — E43 Unspecified severe protein-calorie malnutrition: Secondary | ICD-10-CM | POA: Diagnosis not present

## 2024-03-09 DIAGNOSIS — J9611 Chronic respiratory failure with hypoxia: Secondary | ICD-10-CM | POA: Diagnosis not present

## 2024-03-10 DIAGNOSIS — E785 Hyperlipidemia, unspecified: Secondary | ICD-10-CM | POA: Diagnosis not present

## 2024-03-10 DIAGNOSIS — E43 Unspecified severe protein-calorie malnutrition: Secondary | ICD-10-CM | POA: Diagnosis not present

## 2024-03-10 DIAGNOSIS — R5381 Other malaise: Secondary | ICD-10-CM | POA: Diagnosis not present

## 2024-03-10 DIAGNOSIS — J441 Chronic obstructive pulmonary disease with (acute) exacerbation: Secondary | ICD-10-CM | POA: Diagnosis not present

## 2024-03-10 DIAGNOSIS — I2723 Pulmonary hypertension due to lung diseases and hypoxia: Secondary | ICD-10-CM | POA: Diagnosis not present

## 2024-03-10 DIAGNOSIS — I50812 Chronic right heart failure: Secondary | ICD-10-CM | POA: Diagnosis not present

## 2024-03-10 DIAGNOSIS — L8990 Pressure ulcer of unspecified site, unspecified stage: Secondary | ICD-10-CM | POA: Diagnosis not present

## 2024-03-10 DIAGNOSIS — I5032 Chronic diastolic (congestive) heart failure: Secondary | ICD-10-CM | POA: Diagnosis not present

## 2024-03-10 DIAGNOSIS — J9691 Respiratory failure, unspecified with hypoxia: Secondary | ICD-10-CM | POA: Diagnosis not present

## 2024-03-10 DIAGNOSIS — J9611 Chronic respiratory failure with hypoxia: Secondary | ICD-10-CM | POA: Diagnosis not present

## 2024-03-10 DIAGNOSIS — J9620 Acute and chronic respiratory failure, unspecified whether with hypoxia or hypercapnia: Secondary | ICD-10-CM | POA: Diagnosis not present

## 2024-03-10 DIAGNOSIS — E010 Iodine-deficiency related diffuse (endemic) goiter: Secondary | ICD-10-CM | POA: Diagnosis not present

## 2024-03-11 DIAGNOSIS — E010 Iodine-deficiency related diffuse (endemic) goiter: Secondary | ICD-10-CM | POA: Diagnosis not present

## 2024-03-11 DIAGNOSIS — J9620 Acute and chronic respiratory failure, unspecified whether with hypoxia or hypercapnia: Secondary | ICD-10-CM | POA: Diagnosis not present

## 2024-03-11 DIAGNOSIS — J9611 Chronic respiratory failure with hypoxia: Secondary | ICD-10-CM | POA: Diagnosis not present

## 2024-03-11 DIAGNOSIS — J441 Chronic obstructive pulmonary disease with (acute) exacerbation: Secondary | ICD-10-CM | POA: Diagnosis not present

## 2024-03-11 DIAGNOSIS — R5381 Other malaise: Secondary | ICD-10-CM | POA: Diagnosis not present

## 2024-03-11 DIAGNOSIS — L8990 Pressure ulcer of unspecified site, unspecified stage: Secondary | ICD-10-CM | POA: Diagnosis not present

## 2024-03-11 DIAGNOSIS — J9691 Respiratory failure, unspecified with hypoxia: Secondary | ICD-10-CM | POA: Diagnosis not present

## 2024-03-11 DIAGNOSIS — E43 Unspecified severe protein-calorie malnutrition: Secondary | ICD-10-CM | POA: Diagnosis not present

## 2024-03-11 DIAGNOSIS — I50812 Chronic right heart failure: Secondary | ICD-10-CM | POA: Diagnosis not present

## 2024-03-12 DIAGNOSIS — R531 Weakness: Secondary | ICD-10-CM | POA: Diagnosis not present

## 2024-03-12 DIAGNOSIS — Z515 Encounter for palliative care: Secondary | ICD-10-CM | POA: Diagnosis not present

## 2024-03-12 DIAGNOSIS — R627 Adult failure to thrive: Secondary | ICD-10-CM | POA: Diagnosis not present

## 2024-03-12 DIAGNOSIS — J449 Chronic obstructive pulmonary disease, unspecified: Secondary | ICD-10-CM | POA: Diagnosis not present

## 2024-03-12 DIAGNOSIS — J9621 Acute and chronic respiratory failure with hypoxia: Secondary | ICD-10-CM | POA: Diagnosis not present

## 2024-03-12 DIAGNOSIS — E43 Unspecified severe protein-calorie malnutrition: Secondary | ICD-10-CM | POA: Diagnosis not present

## 2024-03-12 DIAGNOSIS — R5381 Other malaise: Secondary | ICD-10-CM | POA: Diagnosis not present

## 2024-03-12 DIAGNOSIS — Z7189 Other specified counseling: Secondary | ICD-10-CM | POA: Diagnosis not present

## 2024-03-13 DIAGNOSIS — L8990 Pressure ulcer of unspecified site, unspecified stage: Secondary | ICD-10-CM | POA: Diagnosis not present

## 2024-03-13 DIAGNOSIS — J9620 Acute and chronic respiratory failure, unspecified whether with hypoxia or hypercapnia: Secondary | ICD-10-CM | POA: Diagnosis not present

## 2024-03-13 DIAGNOSIS — E43 Unspecified severe protein-calorie malnutrition: Secondary | ICD-10-CM | POA: Diagnosis not present

## 2024-03-13 DIAGNOSIS — I50812 Chronic right heart failure: Secondary | ICD-10-CM | POA: Diagnosis not present

## 2024-03-13 DIAGNOSIS — J9611 Chronic respiratory failure with hypoxia: Secondary | ICD-10-CM | POA: Diagnosis not present

## 2024-03-13 DIAGNOSIS — R5381 Other malaise: Secondary | ICD-10-CM | POA: Diagnosis not present

## 2024-03-13 DIAGNOSIS — E010 Iodine-deficiency related diffuse (endemic) goiter: Secondary | ICD-10-CM | POA: Diagnosis not present

## 2024-03-13 DIAGNOSIS — J441 Chronic obstructive pulmonary disease with (acute) exacerbation: Secondary | ICD-10-CM | POA: Diagnosis not present

## 2024-03-13 DIAGNOSIS — J9691 Respiratory failure, unspecified with hypoxia: Secondary | ICD-10-CM | POA: Diagnosis not present

## 2024-03-14 DIAGNOSIS — R41841 Cognitive communication deficit: Secondary | ICD-10-CM | POA: Diagnosis not present

## 2024-03-14 DIAGNOSIS — J441 Chronic obstructive pulmonary disease with (acute) exacerbation: Secondary | ICD-10-CM | POA: Diagnosis not present

## 2024-03-14 DIAGNOSIS — J9611 Chronic respiratory failure with hypoxia: Secondary | ICD-10-CM | POA: Diagnosis not present

## 2024-03-14 DIAGNOSIS — R5381 Other malaise: Secondary | ICD-10-CM | POA: Diagnosis not present

## 2024-03-14 DIAGNOSIS — R262 Difficulty in walking, not elsewhere classified: Secondary | ICD-10-CM | POA: Diagnosis not present

## 2024-03-14 DIAGNOSIS — J9691 Respiratory failure, unspecified with hypoxia: Secondary | ICD-10-CM | POA: Diagnosis not present

## 2024-03-14 DIAGNOSIS — J9620 Acute and chronic respiratory failure, unspecified whether with hypoxia or hypercapnia: Secondary | ICD-10-CM | POA: Diagnosis not present

## 2024-03-14 DIAGNOSIS — L8990 Pressure ulcer of unspecified site, unspecified stage: Secondary | ICD-10-CM | POA: Diagnosis not present

## 2024-03-14 DIAGNOSIS — M6281 Muscle weakness (generalized): Secondary | ICD-10-CM | POA: Diagnosis not present

## 2024-03-16 DIAGNOSIS — J9621 Acute and chronic respiratory failure with hypoxia: Secondary | ICD-10-CM | POA: Diagnosis not present

## 2024-03-16 DIAGNOSIS — J9611 Chronic respiratory failure with hypoxia: Secondary | ICD-10-CM | POA: Diagnosis not present

## 2024-03-16 DIAGNOSIS — Z515 Encounter for palliative care: Secondary | ICD-10-CM | POA: Diagnosis not present

## 2024-03-16 DIAGNOSIS — J9691 Respiratory failure, unspecified with hypoxia: Secondary | ICD-10-CM | POA: Diagnosis not present

## 2024-03-16 DIAGNOSIS — J449 Chronic obstructive pulmonary disease, unspecified: Secondary | ICD-10-CM | POA: Diagnosis not present

## 2024-03-16 DIAGNOSIS — R627 Adult failure to thrive: Secondary | ICD-10-CM | POA: Diagnosis not present

## 2024-03-16 DIAGNOSIS — R5381 Other malaise: Secondary | ICD-10-CM | POA: Diagnosis not present

## 2024-03-16 DIAGNOSIS — J9620 Acute and chronic respiratory failure, unspecified whether with hypoxia or hypercapnia: Secondary | ICD-10-CM | POA: Diagnosis not present

## 2024-03-16 DIAGNOSIS — J441 Chronic obstructive pulmonary disease with (acute) exacerbation: Secondary | ICD-10-CM | POA: Diagnosis not present

## 2024-03-16 DIAGNOSIS — E43 Unspecified severe protein-calorie malnutrition: Secondary | ICD-10-CM | POA: Diagnosis not present

## 2024-03-16 DIAGNOSIS — R41841 Cognitive communication deficit: Secondary | ICD-10-CM | POA: Diagnosis not present

## 2024-03-16 DIAGNOSIS — R262 Difficulty in walking, not elsewhere classified: Secondary | ICD-10-CM | POA: Diagnosis not present

## 2024-03-16 DIAGNOSIS — L8990 Pressure ulcer of unspecified site, unspecified stage: Secondary | ICD-10-CM | POA: Diagnosis not present

## 2024-03-16 DIAGNOSIS — E010 Iodine-deficiency related diffuse (endemic) goiter: Secondary | ICD-10-CM | POA: Diagnosis not present

## 2024-03-16 DIAGNOSIS — R531 Weakness: Secondary | ICD-10-CM | POA: Diagnosis not present

## 2024-03-16 DIAGNOSIS — I50812 Chronic right heart failure: Secondary | ICD-10-CM | POA: Diagnosis not present

## 2024-03-16 DIAGNOSIS — M6281 Muscle weakness (generalized): Secondary | ICD-10-CM | POA: Diagnosis not present

## 2024-03-17 DIAGNOSIS — R262 Difficulty in walking, not elsewhere classified: Secondary | ICD-10-CM | POA: Diagnosis not present

## 2024-03-17 DIAGNOSIS — M6281 Muscle weakness (generalized): Secondary | ICD-10-CM | POA: Diagnosis not present

## 2024-03-17 DIAGNOSIS — J9611 Chronic respiratory failure with hypoxia: Secondary | ICD-10-CM | POA: Diagnosis not present

## 2024-03-17 DIAGNOSIS — R41841 Cognitive communication deficit: Secondary | ICD-10-CM | POA: Diagnosis not present

## 2024-03-18 DIAGNOSIS — R5381 Other malaise: Secondary | ICD-10-CM | POA: Diagnosis not present

## 2024-03-18 DIAGNOSIS — R262 Difficulty in walking, not elsewhere classified: Secondary | ICD-10-CM | POA: Diagnosis not present

## 2024-03-18 DIAGNOSIS — J9620 Acute and chronic respiratory failure, unspecified whether with hypoxia or hypercapnia: Secondary | ICD-10-CM | POA: Diagnosis not present

## 2024-03-18 DIAGNOSIS — M6281 Muscle weakness (generalized): Secondary | ICD-10-CM | POA: Diagnosis not present

## 2024-03-18 DIAGNOSIS — J9691 Respiratory failure, unspecified with hypoxia: Secondary | ICD-10-CM | POA: Diagnosis not present

## 2024-03-18 DIAGNOSIS — I50812 Chronic right heart failure: Secondary | ICD-10-CM | POA: Diagnosis not present

## 2024-03-18 DIAGNOSIS — R41841 Cognitive communication deficit: Secondary | ICD-10-CM | POA: Diagnosis not present

## 2024-03-18 DIAGNOSIS — E43 Unspecified severe protein-calorie malnutrition: Secondary | ICD-10-CM | POA: Diagnosis not present

## 2024-03-18 DIAGNOSIS — L8990 Pressure ulcer of unspecified site, unspecified stage: Secondary | ICD-10-CM | POA: Diagnosis not present

## 2024-03-18 DIAGNOSIS — J441 Chronic obstructive pulmonary disease with (acute) exacerbation: Secondary | ICD-10-CM | POA: Diagnosis not present

## 2024-03-18 DIAGNOSIS — E010 Iodine-deficiency related diffuse (endemic) goiter: Secondary | ICD-10-CM | POA: Diagnosis not present

## 2024-03-18 DIAGNOSIS — J9611 Chronic respiratory failure with hypoxia: Secondary | ICD-10-CM | POA: Diagnosis not present

## 2024-03-19 DIAGNOSIS — J9620 Acute and chronic respiratory failure, unspecified whether with hypoxia or hypercapnia: Secondary | ICD-10-CM | POA: Diagnosis not present

## 2024-03-19 DIAGNOSIS — J9611 Chronic respiratory failure with hypoxia: Secondary | ICD-10-CM | POA: Diagnosis not present

## 2024-03-19 DIAGNOSIS — R5381 Other malaise: Secondary | ICD-10-CM | POA: Diagnosis not present

## 2024-03-19 DIAGNOSIS — J9691 Respiratory failure, unspecified with hypoxia: Secondary | ICD-10-CM | POA: Diagnosis not present

## 2024-03-19 DIAGNOSIS — J441 Chronic obstructive pulmonary disease with (acute) exacerbation: Secondary | ICD-10-CM | POA: Diagnosis not present

## 2024-03-19 DIAGNOSIS — E43 Unspecified severe protein-calorie malnutrition: Secondary | ICD-10-CM | POA: Diagnosis not present

## 2024-03-19 DIAGNOSIS — E010 Iodine-deficiency related diffuse (endemic) goiter: Secondary | ICD-10-CM | POA: Diagnosis not present

## 2024-03-19 DIAGNOSIS — L8990 Pressure ulcer of unspecified site, unspecified stage: Secondary | ICD-10-CM | POA: Diagnosis not present

## 2024-03-19 DIAGNOSIS — I50812 Chronic right heart failure: Secondary | ICD-10-CM | POA: Diagnosis not present

## 2024-03-26 DIAGNOSIS — I469 Cardiac arrest, cause unspecified: Secondary | ICD-10-CM | POA: Diagnosis not present

## 2024-04-01 DEATH — deceased
